# Patient Record
Sex: Male | Born: 1955 | Race: White | Hispanic: No
Health system: Midwestern US, Community
[De-identification: ages and names within clinical notes are randomized; demographics above are authoritative.]

## PROBLEM LIST (undated history)

## (undated) DIAGNOSIS — E669 Obesity, unspecified: Secondary | ICD-10-CM

## (undated) DIAGNOSIS — S92919A Unspecified fracture of unspecified toe(s), initial encounter for closed fracture: Secondary | ICD-10-CM

## (undated) DIAGNOSIS — E119 Type 2 diabetes mellitus without complications: Secondary | ICD-10-CM

## (undated) DIAGNOSIS — E782 Mixed hyperlipidemia: Secondary | ICD-10-CM

## (undated) DIAGNOSIS — E1169 Type 2 diabetes mellitus with other specified complication: Secondary | ICD-10-CM

## (undated) HISTORY — DX: Mixed hyperlipidemia: E78.2

## (undated) HISTORY — DX: Type 2 diabetes mellitus with other specified complication: E11.69

## (undated) HISTORY — DX: Unspecified fracture of unspecified toe(s), initial encounter for closed fracture: S92.919A

## (undated) HISTORY — DX: Obesity, unspecified: E66.9

## (undated) HISTORY — DX: Type 2 diabetes mellitus without complications: E11.9

## (undated) HISTORY — PX: COLONOSCOPY: SHX174

---

## 2010-03-01 HISTORY — PX: HERNIA REPAIR: SHX51

## 2011-01-18 NOTE — Progress Notes (Signed)
Subjective:      Patient ID: Joseph Richardson is a 55 y.o. male.    HPI  Patient is a 55 y.o. male seen at the request of himself for evaluation of hemorrhoids and umbilical hernia  Patient does not have rectal pain. The pain has been present for 2 week(s). The pain has not been worsening. He does not have pain with a BM.  Patient does not have blood in his stool.  Bowel movements are hard  Patient does feel a mass.  The patient has not been treated with topical agents, suppositories and stool softeners.  There is not a history of rectal trauma.  The patient does not have a family history of rectal polyps or cancer.  The symptomsdo not effect everyday activities.  Patient is a 55 y.o. male  who also has an umbilical hernia. The has been present 5 year(s). . The patient has pain. Pain is rated as a 2  It's size is increasing. The pain is stable. Patient does not have nausea/vomiting. The hernia mass is not reducible. The hernia does not effect normal everyday activities. The patient has had no abdominal surgery. A CAT scan was not done.      Review of Systems   Constitutional: Negative.  Negative for activity change, appetite change and unexpected weight change.   HENT: Negative.  Negative for nosebleeds, congestion, rhinorrhea, sneezing and postnasal drip.    Eyes: Negative.  Negative for visual disturbance.   Respiratory: Negative.  Negative for chest tightness and shortness of breath.    Cardiovascular: Negative.  Negative for chest pain and leg swelling.   Gastrointestinal: Negative.  Negative for abdominal pain, constipation, blood in stool and abdominal distention.   Genitourinary: Negative.  Negative for difficulty urinating.   Musculoskeletal: Negative.  Negative for myalgias, arthralgias and gait problem.   Skin: Negative for color change.   Neurological: Negative for dizziness, light-headedness, numbness and headaches.   Hematological: Negative.  Does not bruise/bleed easily.   Psychiatric/Behavioral:  Negative.  Negative for sleep disturbance.     Past Medical History   Diagnosis Date   . Sleep apnea      C PAP   . Tinea      Past Surgical History   Procedure Date   . Other surgical history      cyst left eye benign     History   Substance Use Topics   . Smoking status: Former Smoker     Quit date: 01/17/1986   . Smokeless tobacco: Not on file   . Alcohol Use: Not on file     History reviewed. No pertinent family history.  Review of patient's allergies indicates no known allergies.  Current Outpatient Prescriptions   Medication Sig Dispense Refill   . clotrimazole-betamethasone (LOTRISONE) cream        . UNABLE TO FIND C PAP MACHINE        . Garlic TABS Take  by mouth.         Chilton Si Tea, Camillia sinensis, (GREEN TEA EXTRACT) 90 % LIQD by Does not apply route.         . vitamin D (CHOLECALCIFEROL) 1000 UNIT TABS tablet Take 1,000 Units by mouth daily.         . ALPHA LIPOIC ACID PO Take  by mouth.         Marland Kitchen FISH OIL by Does not apply route.         . RED YEAST RICE by Does  not apply route.         . cyanocobalamin 1000 MCG/ML injection Inject 1,000 mcg into the muscle every 7 days.         Marland Kitchen VITAMIN E by Does not apply route.         Marland Kitchen UNABLE TO FIND WOBAZENE        . Amino Acids (AMINO ACID PO) Take  by mouth.         . meclizine (ANTIVERT) 25 MG tablet          BP 140/78  Pulse 84  Resp 20  Ht 6\' 1"  (1.854 m)  Wt 254 lb (115.214 kg)  BMI 33.51 kg/m2     Objective:   Physical Exam   Constitutional: He is oriented to person, place, and time. He appears well-developed and well-nourished.   HENT:   Head: Normocephalic.   Eyes: Pupils are equal, round, and reactive to light.   Neck: Normal range of motion.   Cardiovascular: Normal rate and normal heart sounds.    Pulmonary/Chest: Effort normal and breath sounds normal.   Abdominal: Soft. Bowel sounds are normal. A hernia (incarcerated umbilical hernia) is present.   Genitourinary: Penis normal. Rectal exam shows external hemorrhoid.   Musculoskeletal: Normal  range of motion.   Neurological: He is alert and oriented to person, place, and time.   Skin: Skin is warm.   Psychiatric: He has a normal mood and affect.       Assessment:      Umbilical hernia  External hemorrhoid      Plan:      Umbilical hernia repair    The options of therapy were discussed with the patient and He wants to proceed with the repair. The procedure of the repair with the probable use of mesh was discussed. The potential risks and complications including but not exclusive to infection, blood loss, chronic pain and recurrence were discussed. If any of these occur it could require another surgery. The expected recovery was discussed. It was be scheduled at His convenience.    Hemorrhoidectomy  I have discussed in great detail with the patient the options of therapy.The procedure of a hemorrhoidectomy as well as the potential risks and complications including but not exclusive to infection, blood loss, anal incontinence, anal seepage and recurrence were discussed. All of His questions were answered. He is agreeable to the procedure.

## 2011-01-26 LAB — BASIC METABOLIC PANEL
Anion Gap: 10 mEq/L (ref 7–13)
BUN: 23 mg/dL (ref 10–26)
CO2: 25 mEq/L (ref 22–32)
Calcium: 8.9 mg/dL (ref 8.5–10.2)
Chloride: 102 mEq/L (ref 99–111)
Creatinine: 1.06 mg/dL (ref 0.70–1.40)
GFR African American: 87.1
GFR Non-African American: 72
Glucose: 106 mg/dL (ref 60–115)
Potassium: 4.1 mEq/L (ref 3.5–5.5)
Sodium: 137 mEq/L (ref 135–146)

## 2011-01-26 LAB — CBC
Hematocrit: 41.1 % — ABNORMAL LOW (ref 42.0–52.0)
Hemoglobin: 13.7 g/dL — ABNORMAL LOW (ref 14.0–18.0)
MCH: 28.7 pg (ref 27.0–31.3)
MCHC: 33.5 % (ref 33.0–37.0)
MCV: 85.7 fL (ref 80.0–100.0)
MPV: 10.6 fL — ABNORMAL HIGH (ref 7.4–10.4)
Platelets: 165 10*3/uL (ref 130–400)
RBC: 4.79 10*6/uL (ref 4.70–6.10)
RDW: 14.1 % (ref 11.5–14.5)
WBC: 7.7 10*3/uL (ref 4.8–10.8)

## 2011-02-08 MED ORDER — OXYCODONE-ACETAMINOPHEN 5-325 MG PO TABS
5-325 MG | ORAL_TABLET | ORAL | Status: AC
Start: 2011-02-08 — End: 2011-02-15

## 2011-02-08 NOTE — Telephone Encounter (Signed)
done

## 2011-02-08 NOTE — Telephone Encounter (Signed)
Patient aware rx ready

## 2011-02-08 NOTE — Telephone Encounter (Signed)
rx did not print please re do

## 2011-02-08 NOTE — Telephone Encounter (Signed)
Patient requesting a refill on percocet

## 2011-02-11 MED ORDER — NYSTATIN 100000 UNIT/GM EX CREA
100000 UNIT/GM | CUTANEOUS | Status: DC
Start: 2011-02-11 — End: 2016-11-14

## 2011-02-11 NOTE — Progress Notes (Signed)
Subjective:      Patient ID: Joseph Richardson is a 55 y.o. male.    HPI  Joseph Richardson had a hemorrhoidectomy and umbilical hernia repair on 01/29/2011. The pain is rated as 4. Pain is improving. He has had soft bowel movements. Post operative bleeding is not present. He has returned to normal activities. He is concerned about his umbilical swelling.    Review of Systems    Objective:   Physical Exam   Constitutional: He is oriented to person, place, and time. He appears well-developed and well-nourished. No distress.   Cardiovascular: Normal rate.    Pulmonary/Chest: Effort normal.   Abdominal: Soft. Bowel sounds are normal.       Genitourinary:         Neurological: He is alert and oriented to person, place, and time.   Skin: Skin is warm.   Psychiatric: He has a normal mood and affect.       Assessment:      Satisfactory course       Plan:      The patient is instructed to return to see me in 1 month.

## 2011-02-15 MED ORDER — ZOLPIDEM TARTRATE 5 MG PO TABS
5 MG | ORAL_TABLET | ORAL | Status: DC
Start: 2011-02-15 — End: 2016-11-14

## 2011-02-15 NOTE — Telephone Encounter (Signed)
Patient states he is having trouble sleeping and that you told him you would give him something to help he would like this rx called to [facility] [phone]

## 2011-02-15 NOTE — Telephone Encounter (Signed)
rx called to pharm patient aware

## 2011-02-15 NOTE — Telephone Encounter (Signed)
done

## 2011-02-16 MED ORDER — LIDOCAINE (ANORECTAL) 5 % EX CREA
5 % | CUTANEOUS | Status: DC
Start: 2011-02-16 — End: 2016-11-14

## 2011-02-16 NOTE — Telephone Encounter (Signed)
Patient would like a refill on lidocaine cream

## 2011-02-16 NOTE — Telephone Encounter (Signed)
Done

## 2011-02-17 NOTE — Telephone Encounter (Signed)
Patient aware

## 2011-03-09 NOTE — Telephone Encounter (Signed)
Patient would like to know if it is ok for him to start exercising again "doing things like sit ups"? Please advise.

## 2011-03-10 NOTE — Telephone Encounter (Signed)
yes

## 2011-03-10 NOTE — Telephone Encounter (Signed)
Patient aware

## 2014-02-25 DIAGNOSIS — K5730 Diverticulosis of large intestine without perforation or abscess without bleeding: Secondary | ICD-10-CM | POA: Insufficient documentation

## 2016-11-12 ENCOUNTER — Emergency Department: Admit: 2016-11-12 | Payer: PRIVATE HEALTH INSURANCE

## 2016-11-12 ENCOUNTER — Inpatient Hospital Stay
Admission: EM | Admit: 2016-11-12 | Discharge: 2016-11-14 | Disposition: A | Discharge status: Home / Self Care | Payer: PRIVATE HEALTH INSURANCE | Source: Other Acute Inpatient Hospital | Admitting: Internal Medicine

## 2016-11-12 DIAGNOSIS — L03116 Cellulitis of left lower limb: Principal | ICD-10-CM

## 2016-11-12 LAB — CBC WITH AUTO DIFFERENTIAL
Basophils %: 0.6 %
Basophils Absolute: 0.1 10*3/uL (ref 0.0–0.2)
Eosinophils %: 0.6 %
Eosinophils Absolute: 0.1 10*3/uL (ref 0.0–0.7)
Hematocrit: 43.9 % (ref 42.0–52.0)
Hemoglobin: 15.2 g/dL (ref 14.0–18.0)
Lymphocytes %: 7.6 %
Lymphocytes Absolute: 0.9 10*3/uL — ABNORMAL LOW (ref 1.0–4.8)
MCH: 29.1 pg (ref 27.0–31.3)
MCHC: 34.6 % (ref 33.0–37.0)
MCV: 84.2 fL (ref 80.0–100.0)
Monocytes %: 8.5 %
Monocytes Absolute: 1 10*3/uL — ABNORMAL HIGH (ref 0.2–0.8)
Neutrophils %: 82.7 %
Neutrophils Absolute: 9.6 10*3/uL — ABNORMAL HIGH (ref 1.4–6.5)
Platelets: 146 10*3/uL (ref 130–400)
RBC: 5.22 M/uL (ref 4.70–6.10)
RDW: 13.8 % (ref 11.5–14.5)
WBC: 11.6 10*3/uL — ABNORMAL HIGH (ref 4.8–10.8)

## 2016-11-12 LAB — COMPREHENSIVE METABOLIC PANEL
ALT: 26 U/L (ref 0–41)
AST: 15 U/L (ref 0–40)
Albumin: 4.5 g/dL (ref 3.9–4.9)
Alkaline Phosphatase: 64 U/L (ref 35–104)
Anion Gap: 15 mEq/L — ABNORMAL HIGH (ref 7–13)
BUN: 19 mg/dL (ref 8–23)
CO2: 21 mEq/L — ABNORMAL LOW (ref 22–29)
Calcium: 9.1 mg/dL (ref 8.6–10.2)
Chloride: 94 mEq/L — ABNORMAL LOW (ref 98–107)
Creatinine: 1.16 mg/dL (ref 0.70–1.20)
GFR African American: >60 (ref >60)
GFR Non-African American: >60 (ref >60)
Globulin: 2.5 g/dL (ref 2.3–3.5)
Glucose: 486 mg/dL (ref 74–109)
Potassium: 4.3 mEq/L (ref 3.5–5.1)
Sodium: 130 mEq/L — ABNORMAL LOW (ref 132–144)
Total Bilirubin: 1.4 mg/dL — ABNORMAL HIGH (ref 0.0–1.2)
Total Protein: 7 g/dL (ref 6.4–8.1)

## 2016-11-12 LAB — POCT VENOUS
Base Excess, Ven: -3 (ref <=3)
Calcium, Ionized: 1.08 mmol/L — ABNORMAL LOW (ref 1.12–1.32)
FIO2: 21
GFR African American: >60 (ref >60)
GFR Non-African American: 51 — AB (ref >60)
HCO3, Venous: 22.2 mmol/L — ABNORMAL LOW (ref 23.0–29.0)
Hemoglobin: 15.8 gm/dL (ref 13.5–17.5)
Lactate: 3.75 mmol/L — ABNORMAL HIGH (ref 0.40–2.00)
O2 Sat, Ven: 95 %
POC Chloride: 97 mEq/L — ABNORMAL LOW (ref 99–110)
POC Creatinine: 1.4 mg/dL — ABNORMAL HIGH (ref 0.8–1.3)
POC Glucose: 476 mg/dl — ABNORMAL HIGH (ref 60–115)
POC Hematocrit: 46 % (ref 41–53)
POC Potassium: 4 mEq/L (ref 3.5–5.1)
POC Sodium: 132 mEq/L — ABNORMAL LOW (ref 136–145)
TC02 (Calc), Ven: 23 mmol/L
pCO2, Ven: 35.7 mm Hg — ABNORMAL LOW (ref 40.0–50.0)
pH, Ven: 7.402 (ref 7.350–7.450)
pO2, Ven: 76 mm Hg

## 2016-11-12 LAB — URINALYSIS WITH REFLEX TO CULTURE
Bilirubin Urine: NEGATIVE
Blood, Urine: NEGATIVE
Glucose, Ur: >=1000 mg/dL — AB
Ketones, Urine: NEGATIVE mg/dL
Leukocyte Esterase, Urine: NEGATIVE
Nitrite, Urine: NEGATIVE
Protein, UA: NEGATIVE mg/dL
Specific Gravity, UA: 1.03 (ref 1.005–1.030)
Urobilinogen, Urine: 0.2 E.U./dL (ref <2.0)
pH, UA: 5 (ref 5.0–9.0)

## 2016-11-12 LAB — PROTIME-INR
INR: 1
Protime: 10.1 s (ref 9.6–12.3)

## 2016-11-12 LAB — BETA-HYDROXYBUTYRATE: Beta-Hydroxybutyrate: 3.5 mg/dL — ABNORMAL HIGH (ref 0.2–2.8)

## 2016-11-12 LAB — APTT: aPTT: 27.3 s (ref 21.6–35.4)

## 2016-11-12 LAB — LACTIC ACID: Lactic Acid: 1.4 mmol/L (ref 0.5–2.2)

## 2016-11-12 MED ORDER — ENOXAPARIN SODIUM 120 MG/0.8ML SC SOLN
120 MG/0.8ML | Freq: Once | SUBCUTANEOUS | Status: AC
Start: 2016-11-12 — End: 2016-11-12
  Administered 2016-11-12: 23:00:00 120 mg/kg via SUBCUTANEOUS

## 2016-11-12 MED ORDER — CEFTRIAXONE SODIUM 1 G IJ SOLR
1 g | Freq: Once | INTRAMUSCULAR | Status: AC
Start: 2016-11-12 — End: 2016-11-12
  Administered 2016-11-12: 23:00:00 1 g via INTRAVENOUS

## 2016-11-12 MED ORDER — SODIUM CHLORIDE 0.9 % IV BOLUS
0.9 % | Freq: Once | INTRAVENOUS | Status: DC
Start: 2016-11-12 — End: 2016-11-14

## 2016-11-12 MED ORDER — ACETAMINOPHEN 500 MG PO TABS
500 MG | Freq: Once | ORAL | Status: AC
Start: 2016-11-12 — End: 2016-11-12
  Administered 2016-11-12: 20:00:00 1000 mg via ORAL

## 2016-11-12 MED ORDER — SODIUM CHLORIDE 0.9 % IV BOLUS
0.9 % | Freq: Once | INTRAVENOUS | Status: AC
Start: 2016-11-12 — End: 2016-11-13
  Administered 2016-11-12 (×2): 1000 mL via INTRAVENOUS

## 2016-11-12 MED ORDER — INSULIN REGULAR HUMAN 100 UNIT/ML IJ SOLN
100 UNIT/ML | Freq: Once | INTRAMUSCULAR | Status: AC
Start: 2016-11-12 — End: 2016-11-12
  Administered 2016-11-12: 22:00:00 12 [IU] via INTRAVENOUS

## 2016-11-12 MED FILL — ENOXAPARIN SODIUM 120 MG/0.8ML SC SOLN: 120 MG/0.8ML | SUBCUTANEOUS | Qty: 0.8

## 2016-11-12 MED FILL — CEFTRIAXONE SODIUM 1 G IJ SOLR: 1 g | INTRAMUSCULAR | Qty: 1

## 2016-11-12 MED FILL — MAPAP 500 MG PO TABS: 500 MG | ORAL | Qty: 2

## 2016-11-12 MED FILL — SODIUM CHLORIDE 0.9 % IV SOLN: 0.9 % | INTRAVENOUS | Qty: 1000

## 2016-11-12 NOTE — Progress Notes (Signed)
Report called report to [facility]

## 2016-11-12 NOTE — ED Notes (Signed)
To x-ray via cart.No acute distress noted.      Joseph Richardson  11/12/16 1900

## 2016-11-12 NOTE — Progress Notes (Signed)
Patient placed on CPAP at this time.

## 2016-11-12 NOTE — Progress Notes (Signed)
Notified lab concerning blood culture second draw and lactic acid

## 2016-11-12 NOTE — H&P (Signed)
Hospital Medicine History & Physical      PCP: No primary care provider on file.    Date of Admission: 11/12/2016    Date of Service: 11/12/16      Chief Complaint:  Left leg pain and swelling      History Of Present Illness:  61 y.o. male who [facility] for complaints of left lower extremity pain and swelling. [place] from [place] and drove to [place]. He states that he became concerned of having a possible blood clot after traveling and experiencing the pain. While in the ED he was found to have a low grade fever and to be tachycardic at times. The patient was also found to have an elevated blood sugar in the ED, but states that he ate a bagel with all the bad stuff before coming in. Denies cp sob ha rash anx n v d    Past Medical History:          Diagnosis Date   . Diabetes mellitus (HCC)    . Sleep apnea     C PAP   . Tinea        Past Surgical History:          Procedure Laterality Date   . HEMORRHOID SURGERY  01/29/11    excision of external hemorrhoid   . HERNIA REPAIR  01/29/11    Umbilical Hernia   . INGUINAL HERNIA REPAIR Bilateral    . OTHER SURGICAL HISTORY      cyst left eye benign       Medications Prior to Admission:      Prior to Admission medications    Medication Sig Start Date End Date Taking? Authorizing Provider   METFORMIN HCL PO Take by mouth 2 times daily   Yes Historical Provider, MD   Chilton SiGreen Tea, Camillia sinensis, (GREEN TEA EXTRACT) 90 % LIQD by Does not apply route.     Yes Historical Provider, MD   FISH OIL by Does not apply route.     Yes Historical Provider, MD   Lidocaine, Anorectal, 5 % CREA APPLY TID PRN 02/16/11   Janace HoardJames Andrasko, MD   zolpidem (AMBIEN) 5 MG tablet 1 po qhs prn sleep 02/15/11   Janace HoardJames Andrasko, MD   nystatin (MYCOSTATIN) cream Apply topically 2 times daily. 02/11/11   Janace HoardJames Andrasko, MD   clotrimazole-betamethasone Thurmond Butts(LOTRISONE) cream  01/06/11   Historical Provider, MD   meclizine (ANTIVERT) 25 MG tablet  01/11/11   Historical Provider, MD    UNABLE TO FIND C PAP MACHINE     Historical Provider, MD   Garlic TABS Take  by mouth.      Historical Provider, MD   vitamin D (CHOLECALCIFEROL) 1000 UNIT TABS tablet Take 1,000 Units by mouth daily.      Historical Provider, MD   ALPHA LIPOIC ACID PO Take  by mouth.      Historical Provider, MD   RED YEAST RICE by Does not apply route.      Historical Provider, MD   cyanocobalamin 1000 MCG/ML injection Inject 1,000 mcg into the muscle every 7 days.      Historical Provider, MD   VITAMIN E by Does not apply route.      Historical Provider, MD   [facility]     Historical Provider, MD   Amino Acids (AMINO ACID PO) Take  by mouth.      Historical Provider, MD  Allergies:  Patient has no known allergies.    Social History:      The patient currently lives home    TOBACCO:   reports that he quit smoking about 30 years ago. He has never used smokeless tobacco.  ETOH:   reports that he does not drink alcohol.      Family History:      Positive as follows: htn        REVIEW OF SYSTEMS:   Pertinent positives as noted in the HPI. All other systems reviewed and negative.    PHYSICAL EXAM:    BP (!) 148/79   Pulse 77   Temp 99 F (37.2 C)   Resp 18   Ht  (1.854 m)   Wt 270 lb (122.5 kg)   SpO2 95%   BMI 35.62 kg/m     General appearance:  No apparent distress, appears stated age and cooperative.  HEENT:  Normal cephalic, atraumatic without obvious deformity. Pupils equal, round, and reactive to light.  Extra ocular muscles intact. Conjunctivae/corneas clear.  Neck: Supple, with full range of motion. No jugular venous distention. Trachea midline.  Respiratory:  Normal respiratory effort. Clear to auscultation, bilaterally without Rales/Wheezes/Rhonchi.  Cardiovascular:  Regular rate and rhythm with normal S1/S2 without murmurs, rubs or gallops.  Abdomen: Soft, non-tender, non-distended with normal bowel sounds.  Musculoskeletal:  No clubbing, cyanosis or edema bilaterally.  Full range of motion  without deformity.  Skin: Skin color, texture, turgor normal.  No rashes or lesions.  Neurologic:  Neurovascularly intact without any focal sensory/motor deficits. Cranial nerves: II-XII intact, grossly non-focal.  Psychiatric:  Alert and oriented, thought content appropriate, normal insight  Capillary Refill: Brisk,< 3 seconds   Peripheral Pulses: +2 palpable, equal bilaterally       Labs:     Recent Labs      11/12/16   1600  11/12/16   1932   WBC  11.6*   --    HGB  15.2  15.8   HCT  43.9   --    PLT  146   --      Recent Labs      11/12/16   1600  11/12/16   1932   NA  130*   --    K  4.3   --    CL  94*   --    CO2  21*   --    BUN  19   --    CREATININE  1.16  1.4*   CALCIUM  9.1   --      Recent Labs      11/12/16   1600   AST  15   ALT  26   BILITOT  1.4*   ALKPHOS  64     Recent Labs      11/12/16   1639   INR  1.0     No results for input(s): CKTOTAL, TROPONINI in the last 72 hours.    Urinalysis:      Lab Results   Component Value Date    NITRU Negative 11/12/2016    BLOODU Negative 11/12/2016    SPECGRAV 1.030 11/12/2016    GLUCOSEU >=1000 11/12/2016       Radiology:     CXR: I have reviewed the CXR with the following interpretation: pending  EKG:  I have reviewed the EKG with the following interpretation: pending    XR CHEST STANDARD (2 VW)   Final  Result      No acute intrathoracic process.      [place] DUP LOWER EXTREMITY LEFT VEN   Final Result   NO SONOGRAPHIC EVIDENCE OF DEEP VENOUS THROMBOSIS WITHIN THE  LEFT  LOWER EXTREMITY.              ASSESSMENT:    Active Hospital Problems    Diagnosis Date Noted   . Hyperglycemia [R73.9] 11/12/2016       PLAN:        DVT Prophylaxis: lovenox see dosing  Diet:    Code Status: No Order    PT/OT Eval Status: eval and tx    1. Left leg edema - D-dimer and DVT US negative. Not DVT. Presented with erythema in the lateral aspect close to the ankle and posterior. He had a fever of 102F. Likely cellulitis. Will start empiric antibiotics. Will consult ID and follow up BG    2. Lactic acidosis - will continue IVF and monitor    3. Hyperglycemia-will place on accuchecks with ss coverage, dm diet. Will check A1C and consult endocrinology   4. Hyponatremia-Due to hyperglycemia - pseudohyponatremia   5. Leukocytosis - likely due to the cellulitis. Will monitor and continue antibiotics.  6. Elevated total bilirubin - will check direct and indirect. Will do [place] if needed.        Maceo Pro, PA    Thank you No primary care provider on file. for the opportunity to be involved in this patient's care. If you have any questions or concerns please feel free to contact me.     Attending's Note:  Pt was seen and evaluated and discussed care with PA.    Assessment and plan was corrected.    Florence Canner, MD  [facility] Medicine

## 2016-11-12 NOTE — ED Provider Notes (Signed)
[facility] ED  eMERGENCY dEPARTMENT eNCOUnter      Pt Name: Joseph Richardson  MRN: [id number]  Birthdate 1955-06-24  Date of evaluation: 11/12/2016  Provider: Wallace Cullens, APRN - CNP      HISTORY OF PRESENT ILLNESS    Broderick Fonseca is a 61 y.o. male who presents to the Emergency Department with L L leg pain x 1 day.  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  He denies any injury or trauma.  He also has been feeling very thirsty and having urinary frequency.  He is a type 2 diabetic and is taking Metformin.  Otherwise states he has no other complaints at this time.         REVIEW OF SYSTEMS       Review of Systems   Constitutional: Negative for activity change, appetite change and fever.   HENT: Negative for congestion, ear pain and sore throat.    Respiratory: Negative for cough and shortness of breath.    Cardiovascular: Negative for chest pain.   Gastrointestinal: Negative for abdominal pain, diarrhea, nausea and vomiting.   Endocrine: Positive for polydipsia and polyphagia.   Genitourinary: Negative for dysuria.   Musculoskeletal: Negative for arthralgias and back pain.        LLE pain, swelling, tenderness.   Skin: Negative for rash.   All other systems reviewed and are negative.        PAST MEDICAL HISTORY     Past Medical History:   Diagnosis Date   . Diabetes mellitus (HCC)    . Sleep apnea     C PAP   . Tinea          SURGICAL HISTORY       Past Surgical History:   Procedure Laterality Date   . HEMORRHOID SURGERY  01/29/11    excision of external hemorrhoid   . HERNIA REPAIR  01/29/11    Umbilical Hernia   . INGUINAL HERNIA REPAIR Bilateral    . OTHER SURGICAL HISTORY      cyst left eye benign         CURRENT MEDICATIONS       Previous Medications    ALPHA LIPOIC ACID PO    Take  by mouth.      AMINO ACIDS (AMINO ACID PO)    Take  by mouth.      CLOTRIMAZOLE-BETAMETHASONE (LOTRISONE) CREAM        CYANOCOBALAMIN 1000 MCG/ML INJECTION    Inject 1,000 mcg  into the muscle every 7 days.      FISH OIL    by Does not apply route.      GARLIC TABS    Take  by mouth.      GREEN TEA, CAMILLIA SINENSIS, (GREEN TEA EXTRACT) 90 % LIQD    by Does not apply route.      LIDOCAINE, ANORECTAL, 5 % CREA    APPLY TID PRN    MECLIZINE (ANTIVERT) 25 MG TABLET        METFORMIN HCL PO    Take by mouth 2 times daily    NYSTATIN (MYCOSTATIN) CREAM    Apply topically 2 times daily.    RED YEAST RICE    by Does not apply route.      UNABLE TO FIND    C PAP MACHINE     UNABLE TO FIND    WOBAZENE     VITAMIN D (CHOLECALCIFEROL)  1000 UNIT TABS TABLET    Take 1,000 Units by mouth daily.      VITAMIN E    by Does not apply route.      ZOLPIDEM (AMBIEN) 5 MG TABLET    1 po qhs prn sleep       ALLERGIES     Patient has no known allergies.    FAMILY HISTORY     History reviewed. No pertinent family history.       SOCIAL HISTORY       Social History     Social History   . Marital status: Divorced     Spouse name: N/A   . Number of children: N/A   . Years of education: N/A     Social History Main Topics   . Smoking status: Former Smoker     Quit date: 01/17/1986   . Smokeless tobacco: Never Used   . Alcohol use No   . Drug use: No   . Sexual activity: Not Asked     Other Topics Concern   . None     Social History Narrative   . None       SCREENINGS             PHYSICAL EXAM    (up to 7 for level 4, 8 or more for level 5)     ED Triage Vitals [11/12/16 1539]   BP Temp Temp Source Pulse Resp SpO2 Height Weight   (!) 155/95 102 F (38.9 C) Oral 101 18 95 %  (1.854 m) 270 lb (122.5 kg)       Physical Exam   Constitutional: He is oriented to person, place, and time. He appears well-developed and well-nourished.   HENT:   Head: Normocephalic and atraumatic.   Right Ear: External ear normal.   Left Ear: External ear normal.   Mouth/Throat: Oropharynx is clear and moist.   Eyes: Pupils are equal, round, and reactive to light. Conjunctivae and EOM are normal.   Neck: Normal range of motion. Neck supple.    Cardiovascular: Normal rate and regular rhythm.    Pulmonary/Chest: Effort normal and breath sounds normal. He has no decreased breath sounds. He has no wheezes.   Abdominal: Soft. Bowel sounds are normal. He exhibits no distension. There is no tenderness.   Musculoskeletal: Normal range of motion.        Left lower leg: He exhibits tenderness and swelling.        Legs:  Neurological: He is alert and oriented to person, place, and time. He has normal reflexes.   Skin: Skin is warm and dry.   Psychiatric: Judgment normal.   Nursing note and vitals reviewed.        All other labs were within normal range or not returned as of this dictation.    EMERGENCY DEPARTMENT COURSE and DIFFERENTIAL DIAGNOSIS/MDM:   Vitals:    Vitals:    11/12/16 1539 11/12/16 1735   BP: (!) 155/95 (!) 148/79   Pulse: 101 77   Resp: 18 18   Temp: 102 F (38.9 C) 99 F (37.2 C)   TempSrc: Oral    SpO2: 95% 95%   Weight: 270 lb (122.5 kg)    Height:  (1.854 m)             61 yr old male with hyperglycemia, pyrexia and leukocytosis.  He will be admitted for observation to r/o DVT.  He was started on Lovenox in the  ER.  His hyperglycemis was treated and antibiotics were started.  Patient is comfortable in the room at time of admission.  Patient was seen by Dr. Glenna Durand and plan of care and disposition were agreed upon.         PROCEDURES:  Unless otherwise noted below, none     Procedures      FINAL IMPRESSION      1. Hyperglycemia    2. Fever in adult    3. Leukocytosis, unspecified type          DISPOSITION/PLAN   DISPOSITION Admitted To Observation 11/12/2016 07:59:58 PM          Wallace Cullens, APRN - CNP (electronically signed)  Attending Emergency Physician       Wallace Cullens, APRN - CNP  11/12/16 2024

## 2016-11-12 NOTE — ED Notes (Signed)
1 touch done by this ED Tech. 1 touch is 307 Deanna RN was notified.      Burt KnackScott Valor Quaintance  11/12/16 2125

## 2016-11-13 LAB — BASIC METABOLIC PANEL W/ REFLEX TO MG FOR LOW K
Anion Gap: 11 mEq/L (ref 7–13)
BUN: 14 mg/dL (ref 8–23)
CO2: 24 mEq/L (ref 22–29)
Calcium: 7.9 mg/dL — ABNORMAL LOW (ref 8.6–10.2)
Chloride: 99 mEq/L (ref 98–107)
Creatinine: 1.02 mg/dL (ref 0.70–1.20)
GFR African American: >60 (ref >60)
GFR Non-African American: >60 (ref >60)
Glucose: 270 mg/dL — ABNORMAL HIGH (ref 74–109)
Potassium reflex Magnesium: 4.1 mEq/L (ref 3.5–5.1)
Sodium: 134 mEq/L (ref 132–144)

## 2016-11-13 LAB — CBC WITH AUTO DIFFERENTIAL
Basophils %: 0.6 %
Basophils Absolute: 0 10*3/uL (ref 0.0–0.2)
Eosinophils %: 2 %
Eosinophils Absolute: 0.1 10*3/uL (ref 0.0–0.7)
Hematocrit: 38.1 % — ABNORMAL LOW (ref 42.0–52.0)
Hemoglobin: 13.1 g/dL — ABNORMAL LOW (ref 14.0–18.0)
Lymphocytes %: 14.8 %
Lymphocytes Absolute: 1.1 10*3/uL (ref 1.0–4.8)
MCH: 29.2 pg (ref 27.0–31.3)
MCHC: 34.3 % (ref 33.0–37.0)
MCV: 85.1 fL (ref 80.0–100.0)
Monocytes %: 10.9 %
Monocytes Absolute: 0.8 10*3/uL (ref 0.2–0.8)
Neutrophils %: 71.7 %
Neutrophils Absolute: 5.5 10*3/uL (ref 1.4–6.5)
Platelets: 122 10*3/uL — ABNORMAL LOW (ref 130–400)
RBC: 4.48 M/uL — ABNORMAL LOW (ref 4.70–6.10)
RDW: 13.9 % (ref 11.5–14.5)
WBC: 7.7 10*3/uL (ref 4.8–10.8)

## 2016-11-13 LAB — LACTIC ACID: Lactic Acid: 0.9 mmol/L (ref 0.5–2.2)

## 2016-11-13 LAB — POCT GLUCOSE
Glucose: 307 mg/dL
POC Glucose: 307 mg/dl — ABNORMAL HIGH (ref 60–115)
POC Glucose: 241 mg/dl — ABNORMAL HIGH (ref 60–115)
POC Glucose: 281 mg/dl — ABNORMAL HIGH (ref 60–115)
POC Glucose: 280 mg/dl — ABNORMAL HIGH (ref 60–115)

## 2016-11-13 LAB — HEMOGLOBIN A1C: Hemoglobin A1C: 12 % — ABNORMAL HIGH (ref 4.8–5.9)

## 2016-11-13 LAB — D-DIMER, QUANTITATIVE: D-Dimer, Quant: 0.31 mg/L FEU (ref 0.00–0.50)

## 2016-11-13 MED ORDER — VANCOMYCIN INTERMITTENT DOSING (PLACEHOLDER)
INTRAVENOUS | Status: DC
Start: 2016-11-13 — End: 2016-11-13

## 2016-11-13 MED ORDER — MAGNESIUM HYDROXIDE 400 MG/5ML PO SUSP
400 MG/5ML | Freq: Every day | ORAL | Status: DC | PRN
Start: 2016-11-13 — End: 2016-11-14

## 2016-11-13 MED ORDER — DEXTROSE 50 % IV SOLN
50 % | INTRAVENOUS | Status: DC | PRN
Start: 2016-11-13 — End: 2016-11-14

## 2016-11-13 MED ORDER — NORMAL SALINE FLUSH 0.9 % IV SOLN
0.9 % | INTRAVENOUS | Status: DC | PRN
Start: 2016-11-13 — End: 2016-11-14

## 2016-11-13 MED ORDER — GLUCOSE 40 % PO GEL
40 % | ORAL | Status: DC | PRN
Start: 2016-11-13 — End: 2016-11-14

## 2016-11-13 MED ORDER — DEXTROSE 5 % IV SOLN
5 % | Freq: Two times a day (BID) | INTRAVENOUS | Status: DC
Start: 2016-11-13 — End: 2016-11-13
  Administered 2016-11-13 (×2): 1500 mg/kg via INTRAVENOUS

## 2016-11-13 MED ORDER — DEXTROSE 5 % IV SOLN
5 % | INTRAVENOUS | Status: DC | PRN
Start: 2016-11-13 — End: 2016-11-14

## 2016-11-13 MED ORDER — IBUPROFEN 400 MG PO TABS
400 MG | Freq: Four times a day (QID) | ORAL | Status: DC | PRN
Start: 2016-11-13 — End: 2016-11-14
  Administered 2016-11-13 – 2016-11-14 (×2): 400 mg via ORAL

## 2016-11-13 MED ORDER — ONDANSETRON HCL 4 MG/2ML IJ SOLN
4 MG/2ML | Freq: Four times a day (QID) | INTRAMUSCULAR | Status: DC | PRN
Start: 2016-11-13 — End: 2016-11-14

## 2016-11-13 MED ORDER — GLUCAGON HCL RDNA (DIAGNOSTIC) 1 MG IJ SOLR
1 MG | INTRAMUSCULAR | Status: DC | PRN
Start: 2016-11-13 — End: 2016-11-14

## 2016-11-13 MED ORDER — SODIUM CHLORIDE 0.9 % IV SOLN
0.9 % | INTRAVENOUS | Status: DC
Start: 2016-11-13 — End: 2016-11-14
  Administered 2016-11-13 – 2016-11-14 (×2): via INTRAVENOUS

## 2016-11-13 MED ORDER — METFORMIN HCL 500 MG PO TABS
500 MG | Freq: Two times a day (BID) | ORAL | Status: DC
Start: 2016-11-13 — End: 2016-11-14
  Administered 2016-11-13 – 2016-11-14 (×2): 500 mg via ORAL

## 2016-11-13 MED ORDER — CEFTRIAXONE SODIUM 1 G IJ SOLR
1 g | INTRAMUSCULAR | Status: DC
Start: 2016-11-13 — End: 2016-11-12

## 2016-11-13 MED ORDER — PIPERACILLIN SOD-TAZOBACTAM SO 3.375 (3-0.375) G IV SOLR
3.375 (3-0.375) g | Freq: Three times a day (TID) | INTRAVENOUS | Status: DC
Start: 2016-11-13 — End: 2016-11-13
  Administered 2016-11-13 (×3): 3.375 g via INTRAVENOUS

## 2016-11-13 MED ORDER — INSULIN LISPRO 100 UNIT/ML SC SOLN
100 UNIT/ML | Freq: Three times a day (TID) | SUBCUTANEOUS | Status: DC
Start: 2016-11-13 — End: 2016-11-14
  Administered 2016-11-13: 12:00:00 4 [IU] via SUBCUTANEOUS
  Administered 2016-11-13 (×2): 6 [IU] via SUBCUTANEOUS
  Administered 2016-11-14: 12:00:00 4 [IU] via SUBCUTANEOUS
  Administered 2016-11-14 (×2): 6 [IU] via SUBCUTANEOUS

## 2016-11-13 MED ORDER — NORMAL SALINE FLUSH 0.9 % IV SOLN
0.9 % | Freq: Two times a day (BID) | INTRAVENOUS | Status: DC
Start: 2016-11-13 — End: 2016-11-14

## 2016-11-13 MED ORDER — ENOXAPARIN SODIUM 120 MG/0.8ML SC SOLN
120 MG/0.8ML | Freq: Two times a day (BID) | SUBCUTANEOUS | Status: DC
Start: 2016-11-13 — End: 2016-11-12

## 2016-11-13 MED ORDER — SODIUM CHLORIDE 0.9 % IV SOLN
0.9 % | INTRAVENOUS | Status: DC
Start: 2016-11-13 — End: 2016-11-12

## 2016-11-13 MED ORDER — INSULIN LISPRO 100 UNIT/ML SC SOLN
100 UNIT/ML | Freq: Every evening | SUBCUTANEOUS | Status: DC
Start: 2016-11-13 — End: 2016-11-14
  Administered 2016-11-13: 02:00:00 4 [IU] via SUBCUTANEOUS
  Administered 2016-11-14: 02:00:00 3 [IU] via SUBCUTANEOUS

## 2016-11-13 MED ORDER — IBUPROFEN 400 MG PO TABS
400 MG | Freq: Three times a day (TID) | ORAL | Status: DC | PRN
Start: 2016-11-13 — End: 2016-11-13
  Administered 2016-11-13: 12:00:00 400 mg via ORAL

## 2016-11-13 MED FILL — IBUPROFEN 400 MG PO TABS: 400 MG | ORAL | Qty: 1

## 2016-11-13 MED FILL — PIPERACILLIN SOD-TAZOBACTAM SO 3.375 (3-0.375) G IV SOLR: 3.375 (3-0.375) g | INTRAVENOUS | Qty: 3.38

## 2016-11-13 MED FILL — SODIUM CHLORIDE 0.9 % IV SOLN: 0.9 % | INTRAVENOUS | Qty: 1000

## 2016-11-13 MED FILL — VANCOMYCIN HCL 1000 MG IV SOLR: 1000 MG | INTRAVENOUS | Qty: 1500

## 2016-11-13 MED FILL — MONOJECT FLUSH SYRINGE 0.9 % IV SOLN: 0.9 % | INTRAVENOUS | Qty: 10

## 2016-11-13 MED FILL — VANCOMYCIN HCL 5000 MG IV SOLR: 5000 MG | INTRAVENOUS | Qty: 1500

## 2016-11-13 MED FILL — METFORMIN HCL 500 MG PO TABS: 500 MG | ORAL | Qty: 1

## 2016-11-13 NOTE — Consults (Signed)
Consults   Infectious Disease     Patient Name: Joseph Richardson  Date: 11/13/2016  Date of Birth: 07/18/1955  Medical Record Number: [id number]    Cellulitis left leg      History of Present Illness:    Patient presented to the hospital yesterday with left leg swelling pain  Fevers chills sweats ongoing  Sugar was elevated  No shortness of breath headache nausea vomiting diarrhea    Placed on ceftriaxone and vancomycin    Review of Systems   Constitutional: Negative.    HENT: Negative.    Respiratory: Negative.    Cardiovascular: Negative.    Gastrointestinal: Negative.    Musculoskeletal: Negative.    Skin: Negative.    Neurological: Negative.        Review of Systems: All 14 review of systems negative other than as stated above    Social History   Substance Use Topics   . Smoking status: Former Smoker     Quit date: 01/17/1986   . Smokeless tobacco: Never Used   . Alcohol use No         Past Medical History:   Diagnosis Date   . Diabetes mellitus (HCC)    . Sleep apnea     C PAP   . Tinea            Past Surgical History:   Procedure Laterality Date   . HEMORRHOID SURGERY  01/29/11    excision of external hemorrhoid   . HERNIA REPAIR  01/29/11    Umbilical Hernia   . INGUINAL HERNIA REPAIR Bilateral    . OTHER SURGICAL HISTORY      cyst left eye benign         No current facility-administered medications on file prior to encounter.      Current Outpatient Prescriptions on File Prior to Encounter   Medication Sig Dispense Refill   . Lidocaine, Anorectal, 5 % CREA APPLY TID PRN 1 Tube 3   . zolpidem (AMBIEN) 5 MG tablet 1 po qhs prn sleep 30 tablet 2   . nystatin (MYCOSTATIN) cream Apply topically 2 times daily. 1 Tube 3   . clotrimazole-betamethasone (LOTRISONE) cream      . meclizine (ANTIVERT) 25 MG tablet      . UNABLE TO FIND C PAP MACHINE      . Garlic TABS Take  by mouth.       Chilton Si Tea, Camillia sinensis, (GREEN TEA EXTRACT) 90 % LIQD by Does not apply route.       . vitamin D (CHOLECALCIFEROL) 1000 UNIT TABS  tablet Take 1,000 Units by mouth daily.       . ALPHA LIPOIC ACID PO Take  by mouth.       Marland Kitchen FISH OIL by Does not apply route.       . RED YEAST RICE by Does not apply route.       . cyanocobalamin 1000 MCG/ML injection Inject 1,000 mcg into the muscle every 7 days.       Marland Kitchen VITAMIN E by Does not apply route.       Marland Kitchen UNABLE TO FIND WOBAZENE      . Amino Acids (AMINO ACID PO) Take  by mouth.           No Known Allergies      History reviewed. No pertinent family history.      Physical Exam:     Blood pressure 126/66, pulse 64, temperature 99 F (  37.2 C), resp. rate 16, height 6\' 1"  (1.854 m), weight 270 lb (122.5 kg), SpO2 98 %.    Physical Exam   HENT:   Head: Normocephalic.   Right Ear: External ear normal.   Mouth/Throat: Oropharynx is clear and moist.   Neck: Normal range of motion. No JVD present. No tracheal deviation present. No thyromegaly present.   Cardiovascular: Normal rate, normal heart sounds and intact distal pulses.  Exam reveals no gallop.    No murmur heard.  Pulmonary/Chest: No respiratory distress. He has no wheezes. He has no rales. He exhibits no tenderness.   Abdominal: Bowel sounds are normal. He exhibits no distension and no mass. There is no hepatosplenomegaly, splenomegaly or hepatomegaly. There is no tenderness. There is no rebound, no guarding and no CVA tenderness.   Musculoskeletal: He exhibits edema and tenderness.   Lymphadenopathy:     He has no cervical adenopathy.     He has no axillary adenopathy.        Right: No inguinal, no supraclavicular and no epitrochlear adenopathy present.        Left: No inguinal, no supraclavicular and no epitrochlear adenopathy present.   Neurological: He is alert. He displays normal reflexes. No cranial nerve deficit. Coordination normal.   Skin: Skin is warm. Rash noted. There is erythema. No pallor.     Lab Results   Component Value Date    WBC 7.7 11/13/2016    HGB 13.1 (L) 11/13/2016    HCT 38.1 (L) 11/13/2016    MCV 85.1 11/13/2016    PLT 122 (L)  11/13/2016     Lab Results   Component Value Date    NA 134 11/13/2016    K 4.1 11/13/2016    CL 99 11/13/2016    CO2 24 11/13/2016    BUN 14 11/13/2016    CREATININE 1.02 11/13/2016    GLUCOSE 270 11/13/2016    CALCIUM 7.9 11/13/2016          Patient Active Problem List   Diagnosis   . Umbilical hernia   . External hemorrhoid, thrombosed   . Hyperglycemia         PLAN:    Cellulitis of left leg    Switch to ampicillin IV hopefully be able switch to oral amoxicillin tomorrow

## 2016-11-13 NOTE — Progress Notes (Signed)
Pharmacy Note  Vancomycin Consult    Joseph Richardson is a 61 y.o. male started on Vancomycin for cellulitis; consult received from Dr. Joette Catching to manage therapy. Also receiving the following antibiotics: Zosyn.    Patient Active Problem List   Diagnosis   . Umbilical hernia   . External hemorrhoid, thrombosed   . Hyperglycemia       Allergies:  Patient has no known allergies.     Temp max: 102F    Recent Labs      11/12/16   1600   BUN  19       Recent Labs      11/12/16   1600  11/12/16   1932   CREATININE  1.16  1.4*       Recent Labs      11/12/16   1600   WBC  11.6*       No intake or output data in the 24 hours ending 11/13/16 0006    Culture Date      Source                       Results  11/12/16              blood                           pending    Ht Readings from Last 1 Encounters:   11/12/16 6\' 1"  (1.854 m)        Wt Readings from Last 1 Encounters:   11/12/16 270 lb (122.5 kg)         Body mass index is 35.62 kg/m.    Estimated Creatinine Clearance: 75 mL/min (A) (based on SCr of 1.4 mg/dL (H)).    Goal Trough Level: 10-15 mcg/mL    Assessment/Plan:  Will initiate vancomycin 1500 mg IV every 12 hours based on adjusted body weight for BMI > 35. Trough prior to 4th dose 11/14/16 1200. Timing of trough level will be determined based on culture results, renal function, and clinical response.     Thank you for the consult.  Will continue to follow.    Burlene Arnt, R.Ph.  11/13/2016  12:08 AM

## 2016-11-13 NOTE — Plan of Care (Signed)
Problem: Discharge Planning:  Goal: Discharged to appropriate level of care  Discharged to appropriate level of care   Outcome: Ongoing      Problem: Body Temperature - Imbalanced:  Goal: Ability to maintain a body temperature in the normal range will improve  Ability to maintain a body temperature in the normal range will improve   Outcome: Ongoing      Problem: Mobility - Impaired:  Goal: Mobility will improve to maximum level  Mobility will improve to maximum level   Outcome: Ongoing      Problem: Pain:  Goal: Pain level will decrease  Pain level will decrease   Outcome: Ongoing    Goal: Control of acute pain  Control of acute pain   Outcome: Ongoing    Goal: Control of chronic pain  Control of chronic pain   Outcome: Ongoing      Problem: Skin Integrity - Impaired:  Goal: Will show no infection signs and symptoms  Will show no infection signs and symptoms   Outcome: Ongoing    Goal: Absence of new skin breakdown  Absence of new skin breakdown   Outcome: Ongoing

## 2016-11-13 NOTE — Progress Notes (Signed)
Department of Internal Medicine  Progress Note      SUBJECTIVE: complains of pain in left leg  No acute events overnight.       ROS:  All 12 systems reviewed and negative except mentioned in HPI and Assessment and plan    MEDICATIONS:  Current Facility-Administered Medications   Medication Dose Route Frequency Provider Last Rate Last Dose   . ibuprofen (ADVIL;MOTRIN) tablet 400 mg  400 mg Oral Q6H PRN Tylene Quashie, MD       . 0.9 % sodium chloride bolus  1,000 mL Intravenous Once Wallace Cullens, APRN - CNP       . sodium chloride flush 0.9 % injection 10 mL  10 mL Intravenous 2 times per day Clayborne Artist Dobritch, PA       . sodium chloride flush 0.9 % injection 10 mL  10 mL Intravenous PRN Clayborne Artist [place], PA       . magnesium hydroxide (MILK OF MAGNESIA) 400 MG/5ML suspension 30 mL  30 mL Oral Daily PRN Clayborne Artist [place], PA       . ondansetron [facility]) injection 4 mg  4 mg Intravenous Q6H PRN Clayborne Artist Dobrit[place], PA       . insulin lispro (HUMALOG) injection vial 0-12 Units  0-12 Units Subcutaneous TID [facility] Clayborne Artist Dobritch, PA   6 Units at 11/13/16 1227   . insulin lispro (HUMALOG) injection vial 0-6 Units  0-6 Units Subcutaneous Nightly Clayborne Artist [place], [place]   4 Units at 11/12/16 2130   . glucose (GLUTOSE) 40 % oral gel 15 g  15 g Oral PRN Florence Canner, MD       . dextrose 50 % solution 12.5 g  12.5 g Intravenous PRN Florence Canner, MD       . glucagon (rDNA) injection 1 mg  1 mg Intramuscular PRN Florence Canner, MD       . dextrose 5 % solution  100 mL/hr Intravenous PRN Florence Canner, MD       . vancomycin (VANCOCIN) 1,500 mg in dextrose 5 % 500 mL IVPB  15 mg/kg (Adjusted) Intravenous Q12H Florence Canner, MD   Stopped at 11/13/16 0106   . 0.9 % sodium chloride infusion   Intravenous Continuous Florence Canner, MD 100 mL/hr at 11/12/16 2336     . piperacillin-tazobactam (ZOSYN) 3.375 g in dextrose 5 % 50 mL IVPB extended infusion (mini-bag)  3.375 g Intravenous Q8H Florence Canner, MD 12.5 mL/hr  at 11/13/16 1024 3.375 g at 11/13/16 1024   . vancomycin (VANCOCIN) intermittent dosing (placeholder)   Other RX Placeholder Florence Canner, MD             OBJECTIVE:  Vital Signs:  Vitals:    11/13/16 0759   BP: 126/66   Pulse: 64   Resp:    Temp: 99 F (37.2 C)   SpO2: 98%       Focal exam:  Left leg red and tender and warm around ankles    General Exam (except as mentioned above):  CONSTITUTIONAL: Awake, alert, no apparent distress  EYES:  PERRL, conjunctiva normal  ENT:  Normocephalic, atraumatic  NECK:  Supple  BACK:  Symmetric  LUNGS:  CTAB except bilateral basilar crackles.  CARDIOVASCULAR:  S1S2 present  ABDOMEN:  soft, non-distended, non-tender  MUSCULOSKELETAL:  There is no redness, warmth, or swelling of the joints.  NEUROLOGIC:  Alert, awake, oriented x 3. No FND  EXTREMITIES: Warm and well perfused.     LABS  Recent Labs      11/12/16   1600  11/12/16   1932  11/13/16   0647   WBC  11.6*   --   7.7   RBC  5.22   --   4.48*   HGB  15.2  15.8  13.1*   HCT  43.9   --   38.1*   MCV  84.2   --   85.1   MCH  29.1   --   29.2   MCHC  34.6   --   34.3   RDW  13.8   --   13.9   PLT  146   --   122*       Recent Labs      11/12/16   1600  11/12/16   1932  11/12/16   2123  11/13/16   0647   NA  130*   --    --   134   K  4.3   --    --   4.1   CL  94*   --    --   99   CO2  21*   --    --   24   BUN  19   --    --   14   CREATININE  1.16  1.4*   --   1.02   GLUCOSE  486*   --   307  270*   CALCIUM  9.1   --    --   7.9*       No results for input(s): MG in the last 72 hours.        ASSESSMENT AND PLAN    Active Hospital Problems    Diagnosis Date Noted   . Hyperglycemia [R73.9] 11/12/2016     - Lef leg cellulitis: On Zosyn and vancomycin. 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 States, he was started on metformin 1 month ago. Does not follow card controlled diet. On SSI . Restarted metformin      DVT prophylaxis: Lovenox  Bary Castilla, MD  Pager : [phone]

## 2016-11-13 NOTE — Plan of Care (Signed)
Problem: Body Temperature - Imbalanced:  Goal: Ability to maintain a body temperature in the normal range will improve  Ability to maintain a body temperature in the normal range will improve  Outcome: Ongoing

## 2016-11-13 NOTE — Progress Notes (Signed)
Patient admission complete.  Cpap ordered as patient states he "will not sleep without bipap".  Got bipap from respiratory as his is not here, was not fitting right.  Respiratory came up and adjusted, seems to be fine now.  Vitals stable, patient steady gate to stand up and use urinal.  Will continue to monitor.

## 2016-11-14 DIAGNOSIS — L03116 Cellulitis of left lower limb: Secondary | ICD-10-CM

## 2016-11-14 LAB — CBC WITH AUTO DIFFERENTIAL
Basophils %: 0.6 %
Basophils Absolute: 0 10*3/uL (ref 0.0–0.2)
Eosinophils %: 2.2 %
Eosinophils Absolute: 0.1 10*3/uL (ref 0.0–0.7)
Hematocrit: 37.1 % — ABNORMAL LOW (ref 42.0–52.0)
Hemoglobin: 12.7 g/dL — ABNORMAL LOW (ref 14.0–18.0)
Lymphocytes %: 17.4 %
Lymphocytes Absolute: 1.2 10*3/uL (ref 1.0–4.8)
MCH: 29.2 pg (ref 27.0–31.3)
MCHC: 34.1 % (ref 33.0–37.0)
MCV: 85.6 fL (ref 80.0–100.0)
Monocytes %: 9.2 %
Monocytes Absolute: 0.6 10*3/uL (ref 0.2–0.8)
Neutrophils %: 70.6 %
Neutrophils Absolute: 4.7 10*3/uL (ref 1.4–6.5)
Platelets: 116 10*3/uL — ABNORMAL LOW (ref 130–400)
RBC: 4.34 M/uL — ABNORMAL LOW (ref 4.70–6.10)
RDW: 13.6 % (ref 11.5–14.5)
WBC: 6.7 10*3/uL (ref 4.8–10.8)

## 2016-11-14 LAB — BASIC METABOLIC PANEL W/ REFLEX TO MG FOR LOW K
Anion Gap: 12 mEq/L (ref 7–13)
BUN: 13 mg/dL (ref 8–23)
CO2: 23 mEq/L (ref 22–29)
Calcium: 7.9 mg/dL — ABNORMAL LOW (ref 8.6–10.2)
Chloride: 102 mEq/L (ref 98–107)
Creatinine: 1.01 mg/dL (ref 0.70–1.20)
GFR African American: >60 (ref >60)
GFR Non-African American: >60 (ref >60)
Glucose: 230 mg/dL — ABNORMAL HIGH (ref 74–109)
Potassium reflex Magnesium: 4.2 mEq/L (ref 3.5–5.1)
Sodium: 137 mEq/L (ref 132–144)

## 2016-11-14 LAB — POCT GLUCOSE
POC Glucose: 285 mg/dl — ABNORMAL HIGH (ref 60–115)
POC Glucose: 235 mg/dl — ABNORMAL HIGH (ref 60–115)
POC Glucose: 272 mg/dl — ABNORMAL HIGH (ref 60–115)
POC Glucose: 268 mg/dl — ABNORMAL HIGH (ref 60–115)

## 2016-11-14 LAB — LACTIC ACID: Lactic Acid: 0.8 mmol/L (ref 0.5–2.2)

## 2016-11-14 MED ORDER — INSULIN GLARGINE 100 UNIT/ML SC SOLN
100 UNIT/ML | Freq: Every evening | SUBCUTANEOUS | Status: DC
Start: 2016-11-14 — End: 2016-11-14

## 2016-11-14 MED ORDER — MELATONIN 3 MG PO TABS
3 MG | Freq: Every evening | ORAL | Status: DC | PRN
Start: 2016-11-14 — End: 2016-11-14
  Administered 2016-11-14: 03:00:00 10 mg via ORAL

## 2016-11-14 MED ORDER — INSULIN LISPRO 100 UNIT/ML SC SOLN
100 UNIT/ML | Freq: Three times a day (TID) | SUBCUTANEOUS | Status: DC
Start: 2016-11-14 — End: 2016-11-14
  Administered 2016-11-14: 21:00:00 15 [IU] via SUBCUTANEOUS

## 2016-11-14 MED ORDER — INSULIN LISPRO 100 UNIT/ML SC SOPN
100 UNIT/ML | PEN_INJECTOR | Freq: Three times a day (TID) | SUBCUTANEOUS | 3 refills | Status: AC
Start: 2016-11-14 — End: ?

## 2016-11-14 MED ORDER — METFORMIN HCL 1000 MG PO TABS
1000 | ORAL_TABLET | Freq: Two times a day (BID) | ORAL | 0 refills | Status: AC
Start: 2016-11-14 — End: ?

## 2016-11-14 MED ORDER — AMPICILLIN SODIUM 2000 MG IJ SOLR (MIXTURES ONLY)
2 G | Freq: Four times a day (QID) | INTRAVENOUS | Status: DC
Start: 2016-11-14 — End: 2016-11-14
  Administered 2016-11-14 (×4): 2 g via INTRAVENOUS

## 2016-11-14 MED ORDER — BLOOD GLUCOSE TEST VI STRP
ORAL_STRIP | Freq: Four times a day (QID) | 0 refills | Status: AC
Start: 2016-11-14 — End: ?

## 2016-11-14 MED ORDER — LANCETS MISC
Freq: Four times a day (QID) | 3 refills | Status: AC
Start: 2016-11-14 — End: ?

## 2016-11-14 MED ORDER — INSULIN GLARGINE 100 UNIT/ML SC SOPN
100 UNIT/ML | PEN_INJECTOR | Freq: Every evening | SUBCUTANEOUS | 3 refills | Status: AC
Start: 2016-11-14 — End: ?

## 2016-11-14 MED ORDER — METFORMIN HCL 500 MG PO TABS
500 MG | Freq: Two times a day (BID) | ORAL | Status: DC
Start: 2016-11-14 — End: 2016-11-14
  Administered 2016-11-14: 21:00:00 1000 mg via ORAL

## 2016-11-14 MED ORDER — BLOOD GLUCOSE MONITORING SUPPL DEVI
PACK | Freq: Four times a day (QID) | 0 refills | Status: AC
Start: 2016-11-14 — End: ?

## 2016-11-14 MED ORDER — AMOXICILLIN 500 MG PO CAPS
500 MG | ORAL_CAPSULE | Freq: Three times a day (TID) | ORAL | 0 refills | Status: AC
Start: 2016-11-14 — End: 2016-11-24

## 2016-11-14 MED FILL — AMPICILLIN SODIUM 2 G IJ SOLR: 2 g | INTRAMUSCULAR | Qty: 2

## 2016-11-14 MED FILL — MELATONIN 1 MG PO TABS: 1 MG | ORAL | Qty: 1

## 2016-11-14 MED FILL — METFORMIN HCL 500 MG PO TABS: 500 MG | ORAL | Qty: 1

## 2016-11-14 MED FILL — LANTUS 100 UNIT/ML SC SOLN: 100 UNIT/ML | SUBCUTANEOUS | Qty: 20

## 2016-11-14 MED FILL — SODIUM CHLORIDE 0.9 % IV SOLN: 0.9 % | INTRAVENOUS | Qty: 1000

## 2016-11-14 MED FILL — METFORMIN HCL 500 MG PO TABS: 500 MG | ORAL | Qty: 2

## 2016-11-14 MED FILL — IBUPROFEN 400 MG PO TABS: 400 MG | ORAL | Qty: 1

## 2016-11-14 NOTE — Progress Notes (Signed)
Department of Internal Medicine  Progress Note      SUBJECTIVE: Voices improvement in left leg pain. Afebrile and HDS.  No acute events overnight.       ROS:  All 12 systems reviewed and negative except mentioned in HPI and Assessment and plan    MEDICATIONS:  Current Facility-Administered Medications   Medication Dose Route Frequency Provider Last Rate Last Dose   . metFORMIN (GLUCOPHAGE) tablet 1,000 mg  1,000 mg Oral BID WC Durwood Dittus, MD       . ibuprofen (ADVIL;MOTRIN) tablet 400 mg  400 mg Oral Q6H PRN Daivd Fredericksen, MD   400 mg at 11/14/16 0817   . ampicillin 2 g ivpb mini bag  2 g Intravenous 4 times per day Lollie Sails, MD   Stopped at 11/14/16 1148   . melatonin tablet 10 mg  10 mg Oral Nightly PRN Florence Canner, MD   10 mg at 11/13/16 2311   . 0.9 % sodium chloride bolus  1,000 mL Intravenous Once Wallace Cullens, APRN - CNP       . sodium chloride flush 0.9 % injection 10 mL  10 mL Intravenous 2 times per day Clayborne Artist Dobritch, PA       . sodium chloride flush 0.9 % injection 10 mL  10 mL Intravenous PRN Clayborne Artist Dobritch, PA       . magnesium hydroxide (MILK OF MAGNESIA) 400 MG/5ML suspension 30 mL  30 mL Oral Daily PRN Clayborne Artist Dobritch, PA       . ondansetron [facility]) injection 4 mg  4 mg Intravenous Q6H PRN Clayborne Artist Dobritch, PA       . insulin lispro (HUMALOG) injection vial 0-12 Units  0-12 Units Subcutaneous TID [facility] Clayborne Artist Dobritch, PA   6 Units at 11/14/16 1203   . insulin lispro (HUMALOG) injection vial 0-6 Units  0-6 Units Subcutaneous Nightly Clayborne Artist [place], [place]   3 Units at 11/13/16 2219   . glucose (GLUTOSE) 40 % oral gel 15 g  15 g Oral PRN Florence Canner, MD       . dextrose 50 % solution 12.5 g  12.5 g Intravenous PRN Florence Canner, MD       . glucagon (rDNA) injection 1 mg  1 mg Intramuscular PRN Florence Canner, MD       . dextrose 5 % solution  100 mL/hr Intravenous PRN Florence Canner, MD       . 0.9 % sodium chloride infusion   Intravenous Continuous Florence Canner, MD 100 mL/hr at 11/13/16 2157           OBJECTIVE:  Vital Signs:  Vitals:    11/14/16 0752   BP: 138/71   Pulse: 62   Resp:    Temp: 98.8 F (37.1 C)   SpO2: 96%       Focal exam:      General Exam (except as mentioned above):  CONSTITUTIONAL: Awake, alert, no apparent distress  EYES:  PERRL, conjunctiva normal  ENT:  Normocephalic, atraumatic  NECK:  Supple  BACK:  Symmetric  LUNGS:  CTAB except bilateral basilar crackles.  CARDIOVASCULAR:  S1S2 present  ABDOMEN:  soft, non-distended, non-tender  MUSCULOSKELETAL:  There is no redness, warmth, or swelling of the joints.  NEUROLOGIC:  Alert, awake, oriented x 3. No FND  EXTREMITIES: Warm and well perfused.     LABS  Recent Labs      11/12/16   1600  11/12/16  1932  11/13/16   0647  11/14/16   0648   WBC  11.6*   --   7.7  6.7   RBC  5.22   --   4.48*  4.34*   HGB  15.2  15.8  13.1*  12.7*   HCT  43.9   --   38.1*  37.1*   MCV  84.2   --   85.1  85.6   MCH  29.1   --   29.2  29.2   MCHC  34.6   --   34.3  34.1   RDW  13.8   --   13.9  13.6   PLT  146   --   122*  116*       Recent Labs      11/12/16   1600  11/12/16   1932  11/12/16   2123  11/13/16   0647  11/14/16   0647   NA  130*   --    --   134  137   K  4.3   --    --   4.1  4.2   CL  94*   --    --   99  102   CO2  21*   --    --   24  23   BUN  19   --    --   14  13   CREATININE  1.16  1.4*   --   1.02  1.01   GLUCOSE  486*   --   307  270*  230*   CALCIUM  9.1   --    --   7.9*  7.9*       No results for input(s): MG in the last 72 hours.        ASSESSMENT AND PLAN    Active Hospital Problems    Diagnosis Date Noted   . Hyperglycemia [R73.9] 11/12/2016     - Lef leg cellulitis: On Unasyn. Leukocytosis resolved. 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 States, he was started on metformin 1 month ago. Does not follow carb controlled diet. On SSI . Restarted metformin and increased dose to 1000 mg BID. He has endocrinologist in [place], [place] and at [facility] with whom he will follow  after discharge    Bary Castilla, MD  Pager : [phone]

## 2016-11-14 NOTE — Discharge Instructions (Signed)
Please follow up with primary care doctor and endocrinologist within a week of discharge

## 2016-11-14 NOTE — Progress Notes (Signed)
Cellulitis left leg      No fevers chills sweats no nausea vomiting diarrhea chest pain heaviness    Swelling is much better he's able to walk without pain      BP 138/71   Pulse 62   Temp 98.8 F (37.1 C)   Resp 16   Ht 6\' 1"  (1.854 m)   Wt 270 lb (122.5 kg)   SpO2 96%   BMI 35.62 kg/m      Cardiovascular: Normal rate, normal heart sounds and intact distal pulses.  Exam reveals no gallop.    No murmur heard.  Pulmonary/Chest: No respiratory distress. He has no wheezes. He has no rales. He exhibits no tenderness.   Abdominal: Bowel sounds are normal. He exhibits no distension and no mass. There is no hepatosplenomegaly, splenomegaly or hepatomegaly. There is no tenderness. There is no rebound, no guarding and no CVA tenderness.   Musculoskeletal: He exhibits edema and tenderness.     Skin: Skin is warm. Rash noted. There is erythema. No pallor.                  Patient Active Problem List   Diagnosis   . Umbilical hernia   . External hemorrhoid, thrombosed   . Hyperglycemia         PLAN:    Cellulitis of left leg  Improving okay to discharge and oral amoxicillin

## 2016-11-14 NOTE — Progress Notes (Signed)
Patient vital signs stable.  Got melatonin ordered for patient who has a hard time sleeping with the "loud cpap".  Patient wanted ambien, but Dr. Joette CatchingBaghdy did not want a sedative so went with melatonin.  Patient currently sleeping.  Will continue to monitor.

## 2016-11-14 NOTE — Plan of Care (Signed)
Problem: Discharge Planning:  Goal: Discharged to appropriate level of care  Discharged to appropriate level of care   Outcome: Ongoing      Problem: Body Temperature - Imbalanced:  Goal: Ability to maintain a body temperature in the normal range will improve  Ability to maintain a body temperature in the normal range will improve   Outcome: Ongoing      Problem: Mobility - Impaired:  Goal: Mobility will improve to maximum level  Mobility will improve to maximum level   Outcome: Ongoing      Problem: Pain:  Goal: Pain level will decrease  Pain level will decrease   Outcome: Ongoing    Goal: Control of acute pain  Control of acute pain   Outcome: Ongoing    Goal: Control of chronic pain  Control of chronic pain   Outcome: Ongoing      Problem: Skin Integrity - Impaired:  Goal: Will show no infection signs and symptoms  Will show no infection signs and symptoms   Outcome: Ongoing    Goal: Absence of new skin breakdown  Absence of new skin breakdown   Outcome: Ongoing

## 2016-11-14 NOTE — Progress Notes (Signed)
Diabetic teaching done with pt with the pen, this is what the pt will be going home on. He already knows how to check his sugar. Electronically signed by Excell SeltzerMichelle  Hennes, RN on 11/14/2016 at 4:13 PM

## 2016-11-14 NOTE — Consults (Signed)
Endocrinology Consult      Joseph Richardson  [birth year]  [id number]    Date of Admission:  11/12/2016  3:43 PM  Date of Consultation:  11/14/2016    Consultant: Chelsea Primus PA-C  Supervising Physician: Kris Hartmann MD  PCP:  No primary care provider on file.       Reason for Consultation: Hyperglycemia    C/C:   Chief Complaint   Patient presents with   . Leg Pain     Left lower leg pain and redness and hot to touch since Thurs. Just drove from [place] on Wed        HPI  History of Present Illness: Patient is a 61 year old type 2 insulin-dependent male admitted for left lower leg cellulitis.  The edema and erythema have improved on IV antibiotics.  He was noted to be significantly hyperglycemic.  He was diagnosed with diabetes 2 years ago, was prescribed metformin 4-5 months ago.  He states that he has gained significant weight over the last 2 years and has not been compliant with his dietary regiment.  That has changed in the last 3 months.  He states he is going to start on a significant diabetic diet.  I encouraged the patient to remain inpatient overnight so we could do diabetic education tomorrow morning.  He states that if infectious diseases clears him he is going to leave, will allow the RN on the floor to teach him how to take his insulin injections and will look on YouTube to learn how to utilize the insulin pens.     Allergies: No Known Allergies    PMH: Patient has a past medical history of Diabetes mellitus (HCC); Sleep apnea; and Tinea.    PSH: Patient has a past surgical history that includes other surgical history; hernia repair (01/29/11); Hemorrhoid surgery (01/29/11); and Inguinal hernia repair (Bilateral).    Social History: Patient reports that he quit smoking about 30 years ago. He has never used smokeless tobacco. He reports that he does not drink alcohol or use drugs.    Family History: Patientsfamily history is not on file.    ROS:  Review of Systems   Constitutional: Negative for chills,  fever and weight loss.   HENT: Negative for congestion and sore throat.    Eyes: Negative for blurred vision and double vision.   Respiratory: Negative for cough and shortness of breath.    Cardiovascular: Negative for chest pain and palpitations.   Gastrointestinal: Negative for abdominal pain, diarrhea, nausea and vomiting.   Genitourinary: Negative for dysuria, flank pain, frequency, hematuria and urgency.   Musculoskeletal: Negative for back pain, myalgias and neck pain.        Left lower extremity erythema and edema and pain   Skin: Negative for rash.   Neurological: Negative for dizziness and headaches.   Endo/Heme/Allergies: Negative for environmental allergies and polydipsia. Does not bruise/bleed easily.   Psychiatric/Behavioral: Negative for depression.       Current Meds:   metFORMIN (GLUCOPHAGE) tablet 1,000 mg BID WC   ibuprofen (ADVIL;MOTRIN) tablet 400 mg Q6H PRN   ampicillin 2 g ivpb mini bag 4 times per day   melatonin tablet 10 mg Nightly PRN   0.9 % sodium chloride bolus Once   sodium chloride flush 0.9 % injection 10 mL 2 times per day   sodium chloride flush 0.9 % injection 10 mL PRN   magnesium hydroxide (MILK OF MAGNESIA) 400 MG/5ML suspension 30 mL Daily PRN   ondansetron (  ZOFRAN) injection 4 mg Q6H PRN   insulin lispro (HUMALOG) injection vial 0-12 Units TID WC   insulin lispro (HUMALOG) injection vial 0-6 Units Nightly   glucose (GLUTOSE) 40 % oral gel 15 g PRN   dextrose 50 % solution 12.5 g PRN   glucagon (rDNA) injection 1 mg PRN   dextrose 5 % solution PRN   0.9 % sodium chloride infusion Continuous         Vitals:  BP 138/71   Pulse 62   Temp 98.8 F (37.1 C)   Resp 16   Ht  (1.854 m)   Wt 270 lb (122.5 kg)   SpO2 96%   BMI 35.62 kg/m   I/O (24Hr):    Intake/Output Summary (Last 24 hours) at 11/14/16 1534  Last data filed at 11/14/16 1157   Gross per 24 hour   Intake             4064 ml   Output             2600 ml   Net             1464 ml             Physical Exam    Constitutional: He is oriented to person, place, and time. He appears well-developed and well-nourished.   HENT:   Head: Normocephalic and atraumatic.   Eyes: Conjunctivae and EOM are normal.   Neck: Normal range of motion. Neck supple.   Cardiovascular: Normal rate, regular rhythm and normal heart sounds.    Pulmonary/Chest: Effort normal and breath sounds normal.   Abdominal: Soft. Bowel sounds are normal.   Musculoskeletal: Normal range of motion. He exhibits edema and tenderness.   Left lower extremity erythema, induration, and edema   Neurological: He is alert and oriented to person, place, and time.   Skin: Skin is warm and dry.   Psychiatric: He has a normal mood and affect.       Labs:  Recent Labs      11/13/16   0647   LABA1C  12.0*     Recent Labs      11/13/16   1126  11/13/16   1648  11/13/16   2216  11/14/16   0751  11/14/16   1124   POCGLU  281*  280*  285*  235*  272*     Hematology:  Recent Labs      11/12/16   1639   11/14/16   0648   WBC   --    < >  6.7   HGB   --    < >  12.7*   HCT   --    < >  37.1*   PLT   --    < >  116*   PROTIME  10.1   --    --    INR  1.0   --    --    APTT  27.3   --    --     < > = values in this interval not displayed.     Chemistry:  Recent Labs      11/12/16   1932   11/14/16   0647   NA   --    < >  137   K   --    < >  4.2   CL   --    < >  102   CO2   --    < >  23   GLUCOSE   --    < >  230*   BUN   --    < >  13   CREATININE  1.4*   < >  1.01   ANIONGAP   --    < >  12   LABGLOM  51*   < >  >60.0   GFRAA  >60   < >  >60.0   CALCIUM   --    < >  7.9*   CAION  1.08*   --    --     < > = values in this interval not displayed.       Urinalysis: Recent Labs      11/12/16   1800   COLORU  Yellow   PHUR  5.0   CLARITYU  Clear   SPECGRAV  1.030   LEUKOCYTESUR  Negative   UROBILINOGEN  0.2   BILIRUBINUR  Negative   BLOODU  Negative        Urine Culture No results found for: LABURIN        Assessment:   1. Type 2 diabetes, poorly controlled  2. New to insulin  3. Left  lower leg cellulitis  4. Dietary and medication noncompliance      Plan:  1. Start Lantus 20 units at bedtime, Humalog 10 units 3 times a day with meals  2. Medium dose sliding scale coverage  3. Increase metformin 1000 mg by mouth twice a day  4. Patient may be discharged home from endocrinology standpoint, insulin pens and needles sent to [facility] in [facility]  5. Patient to follow-up with his established endocrinologist at his home in [place]        The History and Physical exam, radiologic and laboratory findings have all been discussed with Dr Kris Hartmann, who agrees with the assessment and plan as described above.     Electronically signed by Lenis Noon, PA on 11/14/2016 at 3:34 PM

## 2016-11-14 NOTE — Progress Notes (Signed)
Pt given d/c instructions. Diabetic education, and iv hep lock removed. Electronically signed by Excell Seltzer, RN on 11/14/2016 at 6:49 PM

## 2016-11-15 MED ORDER — PEN NEEDLES 32G X 5 MM MISC
Freq: Four times a day (QID) | 3 refills | Status: AC
Start: 2016-11-15 — End: ?

## 2016-11-17 LAB — CULTURE, BLOOD 2: Culture, Blood 2: NO GROWTH

## 2016-11-17 LAB — CULTURE BLOOD #1: Blood Culture, Routine: NO GROWTH

## 2017-03-15 ENCOUNTER — Ambulatory Visit (INDEPENDENT_AMBULATORY_CARE_PROVIDER_SITE_OTHER): Payer: 59 | Admitting: Orthopaedic Surgery

## 2017-03-15 ENCOUNTER — Encounter (INDEPENDENT_AMBULATORY_CARE_PROVIDER_SITE_OTHER): Payer: Self-pay | Admitting: Orthopaedic Surgery

## 2017-03-15 DIAGNOSIS — M7711 Lateral epicondylitis, right elbow: Secondary | ICD-10-CM | POA: Diagnosis not present

## 2017-03-15 MED ORDER — METHYLPREDNISOLONE ACETATE 40 MG/ML IJ SUSP
40.0000 mg | INTRAMUSCULAR | Status: AC | PRN
  Administered 2017-03-15: 40 mg

## 2017-03-15 MED ORDER — LIDOCAINE HCL 1 % IJ SOLN
1.0000 mL | INTRAMUSCULAR | Status: AC | PRN
  Administered 2017-03-15: 1 mL

## 2017-03-15 NOTE — Progress Notes (Signed)
   Office Visit Note   Patient: Noah Lane           Date of Birth: 03-18-1955           MRN: [id number] Visit Date: 03/15/2017              Requested by: No referring provider defined for this encounter. PCP: System, Pcp Not In   Assessment & Plan: Visit Diagnoses:  1. Lateral epicondylitis, right elbow     Plan: His clinical signs and symptoms are consistent with lateral epicondylitis of the right elbow.  We talked about steroid injection she he has done well with these in the past.  We prepped the area and then provide a steroid injection without difficulty.  He will watch his blood glucose closely.  I showed him stretching exercises to try as well.  He also does have Voltaren gel which she will continue.  All questions concerns were answered and addressed.  Follow-up as needed.  Follow-Up Instructions: Return if symptoms worsen or fail to improve.   Orders:  No orders of the defined types were placed in this encounter.  No orders of the defined types were placed in this encounter.     Procedures: Hand/UE Inj: R elbow for lateral epicondylitis on 03/15/2017 9:10 AM Medications: 1 mL lidocaine 1 %; 40 mg methylPREDNISolone acetate 40 MG/ML      Clinical Data: No additional findings.   Subjective: Chief Complaint  Patient presents with  . Right Elbow - Pain  The patient is a very pleasant 62 year old right-hand-dominant male who comes in with a history of right elbow pain over the lateral epicondyle area.  He said years ago he was diagnosed with lateral epicondylitis or tennis elbow and had an injection that lasted for the last 4-5 years.  He like to consider injection again today.  He denies any injury to that elbow.  He is a diabetic but under good control with hemoglobin A1c under 7.  He denies any repetitive activities and said this is something that flares up from time to time.  He denies any numbness and tingling in his right upper extremity he points to the  lateral epicondyle area of his elbow as a source of his pain. HPI  Review of Systems He currently denies any headache, chest pain, shortness of breath, fever, chills, nausea, vomiting.  Objective: Vital Signs: There were no vitals taken for this visit.  Physical Exam He is alert and oriented x3 and in no acute distress Ortho Exam Examination of his right elbow shows pain over the lateral epicondyle area only with otherwise full range of motion no effusion no ligamentous instability. Specialty Comments:  No specialty comments available.  Imaging: No results found.   PMFS History: Patient Active Problem List   Diagnosis Date Noted  . Lateral epicondylitis, right elbow 03/15/2017   History reviewed. No pertinent past medical history.  History reviewed. No pertinent family history.  History reviewed. No pertinent surgical history. Social History   Occupational History  . Not on file  Tobacco Use  . Smoking status: Not on file  Substance and Sexual Activity  . Alcohol use: Not on file  . Drug use: Not on file  . Sexual activity: Not on file

## 2019-03-12 ENCOUNTER — Encounter: Payer: Self-pay | Admitting: Family Medicine

## 2019-03-12 ENCOUNTER — Other Ambulatory Visit: Payer: Self-pay

## 2019-03-12 ENCOUNTER — Ambulatory Visit (INDEPENDENT_AMBULATORY_CARE_PROVIDER_SITE_OTHER): Payer: Medicare Other | Admitting: Family Medicine

## 2019-03-12 ENCOUNTER — Ambulatory Visit: Payer: Self-pay

## 2019-03-12 VITALS — BP 122/70 | Ht 73.0 in | Wt 215.0 lb

## 2019-03-12 DIAGNOSIS — M25562 Pain in left knee: Secondary | ICD-10-CM | POA: Diagnosis present

## 2019-03-12 DIAGNOSIS — M25462 Effusion, left knee: Secondary | ICD-10-CM | POA: Insufficient documentation

## 2019-03-12 MED ORDER — CEPHALEXIN 500 MG PO CAPS: 500.0000 mg | ORAL_CAPSULE | Freq: Four times a day (QID) | ORAL | 0 refills | Status: DC

## 2019-03-12 NOTE — Patient Instructions (Addendum)
You have cellulitis. Take keflex 4 times a day x 10 days. Ok to ice the area as needed for 15 minutes at a time. Take aleve 2 tabs twice a day with food for the inflammation for 7 days then as needed. Try not to scratch the area. Follow up with me on Friday morning if possible for reevaluation and to make sure this is improving as I expect to be. Call me though if you get fevers, chills, the redness spreads despite treatment.

## 2019-03-12 NOTE — Progress Notes (Signed)
HPI  CC: Left knee pain  Traumatic: Had fall on left knee 6 weeks ago, but only experienced 3 days of pain.  Now experiencing pain and swelling in that knee for 1 week.   Location: Left knee below the patella anteriorly Quality: Soreness, aching  Duration: 1 week Timing: Does not occur with walking or use.  Only hurts with rest.   Improving/Worsening: Has been taking tramadol and naproxen for approximately 1 week.  Also icing it. Makes better: Tramadol, naproxen Makes worse: Sitting, laying down Associated symptoms: Patient had a episode of dyshidrotic eczema 1 week prior to this episode of swelling.  Patient is very active, he walks 20,000 steps a day.  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  He states he went to the urgent care approximately 1 week ago where he got the tramadol, naproxen and they did an x-ray that showed no fracture.   ROS: Per HPI; in addition no fever, no rash, no additional weakness, no additional numbness, no additional paresthesias, and no additional falls/injury. No chills or sweats  Objective: BP 122/70   Ht 6\' 1"  (1.854 m)   Wt 215 lb (97.5 kg)   BMI 28.37 kg/m  Gen: NAD, well groomed, a/o x3, normal affect.  Very talkative and energetic CV: Well-perfused. Warm.  Resp: Non-labored.  Neuro: Sensation intact throughout lower extremities.   MSK: Normal range of motion of the hip knee and ankle.  Mild tenderness to palpation on the left leg in the area of the tibial tubercle at the site of swelling and redness.  No joint line, patellar facet tenderness.  Negative patellar grind test.  Nonballotable knee.  5/5 strength bilaterally in the lower extremities.  Negative ant drawer, post drawer, valgus and varus stress.  Negative apleys, mcmurrays.  Skin: Mild to moderate swelling on the anterior leg midline approximately 2 inches below the patella.  The swelling and surrounding area are erythematous and warm to touch.  There  is some peeling of the skin in that area.    Ultrasound left knee: Ultrasound of the left knee showed some subcutaneous soft tissue swelling/fluid superficial to the tibial tubercle.  Minimal infrapatellar bursitis with anechoic fluid here.  Assessment and Plan:  Pain and swelling of left knee Visible erythema, swelling, tenderness to palpation, warm to touch below the left patella near the area of the tibial tubercle.  Ultrasound shows that the fluid is superficial to the patella tendon with some minor fluid deep to the tendon.  Patient symptoms and physical exam are most consistent with mild cellulitis.  It is possible introduced an infection while he was experiencing a flare of dyshidrotic eczema a few weeks ago. -Keflex 4 times a day for 10 days -Naproxen twice daily -Ice the area to reduce swelling -Follow-up Friday of this week -Return precautions for signs of systemic infection   Orders Placed This Encounter  Procedures  . [place] LIMITED JOINT SPACE STRUCTURES LOW LEFT    Standing Status:   Future    Number of Occurrences:   1    Standing Expiration Date:   05/09/2020    Order Specific Question:   Reason for Exam (SYMPTOM  OR DIAGNOSIS REQUIRED)    Answer:   left knee pain    Order Specific Question:   Preferred imaging location?    Answer:   Internal    Meds ordered this encounter  Medications  . cephALEXin (KEFLEX) 500 MG capsule    Sig: Take  1 capsule (500 mg total) by mouth 4 (four) times daily.    Dispense:  40 capsule    Refill:  0     Addison Naegeli, MD, PGY 2 [facility] 03/12/2019 12:01 PM

## 2019-03-12 NOTE — Assessment & Plan Note (Signed)
Visible erythema, swelling, tenderness to palpation, warm to touch below the left patella near the area of the tibial tubercle.  Ultrasound shows that the fluid is superficial to the patella tendon with some minor fluid deep to the tendon.  Patient symptoms and physical exam are most consistent with mild cellulitis.  It is possible introduced an infection while he was experiencing a flare of dyshidrotic eczema a few weeks ago. -Keflex 4 times a day for 10 days -Naproxen twice daily -Ice the area to reduce swelling -Follow-up Friday of this week -Return precautions for signs of systemic infection

## 2019-03-16 ENCOUNTER — Encounter: Payer: Self-pay | Admitting: Family Medicine

## 2019-03-16 ENCOUNTER — Ambulatory Visit (INDEPENDENT_AMBULATORY_CARE_PROVIDER_SITE_OTHER): Payer: Medicare Other | Admitting: Family Medicine

## 2019-03-16 ENCOUNTER — Other Ambulatory Visit: Payer: Self-pay

## 2019-03-16 VITALS — BP 142/74

## 2019-03-16 DIAGNOSIS — L03116 Cellulitis of left lower limb: Secondary | ICD-10-CM | POA: Diagnosis present

## 2019-03-16 MED ORDER — DOXYCYCLINE HYCLATE 100 MG PO TABS: ORAL_TABLET | ORAL | 0 refills | Status: DC

## 2019-03-16 NOTE — Progress Notes (Signed)
PCP: System, Pcp Not In  Subjective:   HPI: Patient is a 64 y.o. male here for follow-up of left leg cellulitis.  Patient had an injury to his left knee causing an abrasion which led to cellulitis.  He was seen on Monday and placed on Keflex.  At that time he also had an ultrasound of his knee which did not show any drainable fluid collection.  Patient notes since Monday he thinks the redness and swelling has improved slightly.  He did have some pain last night.  He denies any fevers or chills.  He denies any reaction to the antibiotics.   Review of Systems: See HPI above.  History reviewed. No pertinent past medical history.  Current Outpatient Medications on File Prior to Visit  Medication Sig Dispense Refill  . atorvastatin (LIPITOR) 10 MG tablet Take 5 mg by mouth daily.    . cephALEXin (KEFLEX) 500 MG capsule Take 1 capsule (500 mg total) by mouth 4 (four) times daily. 40 capsule 0  . naproxen (NAPROSYN) 500 MG tablet Take 500 mg by mouth 2 (two) times daily.    . tadalafil (CIALIS) 20 MG tablet TAKE ONE TABLET BY MOUTH ONE TIME DAILY AS NEEDED FOR UP TO 30 DAYS FOR ERECTILE DYSFUNCTION    . traMADol (ULTRAM) 50 MG tablet Take 50 mg by mouth at bedtime.      No current facility-administered medications on file prior to visit.    History reviewed. No pertinent surgical history.  No Known Allergies  Social History   Socioeconomic History  . Marital status: Single    Spouse name: Not on file  . Number of children: Not on file  . Years of education: Not on file  . Highest education level: Not on file  Occupational History  . Not on file  Tobacco Use  . Smoking status: Not on file  Substance and Sexual Activity  . Alcohol use: Not on file  . Drug use: Not on file  . Sexual activity: Not on file  Other Topics Concern  . Not on file  Social History Narrative  . Not on file   Social Determinants of Health   Financial Resource Strain:   . Difficulty of Paying Living  Expenses: Not on file  Food Insecurity:   . Worried About Charity fundraiser in the Last Year: Not on file  . Ran Out of Food in the Last Year: Not on file  Transportation Needs:   . Lack of Transportation (Medical): Not on file  . Lack of Transportation (Non-Medical): Not on file  Physical Activity:   . Days of Exercise per Week: Not on file  . Minutes of Exercise per Session: Not on file  Stress:   . Feeling of Stress : Not on file  Social Connections:   . Frequency of Communication with Friends and Family: Not on file  . Frequency of Social Gatherings with Friends and Family: Not on file  . Attends Religious Services: Not on file  . Active Member of Clubs or Organizations: Not on file  . Attends Archivist Meetings: Not on file  . Marital Status: Not on file  Intimate Partner Violence:   . Fear of Current or Ex-Partner: Not on file  . Emotionally Abused: Not on file  . Physically Abused: Not on file  . Sexually Abused: Not on file    History reviewed. No pertinent family history.      Objective:  Physical Exam: BP (!) 142/74  Gen: NAD, comfortable in exam room Lungs: Breathing comfortably on room air Skin: Area of redness and swelling inferior to the left patella.  This area is warm to the touch however improved compared to his examination on Monday.  The area of redness does not extend as far inferiorly down the leg as before.  This area is nontender to palpation.  There is no drainage.   Limited diagnostic ultrasound of the left lower extremity Findings: -Swelling of the infrapatellar bursa noted however no significant change since his ultrasound on Monday -Moderate to severe amount of subcutaneous edema noted above the patellar tendon.  However there is no drainable fluid collection.  This is largely unchanged from his ultrasound on Monday Impression: -Ultrasound findings consistent with cellulitis without abscess   Assessment & Plan:  Patient is a 64  y.o. male here for left lower extremity cellulitis  1.  Left lower extremity cellulitis -Patient to continue taking Keflex until it is finished -Prescription sent for doxycycline 100 mg twice daily x10 days. -Patient was advised to call if he develops any systemic symptoms such as fevers or chills  Patient follow-up in 1 week

## 2019-03-16 NOTE — Patient Instructions (Signed)
Your cellulitis has improved since her last visit however it is not as improved as we would like to see it. -Continue taking the Keflex prescribed at your last visit -I have sent in a prescription for doxycycline.  You should take this twice a day for 10 days in addition to the Keflex which you are already taking -Doxycycline can make your skin sensitive to the sun.  So make sure you are covered up when going outside -We will see you back in 1 week  Please call if you have any questions or concerns.  If you develop fevers or chills please let our office know

## 2019-03-22 ENCOUNTER — Ambulatory Visit: Payer: Medicare Other | Attending: Internal Medicine

## 2019-03-22 DIAGNOSIS — Z23 Encounter for immunization: Secondary | ICD-10-CM | POA: Insufficient documentation

## 2019-03-22 NOTE — Progress Notes (Signed)
   Covid-19 Vaccination Clinic  Name:  Noah Lane    MRN: [id number] DOB: 1955-12-18  03/22/2019  Mr. Noah Lane was observed post Covid-19 immunization for 15 minutes without incidence. He was provided with Vaccine Information Sheet and instruction to access the V-Safe system.   Mr. Noah Lane was instructed to call 911 with any severe reactions post vaccine: Marland Kitchen Difficulty breathing  . Swelling of your face and throat  . A fast heartbeat  . A bad rash all over your body  . Dizziness and weakness    Immunizations Administered    Name Date Dose VIS Date Route   Pfizer COVID-19 Vaccine 03/22/2019  6:12 PM 0.3 mL 02/09/2019 Intramuscular   Manufacturer: [place]   Lot: [id number]   [place]: [id number]

## 2019-03-23 ENCOUNTER — Ambulatory Visit (INDEPENDENT_AMBULATORY_CARE_PROVIDER_SITE_OTHER): Payer: Medicare Other | Admitting: Family Medicine

## 2019-03-23 ENCOUNTER — Encounter: Payer: Self-pay | Admitting: Family Medicine

## 2019-03-23 ENCOUNTER — Other Ambulatory Visit: Payer: Self-pay

## 2019-03-23 VITALS — BP 122/80 | Ht 73.0 in | Wt 215.0 lb

## 2019-03-23 DIAGNOSIS — L03116 Cellulitis of left lower limb: Secondary | ICD-10-CM

## 2019-03-23 NOTE — Patient Instructions (Signed)
This continues to slowly improve. Finish all the antibiotics as prescribed. Icing 15 minutes at a time 3-4 times a day. Compression if you can to help with the swelling locally as well. Follow up with me in 1 week.  The primary care provider groups to look into:  [place] [facility] and at [place]

## 2019-03-23 NOTE — Progress Notes (Signed)
PCP: System, Pcp Not In  Subjective:   HPI: 1/15: Patient is a 64 y.o. male here for follow-up of left leg cellulitis.  Patient had an injury to his left knee causing an abrasion which led to cellulitis.  He was seen on Monday and placed on Keflex.  At that time he also had an ultrasound of his knee which did not show any drainable fluid collection.  Patient notes since Monday he thinks the redness and swelling has improved slightly.  He did have some pain last night.  He denies any fevers or chills.  He denies any reaction to the antibiotics.  1/22: Patient continues to slowly improve. He's finished keflex but thinks he may have a couple pills of this left. Taking doxycycline with about 2 days left of this. No fevers. No pain with ambulation. No tenderness and able to sleep. Still with some redness in the area.  History reviewed. No pertinent past medical history.  Current Outpatient Medications on File Prior to Visit  Medication Sig Dispense Refill  . atorvastatin (LIPITOR) 10 MG tablet Take 5 mg by mouth daily.    . cephALEXin (KEFLEX) 500 MG capsule Take 1 capsule (500 mg total) by mouth 4 (four) times daily. 40 capsule 0  . doxycycline (VIBRA-TABS) 100 MG tablet Take 1 tab twice daily for 10 days. 20 tablet 0  . naproxen (NAPROSYN) 500 MG tablet Take 500 mg by mouth 2 (two) times daily.    . tadalafil (CIALIS) 20 MG tablet TAKE ONE TABLET BY MOUTH ONE TIME DAILY AS NEEDED FOR UP TO 30 DAYS FOR ERECTILE DYSFUNCTION    . traMADol (ULTRAM) 50 MG tablet Take 50 mg by mouth at bedtime.      No current facility-administered medications on file prior to visit.    History reviewed. No pertinent surgical history.  No Known Allergies  Social History   Socioeconomic History  . Marital status: Single    Spouse name: Not on file  . Number of children: Not on file  . Years of education: Not on file  . Highest education level: Not on file  Occupational History  . Not on file   Tobacco Use  . Smoking status: Not on file  Substance and Sexual Activity  . Alcohol use: Not on file  . Drug use: Not on file  . Sexual activity: Not on file  Other Topics Concern  . Not on file  Social History Narrative  . Not on file   Social Determinants of Health   Financial Resource Strain:   . Difficulty of Paying Living Expenses: Not on file  Food Insecurity:   . Worried About Charity fundraiser in the Last Year: Not on file  . Ran Out of Food in the Last Year: Not on file  Transportation Needs:   . Lack of Transportation (Medical): Not on file  . Lack of Transportation (Non-Medical): Not on file  Physical Activity:   . Days of Exercise per Week: Not on file  . Minutes of Exercise per Session: Not on file  Stress:   . Feeling of Stress : Not on file  Social Connections:   . Frequency of Communication with Friends and Family: Not on file  . Frequency of Social Gatherings with Friends and Family: Not on file  . Attends Religious Services: Not on file  . Active Member of Clubs or Organizations: Not on file  . Attends Archivist Meetings: Not on file  . Marital Status:  Not on file  Intimate Partner Violence:   . Fear of Current or Ex-Partner: Not on file  . Emotionally Abused: Not on file  . Physically Abused: Not on file  . Sexually Abused: Not on file    History reviewed. No pertinent family history.      Objective:  Physical Exam: BP 122/80   Ht 6\' 1"  (1.854 m)   Wt 215 lb (97.5 kg)   BMI 28.37 kg/m   Gen: NAD, comfortable in exam room  Left knee: Area of redness, mild swelling and minimal warmth over tibial tubercle of knee.  No effusion. No tenderness to palpation. FROM knee without pain, normal strength noted. Ambulating without a limp or discomfort.  Limited MSK u/s left knee: Minimal swelling of deep infrapatellar bursa but anechoic.  No effusion.  Soft tissue swelling with neovascularity of superficial tissues consistent with  cellulitis.  No drainable fluid collection and no abscess.   Assessment & Plan:  Patient is a 64 y.o. male here for left lower extremity cellulitis  1. Left lower extremity cellulitis - complete keflex and doxycycline.  Clinically improving.  Icing, compression.  Follow up in 1 week for reevaluation.  Call with fever, chills, expanding redness, increased pain, other concerns prior to that visit.

## 2019-03-30 ENCOUNTER — Encounter: Payer: Self-pay | Admitting: Family Medicine

## 2019-03-30 ENCOUNTER — Other Ambulatory Visit: Payer: Self-pay

## 2019-03-30 ENCOUNTER — Ambulatory Visit (INDEPENDENT_AMBULATORY_CARE_PROVIDER_SITE_OTHER): Payer: Medicare Other | Admitting: Family Medicine

## 2019-03-30 VITALS — BP 132/64 | Ht 73.0 in | Wt 210.0 lb

## 2019-03-30 DIAGNOSIS — L03116 Cellulitis of left lower limb: Secondary | ICD-10-CM | POA: Diagnosis present

## 2019-03-30 NOTE — Progress Notes (Signed)
PCP: Salvatore Marvel, PA-C  Subjective:   HPI: 1/15: Patient is a 64 y.o. male here for follow-up of left leg cellulitis.  Patient had an injury to his left knee causing an abrasion which led to cellulitis.  He was seen on Monday and placed on Keflex.  At that time he also had an ultrasound of his knee which did not show any drainable fluid collection.  Patient notes since Monday he thinks the redness and swelling has improved slightly.  He did have some pain last night.  He denies any fevers or chills.  He denies any reaction to the antibiotics.  1/22: Patient continues to slowly improve. He's finished keflex but thinks he may have a couple pills of this left. Taking doxycycline with about 2 days left of this. No fevers. No pain with ambulation. No tenderness and able to sleep. Still with some redness in the area.  1/29: Patient continues to improved. Redness and warmth has resolved. Has completed antibiotics. Swelling has gone down. Doing compression sleeve which has helped too. No issues with ambulation.  No past medical history on file.  Current Outpatient Medications on File Prior to Visit  Medication Sig Dispense Refill  . atorvastatin (LIPITOR) 10 MG tablet Take 5 mg by mouth daily.    . cephALEXin (KEFLEX) 500 MG capsule Take 1 capsule (500 mg total) by mouth 4 (four) times daily. 40 capsule 0  . doxycycline (VIBRA-TABS) 100 MG tablet Take 1 tab twice daily for 10 days. 20 tablet 0  . naproxen (NAPROSYN) 500 MG tablet Take 500 mg by mouth 2 (two) times daily.    . tadalafil (CIALIS) 20 MG tablet TAKE ONE TABLET BY MOUTH ONE TIME DAILY AS NEEDED FOR UP TO 30 DAYS FOR ERECTILE DYSFUNCTION    . traMADol (ULTRAM) 50 MG tablet Take 50 mg by mouth at bedtime.      No current facility-administered medications on file prior to visit.    No past surgical history on file.  No Known Allergies  Social History   Socioeconomic History  . Marital status: Single    Spouse name:  Not on file  . Number of children: Not on file  . Years of education: Not on file  . Highest education level: Not on file  Occupational History  . Not on file  Tobacco Use  . Smoking status: Not on file  Substance and Sexual Activity  . Alcohol use: Not on file  . Drug use: Not on file  . Sexual activity: Not on file  Other Topics Concern  . Not on file  Social History Narrative  . Not on file   Social Determinants of Health   Financial Resource Strain:   . Difficulty of Paying Living Expenses: Not on file  Food Insecurity:   . Worried About Charity fundraiser in the Last Year: Not on file  . Ran Out of Food in the Last Year: Not on file  Transportation Needs:   . Lack of Transportation (Medical): Not on file  . Lack of Transportation (Non-Medical): Not on file  Physical Activity:   . Days of Exercise per Week: Not on file  . Minutes of Exercise per Session: Not on file  Stress:   . Feeling of Stress : Not on file  Social Connections:   . Frequency of Communication with Friends and Family: Not on file  . Frequency of Social Gatherings with Friends and Family: Not on file  . Attends Religious Services:  Not on file  . Active Member of Clubs or Organizations: Not on file  . Attends Archivist Meetings: Not on file  . Marital Status: Not on file  Intimate Partner Violence:   . Fear of Current or Ex-Partner: Not on file  . Emotionally Abused: Not on file  . Physically Abused: Not on file  . Sexually Abused: Not on file    No family history on file.      Objective:  Physical Exam: BP 132/64   Ht 6\' 1"  (1.854 m)   Wt 210 lb (95.3 kg)   BMI 27.71 kg/m   Gen: NAD, comfortable in exam room  Left knee: No redness or warmth.  Still with mild swelling over tibial tubercle.  No effusion. No tenderness to palpation. FROM without pain or limp.  Limited MSK u/s left knee: Minimal neovascularity over tibial tubercle now.  Left knee: Area of redness, mild  swelling and minimal warmth over tibial tubercle of knee.  No effusion. No tenderness to palpation. FROM knee without pain, normal strength noted. Ambulating without a limp or discomfort.  Limited MSK u/s left knee: Minimal swelling deep infrapatellar bursa, anechoic.  Soft tissue swelling over tibial tubercle but minimal neovascularity, much improved.  No drainable fluid collection or abscess.   Assessment & Plan:  Patient is a 64 y.o. male here for left lower extremity cellulitis  1. Left lower extremity cellulitis - much improved with keflex and doxycycline.  Icing, compression - expect swelling to take several weeks to completely resolve.  F/u prn.

## 2019-03-30 NOTE — Patient Instructions (Signed)
Continue with compression, icing of this area. The cellulitis is resolved but you still have some of the soft tissue swelling that will take several weeks to resolve. I would recommend establishing at [facility] - Dr. Nani Ravens, Mackie Pai, though all of them are great there. Call me if you have any problems otherwise follow up as needed.

## 2019-04-12 ENCOUNTER — Ambulatory Visit: Payer: Medicare Other | Attending: Internal Medicine

## 2019-04-12 DIAGNOSIS — Z23 Encounter for immunization: Secondary | ICD-10-CM

## 2019-04-12 NOTE — Progress Notes (Signed)
   Covid-19 Vaccination Clinic  Name:  Antwand Arostegui    MRN: [id number] DOB: 1955-10-07  04/12/2019  Mr. Gallia was observed post Covid-19 immunization for 15 minutes without incidence. He was provided with Vaccine Information Sheet and instruction to access the V-Safe system.   Mr. Currin was instructed to call 911 with any severe reactions post vaccine: Marland Kitchen Difficulty breathing  . Swelling of your face and throat  . A fast heartbeat  . A bad rash all over your body  . Dizziness and weakness    Immunizations Administered    Name Date Dose VIS Date Route   Pfizer COVID-19 Vaccine 04/12/2019 11:35 AM 0.3 mL 02/09/2019 Intramuscular   Manufacturer: [place]   Lot: [id number]   [place]: [id number]

## 2019-05-25 ENCOUNTER — Other Ambulatory Visit: Payer: Self-pay

## 2019-05-28 ENCOUNTER — Encounter: Payer: Self-pay | Admitting: Family Medicine

## 2019-05-28 ENCOUNTER — Ambulatory Visit (INDEPENDENT_AMBULATORY_CARE_PROVIDER_SITE_OTHER): Payer: Medicare Other | Admitting: Family Medicine

## 2019-05-28 ENCOUNTER — Other Ambulatory Visit: Payer: Self-pay

## 2019-05-28 VITALS — BP 132/78 | HR 61 | Temp 96.4°F | Ht 73.0 in | Wt 245.4 lb

## 2019-05-28 DIAGNOSIS — M25551 Pain in right hip: Secondary | ICD-10-CM | POA: Diagnosis not present

## 2019-05-28 DIAGNOSIS — E1169 Type 2 diabetes mellitus with other specified complication: Secondary | ICD-10-CM | POA: Diagnosis not present

## 2019-05-28 DIAGNOSIS — Z1211 Encounter for screening for malignant neoplasm of colon: Secondary | ICD-10-CM

## 2019-05-28 DIAGNOSIS — M545 Low back pain: Secondary | ICD-10-CM

## 2019-05-28 DIAGNOSIS — G8929 Other chronic pain: Secondary | ICD-10-CM

## 2019-05-28 DIAGNOSIS — M25552 Pain in left hip: Secondary | ICD-10-CM

## 2019-05-28 DIAGNOSIS — N529 Male erectile dysfunction, unspecified: Secondary | ICD-10-CM | POA: Insufficient documentation

## 2019-05-28 DIAGNOSIS — E669 Obesity, unspecified: Secondary | ICD-10-CM | POA: Insufficient documentation

## 2019-05-28 MED ORDER — TADALAFIL 20 MG PO TABS: ORAL_TABLET | ORAL | 5 refills | Status: DC

## 2019-05-28 NOTE — Progress Notes (Signed)
Chief Complaint  Patient presents with  . New Patient (Initial Visit)  . Back Pain  . Hip Pain       New Patient Visit SUBJECTIVE: HPI: Noah Lane is an 64 y.o.male who is being seen for establishing care.  The patient was previously seen at [facility].  Back and hips have been bothering him chronically. Has been picking up over past mo, no inj or change in activity. Bothersome at night and affecting sleep. No bruising, redness, swelling. Outer hips and lower back bothersome.   Hx of DM Takes Lipitor 5 mg/d. Diet controlled. Walking for exercise. Diet is fair.   ED Pt w hx of ED, takes Cilais 20 m/d prn. Would like a refill. No AE's. Is effective.   [facility] Patient had a colonoscopy around the end of 2015 in [place].  5-year follow-up was recommended.  He needs a GI specialist in the area.  Past Medical History:  Diagnosis Date  . Diabetes mellitus without complication [facility])    Past Surgical History:  Procedure Laterality Date  . HERNIA REPAIR  2012   Family History  Problem Relation Age of Onset  . Heart disease Mother   . Diabetes Father   . Cancer Father        post age of 63   No Known Allergies  Current Outpatient Medications:  .  Alpha-Lipoic Acid 300 MG CAPS, Takes four times daily, Disp: , Rfl:  .  APPLE CIDER VINEGAR PO, , Disp: , Rfl:  .  Garlic 10 MG CAPS, Take four times per day, Disp: , Rfl:  .  Green Tea, Camellia sinensis, (GREEN TEA EXTRACT PO), Take four times per day, Disp: , Rfl:  .  tadalafil (CIALIS) 20 MG tablet, TAKE ONE TABLET BY MOUTH ONE TIME DAILY AS NEEDED FOR ERECTILE DYSFUNCTION, Disp: 30 tablet, Rfl: 5 .  atorvastatin (LIPITOR) 10 MG tablet, Take 5 mg by mouth daily., Disp: , Rfl:  .  naproxen (NAPROSYN) 500 MG tablet, Take 500 mg by mouth 2 (two) times daily., Disp: , Rfl:    OBJECTIVE: BP 132/78 (BP Location: Left Arm, Patient Position: Sitting, Cuff Size: Normal)   Pulse 61   Temp (!) 96.4 F (35.8 C) (Temporal)   Ht 6\' 1"  (1.854 m)    Wt 245 lb 6 oz (111.3 kg)   SpO2 98%   BMI 32.37 kg/m  General:  well developed, well nourished, in no apparent distress Skin:  no significant moles, warts, or growths Nose:  nares patent, septum midline, mucosa normal Throat/Pharynx:  lips and gingiva without lesion; tongue and uvula midline; non-inflamed pharynx; no exudates or postnasal drainage Lungs:  clear to auscultation, breath sounds equal bilaterally, no respiratory distress Cardio:  regular rate and rhythm, no LE edema or bruits Musculoskeletal:  symmetrical muscle groups noted without atrophy or deformity Neuro:  gait normal Psych: well oriented with normal range of affect and appropriate judgment/insight  ASSESSMENT/PLAN: Hip pain, bilateral - Plan: Ambulatory referral to Physical Therapy  Chronic bilateral low back pain without sciatica - Plan: Ambulatory referral to Physical Therapy  Diabetes mellitus type 2 in obese ([place]) - Plan: Hemoglobin A1c, Lipid panel, Microalbumin / creatinine urine ratio  Erectile dysfunction, unspecified erectile dysfunction type - Plan: tadalafil (CIALIS) 20 MG tablet  Screen for colon cancer - Plan: Ambulatory referral to Gastroenterology  Referral to physical therapy.  Stretches and exercises.  Heat, ice, Tylenol. He is diet controlled currently.  Will check labs when he decides to return when  he is fasting. Continue Cialis. Refer to gastroenterology for colon cancer screening. Patient should return pending the above lab results. The patient voiced understanding and agreement to the plan.   [place], DO 05/28/19  7:19 PM

## 2019-05-28 NOTE — Patient Instructions (Addendum)
Keep the diet clean and stay active.  No greasy/deep-fried foods 24-48 hrs prior to lab draw.   If you do not hear anything about your referral in the next 1-2 weeks, call our office and ask for an update.  EXERCISES  RANGE OF MOTION (ROM) AND STRETCHING EXERCISES - Low Back Pain Most people with lower back pain will find that their symptoms get worse with excessive bending forward (flexion) or arching at the lower back (extension). The exercises that will help resolve your symptoms will focus on the opposite motion.  If you have pain, numbness or tingling which travels down into your buttocks, leg or foot, the goal of the therapy is for these symptoms to move closer to your back and eventually resolve. Sometimes, these leg symptoms will get better, but your lower back pain may worsen. This is often an indication of progress in your rehabilitation. Be very alert to any changes in your symptoms and the activities in which you participated in the 24 hours prior to the change. Sharing this information with your caregiver will allow him or her to most efficiently treat your condition. These exercises may help you when beginning to rehabilitate your injury. Your symptoms may resolve with or without further involvement from your physician, physical therapist or athletic trainer. While completing these exercises, remember:   Restoring tissue flexibility helps normal motion to return to the joints. This allows healthier, less painful movement and activity.  An effective stretch should be held for at least 30 seconds.  A stretch should never be painful. You should only feel a gentle lengthening or release in the stretched tissue. FLEXION RANGE OF MOTION AND STRETCHING EXERCISES:  STRETCH - Flexion, Single Knee to Chest   Lie on a firm bed or floor with both legs extended in front of you.  Keeping one leg in contact with the floor, bring your opposite knee to your chest. Hold your leg in place by either  grabbing behind your thigh or at your knee.  Pull until you feel a gentle stretch in your low back. Hold 30 seconds.  Slowly release your grasp and repeat the exercise with the opposite side. Repeat 2 times. Complete this exercise 3 times per week.   STRETCH - Flexion, Double Knee to Chest  Lie on a firm bed or floor with both legs extended in front of you.  Keeping one leg in contact with the floor, bring your opposite knee to your chest.  Tense your stomach muscles to support your back and then lift your other knee to your chest. Hold your legs in place by either grabbing behind your thighs or at your knees.  Pull both knees toward your chest until you feel a gentle stretch in your low back. Hold 30 seconds.  Tense your stomach muscles and slowly return one leg at a time to the floor. Repeat 2 times. Complete this exercise 3 times per week.   STRETCH - Low Trunk Rotation  Lie on a firm bed or floor. Keeping your legs in front of you, bend your knees so they are both pointed toward the ceiling and your feet are flat on the floor.  Extend your arms out to the side. This will stabilize your upper body by keeping your shoulders in contact with the floor.  Gently and slowly drop both knees together to one side until you feel a gentle stretch in your low back. Hold for 30 seconds.  Tense your stomach muscles to support your lower  back as you bring your knees back to the starting position. Repeat the exercise to the other side. Repeat 2 times. Complete this exercise at least 3 times per week.   EXTENSION RANGE OF MOTION AND FLEXIBILITY EXERCISES:  STRETCH - Extension, Prone on Elbows   Lie on your stomach on the floor, a bed will be too soft. Place your palms about shoulder width apart and at the height of your head.  Place your elbows under your shoulders. If this is too painful, stack pillows under your chest.  Allow your body to relax so that your hips drop lower and make contact  more completely with the floor.  Hold this position for 30 seconds.  Slowly return to lying flat on the floor. Repeat 2 times. Complete this exercise 3 times per week.   RANGE OF MOTION - Extension, Prone Press Ups  Lie on your stomach on the floor, a bed will be too soft. Place your palms about shoulder width apart and at the height of your head.  Keeping your back as relaxed as possible, slowly straighten your elbows while keeping your hips on the floor. You may adjust the placement of your hands to maximize your comfort. As you gain motion, your hands will come more underneath your shoulders.  Hold this position 30 seconds.  Slowly return to lying flat on the floor. Repeat 2 times. Complete this exercise 3 times per week.   RANGE OF MOTION- Quadruped, Neutral Spine   Assume a hands and knees position on a firm surface. Keep your hands under your shoulders and your knees under your hips. You may place padding under your knees for comfort.  Drop your head and point your tailbone toward the ground below you. This will round out your lower back like an angry cat. Hold this position for 30 seconds.  Slowly lift your head and release your tail bone so that your back sags into a large arch, like an old horse.  Hold this position for 30 seconds.  Repeat this until you feel limber in your low back.  Now, find your "sweet spot." This will be the most comfortable position somewhere between the two previous positions. This is your neutral spine. Once you have found this position, tense your stomach muscles to support your low back.  Hold this position for 30 seconds. Repeat 2 times. Complete this exercise 3 times per week.   STRENGTHENING EXERCISES - Low Back Sprain These exercises may help you when beginning to rehabilitate your injury. These exercises should be done near your "sweet spot." This is the neutral, low-back arch, somewhere between fully rounded and fully arched, that is your  least painful position. When performed in this safe range of motion, these exercises can be used for people who have either a flexion or extension based injury. These exercises may resolve your symptoms with or without further involvement from your physician, physical therapist or athletic trainer. While completing these exercises, remember:   Muscles can gain both the endurance and the strength needed for everyday activities through controlled exercises.  Complete these exercises as instructed by your physician, physical therapist or athletic trainer. Increase the resistance and repetitions only as guided.  You may experience muscle soreness or fatigue, but the pain or discomfort you are trying to eliminate should never worsen during these exercises. If this pain does worsen, stop and make certain you are following the directions exactly. If the pain is still present after adjustments, discontinue the exercise until  you can discuss the trouble with your caregiver.  STRENGTHENING - Deep Abdominals, Pelvic Tilt   Lie on a firm bed or floor. Keeping your legs in front of you, bend your knees so they are both pointed toward the ceiling and your feet are flat on the floor.  Tense your lower abdominal muscles to press your low back into the floor. This motion will rotate your pelvis so that your tail bone is scooping upwards rather than pointing at your feet or into the floor. With a gentle tension and even breathing, hold this position for 3 seconds. Repeat 2 times. Complete this exercise 3 times per week.   STRENGTHENING - Abdominals, Crunches   Lie on a firm bed or floor. Keeping your legs in front of you, bend your knees so they are both pointed toward the ceiling and your feet are flat on the floor. Cross your arms over your chest.  Slightly tip your chin down without bending your neck.  Tense your abdominals and slowly lift your trunk high enough to just clear your shoulder blades. Lifting  higher can put excessive stress on the lower back and does not further strengthen your abdominal muscles.  Control your return to the starting position. Repeat 2 times. Complete this exercise 3 times per week.   STRENGTHENING - Quadruped, Opposite UE/LE Lift   Assume a hands and knees position on a firm surface. Keep your hands under your shoulders and your knees under your hips. You may place padding under your knees for comfort.  Find your neutral spine and gently tense your abdominal muscles so that you can maintain this position. Your shoulders and hips should form a rectangle that is parallel with the floor and is not twisted.  Keeping your trunk steady, lift your right hand no higher than your shoulder and then your left leg no higher than your hip. Make sure you are not holding your breath. Hold this position for 30 seconds.  Continuing to keep your abdominal muscles tense and your back steady, slowly return to your starting position. Repeat with the opposite arm and leg. Repeat 2 times. Complete this exercise 3 times per week.   STRENGTHENING - Abdominals and Quadriceps, Straight Leg Raise   Lie on a firm bed or floor with both legs extended in front of you.  Keeping one leg in contact with the floor, bend the other knee so that your foot can rest flat on the floor.  Find your neutral spine, and tense your abdominal muscles to maintain your spinal position throughout the exercise.  Slowly lift your straight leg off the floor about 6 inches for a count of 3, making sure to not hold your breath.  Still keeping your neutral spine, slowly lower your leg all the way to the floor. Repeat this exercise with each leg 2 times. Complete this exercise 3 times per week.  POSTURE AND BODY MECHANICS CONSIDERATIONS - Low Back Sprain Keeping correct posture when sitting, standing or completing your activities will reduce the stress put on different body tissues, allowing injured tissues a  chance to heal and limiting painful experiences. The following are general guidelines for improved posture.  While reading these guidelines, remember:  The exercises prescribed by your provider will help you have the flexibility and strength to maintain correct postures.  The correct posture provides the best environment for your joints to work. All of your joints have less wear and tear when properly supported by a spine with good posture.  This means you will experience a healthier, less painful body.  Correct posture must be practiced with all of your activities, especially prolonged sitting and standing. Correct posture is as important when doing repetitive low-stress activities (typing) as it is when doing a single heavy-load activity (lifting).  RESTING POSITIONS Consider which positions are most painful for you when choosing a resting position. If you have pain with flexion-based activities (sitting, bending, stooping, squatting), choose a position that allows you to rest in a less flexed posture. You would want to avoid curling into a fetal position on your side. If your pain worsens with extension-based activities (prolonged standing, working overhead), avoid resting in an extended position such as sleeping on your stomach. Most people will find more comfort when they rest with their spine in a more neutral position, neither too rounded nor too arched. Lying on a non-sagging bed on your side with a pillow between your knees, or on your back with a pillow under your knees will often provide some relief. Keep in mind, being in any one position for a prolonged period of time, no matter how correct your posture, can still lead to stiffness.  PROPER SITTING POSTURE In order to minimize stress and discomfort on your spine, you must sit with correct posture. Sitting with good posture should be effortless for a healthy body. Returning to good posture is a gradual process. Many people can work toward this  most comfortably by using various supports until they have the flexibility and strength to maintain this posture on their own. When sitting with proper posture, your ears will fall over your shoulders and your shoulders will fall over your hips. You should use the back of the chair to support your upper back. Your lower back will be in a neutral position, just slightly arched. You may place a small pillow or folded towel at the base of your lower back for  support.  When working at a desk, create an environment that supports good, upright posture. Without extra support, muscles tire, which leads to excessive strain on joints and other tissues. Keep these recommendations in mind:  CHAIR:  A chair should be able to slide under your desk when your back makes contact with the back of the chair. This allows you to work closely.  The chair's height should allow your eyes to be level with the upper part of your monitor and your hands to be slightly lower than your elbows.  BODY POSITION  Your feet should make contact with the floor. If this is not possible, use a foot rest.  Keep your ears over your shoulders. This will reduce stress on your neck and low back.  INCORRECT SITTING POSTURES  If you are feeling tired and unable to assume a healthy sitting posture, do not slouch or slump. This puts excessive strain on your back tissues, causing more damage and pain. Healthier options include:  Using more support, like a lumbar pillow.  Switching tasks to something that requires you to be upright or walking.  Talking a brief walk.  Lying down to rest in a neutral-spine position.  PROLONGED STANDING WHILE SLIGHTLY LEANING FORWARD  When completing a task that requires you to lean forward while standing in one place for a long time, place either foot up on a stationary 2-4 inch high object to help maintain the best posture. When both feet are on the ground, the lower back tends to lose its slight inward  curve. If this curve flattens (  or becomes too large), then the back and your other joints will experience too much stress, tire more quickly, and can cause pain.  CORRECT STANDING POSTURES Proper standing posture should be assumed with all daily activities, even if they only take a few moments, like when brushing your teeth. As in sitting, your ears should fall over your shoulders and your shoulders should fall over your hips. You should keep a slight tension in your abdominal muscles to brace your spine. Your tailbone should point down to the ground, not behind your body, resulting in an over-extended swayback posture.   INCORRECT STANDING POSTURES  Common incorrect standing postures include a forward head, locked knees and/or an excessive swayback. WALKING Walk with an upright posture. Your ears, shoulders and hips should all line-up.  PROLONGED ACTIVITY IN A FLEXED POSITION When completing a task that requires you to bend forward at your waist or lean over a low surface, try to find a way to stabilize 3 out of 4 of your limbs. You can place a hand or elbow on your thigh or rest a knee on the surface you are reaching across. This will provide you more stability, so that your muscles do not tire as quickly. By keeping your knees relaxed, or slightly bent, you will also reduce stress across your lower back. CORRECT LIFTING TECHNIQUES  DO :  Assume a wide stance. This will provide you more stability and the opportunity to get as close as possible to the object which you are lifting.  Tense your abdominals to brace your spine. Bend at the knees and hips. Keeping your back locked in a neutral-spine position, lift using your leg muscles. Lift with your legs, keeping your back straight.  Test the weight of unknown objects before attempting to lift them.  Try to keep your elbows locked down at your sides in order get the best strength from your shoulders when carrying an object.     Always ask  for help when lifting heavy or awkward objects. INCORRECT LIFTING TECHNIQUES DO NOT:   Lock your knees when lifting, even if it is a small object.  Bend and twist. Pivot at your feet or move your feet when needing to change directions.  Assume that you can safely pick up even a paperclip without proper posture.

## 2019-06-07 ENCOUNTER — Other Ambulatory Visit: Payer: Self-pay

## 2019-06-12 ENCOUNTER — Other Ambulatory Visit: Payer: Medicare Other

## 2019-06-15 ENCOUNTER — Other Ambulatory Visit: Payer: Self-pay

## 2019-06-15 ENCOUNTER — Ambulatory Visit: Payer: Medicare Other | Attending: Family Medicine

## 2019-06-15 DIAGNOSIS — M25551 Pain in right hip: Secondary | ICD-10-CM | POA: Diagnosis present

## 2019-06-15 DIAGNOSIS — R262 Difficulty in walking, not elsewhere classified: Secondary | ICD-10-CM

## 2019-06-15 DIAGNOSIS — M545 Low back pain, unspecified: Secondary | ICD-10-CM

## 2019-06-15 DIAGNOSIS — M25552 Pain in left hip: Secondary | ICD-10-CM | POA: Diagnosis present
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
## 2019-06-19 ENCOUNTER — Ambulatory Visit: Payer: Medicare Other | Admitting: Physical Therapy

## 2019-06-19 ENCOUNTER — Other Ambulatory Visit: Payer: Self-pay

## 2019-06-19 ENCOUNTER — Encounter: Payer: Self-pay | Admitting: Physical Therapy

## 2019-06-19 DIAGNOSIS — M545 Low back pain, unspecified: Secondary | ICD-10-CM

## 2019-06-19 DIAGNOSIS — R262 Difficulty in walking, not elsewhere classified: Secondary | ICD-10-CM

## 2019-06-19 DIAGNOSIS — M25552 Pain in left hip: Secondary | ICD-10-CM

## 2019-06-19 DIAGNOSIS — M25551 Pain in right hip: Secondary | ICD-10-CM

## 2019-06-19 NOTE — Therapy (Signed)
[address], [place], [address] Phone: [phone]   Fax:  [phone]  Physical Therapy Treatment  Patient Details  Name: Noah Lane MRN: [id number] Date of Birth: 1955/12/22 Referring Provider (PT): Riki Sheer, DO   Encounter Date: 06/19/2019  PT End of Session - 06/19/19 1601    Visit Number  2    Number of Visits  16    Date for PT Re-Evaluation  08/10/19    Authorization Type  Medicare    PT Start Time  [id number]    PT Stop Time  1600    PT Time Calculation (min)  45 min    Activity Tolerance  Patient tolerated treatment well    Behavior During Therapy  [facility] for tasks assessed/performed       Past Medical History:  Diagnosis Date  . Diabetes mellitus without complication [facility])     Past Surgical History:  Procedure Laterality Date  . HERNIA REPAIR  2012    There were no vitals filed for this visit.  Subjective Assessment - 06/19/19 1518    Subjective  Back and hips is very painful and sore when he is trying to sleep. In the day he is all right a little achy    Currently in Pain?  No/denies                       [facility] Adult PT Treatment/Exercise - 06/19/19 0001      Lumbar Exercises: Stretches   Passive Hamstring Stretch  Left;Right;4 reps;20 seconds;10 seconds    Single Knee to Chest Stretch  3 reps;20 seconds    Piriformis Stretch  Left;Right;3 reps;10 seconds;20 seconds      Lumbar Exercises: Aerobic   Recumbent Bike  L1 x 5 min    Nustep  L5 x 6 min       Lumbar Exercises: Machines for Strengthening   Leg Press  30lb 2x15    Other Lumbar Machine Exercise  rows & Lats 25lb 2x10       Lumbar Exercises: Standing   Other Standing Lumbar Exercises  4 way resisted gait 30lb x 3 each               PT Short Term Goals - 06/19/19 1601      PT SHORT TERM GOAL #1   Title  Pt will be I and compliant with initial HEP.    Status  Achieved         PT Long Term Goals - 06/15/19 1052      PT LONG TERM GOAL #1   Title  Pt will increase lumbar AROM to [facility] with no pain.    Baseline  deficits with report of L sided LBP    Time  8    Period  Weeks    Status  New    Target Date  08/10/19      PT LONG TERM GOAL #2   Title  Pt will report ability to walk dogs for at least 30 minutes at a time with no onset of BLE weakness.    Baseline  10 min    Time  8    Period  Weeks    Status  New    Target Date  08/10/19      PT LONG TERM GOAL #3   Title  Pt will be independent with advanced HEP + progressions for long term maintenance.  Time  8    Period  Weeks    Status  New    Target Date  08/10/19            Plan - 06/19/19 1601    Clinical Impression Statement  Pt tolerated an initial progression to TE well. Good strength and ROM displayed through session. Some instability noted with resisted side steps. He reports some balance issues. Bilateral HS tightness noted with stretching. Pt reports lacking LE endurance stating his legs are gone at he end of the day. Postal cues required with seated rows and lats.    Personal Factors and Comorbidities  Age;Comorbidity 1;Past/Current Experience    Comorbidities  DM T2    Examination-Activity Limitations  Locomotion Level;Sleep;Stand    Examination-Participation Restrictions  Community Activity    Stability/Clinical Decision Making  Stable/Uncomplicated    Rehab Potential  Good    PT Frequency  2x / week    PT Duration  8 weeks    PT Treatment/Interventions  Traction;Spinal Manipulations;Joint Manipulations;Dry needling;Passive range of motion;Manual techniques;Patient/family education;Neuromuscular re-education;Balance training;Therapeutic exercise;Functional mobility training;Therapeutic activities;Moist Heat;Cryotherapy    PT Next Visit Plan  LE strengthening and Lumbar flexibility       Patient will benefit from skilled therapeutic intervention in order to improve the  following deficits and impairments:  Hypomobility, Increased fascial restricitons, Impaired flexibility, Impaired perceived functional ability, Postural dysfunction, Decreased mobility, Decreased activity tolerance  Visit Diagnosis: Pain in left hip  Pain in right hip  Acute bilateral low back pain without sciatica  Difficulty in walking, not elsewhere classified     Problem List Patient Active Problem List   Diagnosis Date Noted  . Hip pain, bilateral 05/28/2019  . Chronic bilateral low back pain without sciatica 05/28/2019  . Diabetes mellitus type 2 in obese ([place]) 05/28/2019  . Erectile dysfunction 05/28/2019  . Pain and swelling of left knee 03/12/2019  . Lateral epicondylitis, right elbow 03/15/2017    Scot Jun, PTA 06/19/2019, 4:07 PM  [place], [place], [address] Phone: [phone]   Fax:  [phone]  Name: [address] MRN: [id number] Date of Birth: [birth year]

## 2019-06-21 ENCOUNTER — Other Ambulatory Visit (INDEPENDENT_AMBULATORY_CARE_PROVIDER_SITE_OTHER): Payer: Medicare Other

## 2019-06-21 ENCOUNTER — Other Ambulatory Visit: Payer: Self-pay

## 2019-06-21 DIAGNOSIS — E1169 Type 2 diabetes mellitus with other specified complication: Secondary | ICD-10-CM | POA: Diagnosis not present

## 2019-06-21 DIAGNOSIS — E669 Obesity, unspecified: Secondary | ICD-10-CM

## 2019-06-21 LAB — LIPID PANEL
Cholesterol: 199 mg/dL (ref 0–200)
HDL: 41.3 mg/dL
LDL Cholesterol: 125 mg/dL — ABNORMAL HIGH (ref 0–99)
NonHDL: 157.33
Total CHOL/HDL Ratio: 5
Triglycerides: 163 mg/dL — ABNORMAL HIGH (ref 0.0–149.0)
VLDL: 32.6 mg/dL (ref 0.0–40.0)

## 2019-06-21 LAB — MICROALBUMIN / CREATININE URINE RATIO
Creatinine,U: 132.3 mg/dL
Microalb Creat Ratio: 4.1 mg/g (ref 0.0–30.0)
Microalb, Ur: 5.5 mg/dL — ABNORMAL HIGH (ref 0.0–1.9)

## 2019-06-21 LAB — HEMOGLOBIN A1C: Hgb A1c MFr Bld: 6.6 % — ABNORMAL HIGH (ref 4.6–6.5)

## 2019-06-22 ENCOUNTER — Encounter: Payer: Self-pay | Admitting: Physical Therapy

## 2019-06-22 ENCOUNTER — Ambulatory Visit: Payer: Medicare Other | Admitting: Physical Therapy

## 2019-06-22 DIAGNOSIS — M25551 Pain in right hip: Secondary | ICD-10-CM

## 2019-06-22 DIAGNOSIS — M545 Low back pain, unspecified: Secondary | ICD-10-CM

## 2019-06-22 DIAGNOSIS — M25552 Pain in left hip: Secondary | ICD-10-CM

## 2019-06-22 DIAGNOSIS — R262 Difficulty in walking, not elsewhere classified: Secondary | ICD-10-CM

## 2019-06-22 NOTE — Therapy (Signed)
[place], [place], [address] Phone: [phone]   Fax:  [phone]  Physical Therapy Treatment  Patient Details  Name: Noah Lane MRN: [id number] Date of Birth: May 13, 1955 Referring Provider (PT): Riki Sheer, DO   Encounter Date: 06/22/2019  PT End of Session - 06/22/19 0849    Visit Number  3    Number of Visits  16    Date for PT Re-Evaluation  08/10/19    Authorization Type  Medicare    PT Start Time  0800    PT Stop Time  0846    PT Time Calculation (min)  46 min    Activity Tolerance  Patient tolerated treatment well    Behavior During Therapy  [facility] for tasks assessed/performed       Past Medical History:  Diagnosis Date  . Diabetes mellitus without complication [facility])     Past Surgical History:  Procedure Laterality Date  . HERNIA REPAIR  2012    There were no vitals filed for this visit.  Subjective Assessment - 06/22/19 0801    Subjective  Left LB and hip are very sore this morning; still having trouble sleeping. Pt feels better during the day.    Patient Stated Goals  Return to activity to lose weight    Currently in Pain?  Yes    Pain Score  7     Pain Location  Back                       [facility] Adult PT Treatment/Exercise - 06/22/19 0001      Lumbar Exercises: Stretches   Active Hamstring Stretch  Right;Left;1 rep;30 seconds    Passive Hamstring Stretch  Right;Left;2 reps;30 seconds    Double Knee to Chest Stretch  5 reps;10 seconds    Double Knee to Chest Stretch Limitations  cues to engage core with slow eccentric lowering of BLE      Lumbar Exercises: Aerobic   Nustep  L5 x 8 min      Lumbar Exercises: Machines for Strengthening   Leg Press  40lb 1x15, 20lb LLE and RLE 1x15      Lumbar Exercises: Seated   Sit to Stand  5 reps    Sit to Stand Limitations  5x2 with blue medicine ball and cues for eccentric control      Manual  Therapy   Manual Therapy  Soft tissue mobilization;Passive ROM    Soft tissue mobilization  STM to L lumbar paraspinals, glute/piriformis    Passive ROM  prone quad stretch on L               PT Short Term Goals - 06/19/19 1601      PT SHORT TERM GOAL #1   Title  Pt will be I and compliant with initial HEP.    Status  Achieved        PT Long Term Goals - 06/15/19 1052      PT LONG TERM GOAL #1   Title  Pt will increase lumbar AROM to [facility] with no pain.    Baseline  deficits with report of L sided LBP    Time  8    Period  Weeks    Status  New    Target Date  08/10/19      PT LONG TERM GOAL #2   Title  Pt will report ability to walk dogs for at  least 30 minutes at a time with no onset of BLE weakness.    Baseline  10 min    Time  8    Period  Weeks    Status  New    Target Date  08/10/19      PT LONG TERM GOAL #3   Title  Pt will be independent with advanced HEP + progressions for long term maintenance.    Time  8    Period  Weeks    Status  New    Target Date  08/10/19            Plan - 06/22/19 0850    Clinical Impression Statement  Pt tolerated progression of TE exercises. Bilateral hamstring tightness present; tender to palpation in L lumbar paraspinals and glute/pirifromis. Pt may benefit from DN session in future rx; pt expressed interest in DN. Continue to progress LE and lumbar exercises.    PT Treatment/Interventions  Traction;Spinal Manipulations;Joint Manipulations;Dry needling;Passive range of motion;Manual techniques;Patient/family education;Neuromuscular re-education;Balance training;Therapeutic exercise;Functional mobility training;Therapeutic activities;Moist Heat;Cryotherapy    PT Next Visit Plan  LE strenghtening and Lumbar flexibility. Manual as indicated. Possible DN in future rx.       Patient will benefit from skilled therapeutic intervention in order to improve the following deficits and impairments:  Hypomobility, Increased fascial  restricitons, Impaired flexibility, Impaired perceived functional ability, Postural dysfunction, Decreased mobility, Decreased activity tolerance  Visit Diagnosis: Pain in left hip  Pain in right hip  Acute bilateral low back pain without sciatica  Difficulty in walking, not elsewhere classified     Problem List Patient Active Problem List   Diagnosis Date Noted  . Hip pain, bilateral 05/28/2019  . Chronic bilateral low back pain without sciatica 05/28/2019  . Diabetes mellitus type 2 in obese ([place]) 05/28/2019  . Erectile dysfunction 05/28/2019  . Pain and swelling of left knee 03/12/2019  . Lateral epicondylitis, right elbow 03/15/2017   Amador Cunas, PT, DPT Donald Prose Davontae Prusinski 06/22/2019, 8:53 AM  [place] [address] [place], [place], [address] Phone: [phone]   Fax:  [phone]  Name: Vitor Sondgeroth MRN: [id number] Date of Birth: 1955/11/10

## 2019-06-25 ENCOUNTER — Other Ambulatory Visit: Payer: Self-pay

## 2019-06-25 ENCOUNTER — Encounter: Payer: Self-pay | Admitting: Physical Therapy

## 2019-06-25 ENCOUNTER — Ambulatory Visit: Payer: Medicare Other | Admitting: Physical Therapy

## 2019-06-25 DIAGNOSIS — M545 Low back pain, unspecified: Secondary | ICD-10-CM

## 2019-06-25 DIAGNOSIS — R262 Difficulty in walking, not elsewhere classified: Secondary | ICD-10-CM

## 2019-06-25 DIAGNOSIS — M25552 Pain in left hip: Secondary | ICD-10-CM

## 2019-06-25 NOTE — Therapy (Signed)
[place], [place], [address] Phone: [phone]   Fax:  [phone]  Physical Therapy Treatment  Patient Details  Name: [address] Lane MRN: [id number] Date of Birth: Jul 29, 1955 Referring Provider (PT): Riki Sheer, DO   Encounter Date: 06/25/2019  PT End of Session - 06/25/19 0852    Visit Number  4    Number of Visits  16    Date for PT Re-Evaluation  08/10/19    Authorization Type  Medicare    PT Start Time  0805    PT Stop Time  0848    PT Time Calculation (min)  43 min    Activity Tolerance  Patient tolerated treatment well    Behavior During Therapy  [facility] for tasks assessed/performed       Past Medical History:  Diagnosis Date  . Diabetes mellitus without complication [facility])     Past Surgical History:  Procedure Laterality Date  . HERNIA REPAIR  2012    There were no vitals filed for this visit.  Subjective Assessment - 06/25/19 0810    Subjective  Left LB and hip are feeling better this morning; pt reports some soreness after last tx which resolved within a day. Pt reports he no longer needs to use heat at night to get to sleep.    Pertinent History  L knee cellulitis, T2 DM    Patient Stated Goals  Return to activity to lose weight    Currently in Pain?  Yes    Pain Score  7     Pain Location  Back                       [facility] Adult PT Treatment/Exercise - 06/25/19 0001      Lumbar Exercises: Stretches   Active Hamstring Stretch  Right;Left;1 rep;30 seconds    Double Knee to Chest Stretch  5 reps;10 seconds    Double Knee to Chest Stretch Limitations  cues to engage core with slow eccentric lowering of BLE    Piriformis Stretch  Left;Right;3 reps;10 seconds;20 seconds      Lumbar Exercises: Aerobic   Nustep  L5 x 8 min      Lumbar Exercises: Machines for Strengthening   Leg Press  40lb 1x15, 20lb LLE and RLE 1x15    Other Lumbar Machine Exercise   Lats 35lb 2x10      Lumbar Exercises: Supine   Bridge  10 reps;3 seconds               PT Short Term Goals - 06/19/19 1601      PT SHORT TERM GOAL #1   Title  Pt will be I and compliant with initial HEP.    Status  Achieved        PT Long Term Goals - 06/15/19 1052      PT LONG TERM GOAL #1   Title  Pt will increase lumbar AROM to [facility] with no pain.    Baseline  deficits with report of L sided LBP    Time  8    Period  Weeks    Status  New    Target Date  08/10/19      PT LONG TERM GOAL #2   Title  Pt will report ability to walk dogs for at least 30 minutes at a time with no onset of BLE weakness.    Baseline  10 min  Time  8    Period  Weeks    Status  New    Target Date  08/10/19      PT LONG TERM GOAL #3   Title  Pt will be independent with advanced HEP + progressions for long term maintenance.    Time  8    Period  Weeks    Status  New    Target Date  08/10/19            Plan - 06/25/19 [id number]    Clinical Impression Statement  Pt tolerated progression of TE exercises. B hamstrings remain tight; continued to work on lumbar and LE flexibility. Assess need for DN and STM in future rx. Continue to progress LE stabilization and lumbar ex's.    PT Treatment/Interventions  Traction;Spinal Manipulations;Joint Manipulations;Dry needling;Passive range of motion;Manual techniques;Patient/family education;Neuromuscular re-education;Balance training;Therapeutic exercise;Functional mobility training;Therapeutic activities;Moist Heat;Cryotherapy    PT Next Visit Plan  LE strenghtening and Lumbar flexibility. Manual as indicated. Possible DN in future rx.       Patient will benefit from skilled therapeutic intervention in order to improve the following deficits and impairments:  Hypomobility, Increased fascial restricitons, Impaired flexibility, Impaired perceived functional ability, Postural dysfunction, Decreased mobility, Decreased activity tolerance  Visit  Diagnosis: Pain in left hip  Acute bilateral low back pain without sciatica  Difficulty in walking, not elsewhere classified     Problem List Patient Active Problem List   Diagnosis Date Noted  . Hip pain, bilateral 05/28/2019  . Chronic bilateral low back pain without sciatica 05/28/2019  . Diabetes mellitus type 2 in obese ([place]) 05/28/2019  . Erectile dysfunction 05/28/2019  . Pain and swelling of left knee 03/12/2019  . Lateral epicondylitis, right elbow 03/15/2017   Amador Cunas, PT, DPT Donald Prose Autumm Hattery 06/25/2019, 8:54 AM  [place] [address] [place], [place], [address] Phone: [phone]   Fax:  [phone]  Name: [address] Lane MRN: [id number] Date of Birth: 19-Sep-1955

## 2019-06-27 ENCOUNTER — Ambulatory Visit: Payer: Medicare Other | Admitting: Physical Therapy

## 2019-06-27 ENCOUNTER — Encounter: Payer: Self-pay | Admitting: Physical Therapy

## 2019-06-27 ENCOUNTER — Other Ambulatory Visit: Payer: Self-pay

## 2019-06-27 DIAGNOSIS — M25552 Pain in left hip: Secondary | ICD-10-CM

## 2019-06-27 DIAGNOSIS — M545 Low back pain, unspecified: Secondary | ICD-10-CM

## 2019-06-27 DIAGNOSIS — R262 Difficulty in walking, not elsewhere classified: Secondary | ICD-10-CM

## 2019-06-27 DIAGNOSIS — M25551 Pain in right hip: Secondary | ICD-10-CM

## 2019-06-27 NOTE — Therapy (Signed)
[place], [place], [address] Phone: [phone]   Fax:  [phone]  Physical Therapy Treatment  Patient Details  Name: [address] Lane MRN: [id number] Date of Birth: [birth year] Referring Provider (PT): Riki Sheer, DO   Encounter Date: 06/27/2019  PT End of Session - 06/27/19 0850    Visit Number  5    Number of Visits  16    Date for PT Re-Evaluation  08/10/19    Authorization Type  Medicare    PT Start Time  0800    PT Stop Time  0845    PT Time Calculation (min)  45 min    Activity Tolerance  Patient tolerated treatment well    Behavior During Therapy  [facility] for tasks assessed/performed       Past Medical History:  Diagnosis Date  . Diabetes mellitus without complication [facility])     Past Surgical History:  Procedure Laterality Date  . HERNIA REPAIR  2012    There were no vitals filed for this visit.  Subjective Assessment - 06/27/19 0800    Subjective  B LB and hip are sore from gardening but pt does not report pain. Pt reports he has a golf tournament coming up and would like to start working on some rotational strengthening to prepare.    How long can you walk comfortably?  20 minutes    Currently in Pain?  No/denies    Pain Score  0-No pain    Pain Location  Back                       [facility] Adult PT Treatment/Exercise - 06/27/19 0001      Lumbar Exercises: Stretches   Passive Hamstring Stretch  Right;Left;2 reps;30 seconds    Double Knee to Chest Stretch  5 reps;10 seconds    Double Knee to Chest Stretch Limitations  cues to engage core with slow eccentric lowering of BLE    Other Lumbar Stretch Exercise  SKTC with rotation to other side for piriformis/lumbar stretch x3 20 sec      Lumbar Exercises: Aerobic   Nustep  L5 x 8 min      Lumbar Exercises: Machines for Strengthening   Leg Press  40lb 1x15, 20lb LLE and RLE 1x15    Other Lumbar Machine  Exercise  rows and lats 35lb 2x10      Lumbar Exercises: Standing   Other Standing Lumbar Exercises  lumbar rotation on multiuse machine neutral, up, and down rotations 15# 5x each direction               PT Short Term Goals - 06/19/19 1601      PT SHORT TERM GOAL #1   Title  Pt will be I and compliant with initial HEP.    Status  Achieved        PT Long Term Goals - 06/15/19 1052      PT LONG TERM GOAL #1   Title  Pt will increase lumbar AROM to [facility] with no pain.    Baseline  deficits with report of L sided LBP    Time  8    Period  Weeks    Status  New    Target Date  08/10/19      PT LONG TERM GOAL #2   Title  Pt will report ability to walk dogs for at least 30 minutes at a time with  no onset of BLE weakness.    Baseline  10 min    Time  8    Period  Weeks    Status  New    Target Date  08/10/19      PT LONG TERM GOAL #3   Title  Pt will be independent with advanced HEP + progressions for long term maintenance.    Time  8    Period  Weeks    Status  New    Target Date  08/10/19            Plan - 06/27/19 0850    Clinical Impression Statement  Incorporated more rotational lumbar strengthening and flexibility ex's into today's treatment d/t pt report of desire to return to playing golf. Continue to progress LE stabilization and lumbar ex's. Manual as indicated.    PT Treatment/Interventions  Traction;Spinal Manipulations;Joint Manipulations;Dry needling;Passive range of motion;Manual techniques;Patient/family education;Neuromuscular re-education;Balance training;Therapeutic exercise;Functional mobility training;Therapeutic activities;Moist Heat;Cryotherapy    PT Next Visit Plan  LE strenghtening and Lumbar flexibility. Manual as indicated. Possible DN in future rx.    Consulted and Agree with Plan of Care  Patient       Patient will benefit from skilled therapeutic intervention in order to improve the following deficits and impairments:     Visit  Diagnosis: Pain in left hip  Acute bilateral low back pain without sciatica  Difficulty in walking, not elsewhere classified  Pain in right hip     Problem List Patient Active Problem List   Diagnosis Date Noted  . Hip pain, bilateral 05/28/2019  . Chronic bilateral low back pain without sciatica 05/28/2019  . Diabetes mellitus type 2 in obese ([place]) 05/28/2019  . Erectile dysfunction 05/28/2019  . Pain and swelling of left knee 03/12/2019  . Lateral epicondylitis, right elbow 03/15/2017   Amador Cunas, PT, DPT Donald Prose Finola Rosal 06/27/2019, 8:53 AM  [place] [address] [place], [place], [address] Phone: [phone]   Fax:  [phone]  Name: Noah Lane MRN: [id number] Date of Birth: 11/18/1955

## 2019-07-02 ENCOUNTER — Encounter: Payer: Self-pay | Admitting: Physical Therapy

## 2019-07-02 ENCOUNTER — Other Ambulatory Visit: Payer: Self-pay

## 2019-07-02 ENCOUNTER — Ambulatory Visit: Payer: Medicare Other | Attending: Family Medicine | Admitting: Physical Therapy

## 2019-07-02 DIAGNOSIS — M545 Low back pain, unspecified: Secondary | ICD-10-CM

## 2019-07-02 DIAGNOSIS — R262 Difficulty in walking, not elsewhere classified: Secondary | ICD-10-CM | POA: Diagnosis present

## 2019-07-02 DIAGNOSIS — M25551 Pain in right hip: Secondary | ICD-10-CM | POA: Insufficient documentation

## 2019-07-02 DIAGNOSIS — M25552 Pain in left hip: Secondary | ICD-10-CM | POA: Diagnosis present

## 2019-07-02 NOTE — Therapy (Signed)
[place], [place], [address] Phone: [phone]   Fax:  [phone]  Physical Therapy Treatment  Patient Details  Name: Noah Lane MRN: [id number] Date of Birth: 10/01/1955 Referring Provider (PT): Riki Sheer, DO   Encounter Date: 07/02/2019  PT End of Session - 07/02/19 0839    Visit Number  6    Number of Visits  16    Date for PT Re-Evaluation  08/10/19    Authorization Type  Medicare    PT Start Time  [id number]    PT Stop Time  0836    PT Time Calculation (min)  41 min    Activity Tolerance  Patient limited by fatigue    Behavior During Therapy  [facility] for tasks assessed/performed       Past Medical History:  Diagnosis Date  . Diabetes mellitus without complication [facility])     Past Surgical History:  Procedure Laterality Date  . HERNIA REPAIR  2012    There were no vitals filed for this visit.  Subjective Assessment - 07/02/19 0754    Subjective  Pt reports that he did a lot of heavy lifting in the yard over the weeend; is a little sore but feels that he is doing better overall.    Pertinent History  L knee cellulitis, T2 DM    Currently in Pain?  No/denies    Pain Score  0-No pain    Pain Location  Back                       [facility] Adult PT Treatment/Exercise - 07/02/19 0001      Lumbar Exercises: Stretches   Quad Stretch  Right;Left;1 rep;30 seconds    Quad Stretch Limitations  prone      Lumbar Exercises: Aerobic   Nustep  L5 x 8 min      Lumbar Exercises: Standing   Other Standing Lumbar Exercises  lumbar rotation on multiuse machine neutral, up, and down rotations 15# 5x each direction      Lumbar Exercises: Sidelying   Other Sidelying Lumbar Exercises  open book thoracic rotation x5 B 20 sec hold      Lumbar Exercises: Quadruped   Opposite Arm/Leg Raise  5 reps;3 seconds;Right arm/Left leg;Left arm/Right leg    Opposite Arm/Leg Raise  Limitations  cues to maintain neutral spine    Other Quadruped Lumbar Exercises  reach under in quadruped x5 reps each side    Other Quadruped Lumbar Exercises  child's pose fwd/lateral x20 sec each direction             PT Education - 07/02/19 0838    Education Details  Updated pt HEP to include rotation ex's and stretches    Person(s) Educated  Patient    Methods  Explanation;Demonstration;Handout    Comprehension  Verbalized understanding;Returned demonstration       PT Short Term Goals - 06/19/19 1601      PT SHORT TERM GOAL #1   Title  Pt will be I and compliant with initial HEP.    Status  Achieved        PT Long Term Goals - 06/15/19 1052      PT LONG TERM GOAL #1   Title  Pt will increase lumbar AROM to [facility] with no pain.    Baseline  deficits with report of L sided LBP    Time  8  Period  Weeks    Status  New    Target Date  08/10/19      PT LONG TERM GOAL #2   Title  Pt will report ability to walk dogs for at least 30 minutes at a time with no onset of BLE weakness.    Baseline  10 min    Time  8    Period  Weeks    Status  New    Target Date  08/10/19      PT LONG TERM GOAL #3   Title  Pt will be independent with advanced HEP + progressions for long term maintenance.    Time  8    Period  Weeks    Status  New    Target Date  08/10/19            Plan - 07/02/19 0839    Clinical Impression Statement  Progressed rotational lumbar strengthening/flexibility ex's; pt reports he wants to be ready for golf event. Added quadruped reach under thoracic rotation, birddog, child's pose, standing lumbar rotation, and prone quad stretch to patient HEP. Pt requested to end session early d/t fatigue after working in yard yesterday and tough PT session today.    PT Treatment/Interventions  Traction;Spinal Manipulations;Joint Manipulations;Dry needling;Passive range of motion;Manual techniques;Patient/family education;Neuromuscular re-education;Balance  training;Therapeutic exercise;Functional mobility training;Therapeutic activities;Moist Heat;Cryotherapy    PT Next Visit Plan  LE strenghtening and Lumbar flexibility. Manual as indicated. Possible DN in future rx.    Consulted and Agree with Plan of Care  Patient       Patient will benefit from skilled therapeutic intervention in order to improve the following deficits and impairments:  Hypomobility, Increased fascial restricitons, Impaired flexibility, Impaired perceived functional ability, Postural dysfunction, Decreased mobility, Decreased activity tolerance  Visit Diagnosis: Pain in left hip  Acute bilateral low back pain without sciatica  Difficulty in walking, not elsewhere classified  Pain in right hip     Problem List Patient Active Problem List   Diagnosis Date Noted  . Hip pain, bilateral 05/28/2019  . Chronic bilateral low back pain without sciatica 05/28/2019  . Diabetes mellitus type 2 in obese ([place]) 05/28/2019  . Erectile dysfunction 05/28/2019  . Pain and swelling of left knee 03/12/2019  . Lateral epicondylitis, right elbow 03/15/2017   Amador Cunas, PT, DPT Donald Prose Sarrah Fiorenza 07/02/2019, 8:43 AM  [address] [address] [place], [place], [address] Phone: [phone]   Fax:  [phone]  Name: Anfrenee Mcclarin MRN: [id number] Date of Birth: 1955-05-22

## 2019-07-04 ENCOUNTER — Ambulatory Visit: Payer: Medicare Other | Admitting: Physical Therapy

## 2019-07-04 ENCOUNTER — Other Ambulatory Visit: Payer: Self-pay

## 2019-07-04 ENCOUNTER — Encounter: Payer: Self-pay | Admitting: Physical Therapy

## 2019-07-04 DIAGNOSIS — M25552 Pain in left hip: Secondary | ICD-10-CM | POA: Diagnosis not present

## 2019-07-04 DIAGNOSIS — M545 Low back pain, unspecified: Secondary | ICD-10-CM

## 2019-07-04 DIAGNOSIS — M25551 Pain in right hip: Secondary | ICD-10-CM

## 2019-07-04 DIAGNOSIS — R262 Difficulty in walking, not elsewhere classified: Secondary | ICD-10-CM

## 2019-07-04 NOTE — Therapy (Signed)
[place] [address] [place], [place], [address] Phone: [phone]   Fax:  [phone]  Physical Therapy Treatment  Patient Details  Name: Noah Lane MRN: [id number] Date of Birth: [birth year] Referring Provider (PT): Riki Sheer, DO   Encounter Date: 07/04/2019  PT End of Session - 07/04/19 0826    Visit Number  7    Number of Visits  16    Date for PT Re-Evaluation  08/10/19    Authorization Type  Medicare    PT Start Time  0745    PT Stop Time  0816    PT Time Calculation (min)  31 min    Activity Tolerance  Patient tolerated treatment well    Behavior During Therapy  [facility] for tasks assessed/performed       Past Medical History:  Diagnosis Date  . Diabetes mellitus without complication [facility])     Past Surgical History:  Procedure Laterality Date  . HERNIA REPAIR  2012    There were no vitals filed for this visit.  Subjective Assessment - 07/04/19 0754    Subjective  Pt reports some aching in L piriformis and LLB the past couple of nights with mild radiating pain; states stretches into flexion help. Pt requests to begin early and leave early today so he can help drive a sight impaired individual to an emergency doctor's appointment.    Currently in Pain?  Yes    Pain Score  2     Pain Location  Back    Pain Orientation  Lower                       [facility] Adult PT Treatment/Exercise - 07/04/19 0001      Lumbar Exercises: Stretches   Standing Extension  5 reps;5 seconds    Standing Extension Limitations  standing lumbar extension over exercise ball    Quad Stretch  Right;Left;1 rep;30 seconds    Quad Stretch Limitations  prone    Other Lumbar Stretch Exercise  seated fwd/lateral flexion with exercise ball x3 each direction, 5 sec hold      Lumbar Exercises: Aerobic   Nustep  L5 x 5 min      Lumbar Exercises: Standing   Functional Squats  10 reps;3 seconds    Functional  Squats Limitations  on exercise ball      Manual Therapy   Manual Therapy  Soft tissue mobilization;Passive ROM    Soft tissue mobilization  STM to L lumbar paraspinals, glute/piriformis    Passive ROM  prone quad stretch B               PT Short Term Goals - 06/19/19 1601      PT SHORT TERM GOAL #1   Title  Pt will be I and compliant with initial HEP.    Status  Achieved        PT Long Term Goals - 06/15/19 1052      PT LONG TERM GOAL #1   Title  Pt will increase lumbar AROM to [facility] with no pain.    Baseline  deficits with report of L sided LBP    Time  8    Period  Weeks    Status  New    Target Date  08/10/19      PT LONG TERM GOAL #2   Title  Pt will report ability to walk dogs for at least 30 minutes  at a time with no onset of BLE weakness.    Baseline  10 min    Time  8    Period  Weeks    Status  New    Target Date  08/10/19      PT LONG TERM GOAL #3   Title  Pt will be independent with advanced HEP + progressions for long term maintenance.    Time  8    Period  Weeks    Status  New    Target Date  08/10/19            Plan - 07/04/19 0826    Clinical Impression Statement  Pt requested to begin and end session early d/t scheduling conflict; needed to take sight impaired individual to emergency doctors appt. Focused on lumbar flexibility and manual this rx d/t pt reports of stiffness, mild radiating in L glute, and soreness over the past few days. Continue to progress LE strengthening next rx.    PT Treatment/Interventions  Traction;Spinal Manipulations;Joint Manipulations;Dry needling;Passive range of motion;Manual techniques;Patient/family education;Neuromuscular re-education;Balance training;Therapeutic exercise;Functional mobility training;Therapeutic activities;Moist Heat;Cryotherapy    Consulted and Agree with Plan of Care  Patient       Patient will benefit from skilled therapeutic intervention in order to improve the following deficits and  impairments:  Hypomobility, Increased fascial restricitons, Impaired flexibility, Impaired perceived functional ability, Postural dysfunction, Decreased mobility, Decreased activity tolerance  Visit Diagnosis: Pain in left hip  Acute bilateral low back pain without sciatica  Difficulty in walking, not elsewhere classified  Pain in right hip     Problem List Patient Active Problem List   Diagnosis Date Noted  . Hip pain, bilateral 05/28/2019  . Chronic bilateral low back pain without sciatica 05/28/2019  . Diabetes mellitus type 2 in obese ([place]) 05/28/2019  . Erectile dysfunction 05/28/2019  . Pain and swelling of left knee 03/12/2019  . Lateral epicondylitis, right elbow 03/15/2017   Amador Cunas, PT, DPT Donald Prose Emelda Kohlbeck 07/04/2019, 8:29 AM  [address] [place], [place], [address] Phone: [phone]   Fax:  [phone]  Name: Michall Laverde MRN: [id number] Date of Birth: 09/28/1955

## 2019-07-09 ENCOUNTER — Ambulatory Visit: Payer: Medicare Other | Admitting: Physical Therapy

## 2019-07-10 ENCOUNTER — Ambulatory Visit: Payer: Medicare Other | Admitting: Physical Therapy

## 2019-07-10 ENCOUNTER — Other Ambulatory Visit: Payer: Self-pay

## 2019-07-10 DIAGNOSIS — M545 Low back pain, unspecified: Secondary | ICD-10-CM

## 2019-07-10 DIAGNOSIS — M25552 Pain in left hip: Secondary | ICD-10-CM

## 2019-07-10 DIAGNOSIS — R262 Difficulty in walking, not elsewhere classified: Secondary | ICD-10-CM

## 2019-07-10 DIAGNOSIS — M25551 Pain in right hip: Secondary | ICD-10-CM

## 2019-07-10 NOTE — Therapy (Signed)
[place], [place], [address] Phone: [phone]   Fax:  [phone]  Physical Therapy Treatment  Patient Details  Name: Noah Lane MRN: [id number] Date of Birth: 09/01/1955 Referring Provider (PT): Riki Sheer, [place]   Encounter Date: 07/10/2019  PT End of Session - 07/10/19 0834    Visit Number  8    Number of Visits  16    Date for PT Re-Evaluation  08/10/19    PT Start Time  [id number]    PT Stop Time  0835    PT Time Calculation (min)  40 min       Past Medical History:  Diagnosis Date  . Diabetes mellitus without complication [facility])     Past Surgical History:  Procedure Laterality Date  . HERNIA REPAIR  2012    There were no vitals filed for this visit.  Subjective Assessment - 07/10/19 0801    Subjective  "nagging" esp at night. walked 2 miles this morning    Currently in Pain?  Yes    Pain Score  2     Pain Location  Back    Pain Orientation  Lower                       [facility] Adult PT Treatment/Exercise - 07/10/19 0001      Lumbar Exercises: Aerobic   Nustep  L5 x 5 min   LE only     Lumbar Exercises: Machines for Strengthening   Cybex Lumbar Extension  black tband 2 sets 15 trunk flex and ext    Other Lumbar Machine Exercise  rows and lats 35lb 2x15      Lumbar Exercises: Standing   Other Standing Lumbar Exercises  wt ball OH ext and rotation    Other Standing Lumbar Exercises  pulley PNF and AR press      Manual Therapy   Manual Therapy  Passive ROM    Passive ROM  HS,piriformis, quad and hip rotators   left tighter throughout vs RT            PT Education - 07/10/19 0834    Education Details  trunk flex,ext and rotation with black tband    Person(s) Educated  Patient    Methods  Explanation;Demonstration    Comprehension  Verbalized understanding;Returned demonstration       PT Short Term Goals - 06/19/19 1601      PT SHORT  TERM GOAL #1   Title  Pt will be I and compliant with initial HEP.    Status  Achieved        PT Long Term Goals - 06/15/19 1052      PT LONG TERM GOAL #1   Title  Pt will increase lumbar AROM to [facility] with no pain.    Baseline  deficits with report of L sided LBP    Time  8    Period  Weeks    Status  New    Target Date  08/10/19      PT LONG TERM GOAL #2   Title  Pt will report ability to walk dogs for at least 30 minutes at a time with no onset of BLE weakness.    Baseline  10 min    Time  8    Period  Weeks    Status  New    Target Date  08/10/19      PT  LONG TERM GOAL #3   Title  Pt will be independent with advanced HEP + progressions for long term maintenance.    Time  8    Period  Weeks    Status  New    Target Date  08/10/19            Plan - 07/10/19 0835    Clinical Impression Statement  progressed trunk rotation and core work , issuing increased HEP. pt tolerated well and felt good after session. incrased tightness throughout on Left hip vs RT.    PT Treatment/Interventions  Traction;Spinal Manipulations;Joint Manipulations;Dry needling;Passive range of motion;Manual techniques;Patient/family education;Neuromuscular re-education;Balance training;Therapeutic exercise;Functional mobility training;Therapeutic activities;Moist Heat;Cryotherapy    PT Next Visit Plan  assess goals       Patient will benefit from skilled therapeutic intervention in order to improve the following deficits and impairments:  Hypomobility, Increased fascial restricitons, Impaired flexibility, Impaired perceived functional ability, Postural dysfunction, Decreased mobility, Decreased activity tolerance  Visit Diagnosis: Acute bilateral low back pain without sciatica  Pain in left hip  Difficulty in walking, not elsewhere classified  Pain in right hip     Problem List Patient Active Problem List   Diagnosis Date Noted  . Hip pain, bilateral 05/28/2019  . Chronic bilateral  low back pain without sciatica 05/28/2019  . Diabetes mellitus type 2 in obese ([place]) 05/28/2019  . Erectile dysfunction 05/28/2019  . Pain and swelling of left knee 03/12/2019  . Lateral epicondylitis, right elbow 03/15/2017    Timothy Trudell,ANGIE PTA 07/10/2019, 8:36 AM  [address] [place], [place], [address] Phone: [phone]   Fax:  [phone]  Name: Noah Lane MRN: [id number] Date of Birth: [birth year]

## 2019-07-11 ENCOUNTER — Ambulatory Visit: Payer: Medicare Other | Admitting: Physical Therapy

## 2019-07-12 ENCOUNTER — Other Ambulatory Visit: Payer: Self-pay

## 2019-07-12 ENCOUNTER — Ambulatory Visit: Payer: Medicare Other | Admitting: Physical Therapy

## 2019-07-12 DIAGNOSIS — R262 Difficulty in walking, not elsewhere classified: Secondary | ICD-10-CM

## 2019-07-12 DIAGNOSIS — M25552 Pain in left hip: Secondary | ICD-10-CM | POA: Diagnosis not present

## 2019-07-12 DIAGNOSIS — M545 Low back pain, unspecified: Secondary | ICD-10-CM

## 2019-07-12 DIAGNOSIS — M25551 Pain in right hip: Secondary | ICD-10-CM

## 2019-07-12 NOTE — Therapy (Signed)
[place], [place], [address] Phone: [phone]   Fax:  [phone]  Physical Therapy Treatment  Patient Details  Name: Noah Lane MRN: [id number] Date of Birth: [birth year] Referring Provider (PT): Riki Sheer, [place]   Encounter Date: 07/12/2019  PT End of Session - 07/12/19 0824    Visit Number  9    Number of Visits  16    Date for PT Re-Evaluation  08/10/19    PT Start Time  0277    PT Stop Time  0838    PT Time Calculation (min)  43 min       Past Medical History:  Diagnosis Date  . Diabetes mellitus without complication [facility])     Past Surgical History:  Procedure Laterality Date  . HERNIA REPAIR  2012    There were no vitals filed for this visit.  Subjective Assessment - 07/12/19 0755    Subjective  " surprised my back did not hurt me last night"    Currently in Pain?  Yes    Pain Score  2     Pain Location  Back    Pain Orientation  Lower                        [facility] Adult PT Treatment/Exercise - 07/12/19 0001      Lumbar Exercises: Aerobic   Nustep  L 5 39mn - LE only      Lumbar Exercises: Machines for Strengthening   Other Lumbar Machine Exercise  rows and lats 35lb 2x15      Lumbar Exercises: Standing   Other Standing Lumbar Exercises  upright row into trunk ext 2 sets 15 25#   AR press   Other Standing Lumbar Exercises  BIL hip 4 way 15 reps-10# cable pulleys   6# modified dead lift 2 sets 15     Manual Therapy   Manual Therapy  Passive ROM    Passive ROM  HS,piriformis, quad and hip rotators               PT Short Term Goals - 06/19/19 1601      PT SHORT TERM GOAL #1   Title  Pt will be I and compliant with initial HEP.    Status  Achieved        PT Long Term Goals - 07/12/19 04128     PT LONG TERM GOAL #1   Title  [facility]    Baseline  WFLS but minimal pain    Status   Partially Met      PT LONG TERM GOAL #2   Title  Pt will report ability to walk dogs for at least 30 minutes at a time with no onset of BLE weakness.    Status  Achieved      PT LONG TERM GOAL #3   Title  Pt will be independent with advanced HEP + progressions for long term maintenance.    Status  On-going            Plan - 07/12/19 0825    Clinical Impression Statement  added hip 4 way with cable pulley today- needed postural cuing, cues to engage core, very fatiguing. progressing with LTGs    PT Treatment/Interventions  Traction;Spinal Manipulations;Joint Manipulations;Dry needling;Passive range of motion;Manual techniques;Patient/family education;Neuromuscular re-education;Balance training;Therapeutic exercise;Functional mobility training;Therapeutic activities;Moist Heat;Cryotherapy  PT Next Visit Plan  pt to hit golf balls tomorrow- assess how he felt . conitnue with core activation       Patient will benefit from skilled therapeutic intervention in order to improve the following deficits and impairments:  Hypomobility, Increased fascial restricitons, Impaired flexibility, Impaired perceived functional ability, Postural dysfunction, Decreased mobility, Decreased activity tolerance  Visit Diagnosis: Acute bilateral low back pain without sciatica  Pain in left hip  Difficulty in walking, not elsewhere classified  Pain in right hip     Problem List Patient Active Problem List   Diagnosis Date Noted  . Hip pain, bilateral 05/28/2019  . Chronic bilateral low back pain without sciatica 05/28/2019  . Diabetes mellitus type 2 in obese ([place]) 05/28/2019  . Erectile dysfunction 05/28/2019  . Pain and swelling of left knee 03/12/2019  . Lateral epicondylitis, right elbow 03/15/2017    Markeda Narvaez,ANGIE PTA 07/12/2019, 8:42 AM  [address] [place], [place], [address] Phone: [phone]   Fax:   [phone]  Name: Noah Lane MRN: [id number] Date of Birth: August 22, 1955

## 2019-07-16 ENCOUNTER — Ambulatory Visit: Payer: Medicare Other | Admitting: Physical Therapy

## 2019-07-16 ENCOUNTER — Other Ambulatory Visit: Payer: Self-pay

## 2019-07-16 ENCOUNTER — Encounter: Payer: Self-pay | Admitting: Physical Therapy

## 2019-07-16 DIAGNOSIS — M545 Low back pain, unspecified: Secondary | ICD-10-CM

## 2019-07-16 DIAGNOSIS — M25551 Pain in right hip: Secondary | ICD-10-CM

## 2019-07-16 DIAGNOSIS — M25552 Pain in left hip: Secondary | ICD-10-CM

## 2019-07-16 DIAGNOSIS — R262 Difficulty in walking, not elsewhere classified: Secondary | ICD-10-CM

## 2019-07-16 NOTE — Therapy (Signed)
[address] [place], [place], [address] Phone: [phone]   Fax:  [phone] Progress Note Reporting Period 06/15/19 to 07/16/19 for the first 10 visits   See note below for Objective Data and Assessment of Progress/Goals.      Physical Therapy Treatment  Patient Details  Name: Noah Lane MRN: [id number] Date of Birth: January 07, 1956 Referring Provider (PT): Riki Sheer, DO   Encounter Date: 07/16/2019  PT End of Session - 07/16/19 0927    Visit Number  10    Date for PT Re-Evaluation  08/10/19    Authorization Type  Medicare    PT Start Time  0845    PT Stop Time  0927    PT Time Calculation (min)  42 min    Activity Tolerance  Patient tolerated treatment well    Behavior During Therapy  [facility] for tasks assessed/performed       Past Medical History:  Diagnosis Date  . Diabetes mellitus without complication [facility])     Past Surgical History:  Procedure Laterality Date  . HERNIA REPAIR  2012    There were no vitals filed for this visit.  Subjective Assessment - 07/16/19 0848    Subjective  "Pretty beat" "Quads has been cramping since last session"    Pertinent History  L knee cellulitis, T2 DM    Currently in Pain?  No/denies                        [facility] Adult PT Treatment/Exercise - 07/16/19 0001      Lumbar Exercises: Aerobic   Nustep  L 5 15mn       Lumbar Exercises: Machines for Strengthening   Cybex Lumbar Extension  black tband 2 sets 15 trunk ext    Other Lumbar Machine Exercise  rows and lats 35lb 2x15      Lumbar Exercises: Standing   Functional Squats  10 reps;3 seconds   x2   Functional Squats Limitations  W/ [facility] yellow ball     Other Standing Lumbar Exercises  wt ball OH ext     Other Standing Lumbar Exercises  AR press 25 x10 each       Manual Therapy   Manual Therapy  Passive ROM    Passive ROM  HS,piriformis, quad and hip rotators                PT Short Term Goals - 06/19/19 1601      PT SHORT TERM GOAL #1   Title  Pt will be I and compliant with initial HEP.    Status  Achieved        PT Long Term Goals - 07/16/19 0929      PT LONG TERM GOAL #1   Title  [facility].    Status  Partially Met      PT LONG TERM GOAL #2   Title  Pt will report ability to walk dogs for at least 30 minutes at a time with no onset of BLE weakness.    Status  Achieved      PT LONG TERM GOAL #3   Title  Pt will be independent with advanced HEP + progressions for long term maintenance.    Status  On-going            Plan - 07/16/19 0928    Clinical Impression Statement  Increase soreness in quads from last session so some interventions were omitted. Core weakness present with anti-rotational presses. Postural cues required with seated rows and lats,    Personal Factors and Comorbidities  Age;Comorbidity 1;Past/Current Experience    Examination-Activity Limitations  Locomotion Level;Sleep;Stand    Examination-Participation Restrictions  Community Activity    Stability/Clinical Decision Making  Stable/Uncomplicated    Rehab Potential  Good    PT Frequency  2x / week    PT Duration  8 weeks    PT Treatment/Interventions  Traction;Spinal Manipulations;Joint Manipulations;Dry needling;Passive range of motion;Manual techniques;Patient/family education;Neuromuscular re-education;Balance training;Therapeutic exercise;Functional mobility training;Therapeutic activities;Moist Heat;Cryotherapy    PT Next Visit Plan  conitnue with core activation       Patient will benefit from skilled therapeutic intervention in order to improve the following deficits and impairments:  Hypomobility, Increased fascial restricitons, Impaired flexibility, Impaired perceived functional ability, Postural dysfunction, Decreased mobility, Decreased activity tolerance  Visit Diagnosis: Pain in left  hip  Difficulty in walking, not elsewhere classified  Pain in right hip  Acute bilateral low back pain without sciatica     Problem List Patient Active Problem List   Diagnosis Date Noted  . Hip pain, bilateral 05/28/2019  . Chronic bilateral low back pain without sciatica 05/28/2019  . Diabetes mellitus type 2 in obese ([place]) 05/28/2019  . Erectile dysfunction 05/28/2019  . Pain and swelling of left knee 03/12/2019  . Lateral epicondylitis, right elbow 03/15/2017    Scot Jun 07/16/2019, 9:30 AM  [address] [place], [place], [address] Phone: [phone]   Fax:  [phone]  Name: Noah Lane MRN: [id number] Date of Birth: [birth year]

## 2019-07-18 ENCOUNTER — Encounter: Payer: Medicare Other | Admitting: Physical Therapy

## 2019-07-19 ENCOUNTER — Encounter: Payer: Self-pay | Admitting: Physical Therapy

## 2019-07-19 ENCOUNTER — Ambulatory Visit: Payer: Medicare Other | Admitting: Physical Therapy

## 2019-07-19 ENCOUNTER — Other Ambulatory Visit: Payer: Self-pay

## 2019-07-19 DIAGNOSIS — R262 Difficulty in walking, not elsewhere classified: Secondary | ICD-10-CM

## 2019-07-19 DIAGNOSIS — M25551 Pain in right hip: Secondary | ICD-10-CM

## 2019-07-19 DIAGNOSIS — M25552 Pain in left hip: Secondary | ICD-10-CM

## 2019-07-19 DIAGNOSIS — M545 Low back pain, unspecified: Secondary | ICD-10-CM

## 2019-07-19 NOTE — Therapy (Signed)
[place] [address] [place] [place], [place], [address] Phone: [phone]   Fax:  [phone]  Physical Therapy Treatment  Patient Details  Name: Noah Lane MRN: [id number] Date of Birth: 08-05-1955 Referring Provider (PT): Riki Sheer, DO   Encounter Date: 07/19/2019  PT End of Session - 07/19/19 1446    Visit Number  11    Number of Visits  16    Date for PT Re-Evaluation  08/10/19    Authorization Type  Medicare    PT Start Time  6945    PT Stop Time  1445    PT Time Calculation (min)  47 min    Activity Tolerance  Patient tolerated treatment well    Behavior During Therapy  [facility] for tasks assessed/performed       Past Medical History:  Diagnosis Date  . Diabetes mellitus without complication [facility])     Past Surgical History:  Procedure Laterality Date  . HERNIA REPAIR  2012    There were no vitals filed for this visit.  Subjective Assessment - 07/19/19 1407    Subjective  Pt reports quads are still sore from "overdoing it" two sessions ago    Currently in Pain?  Yes    Pain Score  0-No pain    Pain Location  Back                        [facility] Adult PT Treatment/Exercise - 07/19/19 0001      Lumbar Exercises: Stretches   Quad Stretch  Right;Left;1 rep;60 seconds    Quad Stretch Limitations  prone    Other Lumbar Stretch Exercise  sktc with rotation to opp side x2 B w/ 20 sec hold      Lumbar Exercises: Aerobic   Elliptical  I10 R4 x4 min fwd/x3 min bkwd      Lumbar Exercises: Machines for Strengthening   Cybex Lumbar Extension  black tband 2 sets 15 trunk ext    Other Lumbar Machine Exercise  lumbar rotation and chest press     Other Lumbar Machine Exercise  rows and lats 35lb 2x15      Lumbar Exercises: Standing   Other Standing Lumbar Exercises  mini squats with exercise ball, 5 sec hold x10    Other Standing Lumbar Exercises  hanging stretch on pullup bar                PT Short Term Goals - 06/19/19 1601      PT SHORT TERM GOAL #1   Title  Pt will be I and compliant with initial HEP.    Status  Achieved        PT Long Term Goals - 07/16/19 0929      PT LONG TERM GOAL #1   Title  Pt will increase lumbar AROM to [facility] with no pain.    Status  Partially Met      PT LONG TERM GOAL #2   Title  Pt will report ability to walk dogs for at least 30 minutes at a time with no onset of BLE weakness.    Status  Achieved      PT LONG TERM GOAL #3   Title  Pt will be independent with advanced HEP + progressions for long term maintenance.    Status  On-going            Plan - 07/19/19 1446    Clinical  Impression Statement  Pt required postural cues for standing ex's and rotational ex's. Pt demonstrates core weakness with lumbar stab ex's. Continue to progress next rx.    PT Treatment/Interventions  Traction;Spinal Manipulations;Joint Manipulations;Dry needling;Passive range of motion;Manual techniques;Patient/family education;Neuromuscular re-education;Balance training;Therapeutic exercise;Functional mobility training;Therapeutic activities;Moist Heat;Cryotherapy    PT Next Visit Plan  conitnue with core activation    Consulted and Agree with Plan of Care  Patient       Patient will benefit from skilled therapeutic intervention in order to improve the following deficits and impairments:  Hypomobility, Increased fascial restricitons, Impaired flexibility, Impaired perceived functional ability, Postural dysfunction, Decreased mobility, Decreased activity tolerance  Visit Diagnosis: Pain in left hip  Difficulty in walking, not elsewhere classified  Pain in right hip  Acute bilateral low back pain without sciatica     Problem List Patient Active Problem List   Diagnosis Date Noted  . Hip pain, bilateral 05/28/2019  . Chronic bilateral low back pain without sciatica 05/28/2019  . Diabetes mellitus type 2 in obese ([place])  05/28/2019  . Erectile dysfunction 05/28/2019  . Pain and swelling of left knee 03/12/2019  . Lateral epicondylitis, right elbow 03/15/2017   Amador Cunas, PT, DPT Donald Prose Mirta Mally 07/19/2019, 2:50 PM  [place] [address] [place], [place], [address] Phone: [phone]   Fax:  [phone]  Name: Noah Lane MRN: [id number] Date of Birth: 02-18-1956

## 2019-07-23 ENCOUNTER — Other Ambulatory Visit: Payer: Self-pay

## 2019-07-23 ENCOUNTER — Encounter: Payer: Self-pay | Admitting: Physical Therapy

## 2019-07-23 ENCOUNTER — Ambulatory Visit: Payer: Medicare Other | Admitting: Physical Therapy

## 2019-07-23 DIAGNOSIS — M25552 Pain in left hip: Secondary | ICD-10-CM | POA: Diagnosis not present

## 2019-07-23 DIAGNOSIS — R262 Difficulty in walking, not elsewhere classified: Secondary | ICD-10-CM

## 2019-07-23 DIAGNOSIS — M25551 Pain in right hip: Secondary | ICD-10-CM

## 2019-07-23 DIAGNOSIS — M545 Low back pain, unspecified: Secondary | ICD-10-CM

## 2019-07-23 NOTE — Therapy (Signed)
[place], [place], [address] Phone: [phone]   Fax:  [phone]  Physical Therapy Treatment  Patient Details  Name: Noah Lane MRN: [id number] Date of Birth: 01-08-1956 Referring Provider (PT): Riki Sheer, DO   Encounter Date: 07/23/2019  PT End of Session - 07/23/19 0928    Visit Number  12    Date for PT Re-Evaluation  08/10/19    Authorization Type  Medicare    PT Start Time  [phone]    PT Stop Time  0930    PT Time Calculation (min)  48 min    Activity Tolerance  Patient tolerated treatment well    Behavior During Therapy  [facility] for tasks assessed/performed       Past Medical History:  Diagnosis Date  . Diabetes mellitus without complication [facility])     Past Surgical History:  Procedure Laterality Date  . HERNIA REPAIR  2012    There were no vitals filed for this visit.  Subjective Assessment - 07/23/19 0843    Subjective  "Lots of places are sore and hurting, but that is a part of it though"    Currently in Pain?  Yes    Pain Score  4     Pain Location  --   Groin, quads and gluted   Pain Descriptors / Indicators  Nagging                        [facility] Adult PT Treatment/Exercise - 07/23/19 0001      Lumbar Exercises: Aerobic   Elliptical  I10 R5 x2 min fwd/x2 min bkwd    Nustep  L 5 5 min       Lumbar Exercises: Machines for Strengthening   Cybex Lumbar Extension  black tband 2 sets 15 trunk ext    Other Lumbar Machine Exercise  rows and lats 45lb 2x10      Lumbar Exercises: Standing   Functional Squats  10 reps;3 seconds   x2   Functional Squats Limitations  W/ OHP yellow ball     Other Standing Lumbar Exercises  wt ball OH ext; Hanging stretch on pull up bar     Other Standing Lumbar Exercises  AR press 25 x10 each, Streight arm pull dowsn 25lb x 10, 35lb x 10       Manual Therapy   Manual Therapy  Passive ROM    Passive ROM   HS,piriformis, quad and hip rotators               PT Short Term Goals - 06/19/19 1601      PT SHORT TERM GOAL #1   Title  Pt will be I and compliant with initial HEP.    Status  Achieved        PT Long Term Goals - 07/23/19 0847      PT LONG TERM GOAL #2   Title  Pt will report ability to walk dogs for at least 30 minutes at a time with no onset of BLE weakness.    Status  Achieved            Plan - 07/23/19 0929    Clinical Impression Statement  Postural cues required with seated rows and lat pull downs. Core weakness present with anti- rotational presses. Bilateral HS tightness noted. Cues to low back to emphases lumbar ext with overhead extensions.    Personal Factors and Comorbidities  Age;Comorbidity 1;Past/Current Experience    Comorbidities  DM T2    Examination-Activity Limitations  Locomotion Level;Sleep;Stand    Examination-Participation Restrictions  Community Activity    Stability/Clinical Decision Making  Stable/Uncomplicated    Rehab Potential  Good    PT Frequency  2x / week    PT Duration  8 weeks    PT Treatment/Interventions  Traction;Spinal Manipulations;Joint Manipulations;Dry needling;Passive range of motion;Manual techniques;Patient/family education;Neuromuscular re-education;Balance training;Therapeutic exercise;Functional mobility training;Therapeutic activities;Moist Heat;Cryotherapy    PT Next Visit Plan  conitnue with core activation       Patient will benefit from skilled therapeutic intervention in order to improve the following deficits and impairments:  Hypomobility, Increased fascial restricitons, Impaired flexibility, Impaired perceived functional ability, Postural dysfunction, Decreased mobility, Decreased activity tolerance  Visit Diagnosis: Pain in left hip  Pain in right hip  Difficulty in walking, not elsewhere classified  Acute bilateral low back pain without sciatica     Problem List Patient Active Problem List    Diagnosis Date Noted  . Hip pain, bilateral 05/28/2019  . Chronic bilateral low back pain without sciatica 05/28/2019  . Diabetes mellitus type 2 in obese ([place]) 05/28/2019  . Erectile dysfunction 05/28/2019  . Pain and swelling of left knee 03/12/2019  . Lateral epicondylitis, right elbow 03/15/2017    Scot Jun, PTA 07/23/2019, 9:32 AM  [place], [place], [address] Phone: [phone]   Fax:  [phone]  Name: Abir Camden MRN: [id number] Date of Birth: 04-09-1955

## 2019-07-25 ENCOUNTER — Other Ambulatory Visit: Payer: Self-pay

## 2019-07-25 ENCOUNTER — Ambulatory Visit: Payer: Medicare Other | Admitting: Physical Therapy

## 2019-07-25 ENCOUNTER — Encounter: Payer: Self-pay | Admitting: Physical Therapy

## 2019-07-25 DIAGNOSIS — R262 Difficulty in walking, not elsewhere classified: Secondary | ICD-10-CM

## 2019-07-25 DIAGNOSIS — M25552 Pain in left hip: Secondary | ICD-10-CM | POA: Diagnosis not present

## 2019-07-25 DIAGNOSIS — M25551 Pain in right hip: Secondary | ICD-10-CM

## 2019-07-25 DIAGNOSIS — M545 Low back pain, unspecified: Secondary | ICD-10-CM

## 2019-07-25 NOTE — Therapy (Signed)
[place], [place], [address] Phone: [phone]   Fax:  [phone]  Physical Therapy Treatment  Patient Details  Name: Noah Lane MRN: [id number] Date of Birth: 1955/03/27 Referring Provider (PT): Riki Sheer, DO   Encounter Date: 07/25/2019  PT End of Session - 07/25/19 0929    Visit Number  13    Date for PT Re-Evaluation  08/10/19    Authorization Type  Medicare    PT Start Time  0846    PT Stop Time  0929    PT Time Calculation (min)  43 min    Activity Tolerance  Patient tolerated treatment well    Behavior During Therapy  [facility] for tasks assessed/performed       Past Medical History:  Diagnosis Date  . Diabetes mellitus without complication [facility])     Past Surgical History:  Procedure Laterality Date  . HERNIA REPAIR  2012    There were no vitals filed for this visit.  Subjective Assessment - 07/25/19 0848    Subjective  "I feel good" "I actually feel a little firmer"    Currently in Pain?  No/denies    Pain Location  Groin    Pain Descriptors / Indicators  Nagging                        [facility] Adult PT Treatment/Exercise - 07/25/19 0001      Lumbar Exercises: Stretches   Active Hamstring Stretch  Right;Left;3 reps;10 seconds;20 seconds    Other Lumbar Stretch Exercise  Active Adductor stretch 2x15''      Lumbar Exercises: Aerobic   Elliptical  I10 R5 x2 min fwd/ 2 min bkwd      Lumbar Exercises: Machines for Strengthening   Cybex Lumbar Extension  black tband 2 sets 15 trunk ext    Other Lumbar Machine Exercise  rows and lats 45lb 2x10      Lumbar Exercises: Standing   Functional Squats  10 reps;3 seconds    Functional Squats Limitations  W/ OHP yellow ball     Shoulder Extension  Theraband;20 reps;Strengthening;Both    Shoulder Extension Limitations  5    Other Standing Lumbar Exercises  wt ball OH ext; cross body with ball       Lumbar  Exercises: Supine   Other Supine Lumbar Exercises  LE on Pball bridges, K2C, Oblq                PT Short Term Goals - 06/19/19 1601      PT SHORT TERM GOAL #1   Title  Pt will be I and compliant with initial HEP.    Status  Achieved        PT Long Term Goals - 07/23/19 0847      PT LONG TERM GOAL #2   Title  Pt will report ability to walk dogs for at least 30 minutes at a time with no onset of BLE weakness.    Status  Achieved            Plan - 07/25/19 0930    Clinical Impression Statement  Pt enters clinic reporting some improvement. He did well with the activities today, no reports of increase pain. Postural cues needed with standing shoulder extension not to flex spine. Good motion today with overhead Extensions. Some quad fatigue with squats.    Personal Factors and Comorbidities  Age;Comorbidity 1;Past/Current Experience  Comorbidities  DM T2    Examination-Activity Limitations  Locomotion Level;Sleep;Stand    Examination-Participation Restrictions  Community Activity    Stability/Clinical Decision Making  Stable/Uncomplicated    Rehab Potential  Good    PT Frequency  2x / week    PT Duration  8 weeks    PT Treatment/Interventions  Traction;Spinal Manipulations;Joint Manipulations;Dry needling;Passive range of motion;Manual techniques;Patient/family education;Neuromuscular re-education;Balance training;Therapeutic exercise;Functional mobility training;Therapeutic activities;Moist Heat;Cryotherapy    PT Next Visit Plan  conitnue with core activation       Patient will benefit from skilled therapeutic intervention in order to improve the following deficits and impairments:  Hypomobility, Increased fascial restricitons, Impaired flexibility, Impaired perceived functional ability, Postural dysfunction, Decreased mobility, Decreased activity tolerance  Visit Diagnosis: Pain in right hip  Acute bilateral low back pain without sciatica  Difficulty in  walking, not elsewhere classified  Pain in left hip     Problem List Patient Active Problem List   Diagnosis Date Noted  . Hip pain, bilateral 05/28/2019  . Chronic bilateral low back pain without sciatica 05/28/2019  . Diabetes mellitus type 2 in obese ([place]) 05/28/2019  . Erectile dysfunction 05/28/2019  . Pain and swelling of left knee 03/12/2019  . Lateral epicondylitis, right elbow 03/15/2017    Scot Jun, PTA 07/25/2019, 9:39 AM  [place], [place], [address] Phone: [phone]   Fax:  [phone]  Name: Noah Lane MRN: [id number] Date of Birth: 1955/09/28

## 2019-08-01 ENCOUNTER — Encounter: Payer: Medicare Other | Admitting: Physical Therapy

## 2019-08-03 ENCOUNTER — Ambulatory Visit: Payer: Medicare Other | Attending: Family Medicine | Admitting: Physical Therapy

## 2019-08-03 ENCOUNTER — Encounter: Payer: Self-pay | Admitting: Physical Therapy

## 2019-08-03 ENCOUNTER — Other Ambulatory Visit: Payer: Self-pay

## 2019-08-03 DIAGNOSIS — M545 Low back pain, unspecified: Secondary | ICD-10-CM

## 2019-08-03 DIAGNOSIS — R262 Difficulty in walking, not elsewhere classified: Secondary | ICD-10-CM | POA: Diagnosis present

## 2019-08-03 DIAGNOSIS — M25552 Pain in left hip: Secondary | ICD-10-CM | POA: Insufficient documentation

## 2019-08-03 DIAGNOSIS — M25551 Pain in right hip: Secondary | ICD-10-CM

## 2019-08-03 NOTE — Patient Instructions (Signed)
Access Code: [email or web address] URL: [email or web address] Date: 08/03/2019 Prepared by: Amador Cunas  Exercises Supine Piriformis Stretch with Leg Straight - 1 x daily - 7 x weekly - 10 reps - 3 sets Supine Lower Trunk Rotation - 1 x daily - 7 x weekly - 10 reps - 3 sets Seated Table Hamstring Stretch - 1 x daily - 7 x weekly - 10 reps - 3 sets Prone Quadriceps Stretch with Strap - 1 x daily - 7 x weekly - 10 reps - 3 sets Child's Pose Stretch - 1 x daily - 7 x weekly - 10 reps - 3 sets Child's Pose with Sidebending - 1 x daily - 7 x weekly - 10 reps - 3 sets Cat-Camel - 1 x daily - 7 x weekly - 10 reps - 3 sets

## 2019-08-03 NOTE — Therapy (Signed)
[place], [place], [address] Phone: [phone]   Fax:  [phone]  Physical Therapy Treatment  Patient Details  Name: Sumit Branham MRN: [id number] Date of Birth: 06/08/1955 Referring Provider (PT): Riki Sheer, DO   Encounter Date: 08/03/2019  PT End of Session - 08/03/19 0852    Visit Number  14    Date for PT Re-Evaluation  08/10/19    PT Start Time  9417    PT Stop Time  0842    PT Time Calculation (min)  47 min    Activity Tolerance  Patient tolerated treatment well    Behavior During Therapy  [facility] for tasks assessed/performed       Past Medical History:  Diagnosis Date  . Diabetes mellitus without complication [facility])     Past Surgical History:  Procedure Laterality Date  . HERNIA REPAIR  2012    There were no vitals filed for this visit.  Subjective Assessment - 08/03/19 0812    Subjective  Pt reports playing golf with no increase in pain afterward; still reporting stiffness/soreness in quad and LB after walking and at night    Currently in Pain?  No/denies    Pain Score  0-No pain                        [facility] Adult PT Treatment/Exercise - 08/03/19 0001      Lumbar Exercises: Aerobic   Elliptical  I10 R5 x4 min fwd/58min bkwd    Other Aerobic Exercise  golf swing x5 B      Lumbar Exercises: Machines for Strengthening   Cybex Lumbar Extension  black tband 2 sets 15 trunk ext    Cybex Knee Extension  25# 2x15    Cybex Knee Flexion  35# 2x15    Leg Press  60# 2x15    Other Lumbar Machine Exercise  shoulder ext 10# 2x15, AR 25# 1x15 B    Other Lumbar Machine Exercise  rows and lats 45lb 2x10             PT Education - 08/03/19 0852    Education Details  Pt instructed in HEP for stretching routine    Person(s) Educated  Patient    Methods  Explanation;Demonstration;Handout    Comprehension  Verbalized understanding;Returned demonstration        PT Short Term Goals - 06/19/19 1601      PT SHORT TERM GOAL #1   Title  Pt will be I and compliant with initial HEP.    Status  Achieved        PT Long Term Goals - 07/23/19 0847      PT LONG TERM GOAL #2   Title  Pt will report ability to walk dogs for at least 30 minutes at a time with no onset of BLE weakness.    Status  Achieved            Plan - 08/03/19 4081    Clinical Impression Statement  Pt tolerated progression of TE well with no complaints of increased pain. Prescribed stretching routine as HEP at request of pt to address lumbar flexibility and ROM. Plan for pt d/c at last scheduled visit next week.    PT Treatment/Interventions  Traction;Spinal Manipulations;Joint Manipulations;Dry needling;Passive range of motion;Manual techniques;Patient/family education;Neuromuscular re-education;Balance training;Therapeutic exercise;Functional mobility training;Therapeutic activities;Moist Heat;Cryotherapy    PT Next Visit Plan  conitnue with core activation, plan for  d/c at last scheduled rx    Consulted and Agree with Plan of Care  Patient       Patient will benefit from skilled therapeutic intervention in order to improve the following deficits and impairments:  Hypomobility, Increased fascial restricitons, Impaired flexibility, Impaired perceived functional ability, Postural dysfunction, Decreased mobility, Decreased activity tolerance  Visit Diagnosis: Pain in right hip  Acute bilateral low back pain without sciatica  Difficulty in walking, not elsewhere classified  Pain in left hip     Problem List Patient Active Problem List   Diagnosis Date Noted  . Hip pain, bilateral 05/28/2019  . Chronic bilateral low back pain without sciatica 05/28/2019  . Diabetes mellitus type 2 in obese ([place]) 05/28/2019  . Erectile dysfunction 05/28/2019  . Pain and swelling of left knee 03/12/2019  . Lateral epicondylitis, right elbow 03/15/2017   Amador Cunas, PT,  DPT Donald Prose Sarh Kirschenbaum 08/03/2019, 8:55 AM  [address] [address] [address] [place], [place], [address] Phone: [phone]   Fax:  [phone]  Name: Dierks Wach MRN: [id number] Date of Birth: [birth year]

## 2019-08-04 ENCOUNTER — Encounter (HOSPITAL_BASED_OUTPATIENT_CLINIC_OR_DEPARTMENT_OTHER): Payer: Self-pay

## 2019-08-04 ENCOUNTER — Other Ambulatory Visit: Payer: Self-pay

## 2019-08-04 ENCOUNTER — Emergency Department (HOSPITAL_BASED_OUTPATIENT_CLINIC_OR_DEPARTMENT_OTHER)
Admission: EM | Admit: 2019-08-04 | Discharge: 2019-08-05 | Disposition: A | Discharge status: Home / Self Care | Payer: Medicare Other | Attending: Emergency Medicine | Admitting: Emergency Medicine

## 2019-08-04 DIAGNOSIS — T63441A Toxic effect of venom of bees, accidental (unintentional), initial encounter: Secondary | ICD-10-CM | POA: Insufficient documentation

## 2019-08-04 DIAGNOSIS — E119 Type 2 diabetes mellitus without complications: Secondary | ICD-10-CM | POA: Diagnosis not present

## 2019-08-04 DIAGNOSIS — Z79899 Other long term (current) drug therapy: Secondary | ICD-10-CM | POA: Diagnosis not present

## 2019-08-04 DIAGNOSIS — Z87891 Personal history of nicotine dependence: Secondary | ICD-10-CM | POA: Diagnosis not present

## 2019-08-04 DIAGNOSIS — T63443A Toxic effect of venom of bees, assault, initial encounter: Secondary | ICD-10-CM

## 2019-08-04 DIAGNOSIS — R2231 Localized swelling, mass and lump, right upper limb: Secondary | ICD-10-CM | POA: Diagnosis present

## 2019-08-04 MED ORDER — IBUPROFEN 400 MG PO TABS
400.0000 mg | ORAL_TABLET | Freq: Once | ORAL | Status: AC | PRN
  Administered 2019-08-04: 400 mg via ORAL
  Filled 2019-08-04: qty 1

## 2019-08-04 MED ORDER — KETOROLAC TROMETHAMINE 30 MG/ML IJ SOLN
30.0000 mg | Freq: Once | INTRAMUSCULAR | Status: AC
  Administered 2019-08-04: 30 mg via INTRAMUSCULAR
  Filled 2019-08-04: qty 1

## 2019-08-04 NOTE — Discharge Instructions (Addendum)
Continue to ice and elevate the hand for the next 2 days.  Take Benadryl every 6 hours as needed for any itching or swelling.  If you develop any streaking redness, significant fever, drainage or fever return to the ER.
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
## 2019-08-04 NOTE — ED Triage Notes (Addendum)
Pt states he was stung by a bee on the R pinky at 1800. Pt has swelling that has progressed to the hand. Denies ShOB. Lung sounds clear. Pt took 50mg  of benadryl at 1900.

## 2019-08-06 ENCOUNTER — Encounter: Payer: Self-pay | Admitting: Physical Therapy

## 2019-08-06 ENCOUNTER — Ambulatory Visit: Payer: Medicare Other | Admitting: Physical Therapy

## 2019-08-06 ENCOUNTER — Other Ambulatory Visit: Payer: Self-pay

## 2019-08-06 DIAGNOSIS — M545 Low back pain, unspecified: Secondary | ICD-10-CM

## 2019-08-06 DIAGNOSIS — M25551 Pain in right hip: Secondary | ICD-10-CM

## 2019-08-06 DIAGNOSIS — M25552 Pain in left hip: Secondary | ICD-10-CM

## 2019-08-06 DIAGNOSIS — R262 Difficulty in walking, not elsewhere classified: Secondary | ICD-10-CM

## 2019-08-06 NOTE — Therapy (Signed)
[place], [place], [address] Phone: [phone]   Fax:  [phone]  Physical Therapy Treatment  Patient Details  Name: Noah Lane MRN: [id number] Date of Birth: 11/15/1955 Referring Provider (PT): Riki Sheer, DO   Encounter Date: 08/06/2019  PT End of Session - 08/06/19 0837    Date for PT Re-Evaluation  08/10/19    Authorization Type  Medicare    PT Start Time  0800    PT Stop Time  0835    PT Time Calculation (min)  35 min    Activity Tolerance  Patient tolerated treatment well    Behavior During Therapy  [facility] for tasks assessed/performed       Past Medical History:  Diagnosis Date  . Diabetes mellitus without complication [facility])     Past Surgical History:  Procedure Laterality Date  . HERNIA REPAIR  2012    There were no vitals filed for this visit.  Subjective Assessment - 08/06/19 0802    Subjective  Pt enters clinic with a R swollen hand and from bee sting yesterday. Back is not bad, has not done a lot of walking in a few days    Currently in Pain?  Yes    Pain Score  5     Pain Location  Back    Pain Orientation  Left    Pain Descriptors / Indicators  Nagging                        [facility] Adult PT Treatment/Exercise - 08/06/19 0001      Lumbar Exercises: Aerobic   Nustep  L4 x 7 min       Lumbar Exercises: Machines for Strengthening   Cybex Knee Extension  25# 2x15    Cybex Knee Flexion  35# 2x15    Leg Press  60# 2x15      Lumbar Exercises: Seated   Sit to Stand  10 reps   x2   Other Seated Lumbar Exercises  ab sets with ball 2x10 3 sec hold               PT Short Term Goals - 06/19/19 1601      PT SHORT TERM GOAL #1   Title  Pt will be I and compliant with initial HEP.    Status  Achieved        PT Long Term Goals - 07/23/19 0847      PT LONG TERM GOAL #2   Title  Pt will report ability to walk dogs for at least 30  minutes at a time with no onset of BLE weakness.    Status  Achieved            Plan - 08/06/19 0837    Clinical Impression Statement  Pt did request a shorten treatment time. interventions limited due to R hand swelling. Cue needed to complete full ROM with leg extension. Cue needed to maintain core engagement with supine interventions. Gave pt description of the supine pball interventions that were completed today so he could do them at home.    Personal Factors and Comorbidities  Age;Comorbidity 1;Past/Current Experience    Examination-Activity Limitations  Locomotion Level;Sleep;Stand    Examination-Participation Restrictions  Community Activity    Stability/Clinical Decision Making  Stable/Uncomplicated    Rehab Potential  Good    PT Frequency  2x / week    PT Duration  8 weeks    PT Treatment/Interventions  Traction;Spinal Manipulations;Joint Manipulations;Dry needling;Passive range of motion;Manual techniques;Patient/family education;Neuromuscular re-education;Balance training;Therapeutic exercise;Functional mobility training;Therapeutic activities;Moist Heat;Cryotherapy    PT Next Visit Plan  conitnue with core activation, plan for d/c at last scheduled rx       Patient will benefit from skilled therapeutic intervention in order to improve the following deficits and impairments:  Hypomobility, Increased fascial restricitons, Impaired flexibility, Impaired perceived functional ability, Postural dysfunction, Decreased mobility, Decreased activity tolerance  Visit Diagnosis: Difficulty in walking, not elsewhere classified  Acute bilateral low back pain without sciatica  Pain in right hip  Pain in left hip     Problem List Patient Active Problem List   Diagnosis Date Noted  . Hip pain, bilateral 05/28/2019  . Chronic bilateral low back pain without sciatica 05/28/2019  . Diabetes mellitus type 2 in obese ([place]) 05/28/2019  . Erectile dysfunction 05/28/2019  . Pain and  swelling of left knee 03/12/2019  . Lateral epicondylitis, right elbow 03/15/2017    Scot Jun, PTA 08/06/2019, 8:39 AM  [address] [place], [place], [address] Phone: [phone]   Fax:  [phone]  Name: Vedant Shehadeh MRN: [id number] Date of Birth: 1956-02-09

## 2019-08-07 ENCOUNTER — Encounter: Payer: Self-pay | Admitting: Family Medicine

## 2019-08-07 ENCOUNTER — Telehealth: Payer: Self-pay

## 2019-08-07 ENCOUNTER — Ambulatory Visit (INDEPENDENT_AMBULATORY_CARE_PROVIDER_SITE_OTHER): Payer: Medicare Other | Admitting: Family Medicine

## 2019-08-07 VITALS — BP 148/80 | HR 67 | Temp 97.0°F | Resp 18 | Ht 73.0 in | Wt 258.2 lb

## 2019-08-07 DIAGNOSIS — T63441A Toxic effect of venom of bees, accidental (unintentional), initial encounter: Secondary | ICD-10-CM | POA: Diagnosis not present

## 2019-08-07 DIAGNOSIS — Z23 Encounter for immunization: Secondary | ICD-10-CM

## 2019-08-07 DIAGNOSIS — L03113 Cellulitis of right upper limb: Secondary | ICD-10-CM | POA: Diagnosis not present

## 2019-08-07 MED ORDER — CEFTRIAXONE SODIUM 1 G IJ SOLR
1.0000 g | Freq: Once | INTRAMUSCULAR | Status: AC
  Administered 2019-08-07: 1 g via INTRAMUSCULAR

## 2019-08-07 MED ORDER — CEPHALEXIN 500 MG PO CAPS: 500.0000 mg | ORAL_CAPSULE | Freq: Two times a day (BID) | ORAL | 0 refills | Status: DC

## 2019-08-07 NOTE — Patient Instructions (Signed)

## 2019-08-07 NOTE — Telephone Encounter (Signed)
Called and scheduled today with Dr. Carollee Herter at 10:20

## 2019-08-07 NOTE — Telephone Encounter (Signed)
After Hours Call:   Caller states, was at ER Sat, got a bee sting, very swollen and red. Needs an appt. today. Urgent needs an appt. today.

## 2019-08-07 NOTE — Progress Notes (Signed)
Subjective:    Noah Lane is a 64 y.o. male who presents for evaluation of a possible skin infection located R 5th finger . Symptoms include erythema located r 5th finger and swelling. Patient denies chills and fever greater than 100. Precipitating event: bee sting. Treatment to date has included OTC analgesics, elevation of the area and antibiotics started a few days ago with minimal relief.  The following portions of the patient's history were reviewed and updated as appropriate:  He  has a past medical history of Diabetes mellitus without complication ([place]). He does not have any pertinent problems on file. He  has a past surgical history that includes Hernia repair (2012). His family history includes Cancer in his father; Diabetes in his father; Heart disease in his mother. He  reports that he has quit smoking. He has never used smokeless tobacco. He reports current alcohol use. He reports that he does not use drugs. He has a current medication list which includes the following prescription(s): alpha-lipoic acid, apple cider vinegar, atorvastatin, garlic, green tea (camellia sinensis), naproxen, tadalafil, and cephalexin. Current Outpatient Medications on File Prior to Visit  Medication Sig Dispense Refill  . Alpha-Lipoic Acid 300 MG CAPS Takes four times daily    . APPLE CIDER VINEGAR PO     . atorvastatin (LIPITOR) 10 MG tablet Take 5 mg by mouth daily.    . Garlic 10 MG CAPS Take four times per day    . Green Tea, Camellia sinensis, (GREEN TEA EXTRACT PO) Take four times per day    . naproxen (NAPROSYN) 500 MG tablet Take 500 mg by mouth 2 (two) times daily.    . tadalafil (CIALIS) 20 MG tablet TAKE ONE TABLET BY MOUTH ONE TIME DAILY AS NEEDED FOR ERECTILE DYSFUNCTION 30 tablet 5   No current facility-administered medications on file prior to visit.   He has No Known Allergies..  Review of Systems Pertinent items are noted in HPI.     Objective:    BP (!) 148/80 (BP Location:  Right Arm, Patient Position: Sitting, Cuff Size: Large)   Pulse 67   Temp (!) 97 F (36.1 C) (Temporal)   Resp 18   Ht 6\' 1"  (1.854 m)   Wt 258 lb 3.2 oz (117.1 kg)   SpO2 97%   BMI 34.07 kg/m  General appearance: alert, cooperative, appears stated age and no distress Lungs: clear to auscultation bilaterally Heart: regular rate and rhythm, S1, S2 normal, no murmur, click, rub or gallop Extremities: R 5th finger swollen and red  --- tender to touch Skin: Skin color, texture, turgor normal. No rashes or lesions or erythema - hand(s) right     Assessment:    Cellulitis of the R hand 5th finger .    Plan:    Antibiotics given in office.    Rocephin 1 gm IM and tetanus updated  1. Cellulitis of right upper extremity 5th finger   - cephALEXin (KEFLEX) 500 MG capsule; Take 1 capsule (500 mg total) by mouth 2 (two) times daily.  Dispense: 20 capsule; Refill: 0 - cefTRIAXone (ROCEPHIN) injection 1 g  2. Bee sting, accidental or unintentional, initial encounter Tetanus updated  - cefTRIAXone (ROCEPHIN) injection 1 g

## 2019-08-08 ENCOUNTER — Ambulatory Visit: Payer: Medicare Other | Admitting: Physical Therapy

## 2019-08-09 ENCOUNTER — Other Ambulatory Visit: Payer: Self-pay

## 2019-08-09 ENCOUNTER — Ambulatory Visit: Payer: Medicare Other | Admitting: Physical Therapy

## 2019-08-09 ENCOUNTER — Encounter: Payer: Self-pay | Admitting: Physical Therapy

## 2019-08-09 DIAGNOSIS — M25551 Pain in right hip: Secondary | ICD-10-CM | POA: Diagnosis not present

## 2019-08-09 DIAGNOSIS — M545 Low back pain, unspecified: Secondary | ICD-10-CM

## 2019-08-09 DIAGNOSIS — M25552 Pain in left hip: Secondary | ICD-10-CM

## 2019-08-09 DIAGNOSIS — R262 Difficulty in walking, not elsewhere classified: Secondary | ICD-10-CM

## 2019-08-09 NOTE — Patient Instructions (Signed)
Access Code: [email or web address] URL: [email or web address] Date: 08/09/2019 Prepared by: Lum Babe  Exercises Supine Hamstring Stretch with Strap - 1 x daily - 7 x weekly - 1 sets - 10 reps - 30 hold Supine ITB Stretch with Strap - 1 x daily - 7 x weekly - 1 sets - 10 reps - 30 hold Supine Piriformis Stretch Pulling Heel to Hip - 1 x daily - 7 x weekly - 1 sets - 10 reps - 30 hold Supine Quadriceps Stretch with Strap on Table - 1 x daily - 7 x weekly - 1 sets - 10 reps - 30 hold Standing Hip Extension with Anchored Resistance - 1 x daily - 7 x weekly - 2 sets - 10 reps - 2 hold Standing Hip Abduction with Anchored Resistance - 1 x daily - 7 x weekly - 2 sets - 10 reps - 2 hold Standing Anti-Rotation Press with Anchored Resistance - 1 x daily - 7 x weekly - 2 sets - 10 reps - 2 hold Shoulder Extension with Resistance - Palms Forward - 1 x daily - 7 x weekly - 2 sets - 10 reps - 2 hold Abdominal Press into [place] - 1 x daily - 7 x weekly - 2 sets - 10 reps - 3 hold

## 2019-08-09 NOTE — Therapy (Signed)
[address] [place], [place], [address] Phone: [phone]   Fax:  [phone]  Physical Therapy Treatment  Patient Details  Name: Noah Lane MRN: [id number] Date of Birth: Apr 10, 1955 Referring Provider (PT): Riki Sheer, DO   Encounter Date: 08/09/2019   PT End of Session - 08/09/19 1608    Visit Number 15    Date for PT Re-Evaluation 08/10/19    Authorization Type Medicare    PT Start Time 1525    PT Stop Time 1608    PT Time Calculation (min) 43 min    Activity Tolerance Patient tolerated treatment well    Behavior During Therapy [facility] for tasks assessed/performed           Past Medical History:  Diagnosis Date  . Diabetes mellitus without complication [facility])     Past Surgical History:  Procedure Laterality Date  . HERNIA REPAIR  2012    There were no vitals filed for this visit.   Subjective Assessment - 08/09/19 1527    Subjective Reports that he is working a new job and is on his feet, walking 15, 00 steps, really sore in the low backj    Currently in Pain? Yes    Pain Score 6     Pain Location Back    Pain Orientation Right;Lower    Pain Descriptors / Indicators Tightness;Spasm    Aggravating Factors  standing and walking                             [facility] Adult PT Treatment/Exercise - 08/09/19 0001      Lumbar Exercises: Stretches   Passive Hamstring Stretch Right;Left;4 reps;20 seconds    Quad Stretch Right;Left;4 reps;20 seconds    Piriformis Stretch Right;Left;4 reps;20 seconds      Lumbar Exercises: Aerobic   Nustep L4 x 7 min       Lumbar Exercises: Machines for Strengthening   Cybex Knee Extension 25# 2x15    Cybex Knee Flexion 35# 2x15                    PT Short Term Goals - 06/19/19 1601      PT SHORT TERM GOAL #1   Title Pt will be I and compliant with initial HEP.    Status Achieved             PT Long Term Goals  - 08/09/19 1610      PT LONG TERM GOAL #1   Title Pt will increase lumbar AROM to [facility] with no pain.    Status Partially Met      PT LONG TERM GOAL #2   Title Pt will report ability to walk dogs for at least 30 minutes at a time with no onset of BLE weakness.    Status Achieved      PT LONG TERM GOAL #3   Title Pt will be independent with advanced HEP + progressions for long term maintenance.    Status Partially Met                 Plan - 08/09/19 1608    Clinical Impression Statement Patient reports that he is doing better, still hurting but he is much more active, started a job where he is walking and doing a lot more, I gave more education on HEP for core, hips and flexibility.    PT  Next Visit Plan gave him more for HEP, he may decrease frequency to 1x/week, if he comes back we will need to do a renewal    Consulted and Agree with Plan of Care Patient           Patient will benefit from skilled therapeutic intervention in order to improve the following deficits and impairments:  Hypomobility, Increased fascial restricitons, Impaired flexibility, Impaired perceived functional ability, Postural dysfunction, Decreased mobility, Decreased activity tolerance  Visit Diagnosis: Difficulty in walking, not elsewhere classified  Acute bilateral low back pain without sciatica  Pain in right hip  Pain in left hip     Problem List Patient Active Problem List   Diagnosis Date Noted  . Hip pain, bilateral 05/28/2019  . Chronic bilateral low back pain without sciatica 05/28/2019  . Diabetes mellitus type 2 in obese ([place]) 05/28/2019  . Erectile dysfunction 05/28/2019  . Pain and swelling of left knee 03/12/2019  . Lateral epicondylitis, right elbow 03/15/2017    Sumner Boast., PT 08/09/2019, 4:11 PM  [address] [place], [place], [address] Phone: [phone]   Fax:  [phone]  Name:  Noah Lane MRN: [id number] Date of Birth: 1955/06/19

## 2019-08-13 ENCOUNTER — Other Ambulatory Visit: Payer: Self-pay

## 2019-08-13 ENCOUNTER — Ambulatory Visit (INDEPENDENT_AMBULATORY_CARE_PROVIDER_SITE_OTHER): Payer: Medicare Other | Admitting: Family Medicine

## 2019-08-13 ENCOUNTER — Ambulatory Visit: Payer: Medicare Other | Admitting: Physical Therapy

## 2019-08-13 ENCOUNTER — Encounter: Payer: Self-pay | Admitting: Family Medicine

## 2019-08-13 VITALS — BP 143/83 | HR 73 | Resp 16

## 2019-08-13 DIAGNOSIS — L03019 Cellulitis of unspecified finger: Secondary | ICD-10-CM | POA: Diagnosis not present

## 2019-08-13 DIAGNOSIS — T63481A Toxic effect of venom of other arthropod, accidental (unintentional), initial encounter: Secondary | ICD-10-CM | POA: Diagnosis not present

## 2019-08-13 MED ORDER — SULFAMETHOXAZOLE-TRIMETHOPRIM 800-160 MG PO TABS: 1.0000 | ORAL_TABLET | Freq: Two times a day (BID) | ORAL | 0 refills | Status: AC

## 2019-08-13 NOTE — Progress Notes (Signed)
Chief Complaint  Patient presents with  . Hand Pain    Noah Lane is a 64 y.o. male here for a skin complaint.  Duration: 10 days Location: R pinky finger Pruritic? No Painful? Yes Drainage? No New soaps/lotions/topicals/detergents? No Sick contacts? No Other associated symptoms: stung by bee; swelling in hand; no fevers Therapies tried thus far: Rocephin, Keflex, NSAIDs  Past Medical History:  Diagnosis Date  . Diabetes mellitus without complication (HCC)     BP (!) 143/83 (BP Location: Left Arm, Patient Position: Sitting, Cuff Size: Large)   Pulse 73   Resp 16   SpO2 99%  Gen: awake, alert, appearing stated age Lungs: No accessory muscle use Skin: See below. No drainage, erythema, TTP, fluctuance, excoriation Psych: Age appropriate judgment and insight      Procedure note; incision and drainage Informed consent obtained. The area was cleaned with alcohol. The area was anesthetized with 0.3 mL of 2% lidocaine without epinephrine. Once adequate anesthesia was obtained, a cruciate incision was made with 11 blade scalpel. Approximately 1 mL of purulent material with blood was expressed. The area was then dressed with gauze. There were no complications noted. The patient tolerated the procedure well.  Insect stings, accidental or unintentional, initial encounter - Plan: sulfamethoxazole-trimethoprim (BACTRIM DS) 800-160 MG tablet  Felon of finger - Plan: sulfamethoxazole-trimethoprim (BACTRIM DS) 800-160 MG tablet, PR DRAIN SKIN ABSCESS SIMPLE  Aftercare instructions verbalized and written down. I&D today. Add Bactrim, cont Keflex. F/u in 2 d if no better, will re-eval and refer to hand urgently. If worsening, go to ER.  The patient voiced understanding and agreement to the plan.  [place], DO 08/13/19 3:01 PM

## 2019-08-13 NOTE — Patient Instructions (Signed)
Do not shower for the rest of the day. When you do wash it, use only soap and water. Do not vigorously scrub. Keep the area clean and dry.   Things to look out for: increasing pain not relieved by ibuprofen/acetaminophen, fevers, spreading redness, drainage of pus, or foul odor.  We are adding a new antibiotic to your regimen. Finish the Keflex.  If you are improving, OK to cancel appointment in 2 days.  If worsening pain, fevers, spreading redness, seek emergent care.  Let us know if you need anything.

## 2019-08-15 ENCOUNTER — Encounter: Payer: Medicare Other | Admitting: Physical Therapy

## 2019-08-15 ENCOUNTER — Ambulatory Visit: Payer: Medicare Other | Admitting: Family Medicine

## 2019-08-20 ENCOUNTER — Encounter: Payer: Medicare Other | Admitting: Physical Therapy

## 2019-08-22 ENCOUNTER — Encounter: Payer: Medicare Other | Admitting: Physical Therapy

## 2019-09-06 ENCOUNTER — Encounter: Payer: Self-pay | Admitting: Family Medicine

## 2019-12-06 ENCOUNTER — Other Ambulatory Visit: Payer: Self-pay

## 2019-12-06 ENCOUNTER — Ambulatory Visit: Payer: Medicare Other | Attending: Internal Medicine

## 2019-12-06 DIAGNOSIS — Z23 Encounter for immunization: Secondary | ICD-10-CM

## 2019-12-06 NOTE — Progress Notes (Signed)
   Covid-19 Vaccination Clinic  Name:  Noah Lane    MRN: [id number] DOB: Jun 01, 1955  12/06/2019  Noah Lane was observed post Covid-19 immunization for 15 minutes without incident. He was provided with Vaccine Information Sheet and instruction to access the V-Safe system.   Noah Lane was instructed to call 911 with any severe reactions post vaccine: Marland Kitchen Difficulty breathing  . Swelling of face and throat  . A fast heartbeat  . A bad rash all over body  . Dizziness and weakness

## 2019-12-24 ENCOUNTER — Telehealth: Payer: Self-pay | Admitting: Family Medicine

## 2019-12-24 NOTE — Telephone Encounter (Signed)
appt scheduled

## 2019-12-24 NOTE — Telephone Encounter (Signed)
Sched him tomorrow please. Ty.
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
## 2019-12-25 ENCOUNTER — Ambulatory Visit (INDEPENDENT_AMBULATORY_CARE_PROVIDER_SITE_OTHER): Payer: Medicare Other | Admitting: Family Medicine

## 2019-12-25 ENCOUNTER — Other Ambulatory Visit: Payer: Self-pay

## 2019-12-25 ENCOUNTER — Encounter: Payer: Self-pay | Admitting: Family Medicine

## 2019-12-25 VITALS — BP 138/70 | HR 65 | Temp 98.8°F | Ht 73.0 in | Wt 259.4 lb

## 2019-12-25 DIAGNOSIS — M25511 Pain in right shoulder: Secondary | ICD-10-CM | POA: Diagnosis not present

## 2019-12-25 DIAGNOSIS — M79642 Pain in left hand: Secondary | ICD-10-CM

## 2019-12-25 MED ORDER — METHYLPREDNISOLONE ACETATE 40 MG/ML IJ SUSP: 40.0000 mg | Freq: Once | INTRAMUSCULAR | Status: DC

## 2019-12-25 MED ORDER — METHYLPREDNISOLONE ACETATE 40 MG/ML IJ SUSP
40.0000 mg | Freq: Once | INTRAMUSCULAR | Status: AC
  Administered 2019-12-25: 40 mg via INTRA_ARTICULAR

## 2019-12-25 NOTE — Patient Instructions (Addendum)
Heat (pad or rice pillow in microwave) over affected area, 10-15 minutes twice daily.   Ice/cold pack over area for 10-15 min twice daily.  OK to take Tylenol 1000 mg (2 extra strength tabs) or 975 mg (3 regular strength tabs) every 6 hours as needed.  If you do not hear anything about your referral in the next 1-2 weeks, call our office and ask for an update.  Let us know if you need anything.  EXERCISES  RANGE OF MOTION (ROM) AND STRETCHING EXERCISES These exercises may help you when beginning to rehabilitate your injury. While completing these exercises, remember:   Restoring tissue flexibility helps normal motion to return to the joints. This allows healthier, less painful movement and activity.   An effective stretch should be held for at least 30 seconds.  A stretch should never be painful. You should only feel a gentle lengthening or release in the stretched tissue.  ROM - Pendulum  Bend at the waist so that your right / left arm falls away from your body. Support yourself with your opposite hand on a solid surface, such as a table or a countertop.  Your right / left arm should be perpendicular to the ground. If it is not perpendicular, you need to lean over farther. Relax the muscles in your right / left arm and shoulder as much as possible.  Gently sway your hips and trunk so they move your right / left arm without any use of your right / left shoulder muscles.  Progress your movements so that your right / left arm moves side to side, then forward and backward, and finally, both clockwise and counterclockwise.  Complete 10-15 repetitions in each direction. Many people use this exercise to relieve discomfort in their shoulder as well as to gain range of motion. Repeat 2 times. Complete this exercise 3 times per week.  STRETCH - Flexion, Standing  Stand with good posture. With an underhand grip on your right / left hand and an overhand grip on the opposite hand, grasp a  broomstick or cane so that your hands are a little more than shoulder-width apart.  Keeping your right / left elbow straight and shoulder muscles relaxed, push the stick with your opposite hand to raise your right / left arm in front of your body and then overhead. Raise your arm until you feel a stretch in your right / left shoulder, but before you have increased shoulder pain.  Try to avoid shrugging your right / left shoulder as your arm rises by keeping your shoulder blade tucked down and toward your mid-back spine. Hold 30 seconds.  Slowly return to the starting position. Repeat 2 times. Complete this exercise 3 times per week.  STRETCH - Internal Rotation  Place your right / left hand behind your back, palm-up.  Throw a towel or belt over your opposite shoulder. Grasp the towel/belt with your right / left hand.  While keeping an upright posture, gently pull up on the towel/belt until you feel a stretch in the front of your right / left shoulder.  Avoid shrugging your right / left shoulder as your arm rises by keeping your shoulder blade tucked down and toward your mid-back spine.  Hold 30. Release the stretch by lowering your opposite hand. Repeat 2 times. Complete this exercise 3 times per week.  STRETCH - External Rotation and Abduction  Stagger your stance through a doorframe. It does not matter which foot is forward.  As instructed by your  physician, physical therapist or athletic trainer, place your hands: ? And forearms above your head and on the door frame. ? And forearms at head-height and on the door frame. ? At elbow-height and on the door frame.  Keeping your head and chest upright and your stomach muscles tight to prevent over-extending your low-back, slowly shift your weight onto your front foot until you feel a stretch across your chest and/or in the front of your shoulders.  Hold 30 seconds. Shift your weight to your back foot to release the stretch. Repeat 2  times. Complete this stretch 3 times per week.   STRENGTHENING EXERCISES  These exercises may help you when beginning to rehabilitate your injury. They may resolve your symptoms with or without further involvement from your physician, physical therapist or athletic trainer. While completing these exercises, remember:   Muscles can gain both the endurance and the strength needed for everyday activities through controlled exercises.  Complete these exercises as instructed by your physician, physical therapist or athletic trainer. Progress the resistance and repetitions only as guided.  You may experience muscle soreness or fatigue, but the pain or discomfort you are trying to eliminate should never worsen during these exercises. If this pain does worsen, stop and make certain you are following the directions exactly. If the pain is still present after adjustments, discontinue the exercise until you can discuss the trouble with your clinician.  If advised by your physician, during your recovery, avoid activity or exercises which involve actions that place your right / left hand or elbow above your head or behind your back or head. These positions stress the tissues which are trying to heal.  STRENGTH - Scapular Depression and Adduction  With good posture, sit on a firm chair. Supported your arms in front of you with pillows, arm rests or a table top. Have your elbows in line with the sides of your body.  Gently draw your shoulder blades down and toward your mid-back spine. Gradually increase the tension without tensing the muscles along the top of your shoulders and the back of your neck.  Hold for 3 seconds. Slowly release the tension and relax your muscles completely before completing the next repetition.  After you have practiced this exercise, remove the arm support and complete it in standing as well as sitting. Repeat 2 times. Complete this exercise 3 times per week.   STRENGTH - External  Rotators  Secure a rubber exercise band/tubing to a fixed object so that it is at the same height as your right / left elbow when you are standing or sitting on a firm surface.  Stand or sit so that the secured exercise band/tubing is at your side that is not injured.  Bend your elbow 90 degrees. Place a folded towel or small pillow under your right / left arm so that your elbow is a few inches away from your side.  Keeping the tension on the exercise band/tubing, pull it away from your body, as if pivoting on your elbow. Be sure to keep your body steady so that the movement is only coming from your shoulder rotating.  Hold 3 seconds. Release the tension in a controlled manner as you return to the starting position. Repeat 2 times. Complete this exercise 3 times per week.   STRENGTH - Supraspinatus  Stand or sit with good posture. Grasp a 2-3 lb weight or an exercise band/tubing so that your hand is "thumbs-up," like when you shake hands.  Slowly lift  your right / left hand from your thigh into the air, traveling about 30 degrees from straight out at your side. Lift your hand to shoulder height or as far as you can without increasing any shoulder pain. Initially, many people do not lift their hands above shoulder height.  Avoid shrugging your right / left shoulder as your arm rises by keeping your shoulder blade tucked down and toward your mid-back spine.  Hold for 3 seconds. Control the descent of your hand as you slowly return to your starting position. Repeat 2 times. Complete this exercise 3 times per week.   STRENGTH - Shoulder Extensors  Secure a rubber exercise band/tubing so that it is at the height of your shoulders when you are either standing or sitting on a firm arm-less chair.  With a thumbs-up grip, grasp an end of the band/tubing in each hand. Straighten your elbows and lift your hands straight in front of you at shoulder height. Step back away from the secured end of  band/tubing until it becomes tense.  Squeezing your shoulder blades together, pull your hands down to the sides of your thighs. Do not allow your hands to go behind you.  Hold for 3 seconds. Slowly ease the tension on the band/tubing as you reverse the directions and return to the starting position. Repeat 2 times. Complete this exercise 3 times per week.   STRENGTH - Scapular Retractors  Secure a rubber exercise band/tubing so that it is at the height of your shoulders when you are either standing or sitting on a firm arm-less chair.  With a palm-down grip, grasp an end of the band/tubing in each hand. Straighten your elbows and lift your hands straight in front of you at shoulder height. Step back away from the secured end of band/tubing until it becomes tense.  Squeezing your shoulder blades together, draw your elbows back as you bend them. Keep your upper arm lifted away from your body throughout the exercise.  Hold 3 seconds. Slowly ease the tension on the band/tubing as you reverse the directions and return to the starting position. Repeat 2 times. Complete this exercise 3 times per week.  STRENGTH - Scapular Depressors  Find a sturdy chair without wheels, such as a from a dining room table.  Keeping your feet on the floor, lift your bottom from the seat and lock your elbows.  Keeping your elbows straight, allow gravity to pull your body weight down. Your shoulders will rise toward your ears.  Raise your body against gravity by drawing your shoulder blades down your back, shortening the distance between your shoulders and ears. Although your feet should always maintain contact with the floor, your feet should progressively support less body weight as you get stronger.  Hold 3 seconds. In a controlled and slow manner, lower your body weight to begin the next repetition. Repeat 2 times. Complete this exercise 3 times per week.   This information is not intended to replace advice  given to you by your health care provider. Make sure you discuss any questions you have with your health care provider.  Document Released: 12/30/2004 Document Revised: 03/08/2014 Document Reviewed: 05/30/2008 Elsevier Interactive Patient Education [employer].
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
## 2019-12-28 ENCOUNTER — Encounter: Payer: Self-pay | Admitting: Family Medicine

## 2019-12-28 ENCOUNTER — Ambulatory Visit: Payer: Self-pay

## 2019-12-28 ENCOUNTER — Other Ambulatory Visit: Payer: Self-pay

## 2019-12-28 ENCOUNTER — Ambulatory Visit (INDEPENDENT_AMBULATORY_CARE_PROVIDER_SITE_OTHER): Payer: Medicare Other | Admitting: Family Medicine

## 2019-12-28 VITALS — BP 134/69 | HR 76 | Ht 73.0 in | Wt 245.0 lb

## 2019-12-28 DIAGNOSIS — S46011A Strain of muscle(s) and tendon(s) of the rotator cuff of right shoulder, initial encounter: Secondary | ICD-10-CM

## 2019-12-28 DIAGNOSIS — M25511 Pain in right shoulder: Secondary | ICD-10-CM

## 2019-12-28 DIAGNOSIS — M7581 Other shoulder lesions, right shoulder: Secondary | ICD-10-CM | POA: Insufficient documentation

## 2019-12-28 MED ORDER — NITROGLYCERIN 0.2 MG/HR TD PT24: MEDICATED_PATCH | TRANSDERMAL | 11 refills | Status: DC

## 2019-12-28 NOTE — Patient Instructions (Addendum)
Nice to meet you  Please do not use the nitro patches with cialis  Physical therapy will give you a call.  Please send me a message in MyChart with any questions or updates.  Please see me back in 4 weeks.   --Dr. Raeford Razor   Nitroglycerin Protocol   Apply 1/4 nitroglycerin patch to affected area daily.  Change position of patch within the affected area every 24 hours.  You may experience a headache during the first 1-2 weeks of using the patch, these should subside.  If you experience headaches after beginning nitroglycerin patch treatment, you may take your preferred over the counter pain reliever.  Another side effect of the nitroglycerin patch is skin irritation or rash related to patch adhesive.  Please notify our office if you develop more severe headaches or rash, and stop the patch.  Tendon healing with nitroglycerin patch may require 12 to 24 weeks depending on the extent of injury.  Men should not use if taking Viagra, Cialis, or Levitra.   Do not use if you have migraines or rosacea.
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
## 2020-01-30 ENCOUNTER — Ambulatory Visit: Payer: Medicare Other | Admitting: Family Medicine

## 2020-03-02 ENCOUNTER — Encounter (HOSPITAL_BASED_OUTPATIENT_CLINIC_OR_DEPARTMENT_OTHER): Payer: Self-pay

## 2020-03-02 ENCOUNTER — Other Ambulatory Visit: Payer: Self-pay

## 2020-03-02 ENCOUNTER — Emergency Department (HOSPITAL_BASED_OUTPATIENT_CLINIC_OR_DEPARTMENT_OTHER): Payer: Medicare Other

## 2020-03-02 ENCOUNTER — Emergency Department (HOSPITAL_BASED_OUTPATIENT_CLINIC_OR_DEPARTMENT_OTHER)
Admission: EM | Admit: 2020-03-02 | Discharge: 2020-03-02 | Disposition: A | Discharge status: Home / Self Care | Payer: Medicare Other | Attending: Emergency Medicine | Admitting: Emergency Medicine

## 2020-03-02 DIAGNOSIS — Y9301 Activity, walking, marching and hiking: Secondary | ICD-10-CM | POA: Diagnosis not present

## 2020-03-02 DIAGNOSIS — E119 Type 2 diabetes mellitus without complications: Secondary | ICD-10-CM | POA: Insufficient documentation

## 2020-03-02 DIAGNOSIS — S92351A Displaced fracture of fifth metatarsal bone, right foot, initial encounter for closed fracture: Secondary | ICD-10-CM | POA: Diagnosis not present

## 2020-03-02 DIAGNOSIS — S8991XA Unspecified injury of right lower leg, initial encounter: Secondary | ICD-10-CM | POA: Diagnosis present

## 2020-03-02 DIAGNOSIS — Z87891 Personal history of nicotine dependence: Secondary | ICD-10-CM | POA: Diagnosis not present

## 2020-03-02 DIAGNOSIS — X58XXXA Exposure to other specified factors, initial encounter: Secondary | ICD-10-CM | POA: Diagnosis not present

## 2020-03-02 MED ORDER — IBUPROFEN 400 MG PO TABS
400.0000 mg | ORAL_TABLET | Freq: Once | ORAL | Status: AC | PRN
  Administered 2020-03-02: 400 mg via ORAL
  Filled 2020-03-02: qty 1
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
## 2020-03-02 NOTE — ED Notes (Signed)
Pt. Reports he broke his toe while walking

## 2020-03-02 NOTE — ED Triage Notes (Signed)
Pt states Friday was walking and his rolled his foot. Now complaining of lateral lower foot pain.

## 2020-03-02 NOTE — Discharge Instructions (Signed)
As discussed, you broke your 5th metatarsal. Keep splint on until you are evaluated by the orthopedic surgeon. You may take over the counter ibuprofen or tylenol as needed for pain. Continue to elevate leg. I have included the number of the orthopedic surgeon. Call tomorrow to schedule an appointment for further evaluation. Return to the ER for new or worsening symptoms.

## 2020-05-23 ENCOUNTER — Encounter: Payer: Self-pay | Admitting: Family Medicine

## 2020-05-23 ENCOUNTER — Other Ambulatory Visit: Payer: Self-pay

## 2020-05-23 ENCOUNTER — Telehealth: Payer: Self-pay | Admitting: Family Medicine

## 2020-05-23 ENCOUNTER — Ambulatory Visit (INDEPENDENT_AMBULATORY_CARE_PROVIDER_SITE_OTHER): Payer: Medicare Other | Admitting: Family Medicine

## 2020-05-23 VITALS — BP 142/78 | HR 88 | Temp 98.4°F | Ht 73.0 in | Wt 265.5 lb

## 2020-05-23 DIAGNOSIS — N529 Male erectile dysfunction, unspecified: Secondary | ICD-10-CM

## 2020-05-23 DIAGNOSIS — I951 Orthostatic hypotension: Secondary | ICD-10-CM | POA: Diagnosis not present

## 2020-05-23 DIAGNOSIS — E1169 Type 2 diabetes mellitus with other specified complication: Secondary | ICD-10-CM

## 2020-05-23 DIAGNOSIS — E669 Obesity, unspecified: Secondary | ICD-10-CM

## 2020-05-23 MED ORDER — ATORVASTATIN CALCIUM 20 MG PO TABS: 20.0000 mg | ORAL_TABLET | Freq: Every day | ORAL | 3 refills | Status: DC

## 2020-05-23 MED ORDER — SILDENAFIL CITRATE 100 MG PO TABS: 50.0000 mg | ORAL_TABLET | Freq: Every day | ORAL | 2 refills | Status: DC | PRN

## 2020-05-23 NOTE — Progress Notes (Signed)
CC: Dizziness  Noah Lane is 65 y.o. pt here for dizziness.  Started around 2 weeks ago.  Duration:can last the whole day Pass out? No Spinning? No Recent illness/fever? No Headache? No Neurologic signs? No Change in PO intake? No  ED Still having issues w climaxing despite Cialis. He has not been on Viagra. No AE's.   Past Medical History:  Diagnosis Date  . Diabetes mellitus without complication ([place])     Family History  Problem Relation Age of Onset  . Heart disease Mother   . Diabetes Father   . Cancer Father        post age of 86    BP (!) 142/78 (BP Location: Left Arm, Patient Position: Sitting, Cuff Size: Normal)   Pulse 88   Temp 98.4 F (36.9 C) (Oral)   Ht 6\' 1"  (1.854 m)   Wt 265 lb 8 oz (120.4 kg)   SpO2 99%   BMI 35.03 kg/m  General: Awake, alert, appears stated age Eyes: PERRLA, EOMi Ears: Patent, TM's neg b/l Heart: RRR, no murmurs, no carotid bruits Lungs: CTAB, no accessory muscle use MSK: 5/5 strength throughout, gait normal Neuro: No cerebellar signs, patellar reflex 1/4 b/l wo clonus, calcaneal reflex 0/4 b/l wo clonus, biceps reflex 1/4 b/l wo clonus; Dix-Hall-Pike negative b/l; he did become lightheaded when he sat up Psych: Age appropriate judgment and insight, normal mood and affect  Orthostasis  Erectile dysfunction, unspecified erectile dysfunction type  Diabetes mellitus type 2 in obese [facility]) - Plan: Comprehensive metabolic panel, CBC, Hemoglobin A1c, Lipid panel  1. He is not drinking enough water. Try to get around 55 oz/d.  2. Trial Viagra instead of Cialis. Refer to urology if no improvement. 3. Ck labs.  F/u pending results of labs. Pt voiced understanding and agreement to the plan.  [place], DO 05/23/20 4:30 PM

## 2020-05-23 NOTE — Telephone Encounter (Signed)
Medication:atorvastatin (LIPITOR) 20 MG tablet [[id number]]   sildenafil (VIAGRA) 100 MG tablet [[id number]]     Has the patient contacted their pharmacy? no (If no, request that the patient contact the pharmacy for the refill.) (If yes, when and what did the pharmacy advise?)    Preferred Pharmacy (with phone number or street name):   [name] [address] [place] Phone:  [phone]  Fax:  [phone]         Agent: Please be advised that RX refills may take up to 3 business days. We ask that you follow-up with your pharmacy.

## 2020-05-23 NOTE — Patient Instructions (Addendum)
Try to drink 55-60 oz of water daily outside of exercise.  Give [place] 2-3 business days to get the results of your labs back.   Keep the diet clean and stay active.

## 2020-05-23 NOTE — Telephone Encounter (Signed)
Refills done.

## 2020-05-24 ENCOUNTER — Other Ambulatory Visit: Payer: Self-pay | Admitting: Family Medicine

## 2020-05-24 LAB — LIPID PANEL
Cholesterol: 224 mg/dL — ABNORMAL HIGH (ref <200)
HDL: 29 mg/dL — ABNORMAL LOW (ref >=40)
Non-HDL Cholesterol (Calc): 195 mg/dL (calc) — ABNORMAL HIGH (ref <130)
Total CHOL/HDL Ratio: 7.7 (calc) — ABNORMAL HIGH (ref <5.0)
Triglycerides: 928 mg/dL — ABNORMAL HIGH (ref <150)

## 2020-05-24 LAB — CBC
HCT: 44.3 % (ref 38.5–50.0)
Hemoglobin: 14.7 g/dL (ref 13.2–17.1)
MCH: 28.7 pg (ref 27.0–33.0)
MCHC: 33.2 g/dL (ref 32.0–36.0)
MCV: 86.5 fL (ref 80.0–100.0)
MPV: 13.1 fL — ABNORMAL HIGH (ref 7.5–12.5)
Platelets: 186 10*3/uL (ref 140–400)
RBC: 5.12 10*6/uL (ref 4.20–5.80)
RDW: 12.3 % (ref 11.0–15.0)
WBC: 8.1 10*3/uL (ref 3.8–10.8)

## 2020-05-24 LAB — COMPREHENSIVE METABOLIC PANEL
AG Ratio: 2.1 (calc) (ref 1.0–2.5)
ALT: 16 U/L (ref 9–46)
AST: 11 U/L (ref 10–35)
Albumin: 4.4 g/dL (ref 3.6–5.1)
Alkaline phosphatase (APISO): 54 U/L (ref 35–144)
BUN: 24 mg/dL (ref 7–25)
CO2: 21 mmol/L (ref 20–32)
Calcium: 8.9 mg/dL (ref 8.6–10.3)
Chloride: 99 mmol/L (ref 98–110)
Creat: 1.11 mg/dL (ref 0.70–1.25)
Globulin: 2.1 g/dL (calc) (ref 1.9–3.7)
Glucose, Bld: 438 mg/dL — ABNORMAL HIGH (ref 65–99)
Potassium: 4.8 mmol/L (ref 3.5–5.3)
Sodium: 133 mmol/L — ABNORMAL LOW (ref 135–146)
Total Bilirubin: 0.6 mg/dL (ref 0.2–1.2)
Total Protein: 6.5 g/dL (ref 6.1–8.1)

## 2020-05-24 LAB — HEMOGLOBIN A1C
Hgb A1c MFr Bld: 9.1 % of total Hgb — ABNORMAL HIGH (ref <5.7)
Mean Plasma Glucose: 214 mg/dL
eAG (mmol/L): 11.9 mmol/L

## 2020-05-24 MED ORDER — METFORMIN HCL 500 MG PO TABS: ORAL_TABLET | ORAL | 1 refills | Status: DC

## 2020-05-26 ENCOUNTER — Telehealth: Payer: Self-pay | Admitting: *Deleted

## 2020-05-26 NOTE — Telephone Encounter (Signed)
Patient already notified.   Lab Name [facility] Phone Number [phone] Lab Tech Name Windle Guard Reference Number [id number] R Chief Complaint Lab Result (Critical or Stat) Call Type Lab Send to RN Reason for Call Report lab results Initial Comment Caller has urgent lab results Translation No Nurse Assessment Nurse: Fredderick Phenix, RN, Lelan Pons Date/Time Eilene Ghazi Time): 05/24/2020 2:42:48 PM Confirm and document reason for call. If symptomatic, describe symptoms. ---see lab assessment Does the patient have any new or worsening symptoms? ---Yes Will a triage be completed? ---Yes Related visit to physician within the last 2 weeks? ---Yes Does the PT have any chronic conditions? (i.e. diabetes, asthma, this includes High risk factors for pregnancy, etc.) ---Unknown Is this a behavioral health or substance abuse call? ---No Nurse: Fredderick Phenix, RN, Lelan Pons Date/Time (Eastern Time): 05/24/2020 2:43:35 PM Is there an on-call provider listed? ---Yes Please list name of person reporting value (Lab Employee) and a contact number. ---Caryl Pina at [facility] [phone] Please document the following items: Lab name Lab value (read back to lab to verify) Reference range for lab value Date and time blood was drawn Collect time of birth for bilirubin results ---Glucose 438 Collected 05/23/20 at 4:28 pm Please collect the patient contact information from the lab. (name, phone number and address) ---[phone]

## 2020-11-12 ENCOUNTER — Other Ambulatory Visit: Payer: Self-pay | Admitting: Family Medicine

## 2020-11-12 DIAGNOSIS — N529 Male erectile dysfunction, unspecified: Secondary | ICD-10-CM

## 2020-11-12 NOTE — Telephone Encounter (Signed)
Viagra is on his list

## 2020-11-12 NOTE — Telephone Encounter (Signed)
Called the patient and he wants Cialis

## 2020-12-08 ENCOUNTER — Ambulatory Visit: Payer: Medicare Other | Attending: Internal Medicine

## 2020-12-08 DIAGNOSIS — Z23 Encounter for immunization: Secondary | ICD-10-CM

## 2020-12-08 NOTE — Progress Notes (Signed)
   Covid-19 Vaccination Clinic  Name:  Jaquavious Mercer    MRN: [id number] DOB: 1955/08/24  12/08/2020  Mr. Kerner was observed post Covid-19 immunization for 15 minutes without incident. He was provided with Vaccine Information Sheet and instruction to access the V-Safe system.   Mr. Nishi was instructed to call 911 with any severe reactions post vaccine: Difficulty breathing  Swelling of face and throat  A fast heartbeat  A bad rash all over body  Dizziness and weakness

## 2020-12-17 ENCOUNTER — Other Ambulatory Visit (HOSPITAL_BASED_OUTPATIENT_CLINIC_OR_DEPARTMENT_OTHER): Payer: Self-pay

## 2020-12-17 MED ORDER — COVID-19MRNA BIVAL VACC PFIZER 30 MCG/0.3ML IM SUSP
INTRAMUSCULAR | 0 refills | Status: DC
Start: 2020-12-08 — End: 2021-10-20
  Filled 2020-12-17: qty 0.3, 1d supply, fill #0

## 2021-01-06 ENCOUNTER — Encounter: Payer: Self-pay | Admitting: Family Medicine

## 2021-01-06 ENCOUNTER — Other Ambulatory Visit: Payer: Self-pay

## 2021-01-06 ENCOUNTER — Ambulatory Visit (INDEPENDENT_AMBULATORY_CARE_PROVIDER_SITE_OTHER): Payer: Medicare Other | Admitting: Family Medicine

## 2021-01-06 VITALS — BP 138/80 | HR 70 | Temp 98.7°F | Ht 73.0 in | Wt 263.5 lb

## 2021-01-06 DIAGNOSIS — Z1211 Encounter for screening for malignant neoplasm of colon: Secondary | ICD-10-CM

## 2021-01-06 DIAGNOSIS — R5383 Other fatigue: Secondary | ICD-10-CM | POA: Diagnosis not present

## 2021-01-06 DIAGNOSIS — Z23 Encounter for immunization: Secondary | ICD-10-CM

## 2021-01-06 DIAGNOSIS — Z1159 Encounter for screening for other viral diseases: Secondary | ICD-10-CM | POA: Diagnosis not present

## 2021-01-06 DIAGNOSIS — E1169 Type 2 diabetes mellitus with other specified complication: Secondary | ICD-10-CM | POA: Diagnosis not present

## 2021-01-06 DIAGNOSIS — E669 Obesity, unspecified: Secondary | ICD-10-CM

## 2021-01-06 MED ORDER — ATORVASTATIN CALCIUM 20 MG PO TABS: 20.0000 mg | ORAL_TABLET | Freq: Every day | ORAL | 3 refills | Status: DC

## 2021-01-06 NOTE — Progress Notes (Signed)
Subjective:   Chief Complaint  Patient presents with   Follow-up    Noah Lane is a 65 y.o. male here for follow-up of diabetes.   Adael's self monitored glucose range is 120-160's.  Patient denies hypoglycemic reactions. He checks his glucose levels 3 time(s) per week on average. Patient does not require insulin.   Medications include: stopped metformin, currently not taking anything He stopped his Lipitor as well. Exercise: some walking  Has felt fatigued over the past several weeks. He did break his foot and had walked up some steps for work and strained his piriformis. He has been quite limited with his walking due to this.   Past Medical History:  Diagnosis Date   Diabetes mellitus without complication ([place])      Related testing: Retinal exam: Done Pneumovax: Due for PCV20  Objective:  BP 138/80   Pulse 70   Temp 98.7 F (37.1 C) (Oral)   Ht 6\' 1"  (1.854 m)   Wt 263 lb 8 oz (119.5 kg)   SpO2 96%   BMI 34.76 kg/m  General:  Well developed, well nourished, in no apparent distress Skin:  Warm, no pallor or diaphoresis Lungs:  CTAB, no access msc use Cardio:  RRR, no bruits, no LE edema Musculoskeletal:  Symmetrical muscle groups noted without atrophy or deformity Neuro:  Sensation decreased to pinprick on feet b/l Psych: Age appropriate judgment and insight  Assessment:   Diabetes mellitus type 2 in obese ([place]) - Plan: CBC, Comprehensive metabolic panel, Hemoglobin A1c, Lipid panel, Microalbumin / creatinine urine ratio  Need for influenza vaccination - Plan: Flu Vaccine QUAD High Dose(Fluad)  Encounter for hepatitis C screening test for low risk patient - Plan: Hepatitis C antibody  Other fatigue - Plan: TSH  Screen for colon cancer - Plan: Ambulatory referral to Gastroenterology   Plan:   Ck labs, future. Chronic, uncontrolled.  Last A1c was 9.1.  He was started on metformin but did not continue it as he cleaned up his diet and sugars were better.   He also stopped his Lipitor.  Go back on Lipitor 20 mg/d.  Counseled on diet and exercise. PCV20 today. Mind feet as he does have to some decreased sensation.  Flu shot today.  Ck Hep C. Ck TSH.  I think it is probably due to physical deconditioning.  Once he gets more active again, I think this will improve.  I will sign off on a temporary handicap placard while this issue was remedied.  Could consider physical therapy in the future. Refer GI.  F/u in 3 mo. The patient voiced understanding and agreement to the plan.  Spent 45 minutes with the patient discussing above in addition to reviewing his chart on the same day of the visit.  [place], DO 01/06/21 3:44 PM

## 2021-01-06 NOTE — Patient Instructions (Addendum)
Give [place] 2-3 business days to get the results of your labs back.   Keep the diet clean and stay active.  Go back on the atorvastatin.   Go to the [facility] web site, print out a handicap placard form and drop it off for me to fill out.   The new Shingrix vaccine (for shingles) is a 2 shot series. It can make people feel low energy, achy and almost like they have the flu for 48 hours after injection. Please plan accordingly when deciding on when to get this shot. Call your pharmacy to get this. The second shot of the series is less severe regarding the side effects, but it still lasts 48 hours.   Foods that may reduce pain: 1) Ginger 2) Blueberries 3) Salmon 4) Pumpkin seeds 5) dark chocolate 6) turmeric 7) tart cherries 8) virgin olive oil 9) chilli peppers 10) mint 11) red wine  Let us know if you need anything.

## 2021-01-09 ENCOUNTER — Other Ambulatory Visit: Payer: Self-pay | Admitting: Family Medicine

## 2021-01-09 ENCOUNTER — Other Ambulatory Visit (INDEPENDENT_AMBULATORY_CARE_PROVIDER_SITE_OTHER): Payer: Medicare Other

## 2021-01-09 ENCOUNTER — Other Ambulatory Visit: Payer: Self-pay

## 2021-01-09 DIAGNOSIS — E1169 Type 2 diabetes mellitus with other specified complication: Secondary | ICD-10-CM

## 2021-01-09 DIAGNOSIS — Z1159 Encounter for screening for other viral diseases: Secondary | ICD-10-CM

## 2021-01-09 DIAGNOSIS — E119 Type 2 diabetes mellitus without complications: Secondary | ICD-10-CM

## 2021-01-09 DIAGNOSIS — R5383 Other fatigue: Secondary | ICD-10-CM | POA: Diagnosis not present

## 2021-01-09 DIAGNOSIS — E669 Obesity, unspecified: Secondary | ICD-10-CM | POA: Diagnosis not present

## 2021-01-09 DIAGNOSIS — E785 Hyperlipidemia, unspecified: Secondary | ICD-10-CM

## 2021-01-09 LAB — COMPREHENSIVE METABOLIC PANEL
ALT: 20 U/L (ref 0–53)
AST: 14 U/L (ref 0–37)
Albumin: 4.4 g/dL (ref 3.5–5.2)
Alkaline Phosphatase: 43 U/L (ref 39–117)
BUN: 25 mg/dL — ABNORMAL HIGH (ref 6–23)
CO2: 27 mEq/L (ref 19–32)
Calcium: 9 mg/dL (ref 8.4–10.5)
Chloride: 99 mEq/L (ref 96–112)
Creatinine, Ser: 1.11 mg/dL (ref 0.40–1.50)
GFR: 69 mL/min (ref >60.00)
Glucose, Bld: 285 mg/dL — ABNORMAL HIGH (ref 70–99)
Potassium: 4.3 mEq/L (ref 3.5–5.1)
Sodium: 133 mEq/L — ABNORMAL LOW (ref 135–145)
Total Bilirubin: 1.3 mg/dL — ABNORMAL HIGH (ref 0.2–1.2)
Total Protein: 6.5 g/dL (ref 6.0–8.3)

## 2021-01-09 LAB — HEMOGLOBIN A1C: Hgb A1c MFr Bld: 11.5 % — ABNORMAL HIGH (ref 4.6–6.5)

## 2021-01-09 LAB — LIPID PANEL
Cholesterol: 197 mg/dL (ref 0–200)
HDL: 35.2 mg/dL — ABNORMAL LOW (ref >39.00)
NonHDL: 161.59
Total CHOL/HDL Ratio: 6
Triglycerides: 224 mg/dL — ABNORMAL HIGH (ref 0.0–149.0)
VLDL: 44.8 mg/dL — ABNORMAL HIGH (ref 0.0–40.0)

## 2021-01-09 LAB — MICROALBUMIN / CREATININE URINE RATIO
Creatinine,U: 152.1 mg/dL
Microalb Creat Ratio: 13.6 mg/g (ref 0.0–30.0)
Microalb, Ur: 20.7 mg/dL — ABNORMAL HIGH (ref 0.0–1.9)

## 2021-01-09 LAB — CBC
HCT: 41.8 % (ref 39.0–52.0)
Hemoglobin: 13.8 g/dL (ref 13.0–17.0)
MCHC: 33.1 g/dL (ref 30.0–36.0)
MCV: 84.6 fl (ref 78.0–100.0)
Platelets: 156 10*3/uL (ref 150.0–400.0)
RBC: 4.94 Mil/uL (ref 4.22–5.81)
RDW: 13.9 % (ref 11.5–15.5)
WBC: 6.1 10*3/uL (ref 4.0–10.5)

## 2021-01-09 LAB — LDL CHOLESTEROL, DIRECT: Direct LDL: 116 mg/dL

## 2021-01-09 LAB — TSH: TSH: 6.13 u[IU]/mL — ABNORMAL HIGH (ref 0.35–5.50)

## 2021-01-09 MED ORDER — METFORMIN HCL 500 MG PO TABS: ORAL_TABLET | ORAL | 1 refills | Status: DC

## 2021-01-09 NOTE — Progress Notes (Signed)
lipid

## 2021-01-09 NOTE — Addendum Note (Signed)
Addended by: Manuela Schwartz on: 01/09/2021 08:52 AM   Modules accepted: Orders

## 2021-01-09 NOTE — Progress Notes (Signed)
referral

## 2021-01-12 LAB — HEPATITIS C ANTIBODY
Hepatitis C Ab: NONREACTIVE
SIGNAL TO CUT-OFF: 0.04 (ref <1.00)

## 2021-01-19 ENCOUNTER — Other Ambulatory Visit: Payer: Self-pay | Admitting: Family Medicine

## 2021-01-19 DIAGNOSIS — E119 Type 2 diabetes mellitus without complications: Secondary | ICD-10-CM

## 2021-01-19 DIAGNOSIS — Z1211 Encounter for screening for malignant neoplasm of colon: Secondary | ICD-10-CM

## 2021-02-24 ENCOUNTER — Ambulatory Visit (INDEPENDENT_AMBULATORY_CARE_PROVIDER_SITE_OTHER): Payer: Medicare Other | Admitting: Family Medicine

## 2021-02-24 ENCOUNTER — Other Ambulatory Visit: Payer: Self-pay

## 2021-02-24 ENCOUNTER — Encounter: Payer: Self-pay | Admitting: Physical Therapy

## 2021-02-24 ENCOUNTER — Ambulatory Visit: Payer: Medicare Other | Attending: Family Medicine | Admitting: Physical Therapy

## 2021-02-24 VITALS — BP 150/84 | Ht 73.0 in | Wt 253.0 lb

## 2021-02-24 DIAGNOSIS — M222X1 Patellofemoral disorders, right knee: Secondary | ICD-10-CM | POA: Insufficient documentation

## 2021-02-24 DIAGNOSIS — M222X2 Patellofemoral disorders, left knee: Secondary | ICD-10-CM

## 2021-02-24 DIAGNOSIS — S46011D Strain of muscle(s) and tendon(s) of the rotator cuff of right shoulder, subsequent encounter: Secondary | ICD-10-CM

## 2021-02-24 DIAGNOSIS — M25562 Pain in left knee: Secondary | ICD-10-CM | POA: Diagnosis present

## 2021-02-24 DIAGNOSIS — M6281 Muscle weakness (generalized): Secondary | ICD-10-CM | POA: Diagnosis present

## 2021-02-24 DIAGNOSIS — R262 Difficulty in walking, not elsewhere classified: Secondary | ICD-10-CM | POA: Insufficient documentation

## 2021-02-24 DIAGNOSIS — G8929 Other chronic pain: Secondary | ICD-10-CM | POA: Insufficient documentation

## 2021-02-24 DIAGNOSIS — M25561 Pain in right knee: Secondary | ICD-10-CM | POA: Diagnosis present

## 2021-02-24 NOTE — Progress Notes (Signed)
°  Noah Lane - 65 y.o. male MRN [id number]  Date of birth: April 04, 1955  SUBJECTIVE:  Including CC & ROS.  No chief complaint on file.   Noah Lane is a 65 y.o. male that is presenting with acute on chronic right shoulder pain.  He was taking his dog for a walk and they ended up pulling his arm.  Since that time he has had worsening right shoulder pain.  He lacks range of motion and lacks strength.  Has weakness in the upper arm.  Also presenting with bilateral anterior knee pain.   Review of Systems See HPI   HISTORY: Past Medical, Surgical, Social, and Family History Reviewed & Updated per EMR.   Pertinent Historical Findings include:  Past Medical History:  Diagnosis Date   Diabetes mellitus without complication ([place])     Past Surgical History:  Procedure Laterality Date   HERNIA REPAIR  2012    Family History  Problem Relation Age of Onset   Heart disease Mother    Diabetes Father    Cancer Father        post age of 52    Social History   Socioeconomic History   Marital status: Married    Spouse name: Not on file   Number of children: Not on file   Years of education: Not on file   Highest education level: Not on file  Occupational History   Not on file  Tobacco Use   Smoking status: Former   Smokeless tobacco: Never  Vaping Use   Vaping Use: Never used  Substance and Sexual Activity   Alcohol use: Yes    Comment: occasional   Drug use: Never   Sexual activity: Not on file  Other Topics Concern   Not on file  Social History Narrative   Not on file   Social Determinants of Health   Financial Resource Strain: Not on file  Food Insecurity: Not on file  Transportation Needs: Not on file  Physical Activity: Not on file  Stress: Not on file  Social Connections: Not on file  Intimate Partner Violence: Not on file     PHYSICAL EXAM:  VS: BP (!) 150/84    Ht 6\' 1"  (1.854 m)    Wt 253 lb (114.8 kg)    BMI 33.38 kg/m  Physical Exam Gen: NAD,  alert, cooperative with exam, well-appearing    ASSESSMENT & PLAN:   Patellofemoral pain syndrome of both knees Acutely occurring.  No effusion appreciated today. -Counseled on home exercise therapy and supportive care. -Referral to physical therapy. -Could consider further imaging.    Traumatic incomplete tear of right rotator cuff Recent trauma and now subsequent limited range of motion and weakness of the upper arm. -Counseled on home exercise therapy and supportive care. -MRI of the right shoulder to evaluate for traumatic rotator cuff tear.

## 2021-02-24 NOTE — Therapy (Signed)
[facility] @ [place], [place], [address] Phone: [phone]   Fax:  [phone]  Physical Therapy Evaluation  Patient Details  Name: Noah Lane MRN: [id number] Date of Birth: [birth year] Referring Provider (PT): Dr. Clearance Coots   Encounter Date: 02/24/2021   PT End of Session - 02/24/21 1645     Visit Number 1    Date for PT Re-Evaluation 04/21/21    Authorization Type Medicare 10th visit progress note    PT Start Time 1530    PT Stop Time 1615    PT Time Calculation (min) 45 min    Activity Tolerance Patient tolerated treatment well             Past Medical History:  Diagnosis Date   Diabetes mellitus without complication [facility])     Past Surgical History:  Procedure Laterality Date   HERNIA REPAIR  2012    There were no vitals filed for this visit.    Subjective Assessment - 02/24/21 1536     Subjective Broken foot but that 6 months on a knee scooter has really set me back.  Dr. described as "theater knees."  2 years ago I did some mountain hiking.    Pertinent History getting MRI for shoulder;  LBP and hip pain;  DM with neuropathy    Limitations House hold activities;Walking;Standing    How long can you sit comfortably? only if straighten leg out    How long can you walk comfortably? 1/3 mile get tightness in my piriformis    Patient Stated Goals get back to hiking;  trip to [place];  get up from the chair without feeling it; lift the grandkids;  maybe play golf    Currently in Pain? Yes    Pain Score 0-No pain    Pain Location Knee    Pain Orientation Right;Left    Pain Type Chronic pain    Aggravating Factors  after sitting, worse at night; longer walking                [facility] PT Assessment - 02/24/21 0001       Assessment   Medical Diagnosis bil patellofemoral pain    Referring Provider (PT) Dr. Clearance Coots    Next MD Visit will follow up after shoulder MRI    Prior  Therapy for hip/LBP      Precautions   Precautions None      Restrictions   Weight Bearing Restrictions No      Balance Screen   Has the patient fallen in the past 6 months Yes    How many times? when broke foot (pivot around to look at something)   stiff in AM which throughts off balance   Has the patient had a decrease in activity level because of a fear of falling?  No    Is the patient reluctant to leave their home because of a fear of falling?  No      Home Environment   Living Environment Private residence    Living Arrangements Spouse/significant other    Type of [place] Access Level entry    [place] One level      Prior Function   Vocation Self employed    Vocation Requirements real estate    Leisure travel; be with grandchildren      Observation/Other Assessments   Focus on Therapeutic Outcomes (FOTO)  52%  Squat   Comments Able to do a 1/2 squat to retrieve a small object from floor      Sit to Stand   Comments 5x sit to stand test no UE assist 18 sec      Posture/Postural Control   Posture Comments Single leg stance left 6 sec, right <3 sec multiple attempts      AROM   Overall AROM Comments hips grossly WFLs    Right Knee Extension 0    Right Knee Flexion 140    Left Knee Extension 0    Left Knee Flexion 140      Strength   Overall Strength Comments bil ankle plantarflexion 4/5    Right Hip Extension 4-/5    Right Hip ABduction 4-/5    Left Hip Extension 4-/5    Left Hip ABduction 4-/5    Right Knee Flexion 4/5    Right Knee Extension 4/5    Left Knee Flexion 4/5    Left Knee Extension 4/5      Flexibility   Hamstrings left 50, right 60 degrees    Quadriceps hip extension to 5 degrees bil      Ambulation/Gait   Assistive device None    Gait Pattern --   inc foot slap bil (neuropathy);slighter wider stance   Stairs Yes    Stair Management Technique Two rails   pt states at times does one at a time   Number of Stairs 4                         Objective measurements completed on examination: See above findings.                  PT Short Term Goals - 02/24/21 1704       PT SHORT TERM GOAL #1   Title Pt will be I and compliant with initial HEP.    Time 4    Period Weeks    Status New    Target Date 03/24/21      PT SHORT TERM GOAL #2   Title Patient will have increased HS length to 65 degrees bil and hip extension to 10 degrees bil    Time 4    Period Weeks    Status New      PT SHORT TERM GOAL #3   Title The patient will rise sit to stand 5x in 16 sec or less    Time 4    Period Weeks    Status New      PT SHORT TERM GOAL #4   Title Pt will report ability to walk 1/2 mile with pain/discomfort 3/10 or less    Time 4    Period Weeks    Status New               PT Long Term Goals - 02/24/21 1706       PT LONG TERM GOAL #1   Title The patient will be independent in a safe self progression of HEP    Time 8    Period Weeks    Status New    Target Date 04/21/21      PT LONG TERM GOAL #2   Title Pt will report ability to walk dogs for at least 25 minutes at a time or 1 mile with pain 3/10 or less    Time 8    Period Weeks    Status New  PT LONG TERM GOAL #3   Title Bil knee strength improved to 4+/5 and hip strength to 4/5 needed for standing/walking longer periods of time at work and for hiking and lifting grandkids    Time 8    Period Weeks    Status New      PT LONG TERM GOAL #4   Title FOTO score improved from 52% to 65% indicating improved function with less pain    Time 8    Period Weeks    Status New                    Plan - 02/24/21 1646     Clinical Impression Statement The patient reports a decline in strength and mobility after he broke his foot and spent the next 6 months on a knee scooter and walking boot.  He is referred to PT for eval and treatment for patellofemoral pain syndrome of both knees.  He reports  anterior knee discomfort with rising after sitting, at night time and with prolonged walking.  He is limited to walking 1/3 mile comfortably.  He is able to rise from a standard height chair without UE assist.  Gait with a slightly wider base of support and increased slap of feet with walking consistent with neuropathy.  He is able to ascend and descend 4 steps reciprocally with 2 railings but states he does one at a time on occasion.  His knee and hip ROM is WFLs.  Decreased HS lengths bil, mild limitation to bil hip flexor lengths.  Decreased quad strength and HS strength 4/5 and decreased hip abduction and hip extension strength 4-/5, ankle plantarflexion strength 4/5.  Decreased single leg stance time on right < 3 sec, left 6 sec.  He would benefit from PT to address these deficits in order to meet his goals of lifting his grandchildren, return to hiking in the mountains, greater ease with sit to stand and improved mobility for a trip to [place] next December.    Personal Factors and Comorbidities Comorbidity 1;Comorbidity 2    Comorbidities DM with neuropathy;  chronic LBP/hip pain    Examination-Activity Limitations Locomotion Level;Lift;Stairs;Stand;Transfers    Examination-Participation Restrictions Occupation;Interpersonal Relationship;Community Activity    Stability/Clinical Decision Making Stable/Uncomplicated    Clinical Decision Making Low    Rehab Potential Good    PT Frequency 2x / week    PT Duration 8 weeks    PT Treatment/Interventions ADLs/Self Care Home Management;Aquatic Therapy;Cryotherapy;Electrical Stimulation;Iontophoresis 4mg /ml Dexamethasone;Moist Heat;Ultrasound;Neuromuscular re-education;Therapeutic exercise;Therapeutic activities;Patient/family education;Manual techniques;Dry needling;Taping    PT Next Visit Plan start [place] next visit;  Nu-Step;  HS stretching, hip flexor stretching; sit to stand,step ups;  hip abduction and hip extension strengthening; eventual  progression to dead lifting for goal of lifting grandchildren    Consulted and Agree with Plan of Care Patient             Patient will benefit from skilled therapeutic intervention in order to improve the following deficits and impairments:  Difficulty walking, Pain, Decreased activity tolerance, Impaired perceived functional ability, Impaired flexibility, Decreased strength  Visit Diagnosis: Chronic pain of left knee - Plan: PT plan of care cert/re-cert  Chronic pain of right knee - Plan: PT plan of care cert/re-cert  Muscle weakness (generalized) - Plan: PT plan of care cert/re-cert  Difficulty in walking, not elsewhere classified - Plan: PT plan of care cert/re-cert     Problem List Patient Active Problem List   Diagnosis Date Noted  Patellofemoral pain syndrome of both knees 02/24/2021   Traumatic incomplete tear of right rotator cuff 12/28/2019   Hip pain, bilateral 05/28/2019   Chronic bilateral low back pain without sciatica 05/28/2019   Diabetes mellitus type 2 in obese ([place]) 05/28/2019   Erectile dysfunction 05/28/2019   Pain and swelling of left knee 03/12/2019   Lateral epicondylitis, right elbow 03/15/2017   Ruben Im, PT 02/24/21 5:13 PM Phone: [phone] Fax: [phone]  Alvera Singh, PT 02/24/2021, 5:13 PM  [facility] @ [place], [place], [address] Phone: [phone]   Fax:  [phone]  Name: Noah Lane MRN: [id number] Date of Birth: [birth year]

## 2021-02-24 NOTE — Assessment & Plan Note (Signed)
Acutely occurring.  No effusion appreciated today. -Counseled on home exercise therapy and supportive care. -Referral to physical therapy. -Could consider further imaging.

## 2021-02-24 NOTE — Patient Instructions (Signed)
Good to see you Please call [phone] to schedule the MRI  We'll try physical therapy for the knees   Please send me a message in MyChart with any questions or updates.  We'll schedule a virtual visit once the MRI is resulted.   --Dr. Raeford Razor

## 2021-02-24 NOTE — Assessment & Plan Note (Signed)
Recent trauma and now subsequent limited range of motion and weakness of the upper arm. -Counseled on home exercise therapy and supportive care. -MRI of the right shoulder to evaluate for traumatic rotator cuff tear.

## 2021-02-26 ENCOUNTER — Other Ambulatory Visit: Payer: Self-pay

## 2021-02-26 ENCOUNTER — Ambulatory Visit: Payer: Medicare Other

## 2021-02-26 DIAGNOSIS — R262 Difficulty in walking, not elsewhere classified: Secondary | ICD-10-CM

## 2021-02-26 DIAGNOSIS — M6281 Muscle weakness (generalized): Secondary | ICD-10-CM

## 2021-02-26 DIAGNOSIS — M25562 Pain in left knee: Secondary | ICD-10-CM

## 2021-02-26 DIAGNOSIS — G8929 Other chronic pain: Secondary | ICD-10-CM

## 2021-02-26 NOTE — Therapy (Signed)
[facility] @ [place], [place], [address] Phone: [phone]   Fax:  [phone]  Physical Therapy Treatment  Patient Details  Name: Noah Lane MRN: [id number] Date of Birth: Apr 14, 1955 Referring Provider (PT): Dr. Clearance Coots   Encounter Date: 02/26/2021   PT End of Session - 02/26/21 1255     Visit Number 2    Date for PT Re-Evaluation 04/21/21    Authorization Type Medicare 10th visit progress note    PT Start Time 0845    PT Stop Time 0930    PT Time Calculation (min) 45 min    Activity Tolerance Patient tolerated treatment well    Behavior During Therapy [facility] for tasks assessed/performed             Past Medical History:  Diagnosis Date   Diabetes mellitus without complication [facility])     Past Surgical History:  Procedure Laterality Date   HERNIA REPAIR  2012    There were no vitals filed for this visit.   Subjective Assessment - 02/26/21 0847     Subjective Patient states "they're aching this morning, not really painful, just interferes with daily getting up and down and routine activities"    Pertinent History getting MRI for shoulder;  LBP and hip pain;  DM with neuropathy    Limitations House hold activities;Walking;Standing    How long can you sit comfortably? only if straighten leg out    How long can you walk comfortably? 1/3 mile get tightness in my piriformis    Patient Stated Goals get back to hiking;  trip to [place];  get up from the chair without feeling it; lift the grandkids;  maybe play golf    Currently in Pain? Yes    Pain Score 2     Pain Location Knee    Pain Orientation Left;Right    Pain Descriptors / Indicators Aching    Pain Type Chronic pain    Pain Onset More than a month ago    Pain Frequency Intermittent                               [facility] Adult PT Treatment/Exercise - 02/26/21 0001       Exercises   Exercises Knee/Hip       Knee/Hip Exercises: Stretches   Sports administrator Both;3 reps;30 seconds      Knee/Hip Exercises: Aerobic   Nustep seat 15, arms 12, level 5 x 5 min      Knee/Hip Exercises: Machines for Strengthening   Hip Cybex 55 lbs hip extension and abduction x 20 each      Knee/Hip Exercises: Supine   Quad Sets Strengthening;Both;20 reps    Short Arc Quad Sets Strengthening;Both;20 reps    Short Arc Quad Sets Limitations 5 lb ankle weights    Straight Leg Raises Strengthening;Both;20 reps    Straight Leg Raises Limitations insure proper alignment      Knee/Hip Exercises: Sidelying   Hip ABduction Strengthening;Both;1 set;20 reps                     PT Education - 02/26/21 0912     Education Details Access Code: [email or web address]    Person(s) Educated Patient              PT Short Term Goals - 02/24/21 1704       PT SHORT TERM GOAL #1  Title Pt will be I and compliant with initial HEP.    Time 4    Period Weeks    Status New    Target Date 03/24/21      PT SHORT TERM GOAL #2   Title Patient will have increased HS length to 65 degrees bil and hip extension to 10 degrees bil    Time 4    Period Weeks    Status New      PT SHORT TERM GOAL #3   Title The patient will rise sit to stand 5x in 16 sec or less    Time 4    Period Weeks    Status New      PT SHORT TERM GOAL #4   Title Pt will report ability to walk 1/2 mile with pain/discomfort 3/10 or less    Time 4    Period Weeks    Status New               PT Long Term Goals - 02/24/21 1706       PT LONG TERM GOAL #1   Title The patient will be independent in a safe self progression of HEP    Time 8    Period Weeks    Status New    Target Date 04/21/21      PT LONG TERM GOAL #2   Title Pt will report ability to walk dogs for at least 25 minutes at a time or 1 mile with pain 3/10 or less    Time 8    Period Weeks    Status New      PT LONG TERM GOAL #3   Title Bil knee strength improved to 4+/5 and hip  strength to 4/5 needed for standing/walking longer periods of time at work and for hiking and lifting grandkids    Time 8    Period Weeks    Status New      PT LONG TERM GOAL #4   Title FOTO score improved from 52% to 65% indicating improved function with less pain    Time 8    Period Weeks    Status New                   Plan - 02/26/21 1256     Clinical Impression Statement Noah Lane was able to complete all tasks today without pain but he does demonstrate significant hip weakness.  He is tight in hip ER and ambulates with bilateral external rotation.  He would benefit from continuing skilled PT for hip strengthening and quad rehab.    Personal Factors and Comorbidities Comorbidity 1;Comorbidity 2    Comorbidities DM with neuropathy;  chronic LBP/hip pain    Examination-Activity Limitations Locomotion Level;Lift;Stairs;Stand;Transfers    Examination-Participation Restrictions Occupation;Interpersonal Relationship;Community Activity    Stability/Clinical Decision Making Stable/Uncomplicated    Clinical Decision Making Low    Rehab Potential Good    PT Frequency 2x / week    PT Duration 8 weeks    PT Treatment/Interventions ADLs/Self Care Home Management;Aquatic Therapy;Cryotherapy;Electrical Stimulation;Iontophoresis 4mg /ml Dexamethasone;Moist Heat;Ultrasound;Neuromuscular re-education;Therapeutic exercise;Therapeutic activities;Patient/family education;Manual techniques;Dry needling;Taping    PT Next Visit Plan Review HEP ;  Nu-Step;  HS stretching, hip flexor stretching; sit to stand,step ups;  hip abduction and hip extension strengthening; eventual progression to dead lifting for goal of lifting grandchildren    Consulted and Agree with Plan of Care Patient  Patient will benefit from skilled therapeutic intervention in order to improve the following deficits and impairments:  Difficulty walking, Pain, Decreased activity tolerance, Impaired perceived  functional ability, Impaired flexibility, Decreased strength  Visit Diagnosis: Chronic pain of left knee  Chronic pain of right knee  Muscle weakness (generalized)  Difficulty in walking, not elsewhere classified     Problem List Patient Active Problem List   Diagnosis Date Noted   Patellofemoral pain syndrome of both knees 02/24/2021   Traumatic incomplete tear of right rotator cuff 12/28/2019   Hip pain, bilateral 05/28/2019   Chronic bilateral low back pain without sciatica 05/28/2019   Diabetes mellitus type 2 in obese ([place]) 05/28/2019   Erectile dysfunction 05/28/2019   Pain and swelling of left knee 03/12/2019   Lateral epicondylitis, right elbow 03/15/2017    Anderson Malta B. Leiliana Foody, PT 12/29/221:06 PM   [facility] @ [place], [place], [address] Phone: [phone]   Fax:  [phone]  Name: Darald Uzzle MRN: [id number] Date of Birth: [birth year]

## 2021-02-26 NOTE — Patient Instructions (Signed)
Initiated HEP Access Code: [email or web address] URL: [email or web address] Date: 02/26/2021 Prepared by: Candyce Churn  Exercises Standing Quad Stretch with Table and Chair Support - 2 x daily - 7 x weekly - 1 sets - 3 reps - 30 sec hold Long Sitting Quad Set - 2 x daily - 7 x weekly - 1 sets - 20 reps - 2-3 sec hold Supine Knee Extension Strengthening - 2 x daily - 7 x weekly - 1 sets - 20 reps Small Range Straight Leg Raise - 2 x daily - 7 x weekly - 1 sets - 20 reps

## 2021-02-27 ENCOUNTER — Other Ambulatory Visit (INDEPENDENT_AMBULATORY_CARE_PROVIDER_SITE_OTHER): Payer: Medicare Other

## 2021-02-27 DIAGNOSIS — E785 Hyperlipidemia, unspecified: Secondary | ICD-10-CM | POA: Diagnosis not present

## 2021-02-27 LAB — LIPID PANEL
Cholesterol: 122 mg/dL (ref 0–200)
HDL: 34.3 mg/dL — ABNORMAL LOW (ref >39.00)
LDL Cholesterol: 64 mg/dL (ref 0–99)
NonHDL: 87.41
Total CHOL/HDL Ratio: 4
Triglycerides: 118 mg/dL (ref 0.0–149.0)
VLDL: 23.6 mg/dL (ref 0.0–40.0)

## 2021-02-27 LAB — HEPATIC FUNCTION PANEL
ALT: 11 U/L (ref 0–53)
AST: 12 U/L (ref 0–37)
Albumin: 4.2 g/dL (ref 3.5–5.2)
Alkaline Phosphatase: 37 U/L — ABNORMAL LOW (ref 39–117)
Bilirubin, Direct: 0.2 mg/dL (ref 0.0–0.3)
Total Bilirubin: 1.1 mg/dL (ref 0.2–1.2)
Total Protein: 6.2 g/dL (ref 6.0–8.3)

## 2021-03-05 ENCOUNTER — Other Ambulatory Visit: Payer: Self-pay

## 2021-03-05 ENCOUNTER — Ambulatory Visit: Payer: Medicare Other

## 2021-03-05 ENCOUNTER — Ambulatory Visit: Payer: Medicare Other | Attending: Family Medicine | Admitting: Physical Therapy

## 2021-03-05 DIAGNOSIS — M6281 Muscle weakness (generalized): Secondary | ICD-10-CM | POA: Insufficient documentation

## 2021-03-05 DIAGNOSIS — G8929 Other chronic pain: Secondary | ICD-10-CM | POA: Diagnosis present

## 2021-03-05 DIAGNOSIS — M25552 Pain in left hip: Secondary | ICD-10-CM | POA: Diagnosis present

## 2021-03-05 DIAGNOSIS — M25562 Pain in left knee: Secondary | ICD-10-CM | POA: Insufficient documentation

## 2021-03-05 DIAGNOSIS — M25511 Pain in right shoulder: Secondary | ICD-10-CM | POA: Insufficient documentation

## 2021-03-05 DIAGNOSIS — R262 Difficulty in walking, not elsewhere classified: Secondary | ICD-10-CM | POA: Diagnosis present

## 2021-03-05 DIAGNOSIS — M25561 Pain in right knee: Secondary | ICD-10-CM | POA: Insufficient documentation

## 2021-03-05 DIAGNOSIS — M25551 Pain in right hip: Secondary | ICD-10-CM | POA: Diagnosis present

## 2021-03-05 DIAGNOSIS — M7581 Other shoulder lesions, right shoulder: Secondary | ICD-10-CM | POA: Insufficient documentation

## 2021-03-05 DIAGNOSIS — M545 Low back pain, unspecified: Secondary | ICD-10-CM | POA: Diagnosis present

## 2021-03-05 NOTE — Patient Instructions (Signed)
Access Code: [email or web address] URL: [email or web address] Date: 03/05/2021 Prepared by: Ruben Im  Exercises Hip Flexor Stretch on Step - 1 x daily - 7 x weekly - 1 sets - 10 reps Standing Quad Stretch with Table and Chair Support - 2 x daily - 7 x weekly - 1 sets - 3 reps - 30 sec hold Long Sitting Quad Set - 2 x daily - 7 x weekly - 1 sets - 20 reps - 2-3 sec hold Supine Knee Extension Strengthening - 2 x daily - 7 x weekly - 1 sets - 20 reps Small Range Straight Leg Raise - 2 x daily - 7 x weekly - 1 sets - 20 reps Supine Bridge - 1 x daily - 7 x weekly - 1 sets - 10 reps Hooklying Isometric Hip Flexion - 1 x daily - 7 x weekly - 1 sets - 10 reps - 5 hold Sidelying Hip Abduction - 1 x daily - 7 x weekly - 1 sets - 10 reps Sit to Stand Without Arm Support - 1 x daily - 7 x weekly - 1 sets - 10 reps

## 2021-03-05 NOTE — Therapy (Signed)
[facility] @ [address] [place], [place], [address] Phone: [phone]   Fax:  [phone]  Physical Therapy Treatment  Patient Details  Name: Noah Lane MRN: [id number] Date of Birth: 12-20-1955 Referring Provider (PT): Dr. Clearance Coots   Encounter Date: 03/05/2021   PT End of Session - 03/05/21 1214     Visit Number 3    Date for PT Re-Evaluation 04/21/21    Authorization Type Medicare 10th visit progress note    PT Start Time 0801    PT Stop Time 0844    PT Time Calculation (min) 43 min    Activity Tolerance Patient tolerated treatment well             Past Medical History:  Diagnosis Date   Diabetes mellitus without complication [facility])     Past Surgical History:  Procedure Laterality Date   HERNIA REPAIR  2012    There were no vitals filed for this visit.   Subjective Assessment - 03/05/21 0809     Subjective Some of the exercises bothered my hip.  I need another [place] code.  It goes too fast to follow along.    Pertinent History getting MRI for shoulder;  LBP and hip pain;  DM with neuropathy    Patient Stated Goals get back to hiking;  trip to [place];  get up from the chair without feeling it; lift the grandkids;  maybe play golf    Currently in Pain? Yes    Pain Score 2     Pain Location Hip                               [facility] Adult PT Treatment/Exercise - 03/05/21 0001       Knee/Hip Exercises: Stretches   Other Knee/Hip Stretches 2nd step hip flexor stretch 10x right/left      Knee/Hip Exercises: Aerobic   Nustep seat 15, arms 12, level 5 x 10 min while discussing response to ex and plan for progression      Knee/Hip Exercises: Seated   Sit to Sand 10 reps   low mat table plus cushion     Knee/Hip Exercises: Supine   Other Supine Knee/Hip Exercises bridge 20x    Other Supine Knee/Hip Exercises abdominal brace with hand to knee isometric push 5 sec hold 10x       Knee/Hip Exercises: Sidelying   Hip ABduction Strengthening;Both;1 set;20 reps                     PT Education - 03/05/21 1208     Education Details supine bridge; standing hip flexor stretch on 2nd step; abdominal activation with hand to knee isometric push in supine; sidelying hip abduction; sit to stand without UEs    Person(s) Educated Patient    Methods Explanation;Demonstration;Handout    Comprehension Returned demonstration;Verbalized understanding              PT Short Term Goals - 02/24/21 1704       PT SHORT TERM GOAL #1   Title Pt will be I and compliant with initial HEP.    Time 4    Period Weeks    Status New    Target Date 03/24/21      PT SHORT TERM GOAL #2   Title Patient will have increased HS length to 65 degrees bil and hip extension to 10 degrees bil  Time 4    Period Weeks    Status New      PT SHORT TERM GOAL #3   Title The patient will rise sit to stand 5x in 16 sec or less    Time 4    Period Weeks    Status New      PT SHORT TERM GOAL #4   Title Pt will report ability to walk 1/2 mile with pain/discomfort 3/10 or less    Time 4    Period Weeks    Status New               PT Long Term Goals - 02/24/21 1706       PT LONG TERM GOAL #1   Title The patient will be independent in a safe self progression of HEP    Time 8    Period Weeks    Status New    Target Date 04/21/21      PT LONG TERM GOAL #2   Title Pt will report ability to walk dogs for at least 25 minutes at a time or 1 mile with pain 3/10 or less    Time 8    Period Weeks    Status New      PT LONG TERM GOAL #3   Title Bil knee strength improved to 4+/5 and hip strength to 4/5 needed for standing/walking longer periods of time at work and for hiking and lifting grandkids    Time 8    Period Weeks    Status New      PT LONG TERM GOAL #4   Title FOTO score improved from 52% to 65% indicating improved function with less pain    Time 8     Period Weeks    Status New                   Plan - 03/05/21 1208     Clinical Impression Statement Review of initial HEP to determine if any of these bothered his hip and offered alternative method for quad/hip flexor stretching although he would like to continue with both ex's.  Added a progression of core, hip and knee strengthening ex's to HEP without production of hip or knee pain.  Also gave patient ex flow sheet for date check off and ex list to help with self efficacy.  Verbal cues to avoid holding his breath with isometric exercise in supine.  Therapist monitoring response throughout treatment session.    Comorbidities DM with neuropathy;  chronic LBP/hip pain    Examination-Activity Limitations Locomotion Level;Lift;Stairs;Stand;Transfers    Examination-Participation Restrictions Occupation;Interpersonal Relationship;Community Activity    Rehab Potential Good    PT Frequency 2x / week    PT Duration 8 weeks    PT Treatment/Interventions ADLs/Self Care Home Management;Aquatic Therapy;Cryotherapy;Electrical Stimulation;Iontophoresis 4mg /ml Dexamethasone;Moist Heat;Ultrasound;Neuromuscular re-education;Therapeutic exercise;Therapeutic activities;Patient/family education;Manual techniques;Dry needling;Taping    PT Next Visit Plan Review HEP ;  Nu-Step;  HS stretching, hip flexor stretching; sit to stand,step ups;  hip abduction and hip extension strengthening; eventual progression to dead lifting for goal of lifting grandchildren    PT Home Exercise Plan [facility]             Patient will benefit from skilled therapeutic intervention in order to improve the following deficits and impairments:  Difficulty walking, Pain, Decreased activity tolerance, Impaired perceived functional ability, Impaired flexibility, Decreased strength  Visit Diagnosis: Chronic pain of left knee  Chronic pain of right knee  Muscle  weakness (generalized)  Difficulty in walking, not elsewhere  classified     Problem List Patient Active Problem List   Diagnosis Date Noted   Patellofemoral pain syndrome of both knees 02/24/2021   Traumatic incomplete tear of right rotator cuff 12/28/2019   Hip pain, bilateral 05/28/2019   Chronic bilateral low back pain without sciatica 05/28/2019   Diabetes mellitus type 2 in obese ([place]) 05/28/2019   Erectile dysfunction 05/28/2019   Pain and swelling of left knee 03/12/2019   Lateral epicondylitis, right elbow 03/15/2017   Ruben Im, PT 03/05/21 12:20 PM Phone: [phone] Fax: [phone]  Alvera Singh, PT 03/05/2021, 12:20 PM  [facility] @ [place], [place], [address] Phone: [phone]   Fax:  [phone]  Name: Ayomikun Starling MRN: [id number] Date of Birth: [birth year]

## 2021-03-09 ENCOUNTER — Telehealth: Payer: Self-pay | Admitting: Family Medicine

## 2021-03-09 NOTE — Telephone Encounter (Signed)
Left message for patient to call back and schedule Medicare Annual Wellness Visit (AWV) in office.  ° °If not able to come in office, please offer to do virtually or by telephone.  Left office number and my jabber #[phone]. ° °Due for AWVI ° °Please schedule at anytime with Nurse Health Advisor. °  °

## 2021-03-11 ENCOUNTER — Other Ambulatory Visit: Payer: Self-pay

## 2021-03-11 ENCOUNTER — Ambulatory Visit: Payer: Medicare Other | Admitting: Rehabilitative and Restorative Service Providers"

## 2021-03-11 ENCOUNTER — Encounter: Payer: Self-pay | Admitting: Rehabilitative and Restorative Service Providers"

## 2021-03-11 DIAGNOSIS — M6281 Muscle weakness (generalized): Secondary | ICD-10-CM

## 2021-03-11 DIAGNOSIS — M25562 Pain in left knee: Secondary | ICD-10-CM

## 2021-03-11 DIAGNOSIS — R262 Difficulty in walking, not elsewhere classified: Secondary | ICD-10-CM

## 2021-03-11 DIAGNOSIS — G8929 Other chronic pain: Secondary | ICD-10-CM

## 2021-03-11 NOTE — Therapy (Signed)
[facility] @ [address] [place], [place], [address] Phone: [phone]   Fax:  [phone]  Physical Therapy Treatment  Patient Details  Name: Noah Lane MRN: [id number] Date of Birth: 06/14/1955 Referring Provider (PT): Dr. Clearance Coots   Encounter Date: 03/11/2021   PT End of Session - 03/11/21 0807     Visit Number 4    Date for PT Re-Evaluation 04/21/21    Authorization Type Medicare 10th visit progress note    PT Start Time 0800    PT Stop Time 0840    PT Time Calculation (min) 40 min    Activity Tolerance Patient tolerated treatment well    Behavior During Therapy [facility] for tasks assessed/performed             Past Medical History:  Diagnosis Date   Diabetes mellitus without complication [facility])     Past Surgical History:  Procedure Laterality Date   HERNIA REPAIR  2012    There were no vitals filed for this visit.   Subjective Assessment - 03/11/21 0805     Subjective My knees are sore a lot. I am going to be going on a week and a half hike in [place] in December 2024.    Patient Stated Goals get back to hiking;  trip to [place];  get up from the chair without feeling it; lift the grandkids;  maybe play golf    Currently in Pain? Yes    Pain Score 6     Pain Location Knee    Pain Orientation Right;Left    Pain Descriptors / Indicators Discomfort;Sore    Pain Type Chronic pain                               [facility] Adult PT Treatment/Exercise - 03/11/21 0001       Knee/Hip Exercises: Stretches   Passive Hamstring Stretch Both;2 reps;20 seconds    Passive Hamstring Stretch Limitations in standing at steps    Piriformis Stretch Both;2 reps;20 seconds    Piriformis Stretch Limitations in sitting    Other Knee/Hip Stretches 2nd step hip flexor stretch 10x right/left      Knee/Hip Exercises: Aerobic   Nustep seat 15, arms 12, level 5 x 10 min while discussing response  to ex and plan for progression      Knee/Hip Exercises: Machines for Strengthening   Cybex Leg Press 50# 2x10    Hip Cybex 55 lbs hip extension and abduction x 10 each      Knee/Hip Exercises: Seated   Long Arc Quad Strengthening;Both;2 sets;10 reps    Long Arc Quad Weight 3 lbs.                       PT Short Term Goals - 03/11/21 0847       PT SHORT TERM GOAL #1   Title Pt will be I and compliant with initial HEP.    Status Achieved      PT SHORT TERM GOAL #2   Title Patient will have increased HS length to 65 degrees bil and hip extension to 10 degrees bil    Status On-going      PT SHORT TERM GOAL #3   Title The patient will rise sit to stand 5x in 16 sec or less    Status On-going      PT SHORT TERM GOAL #  4   Title Pt will report ability to walk 1/2 mile with pain/discomfort 3/10 or less    Status On-going               PT Long Term Goals - 02/24/21 1706       PT LONG TERM GOAL #1   Title The patient will be independent in a safe self progression of HEP    Time 8    Period Weeks    Status New    Target Date 04/21/21      PT LONG TERM GOAL #2   Title Pt will report ability to walk dogs for at least 25 minutes at a time or 1 mile with pain 3/10 or less    Time 8    Period Weeks    Status New      PT LONG TERM GOAL #3   Title Bil knee strength improved to 4+/5 and hip strength to 4/5 needed for standing/walking longer periods of time at work and for hiking and lifting grandkids    Time 8    Period Weeks    Status New      PT LONG TERM GOAL #4   Title FOTO score improved from 52% to 65% indicating improved function with less pain    Time 8    Period Weeks    Status New                   Plan - 03/11/21 0843     Clinical Impression Statement Mr Gandolfi continues to report compliance with HEP and states they are going well.  Pt tolerated variation of ther ex well and without increased pain.  Pt did state some difficulty with  hip cybex secondary to weakness, so limited to 10 reps. Pt requires cuing with LAQ to hold for TKE at end range to assist with quad strengthening. Pt continues to require skilled PT to progress towards goal related activities.    PT Treatment/Interventions ADLs/Self Care Home Management;Aquatic Therapy;Cryotherapy;Electrical Stimulation;Iontophoresis 4mg /ml Dexamethasone;Moist Heat;Ultrasound;Neuromuscular re-education;Therapeutic exercise;Therapeutic activities;Patient/family education;Manual techniques;Dry needling;Taping    PT Next Visit Plan Review HEP ;  Nu-Step;  HS stretching, hip flexor stretching; sit to stand,step ups;  hip abduction and hip extension strengthening; eventual progression to dead lifting for goal of lifting grandchildren    Consulted and Agree with Plan of Care Patient             Patient will benefit from skilled therapeutic intervention in order to improve the following deficits and impairments:  Difficulty walking, Pain, Decreased activity tolerance, Impaired perceived functional ability, Impaired flexibility, Decreased strength  Visit Diagnosis: Chronic pain of left knee  Chronic pain of right knee  Muscle weakness (generalized)  Difficulty in walking, not elsewhere classified     Problem List Patient Active Problem List   Diagnosis Date Noted   Patellofemoral pain syndrome of both knees 02/24/2021   Traumatic incomplete tear of right rotator cuff 12/28/2019   Hip pain, bilateral 05/28/2019   Chronic bilateral low back pain without sciatica 05/28/2019   Diabetes mellitus type 2 in obese ([place]) 05/28/2019   Erectile dysfunction 05/28/2019   Pain and swelling of left knee 03/12/2019   Lateral epicondylitis, right elbow 03/15/2017    Noah Lane, PT, DPT 03/11/2021, 8:48 AM  [facility] @ [place], [place], [address] Phone: [phone]   Fax:  [phone]  Name: Noah Lane MRN: [id number] Date of  Birth: 02/08/1956

## 2021-03-13 ENCOUNTER — Encounter: Payer: Self-pay | Admitting: Physical Therapy

## 2021-03-13 ENCOUNTER — Other Ambulatory Visit: Payer: Self-pay

## 2021-03-13 ENCOUNTER — Ambulatory Visit: Payer: Medicare Other | Admitting: Physical Therapy

## 2021-03-13 DIAGNOSIS — R262 Difficulty in walking, not elsewhere classified: Secondary | ICD-10-CM

## 2021-03-13 DIAGNOSIS — G8929 Other chronic pain: Secondary | ICD-10-CM

## 2021-03-13 DIAGNOSIS — M25562 Pain in left knee: Secondary | ICD-10-CM | POA: Diagnosis not present

## 2021-03-13 DIAGNOSIS — M6281 Muscle weakness (generalized): Secondary | ICD-10-CM

## 2021-03-13 NOTE — Therapy (Signed)
[place] @ [place] [address], [place], [address] Phone: [phone]   Fax:  [phone]  Physical Therapy Treatment  Patient Details  Name: Noah Lane MRN: [id number] Date of Birth: 03-30-1955 Referring Provider (PT): Dr. Clearance Coots   Encounter Date: 03/13/2021   PT End of Session - 03/13/21 0801     Visit Number 5    Date for PT Re-Evaluation 04/21/21    Authorization Type Medicare 10th visit progress note    PT Start Time 0800    PT Stop Time 0848    PT Time Calculation (min) 48 min    Activity Tolerance Patient tolerated treatment well    Behavior During Therapy [facility] for tasks assessed/performed             Past Medical History:  Diagnosis Date   Diabetes mellitus without complication [facility])     Past Surgical History:  Procedure Laterality Date   HERNIA REPAIR  2012    There were no vitals filed for this visit.   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    Pertinent History getting MRI for shoulder;  LBP and hip pain;  DM with neuropathy    Currently in Pain? No/denies   Aching and tightness, intermittent                              [facility] Adult PT Treatment/Exercise - 03/13/21 0001       Knee/Hip Exercises: Stretches   Passive Hamstring Stretch Both;3 reps;20 seconds    Passive Hamstring Stretch Limitations with strap    Piriformis Stretch Both;2 reps;20 seconds    Piriformis Stretch Limitations in sitting    Other Knee/Hip Stretches 2nd step hip flexor stretch 20 sec      Knee/Hip Exercises: Aerobic   Stationary Bike L1 6 min with PTA      Knee/Hip Exercises: Machines for Strengthening   Cybex Leg Press Bil 55# 2x10    Hip Cybex Bil hip ext 55# 15x Bil      Knee/Hip Exercises: Supine   Bridges with Clamshell Strengthening;Both;1 set;10 reps   red loop                       PT Short Term Goals - 03/11/21 0847       PT SHORT TERM GOAL #1   Title Pt will be I and compliant with initial HEP.    Status Achieved      PT SHORT TERM GOAL #2   Title Patient will have increased HS length to 65 degrees bil and hip extension to 10 degrees bil    Status On-going      PT SHORT TERM GOAL #3   Title The patient will rise sit to stand 5x in 16 sec or less    Status On-going      PT SHORT TERM GOAL #4   Title Pt will report ability to walk 1/2 mile with pain/discomfort 3/10 or less    Status On-going               PT Long Term Goals - 02/24/21 1706       PT LONG TERM GOAL #1   Title The patient will be independent in a safe self progression of HEP  Time 8    Period Weeks    Status New    Target Date 04/21/21      PT LONG TERM GOAL #2   Title Pt will report ability to walk dogs for at least 25 minutes at a time or 1 mile with pain 3/10 or less    Time 8    Period Weeks    Status New      PT LONG TERM GOAL #3   Title Bil knee strength improved to 4+/5 and hip strength to 4/5 needed for standing/walking longer periods of time at work and for hiking and lifting grandkids    Time 8    Period Weeks    Status New      PT LONG TERM GOAL #4   Title FOTO score improved from 52% to 65% indicating improved function with less pain    Time 8    Period Weeks    Status New                   Plan - 03/13/21 4680     Clinical Impression Statement Pt arrives with no pain but some tightness and intermittent aching. Pt tolerates all exercises and their progressions today without any pain. Pt did find performing 15 reps in hip extension vs 10 pretty challenging.    Personal Factors and Comorbidities Comorbidity 1;Comorbidity 2    Comorbidities DM with neuropathy;  chronic LBP/hip pain    Examination-Activity Limitations Locomotion Level;Lift;Stairs;Stand;Transfers    Examination-Participation Restrictions  Occupation;Interpersonal Relationship;Community Activity    Stability/Clinical Decision Making Stable/Uncomplicated    Rehab Potential Good    PT Frequency 2x / week    PT Duration 8 weeks    PT Treatment/Interventions ADLs/Self Care Home Management;Aquatic Therapy;Cryotherapy;Electrical Stimulation;Iontophoresis 4mg /ml Dexamethasone;Moist Heat;Ultrasound;Neuromuscular re-education;Therapeutic exercise;Therapeutic activities;Patient/family education;Manual techniques;Dry needling;Taping    PT Next Visit Plan Review HEP ;  Nu-Step;  HS stretching, hip flexor stretching; sit to stand,step ups;  hip abduction and hip extension strengthening; eventual progression to dead lifting for goal of lifting grandchildren    PT Home Exercise Plan [id number]    Consulted and Agree with Plan of Care Patient             Patient will benefit from skilled therapeutic intervention in order to improve the following deficits and impairments:  Difficulty walking, Pain, Decreased activity tolerance, Impaired perceived functional ability, Impaired flexibility, Decreased strength  Visit Diagnosis: Chronic pain of left knee  Chronic pain of right knee  Muscle weakness (generalized)  Difficulty in walking, not elsewhere classified     Problem List Patient Active Problem List   Diagnosis Date Noted   Patellofemoral pain syndrome of both knees 02/24/2021   Traumatic incomplete tear of right rotator cuff 12/28/2019   Hip pain, bilateral 05/28/2019   Chronic bilateral low back pain without sciatica 05/28/2019   Diabetes mellitus type 2 in obese ([place]) 05/28/2019   Erectile dysfunction 05/28/2019   Pain and swelling of left knee 03/12/2019   Lateral epicondylitis, right elbow 03/15/2017    Noah Lane, PTA 03/13/2021, 8:47 AM  [place] [facility] @ [place], [place], [address] Phone: [phone]   Fax:  [phone]  Name: Noah Lane MRN: [id number] Date of Birth: [birth year]

## 2021-03-15 ENCOUNTER — Other Ambulatory Visit: Payer: Self-pay

## 2021-03-15 ENCOUNTER — Ambulatory Visit
Admission: RE | Admit: 2021-03-15 | Discharge: 2021-03-15 | Disposition: A | Discharge status: Home / Self Care | Payer: Medicare Other | Source: Ambulatory Visit | Attending: Family Medicine | Admitting: Family Medicine

## 2021-03-15 DIAGNOSIS — S46011D Strain of muscle(s) and tendon(s) of the rotator cuff of right shoulder, subsequent encounter: Secondary | ICD-10-CM

## 2021-03-16 ENCOUNTER — Telehealth (INDEPENDENT_AMBULATORY_CARE_PROVIDER_SITE_OTHER): Payer: Medicare Other | Admitting: Family Medicine

## 2021-03-16 ENCOUNTER — Encounter: Payer: Self-pay | Admitting: Family Medicine

## 2021-03-16 DIAGNOSIS — M7581 Other shoulder lesions, right shoulder: Secondary | ICD-10-CM

## 2021-03-16 NOTE — Progress Notes (Signed)
Virtual Visit via Video Note  I connected with Noah Lane on 03/16/21 at  1:10 PM EST by a video enabled telemedicine application and verified that I am speaking with the correct person using two identifiers.  Location: Patient: home Provider: office   I discussed the limitations of evaluation and management by telemedicine and the availability of in person appointments. The patient expressed understanding and agreed to proceed.  History of Present Illness:  Noah Lane is a 66 year old male that is following up after the MRI of his right shoulder.  This was demonstrating midsubstance tears of the infraspinatus as well as a subscapularis tendinosis and degenerative change of the [facility] joint.  Shows mild to moderate glenohumeral degenerative changes.  Observations/Objective:   Assessment and Plan:  Tendinopathy of right rotator cuff:  Rotator cuff was demonstrating degenerative changes with bursal changes.  Mild degenerative changes of the joint. -Counseled on home exercise therapy and supportive care. -Referral to physical therapy. -Pursue shockwave therapy. -Counseled on nitro patches. -Could consider injection.  Follow Up Instructions:    I discussed the assessment and treatment plan with the patient. The patient was provided an opportunity to ask questions and all were answered. The patient agreed with the plan and demonstrated an understanding of the instructions.   The patient was advised to call back or seek an in-person evaluation if the symptoms worsen or if the condition fails to improve as anticipated.    Clearance Coots, MD

## 2021-03-16 NOTE — Assessment & Plan Note (Addendum)
Rotator cuff was demonstrating degenerative changes with bursal changes.  Mild degenerative changes of the joint. -Counseled on home exercise therapy and supportive care. -Referral to physical therapy. -Pursue shockwave therapy. -Counseled on nitro patches. -Could consider injection.

## 2021-03-18 ENCOUNTER — Other Ambulatory Visit: Payer: Self-pay

## 2021-03-18 ENCOUNTER — Encounter: Payer: Self-pay | Admitting: Rehabilitative and Restorative Service Providers"

## 2021-03-18 ENCOUNTER — Ambulatory Visit: Payer: Medicare Other | Admitting: Rehabilitative and Restorative Service Providers"

## 2021-03-18 DIAGNOSIS — M545 Low back pain, unspecified: Secondary | ICD-10-CM

## 2021-03-18 DIAGNOSIS — G8929 Other chronic pain: Secondary | ICD-10-CM

## 2021-03-18 DIAGNOSIS — M25511 Pain in right shoulder: Secondary | ICD-10-CM

## 2021-03-18 DIAGNOSIS — R262 Difficulty in walking, not elsewhere classified: Secondary | ICD-10-CM

## 2021-03-18 DIAGNOSIS — M6281 Muscle weakness (generalized): Secondary | ICD-10-CM

## 2021-03-18 DIAGNOSIS — M25551 Pain in right hip: Secondary | ICD-10-CM

## 2021-03-18 DIAGNOSIS — M25562 Pain in left knee: Secondary | ICD-10-CM | POA: Diagnosis not present

## 2021-03-18 DIAGNOSIS — M25552 Pain in left hip: Secondary | ICD-10-CM

## 2021-03-18 NOTE — Therapy (Signed)
[place] @ [address] [place], [place], [address] Phone: [phone]   Fax:  [phone]  Physical Therapy Treatment and Re-Evaluation  Patient Details  Name: Noah Lane MRN: [id number] Date of Birth: 09/27/1955 Referring Provider (PT): Dr. Clearance Coots   Encounter Date: 03/18/2021   PT End of Session - 03/18/21 0830     Visit Number 6    Date for PT Re-Evaluation 04/24/21    Authorization Type Medicare 10th visit progress note    PT Start Time 0800    PT Stop Time 0845    PT Time Calculation (min) 45 min    Activity Tolerance Patient tolerated treatment well    Behavior During Therapy [facility] for tasks assessed/performed             Past Medical History:  Diagnosis Date   Diabetes mellitus without complication [facility])     Past Surgical History:  Procedure Laterality Date   HERNIA REPAIR  2012    There were no vitals filed for this visit.   Subjective Assessment - 03/18/21 0825     Subjective Pt is into PT clinic today with new orders for R shoulder secondary to rotator cuff tendonitis.    Pertinent History getting MRI for shoulder;  LBP and hip pain;  DM with neuropathy    Patient Stated Goals get back to hiking;  trip to [place];  get up from the chair without feeling it; lift the grandkids;  maybe play golf    Currently in Pain? Yes    Pain Score 7     Pain Location Shoulder    Pain Orientation Right    Pain Descriptors / Indicators Sharp    Pain Type Chronic pain    Pain Onset More than a month ago    Pain Frequency Intermittent    Aggravating Factors  certain movements                [facility] PT Assessment - 03/18/21 0001       Assessment   Medical Diagnosis New diagnosis of M75.81 (ICD-10-CM) - Rotator cuff tendinitis, right. bil patellofemoral pain    Referring Provider (PT) Dr. Clearance Coots    Prior Therapy for hip/LBP      Precautions   Precautions None      Restrictions    Weight Bearing Restrictions No      Balance Screen   Has the patient fallen in the past 6 months No    Has the patient had a decrease in activity level because of a fear of falling?  No    Is the patient reluctant to leave their home because of a fear of falling?  No      Home Environment   Living Environment Private residence    Living Arrangements Spouse/significant other    Type of Ruth Access Level entry    Home Layout One level      Prior Function   Vocation Self employed    Vocation Requirements real estate    Leisure travel; be with grandchildren      Cognition   Overall Cognitive Status Within Functional Limits for tasks assessed      Observation/Other Assessments   Focus on Therapeutic Outcomes (FOTO)  knee 31%.  Shoulder: 43%      ROM / Strength   AROM / PROM / Strength AROM;Strength      AROM   AROM Assessment Site Shoulder  Right/Left Shoulder Right    Right Shoulder Extension 37 Degrees    Right Shoulder Flexion 128 Degrees    Right Shoulder ABduction 91 Degrees      Strength   Overall Strength Comments R shoulder strength of grossly 4/5 throughout                           [facility] Adult PT Treatment/Exercise - 03/18/21 0001       Exercises   Exercises Shoulder;Knee/Hip      Knee/Hip Exercises: Aerobic   Nustep seat 15, arms 12, level 5 x 10 min while discussing response to ex and plan for progression      Shoulder Exercises: Standing   Extension Strengthening;Both;10 reps    Theraband Level (Shoulder Extension) Level 2 (Red)    Row Strengthening;Both;10 reps    Theraband Level (Shoulder Row) Level 2 (Red)                       PT Short Term Goals - 03/18/21 0910       PT SHORT TERM GOAL #1   Title Pt will be I and compliant with initial HEP.    Status Achieved      PT SHORT TERM GOAL #2   Title Patient will have increased HS length to 65 degrees bil and hip extension to 10 degrees bil     Status On-going      PT SHORT TERM GOAL #3   Title The patient will rise sit to stand 5x in 16 sec or less    Status On-going      PT SHORT TERM GOAL #4   Title Pt will report ability to walk 1/2 mile with pain/discomfort 3/10 or less    Status On-going               PT Long Term Goals - 03/18/21 0912       PT LONG TERM GOAL #1   Title The patient will be independent in a safe self progression of HEP    Status On-going      PT LONG TERM GOAL #2   Title Pt will report ability to walk dogs for at least 25 minutes at a time or 1 mile with pain 3/10 or less    Status On-going      PT LONG TERM GOAL #3   Title Bil knee and shoulder strength improved to 4+/5 and hip strength to 4/5 needed for standing/walking longer periods of time at work and for hiking and lifting grandkids    Status New      PT LONG TERM GOAL #4   Title FOTO score of knees to increase to 65% and shoulder to at least 64% indicating improved function with less pain.    Time 6    Period Weeks    Status New      PT LONG TERM GOAL #5   Title R shoulder flexion and abduction to increase to at least 130 degrees to allow pt to reach into overhead cabinets.    Time 6    Period Weeks    Status New                   Plan - 03/18/21 0900     Clinical Impression Statement Pt with new orders from Dr Raeford Razor for R shoulder secondary to rotator cuff tendonitis.  Pt presents with increased R shoulder pain, R  shoulder weakness, and decreased R shoulder A/ROM. Pt with shoulder FOTO of 43% and reports that he is having difficulty performing activities in and around his home. Pt continues with deficits noted from patellofemoral pain syndrome, as well, and will continue with PT for both shoulder and knee impairments.  Pt would like to return to his PLOF of being able to hike and travel and walk his dogs without pain.  Pt would benefit from skilled PT to address his functional impairments to allow him to return to  painfree lifestyle.    Personal Factors and Comorbidities Comorbidity 2    Comorbidities DM with neuropathy;  chronic LBP/hip pain    Examination-Activity Limitations Locomotion Level;Lift;Stairs;Stand;Transfers    Examination-Participation Restrictions Occupation;Interpersonal Relationship;Community Activity    Stability/Clinical Decision Making Evolving/Moderate complexity    Clinical Decision Making Moderate    Rehab Potential Good    PT Frequency 2x / week    PT Duration 6 weeks    PT Treatment/Interventions ADLs/Self Care Home Management;Aquatic Therapy;Cryotherapy;Electrical Stimulation;Iontophoresis 4mg /ml Dexamethasone;Moist Heat;Ultrasound;Neuromuscular re-education;Therapeutic exercise;Therapeutic activities;Patient/family education;Manual techniques;Dry needling;Taping    PT Next Visit Plan assess and progress HEP, strengthening, core stability, scapular stability    PT Home Exercise Plan [id number]    Consulted and Agree with Plan of Care Patient             Patient will benefit from skilled therapeutic intervention in order to improve the following deficits and impairments:  Difficulty walking, Pain, Decreased activity tolerance, Impaired perceived functional ability, Impaired flexibility, Decreased strength, Decreased range of motion, Postural dysfunction  Visit Diagnosis: Chronic pain of left knee - Plan: PT plan of care cert/re-cert  Chronic pain of right knee - Plan: PT plan of care cert/re-cert  Muscle weakness (generalized) - Plan: PT plan of care cert/re-cert  Difficulty in walking, not elsewhere classified - Plan: PT plan of care cert/re-cert  Acute bilateral low back pain without sciatica - Plan: PT plan of care cert/re-cert  Pain in right hip - Plan: PT plan of care cert/re-cert  Pain in left hip - Plan: PT plan of care cert/re-cert  Chronic right shoulder pain - Plan: PT plan of care cert/re-cert     Problem List Patient Active Problem List    Diagnosis Date Noted   Patellofemoral pain syndrome of both knees 02/24/2021   Rotator cuff tendinitis, right 12/28/2019   Hip pain, bilateral 05/28/2019   Chronic bilateral low back pain without sciatica 05/28/2019   Diabetes mellitus type 2 in obese ([place]) 05/28/2019   Erectile dysfunction 05/28/2019   Pain and swelling of left knee 03/12/2019   Lateral epicondylitis, right elbow 03/15/2017    Juel Burrow, PT, DPT 03/18/2021, 9:21 AM  [place] @ [address] [place], [place], [address] Phone: [phone]   Fax:  [phone]  Name: Noah Lane MRN: [id number] Date of Birth: 1955-03-12

## 2021-03-20 ENCOUNTER — Ambulatory Visit: Payer: Medicare Other | Admitting: Physical Therapy

## 2021-03-20 ENCOUNTER — Other Ambulatory Visit: Payer: Self-pay

## 2021-03-20 ENCOUNTER — Encounter: Payer: Self-pay | Admitting: Physical Therapy

## 2021-03-20 DIAGNOSIS — M25511 Pain in right shoulder: Secondary | ICD-10-CM

## 2021-03-20 DIAGNOSIS — M25551 Pain in right hip: Secondary | ICD-10-CM

## 2021-03-20 DIAGNOSIS — G8929 Other chronic pain: Secondary | ICD-10-CM

## 2021-03-20 DIAGNOSIS — R262 Difficulty in walking, not elsewhere classified: Secondary | ICD-10-CM

## 2021-03-20 DIAGNOSIS — M25562 Pain in left knee: Secondary | ICD-10-CM | POA: Diagnosis not present

## 2021-03-20 DIAGNOSIS — M25552 Pain in left hip: Secondary | ICD-10-CM

## 2021-03-20 DIAGNOSIS — M545 Low back pain, unspecified: Secondary | ICD-10-CM

## 2021-03-20 DIAGNOSIS — M6281 Muscle weakness (generalized): Secondary | ICD-10-CM

## 2021-03-20 NOTE — Therapy (Signed)
[place] @ [place] [place], [place], [address] Phone: [phone]   Fax:  [phone]  Physical Therapy Treatment  Patient Details  Name: Noah Lane MRN: [id number] Date of Birth: 18-Sep-1955 Referring Provider (PT): Dr. Clearance Coots   Encounter Date: 03/20/2021   PT End of Session - 03/20/21 0830     Visit Number 7    Date for PT Re-Evaluation 04/24/21    Authorization Type Medicare 10th visit progress note    PT Start Time 0804    PT Stop Time 0850    PT Time Calculation (min) 46 min    Activity Tolerance Patient tolerated treatment well    Behavior During Therapy [facility] for tasks assessed/performed             Past Medical History:  Diagnosis Date   Diabetes mellitus without complication [facility])     Past Surgical History:  Procedure Laterality Date   HERNIA REPAIR  2012    There were no vitals filed for this visit.   Subjective Assessment - 03/20/21 0831     Subjective No new complaints this AM    Pertinent History getting MRI for shoulder;  LBP and hip pain;  DM with neuropathy    Currently in Pain? No/denies                               [facility] Adult PT Treatment/Exercise - 03/20/21 0001       Knee/Hip Exercises: Aerobic   Nustep L3 10 min Both UE/LE PTA present to discuss current status      Knee/Hip Exercises: Machines for Strengthening   Cybex Leg Press Seat 9 Bil 60# 2x10    Hip Cybex Bil hip ext 55# 15x Bil      Knee/Hip Exercises: Seated   Sit to Sand --   2 pads on mat table: 10# KB 2x10     Shoulder Exercises: Standing   Extension Strengthening;Both;20 reps;Theraband    Theraband Level (Shoulder Extension) Level 3 (Green)    Row Strengthening;Both;20 reps;Theraband    Theraband Level (Shoulder Row) Level 3 (Green)    Other Standing Exercises LAt pull 30# 2x10                       PT Short Term Goals - 03/18/21 0910       PT SHORT TERM  GOAL #1   Title Pt will be I and compliant with initial HEP.    Status Achieved      PT SHORT TERM GOAL #2   Title Patient will have increased HS length to 65 degrees bil and hip extension to 10 degrees bil    Status On-going      PT SHORT TERM GOAL #3   Title The patient will rise sit to stand 5x in 16 sec or less    Status On-going      PT SHORT TERM GOAL #4   Title Pt will report ability to walk 1/2 mile with pain/discomfort 3/10 or less    Status On-going               PT Long Term Goals - 03/18/21 0912       PT LONG TERM GOAL #1   Title The patient will be independent in a safe self progression of HEP    Status On-going      PT LONG TERM  GOAL #2   Title Pt will report ability to walk dogs for at least 25 minutes at a time or 1 mile with pain 3/10 or less    Status On-going      PT LONG TERM GOAL #3   Title Bil knee and shoulder strength improved to 4+/5 and hip strength to 4/5 needed for standing/walking longer periods of time at work and for hiking and lifting grandkids    Status New      PT LONG TERM GOAL #4   Title FOTO score of knees to increase to 65% and shoulder to at least 64% indicating improved function with less pain.    Time 6    Period Weeks    Status New      PT LONG TERM GOAL #5   Title R shoulder flexion and abduction to increase to at least 130 degrees to allow pt to reach into overhead cabinets.    Time 6    Period Weeks    Status New                   Plan - 03/20/21 8366     Clinical Impression Statement Pt arrives with no new complaints or knee/shoulder pain. Hips are sore (muscularly) from exercising. Increased tband resistance today, added lat pull down in standing, added resistance to sit to stand and added resisatnce to leg press. pt tolerated all progressions very well.    Personal Factors and Comorbidities Comorbidity 2    Comorbidities DM with neuropathy;  chronic LBP/hip pain    Examination-Activity Limitations  Locomotion Level;Lift;Stairs;Stand;Transfers    Examination-Participation Restrictions Occupation;Interpersonal Relationship;Community Activity    Stability/Clinical Decision Making Evolving/Moderate complexity    Rehab Potential Good    PT Frequency 2x / week    PT Duration 6 weeks    PT Treatment/Interventions ADLs/Self Care Home Management;Aquatic Therapy;Cryotherapy;Electrical Stimulation;Iontophoresis 4mg /ml Dexamethasone;Moist Heat;Ultrasound;Neuromuscular re-education;Therapeutic exercise;Therapeutic activities;Patient/family education;Manual techniques;Dry needling;Taping    PT Next Visit Plan assess and progress HEP, strengthening, core stability, scapular stability    PT Home Exercise Plan [id number]    Consulted and Agree with Plan of Care Patient             Patient will benefit from skilled therapeutic intervention in order to improve the following deficits and impairments:  Difficulty walking, Pain, Decreased activity tolerance, Impaired perceived functional ability, Impaired flexibility, Decreased strength, Decreased range of motion, Postural dysfunction  Visit Diagnosis: Chronic pain of left knee  Chronic pain of right knee  Muscle weakness (generalized)  Difficulty in walking, not elsewhere classified  Acute bilateral low back pain without sciatica  Pain in right hip  Pain in left hip  Chronic right shoulder pain     Problem List Patient Active Problem List   Diagnosis Date Noted   Patellofemoral pain syndrome of both knees 02/24/2021   Rotator cuff tendinitis, right 12/28/2019   Hip pain, bilateral 05/28/2019   Chronic bilateral low back pain without sciatica 05/28/2019   Diabetes mellitus type 2 in obese ([place]) 05/28/2019   Erectile dysfunction 05/28/2019   Pain and swelling of left knee 03/12/2019   Lateral epicondylitis, right elbow 03/15/2017    Naileah Karg, PTA 03/20/2021, 8:54 AM  [place] @  [place], [place], [address] Phone: [phone]   Fax:  [phone]  Name: Taison Celani MRN: [id number] Date of Birth: [birth year]

## 2021-03-24 ENCOUNTER — Encounter: Payer: Self-pay | Admitting: Family Medicine

## 2021-03-24 ENCOUNTER — Other Ambulatory Visit: Payer: Self-pay | Admitting: Family Medicine

## 2021-03-24 DIAGNOSIS — N529 Male erectile dysfunction, unspecified: Secondary | ICD-10-CM

## 2021-03-25 ENCOUNTER — Ambulatory Visit: Payer: Medicare Other | Admitting: Rehabilitative and Restorative Service Providers"

## 2021-03-25 ENCOUNTER — Encounter: Payer: Self-pay | Admitting: Rehabilitative and Restorative Service Providers"

## 2021-03-25 ENCOUNTER — Encounter: Payer: Self-pay | Admitting: Family Medicine

## 2021-03-25 ENCOUNTER — Other Ambulatory Visit: Payer: Self-pay

## 2021-03-25 DIAGNOSIS — M25552 Pain in left hip: Secondary | ICD-10-CM

## 2021-03-25 DIAGNOSIS — G8929 Other chronic pain: Secondary | ICD-10-CM

## 2021-03-25 DIAGNOSIS — R262 Difficulty in walking, not elsewhere classified: Secondary | ICD-10-CM

## 2021-03-25 DIAGNOSIS — M545 Low back pain, unspecified: Secondary | ICD-10-CM

## 2021-03-25 DIAGNOSIS — M25562 Pain in left knee: Secondary | ICD-10-CM | POA: Diagnosis not present

## 2021-03-25 DIAGNOSIS — M25511 Pain in right shoulder: Secondary | ICD-10-CM

## 2021-03-25 DIAGNOSIS — M25561 Pain in right knee: Secondary | ICD-10-CM

## 2021-03-25 DIAGNOSIS — M25551 Pain in right hip: Secondary | ICD-10-CM

## 2021-03-25 DIAGNOSIS — M6281 Muscle weakness (generalized): Secondary | ICD-10-CM

## 2021-03-25 NOTE — Therapy (Signed)
[place] @ [address], [place], [address] Phone: [phone]   Fax:  [phone]  Physical Therapy Treatment  Patient Details  Name: Noah Lane MRN: [id number] Date of Birth: [birth year] Referring Provider (PT): Dr. Clearance Coots   Encounter Date: 03/25/2021   PT End of Session - 03/25/21 0810     Visit Number 8    Date for PT Re-Evaluation 04/24/21    Authorization Type Medicare 10th visit progress note    PT Start Time 0805    PT Stop Time 0845    PT Time Calculation (min) 40 min    Activity Tolerance Patient tolerated treatment well    Behavior During Therapy [facility] for tasks assessed/performed             Past Medical History:  Diagnosis Date   Diabetes mellitus without complication [facility])     Past Surgical History:  Procedure Laterality Date   HERNIA REPAIR  2012    There were no vitals filed for this visit.   Subjective Assessment - 03/25/21 0808     Subjective Yesterday, in the middle of the day, I walked 3/4 of a mile.  I'm a little achy.    Pertinent History getting MRI for shoulder;  LBP and hip pain;  DM with neuropathy    Patient Stated Goals get back to hiking;  trip to [place];  get up from the chair without feeling it; lift the grandkids;  maybe play golf    Currently in Pain? Yes    Pain Score 2     Pain Location Ankle    Pain Orientation Right    Pain Descriptors / Indicators Aching    Pain Type Chronic pain                               [facility] Adult PT Treatment/Exercise - 03/25/21 0001       Knee/Hip Exercises: Aerobic   Nustep seat 15, arms 12, level 5 x 10 min while discussing response to ex and plan for progression   progressed to level 6 halfway through.     Knee/Hip Exercises: Machines for Strengthening   Cybex Leg Press Seat 9 Bil 60# 2x10    Hip Cybex Bil hip ext 55# 15x Bil      Knee/Hip Exercises: Standing   Hip Flexion  Stengthening;Both;1 set;10 reps;Knee straight    Hip Flexion Limitations green tband    Hip Abduction Stengthening;Both;1 set;10 reps;Knee straight    Abduction Limitations green tband      Knee/Hip Exercises: Seated   Sit to Sand 2 sets;10 reps   sitting on one blue foam pad.  Holding 10# kettlebell.     Shoulder Exercises: Standing   Extension Strengthening;Both;20 reps;Theraband    Theraband Level (Shoulder Extension) Level 3 (Green)    Row Strengthening;Both;20 reps;Theraband    Theraband Level (Shoulder Row) Level 3 (Green)      Shoulder Exercises: ROM/Strengthening   Lat Pull 20 reps    Lat Pull Limitations 30#    Wall Pushups 20 reps    Other ROM/Strengthening Exercises Tricep pulldown 20# 2x10                       PT Short Term Goals - 03/25/21 6606       PT SHORT TERM GOAL #4   Title Pt will report ability to walk 1/2 mile  with pain/discomfort 3/10 or less    Status Achieved   Pt reports able to ambulate 3/4 a mile with only minimal ankle pain.              PT Long Term Goals - 03/18/21 0912       PT LONG TERM GOAL #1   Title The patient will be independent in a safe self progression of HEP    Status On-going      PT LONG TERM GOAL #2   Title Pt will report ability to walk dogs for at least 25 minutes at a time or 1 mile with pain 3/10 or less    Status On-going      PT LONG TERM GOAL #3   Title Bil knee and shoulder strength improved to 4+/5 and hip strength to 4/5 needed for standing/walking longer periods of time at work and for hiking and lifting grandkids    Status New      PT LONG TERM GOAL #4   Title FOTO score of knees to increase to 65% and shoulder to at least 64% indicating improved function with less pain.    Time 6    Period Weeks    Status New      PT LONG TERM GOAL #5   Title R shoulder flexion and abduction to increase to at least 130 degrees to allow pt to reach into overhead cabinets.    Time 6    Period Weeks     Status New                   Plan - 03/25/21 0851     Clinical Impression Statement Mr Noah Lane continues to progress and was able to ambulate increased distance yesterday for his walking HEP. He tolerated increased ther ex well and did not report increased pain throughout. Pt cued for improved core stability and lumbar posture with sit to stand, and following cuing, able to have improved technique. Pt continues to require skilled PT to progress towards goal related activities.    Personal Factors and Comorbidities Comorbidity 2    Comorbidities DM with neuropathy;  chronic LBP/hip pain    PT Treatment/Interventions ADLs/Self Care Home Management;Aquatic Therapy;Cryotherapy;Electrical Stimulation;Iontophoresis 4mg /ml Dexamethasone;Moist Heat;Ultrasound;Neuromuscular re-education;Therapeutic exercise;Therapeutic activities;Patient/family education;Manual techniques;Dry needling;Taping    PT Next Visit Plan assess and progress HEP, strengthening, core stability, scapular stability    Consulted and Agree with Plan of Care Patient             Patient will benefit from skilled therapeutic intervention in order to improve the following deficits and impairments:  Difficulty walking, Pain, Decreased activity tolerance, Impaired perceived functional ability, Impaired flexibility, Decreased strength, Decreased range of motion, Postural dysfunction  Visit Diagnosis: Chronic pain of left knee  Chronic pain of right knee  Muscle weakness (generalized)  Difficulty in walking, not elsewhere classified  Acute bilateral low back pain without sciatica  Pain in right hip  Pain in left hip  Chronic right shoulder pain     Problem List Patient Active Problem List   Diagnosis Date Noted   Patellofemoral pain syndrome of both knees 02/24/2021   Rotator cuff tendinitis, right 12/28/2019   Hip pain, bilateral 05/28/2019   Chronic bilateral low back pain without sciatica 05/28/2019    Diabetes mellitus type 2 in obese ([place]) 05/28/2019   Erectile dysfunction 05/28/2019   Pain and swelling of left knee 03/12/2019   Lateral epicondylitis, right elbow 03/15/2017    Juel Burrow,  PT, DPT 03/25/2021, 9:39 AM  [place] @ [place], [place], [address] Phone: [phone]   Fax:  [phone]  Name: Noah Lane MRN: [id number] Date of Birth: 19-Aug-1955

## 2021-03-27 ENCOUNTER — Other Ambulatory Visit: Payer: Self-pay

## 2021-03-27 ENCOUNTER — Ambulatory Visit: Payer: Medicare Other | Admitting: Physical Therapy

## 2021-03-27 ENCOUNTER — Encounter: Payer: Self-pay | Admitting: Physical Therapy

## 2021-03-27 DIAGNOSIS — M25552 Pain in left hip: Secondary | ICD-10-CM

## 2021-03-27 DIAGNOSIS — M545 Low back pain, unspecified: Secondary | ICD-10-CM

## 2021-03-27 DIAGNOSIS — M25511 Pain in right shoulder: Secondary | ICD-10-CM

## 2021-03-27 DIAGNOSIS — M6281 Muscle weakness (generalized): Secondary | ICD-10-CM

## 2021-03-27 DIAGNOSIS — R262 Difficulty in walking, not elsewhere classified: Secondary | ICD-10-CM

## 2021-03-27 DIAGNOSIS — G8929 Other chronic pain: Secondary | ICD-10-CM

## 2021-03-27 DIAGNOSIS — M25551 Pain in right hip: Secondary | ICD-10-CM

## 2021-03-27 DIAGNOSIS — M25562 Pain in left knee: Secondary | ICD-10-CM | POA: Diagnosis not present

## 2021-03-27 NOTE — Therapy (Signed)
[facility] @ [place], [place], [address] Phone: [phone]   Fax:  [phone]  Physical Therapy Treatment  Patient Details  Name: Noah Lane MRN: [id number] Date of Birth: 05-22-1955 Referring Provider (PT): Dr. Clearance Coots   Encounter Date: 03/27/2021   PT End of Session - 03/27/21 0812     Visit Number 9    Date for PT Re-Evaluation 04/24/21    Authorization Type Medicare 10th visit progress note    PT Start Time 0806    PT Stop Time 0847    PT Time Calculation (min) 41 min    Activity Tolerance Patient tolerated treatment well    Behavior During Therapy [facility] for tasks assessed/performed             Past Medical History:  Diagnosis Date   Diabetes mellitus without complication [facility])     Past Surgical History:  Procedure Laterality Date   HERNIA REPAIR  2012    There were no vitals filed for this visit.   Subjective Assessment - 03/27/21 0810     Subjective Walked yesterday, went to my gym and did a little work, had some sciatic symptoms in my RT hamstrings last night: ice pack helped.    Pertinent History getting MRI for shoulder;  LBP and hip pain;  DM with neuropathy                               [facility] Adult PT Treatment/Exercise - 03/27/21 0001       Knee/Hip Exercises: Stretches   Active Hamstring Stretch Both;3 reps;30 seconds    Piriformis Stretch Right;1 rep;30 seconds      Knee/Hip Exercises: Aerobic   Nustep L6 10 min with PTA present to discuss status      Knee/Hip Exercises: Machines for Strengthening   Cybex Leg Press Seat 9 65# 3x10 Bil    Hip Cybex Bil hip ext 55# 20x Bil      Knee/Hip Exercises: Seated   Sit to Sand 2 sets;10 reps;without UE support   no pad, just sitting on mat table with 10# KB                      PT Short Term Goals - 03/25/21 0937       PT SHORT TERM GOAL #4   Title Pt will report ability to walk  1/2 mile with pain/discomfort 3/10 or less    Status Achieved   Pt reports able to ambulate 3/4 a mile with only minimal ankle pain.              PT Long Term Goals - 03/18/21 0912       PT LONG TERM GOAL #1   Title The patient will be independent in a safe self progression of HEP    Status On-going      PT LONG TERM GOAL #2   Title Pt will report ability to walk dogs for at least 25 minutes at a time or 1 mile with pain 3/10 or less    Status On-going      PT LONG TERM GOAL #3   Title Bil knee and shoulder strength improved to 4+/5 and hip strength to 4/5 needed for standing/walking longer periods of time at work and for hiking and lifting grandkids    Status New      PT LONG TERM GOAL #  4   Title FOTO score of knees to increase to 65% and shoulder to at least 64% indicating improved function with less pain.    Time 6    Period Weeks    Status New      PT LONG TERM GOAL #5   Title R shoulder flexion and abduction to increase to at least 130 degrees to allow pt to reach into overhead cabinets.    Time 6    Period Weeks    Status New                   Plan - 03/27/21 9449     Clinical Impression Statement Pt arrives with no current complaints. He reports walking and exercising in his gym yesterday with some mild sciatic symptoms in his Rt hamstring in the evening. Pt reports this resolved with an ice pack. PTA encouraged pt to do his stretches more often as he begins to walk more.    Personal Factors and Comorbidities Comorbidity 2    Comorbidities DM with neuropathy;  chronic LBP/hip pain    Examination-Activity Limitations Locomotion Level;Lift;Stairs;Stand;Transfers    Examination-Participation Restrictions Occupation;Interpersonal Relationship;Community Activity    PT Frequency 2x / week    PT Duration 6 weeks    PT Treatment/Interventions ADLs/Self Care Home Management;Aquatic Therapy;Cryotherapy;Electrical Stimulation;Iontophoresis 4mg /ml  Dexamethasone;Moist Heat;Ultrasound;Neuromuscular re-education;Therapeutic exercise;Therapeutic activities;Patient/family education;Manual techniques;Dry needling;Taping    PT Next Visit Plan assess and progress HEP, strengthening, core stability, scapular stability    PT Home Exercise Plan [id number]    Consulted and Agree with Plan of Care Patient             Patient will benefit from skilled therapeutic intervention in order to improve the following deficits and impairments:  Difficulty walking, Pain, Decreased activity tolerance, Impaired perceived functional ability, Impaired flexibility, Decreased strength, Decreased range of motion, Postural dysfunction  Visit Diagnosis: Chronic pain of left knee  Chronic pain of right knee  Muscle weakness (generalized)  Difficulty in walking, not elsewhere classified  Acute bilateral low back pain without sciatica  Pain in right hip  Pain in left hip  Chronic right shoulder pain     Problem List Patient Active Problem List   Diagnosis Date Noted   Patellofemoral pain syndrome of both knees 02/24/2021   Rotator cuff tendinitis, right 12/28/2019   Hip pain, bilateral 05/28/2019   Chronic bilateral low back pain without sciatica 05/28/2019   Diabetes mellitus type 2 in obese ([place]) 05/28/2019   Erectile dysfunction 05/28/2019   Pain and swelling of left knee 03/12/2019   Lateral epicondylitis, right elbow 03/15/2017    Moesha Sarchet, PTA 03/27/2021, 9:14 AM  [facility] @ [place], [place], [address] Phone: [phone]   Fax:  [phone]  Name: Noah Lane MRN: [id number] Date of Birth: May 26, 1955

## 2021-04-01 ENCOUNTER — Ambulatory Visit: Payer: Medicare Other | Attending: Family Medicine | Admitting: Rehabilitative and Restorative Service Providers"

## 2021-04-01 ENCOUNTER — Other Ambulatory Visit: Payer: Self-pay

## 2021-04-01 ENCOUNTER — Encounter: Payer: Self-pay | Admitting: Rehabilitative and Restorative Service Providers"

## 2021-04-01 DIAGNOSIS — M6281 Muscle weakness (generalized): Secondary | ICD-10-CM | POA: Diagnosis present

## 2021-04-01 DIAGNOSIS — M25551 Pain in right hip: Secondary | ICD-10-CM | POA: Diagnosis present

## 2021-04-01 DIAGNOSIS — M545 Low back pain, unspecified: Secondary | ICD-10-CM | POA: Diagnosis present

## 2021-04-01 DIAGNOSIS — M25552 Pain in left hip: Secondary | ICD-10-CM | POA: Diagnosis present

## 2021-04-01 DIAGNOSIS — M25511 Pain in right shoulder: Secondary | ICD-10-CM | POA: Insufficient documentation

## 2021-04-01 DIAGNOSIS — G8929 Other chronic pain: Secondary | ICD-10-CM | POA: Insufficient documentation

## 2021-04-01 DIAGNOSIS — M25561 Pain in right knee: Secondary | ICD-10-CM | POA: Diagnosis present

## 2021-04-01 DIAGNOSIS — R262 Difficulty in walking, not elsewhere classified: Secondary | ICD-10-CM | POA: Insufficient documentation

## 2021-04-01 DIAGNOSIS — M25562 Pain in left knee: Secondary | ICD-10-CM | POA: Diagnosis not present

## 2021-04-01 NOTE — Therapy (Signed)
[facility] @ [place], [place], [address] Phone: [phone]   Fax:  [phone]  Physical Therapy Treatment  Patient Details  Name: Noah Lane MRN: [id number] Date of Birth: 10-18-1955 Referring Provider (PT): Dr. Clearance Coots   Encounter Date: 04/01/2021   PT End of Session - 04/01/21 0829     Visit Number 10    Date for PT Re-Evaluation 04/24/21    Authorization Type Medicare 10th visit progress note    PT Start Time 0800    PT Stop Time 0840    PT Time Calculation (min) 40 min    Activity Tolerance Patient tolerated treatment well    Behavior During Therapy [facility] for tasks assessed/performed             Past Medical History:  Diagnosis Date   Diabetes mellitus without complication [facility])     Past Surgical History:  Procedure Laterality Date   HERNIA REPAIR  2012    There were no vitals filed for this visit.   Subjective Assessment - 04/01/21 0830     Subjective Pt reports that he did 15 min on his bike this morning.    Pertinent History getting MRI for shoulder;  LBP and hip pain;  DM with neuropathy    Patient Stated Goals get back to hiking;  trip to [place];  get up from the chair without feeling it; lift the grandkids;  maybe play golf    Currently in Pain? No/denies                [facility] PT Assessment - 04/01/21 0001       Flexibility   Hamstrings Right 70 degrees, left 65 degrees                           [facility] Adult PT Treatment/Exercise - 04/01/21 0001       Transfers   Five time sit to stand comments  13.3 sec without UE use required      Knee/Hip Exercises: Stretches   Passive Hamstring Stretch Both;2 reps;20 seconds    Passive Hamstring Stretch Limitations in supine with strap    Piriformis Stretch Both;2 reps;20 seconds    Piriformis Stretch Limitations in hooklying      Knee/Hip Exercises: Machines for Strengthening   Cybex Leg Press Seat 9  65# 3x10 Bil      Shoulder Exercises: Standing   Horizontal ABduction Strengthening;Both;20 reps    Theraband Level (Shoulder Horizontal ABduction) Level 3 (Green)    External Rotation Strengthening;Both;20 reps    Theraband Level (Shoulder External Rotation) Level 3 (Green)    Extension Strengthening;Both;20 reps;Theraband    Theraband Level (Shoulder Extension) Level 3 (Green)    Row Strengthening;Both;20 reps;Theraband    Theraband Level (Shoulder Row) Level 3 (Green)    Other Standing Exercises modified dead lift 5# 2x10    Other Standing Exercises Snatch and press 5# x10 B                       PT Short Term Goals - 04/01/21 0858       PT SHORT TERM GOAL #1   Title Pt will be I and compliant with initial HEP.    Status Achieved      PT SHORT TERM GOAL #2   Title Patient will have increased HS length to 65 degrees bil and hip extension to 10 degrees bil  Status Achieved      PT SHORT TERM GOAL #3   Title The patient will rise sit to stand 5x in 16 sec or less    Status Achieved      PT SHORT TERM GOAL #4   Title Pt will report ability to walk 1/2 mile with pain/discomfort 3/10 or less    Status Achieved               PT Long Term Goals - 03/18/21 0912       PT LONG TERM GOAL #1   Title The patient will be independent in a safe self progression of HEP    Status On-going      PT LONG TERM GOAL #2   Title Pt will report ability to walk dogs for at least 25 minutes at a time or 1 mile with pain 3/10 or less    Status On-going      PT LONG TERM GOAL #3   Title Bil knee and shoulder strength improved to 4+/5 and hip strength to 4/5 needed for standing/walking longer periods of time at work and for hiking and lifting grandkids    Status New      PT LONG TERM GOAL #4   Title FOTO score of knees to increase to 65% and shoulder to at least 64% indicating improved function with less pain.    Time 6    Period Weeks    Status New      PT LONG TERM  GOAL #5   Title R shoulder flexion and abduction to increase to at least 130 degrees to allow pt to reach into overhead cabinets.    Time 6    Period Weeks    Status New                   Plan - 04/01/21 0855     Clinical Impression Statement Noah Lane continues to progress towards goal related activities.  Pt arrived to PT having already completed cardio warm-up, so we could focus more on alternating between UE and LE exercises during session.  Incorporated some total body exercises of snatch and press and modified dead lift with 5#. Pt has increased on his hamstring length since starting PT.  Pt continues to require skilled PT to progress towards goal related activities.    Personal Factors and Comorbidities Comorbidity 2    Comorbidities DM with neuropathy;  chronic LBP/hip pain    PT Treatment/Interventions ADLs/Self Care Home Management;Aquatic Therapy;Cryotherapy;Electrical Stimulation;Iontophoresis 4mg /ml Dexamethasone;Moist Heat;Ultrasound;Neuromuscular re-education;Therapeutic exercise;Therapeutic activities;Patient/family education;Manual techniques;Dry needling;Taping    PT Next Visit Plan assess and progress HEP, strengthening, core stability, scapular stability    Consulted and Agree with Plan of Care Patient             Patient will benefit from skilled therapeutic intervention in order to improve the following deficits and impairments:  Difficulty walking, Pain, Decreased activity tolerance, Impaired perceived functional ability, Impaired flexibility, Decreased strength, Decreased range of motion, Postural dysfunction  Visit Diagnosis: Chronic pain of left knee  Chronic pain of right knee  Muscle weakness (generalized)  Difficulty in walking, not elsewhere classified  Acute bilateral low back pain without sciatica  Pain in right hip  Pain in left hip  Chronic right shoulder pain     Problem List Patient Active Problem List   Diagnosis Date  Noted   Patellofemoral pain syndrome of both knees 02/24/2021   Rotator cuff tendinitis, right 12/28/2019   Hip  pain, bilateral 05/28/2019   Chronic bilateral low back pain without sciatica 05/28/2019   Diabetes mellitus type 2 in obese ([place]) 05/28/2019   Erectile dysfunction 05/28/2019   Pain and swelling of left knee 03/12/2019   Lateral epicondylitis, right elbow 03/15/2017    Juel Burrow, PT, DPT 04/01/2021, 9:00 AM  [facility] @ [place], [place], [address] Phone: [phone]   Fax:  [phone]  Name: Noah Lane MRN: [id number] Date of Birth: 1955/06/26

## 2021-04-02 ENCOUNTER — Telehealth: Payer: Self-pay | Admitting: Family Medicine

## 2021-04-02 NOTE — Telephone Encounter (Signed)
Marie contacted office from [place] sleep [facility] regarding patient.  Lelan Pons is currently working with the patient for oral sleep appliance for sleep apnea.  Ov notes are needed within a year time span of 05/03/2012-05/03/2013 stating if the patient showed any signs regarding sleep apnea. [phone] option 3

## 2021-04-03 ENCOUNTER — Encounter: Payer: Self-pay | Admitting: Physical Therapy

## 2021-04-03 ENCOUNTER — Other Ambulatory Visit: Payer: Self-pay

## 2021-04-03 ENCOUNTER — Ambulatory Visit: Payer: Medicare Other | Admitting: Physical Therapy

## 2021-04-03 DIAGNOSIS — M25562 Pain in left knee: Secondary | ICD-10-CM | POA: Diagnosis not present

## 2021-04-03 DIAGNOSIS — G8929 Other chronic pain: Secondary | ICD-10-CM

## 2021-04-03 DIAGNOSIS — R262 Difficulty in walking, not elsewhere classified: Secondary | ICD-10-CM

## 2021-04-03 DIAGNOSIS — M545 Low back pain, unspecified: Secondary | ICD-10-CM

## 2021-04-03 DIAGNOSIS — M6281 Muscle weakness (generalized): Secondary | ICD-10-CM

## 2021-04-03 NOTE — Telephone Encounter (Signed)
Called the patient and he stated the company has already received  the records they are needing. The patient is scheduled next week 04/10/21 (followup/labs).and would like to discuss with PCP getting his Annual Wellness visit scheduled.

## 2021-04-03 NOTE — Telephone Encounter (Signed)
Is essentially what I informed him of and can do so when he is in the office on 04/10/21.

## 2021-04-03 NOTE — Telephone Encounter (Signed)
Called the patient left message to call back,

## 2021-04-03 NOTE — Therapy (Signed)
[facility] @ [place], [place], [address] Phone: [phone]   Fax:  [phone]  Physical Therapy Treatment  Patient Details  Name: Noah Lane MRN: [id number] Date of Birth: [birth year] Referring Provider (PT): Dr. Clearance Coots   Encounter Date: 04/03/2021   PT End of Session - 04/03/21 0803     Visit Number 11    Date for PT Re-Evaluation 04/24/21    Authorization Type Medicare 10th visit progress note    PT Start Time 0800   Pt had to leave at 8:30   PT Stop Time 0830    PT Time Calculation (min) 30 min    Activity Tolerance Patient tolerated treatment well    Behavior During Therapy [facility] for tasks assessed/performed             Past Medical History:  Diagnosis Date   Diabetes mellitus without complication [facility])     Past Surgical History:  Procedure Laterality Date   HERNIA REPAIR  2012    There were no vitals filed for this visit.   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    Currently in Pain? No/denies    Multiple Pain Sites No                               [facility] Adult PT Treatment/Exercise - 04/03/21 0001       Knee/Hip Exercises: Stretches   Active Hamstring Stretch Both;1 rep;30 seconds    Active Hamstring Stretch Limitations On stairs    Hip Flexor Stretch Both;1 rep;60 seconds    Hip Flexor Stretch Limitations On stairs      Knee/Hip Exercises: Machines for Strengthening   Cybex Leg Press Seat 9 BilLE 70# 3x10      Shoulder Exercises: Standing   Horizontal ABduction Strengthening;Both;Theraband   2x15   Theraband Level (Shoulder Horizontal ABduction) Level 3 (Green)    External Rotation Strengthening;Both;Theraband   2x15   Theraband Level (Shoulder External Rotation) Level 3 (Green)    Other Standing Exercises modified dead lift from stool: 10# 2x10   VC  for full ROM   Other  Standing Exercises Snatch and press 5# 6x2 Bil                       PT Short Term Goals - 04/01/21 0858       PT SHORT TERM GOAL #1   Title Pt will be I and compliant with initial HEP.    Status Achieved      PT SHORT TERM GOAL #2   Title Patient will have increased HS length to 65 degrees bil and hip extension to 10 degrees bil    Status Achieved      PT SHORT TERM GOAL #3   Title The patient will rise sit to stand 5x in 16 sec or less    Status Achieved      PT SHORT TERM GOAL #4   Title Pt will report ability to walk 1/2 mile with pain/discomfort 3/10 or less    Status Achieved               PT Long Term Goals - 03/18/21 0912       PT LONG TERM GOAL #1   Title The patient will be independent in a safe  self progression of HEP    Status On-going      PT LONG TERM GOAL #2   Title Pt will report ability to walk dogs for at least 25 minutes at a time or 1 mile with pain 3/10 or less    Status On-going      PT LONG TERM GOAL #3   Title Bil knee and shoulder strength improved to 4+/5 and hip strength to 4/5 needed for standing/walking longer periods of time at work and for hiking and lifting grandkids    Status New      PT LONG TERM GOAL #4   Title FOTO score of knees to increase to 65% and shoulder to at least 64% indicating improved function with less pain.    Time 6    Period Weeks    Status New      PT LONG TERM GOAL #5   Title R shoulder flexion and abduction to increase to at least 130 degrees to allow pt to reach into overhead cabinets.    Time 6    Period Weeks    Status New                   Plan - 04/03/21 2800     Clinical Impression Statement Pt arrives pain free to PT this AM. Pt reports walkig this AM with less knee pain. Small increases to resisatnce loads todya which pt tolerated well. Minor verbal cues for spine position during dead lifts. Pt had to leave early which shortened his session.    Personal Factors and  Comorbidities Comorbidity 2    Comorbidities DM with neuropathy;  chronic LBP/hip pain    Examination-Activity Limitations Locomotion Level;Lift;Stairs;Stand;Transfers    Examination-Participation Restrictions Occupation;Interpersonal Relationship;Community Activity    Stability/Clinical Decision Making Evolving/Moderate complexity    Rehab Potential Good    PT Frequency 2x / week    PT Duration 6 weeks    PT Treatment/Interventions ADLs/Self Care Home Management;Aquatic Therapy;Cryotherapy;Electrical Stimulation;Iontophoresis 4mg /ml Dexamethasone;Moist Heat;Ultrasound;Neuromuscular re-education;Therapeutic exercise;Therapeutic activities;Patient/family education;Manual techniques;Dry needling;Taping    PT Next Visit Plan assess and progress HEP, strengthening, core stability, scapular stability    PT Home Exercise Plan [id number]    Consulted and Agree with Plan of Care Patient             Patient will benefit from skilled therapeutic intervention in order to improve the following deficits and impairments:  Difficulty walking, Pain, Decreased activity tolerance, Impaired perceived functional ability, Impaired flexibility, Decreased strength, Decreased range of motion, Postural dysfunction  Visit Diagnosis: Chronic pain of left knee  Chronic pain of right knee  Muscle weakness (generalized)  Difficulty in walking, not elsewhere classified  Acute bilateral low back pain without sciatica     Problem List Patient Active Problem List   Diagnosis Date Noted   Patellofemoral pain syndrome of both knees 02/24/2021   Rotator cuff tendinitis, right 12/28/2019   Hip pain, bilateral 05/28/2019   Chronic bilateral low back pain without sciatica 05/28/2019   Diabetes mellitus type 2 in obese ([place]) 05/28/2019   Erectile dysfunction 05/28/2019   Pain and swelling of left knee 03/12/2019   Lateral epicondylitis, right elbow 03/15/2017    Ariyonna Twichell, PTA 04/03/2021, 8:31 AM  [facility] @ [place], [place], [address] Phone: [phone]   Fax:  [phone]  Name: Lyn Deemer MRN: [id number] Date of Birth: [birth year]

## 2021-04-03 NOTE — Telephone Encounter (Signed)
Patient returned cma call. Please advise

## 2021-04-06 ENCOUNTER — Encounter: Payer: Self-pay | Admitting: Family Medicine

## 2021-04-06 ENCOUNTER — Other Ambulatory Visit: Payer: Self-pay | Admitting: Family Medicine

## 2021-04-06 DIAGNOSIS — N529 Male erectile dysfunction, unspecified: Secondary | ICD-10-CM

## 2021-04-07 ENCOUNTER — Encounter: Payer: Self-pay | Admitting: Rehabilitative and Restorative Service Providers"

## 2021-04-07 ENCOUNTER — Ambulatory Visit: Payer: Medicare Other | Admitting: Rehabilitative and Restorative Service Providers"

## 2021-04-07 ENCOUNTER — Other Ambulatory Visit: Payer: Self-pay

## 2021-04-07 DIAGNOSIS — M25562 Pain in left knee: Secondary | ICD-10-CM | POA: Diagnosis not present

## 2021-04-07 DIAGNOSIS — M545 Low back pain, unspecified: Secondary | ICD-10-CM

## 2021-04-07 DIAGNOSIS — R262 Difficulty in walking, not elsewhere classified: Secondary | ICD-10-CM

## 2021-04-07 DIAGNOSIS — M25552 Pain in left hip: Secondary | ICD-10-CM

## 2021-04-07 DIAGNOSIS — M6281 Muscle weakness (generalized): Secondary | ICD-10-CM

## 2021-04-07 DIAGNOSIS — M25551 Pain in right hip: Secondary | ICD-10-CM

## 2021-04-07 DIAGNOSIS — G8929 Other chronic pain: Secondary | ICD-10-CM

## 2021-04-07 NOTE — Therapy (Signed)
[facility] @ [place], [place], [address] Phone: [phone]   Fax:  [phone]  Physical Therapy Treatment  Patient Details  Name: Noah Lane MRN: [id number] Date of Birth: 1955-10-25 Referring Provider (PT): Dr. Clearance Coots   Encounter Date: 04/07/2021   PT End of Session - 04/07/21 0848     Visit Number 12    Date for PT Re-Evaluation 04/24/21    Authorization Type Medicare 20th visit progress note    PT Start Time 0800    PT Stop Time 0840    PT Time Calculation (min) 40 min    Activity Tolerance Patient tolerated treatment well    Behavior During Therapy [facility] for tasks assessed/performed             Past Medical History:  Diagnosis Date   Diabetes mellitus without complication [facility])     Past Surgical History:  Procedure Laterality Date   HERNIA REPAIR  2012    There were no vitals filed for this visit.   Subjective Assessment - 04/07/21 0849     Subjective Pt reports that he has been noticing that he does not have as much pain at night.    Pertinent History getting MRI for shoulder;  LBP and hip pain;  DM with neuropathy    Patient Stated Goals get back to hiking;  trip to [place];  get up from the chair without feeling it; lift the grandkids;  maybe play golf    Currently in Pain? No/denies                               [facility] Adult PT Treatment/Exercise - 04/07/21 0001       Knee/Hip Exercises: Stretches   Active Hamstring Stretch Both;2 reps;20 seconds    Active Hamstring Stretch Limitations On stairs    Hip Flexor Stretch Both    Hip Flexor Stretch Limitations on step with unilateral reach x10 B      Knee/Hip Exercises: Aerobic   Nustep L6 x6 min with PT present to discuss progress.   337 steps     Knee/Hip Exercises: Machines for Strengthening   Cybex Leg Press Seat 9 BilLE 70# 3x10      Shoulder Exercises: Standing   Horizontal ABduction  Strengthening;Both;20 reps;Theraband    Theraband Level (Shoulder Horizontal ABduction) Level 3 (Green)    External Rotation Strengthening;Both;20 reps    Theraband Level (Shoulder External Rotation) Level 3 (Green)    Other Standing Exercises modified dead lift 6# in each hand 2x10    Other Standing Exercises Snatch and press 6# 2x10 Bil      Shoulder Exercises: ROM/Strengthening   Lat Pull 20 reps    Lat Pull Limitations 30#    Cybex Row 20 reps    Cybex Row Limitations 25#    Wall Pushups 20 reps    Other ROM/Strengthening Exercises Tricep pulldown 25# 2x10                       PT Short Term Goals - 04/01/21 0858       PT SHORT TERM GOAL #1   Title Pt will be I and compliant with initial HEP.    Status Achieved      PT SHORT TERM GOAL #2   Title Patient will have increased HS length to 65 degrees bil and hip extension to 10 degrees bil  Status Achieved      PT SHORT TERM GOAL #3   Title The patient will rise sit to stand 5x in 16 sec or less    Status Achieved      PT SHORT TERM GOAL #4   Title Pt will report ability to walk 1/2 mile with pain/discomfort 3/10 or less    Status Achieved               PT Long Term Goals - 04/07/21 0850       PT LONG TERM GOAL #1   Title The patient will be independent in a safe self progression of HEP    Status On-going      PT LONG TERM GOAL #2   Title Pt will report ability to walk dogs for at least 25 minutes at a time or 1 mile with pain 3/10 or less    Status On-going                    Patient will benefit from skilled therapeutic intervention in order to improve the following deficits and impairments:     Visit Diagnosis: Chronic pain of left knee  Chronic pain of right knee  Muscle weakness (generalized)  Difficulty in walking, not elsewhere classified  Acute bilateral low back pain without sciatica  Pain in right hip  Pain in left hip  Chronic right shoulder  pain     Problem List Patient Active Problem List   Diagnosis Date Noted   Patellofemoral pain syndrome of both knees 02/24/2021   Rotator cuff tendinitis, right 12/28/2019   Hip pain, bilateral 05/28/2019   Chronic bilateral low back pain without sciatica 05/28/2019   Diabetes mellitus type 2 in obese ([place]) 05/28/2019   Erectile dysfunction 05/28/2019   Pain and swelling of left knee 03/12/2019   Lateral epicondylitis, right elbow 03/15/2017    Juel Burrow, PT, DPT 04/07/2021, 8:52 AM  [facility] @ [place], [place], [address] Phone: [phone]   Fax:  [phone]  Name: Noah Lane MRN: [id number] Date of Birth: 1955-07-21

## 2021-04-09 ENCOUNTER — Other Ambulatory Visit: Payer: Self-pay

## 2021-04-09 ENCOUNTER — Encounter: Payer: Self-pay | Admitting: Rehabilitative and Restorative Service Providers"

## 2021-04-09 ENCOUNTER — Ambulatory Visit: Payer: Medicare Other | Admitting: Rehabilitative and Restorative Service Providers"

## 2021-04-09 DIAGNOSIS — M25552 Pain in left hip: Secondary | ICD-10-CM

## 2021-04-09 DIAGNOSIS — M25562 Pain in left knee: Secondary | ICD-10-CM

## 2021-04-09 DIAGNOSIS — G8929 Other chronic pain: Secondary | ICD-10-CM

## 2021-04-09 DIAGNOSIS — R262 Difficulty in walking, not elsewhere classified: Secondary | ICD-10-CM

## 2021-04-09 DIAGNOSIS — M6281 Muscle weakness (generalized): Secondary | ICD-10-CM

## 2021-04-09 DIAGNOSIS — M545 Low back pain, unspecified: Secondary | ICD-10-CM

## 2021-04-09 DIAGNOSIS — M25551 Pain in right hip: Secondary | ICD-10-CM

## 2021-04-09 NOTE — Therapy (Signed)
[address] @ [place] [address], [place], [address] Phone: [phone]   Fax:  [phone]  Physical Therapy Treatment  Patient Details  Name: Noah Lane MRN: [id number] Date of Birth: April 07, 1955 Referring Provider (PT): Dr. Clearance Coots   Encounter Date: 04/09/2021   PT End of Session - 04/09/21 0804     Visit Number 13    Date for PT Re-Evaluation 04/24/21    Authorization Type Medicare 20th visit progress note    PT Start Time 0800    PT Stop Time 0840    PT Time Calculation (min) 40 min    Activity Tolerance Patient tolerated treatment well    Behavior During Therapy [facility] for tasks assessed/performed             Past Medical History:  Diagnosis Date   Diabetes mellitus without complication [facility])     Past Surgical History:  Procedure Laterality Date   HERNIA REPAIR  2012    There were no vitals filed for this visit.   Subjective Assessment - 04/09/21 0806     Subjective Pt reports no new complaints.  Pt states that he has been doing his nitropatches more consistantly with his shoulder.    Patient Stated Goals get back to hiking;  trip to [place];  get up from the chair without feeling it; lift the grandkids;  maybe play golf    Pain Score 1     Pain Location Shoulder    Pain Orientation Right    Pain Descriptors / Indicators Dull    Pain Type Chronic pain                               [facility] Adult PT Treatment/Exercise - 04/09/21 0001       Knee/Hip Exercises: Stretches   Active Hamstring Stretch Both;2 reps;20 seconds    Active Hamstring Stretch Limitations On stairs    Hip Flexor Stretch Both    Hip Flexor Stretch Limitations on step with unilateral reach x10 B    Piriformis Stretch Both;2 reps;20 seconds    Piriformis Stretch Limitations in sitting      Knee/Hip Exercises: Machines for Strengthening   Cybex Leg Press Seat 9 BilLE 75# 3x10    Hip Cybex Bil hip ext  55# 20x Bil      Knee/Hip Exercises: Seated   Sit to Sand 2 sets;10 reps;without UE support   holding 10# kettlebell x10 with chest press, x10 with overhead press.     Shoulder Exercises: ROM/Strengthening   Lat Pull 20 reps    Lat Pull Limitations 30#    Cybex Row 20 reps    Cybex Row Limitations 25#    Wall Pushups 20 reps    Other ROM/Strengthening Exercises Tricep pulldown 25# 2x10                       PT Short Term Goals - 04/01/21 0858       PT SHORT TERM GOAL #1   Title Pt will be I and compliant with initial HEP.    Status Achieved      PT SHORT TERM GOAL #2   Title Patient will have increased HS length to 65 degrees bil and hip extension to 10 degrees bil    Status Achieved      PT SHORT TERM GOAL #3   Title The patient will rise sit  to stand 5x in 16 sec or less    Status Achieved      PT SHORT TERM GOAL #4   Title Pt will report ability to walk 1/2 mile with pain/discomfort 3/10 or less    Status Achieved               PT Long Term Goals - 04/09/21 0909       PT LONG TERM GOAL #1   Title The patient will be independent in a safe self progression of HEP    Status On-going      PT LONG TERM GOAL #2   Title Pt will report ability to walk dogs for at least 25 minutes at a time or 1 mile with pain 3/10 or less    Status On-going      PT LONG TERM GOAL #3   Title Bil knee and shoulder strength improved to 4+/5 and hip strength to 4/5 needed for standing/walking longer periods of time at work and for hiking and lifting grandkids    Status On-going      PT LONG TERM GOAL #4   Title FOTO score of knees to increase to 65% and shoulder to at least 64% indicating improved function with less pain.    Status On-going      PT LONG TERM GOAL #5   Title R shoulder flexion and abduction to increase to at least 130 degrees to allow pt to reach into overhead cabinets.    Status On-going                   Plan - 04/09/21 0902      Clinical Impression Statement [name] is progressing towards goal related activities and was able to tolerate weight machines without increased pain.  Pt continues to improve with posture and core stability, requiring less cuing. Pt has increased activity tolerance and does not require as frequent recovery periods.  Pt continues to require skilled PT to progress towards decreased pain with functional activities.    Personal Factors and Comorbidities Comorbidity 2    Comorbidities DM with neuropathy;  chronic LBP/hip pain    PT Treatment/Interventions ADLs/Self Care Home Management;Aquatic Therapy;Cryotherapy;Electrical Stimulation;Iontophoresis 4mg /ml Dexamethasone;Moist Heat;Ultrasound;Neuromuscular re-education;Therapeutic exercise;Therapeutic activities;Patient/family education;Manual techniques;Dry needling;Taping    PT Next Visit Plan assess and progress HEP, strengthening, core stability, scapular stability    PT Home Exercise Plan [id number]    Consulted and Agree with Plan of Care Patient             Patient will benefit from skilled therapeutic intervention in order to improve the following deficits and impairments:  Difficulty walking, Pain, Decreased activity tolerance, Impaired perceived functional ability, Impaired flexibility, Decreased strength, Decreased range of motion, Postural dysfunction  Visit Diagnosis: Chronic pain of left knee  Chronic pain of right knee  Muscle weakness (generalized)  Difficulty in walking, not elsewhere classified  Acute bilateral low back pain without sciatica  Pain in right hip  Pain in left hip  Chronic right shoulder pain     Problem List Patient Active Problem List   Diagnosis Date Noted   Patellofemoral pain syndrome of both knees 02/24/2021   Rotator cuff tendinitis, right 12/28/2019   Hip pain, bilateral 05/28/2019   Chronic bilateral low back pain without sciatica 05/28/2019   Diabetes mellitus type 2 in obese ([place])  05/28/2019   Erectile dysfunction 05/28/2019   Pain and swelling of left knee 03/12/2019   Lateral epicondylitis, right elbow 03/15/2017  Juel Burrow, PT,  DPT 04/09/2021, 9:12 AM  [facility] @ [place], [place], [address] Phone: [phone]   Fax:  [phone]  Name: Noah Lane MRN: [id number] Date of Birth: December 02, 1955

## 2021-04-10 ENCOUNTER — Ambulatory Visit (INDEPENDENT_AMBULATORY_CARE_PROVIDER_SITE_OTHER): Payer: Medicare Other | Admitting: Family Medicine

## 2021-04-10 ENCOUNTER — Encounter: Payer: Self-pay | Admitting: Family Medicine

## 2021-04-10 VITALS — BP 120/71 | HR 54 | Temp 98.6°F | Ht 73.0 in | Wt 246.1 lb

## 2021-04-10 DIAGNOSIS — E782 Mixed hyperlipidemia: Secondary | ICD-10-CM | POA: Diagnosis not present

## 2021-04-10 DIAGNOSIS — E669 Obesity, unspecified: Secondary | ICD-10-CM | POA: Diagnosis not present

## 2021-04-10 DIAGNOSIS — E1169 Type 2 diabetes mellitus with other specified complication: Secondary | ICD-10-CM | POA: Diagnosis not present

## 2021-04-10 LAB — COMPREHENSIVE METABOLIC PANEL
ALT: 13 U/L (ref 0–53)
AST: 14 U/L (ref 0–37)
Albumin: 4.4 g/dL (ref 3.5–5.2)
Alkaline Phosphatase: 42 U/L (ref 39–117)
BUN: 19 mg/dL (ref 6–23)
CO2: 30 mEq/L (ref 19–32)
Calcium: 9.4 mg/dL (ref 8.4–10.5)
Chloride: 102 mEq/L (ref 96–112)
Creatinine, Ser: 1.17 mg/dL (ref 0.40–1.50)
GFR: 64.66 mL/min (ref >60.00)
Glucose, Bld: 113 mg/dL — ABNORMAL HIGH (ref 70–99)
Potassium: 4.8 mEq/L (ref 3.5–5.1)
Sodium: 138 mEq/L (ref 135–145)
Total Bilirubin: 1.5 mg/dL — ABNORMAL HIGH (ref 0.2–1.2)
Total Protein: 6.6 g/dL (ref 6.0–8.3)

## 2021-04-10 LAB — LIPID PANEL
Cholesterol: 118 mg/dL (ref 0–200)
HDL: 42 mg/dL (ref >39.00)
LDL Cholesterol: 59 mg/dL (ref 0–99)
NonHDL: 75.69
Total CHOL/HDL Ratio: 3
Triglycerides: 83 mg/dL (ref 0.0–149.0)
VLDL: 16.6 mg/dL (ref 0.0–40.0)

## 2021-04-10 LAB — HEMOGLOBIN A1C: Hgb A1c MFr Bld: 7.3 % — ABNORMAL HIGH (ref 4.6–6.5)

## 2021-04-10 NOTE — Progress Notes (Signed)
Subjective:   Chief Complaint  Patient presents with   Follow-up    3 month    Noah Lane is a 66 y.o. male here for follow-up of diabetes.   [name]'s self monitored glucose range is low 100's.  Patient denies hypoglycemic reactions. He has a CGM.  Patient does not require insulin.   Medications include: Metformin 1000 mg bid Diet is healthy, has lost 15 lbs intentionally.  Exercise: some PT  Mixed Hyperlipidemia Patient presents for Mixed Hyperlipidemia follow up. Currently taking Lipitor 20 mg/d and compliance with treatment thus far has been good. He denies myalgias. He is adhering to a healthy diet. Has intentionally lost 15 lbs.  The patient is not known to have coexisting coronary artery disease. No Cp or SOB.   Past Medical History:  Diagnosis Date   Diabetes mellitus without complication ([place])      Related testing: Retinal exam: Done Pneumovax: done  Objective:  BP 120/71    Pulse (!) 54    Temp 98.6 F (37 C) (Oral)    Ht 6\' 1"  (1.854 m)    Wt 246 lb 1 oz (111.6 kg)    SpO2 98%    BMI 32.46 kg/m  General:  Well developed, well nourished, in no apparent distress Lungs:  CTAB, no access msc use Cardio:  RRR, no bruits, no LE edema Psych: Age appropriate judgment and insight  Assessment:   Diabetes mellitus type 2 in obese ([place]), Chronic - Plan: Comprehensive metabolic panel, Lipid panel, Hemoglobin A1c  Mixed hyperlipidemia   Plan:   Chronic, unstable. Cont Metformin 1000 mg bid. Ck labs today. Counseled on diet and exercise. Will monitor sugars w CGM.  Chronic, stable. Cont Lipitor 20 mg/d.  F/u in 3-6 mo pending above.  The patient voiced understanding and agreement to the plan.  [place], DO 04/10/21 12:02 PM

## 2021-04-10 NOTE — Patient Instructions (Signed)
Give [place] 2-3 business days to get the results of your labs back. Our follow up will be based on your results.   Keep the diet clean and stay active.  Very strong work with your weight loss.  Let us know if you need anything.

## 2021-04-14 ENCOUNTER — Encounter: Payer: Self-pay | Admitting: Family Medicine

## 2021-04-15 ENCOUNTER — Other Ambulatory Visit: Payer: Self-pay

## 2021-04-15 ENCOUNTER — Encounter: Payer: Self-pay | Admitting: Rehabilitative and Restorative Service Providers"

## 2021-04-15 ENCOUNTER — Ambulatory Visit: Payer: Medicare Other | Admitting: Rehabilitative and Restorative Service Providers"

## 2021-04-15 DIAGNOSIS — R262 Difficulty in walking, not elsewhere classified: Secondary | ICD-10-CM

## 2021-04-15 DIAGNOSIS — M25562 Pain in left knee: Secondary | ICD-10-CM | POA: Diagnosis not present

## 2021-04-15 DIAGNOSIS — M545 Low back pain, unspecified: Secondary | ICD-10-CM

## 2021-04-15 DIAGNOSIS — M25551 Pain in right hip: Secondary | ICD-10-CM

## 2021-04-15 DIAGNOSIS — M6281 Muscle weakness (generalized): Secondary | ICD-10-CM

## 2021-04-15 DIAGNOSIS — M25552 Pain in left hip: Secondary | ICD-10-CM

## 2021-04-15 DIAGNOSIS — G8929 Other chronic pain: Secondary | ICD-10-CM

## 2021-04-15 NOTE — Therapy (Signed)
[facility] @ [place], [place], [address] Phone: [phone]   Fax:  [phone]  Physical Therapy Treatment  Patient Details  Name: Noah Lane MRN: [id number] Date of Birth: 01/28/1956 Referring Provider (PT): Dr. Clearance Coots   Encounter Date: 04/15/2021   PT End of Session - 04/15/21 0817     Visit Number 14    Date for PT Re-Evaluation 04/24/21    Authorization Type Medicare 20th visit progress note    PT Start Time 0812    PT Stop Time 0845    PT Time Calculation (min) 33 min    Activity Tolerance Patient tolerated treatment well    Behavior During Therapy [facility] for tasks assessed/performed             Past Medical History:  Diagnosis Date   Diabetes mellitus without complication [facility])     Past Surgical History:  Procedure Laterality Date   HERNIA REPAIR  2012    There were no vitals filed for this visit.   Subjective Assessment - 04/15/21 0818     Subjective Pt arrives 12 min late for his appointment. Pt states that he has been going to the gym to work out.    Pertinent History getting MRI for shoulder;  LBP and hip pain;  DM with neuropathy    Patient Stated Goals get back to hiking;  trip to [place];  get up from the chair without feeling it; lift the grandkids;  maybe play golf    Currently in Pain? Yes    Pain Score 7     Pain Location Leg   quads   Pain Orientation Right;Left    Pain Descriptors / Indicators Cramping                               [facility] Adult PT Treatment/Exercise - 04/15/21 0001       Knee/Hip Exercises: Stretches   Active Hamstring Stretch Both;1 rep;20 seconds    Active Hamstring Stretch Limitations On stairs    Quad Stretch Both;2 reps;20 seconds    Other Knee/Hip Stretches Modified downward dog using chair x5 reps      Knee/Hip Exercises: Machines for Strengthening   Hip Cybex Bil hip ext 55# 20x Bil      Knee/Hip Exercises:  Seated   Sit to Sand 2 sets;10 reps;without UE support   holding 10# kettlebell x10 with chest press, x10 with overhead press.     Shoulder Exercises: ROM/Strengthening   Lat Pull 20 reps    Lat Pull Limitations 40#    Cybex Row 20 reps    Cybex Row Limitations 25#    Wall Pushups 20 reps                       PT Short Term Goals - 04/01/21 0858       PT SHORT TERM GOAL #1   Title Pt will be I and compliant with initial HEP.    Status Achieved      PT SHORT TERM GOAL #2   Title Patient will have increased HS length to 65 degrees bil and hip extension to 10 degrees bil    Status Achieved      PT SHORT TERM GOAL #3   Title The patient will rise sit to stand 5x in 16 sec or less    Status Achieved  PT SHORT TERM GOAL #4   Title Pt will report ability to walk 1/2 mile with pain/discomfort 3/10 or less    Status Achieved               PT Long Term Goals - 04/09/21 0909       PT LONG TERM GOAL #1   Title The patient will be independent in a safe self progression of HEP    Status On-going      PT LONG TERM GOAL #2   Title Pt will report ability to walk dogs for at least 25 minutes at a time or 1 mile with pain 3/10 or less    Status On-going      PT LONG TERM GOAL #3   Title Bil knee and shoulder strength improved to 4+/5 and hip strength to 4/5 needed for standing/walking longer periods of time at work and for hiking and lifting grandkids    Status On-going      PT LONG TERM GOAL #4   Title FOTO score of knees to increase to 65% and shoulder to at least 64% indicating improved function with less pain.    Status On-going      PT LONG TERM GOAL #5   Title R shoulder flexion and abduction to increase to at least 130 degrees to allow pt to reach into overhead cabinets.    Status On-going                   Plan - 04/15/21 0848     Clinical Impression Statement Mr Pasqual continues to progress with goal related activities.  Added quad  stretch today secondary to patient complaining to quad cramping today and pt reported relief tollowing. Added modified downward dog using chair to faciltate increased overall stretching and body positioning. Limited in complete session today secondary to patient's late arrival. Pt continues to require skilled PT to progress with goal related activities.    Personal Factors and Comorbidities Comorbidity 2    Comorbidities DM with neuropathy;  chronic LBP/hip pain    PT Treatment/Interventions ADLs/Self Care Home Management;Aquatic Therapy;Cryotherapy;Electrical Stimulation;Iontophoresis 4mg /ml Dexamethasone;Moist Heat;Ultrasound;Neuromuscular re-education;Therapeutic exercise;Therapeutic activities;Patient/family education;Manual techniques;Dry needling;Taping    PT Next Visit Plan assess and progress HEP, strengthening, core stability, scapular stability    PT Home Exercise Plan [id number]    Consulted and Agree with Plan of Care Patient             Patient will benefit from skilled therapeutic intervention in order to improve the following deficits and impairments:  Difficulty walking, Pain, Decreased activity tolerance, Impaired perceived functional ability, Impaired flexibility, Decreased strength, Decreased range of motion, Postural dysfunction  Visit Diagnosis: Chronic pain of left knee  Chronic pain of right knee  Muscle weakness (generalized)  Difficulty in walking, not elsewhere classified  Acute bilateral low back pain without sciatica  Pain in right hip  Pain in left hip  Chronic right shoulder pain     Problem List Patient Active Problem List   Diagnosis Date Noted   Patellofemoral pain syndrome of both knees 02/24/2021   Rotator cuff tendinitis, right 12/28/2019   Hip pain, bilateral 05/28/2019   Chronic bilateral low back pain without sciatica 05/28/2019   Diabetes mellitus type 2 in obese ([place]) 05/28/2019   Erectile dysfunction 05/28/2019   Pain and swelling  of left knee 03/12/2019   Lateral epicondylitis, right elbow 03/15/2017    Juel Burrow, PT, DPT 04/15/2021, 8:57 AM  [facility] @ [place], [place], [address] Phone: [phone]   Fax:  [phone]  Name: Mayur Duman MRN: [id number] Date of Birth: [birth year]

## 2021-04-16 ENCOUNTER — Encounter: Payer: Self-pay | Admitting: Family Medicine

## 2021-04-17 ENCOUNTER — Encounter: Payer: Self-pay | Admitting: Physical Therapy

## 2021-04-17 ENCOUNTER — Other Ambulatory Visit: Payer: Self-pay

## 2021-04-17 ENCOUNTER — Ambulatory Visit: Payer: Medicare Other | Admitting: Physical Therapy

## 2021-04-17 DIAGNOSIS — M25562 Pain in left knee: Secondary | ICD-10-CM

## 2021-04-17 DIAGNOSIS — M6281 Muscle weakness (generalized): Secondary | ICD-10-CM

## 2021-04-17 DIAGNOSIS — G8929 Other chronic pain: Secondary | ICD-10-CM

## 2021-04-17 DIAGNOSIS — M25561 Pain in right knee: Secondary | ICD-10-CM

## 2021-04-17 DIAGNOSIS — M25552 Pain in left hip: Secondary | ICD-10-CM

## 2021-04-17 DIAGNOSIS — M545 Low back pain, unspecified: Secondary | ICD-10-CM

## 2021-04-17 DIAGNOSIS — M25551 Pain in right hip: Secondary | ICD-10-CM

## 2021-04-17 DIAGNOSIS — R262 Difficulty in walking, not elsewhere classified: Secondary | ICD-10-CM

## 2021-04-17 NOTE — Therapy (Signed)
[facility] @ [place], [place], [address] Phone: [phone]   Fax:  [phone]  Physical Therapy Treatment  Patient Details  Name: [address] Lane MRN: [id number] Date of Birth: 1955/08/13 Referring Provider (PT): Dr. Clearance Coots   Encounter Date: 04/17/2021   PT End of Session - 04/17/21 0756     Visit Number 15    Date for PT Re-Evaluation 04/24/21    Authorization Type Medicare 20th visit progress note    PT Start Time 0756    PT Stop Time 0840    PT Time Calculation (min) 44 min    Activity Tolerance Patient tolerated treatment well             Past Medical History:  Diagnosis Date   Diabetes mellitus without complication [facility])     Past Surgical History:  Procedure Laterality Date   HERNIA REPAIR  2012    There were no vitals filed for this visit.   Subjective Assessment - 04/17/21 0803     Subjective I had to give my shoulders a break from exercise, it was starting to hurt more. My knees are just achey.    Pertinent History getting MRI for shoulder;  LBP and hip pain;  DM with neuropathy    Currently in Pain? Yes   [place] knees 3-5/10                              [facility] Adult PT Treatment/Exercise - 04/17/21 0001       Knee/Hip Exercises: Stretches   Active Hamstring Stretch Both;1 rep;30 seconds    Active Hamstring Stretch Limitations On stairs    Quad Stretch Both;2 reps;20 seconds    Hip Flexor Stretch Both;1 rep;30 seconds    Hip Flexor Stretch Limitations on stairs    Other Knee/Hip Stretches Modified downward dog using chair x10 reps      Knee/Hip Exercises: Machines for Strengthening   Hip Cybex Seat 9: 70# 3x10      Knee/Hip Exercises: Seated   Abd/Adduction Limitations Sitting on blue ball: red band horizontal abd 2x10    Sit to Sand 2 sets;10 reps;without UE support   10# KB upright row, VC for core     Knee/Hip Exercises: Supine   Other Supine  Knee/Hip Exercises dying bug holding 3# 10x Bil      Knee/Hip Exercises: Sidelying   Other Sidelying Knee/Hip Exercises Modified side plank: Bil feet lift & hold 5 sec 10x Bil                       PT Short Term Goals - 04/01/21 0858       PT SHORT TERM GOAL #1   Title Pt will be I and compliant with initial HEP.    Status Achieved      PT SHORT TERM GOAL #2   Title Patient will have increased HS length to 65 degrees bil and hip extension to 10 degrees bil    Status Achieved      PT SHORT TERM GOAL #3   Title The patient will rise sit to stand 5x in 16 sec or less    Status Achieved      PT SHORT TERM GOAL #4   Title Pt will report ability to walk 1/2 mile with pain/discomfort 3/10 or less    Status Achieved  PT Long Term Goals - 04/09/21 0909       PT LONG TERM GOAL #1   Title The patient will be independent in a safe self progression of HEP    Status On-going      PT LONG TERM GOAL #2   Title Pt will report ability to walk dogs for at least 25 minutes at a time or 1 mile with pain 3/10 or less    Status On-going      PT LONG TERM GOAL #3   Title Bil knee and shoulder strength improved to 4+/5 and hip strength to 4/5 needed for standing/walking longer periods of time at work and for hiking and lifting grandkids    Status On-going      PT LONG TERM GOAL #4   Title FOTO score of knees to increase to 65% and shoulder to at least 64% indicating improved function with less pain.    Status On-going      PT LONG TERM GOAL #5   Title R shoulder flexion and abduction to increase to at least 130 degrees to allow pt to reach into overhead cabinets.    Status On-going                   Plan - 04/17/21 5009     Clinical Impression Statement Pt reports he had to take a break from his shoulder exercises over the last day or so as they were starting to hurt more. He believes "this is part of the process." Pt reports his knees have been  "achey." Add some supine and S/L core strengthening exercises to todays session. Pt did well with core contraction, difficult for pt to find his breathing.    Personal Factors and Comorbidities Comorbidity 2    Comorbidities DM with neuropathy;  chronic LBP/hip pain    Examination-Activity Limitations Locomotion Level;Lift;Stairs;Stand;Transfers    Examination-Participation Restrictions Occupation;Interpersonal Relationship;Community Activity    Stability/Clinical Decision Making Evolving/Moderate complexity    Rehab Potential Good    PT Frequency 2x / week    PT Duration 6 weeks    PT Treatment/Interventions ADLs/Self Care Home Management;Aquatic Therapy;Cryotherapy;Electrical Stimulation;Iontophoresis 4mg /ml Dexamethasone;Moist Heat;Ultrasound;Neuromuscular re-education;Therapeutic exercise;Therapeutic activities;Patient/family education;Manual techniques;Dry needling;Taping    PT Next Visit Plan assess and progress HEP, strengthening, core stability, scapular stability: pt has 2 visits remaining    PT Home Exercise Plan [id number]    Consulted and Agree with Plan of Care Patient             Patient will benefit from skilled therapeutic intervention in order to improve the following deficits and impairments:  Difficulty walking, Pain, Decreased activity tolerance, Impaired perceived functional ability, Impaired flexibility, Decreased strength, Decreased range of motion, Postural dysfunction  Visit Diagnosis: Chronic pain of left knee  Chronic pain of right knee  Muscle weakness (generalized)  Difficulty in walking, not elsewhere classified  Acute bilateral low back pain without sciatica  Pain in right hip  Pain in left hip  Chronic right shoulder pain     Problem List Patient Active Problem List   Diagnosis Date Noted   Patellofemoral pain syndrome of both knees 02/24/2021   Rotator cuff tendinitis, right 12/28/2019   Hip pain, bilateral 05/28/2019   Chronic bilateral  low back pain without sciatica 05/28/2019   Diabetes mellitus type 2 in obese ([place]) 05/28/2019   Erectile dysfunction 05/28/2019   Pain and swelling of left knee 03/12/2019   Lateral epicondylitis, right elbow 03/15/2017    Lurine Imel, PTA  04/17/2021, 8:40 AM  [facility] @ [place], [place], [address] Phone: [phone]   Fax:  [phone]  Name: [place] Lane MRN: [id number] Date of Birth: [birth year]

## 2021-04-22 ENCOUNTER — Other Ambulatory Visit: Payer: Self-pay | Admitting: Family Medicine

## 2021-04-22 ENCOUNTER — Other Ambulatory Visit: Payer: Self-pay

## 2021-04-22 ENCOUNTER — Encounter: Payer: Self-pay | Admitting: Rehabilitative and Restorative Service Providers"

## 2021-04-22 ENCOUNTER — Ambulatory Visit: Payer: Medicare Other | Admitting: Rehabilitative and Restorative Service Providers"

## 2021-04-22 DIAGNOSIS — G8929 Other chronic pain: Secondary | ICD-10-CM

## 2021-04-22 DIAGNOSIS — M25552 Pain in left hip: Secondary | ICD-10-CM

## 2021-04-22 DIAGNOSIS — M25551 Pain in right hip: Secondary | ICD-10-CM

## 2021-04-22 DIAGNOSIS — M25562 Pain in left knee: Secondary | ICD-10-CM

## 2021-04-22 DIAGNOSIS — R262 Difficulty in walking, not elsewhere classified: Secondary | ICD-10-CM

## 2021-04-22 DIAGNOSIS — M6281 Muscle weakness (generalized): Secondary | ICD-10-CM

## 2021-04-22 DIAGNOSIS — M545 Low back pain, unspecified: Secondary | ICD-10-CM

## 2021-04-22 NOTE — Therapy (Signed)
OUTPATIENT PHYSICAL THERAPY RE-EVALUATION   Patient Name: Noah Lane MRN: [id number] DOB:[birth year], 65 y.o., male Today's Date: 04/22/2021   PT End of Session - 04/22/21 0800     Visit Number 16    Date for PT Re-Evaluation 04/24/21    Authorization Type Medicare 20th visit progress note    PT Start Time 0800    PT Stop Time 0840    PT Time Calculation (min) 40 min    Activity Tolerance Patient tolerated treatment well    Behavior During Therapy [facility] for tasks assessed/performed             Past Medical History:  Diagnosis Date   Diabetes mellitus without complication ([place])    Past Surgical History:  Procedure Laterality Date   HERNIA REPAIR  2012   Patient Active Problem List   Diagnosis Date Noted   Patellofemoral pain syndrome of both knees 02/24/2021   Rotator cuff tendinitis, right 12/28/2019   Hip pain, bilateral 05/28/2019   Chronic bilateral low back pain without sciatica 05/28/2019   Diabetes mellitus type 2 in obese ([place]) 05/28/2019   Erectile dysfunction 05/28/2019   Pain and swelling of left knee 03/12/2019   Lateral epicondylitis, right elbow 03/15/2017    PCP: [name], DO  REFERRING PROVIDER: Dr Clearance Coots  REFERRING DIAG: Rotator cuff tendinitis, right. bil patellofemoral pain  THERAPY DIAG:  Chronic pain of left knee - Plan: PT plan of care cert/re-cert  Chronic pain of right knee - Plan: PT plan of care cert/re-cert  Muscle weakness (generalized) - Plan: PT plan of care cert/re-cert  Difficulty in walking, not elsewhere classified - Plan: PT plan of care cert/re-cert  Acute bilateral low back pain without sciatica - Plan: PT plan of care cert/re-cert  Pain in right hip - Plan: PT plan of care cert/re-cert  Pain in left hip - Plan: PT plan of care cert/re-cert  Chronic right shoulder pain - Plan: PT plan of care cert/re-cert   ONSET DATE: 65/99/3570  SUBJECTIVE:                                                                                                                                                                                       SUBJECTIVE STATEMENT: I was able to walk for a mile.  Pt reports that he is making improvements, but still having some difficulty with tasks.  PERTINENT HISTORY: DM with neuropathy, chronic LBP/hip pain  PAIN:  Are you having pain? Yes NPRS scale: 1/10 Pain location: R shoulder and R knee Pain orientation: Right  PAIN TYPE: aching and sharp Pain description: intermittent  Aggravating factors: worse at night when going  to bed and after lifting. Relieving factors: rest  PRECAUTIONS: None  WEIGHT BEARING RESTRICTIONS No  FALLS:  Has patient fallen in last 6 months? No Number of falls: 0  LIVING ENVIRONMENT: Lives with: lives with their spouse Lives in: House/apartment Stairs: No;  Has following equipment at home: Single point cane and Grab bars  OCCUPATION: Realtor  PLOF: Independent  PATIENT GOALS: get back to hiking;  trip to [place];  get up from the chair without feeling it; lift the grandkids;  maybe play golf  OBJECTIVE:   DIAGNOSTIC FINDINGS:  Shoulder MRI on 03/15/21:  1. Moderate anterior greater than mid AP dimension of the infraspinatus tendinosis. Small partial-thickness midsubstance tears of the anterior infraspinatus tendon both proximally and distally. The distal anterior infraspinatus tendon tear minimally extends through the bursal tendon surface. 2. Mild superior subscapularis tendinosis. 3. 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 Type 3 acromion. 4. Mild subacromial/subdeltoid bursitis. 5. Mild-to-moderate glenohumeral cartilage degenerative changes with posteroinferior glenoid labrum likely degenerative tear.  PATIENT SURVEYS:  FOTO :  Shoulder 60%, Knee 65%  COGNITION:  Overall cognitive status: Within functional limits for tasks assessed  POSTURE: Forward head, rounded shoulder  UPPER  EXTREMITY AROM/PROM:  A/PROM Right 04/22/2021 Left 04/22/2021  Shoulder flexion 130 150  Shoulder extension    Shoulder abduction 120 135  Shoulder adduction    Shoulder internal rotation    Shoulder external rotation    Elbow flexion    Elbow extension    Wrist flexion    Wrist extension    Wrist ulnar deviation    Wrist radial deviation    Wrist pronation    Wrist supination    (Blank rows = not tested)  UPPER EXTREMITY MMT:  04/22/2021 R shoulder strength of grossly 4 to 4+/5 throughout.  R hip strength of 4/5, R hamstring of 4/5, R quad of 4+/5.   TODAY'S TREATMENT:   04/22/21: Leg Press 70# 3x10, seat at 9 Row 30# 2x10 Lat Pull 40# 2x10 Seated sit up 10# 2x10 Modified downward dog with chair X5 reps Modified deadlift 10# kettlebell x15   PATIENT EDUCATION: Education details: Pt educated on the importance of continued HEP. Person educated: Patient Education method: Explanation Education comprehension: verbalized understanding   HOME EXERCISE PROGRAM: Access Code: [email or web address] URL: [email or web address] Date: 04/22/2021 Prepared by: Shelby Dubin Tiernan Suto  Exercises Hip Flexor Stretch on Step - 1 x daily - 7 x weekly - 1 sets - 10 reps Standing Quad Stretch with Table and Chair Support - 2 x daily - 7 x weekly - 1 sets - 3 reps - 30 sec hold Long Sitting Quad Set - 2 x daily - 7 x weekly - 1 sets - 20 reps - 2-3 sec hold Supine Knee Extension Strengthening - 2 x daily - 7 x weekly - 1 sets - 20 reps Small Range Straight Leg Raise - 2 x daily - 7 x weekly - 1 sets - 20 reps Supine Bridge - 1 x daily - 7 x weekly - 1 sets - 10 reps Hooklying Isometric Hip Flexion - 1 x daily - 7 x weekly - 1 sets - 10 reps - 5 hold Sidelying Hip Abduction - 1 x daily - 7 x weekly - 1 sets - 10 reps Sit to Stand Without Arm Support - 1 x daily - 7 x weekly - 1 sets - 10 reps Standing Shoulder Row with Anchored Resistance - 1-2 x daily - 7 x weekly - 2 sets - 10 reps  Shoulder  extension with resistance - Neutral - 1-2 x daily - 7 x weekly - 2 sets - 10 reps Wall Push Up - 1-2 x daily - 7 x weekly - 2 sets - 10 reps Sit to Stand Without Arm Support - 1 x daily - 7 x weekly - 2 sets - 10 reps   ASSESSMENT:  CLINICAL IMPRESSION: Patient is a 66 y.o. male who was seen today for physical therapy re-evaluation and treatment for R knee patellofemoral syndrome and R shoulder rotator cuff tendonitis. Pt has been working with PT and progressing towards goal related activities with increased scores noted on FOTO. Pt is making progress with increased right shoulder A/ROM and strength as well as progress towards right LE strength.  Pt continues to progress with functional activities, but is still unable to perform tasks like golfing and has pain with ambulation. Pt continues to require skilled PT of 2x/week for 8 additional weeks to progress towards goal related activities.   OBJECTIVE IMPAIRMENTS decreased activity tolerance, decreased balance, difficulty walking, decreased ROM, decreased strength, increased muscle spasms, impaired flexibility, impaired UE functional use, improper body mechanics, postural dysfunction, and pain.   ACTIVITY LIMITATIONS community activity and yard work.   PERSONAL FACTORS 1-2 comorbidities: DM with neuropathy; chronic LBP/hip pain  are also affecting patient's functional outcome.    REHAB POTENTIAL: Good  CLINICAL DECISION MAKING: Evolving/moderate complexity  EVALUATION COMPLEXITY: Moderate   GOALS: Goals reviewed with patient? Yes  SHORT TERM GOALS:  STG Name Target Date Goal status  1 Pt will be I and compliant with initial HEP.  Baseline:  05/06/2021 MET  2 Patient will have increased HS length to 65 degrees bil and hip extension to 10 degrees bil  Baseline:  05/06/2021 MET  3 The patient will rise sit to stand 5x in 16 sec or less  Baseline: 05/06/2021 MET  4 Pt will report ability to walk 1/2 mile with pain/discomfort 3/10 or less   Baseline: 05/06/2021 MET   LONG TERM GOALS:   LTG Name Target Date Goal status  1 The patient will be independent in a safe self progression of HEP  Baseline: 06/17/2021 IN PROGRESS  2 Pt will report ability to walk dogs for at least 25 minutes at a time or 1 mile with pain 3/10 or less  Baseline: 06/17/2021 IN PROGRESS  3 Bil knee and shoulder strength improved to 4+/5 and hip strength to 4/5 needed for standing/walking longer periods of time at work and for hiking and lifting grandkids  Baseline: 06/17/2021 IN PROGRESS  4 FOTO score of knees to increase to 65% and shoulder to at least 64% indicating improved function with less pain.  Baseline: 06/17/2021 IN PROGRESS  5 R shoulder flexion and abduction to increase to at least 130 degrees to allow pt to reach into overhead cabinets. Baseline: 06/17/2021 IN PROGRESS   PLAN: PT FREQUENCY: 2x/week  PT DURATION: 8 weeks  PLANNED INTERVENTIONS: Therapeutic exercises, Therapeutic activity, Neuro Muscular re-education, Balance training, Gait training, Patient/Family education, Joint mobilization, Stair training, Aquatic Therapy, Dry Needling, Electrical stimulation, Cryotherapy, Moist heat, Taping, Vasopneumatic device, Ultrasound, Ionotophoresis 50m/ml Dexamethasone, and Manual therapy  PLAN FOR NEXT SESSION: KX Modifier Needed.  Strengthening, flexibility   SJuel Burrow PT 04/22/2021, 9:08 AM   [facility] [phone] [id number] # [phone] [phone]

## 2021-04-24 ENCOUNTER — Ambulatory Visit: Payer: Medicare Other | Admitting: Physical Therapy

## 2021-04-24 ENCOUNTER — Encounter: Payer: Self-pay | Admitting: Physical Therapy

## 2021-04-24 ENCOUNTER — Other Ambulatory Visit: Payer: Self-pay

## 2021-04-24 DIAGNOSIS — M545 Low back pain, unspecified: Secondary | ICD-10-CM

## 2021-04-24 DIAGNOSIS — M25511 Pain in right shoulder: Secondary | ICD-10-CM

## 2021-04-24 DIAGNOSIS — M25552 Pain in left hip: Secondary | ICD-10-CM

## 2021-04-24 DIAGNOSIS — G8929 Other chronic pain: Secondary | ICD-10-CM

## 2021-04-24 DIAGNOSIS — M25562 Pain in left knee: Secondary | ICD-10-CM

## 2021-04-24 DIAGNOSIS — M25551 Pain in right hip: Secondary | ICD-10-CM

## 2021-04-24 DIAGNOSIS — M6281 Muscle weakness (generalized): Secondary | ICD-10-CM

## 2021-04-24 DIAGNOSIS — R262 Difficulty in walking, not elsewhere classified: Secondary | ICD-10-CM

## 2021-04-24 NOTE — Therapy (Signed)
OUTPATIENT PHYSICAL THERAPY TREATMENT NOTE   Patient Name: Tanya Marvin MRN: [id number] DOB:08/24/1955, 66 y.o., male Today's Date: 04/24/2021  PCP: [name], DO REFERRING PROVIDER: Rosemarie Ax, MD   PT End of Session - 04/24/21 0755     Visit Number 17    Date for PT Re-Evaluation 04/24/21    Authorization Type Medicare 20th visit progress note    PT Start Time 0755    PT Stop Time 0833    PT Time Calculation (min) 38 min    Activity Tolerance Patient tolerated treatment well    Behavior During Therapy [facility] for tasks assessed/performed             Past Medical History:  Diagnosis Date   Diabetes mellitus without complication [facility])    Past Surgical History:  Procedure Laterality Date   HERNIA REPAIR  2012   Patient Active Problem List   Diagnosis Date Noted   Patellofemoral pain syndrome of both knees 02/24/2021   Rotator cuff tendinitis, right 12/28/2019   Hip pain, bilateral 05/28/2019   Chronic bilateral low back pain without sciatica 05/28/2019   Diabetes mellitus type 2 in obese ([place]) 05/28/2019   Erectile dysfunction 05/28/2019   Pain and swelling of left knee 03/12/2019   Lateral epicondylitis, right elbow 03/15/2017    REFERRING DIAG: Rotator cuff tendinitis, right. bil patellofemoral pain    THERAPY DIAG:  Chronic pain of left knee  Chronic pain of right knee  Muscle weakness (generalized)  Difficulty in walking, not elsewhere classified  Acute bilateral low back pain without sciatica  Pain in right hip  Pain in left hip  Chronic right shoulder pain  PERTINENT HISTORY: DM with neuropathy, chronic LBP/hip pain  PRECAUTIONS: None  SUBJECTIVE: Went  for 15 min walk this AM and it went ok.   PAIN:  Are you having pain? No   OBJECTIVE:    DIAGNOSTIC FINDINGS:  Shoulder MRI on 03/15/21:  1. Moderate anterior greater than mid AP dimension of the infraspinatus tendinosis. Small partial-thickness midsubstance  tears of the anterior infraspinatus tendon both proximally and distally. The distal anterior infraspinatus tendon tear minimally extends through the bursal tendon surface. 2. Mild superior subscapularis tendinosis. 3. 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 Type 3 acromion. 4. Mild subacromial/subdeltoid bursitis. 5. Mild-to-moderate glenohumeral cartilage degenerative changes with posteroinferior glenoid labrum likely degenerative tear.   PATIENT SURVEYS:  FOTO :  Shoulder 60%, Knee 65%   COGNITION:          Overall cognitive status: Within functional limits for tasks assessed   POSTURE: Forward head, rounded shoulder   UPPER EXTREMITY AROM/PROM:   A/PROM Right 04/22/2021 Left 04/22/2021  Shoulder flexion 130 150  Shoulder extension      Shoulder abduction 120 135  Shoulder adduction      Shoulder internal rotation      Shoulder external rotation      Elbow flexion      Elbow extension      Wrist flexion      Wrist extension      Wrist ulnar deviation      Wrist radial deviation      Wrist pronation      Wrist supination      (Blank rows = not tested)   UPPER EXTREMITY MMT:   04/22/2021 R shoulder strength of grossly 4 to 4+/5 throughout.  R hip strength of 4/5, R hamstring of 4/5, R quad of 4+/5.  TODAY'S TREATMENT:    04/24/2021: Nustep L2 5 min Sit Stand 15#  KB upright row 3x10 Seated row 30# 3x10 Hamstring and hip flexor stretch on stairs Bil Leg press Seat 9: BLE 75# 3x10, Single leg 50# 20x Bil Lat Pull 40# 2x15 Modified down dog with chair 5x  04/22/21: Leg Press 70# 3x10, seat at 9 Row 30# 2x10 Lat Pull 40# 2x10 Seated sit up 10# 2x10 Modified downward dog with chair X5 reps Modified deadlift 10# kettlebell x15     PATIENT EDUCATION: Education details: Pt educated on the importance of continued HEP. Person educated: Patient Education method: Explanation Education comprehension: verbalized understanding      HOME EXERCISE PROGRAM: Access Code: [email or web address] URL: [email or web address] Date: 04/22/2021 Prepared by: Shelby Dubin Menke   Exercises Hip Flexor Stretch on Step - 1 x daily - 7 x weekly - 1 sets - 10 reps Standing Quad Stretch with Table and Chair Support - 2 x daily - 7 x weekly - 1 sets - 3 reps - 30 sec hold Long Sitting Quad Set - 2 x daily - 7 x weekly - 1 sets - 20 reps - 2-3 sec hold Supine Knee Extension Strengthening - 2 x daily - 7 x weekly - 1 sets - 20 reps Small Range Straight Leg Raise - 2 x daily - 7 x weekly - 1 sets - 20 reps Supine Bridge - 1 x daily - 7 x weekly - 1 sets - 10 reps Hooklying Isometric Hip Flexion - 1 x daily - 7 x weekly - 1 sets - 10 reps - 5 hold Sidelying Hip Abduction - 1 x daily - 7 x weekly - 1 sets - 10 reps Sit to Stand Without Arm Support - 1 x daily - 7 x weekly - 1 sets - 10 reps Standing Shoulder Row with Anchored Resistance - 1-2 x daily - 7 x weekly - 2 sets - 10 reps Shoulder extension with resistance - Neutral - 1-2 x daily - 7 x weekly - 2 sets - 10 reps Wall Push Up - 1-2 x daily - 7 x weekly - 2 sets - 10 reps Sit to Stand Without Arm Support - 1 x daily - 7 x weekly - 2 sets - 10 reps     ASSESSMENT:   CLINICAL IMPRESSION: Pt  able to increase loads and/or reps/sets with todays TE without any pain.      OBJECTIVE IMPAIRMENTS decreased activity tolerance, decreased balance, difficulty walking, decreased ROM, decreased strength, increased muscle spasms, impaired flexibility, impaired UE functional use, improper body mechanics, postural dysfunction, and pain.    ACTIVITY LIMITATIONS community activity and yard work.    PERSONAL FACTORS 1-2 comorbidities: DM with neuropathy; chronic LBP/hip pain  are also affecting patient's functional outcome.      REHAB POTENTIAL: Good   CLINICAL DECISION MAKING: Evolving/moderate complexity   EVALUATION COMPLEXITY: Moderate     GOALS: Goals reviewed with patient? Yes   SHORT  TERM GOALS:   STG Name Target Date Goal status  1 Pt will be I and compliant with initial HEP.  Baseline:  05/06/2021 MET  2 Patient will have increased HS length to 65 degrees bil and hip extension to 10 degrees bil  Baseline:  05/06/2021 MET  3 The patient will rise sit to stand 5x in 16 sec or less  Baseline: 05/06/2021 MET  4 Pt will report ability to walk 1/2 mile with pain/discomfort 3/10 or less  Baseline: 05/06/2021 MET  LONG TERM GOALS:    LTG Name Target Date Goal status  1 The patient will be independent in a safe self progression of HEP  Baseline: 06/17/2021 IN PROGRESS  2 Pt will report ability to walk dogs for at least 25 minutes at a time or 1 mile with pain 3/10 or less  Baseline: 06/17/2021 IN PROGRESS  3 Bil knee and shoulder strength improved to 4+/5 and hip strength to 4/5 needed for standing/walking longer periods of time at work and for hiking and lifting grandkids  Baseline: 06/17/2021 IN PROGRESS  4 FOTO score of knees to increase to 65% and shoulder to at least 64% indicating improved function with less pain.  Baseline: 06/17/2021 IN PROGRESS  5 R shoulder flexion and abduction to increase to at least 130 degrees to allow pt to reach into overhead cabinets. Baseline: 06/17/2021 IN PROGRESS    PLAN: PT FREQUENCY: 2x/week   PT DURATION: 8 weeks   PLANNED INTERVENTIONS: Therapeutic exercises, Therapeutic activity, Neuro Muscular re-education, Balance training, Gait training, Patient/Family education, Joint mobilization, Stair training, Aquatic Therapy, Dry Needling, Electrical stimulation, Cryotherapy, Moist heat, Taping, Vasopneumatic device, Ultrasound, Ionotophoresis 104m/ml Dexamethasone, and Manual therapy   PLAN FOR NEXT SESSION: KX Modifier Needed.  Strengthening, flexibility       Jacquelyne Quarry, PTA 04/24/2021, 8:30 AM

## 2021-04-28 ENCOUNTER — Ambulatory Visit: Payer: Medicare Other | Admitting: Rehabilitative and Restorative Service Providers"

## 2021-04-28 ENCOUNTER — Other Ambulatory Visit: Payer: Self-pay

## 2021-04-28 ENCOUNTER — Encounter: Payer: Self-pay | Admitting: Rehabilitative and Restorative Service Providers"

## 2021-04-28 DIAGNOSIS — M25551 Pain in right hip: Secondary | ICD-10-CM

## 2021-04-28 DIAGNOSIS — G8929 Other chronic pain: Secondary | ICD-10-CM

## 2021-04-28 DIAGNOSIS — M25562 Pain in left knee: Secondary | ICD-10-CM

## 2021-04-28 DIAGNOSIS — M25552 Pain in left hip: Secondary | ICD-10-CM

## 2021-04-28 DIAGNOSIS — M6281 Muscle weakness (generalized): Secondary | ICD-10-CM

## 2021-04-28 DIAGNOSIS — R262 Difficulty in walking, not elsewhere classified: Secondary | ICD-10-CM

## 2021-04-28 DIAGNOSIS — M545 Low back pain, unspecified: Secondary | ICD-10-CM

## 2021-04-28 NOTE — Therapy (Signed)
OUTPATIENT PHYSICAL THERAPY TREATMENT NOTE   Patient Name: Noah Lane MRN: [id number] DOB:[birth year], 66 y.o., male Today's Date: 04/28/2021  PCP: Noah Pal, DO REFERRING PROVIDER: Dr Noah Lane   PT End of Session - 04/28/21 0847     Visit Number 18    Date for PT Re-Evaluation 06/19/21    Authorization Type Medicare 20th visit progress note    PT Start Time 0845    PT Stop Time 0925    PT Time Calculation (min) 40 min    Activity Tolerance Patient tolerated treatment well    Behavior During Therapy [facility] for tasks assessed/performed             Past Medical History:  Diagnosis Date   Diabetes mellitus without complication ([place])    Past Surgical History:  Procedure Laterality Date   HERNIA REPAIR  2012   Patient Active Problem List   Diagnosis Date Noted   Patellofemoral pain syndrome of both knees 02/24/2021   Rotator cuff tendinitis, right 12/28/2019   Hip pain, bilateral 05/28/2019   Chronic bilateral low back pain without sciatica 05/28/2019   Diabetes mellitus type 2 in obese ([place]) 05/28/2019   Erectile dysfunction 05/28/2019   Pain and swelling of left knee 03/12/2019   Lateral epicondylitis, right elbow 03/15/2017    REFERRING DIAG: Rotator cuff tendinitis, right. bil patellofemoral pain    THERAPY DIAG:  Chronic pain of left knee  Chronic pain of right knee  Muscle weakness (generalized)  Difficulty in walking, not elsewhere classified  Acute bilateral low back pain without sciatica  Pain in right hip  Pain in left hip  Chronic right shoulder pain  PERTINENT HISTORY: DM with neuropathy, chronic LBP/hip pain  PRECAUTIONS: None  SUBJECTIVE: Pt reports that he had a lot of shoulder pain the past couple of days.  Pt reports that he cannot wear the nitro patch every day secondary to headaches.  PAIN:  Are you having pain? Yes Reports R shoulder pain of 7-8/10, describes sharp sensation   OBJECTIVE:    DIAGNOSTIC  FINDINGS:  Shoulder MRI on 03/15/21:  1. Moderate anterior greater than mid AP dimension of the infraspinatus tendinosis. Small partial-thickness midsubstance tears of the anterior infraspinatus tendon both proximally and distally. The distal anterior infraspinatus tendon tear minimally extends through the bursal tendon surface. 2. Mild superior subscapularis tendinosis. 3. 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 Type 3 acromion. 4. Mild subacromial/subdeltoid bursitis. 5. Mild-to-moderate glenohumeral cartilage degenerative changes with posteroinferior glenoid labrum likely degenerative tear.   PATIENT SURVEYS:  FOTO :  Shoulder 60%, Knee 65%   COGNITION:          Overall cognitive status: Within functional limits for tasks assessed   POSTURE: Forward head, rounded shoulder   UPPER EXTREMITY AROM/PROM:   A/PROM Right 04/22/2021 Left 04/22/2021  Shoulder flexion 130 150  Shoulder extension      Shoulder abduction 120 135  Shoulder adduction      Shoulder internal rotation      Shoulder external rotation      Elbow flexion      Elbow extension      Wrist flexion      Wrist extension      Wrist ulnar deviation      Wrist radial deviation      Wrist pronation      Wrist supination      (Blank rows = not tested)   UPPER EXTREMITY MMT:   04/22/2021 R shoulder strength of  grossly 4 to 4+/5 throughout.  R hip strength of 4/5, R hamstring of 4/5, R quad of 4+/5.            TODAY'S TREATMENT:    04/28/2021: Nustep L6 x5 min with PT present to discuss status Leg press Seat 9: BLE 75# 3x10, Single leg 50# 20x Bil Seated row 30# 3x10 Lat Pull 40# 2x15 Hamstring stretch and hip flexor stretch 2 x 20 sec each bilat Sit to/from stand holding 10# x10 with chest press, x10 with overhead press Step up fwd and lateral 6" step x10 each bilat   04/24/2021: Nustep L2 5 min Sit Stand 15#  KB upright row 3x10 Seated row 30# 3x10 Hamstring and hip flexor stretch  on stairs Bil Leg press Seat 9: BLE 75# 3x10, Single leg 50# 20x Bil Lat Pull 40# 2x15 Modified down dog with chair 5x  04/22/21: Leg Press 70# 3x10, seat at 9 Row 30# 2x10 Lat Pull 40# 2x10 Seated sit up 10# 2x10 Modified downward dog with chair X5 reps Modified deadlift 10# kettlebell x15     PATIENT EDUCATION: Education details: Pt educated on the importance of continued HEP. Person educated: Patient Education method: Explanation Education comprehension: verbalized understanding     HOME EXERCISE PROGRAM: Access Code: [email or web address] URL: [email or web address] Date: 04/22/2021 Prepared by: Noah Lane   Exercises Hip Flexor Stretch on Step - 1 x daily - 7 x weekly - 1 sets - 10 reps Standing Quad Stretch with Table and Chair Support - 2 x daily - 7 x weekly - 1 sets - 3 reps - 30 sec hold Long Sitting Quad Set - 2 x daily - 7 x weekly - 1 sets - 20 reps - 2-3 sec hold Supine Knee Extension Strengthening - 2 x daily - 7 x weekly - 1 sets - 20 reps Small Range Straight Leg Raise - 2 x daily - 7 x weekly - 1 sets - 20 reps Supine Bridge - 1 x daily - 7 x weekly - 1 sets - 10 reps Hooklying Isometric Hip Flexion - 1 x daily - 7 x weekly - 1 sets - 10 reps - 5 hold Sidelying Hip Abduction - 1 x daily - 7 x weekly - 1 sets - 10 reps Sit to Stand Without Arm Support - 1 x daily - 7 x weekly - 1 sets - 10 reps Standing Shoulder Row with Anchored Resistance - 1-2 x daily - 7 x weekly - 2 sets - 10 reps Shoulder extension with resistance - Neutral - 1-2 x daily - 7 x weekly - 2 sets - 10 reps Wall Push Up - 1-2 x daily - 7 x weekly - 2 sets - 10 reps Sit to Stand Without Arm Support - 1 x daily - 7 x weekly - 2 sets - 10 reps     ASSESSMENT:   CLINICAL IMPRESSION: Noah Lane continues to progress towards goal related activities.  He continues to progress towards increased strengthening and ability to tolerate more activities in session.  Pt denies increased pain during  there ex.  Pt only requires occasional cuing with technique during there ex.  Pt continues to require skilled PT to progress towards ability to perform his functional activities without pain and walk his dogs.     OBJECTIVE IMPAIRMENTS decreased activity tolerance, decreased balance, difficulty walking, decreased ROM, decreased strength, increased muscle spasms, impaired flexibility, impaired UE functional use, improper body mechanics, postural dysfunction, and pain.    ACTIVITY LIMITATIONS  community activity and yard work.    PERSONAL FACTORS 1-2 comorbidities: DM with neuropathy; chronic LBP/hip pain  are also affecting patient's functional outcome.      REHAB POTENTIAL: Good   CLINICAL DECISION MAKING: Evolving/moderate complexity   EVALUATION COMPLEXITY: Moderate     GOALS: Goals reviewed with patient? Yes   SHORT TERM GOALS:   STG Name Target Date Goal status  1 Pt will be I and compliant with initial HEP.  Baseline:  05/06/2021 MET  2 Patient will have increased HS length to 65 degrees bil and hip extension to 10 degrees bil  Baseline:  05/06/2021 MET  3 The patient will rise sit to stand 5x in 16 sec or less  Baseline: 05/06/2021 MET  4 Pt will report ability to walk 1/2 mile with pain/discomfort 3/10 or less  Baseline: 05/06/2021 MET    LONG TERM GOALS:    LTG Name Target Date Goal status  1 The patient will be independent in a safe self progression of HEP  Baseline: 06/17/2021 IN PROGRESS  2 Pt will report ability to walk dogs for at least 25 minutes at a time or 1 mile with pain 3/10 or less  Baseline: 06/17/2021 IN PROGRESS  3 Bil knee and shoulder strength improved to 4+/5 and hip strength to 4/5 needed for standing/walking longer periods of time at work and for hiking and lifting grandkids  Baseline: 06/17/2021 IN PROGRESS  4 FOTO score of knees to increase to 65% and shoulder to at least 64% indicating improved function with less pain.  Baseline: 06/17/2021 IN PROGRESS  5  R shoulder flexion and abduction to increase to at least 130 degrees to allow pt to reach into overhead cabinets. Baseline: 06/17/2021 IN PROGRESS    PLAN: PT FREQUENCY: 2x/week   PT DURATION: 8 weeks   PLANNED INTERVENTIONS: Therapeutic exercises, Therapeutic activity, Neuro Muscular re-education, Balance training, Gait training, Patient/Family education, Joint mobilization, Stair training, Aquatic Therapy, Dry Needling, Electrical stimulation, Cryotherapy, Moist heat, Taping, Vasopneumatic device, Ultrasound, Ionotophoresis 12m/ml Dexamethasone, and Manual therapy   PLAN FOR NEXT SESSION: KX Modifier Needed.  Strengthening, flexibility    SJuel Burrow PT 04/28/2021, 8:48 AM  [facility] SDurantGNiar[place] [address]Phone # [phone] [phone]

## 2021-04-30 ENCOUNTER — Ambulatory Visit: Payer: Medicare Other | Attending: Family Medicine | Admitting: Rehabilitative and Restorative Service Providers"

## 2021-04-30 ENCOUNTER — Encounter: Payer: Self-pay | Admitting: Rehabilitative and Restorative Service Providers"

## 2021-04-30 ENCOUNTER — Other Ambulatory Visit: Payer: Self-pay

## 2021-04-30 DIAGNOSIS — M6281 Muscle weakness (generalized): Secondary | ICD-10-CM | POA: Diagnosis present

## 2021-04-30 DIAGNOSIS — M25552 Pain in left hip: Secondary | ICD-10-CM | POA: Diagnosis present

## 2021-04-30 DIAGNOSIS — M25562 Pain in left knee: Secondary | ICD-10-CM

## 2021-04-30 DIAGNOSIS — M545 Low back pain, unspecified: Secondary | ICD-10-CM | POA: Diagnosis present

## 2021-04-30 DIAGNOSIS — M25561 Pain in right knee: Secondary | ICD-10-CM | POA: Diagnosis present

## 2021-04-30 DIAGNOSIS — R262 Difficulty in walking, not elsewhere classified: Secondary | ICD-10-CM | POA: Diagnosis present

## 2021-04-30 DIAGNOSIS — G8929 Other chronic pain: Secondary | ICD-10-CM | POA: Insufficient documentation

## 2021-04-30 DIAGNOSIS — M25551 Pain in right hip: Secondary | ICD-10-CM | POA: Diagnosis present

## 2021-04-30 DIAGNOSIS — M25511 Pain in right shoulder: Secondary | ICD-10-CM | POA: Insufficient documentation

## 2021-04-30 NOTE — Therapy (Signed)
OUTPATIENT PHYSICAL THERAPY TREATMENT NOTE   Patient Name: Noah Lane MRN: [id number] DOB:June 12, 1955, 66 y.o., male Today's Date: 04/30/2021  PCP: Noah Pal, DO REFERRING PROVIDER: Dr Noah Lane   PT End of Session - 04/30/21 0734     Visit Number 19    Date for PT Re-Evaluation 06/19/21    Authorization Type Medicare 20th visit progress note    PT Start Time 0730    PT Stop Time 0800    PT Time Calculation (min) 30 min    Activity Tolerance Patient tolerated treatment well    Behavior During Therapy [facility] for tasks assessed/performed             Past Medical History:  Diagnosis Date   Diabetes mellitus without complication ([place])    Past Surgical History:  Procedure Laterality Date   HERNIA REPAIR  2012   Patient Active Problem List   Diagnosis Date Noted   Patellofemoral pain syndrome of both knees 02/24/2021   Rotator cuff tendinitis, right 12/28/2019   Hip pain, bilateral 05/28/2019   Chronic bilateral low back pain without sciatica 05/28/2019   Diabetes mellitus type 2 in obese ([place]) 05/28/2019   Erectile dysfunction 05/28/2019   Pain and swelling of left knee 03/12/2019   Lateral epicondylitis, right elbow 03/15/2017    REFERRING DIAG: Rotator cuff tendinitis, right. bil patellofemoral pain    THERAPY DIAG:  Chronic pain of left knee  Chronic pain of right knee  Muscle weakness (generalized)  Difficulty in walking, not elsewhere classified  Acute bilateral low back pain without sciatica  Pain in right hip  Pain in left hip  Chronic right shoulder pain  PERTINENT HISTORY: DM with neuropathy, chronic LBP/hip pain  PRECAUTIONS: None  SUBJECTIVE: Pt reports that his knees and hips are feeling better, but his shoulder is a nagging pain.  PAIN:  Are you having pain? Yes Reports R shoulder pain of 7/10, describes nagging sensation   OBJECTIVE:    DIAGNOSTIC FINDINGS:  Shoulder MRI on 03/15/21:  1. Moderate anterior  greater than mid AP dimension of the infraspinatus tendinosis. Small partial-thickness midsubstance tears of the anterior infraspinatus tendon both proximally and distally. The distal anterior infraspinatus tendon tear minimally extends through the bursal tendon surface. 2. Mild superior subscapularis tendinosis. 3. 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 Type 3 acromion. 4. Mild subacromial/subdeltoid bursitis. 5. Mild-to-moderate glenohumeral cartilage degenerative changes with posteroinferior glenoid labrum likely degenerative tear.   PATIENT SURVEYS:  FOTO :  Shoulder 60%, Knee 65%   COGNITION:          Overall cognitive status: Within functional limits for tasks assessed   POSTURE: Forward head, rounded shoulder   UPPER EXTREMITY AROM/PROM:   A/PROM Right 04/22/2021 Left 04/22/2021  Shoulder flexion 130 150  Shoulder extension      Shoulder abduction 120 135  Shoulder adduction      Shoulder internal rotation      Shoulder external rotation      Elbow flexion      Elbow extension      Wrist flexion      Wrist extension      Wrist ulnar deviation      Wrist radial deviation      Wrist pronation      Wrist supination      (Blank rows = not tested)   UPPER EXTREMITY MMT:   04/22/2021 R shoulder strength of grossly 4 to 4+/5 throughout.  R hip strength of 4/5, R hamstring  of 4/5, R quad of 4+/5.            TODAY'S TREATMENT:    04/30/2021: Nustep L5 x4 min with PT present to discuss status Leg press Seat 9: BLE 80# 3x10, Single leg 50# 20x Bilat Seated row 30# 3x10 Lat Pull 40# 2x15 Sit to/from stand holding 10# x10 with chest press, x10 with overhead press Shoulder ER and horizontal abduction 2x10 Counter push ups 2x10  04/28/2021: Nustep L6 x5 min with PT present to discuss status Leg press Seat 9: BLE 75# 3x10, Single leg 50# 20x Bil Seated row 30# 3x10 Lat Pull 40# 2x15 Hamstring stretch and hip flexor stretch 2 x 20 sec each  bilat Sit to/from stand holding 10# x10 with chest press, x10 with overhead press Step up fwd and lateral 6" step x10 each bilat   04/24/2021: Nustep L2 5 min Sit Stand 15#  KB upright row 3x10 Seated row 30# 3x10 Hamstring and hip flexor stretch on stairs Bil Leg press Seat 9: BLE 75# 3x10, Single leg 50# 20x Bil Lat Pull 40# 2x15 Modified down dog with chair 5x     PATIENT EDUCATION: Education details: Pt educated on the importance of continued HEP. Person educated: Patient Education method: Explanation Education comprehension: verbalized understanding     HOME EXERCISE PROGRAM: Access Code: [email or web address] URL: [email or web address] Date: 04/22/2021 Prepared by: Noah Lane   Exercises Hip Flexor Stretch on Step - 1 x daily - 7 x weekly - 1 sets - 10 reps Standing Quad Stretch with Table and Chair Support - 2 x daily - 7 x weekly - 1 sets - 3 reps - 30 sec hold Long Sitting Quad Set - 2 x daily - 7 x weekly - 1 sets - 20 reps - 2-3 sec hold Supine Knee Extension Strengthening - 2 x daily - 7 x weekly - 1 sets - 20 reps Small Range Straight Leg Raise - 2 x daily - 7 x weekly - 1 sets - 20 reps Supine Bridge - 1 x daily - 7 x weekly - 1 sets - 10 reps Hooklying Isometric Hip Flexion - 1 x daily - 7 x weekly - 1 sets - 10 reps - 5 hold Sidelying Hip Abduction - 1 x daily - 7 x weekly - 1 sets - 10 reps Sit to Stand Without Arm Support - 1 x daily - 7 x weekly - 1 sets - 10 reps Standing Shoulder Row with Anchored Resistance - 1-2 x daily - 7 x weekly - 2 sets - 10 reps Shoulder extension with resistance - Neutral - 1-2 x daily - 7 x weekly - 2 sets - 10 reps Wall Push Up - 1-2 x daily - 7 x weekly - 2 sets - 10 reps Sit to Stand Without Arm Support - 1 x daily - 7 x weekly - 2 sets - 10 reps     ASSESSMENT:   CLINICAL IMPRESSION: Noah Lane continues to progress and is agreeable to treatment session today.  Pt states that he did use his nitro patch on his  shoulder and states that he is unsure if he felt any difference.  Pt reports following there ex that his shoulder felt better and more loose.  Pt requires min cuing throughout for posture and technique.  Pt continues to require skilled PT to progress towards goal related activities.     OBJECTIVE IMPAIRMENTS decreased activity tolerance, decreased balance, difficulty walking, decreased ROM, decreased strength, increased muscle spasms, impaired flexibility,  impaired UE functional use, improper body mechanics, postural dysfunction, and pain.    ACTIVITY LIMITATIONS community activity and yard work.    PERSONAL FACTORS 1-2 comorbidities: DM with neuropathy; chronic LBP/hip pain  are also affecting patient's functional outcome.      REHAB POTENTIAL: Good   CLINICAL DECISION MAKING: Evolving/moderate complexity   EVALUATION COMPLEXITY: Moderate     GOALS: Goals reviewed with patient? Yes   SHORT TERM GOALS:   STG Name Target Date Goal status  1 Pt will be I and compliant with initial HEP.  Baseline:  05/06/2021 MET  2 Patient will have increased HS length to 65 degrees bil and hip extension to 10 degrees bil  Baseline:  05/06/2021 MET  3 The patient will rise sit to stand 5x in 16 sec or less  Baseline: 05/06/2021 MET  4 Pt will report ability to walk 1/2 mile with pain/discomfort 3/10 or less  Baseline: 05/06/2021 MET    LONG TERM GOALS:    LTG Name Target Date Goal status  1 The patient will be independent in a safe self progression of HEP  Baseline: 06/17/2021 IN PROGRESS  2 Pt will report ability to walk dogs for at least 25 minutes at a time or 1 mile with pain 3/10 or less  Baseline: 06/17/2021 IN PROGRESS  3 Bil knee and shoulder strength improved to 4+/5 and hip strength to 4/5 needed for standing/walking longer periods of time at work and for hiking and lifting grandkids  Baseline: 06/17/2021 IN PROGRESS  4 FOTO score of knees to increase to 65% and shoulder to at least 64%  indicating improved function with less pain.  Baseline: 06/17/2021 IN PROGRESS  5 R shoulder flexion and abduction to increase to at least 130 degrees to allow pt to reach into overhead cabinets. Baseline: 06/17/2021 IN PROGRESS    PLAN: PT FREQUENCY: 2x/week   PT DURATION: 8 weeks   PLANNED INTERVENTIONS: Therapeutic exercises, Therapeutic activity, Neuro Muscular re-education, Balance training, Gait training, Patient/Family education, Joint mobilization, Stair training, Aquatic Therapy, Dry Needling, Electrical stimulation, Cryotherapy, Moist heat, Taping, Vasopneumatic device, Ultrasound, Ionotophoresis 2m/ml Dexamethasone, and Manual therapy   PLAN FOR NEXT SESSION: KX Modifier Needed.  Strengthening, flexibility    SJuel Burrow PT 04/30/2021, 8:01 AM  [facility] [place]ville[place]outh [place] [address]Phone # [phone] [phone]

## 2021-05-07 ENCOUNTER — Ambulatory Visit: Payer: Medicare Other | Admitting: Rehabilitative and Restorative Service Providers"

## 2021-05-07 ENCOUNTER — Encounter: Payer: Self-pay | Admitting: Rehabilitative and Restorative Service Providers"

## 2021-05-07 ENCOUNTER — Other Ambulatory Visit: Payer: Self-pay

## 2021-05-07 DIAGNOSIS — M25552 Pain in left hip: Secondary | ICD-10-CM

## 2021-05-07 DIAGNOSIS — M25562 Pain in left knee: Secondary | ICD-10-CM

## 2021-05-07 DIAGNOSIS — M545 Low back pain, unspecified: Secondary | ICD-10-CM

## 2021-05-07 DIAGNOSIS — G8929 Other chronic pain: Secondary | ICD-10-CM

## 2021-05-07 DIAGNOSIS — M25561 Pain in right knee: Secondary | ICD-10-CM

## 2021-05-07 DIAGNOSIS — M6281 Muscle weakness (generalized): Secondary | ICD-10-CM

## 2021-05-07 DIAGNOSIS — M25551 Pain in right hip: Secondary | ICD-10-CM

## 2021-05-07 DIAGNOSIS — R262 Difficulty in walking, not elsewhere classified: Secondary | ICD-10-CM

## 2021-05-07 NOTE — Therapy (Signed)
OUTPATIENT PHYSICAL THERAPY TREATMENT NOTE   Patient Name: Noah Lane MRN: [id number] DOB:[birth year], 66 y.o., male Today's Date: 05/07/2021  PCP: [name], DO REFERRING PROVIDER: Dr Clearance Coots   PT End of Session - 05/07/21 0801     Visit Number 20    Date for PT Re-Evaluation 06/19/21    Authorization Type Medicare 30th visit progress note    PT Start Time 0750    PT Stop Time 0843    PT Time Calculation (min) 53 min    Activity Tolerance Patient tolerated treatment well    Behavior During Therapy [facility] for tasks assessed/performed             Past Medical History:  Diagnosis Date   Diabetes mellitus without complication ([place])    Past Surgical History:  Procedure Laterality Date   HERNIA REPAIR  2012   Patient Active Problem List   Diagnosis Date Noted   Patellofemoral pain syndrome of both knees 02/24/2021   Rotator cuff tendinitis, right 12/28/2019   Hip pain, bilateral 05/28/2019   Chronic bilateral low back pain without sciatica 05/28/2019   Diabetes mellitus type 2 in obese ([place]) 05/28/2019   Erectile dysfunction 05/28/2019   Pain and swelling of left knee 03/12/2019   Lateral epicondylitis, right elbow 03/15/2017    REFERRING DIAG: Rotator cuff tendinitis, right. bil patellofemoral pain    THERAPY DIAG:  Chronic pain of left knee  Chronic pain of right knee  Muscle weakness (generalized)  Difficulty in walking, not elsewhere classified  Acute bilateral low back pain without sciatica  Pain in right hip  Pain in left hip  Chronic right shoulder pain  PERTINENT HISTORY: DM with neuropathy, chronic LBP/hip pain  PRECAUTIONS: None  SUBJECTIVE: Pt reports that his knees and hips are feeling better, but his shoulder is a nagging pain.  PAIN:  Are you having pain? Yes Reports R shoulder pain of 7/10, describes nagging sensation   OBJECTIVE:    DIAGNOSTIC FINDINGS:  Shoulder MRI on 03/15/21:  1. Moderate anterior  greater than mid AP dimension of the infraspinatus tendinosis. Small partial-thickness midsubstance tears of the anterior infraspinatus tendon both proximally and distally. The distal anterior infraspinatus tendon tear minimally extends through the bursal tendon surface. 2. Mild superior subscapularis tendinosis. 3. 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 Type 3 acromion. 4. Mild subacromial/subdeltoid bursitis. 5. Mild-to-moderate glenohumeral cartilage degenerative changes with posteroinferior glenoid labrum likely degenerative tear.   PATIENT SURVEYS:  FOTO :  Shoulder 60%, Knee 65%   COGNITION:          Overall cognitive status: Within functional limits for tasks assessed   POSTURE: Forward head, rounded shoulder   UPPER EXTREMITY AROM/PROM:   A/PROM Right 04/22/2021 Left 04/22/2021  Shoulder flexion 130 150  Shoulder extension      Shoulder abduction 120 135  Shoulder adduction      Shoulder internal rotation      Shoulder external rotation      Elbow flexion      Elbow extension      Wrist flexion      Wrist extension      Wrist ulnar deviation      Wrist radial deviation      Wrist pronation      Wrist supination      (Blank rows = not tested)   UPPER EXTREMITY MMT:   04/22/2021 R shoulder strength of grossly 4 to 4+/5 throughout.  R hip strength of 4/5, R hamstring  of 4/5, R quad of 4+/5.            TODAY'S TREATMENT:    05/07/2021: Nustep L5 x8 min with PT present to discuss status Seated crunches and oblique crunches with 7# x10 each Modified snatch and press with 7# x10 bilat Shoulder ER and horizontal abduction with green tband 2x10 Lat Pull 40# 2x15 Standing Hamstring stretch with one foot on step 2x20 sec bilat Standing hip flexor stretch with one foot on step 2x20 sec bilat Seated piriformis stretch 2x20 sec Seated row 30# 3x10 Leg press Seat 9: BLE 90# 3x10, Single leg 50# 20x Bilat  04/30/2021: Nustep L5 x4 min with PT  present to discuss status Leg press Seat 9: BLE 80# 3x10, Single leg 50# 20x Bilat Seated row 30# 3x10 Lat Pull 40# 2x15 Sit to/from stand holding 10# x10 with chest press, x10 with overhead press Shoulder ER and horizontal abduction 2x10 Counter push ups 2x10  04/28/2021: Nustep L6 x5 min with PT present to discuss status Leg press Seat 9: BLE 75# 3x10, Single leg 50# 20x Bil Seated row 30# 3x10 Lat Pull 40# 2x15 Hamstring stretch and hip flexor stretch 2 x 20 sec each bilat Sit to/from stand holding 10# x10 with chest press, x10 with overhead press Step up fwd and lateral 6" step x10 each bilat     PATIENT EDUCATION: Education details: Pt educated on the importance of continued HEP. Person educated: Patient Education method: Explanation Education comprehension: verbalized understanding     HOME EXERCISE PROGRAM: Access Code: [email or web address] URL: [email or web address] Date: 04/22/2021 Prepared by: Shelby Dubin Myrah Strawderman   Exercises Hip Flexor Stretch on Step - 1 x daily - 7 x weekly - 1 sets - 10 reps Standing Quad Stretch with Table and Chair Support - 2 x daily - 7 x weekly - 1 sets - 3 reps - 30 sec hold Long Sitting Quad Set - 2 x daily - 7 x weekly - 1 sets - 20 reps - 2-3 sec hold Supine Knee Extension Strengthening - 2 x daily - 7 x weekly - 1 sets - 20 reps Small Range Straight Leg Raise - 2 x daily - 7 x weekly - 1 sets - 20 reps Supine Bridge - 1 x daily - 7 x weekly - 1 sets - 10 reps Hooklying Isometric Hip Flexion - 1 x daily - 7 x weekly - 1 sets - 10 reps - 5 hold Sidelying Hip Abduction - 1 x daily - 7 x weekly - 1 sets - 10 reps Sit to Stand Without Arm Support - 1 x daily - 7 x weekly - 1 sets - 10 reps Standing Shoulder Row with Anchored Resistance - 1-2 x daily - 7 x weekly - 2 sets - 10 reps Shoulder extension with resistance - Neutral - 1-2 x daily - 7 x weekly - 2 sets - 10 reps Wall Push Up - 1-2 x daily - 7 x weekly - 2 sets - 10 reps Sit to Stand  Without Arm Support - 1 x daily - 7 x weekly - 2 sets - 10 reps     ASSESSMENT:   CLINICAL IMPRESSION: Noah Lane continues to progress and is agreeable to treatment session today.  Pt states that he can tell that he is getting better, as he does not feel as fatigued with performing sit to/from stand transfer from sofa.  Pt requires min cuing throughout for posture and body mechanics. Review of LE stretching today that pt is  performing as part of his HEP.  Pt able to return demonstration. Pt tolerated increased weights without complaints.  Pt continues to progress towards goal related activities and decreased overall pain.   OBJECTIVE IMPAIRMENTS decreased activity tolerance, decreased balance, difficulty walking, decreased ROM, decreased strength, increased muscle spasms, impaired flexibility, impaired UE functional use, improper body mechanics, postural dysfunction, and pain.    ACTIVITY LIMITATIONS community activity and yard work.    PERSONAL FACTORS 1-2 comorbidities: DM with neuropathy; chronic LBP/hip pain  are also affecting patient's functional outcome.      REHAB POTENTIAL: Good   CLINICAL DECISION MAKING: Evolving/moderate complexity   EVALUATION COMPLEXITY: Moderate     GOALS: Goals reviewed with patient? Yes   SHORT TERM GOALS:   STG Name Target Date Goal status  1 Pt will be I and compliant with initial HEP.  Baseline:  05/06/2021 MET  2 Patient will have increased HS length to 65 degrees bil and hip extension to 10 degrees bil  Baseline:  05/06/2021 MET  3 The patient will rise sit to stand 5x in 16 sec or less  Baseline: 05/06/2021 MET  4 Pt will report ability to walk 1/2 mile with pain/discomfort 3/10 or less  Baseline: 05/06/2021 MET    LONG TERM GOALS:    LTG Name Target Date Goal status  1 The patient will be independent in a safe self progression of HEP  Baseline: 06/17/2021 IN PROGRESS  2 Pt will report ability to walk dogs for at least 25 minutes at a time or  1 mile with pain 3/10 or less  Baseline: 06/17/2021 IN PROGRESS  3 Bil knee and shoulder strength improved to 4+/5 and hip strength to 4/5 needed for standing/walking longer periods of time at work and for hiking and lifting grandkids  Baseline: 06/17/2021 IN PROGRESS  4 FOTO score of knees to increase to 65% and shoulder to at least 64% indicating improved function with less pain.  Baseline: 06/17/2021 IN PROGRESS  5 R shoulder flexion and abduction to increase to at least 130 degrees to allow pt to reach into overhead cabinets. Baseline: 06/17/2021 IN PROGRESS    PLAN: PT FREQUENCY: 2x/week   PT DURATION: 8 weeks   PLANNED INTERVENTIONS: Therapeutic exercises, Therapeutic activity, Neuro Muscular re-education, Balance training, Gait training, Patient/Family education, Joint mobilization, Stair training, Aquatic Therapy, Dry Needling, Electrical stimulation, Cryotherapy, Moist heat, Taping, Vasopneumatic device, Ultrasound, Ionotophoresis 4m/ml Dexamethasone, and Manual therapy   PLAN FOR NEXT SESSION: KX Modifier Needed.  Strengthening, flexibility    SJuel Burrow PT 05/07/2021, 8:50 AM  [facility] [place] Monroe[place]ard[place] [address]Phone # [phone] [phone]

## 2021-05-11 ENCOUNTER — Other Ambulatory Visit: Payer: Self-pay

## 2021-05-11 ENCOUNTER — Ambulatory Visit: Payer: Medicare Other | Admitting: Physical Therapy

## 2021-05-11 ENCOUNTER — Encounter: Payer: Self-pay | Admitting: Physical Therapy

## 2021-05-11 DIAGNOSIS — M6281 Muscle weakness (generalized): Secondary | ICD-10-CM

## 2021-05-11 DIAGNOSIS — M25562 Pain in left knee: Secondary | ICD-10-CM | POA: Diagnosis not present

## 2021-05-11 DIAGNOSIS — G8929 Other chronic pain: Secondary | ICD-10-CM

## 2021-05-11 DIAGNOSIS — M545 Low back pain, unspecified: Secondary | ICD-10-CM

## 2021-05-11 DIAGNOSIS — M25552 Pain in left hip: Secondary | ICD-10-CM

## 2021-05-11 DIAGNOSIS — M25551 Pain in right hip: Secondary | ICD-10-CM

## 2021-05-11 DIAGNOSIS — R262 Difficulty in walking, not elsewhere classified: Secondary | ICD-10-CM

## 2021-05-11 NOTE — Therapy (Signed)
OUTPATIENT PHYSICAL THERAPY TREATMENT NOTE   Patient Name: Noah Lane MRN: [id number] DOB:23-Jan-1956, 66 y.o., male Today's Date: 05/11/2021  PCP: [name], DO REFERRING PROVIDER: Dr Clearance Coots   PT End of Session - 05/11/21 0801     Visit Number 21    Date for PT Re-Evaluation 06/19/21    Authorization Type Medicare 30th visit progress note    PT Start Time 0800    Activity Tolerance Patient tolerated treatment well    Behavior During Therapy [facility] for tasks assessed/performed             Past Medical History:  Diagnosis Date   Diabetes mellitus without complication [facility])    Past Surgical History:  Procedure Laterality Date   HERNIA REPAIR  2012   Patient Active Problem List   Diagnosis Date Noted   Patellofemoral pain syndrome of both knees 02/24/2021   Rotator cuff tendinitis, right 12/28/2019   Hip pain, bilateral 05/28/2019   Chronic bilateral low back pain without sciatica 05/28/2019   Diabetes mellitus type 2 in obese ([place]) 05/28/2019   Erectile dysfunction 05/28/2019   Pain and swelling of left knee 03/12/2019   Lateral epicondylitis, right elbow 03/15/2017    REFERRING DIAG: Rotator cuff tendinitis, right. bil patellofemoral pain    THERAPY DIAG:  Chronic pain of left knee  Chronic pain of right knee  Muscle weakness (generalized)  Difficulty in walking, not elsewhere classified  Acute bilateral low back pain without sciatica  Pain in right hip  Pain in left hip  Chronic right shoulder pain  PERTINENT HISTORY: DM with neuropathy, chronic LBP/hip pain  PRECAUTIONS: None  SUBJECTIVE: Doing the eliptical regularly, overall I am doing much better. Shoulder still aggrevates him in the evening.  PAIN:  Are you having pain? No    OBJECTIVE:    DIAGNOSTIC FINDINGS:  Shoulder MRI on 03/15/21:  1. Moderate anterior greater than mid AP dimension of the infraspinatus tendinosis. Small partial-thickness midsubstance  tears of the anterior infraspinatus tendon both proximally and distally. The distal anterior infraspinatus tendon tear minimally extends through the bursal tendon surface. 2. Mild superior subscapularis tendinosis. 3. 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 Type 3 acromion. 4. Mild subacromial/subdeltoid bursitis. 5. Mild-to-moderate glenohumeral cartilage degenerative changes with posteroinferior glenoid labrum likely degenerative tear.   PATIENT SURVEYS:  FOTO :  Shoulder 60%, Knee 65%   COGNITION:          Overall cognitive status: Within functional limits for tasks assessed   POSTURE: Forward head, rounded shoulder   UPPER EXTREMITY AROM/PROM:   A/PROM Right 04/22/2021 Left 04/22/2021  Shoulder flexion 130 150  Shoulder extension      Shoulder abduction 120 135  Shoulder adduction      Shoulder internal rotation      Shoulder external rotation      Elbow flexion      Elbow extension      Wrist flexion      Wrist extension      Wrist ulnar deviation      Wrist radial deviation      Wrist pronation      Wrist supination      (Blank rows = not tested)   UPPER EXTREMITY MMT:   04/22/2021 R shoulder strength of grossly 4 to 4+/5 throughout.  R hip strength of 4/5, R hamstring of 4/5, R quad of 4+/5.            TODAY'S TREATMENT:    05/11/21: Venida Jarvis  L5 x 10 min Leg Press Bil 90# 2x10, single leg 50# 2x10 both seat 9 Lat Pull 45# 2x10 Seated hamstring stretch BIl 2x20 sec Seated row 35# 2x15 Seated reverse core hinges holding 8# wt 2x10 Spine twist in small hinge holding 8# wt 2x5 Bil shoulder green band E/IR 2x10 Sit to stand with 15# KB 10x 90 degree kitchen counter stretch 20 sec   05/07/2021: Nustep L5 x8 min with PT present to discuss status Seated crunches and oblique crunches with 7# x10 each Modified snatch and press with 7# x10 bilat Shoulder ER and horizontal abduction with green tband 2x10 Lat Pull 40# 2x15 Standing Hamstring  stretch with one foot on step 2x20 sec bilat Standing hip flexor stretch with one foot on step 2x20 sec bilat Seated piriformis stretch 2x20 sec Seated row 30# 3x10 Leg press Seat 9: BLE 90# 3x10, Single leg 50# 20x Bilat  04/30/2021: Nustep L5 x4 min with PT present to discuss status Leg press Seat 9: BLE 80# 3x10, Single leg 50# 20x Bilat Seated row 30# 3x10 Lat Pull 40# 2x15 Sit to/from stand holding 10# x10 with chest press, x10 with overhead press Shoulder ER and horizontal abduction 2x10 Counter push ups 2x10  04/28/2021: Nustep L6 x5 min with PT present to discuss status Leg press Seat 9: BLE 75# 3x10, Single leg 50# 20x Bil Seated row 30# 3x10 Lat Pull 40# 2x15 Hamstring stretch and hip flexor stretch 2 x 20 sec each bilat Sit to/from stand holding 10# x10 with chest press, x10 with overhead press Step up fwd and lateral 6" step x10 each bilat     PATIENT EDUCATION: Education details: Pt educated on the importance of continued HEP. Person educated: Patient Education method: Explanation Education comprehension: verbalized understanding     HOME EXERCISE PROGRAM: Access Code: [email or web address] URL: [email or web address] Date: 04/22/2021 Prepared by: Shelby Dubin Menke   Exercises Hip Flexor Stretch on Step - 1 x daily - 7 x weekly - 1 sets - 10 reps Standing Quad Stretch with Table and Chair Support - 2 x daily - 7 x weekly - 1 sets - 3 reps - 30 sec hold Long Sitting Quad Set - 2 x daily - 7 x weekly - 1 sets - 20 reps - 2-3 sec hold Supine Knee Extension Strengthening - 2 x daily - 7 x weekly - 1 sets - 20 reps Small Range Straight Leg Raise - 2 x daily - 7 x weekly - 1 sets - 20 reps Supine Bridge - 1 x daily - 7 x weekly - 1 sets - 10 reps Hooklying Isometric Hip Flexion - 1 x daily - 7 x weekly - 1 sets - 10 reps - 5 hold Sidelying Hip Abduction - 1 x daily - 7 x weekly - 1 sets - 10 reps Sit to Stand Without Arm Support - 1 x daily - 7 x weekly - 1 sets - 10  reps Standing Shoulder Row with Anchored Resistance - 1-2 x daily - 7 x weekly - 2 sets - 10 reps Shoulder extension with resistance - Neutral - 1-2 x daily - 7 x weekly - 2 sets - 10 reps Wall Push Up - 1-2 x daily - 7 x weekly - 2 sets - 10 reps Sit to Stand Without Arm Support - 1 x daily - 7 x weekly - 2 sets - 10 reps     ASSESSMENT:   CLINICAL IMPRESSION: Mr Helming continues to progress and is agreeable to treatment session  today.  Pt states that he can tell that he is getting better, as he does not feel as fatigued with performing sit to/from stand transfer from sofa.  Pt requires min cuing throughout for posture and body mechanics. Pt able to return demonstration. Pt tolerated increased weights without complaints.  Pt continues to progress towards goal related activities and decreased overall pain. Main complaint remains evening shoulder pain.    OBJECTIVE IMPAIRMENTS decreased activity tolerance, decreased balance, difficulty walking, decreased ROM, decreased strength, increased muscle spasms, impaired flexibility, impaired UE functional use, improper body mechanics, postural dysfunction, and pain.    ACTIVITY LIMITATIONS community activity and yard work.    PERSONAL FACTORS 1-2 comorbidities: DM with neuropathy; chronic LBP/hip pain  are also affecting patient's functional outcome.      REHAB POTENTIAL: Good   CLINICAL DECISION MAKING: Evolving/moderate complexity   EVALUATION COMPLEXITY: Moderate     GOALS: Goals reviewed with patient? Yes   SHORT TERM GOALS:   STG Name Target Date Goal status  1 Pt will be I and compliant with initial HEP.  Baseline:  05/06/2021 MET  2 Patient will have increased HS length to 65 degrees bil and hip extension to 10 degrees bil  Baseline:  05/06/2021 MET  3 The patient will rise sit to stand 5x in 16 sec or less  Baseline: 05/06/2021 MET  4 Pt will report ability to walk 1/2 mile with pain/discomfort 3/10 or less  Baseline: 05/06/2021 MET     LONG TERM GOALS:    LTG Name Target Date Goal status  1 The patient will be independent in a safe self progression of HEP  Baseline: 06/17/2021 IN PROGRESS  2 Pt will report ability to walk dogs for at least 25 minutes at a time or 1 mile with pain 3/10 or less  Baseline: 06/17/2021 IN PROGRESS  3 Bil knee and shoulder strength improved to 4+/5 and hip strength to 4/5 needed for standing/walking longer periods of time at work and for hiking and lifting grandkids  Baseline: 06/17/2021 IN PROGRESS  4 FOTO score of knees to increase to 65% and shoulder to at least 64% indicating improved function with less pain.  Baseline: 06/17/2021 IN PROGRESS  5 R shoulder flexion and abduction to increase to at least 130 degrees to allow pt to reach into overhead cabinets. Baseline: 06/17/2021 IN PROGRESS    PLAN: PT FREQUENCY: 2x/week   PT DURATION: 8 weeks   PLANNED INTERVENTIONS: Therapeutic exercises, Therapeutic activity, Neuro Muscular re-education, Balance training, Gait training, Patient/Family education, Joint mobilization, Stair training, Aquatic Therapy, Dry Needling, Electrical stimulation, Cryotherapy, Moist heat, Taping, Vasopneumatic device, Ultrasound, Ionotophoresis 49m/ml Dexamethasone, and Manual therapy   PLAN FOR NEXT SESSION: KX Modifier Needed.  Potential DC next session.    CPetroleum PTA 05/11/2021, 8:01 AM  BLargo [facility] SFredoniaGEmerson [place] [address]Phone # [phone] [phone]

## 2021-05-14 ENCOUNTER — Ambulatory Visit: Payer: Medicare Other | Admitting: Rehabilitative and Restorative Service Providers"

## 2021-05-14 ENCOUNTER — Encounter: Payer: Self-pay | Admitting: Rehabilitative and Restorative Service Providers"

## 2021-05-14 ENCOUNTER — Other Ambulatory Visit: Payer: Self-pay

## 2021-05-14 DIAGNOSIS — M25562 Pain in left knee: Secondary | ICD-10-CM | POA: Diagnosis not present

## 2021-05-14 NOTE — Therapy (Signed)
?OUTPATIENT PHYSICAL THERAPY TREATMENT NOTE ? ? ?Patient Name: Noah Lane ?MRN: [id number] ?DOB:[birth year], 66 y.o., male ?Today's Date: 05/14/2021 ? ?PCP: [name], DO ?REFERRING PROVIDER: Dr Clearance Coots ? ? PT End of Session - 05/14/21 0745   ? ? Visit Number 22   ? Date for PT Re-Evaluation 06/19/21   ? Authorization Type Medicare 30th visit progress note   ? PT Start Time 0740   ? PT Stop Time 0800   ? PT Time Calculation (min) 20 min   ? Activity Tolerance Patient tolerated treatment well   ? Behavior During Therapy [facility] for tasks assessed/performed   ? ?  ?  ? ?  ? ? ?Past Medical History:  ?Diagnosis Date  ? Diabetes mellitus without complication ([place])   ? ?Past Surgical History:  ?Procedure Laterality Date  ? HERNIA REPAIR  2012  ? ?Patient Active Problem List  ? Diagnosis Date Noted  ? Patellofemoral pain syndrome of both knees 02/24/2021  ? Rotator cuff tendinitis, right 12/28/2019  ? Hip pain, bilateral 05/28/2019  ? Chronic bilateral low back pain without sciatica 05/28/2019  ? Diabetes mellitus type 2 in obese ([place]) 05/28/2019  ? Erectile dysfunction 05/28/2019  ? Pain and swelling of left knee 03/12/2019  ? Lateral epicondylitis, right elbow 03/15/2017  ? ? ?REFERRING DIAG: Rotator cuff tendinitis, right. bil patellofemoral pain ?  ? ?THERAPY DIAG:  ?Chronic pain of left knee ? ?Chronic pain of right knee ? ?Muscle weakness (generalized) ? ?Difficulty in walking, not elsewhere classified ? ?Acute bilateral low back pain without sciatica ? ?Pain in right hip ? ?Pain in left hip ? ?Chronic right shoulder pain ? ?PERTINENT HISTORY: DM with neuropathy, chronic LBP/hip pain ? ?PRECAUTIONS: None ? ?SUBJECTIVE: Pt reports some shoulder pain this AM.  States that he has not had time to warm up this morning. ? ?PAIN:  ?Are you having pain? Yes ?NPRS scale: 6/10 ?Pain location: Right Shoulder ?PAIN TYPE: aching ?Pain description: intermittent  ? ?OBJECTIVE:  ?  ?DIAGNOSTIC FINDINGS:   ?Shoulder MRI on 03/15/21:  ?1. Moderate anterior greater than mid AP dimension of the ?infraspinatus tendinosis. Small partial-thickness midsubstance tears ?of the anterior infraspinatus tendon both proximally and distally. ?The distal anterior infraspinatus tendon tear minimally extends ?through the bursal tendon surface. ?2. Mild superior subscapularis tendinosis. ?3. 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 ?Type 3 acromion. ?4. Mild subacromial/subdeltoid bursitis. ?5. Mild-to-moderate glenohumeral cartilage degenerative changes with ?posteroinferior glenoid labrum likely degenerative tear. ?  ?PATIENT SURVEYS:  ?FOTO :  Shoulder 60%, Knee 65% ?  ?COGNITION: ?         Overall cognitive status: Within functional limits for tasks assessed ?  ?POSTURE: ?Forward head, rounded shoulder ?  ?UPPER EXTREMITY AROM/PROM: ?  ?A/PROM Right ?04/22/2021 Left ?04/22/2021  ?Shoulder flexion 130 150  ?Shoulder extension      ?Shoulder abduction 120 135  ?Shoulder adduction      ?Shoulder internal rotation      ?Shoulder external rotation      ?Elbow flexion      ?Elbow extension      ?Wrist flexion      ?Wrist extension      ?Wrist ulnar deviation      ?Wrist radial deviation      ?Wrist pronation      ?Wrist supination      ?(Blank rows = not tested) ?  ?UPPER EXTREMITY MMT: ?  ?04/22/2021 ?R shoulder strength of grossly 4 to 4+/5 throughout.  R hip strength of 4/5, R hamstring of 4/5, R quad of 4+/5. ?           ?TODAY'S TREATMENT:  ?  ?05/14/2021: ?Nustep L7 x3 min with PT present to discuss status ?Leg Press Bil 100# 2x10, single leg 50# x10 both seat 9 ?Seated row 40# 2x10 ?Lat Pull 45# 2x10 ?Sit to/from stand holding 15# kettlebell:  x10 with chest press, x10 with overhead press ? ?05/11/2021: ?Nustep L5 x 10 min ?Leg Press Bil 90# 2x10, single leg 50# 2x10 both seat 9 ?Lat Pull 45# 2x10 ?Seated hamstring stretch BIl 2x20 sec ?Seated row 35# 2x15 ?Seated reverse core hinges holding 8# wt 2x10 ?Spine twist  in small hinge holding 8# wt 2x5 ?Bil shoulder green band E/IR 2x10 ?Sit to stand with 15# KB 10x ?90 degree kitchen counter stretch 20 sec  ? ?05/07/2021: ?Nustep L5 x8 min with PT present to discuss status ?Seated crunches and oblique crunches with 7# x10 each ?Modified snatch and press with 7# x10 bilat ?Shoulder ER and horizontal abduction with green tband 2x10 ?Lat Pull 40# 2x15 ?Standing Hamstring stretch with one foot on step 2x20 sec bilat ?Standing hip flexor stretch with one foot on step 2x20 sec bilat ?Seated piriformis stretch 2x20 sec ?Seated row 30# 3x10 ?Leg press Seat 9: BLE 90# 3x10, Single leg 50# 20x Bilat ? ? ?PATIENT EDUCATION: ?Education details: Pt educated on the importance of continued HEP. ?Person educated: Patient ?Education method: Explanation ?Education comprehension: verbalized understanding ?  ?  ?HOME EXERCISE PROGRAM: ?Access Code: [email or web address] ?URL: [email or web address] ?Date: 04/22/2021 ?Prepared by: Juel Burrow ?  ?Exercises ?Hip Flexor Stretch on Step - 1 x daily - 7 x weekly - 1 sets - 10 reps ?Standing Sports administrator with Table and Chair Support - 2 x daily - 7 x weekly - 1 sets - 3 reps - 30 sec hold ?Long Sitting Quad Set - 2 x daily - 7 x weekly - 1 sets - 20 reps - 2-3 sec hold ?Supine Knee Extension Strengthening - 2 x daily - 7 x weekly - 1 sets - 20 reps ?Small Range Straight Leg Raise - 2 x daily - 7 x weekly - 1 sets - 20 reps ?Supine Bridge - 1 x daily - 7 x weekly - 1 sets - 10 reps ?Hooklying Isometric Hip Flexion - 1 x daily - 7 x weekly - 1 sets - 10 reps - 5 hold ?Sidelying Hip Abduction - 1 x daily - 7 x weekly - 1 sets - 10 reps ?Sit to Stand Without Arm Support - 1 x daily - 7 x weekly - 1 sets - 10 reps ?Standing Shoulder Row with Anchored Resistance - 1-2 x daily - 7 x weekly - 2 sets - 10 reps ?Shoulder extension with resistance - Neutral - 1-2 x daily - 7 x weekly - 2 sets - 10 reps ?Wall Push Up - 1-2 x daily - 7 x weekly - 2 sets - 10 reps ?Sit  to Stand Without Arm Support - 1 x daily - 7 x weekly - 2 sets - 10 reps ?  ?  ?ASSESSMENT: ?  ?CLINICAL IMPRESSION: ?Mr Jablonowski arrives 10 min late for his appointment, so treatment limited during session.  Utilized Engineer, structural method secondary to limited time available.  Pt tolerated session well and without complaints of increased pain throughout.  Pt continues to utilize nitro patch on his R shoulder per MD instructions ?  ?OBJECTIVE IMPAIRMENTS decreased activity tolerance,  decreased balance, difficulty walking, decreased ROM, decreased strength, increased muscle spasms, impaired flexibility, impaired UE functional use, improper body mechanics, postural dysfunction, and pain.  ?  ?ACTIVITY LIMITATIONS community activity and yard work.  ?  ?PERSONAL FACTORS 1-2 comorbidities: DM with neuropathy; chronic LBP/hip pain  are also affecting patient's functional outcome.  ?  ?  ?REHAB POTENTIAL: Good ?  ?CLINICAL DECISION MAKING: Evolving/moderate complexity ?  ?EVALUATION COMPLEXITY: Moderate ?  ?  ?GOALS: ?Goals reviewed with patient? Yes ?  ?SHORT TERM GOALS: ?  ?STG Name Target Date Goal status  ?1 Pt will be I and compliant with initial HEP.  ?Baseline:  05/06/2021 MET  ?2 Patient will have increased HS length to 65 degrees bil and hip extension to 10 degrees bil  ?Baseline:  05/06/2021 MET  ?3 The patient will rise sit to stand 5x in 16 sec or less  ?Baseline: 05/06/2021 MET  ?4 Pt will report ability to walk 1/2 mile with pain/discomfort 3/10 or less  ?Baseline: 05/06/2021 MET  ?  ?LONG TERM GOALS:  ?  ?LTG Name Target Date Goal status  ?1 The patient will be independent in a safe self progression of HEP  ?Baseline: 06/17/2021 IN PROGRESS  ?2 Pt will report ability to walk dogs for at least 25 minutes at a time or 1 mile with pain 3/10 or less  ?Baseline: 06/17/2021 IN PROGRESS  ?3 Bil knee and shoulder strength improved to 4+/5 and hip strength to 4/5 needed for standing/walking longer periods of time at work and  for hiking and lifting grandkids  ?Baseline: 06/17/2021 IN PROGRESS  ?4 FOTO score of knees to increase to 65% and shoulder to at least 64% indicating improved function with less pain.  ?Baseline: 06/17/2021 IN

## 2021-05-20 ENCOUNTER — Encounter: Payer: Self-pay | Admitting: Rehabilitative and Restorative Service Providers"

## 2021-05-20 ENCOUNTER — Ambulatory Visit: Payer: Medicare Other | Admitting: Rehabilitative and Restorative Service Providers"

## 2021-05-20 ENCOUNTER — Other Ambulatory Visit: Payer: Self-pay

## 2021-05-20 DIAGNOSIS — M6281 Muscle weakness (generalized): Secondary | ICD-10-CM

## 2021-05-20 DIAGNOSIS — M25551 Pain in right hip: Secondary | ICD-10-CM

## 2021-05-20 DIAGNOSIS — G8929 Other chronic pain: Secondary | ICD-10-CM

## 2021-05-20 DIAGNOSIS — R262 Difficulty in walking, not elsewhere classified: Secondary | ICD-10-CM

## 2021-05-20 DIAGNOSIS — M25562 Pain in left knee: Secondary | ICD-10-CM | POA: Diagnosis not present

## 2021-05-20 DIAGNOSIS — M545 Low back pain, unspecified: Secondary | ICD-10-CM

## 2021-05-20 DIAGNOSIS — M25552 Pain in left hip: Secondary | ICD-10-CM

## 2021-05-20 NOTE — Therapy (Signed)
?OUTPATIENT PHYSICAL THERAPY TREATMENT NOTE ? ? ?Patient Name: Noah Lane ?MRN: [id number] ?DOB:04-06-1955, 66 y.o., male ?Today's Date: 05/20/2021 ? ?PCP: [name], DO ?REFERRING PROVIDER: Dr Clearance Coots ? ? PT End of Session - 05/20/21 0756   ? ? Visit Number 23   ? Date for PT Re-Evaluation 06/19/21   ? Authorization Type Medicare 30th visit progress note   ? PT Start Time [phone]   ? PT Stop Time 0840   ? PT Time Calculation (min) 43 min   ? Activity Tolerance Patient tolerated treatment well   ? Behavior During Therapy [facility] for tasks assessed/performed   ? ?  ?  ? ?  ? ? ?Past Medical History:  ?Diagnosis Date  ? Diabetes mellitus without complication ([place])   ? ?Past Surgical History:  ?Procedure Laterality Date  ? HERNIA REPAIR  2012  ? ?Patient Active Problem List  ? Diagnosis Date Noted  ? Patellofemoral pain syndrome of both knees 02/24/2021  ? Rotator cuff tendinitis, right 12/28/2019  ? Hip pain, bilateral 05/28/2019  ? Chronic bilateral low back pain without sciatica 05/28/2019  ? Diabetes mellitus type 2 in obese ([place]) 05/28/2019  ? Erectile dysfunction 05/28/2019  ? Pain and swelling of left knee 03/12/2019  ? Lateral epicondylitis, right elbow 03/15/2017  ? ? ?REFERRING DIAG: Rotator cuff tendinitis, right. bil patellofemoral pain ?  ? ?THERAPY DIAG:  ?Chronic pain of left knee ? ?Chronic pain of right knee ? ?Muscle weakness (generalized) ? ?Difficulty in walking, not elsewhere classified ? ?Acute bilateral low back pain without sciatica ? ?Pain in right hip ? ?Pain in left hip ? ?Chronic right shoulder pain ? ?PERTINENT HISTORY: DM with neuropathy, chronic LBP/hip pain ? ?PRECAUTIONS: None ? ?SUBJECTIVE: Pt states that Dr Raeford Razor is going to start Shock Wave Therapy treatment on his R shoulder tomorrow. ? ?PAIN:  ?Are you having pain? Yes ?NPRS scale: 3/10 ?Pain location: Right Shoulder ?PAIN TYPE: aching ?Pain description: intermittent  ? ?OBJECTIVE:  ?  ?DIAGNOSTIC FINDINGS:   ?Shoulder MRI on 03/15/21:  ?1. Moderate anterior greater than mid AP dimension of the ?infraspinatus tendinosis. Small partial-thickness midsubstance tears ?of the anterior infraspinatus tendon both proximally and distally. ?The distal anterior infraspinatus tendon tear minimally extends ?through the bursal tendon surface. ?2. Mild superior subscapularis tendinosis. ?3. 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 ?Type 3 acromion. ?4. Mild subacromial/subdeltoid bursitis. ?5. Mild-to-moderate glenohumeral cartilage degenerative changes with ?posteroinferior glenoid labrum likely degenerative tear. ?  ?PATIENT SURVEYS:  ?FOTO :  Shoulder 60%, Knee 65% ?  ?COGNITION: ?         Overall cognitive status: Within functional limits for tasks assessed ?  ?POSTURE: ?Forward head, rounded shoulder ?  ?UPPER EXTREMITY AROM/PROM: ?  ?A/PROM Right ?04/22/2021 Left ?04/22/2021 Right ?05/20/2021  ?Shoulder flexion 130 150 140  ?Shoulder extension       ?Shoulder abduction 120 135 142  ?Shoulder adduction       ?Shoulder internal rotation       ?Shoulder external rotation       ?Elbow flexion       ?Elbow extension       ?Wrist flexion       ?Wrist extension       ?Wrist ulnar deviation       ?Wrist radial deviation       ?Wrist pronation       ?Wrist supination       ?(Blank rows = not tested) ?  ?UPPER EXTREMITY  MMT: ?  ?04/22/2021 ?R shoulder strength of grossly 4 to 4+/5 throughout.  R hip strength of 4/5, R hamstring of 4/5, R quad of 4+/5. ?           ?TODAY'S TREATMENT:  ?  ?05/20/2021: ?Leg Press Bil 100# 2x10, single leg 50# x10 both seat 9 ?Seated row 40# 2x10 ?Lat Pull 45# 2x10 ?Seated hamstring stretch BIl 2x20 sec ?Seated piriformis stretch bilat 2x20 sec ?Seated reverse core hinges holding 8# wt 2x10 ?Spine twist in small hinge holding 8# wt 2x5 ?Sit to/from stand holding 15# kettlebell:  x10 with chest press, x10 with overhead press ?Right shoulder green band horizontal abduction, ER and IR 2x10 ?90  degree kitchen counter stretch 2 x 20 sec  ?Standing hip flexor stretch with one foot on step 2x20 sec bilat ? ?05/14/2021: ?Nustep L7 x3 min with PT present to discuss status ?Leg Press Bil 100# 2x10, single leg 50# x10 both seat 9 ?Seated row 40# 2x10 ?Lat Pull 45# 2x10 ?Sit to/from stand holding 15# kettlebell:  x10 with chest press, x10 with overhead press ? ?05/11/2021: ?Nustep L5 x 10 min ?Leg Press Bil 90# 2x10, single leg 50# 2x10 both seat 9 ?Lat Pull 45# 2x10 ?Seated hamstring stretch BIl 2x20 sec ?Seated row 35# 2x15 ?Seated reverse core hinges holding 8# wt 2x10 ?Spine twist in small hinge holding 8# wt 2x5 ?Bil shoulder green band E/IR 2x10 ?Sit to stand with 15# KB 10x ?90 degree kitchen counter stretch 20 sec  ? ? ?PATIENT EDUCATION: ?Education details: Pt educated on the importance of continued HEP. ?Person educated: Patient ?Education method: Explanation ?Education comprehension: verbalized understanding ?  ?  ?HOME EXERCISE PROGRAM: ?Access Code: [email or web address] ?URL: [email or web address] ?Date: 04/22/2021 ?Prepared by: Juel Burrow ?  ?Exercises ?Hip Flexor Stretch on Step - 1 x daily - 7 x weekly - 1 sets - 10 reps ?Standing Sports administrator with Table and Chair Support - 2 x daily - 7 x weekly - 1 sets - 3 reps - 30 sec hold ?Long Sitting Quad Set - 2 x daily - 7 x weekly - 1 sets - 20 reps - 2-3 sec hold ?Supine Knee Extension Strengthening - 2 x daily - 7 x weekly - 1 sets - 20 reps ?Small Range Straight Leg Raise - 2 x daily - 7 x weekly - 1 sets - 20 reps ?Supine Bridge - 1 x daily - 7 x weekly - 1 sets - 10 reps ?Hooklying Isometric Hip Flexion - 1 x daily - 7 x weekly - 1 sets - 10 reps - 5 hold ?Sidelying Hip Abduction - 1 x daily - 7 x weekly - 1 sets - 10 reps ?Sit to Stand Without Arm Support - 1 x daily - 7 x weekly - 1 sets - 10 reps ?Standing Shoulder Row with Anchored Resistance - 1-2 x daily - 7 x weekly - 2 sets - 10 reps ?Shoulder extension with resistance - Neutral - 1-2 x  daily - 7 x weekly - 2 sets - 10 reps ?Wall Push Up - 1-2 x daily - 7 x weekly - 2 sets - 10 reps ?Sit to Stand Without Arm Support - 1 x daily - 7 x weekly - 2 sets - 10 reps ?  ?  ?ASSESSMENT: ?  ?CLINICAL IMPRESSION: ?Mr Albus arrives stating that he already exercised on his Eliptical this morning. Pt has progressed with his shoulder A/ROM since last month.  He will start shock wave therapy treatments  tomorrow. Pt is progressing towards goal related activities and tolerating sessions well.  Pt continues to require min cuing for core stability throughout, but is improving with his posture.  Pt continues to require skilled PT to progress towards goal related activities. ?  ?OBJECTIVE IMPAIRMENTS decreased activity tolerance, decreased balance, difficulty walking, decreased ROM, decreased strength, increased muscle spasms, impaired flexibility, impaired UE functional use, improper body mechanics, postural dysfunction, and pain.  ?  ?ACTIVITY LIMITATIONS community activity and yard work.  ?  ?PERSONAL FACTORS 1-2 comorbidities: DM with neuropathy; chronic LBP/hip pain  are also affecting patient's functional outcome.  ?  ?  ?REHAB POTENTIAL: Good ?  ?CLINICAL DECISION MAKING: Evolving/moderate complexity ?  ?EVALUATION COMPLEXITY: Moderate ?  ?  ?GOALS: ?Goals reviewed with patient? Yes ?  ?SHORT TERM GOALS: ?  ?STG Name Target Date Goal status  ?1 Pt will be I and compliant with initial HEP.  ?Baseline:  05/06/2021 MET  ?2 Patient will have increased HS length to 65 degrees bil and hip extension to 10 degrees bil  ?Baseline:  05/06/2021 MET  ?3 The patient will rise sit to stand 5x in 16 sec or less  ?Baseline: 05/06/2021 MET  ?4 Pt will report ability to walk 1/2 mile with pain/discomfort 3/10 or less  ?Baseline: 05/06/2021 MET  ?  ?LONG TERM GOALS:  ?  ?LTG Name Target Date Goal status  ?1 The patient will be independent in a safe self progression of HEP  ?Baseline: 06/17/2021 IN PROGRESS  ?2 Pt will report ability to  walk dogs for at least 25 minutes at a time or 1 mile with pain 3/10 or less  ?Baseline: 06/17/2021 IN PROGRESS  ?3 Bil knee and shoulder strength improved to 4+/5 and hip strength to 4/5 needed for standing/wal

## 2021-05-21 ENCOUNTER — Ambulatory Visit (INDEPENDENT_AMBULATORY_CARE_PROVIDER_SITE_OTHER): Payer: Self-pay | Admitting: Family Medicine

## 2021-05-21 DIAGNOSIS — M7581 Other shoulder lesions, right shoulder: Secondary | ICD-10-CM

## 2021-05-21 NOTE — Progress Notes (Signed)
?  Noah Lane - 66 y.o. male MRN [id number]  Date of birth: 1955/04/28 ? ?SUBJECTIVE:  Including CC & ROS.  ?No chief complaint on file. ? ? ?Noah Lane is a 66 y.o. male that is here for shockwave therapy. ? ? ?Review of Systems ?See HPI  ? ?HISTORY: Past Medical, Surgical, Social, and Family History Reviewed & Updated per EMR.   ?Pertinent Historical Findings include: ? ?Past Medical History:  ?Diagnosis Date  ? Diabetes mellitus without complication ([place])   ? ? ?Past Surgical History:  ?Procedure Laterality Date  ? HERNIA REPAIR  2012  ? ? ? ?PHYSICAL EXAM:  ?VS: BP (!) 142/70   Ht '6\' 1"'$  (1.854 m)   Wt 235 lb (106.6 kg)   BMI 31.00 kg/m?  ?Physical Exam ?Gen: NAD, alert, cooperative with exam, well-appearing ?MSK:  ?Neurovascularly intact   ? ?ECSWT Note ?Knute Neu ?[birth year] ? ?Procedure: ECSWT ?Indications: right shoulder pain  ? ?Procedure Details ?Consent: Risks of procedure as well as the alternatives and risks of each were explained to the (patient/caregiver).  Consent for procedure obtained. ?Time Out: Verified patient identification, verified procedure, site/side was marked, verified correct patient position, special equipment/implants available, medications/allergies/relevent history reviewed, required imaging and test results available.  Performed.  The area was cleaned with iodine and alcohol swabs.   ? ?The right shoulder was targeted for Extracorporeal shockwave therapy.  ? ?Preset: shoulder problems ?Power Level: 40 ?Frequency: 10 ?Impulse/cycles: 2800 ?Head size: medium  ?Session: 1st ? ?Patient did tolerate procedure well. ? ? ? ?ASSESSMENT & PLAN:  ? ?Rotator cuff tendinitis, right ?Completed shockwave therapy ? ? ? ? ?

## 2021-05-21 NOTE — Assessment & Plan Note (Signed)
Completed shockwave therapy  

## 2021-05-26 ENCOUNTER — Telehealth: Payer: Self-pay | Admitting: Family Medicine

## 2021-05-26 NOTE — Telephone Encounter (Signed)
[facility] states medicare requires last ov notes to keep covering [employer]. This can be faxed to [phone]. Please advise.  ?

## 2021-05-26 NOTE — Telephone Encounter (Signed)
Faxed office visit notes as requested. ?

## 2021-05-27 ENCOUNTER — Ambulatory Visit (INDEPENDENT_AMBULATORY_CARE_PROVIDER_SITE_OTHER): Payer: Self-pay | Admitting: Family Medicine

## 2021-05-27 ENCOUNTER — Encounter: Payer: Self-pay | Admitting: Family Medicine

## 2021-05-27 ENCOUNTER — Encounter: Payer: Self-pay | Admitting: Rehabilitative and Restorative Service Providers"

## 2021-05-27 ENCOUNTER — Ambulatory Visit: Payer: Medicare Other | Admitting: Rehabilitative and Restorative Service Providers"

## 2021-05-27 ENCOUNTER — Other Ambulatory Visit: Payer: Self-pay

## 2021-05-27 DIAGNOSIS — M545 Low back pain, unspecified: Secondary | ICD-10-CM

## 2021-05-27 DIAGNOSIS — M25552 Pain in left hip: Secondary | ICD-10-CM

## 2021-05-27 DIAGNOSIS — M7581 Other shoulder lesions, right shoulder: Secondary | ICD-10-CM

## 2021-05-27 DIAGNOSIS — R262 Difficulty in walking, not elsewhere classified: Secondary | ICD-10-CM

## 2021-05-27 DIAGNOSIS — G8929 Other chronic pain: Secondary | ICD-10-CM

## 2021-05-27 DIAGNOSIS — M25551 Pain in right hip: Secondary | ICD-10-CM

## 2021-05-27 DIAGNOSIS — M25562 Pain in left knee: Secondary | ICD-10-CM | POA: Diagnosis not present

## 2021-05-27 DIAGNOSIS — M6281 Muscle weakness (generalized): Secondary | ICD-10-CM

## 2021-05-27 NOTE — Therapy (Signed)
?OUTPATIENT PHYSICAL THERAPY TREATMENT NOTE ? ? ?Patient Name: Noah Lane ?MRN: [id number] ?DOB:[birth year], 66 y.o., male ?Today's Date: 05/27/2021 ? ?PCP: [name], DO ?REFERRING PROVIDER: Dr Clearance Coots ? ? PT End of Session - 05/27/21 0805   ? ? Visit Number 24   ? Date for PT Re-Evaluation 06/19/21   ? Authorization Type Medicare 30th visit progress note   ? PT Start Time 0800   ? PT Stop Time 0840   ? PT Time Calculation (min) 40 min   ? Activity Tolerance Patient tolerated treatment well   ? Behavior During Therapy [facility] for tasks assessed/performed   ? ?  ?  ? ?  ? ? ?Past Medical History:  ?Diagnosis Date  ? Diabetes mellitus without complication ([place])   ? ?Past Surgical History:  ?Procedure Laterality Date  ? HERNIA REPAIR  2012  ? ?Patient Active Problem List  ? Diagnosis Date Noted  ? Patellofemoral pain syndrome of both knees 02/24/2021  ? Rotator cuff tendinitis, right 12/28/2019  ? Hip pain, bilateral 05/28/2019  ? Chronic bilateral low back pain without sciatica 05/28/2019  ? Diabetes mellitus type 2 in obese ([place]) 05/28/2019  ? Erectile dysfunction 05/28/2019  ? Pain and swelling of left knee 03/12/2019  ? Lateral epicondylitis, right elbow 03/15/2017  ? ? ?REFERRING DIAG: Rotator cuff tendinitis, right. bil patellofemoral pain ?  ? ?THERAPY DIAG:  ?Chronic pain of left knee ? ?Chronic pain of right knee ? ?Muscle weakness (generalized) ? ?Difficulty in walking, not elsewhere classified ? ?Acute bilateral low back pain without sciatica ? ?Pain in right hip ? ?Pain in left hip ? ?Chronic right shoulder pain ? ?PERTINENT HISTORY: DM with neuropathy, chronic LBP/hip pain ? ?PRECAUTIONS: None ? ?SUBJECTIVE: Pt reports no new complaints. ? ?PAIN:  ?Are you having pain? Yes ?NPRS scale: 5/10 ?Pain location: Right Shoulder ?PAIN TYPE: aching ?Pain description: intermittent  ? ?OBJECTIVE:  ?  ?DIAGNOSTIC FINDINGS:  ?Shoulder MRI on 03/15/21:  ?1. Moderate anterior greater than mid AP  dimension of the ?infraspinatus tendinosis. Small partial-thickness midsubstance tears ?of the anterior infraspinatus tendon both proximally and distally. ?The distal anterior infraspinatus tendon tear minimally extends ?through the bursal tendon surface. ?2. Mild superior subscapularis tendinosis. ?3. 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 ?Type 3 acromion. ?4. Mild subacromial/subdeltoid bursitis. ?5. Mild-to-moderate glenohumeral cartilage degenerative changes with ?posteroinferior glenoid labrum likely degenerative tear. ?  ?PATIENT SURVEYS:  ?FOTO :  Shoulder 60%, Knee 65% ?  ?COGNITION: ?         Overall cognitive status: Within functional limits for tasks assessed ?  ?POSTURE: ?Forward head, rounded shoulder ?  ?UPPER EXTREMITY AROM/PROM: ?  ?A/PROM Right ?04/22/2021 Left ?04/22/2021 Right ?05/20/2021  ?Shoulder flexion 130 150 140  ?Shoulder extension       ?Shoulder abduction 120 135 142  ?Shoulder adduction       ?Shoulder internal rotation       ?Shoulder external rotation       ?Elbow flexion       ?Elbow extension       ?Wrist flexion       ?Wrist extension       ?Wrist ulnar deviation       ?Wrist radial deviation       ?Wrist pronation       ?Wrist supination       ?(Blank rows = not tested) ?  ?UPPER EXTREMITY MMT: ?  ?04/22/2021 ?R shoulder strength of grossly 4 to 4+/5 throughout.  R hip strength of 4/5, R hamstring of 4/5, R quad of 4+/5. ?           ?TODAY'S TREATMENT:  ?  ?05/27/2021: ?Standing hamstring stretch BIl 2x20 sec ?Standing quad stretch bil 2x20 sec ?Seated piriformis stretch bilat 2x20 sec ?Lat Pull 50# 2x10 ?Seated row 40# 2x10 ?Leg Press Bil 110# 2x10, single leg 55# x10 both seat 9 ?Sit to/from stand holding 15# kettlebell:  x10 with chest press, x10 with overhead press ?Seated reverse core hinges holding 8# wt 2x10 ?Spine twist in small hinge holding 8# wt 2x5 ?Shoulder green band horizontal abduction, ER and R shoulder only IR 2x10 ? ?05/20/2021: ?Leg Press Bil  100# 2x10, single leg 50# x10 both seat 9 ?Seated row 40# 2x10 ?Lat Pull 45# 2x10 ?Seated hamstring stretch BIl 2x20 sec ?Seated piriformis stretch bilat 2x20 sec ?Seated reverse core hinges holding 8# wt 2x10 ?Spine twist in small hinge holding 8# wt 2x5 ?Sit to/from stand holding 15# kettlebell:  x10 with chest press, x10 with overhead press ?Right shoulder green band horizontal abduction, ER and IR 2x10 ?90 degree kitchen counter stretch 2 x 20 sec  ?Standing hip flexor stretch with one foot on step 2x20 sec bilat ? ?05/14/2021: ?Nustep L7 x3 min with PT present to discuss status ?Leg Press Bil 100# 2x10, single leg 50# x10 both seat 9 ?Seated row 40# 2x10 ?Lat Pull 45# 2x10 ?Sit to/from stand holding 15# kettlebell:  x10 with chest press, x10 with overhead press ? ? ? ?PATIENT EDUCATION: ?Education details: Pt educated on the importance of continued HEP. ?Person educated: Patient ?Education method: Explanation ?Education comprehension: verbalized understanding ?  ?  ?HOME EXERCISE PROGRAM: ?Access Code: [email or web address] ?URL: [email or web address] ?Date: 04/22/2021 ?Prepared by: Juel Burrow ?  ?Exercises ?Hip Flexor Stretch on Step - 1 x daily - 7 x weekly - 1 sets - 10 reps ?Standing Sports administrator with Table and Chair Support - 2 x daily - 7 x weekly - 1 sets - 3 reps - 30 sec hold ?Long Sitting Quad Set - 2 x daily - 7 x weekly - 1 sets - 20 reps - 2-3 sec hold ?Supine Knee Extension Strengthening - 2 x daily - 7 x weekly - 1 sets - 20 reps ?Small Range Straight Leg Raise - 2 x daily - 7 x weekly - 1 sets - 20 reps ?Supine Bridge - 1 x daily - 7 x weekly - 1 sets - 10 reps ?Hooklying Isometric Hip Flexion - 1 x daily - 7 x weekly - 1 sets - 10 reps - 5 hold ?Sidelying Hip Abduction - 1 x daily - 7 x weekly - 1 sets - 10 reps ?Sit to Stand Without Arm Support - 1 x daily - 7 x weekly - 1 sets - 10 reps ?Standing Shoulder Row with Anchored Resistance - 1-2 x daily - 7 x weekly - 2 sets - 10 reps ?Shoulder  extension with resistance - Neutral - 1-2 x daily - 7 x weekly - 2 sets - 10 reps ?Wall Push Up - 1-2 x daily - 7 x weekly - 2 sets - 10 reps ?Sit to Stand Without Arm Support - 1 x daily - 7 x weekly - 2 sets - 10 reps ?  ?  ?ASSESSMENT: ?  ?CLINICAL IMPRESSION: ?Mr Gatliff arrives stating that he already exercised on his Eliptical this morning. Pt reports that he can tell that his shoulder is getting stronger and activities do not bother him  as much.  Pt demonstrates improved A/ROM throughout session. Pt continues to progress towards goal related activities and would benefit from continued skilled PT to progress towards decreased pain with functional tasks. ?  ?OBJECTIVE IMPAIRMENTS decreased activity tolerance, decreased balance, difficulty walking, decreased ROM, decreased strength, increased muscle spasms, impaired flexibility, impaired UE functional use, improper body mechanics, postural dysfunction, and pain.  ?  ?ACTIVITY LIMITATIONS community activity and yard work.  ?  ?PERSONAL FACTORS 1-2 comorbidities: DM with neuropathy; chronic LBP/hip pain  are also affecting patient's functional outcome.  ?  ?  ?REHAB POTENTIAL: Good ?  ?CLINICAL DECISION MAKING: Evolving/moderate complexity ?  ?EVALUATION COMPLEXITY: Moderate ?  ?  ?GOALS: ?Goals reviewed with patient? Yes ?  ?SHORT TERM GOALS: ?  ?STG Name Target Date Goal status  ?1 Pt will be I and compliant with initial HEP.  ?Baseline:  05/06/2021 MET  ?2 Patient will have increased HS length to 65 degrees bil and hip extension to 10 degrees bil  ?Baseline:  05/06/2021 MET  ?3 The patient will rise sit to stand 5x in 16 sec or less  ?Baseline: 05/06/2021 MET  ?4 Pt will report ability to walk 1/2 mile with pain/discomfort 3/10 or less  ?Baseline: 05/06/2021 MET  ?  ?LONG TERM GOALS:  ?  ?LTG Name Target Date Goal status  ?1 The patient will be independent in a safe self progression of HEP  ?Baseline: 06/17/2021 IN PROGRESS  ?2 Pt will report ability to walk dogs for  at least 25 minutes at a time or 1 mile with pain 3/10 or less  ?Baseline: 06/17/2021 IN PROGRESS  ?3 Bil knee and shoulder strength improved to 4+/5 and hip strength to 4/5 needed for standing/walking longer

## 2021-05-27 NOTE — Progress Notes (Signed)
?  Noah Lane - 66 y.o. male MRN [id number]  Date of birth: Feb 25, 1956 ? ?SUBJECTIVE:  Including CC & ROS.  ?No chief complaint on file. ? ? ?Noah Lane is a 67 y.o. male that is here for shockwave therapy. ? ? ? ?Review of Systems ?See HPI  ? ?HISTORY: Past Medical, Surgical, Social, and Family History Reviewed & Updated per EMR.   ?Pertinent Historical Findings include: ? ?Past Medical History:  ?Diagnosis Date  ? Diabetes mellitus without complication ([place])   ? ? ?Past Surgical History:  ?Procedure Laterality Date  ? HERNIA REPAIR  2012  ? ? ? ?PHYSICAL EXAM:  ?VS: Ht '6\' 1"'$  (1.854 m)   Wt 235 lb (106.6 kg)   BMI 31.00 kg/m?  ?Physical Exam ?Gen: NAD, alert, cooperative with exam, well-appearing ?MSK:  ?Neurovascularly intact   ? ?ECSWT Note ?Knute Neu ?08/08/1955 ? ?Procedure: ECSWT ?Indications: right shoulder pain  ? ?Procedure Details ?Consent: Risks of procedure as well as the alternatives and risks of each were explained to the (patient/caregiver).  Consent for procedure obtained. ?Time Out: Verified patient identification, verified procedure, site/side was marked, verified correct patient position, special equipment/implants available, medications/allergies/relevent history reviewed, required imaging and test results available.  Performed.  The area was cleaned with iodine and alcohol swabs.   ? ?The right shoulder was targeted for Extracorporeal shockwave therapy.  ? ?Preset: shoulder problems ?Power Level: 60 ?Frequency: 10 ?Impulse/cycles: 2800 ?Head size: medium  ?Session: 2nd ? ?Patient did tolerate procedure well. ? ? ? ?ASSESSMENT & PLAN:  ? ?Rotator cuff tendinitis, right ?Completed shockwave therapy  ? ? ? ? ?

## 2021-05-27 NOTE — Assessment & Plan Note (Signed)
Completed shockwave therapy  

## 2021-06-03 ENCOUNTER — Encounter: Payer: Self-pay | Admitting: Rehabilitative and Restorative Service Providers"

## 2021-06-03 ENCOUNTER — Encounter: Payer: Self-pay | Admitting: Family Medicine

## 2021-06-03 ENCOUNTER — Ambulatory Visit (INDEPENDENT_AMBULATORY_CARE_PROVIDER_SITE_OTHER): Payer: Medicare Other | Admitting: Family Medicine

## 2021-06-03 ENCOUNTER — Ambulatory Visit: Payer: Medicare Other | Attending: Family Medicine | Admitting: Rehabilitative and Restorative Service Providers"

## 2021-06-03 DIAGNOSIS — M25561 Pain in right knee: Secondary | ICD-10-CM | POA: Insufficient documentation

## 2021-06-03 DIAGNOSIS — M25562 Pain in left knee: Secondary | ICD-10-CM | POA: Insufficient documentation

## 2021-06-03 DIAGNOSIS — G8929 Other chronic pain: Secondary | ICD-10-CM | POA: Insufficient documentation

## 2021-06-03 DIAGNOSIS — M25552 Pain in left hip: Secondary | ICD-10-CM | POA: Insufficient documentation

## 2021-06-03 DIAGNOSIS — R262 Difficulty in walking, not elsewhere classified: Secondary | ICD-10-CM | POA: Diagnosis present

## 2021-06-03 DIAGNOSIS — M7581 Other shoulder lesions, right shoulder: Secondary | ICD-10-CM

## 2021-06-03 DIAGNOSIS — M25511 Pain in right shoulder: Secondary | ICD-10-CM | POA: Insufficient documentation

## 2021-06-03 DIAGNOSIS — M25551 Pain in right hip: Secondary | ICD-10-CM | POA: Diagnosis present

## 2021-06-03 DIAGNOSIS — M6281 Muscle weakness (generalized): Secondary | ICD-10-CM | POA: Insufficient documentation

## 2021-06-03 DIAGNOSIS — M545 Low back pain, unspecified: Secondary | ICD-10-CM | POA: Diagnosis present

## 2021-06-03 NOTE — Therapy (Signed)
?OUTPATIENT PHYSICAL THERAPY TREATMENT NOTE ? ? ?Patient Name: Noah Lane ?MRN: [id number] ?DOB:1956-02-22, 66 y.o., male ?Today's Date: 06/03/2021 ? ?PCP: [name], DO ?REFERRING PROVIDER: Dr Clearance Coots ? ? PT End of Session - 06/03/21 0800   ? ? Visit Number 25   ? Date for PT Re-Evaluation 06/19/21   ? Authorization Type Medicare 30th visit progress note   ? PT Start Time 0800   ? PT Stop Time 0840   ? PT Time Calculation (min) 40 min   ? Activity Tolerance Patient tolerated treatment well   ? Behavior During Therapy [facility] for tasks assessed/performed   ? ?  ?  ? ?  ? ? ?Past Medical History:  ?Diagnosis Date  ? Diabetes mellitus without complication ([place])   ? ?Past Surgical History:  ?Procedure Laterality Date  ? HERNIA REPAIR  2012  ? ?Patient Active Problem List  ? Diagnosis Date Noted  ? Patellofemoral pain syndrome of both knees 02/24/2021  ? Rotator cuff tendinitis, right 12/28/2019  ? Hip pain, bilateral 05/28/2019  ? Chronic bilateral low back pain without sciatica 05/28/2019  ? Diabetes mellitus type 2 in obese ([place]) 05/28/2019  ? Erectile dysfunction 05/28/2019  ? Pain and swelling of left knee 03/12/2019  ? Lateral epicondylitis, right elbow 03/15/2017  ? ? ?REFERRING DIAG: Rotator cuff tendinitis, right. bil patellofemoral pain ?  ? ?THERAPY DIAG:  ?Chronic pain of left knee ? ?Chronic pain of right knee ? ?Muscle weakness (generalized) ? ?Difficulty in walking, not elsewhere classified ? ?Acute bilateral low back pain without sciatica ? ?Pain in right hip ? ?Pain in left hip ? ?Chronic right shoulder pain ? ?PERTINENT HISTORY: DM with neuropathy, chronic LBP/hip pain ? ?PRECAUTIONS: None ? ?SUBJECTIVE: Pt reports reports that he can notice that he is able to get up from the sofa without my knees hurting.  Pt reports that he is going to his 3rd shock wave treatment following visit. ? ?PAIN:  ?Are you having pain? Yes ?NPRS scale: 5/10 ?Pain location: Right Shoulder ?PAIN TYPE:  aching ?Pain description: intermittent  ? ?OBJECTIVE:  ?  ?DIAGNOSTIC FINDINGS:  ?Shoulder MRI on 03/15/21:  ?1. Moderate anterior greater than mid AP dimension of the ?infraspinatus tendinosis. Small partial-thickness midsubstance tears ?of the anterior infraspinatus tendon both proximally and distally. ?The distal anterior infraspinatus tendon tear minimally extends ?through the bursal tendon surface. ?2. Mild superior subscapularis tendinosis. ?3. 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 ?Type 3 acromion. ?4. Mild subacromial/subdeltoid bursitis. ?5. Mild-to-moderate glenohumeral cartilage degenerative changes with ?posteroinferior glenoid labrum likely degenerative tear. ?  ?PATIENT SURVEYS:  ?FOTO :  Shoulder 60%, Knee 65% ?  ?COGNITION: ?         Overall cognitive status: Within functional limits for tasks assessed ?  ?POSTURE: ?Forward head, rounded shoulder ?  ?UPPER EXTREMITY AROM/PROM: ?  ?A/PROM Right ?04/22/2021 Left ?04/22/2021 Right ?05/20/2021  ?Shoulder flexion 130 150 140  ?Shoulder extension       ?Shoulder abduction 120 135 142  ?Shoulder adduction       ?Shoulder internal rotation       ?Shoulder external rotation       ?Elbow flexion       ?Elbow extension       ?Wrist flexion       ?Wrist extension       ?Wrist ulnar deviation       ?Wrist radial deviation       ?Wrist pronation       ?  Wrist supination       ?(Blank rows = not tested) ?  ?UPPER EXTREMITY MMT: ?  ?04/22/2021 ?R shoulder strength of grossly 4 to 4+/5 throughout.  R hip strength of 4/5, R hamstring of 4/5, R quad of 4+/5. ?           ?TODAY'S TREATMENT:  ?  ?06/03/2021: ?Nustep L5 x6 min with PT present to discuss status ?Standing hamstring stretch BIl 2x20 sec ?Standing quad stretch bil 2x20 sec ?Lat Pull 50# 2x10 ?Tricep [place] 30# 2x10 ?Seated row 40# 2x10 ?90 degree kitchen counter stretch 2 x 20 sec  ?Barre push ups 2x10 ?Leg Press Bil 110# 2x10, single leg 55# x10 both seat 9 ?Sit to/from stand holding 15#  kettlebell:  x10 with chest press, x10 with overhead press ?Shoulder green band horizontal abduction, bilat ER 2x10 ? ?05/27/2021: ?Standing hamstring stretch BIl 2x20 sec ?Standing quad stretch bil 2x20 sec ?Seated piriformis stretch bilat 2x20 sec ?Lat Pull 50# 2x10 ?Seated row 40# 2x10 ?Leg Press Bil 110# 2x10, single leg 55# x10 both seat 9 ?Sit to/from stand holding 15# kettlebell:  x10 with chest press, x10 with overhead press ?Seated reverse core hinges holding 8# wt 2x10 ?Spine twist in small hinge holding 8# wt 2x5 ?Shoulder green band horizontal abduction, ER and R shoulder only IR 2x10 ? ?05/20/2021: ?Leg Press Bil 100# 2x10, single leg 50# x10 both seat 9 ?Seated row 40# 2x10 ?Lat Pull 45# 2x10 ?Seated hamstring stretch BIl 2x20 sec ?Seated piriformis stretch bilat 2x20 sec ?Seated reverse core hinges holding 8# wt 2x10 ?Spine twist in small hinge holding 8# wt 2x5 ?Sit to/from stand holding 15# kettlebell:  x10 with chest press, x10 with overhead press ?Right shoulder green band horizontal abduction, ER and IR 2x10 ?90 degree kitchen counter stretch 2 x 20 sec  ?Standing hip flexor stretch with one foot on step 2x20 sec bilat ? ? ?PATIENT EDUCATION: ?Education details: Pt educated on the importance of continued HEP. ?Person educated: Patient ?Education method: Explanation ?Education comprehension: verbalized understanding ?  ?  ?HOME EXERCISE PROGRAM: ?Access Code: [email or web address] ?URL: [email or web address] ?Date: 04/22/2021 ?Prepared by: Juel Burrow ?  ?Exercises ?Hip Flexor Stretch on Step - 1 x daily - 7 x weekly - 1 sets - 10 reps ?Standing Sports administrator with Table and Chair Support - 2 x daily - 7 x weekly - 1 sets - 3 reps - 30 sec hold ?Long Sitting Quad Set - 2 x daily - 7 x weekly - 1 sets - 20 reps - 2-3 sec hold ?Supine Knee Extension Strengthening - 2 x daily - 7 x weekly - 1 sets - 20 reps ?Small Range Straight Leg Raise - 2 x daily - 7 x weekly - 1 sets - 20 reps ?Supine Bridge - 1  x daily - 7 x weekly - 1 sets - 10 reps ?Hooklying Isometric Hip Flexion - 1 x daily - 7 x weekly - 1 sets - 10 reps - 5 hold ?Sidelying Hip Abduction - 1 x daily - 7 x weekly - 1 sets - 10 reps ?Sit to Stand Without Arm Support - 1 x daily - 7 x weekly - 1 sets - 10 reps ?Standing Shoulder Row with Anchored Resistance - 1-2 x daily - 7 x weekly - 2 sets - 10 reps ?Shoulder extension with resistance - Neutral - 1-2 x daily - 7 x weekly - 2 sets - 10 reps ?Wall Push Up - 1-2 x daily - 7  x weekly - 2 sets - 10 reps ?Sit to Stand Without Arm Support - 1 x daily - 7 x weekly - 2 sets - 10 reps ?  ?  ?ASSESSMENT: ?  ?CLINICAL IMPRESSION: ?Mr Youkhana arrives eager to participate in skilled PT.  Pt has made progress with goals and is now able to perform transfers from his sofa without experiencing knee pain.  Pt continues to have shoulder weakness and pain, and has been trying shockwave therapy, but is uncertain how much it is helping at this time.  Min cuing required throughout for improved body mechanics and pt is improving with overall flexibility. Overall focus on maintaining gains from knee pain/strength and improving shoulder strength and functional use with less pain.  Pt continues to require skilled PT to address his functional impairments to allow him to return to playing golf. ?  ?OBJECTIVE IMPAIRMENTS decreased activity tolerance, decreased balance, difficulty walking, decreased ROM, decreased strength, increased muscle spasms, impaired flexibility, impaired UE functional use, improper body mechanics, postural dysfunction, and pain.  ?  ?ACTIVITY LIMITATIONS community activity and yard work.  ?  ?PERSONAL FACTORS 1-2 comorbidities: DM with neuropathy; chronic LBP/hip pain  are also affecting patient's functional outcome.  ?  ?  ?REHAB POTENTIAL: Good ?  ?CLINICAL DECISION MAKING: Evolving/moderate complexity ?  ?EVALUATION COMPLEXITY: Moderate ?  ?  ?GOALS: ?Goals reviewed with patient? Yes ?  ?SHORT TERM  GOALS: ?  ?STG Name Target Date Goal status  ?1 Pt will be I and compliant with initial HEP.  ?Baseline:  05/06/2021 MET  ?2 Patient will have increased HS length to 65 degrees bil and hip extension to 10 degrees b

## 2021-06-03 NOTE — Assessment & Plan Note (Signed)
Completed shockwave therapy  

## 2021-06-03 NOTE — Progress Notes (Signed)
?  Noah Lane - 66 y.o. male MRN [id number]  Date of birth: [birth year] ? ?SUBJECTIVE:  Including CC & ROS.  ?No chief complaint on file. ? ? ?Noah Lane is a 66 y.o. male that is  here for shockwave therapy. ? ? ? ?Review of Systems ?See HPI  ? ?HISTORY: Past Medical, Surgical, Social, and Family History Reviewed & Updated per EMR.   ?Pertinent Historical Findings include: ? ?Past Medical History:  ?Diagnosis Date  ? Diabetes mellitus without complication ([place])   ? ? ?Past Surgical History:  ?Procedure Laterality Date  ? HERNIA REPAIR  2012  ? ? ? ?PHYSICAL EXAM:  ?VS: Ht '6\' 1"'$  (1.854 m)   Wt 235 lb (106.6 kg)   BMI 31.00 kg/m?  ?Physical Exam ?Gen: NAD, alert, cooperative with exam, well-appearing ?MSK:  ?Neurovascularly intact   ? ?ECSWT Note ?Knute Neu ?10/15/1955 ? ?Procedure: ECSWT ?Indications: right shoulder pain  ? ?Procedure Details ?Consent: Risks of procedure as well as the alternatives and risks of each were explained to the (patient/caregiver).  Consent for procedure obtained. ?Time Out: Verified patient identification, verified procedure, site/side was marked, verified correct patient position, special equipment/implants available, medications/allergies/relevent history reviewed, required imaging and test results available.  Performed.  The area was cleaned with iodine and alcohol swabs.   ? ?The right shoulder was targeted for Extracorporeal shockwave therapy.  ? ?Preset: shoulder problems  ?Power Level: 80 ?Frequency: 10 ?Impulse/cycles: 2800 ?Head size: medium  ?Session: 3rd ? ?Patient did tolerate procedure well. ? ? ? ?ASSESSMENT & PLAN:  ? ?Rotator cuff tendinitis, right ?Completed shockwave therapy ? ? ? ? ?

## 2021-06-10 ENCOUNTER — Encounter: Payer: Self-pay | Admitting: Family Medicine

## 2021-06-10 ENCOUNTER — Ambulatory Visit: Payer: Medicare Other

## 2021-06-10 DIAGNOSIS — M545 Low back pain, unspecified: Secondary | ICD-10-CM

## 2021-06-10 DIAGNOSIS — M25551 Pain in right hip: Secondary | ICD-10-CM

## 2021-06-10 DIAGNOSIS — M6281 Muscle weakness (generalized): Secondary | ICD-10-CM

## 2021-06-10 DIAGNOSIS — G8929 Other chronic pain: Secondary | ICD-10-CM

## 2021-06-10 DIAGNOSIS — M25552 Pain in left hip: Secondary | ICD-10-CM

## 2021-06-10 DIAGNOSIS — R262 Difficulty in walking, not elsewhere classified: Secondary | ICD-10-CM

## 2021-06-10 DIAGNOSIS — M25562 Pain in left knee: Secondary | ICD-10-CM | POA: Diagnosis not present

## 2021-06-10 NOTE — Therapy (Signed)
?OUTPATIENT PHYSICAL THERAPY TREATMENT NOTE ? ? ?Patient Name: Noah Lane ?MRN: [id number] ?DOB:1955/06/26, 66 y.o., male ?Today's Date: 06/10/2021 ? ?PCP: [name], DO ?REFERRING PROVIDER: Dr Clearance Coots ? ? PT End of Session - 06/10/21 6063   ? ? Visit Number 26   ? Date for PT Re-Evaluation 06/19/21   ? Authorization Type Medicare 30th visit progress note   ? Progress Note Due on Visit 30   ? PT Start Time 0845   ? PT Stop Time 0929   ? PT Time Calculation (min) 44 min   ? Activity Tolerance Patient tolerated treatment well   ? Behavior During Therapy [facility] for tasks assessed/performed   ? ?  ?  ? ?  ? ? ? ?Past Medical History:  ?Diagnosis Date  ? Diabetes mellitus without complication ([place])   ? ?Past Surgical History:  ?Procedure Laterality Date  ? HERNIA REPAIR  2012  ? ?Patient Active Problem List  ? Diagnosis Date Noted  ? Patellofemoral pain syndrome of both knees 02/24/2021  ? Rotator cuff tendinitis, right 12/28/2019  ? Hip pain, bilateral 05/28/2019  ? Chronic bilateral low back pain without sciatica 05/28/2019  ? Diabetes mellitus type 2 in obese ([place]) 05/28/2019  ? Erectile dysfunction 05/28/2019  ? Pain and swelling of left knee 03/12/2019  ? Lateral epicondylitis, right elbow 03/15/2017  ? ? ?REFERRING DIAG: Rotator cuff tendinitis, right. bil patellofemoral pain ?  ? ?THERAPY DIAG:  ?Chronic pain of left knee ? ?Chronic pain of right knee ? ?Muscle weakness (generalized) ? ?Difficulty in walking, not elsewhere classified ? ?Acute bilateral low back pain without sciatica ? ?Pain in right hip ? ?Pain in left hip ? ?PERTINENT HISTORY: DM with neuropathy, chronic LBP/hip pain ? ?PRECAUTIONS: None ? ?SUBJECTIVE: I am warmed up and did my stretches already this morning.  ? ?PAIN:  ?Are you having pain? Yes ?NPRS scale: 7/10 ?Pain location: Right Shoulder ?PAIN TYPE: aching, throbbing ?Pain description: intermittent  ? ?OBJECTIVE:  ?  ?DIAGNOSTIC FINDINGS:  ?Shoulder MRI on 03/15/21:  ?1.  Moderate anterior greater than mid AP dimension of the ?infraspinatus tendinosis. Small partial-thickness midsubstance tears ?of the anterior infraspinatus tendon both proximally and distally. ?The distal anterior infraspinatus tendon tear minimally extends ?through the bursal tendon surface. ?2. Mild superior subscapularis tendinosis. ?3. 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 ?Type 3 acromion. ?4. Mild subacromial/subdeltoid bursitis. ?5. Mild-to-moderate glenohumeral cartilage degenerative changes with ?posteroinferior glenoid labrum likely degenerative tear. ?  ?PATIENT SURVEYS:  ?FOTO :  Shoulder 60%, Knee 65% ?FOTO: 06/10/21: shoulder 57, knee 61 ?  ?COGNITION: ?         Overall cognitive status: Within functional limits for tasks assessed ?  ?POSTURE: ?Forward head, rounded shoulder ?  ?UPPER EXTREMITY AROM/PROM: ?  ?A/PROM Right ?04/22/2021 Left ?04/22/2021 Right ?05/20/2021  ?Shoulder flexion 130 150 140  ?Shoulder extension       ?Shoulder abduction 120 135 142  ?Shoulder adduction       ?Shoulder internal rotation       ?Shoulder external rotation       ?Elbow flexion       ?Elbow extension       ?Wrist flexion       ?Wrist extension       ?Wrist ulnar deviation       ?Wrist radial deviation       ?Wrist pronation       ?Wrist supination       ?(Blank rows =  not tested) ?  ?UPPER EXTREMITY MMT: ?  ?04/22/2021 ?R shoulder strength of grossly 4 to 4+/5 throughout.  R hip strength of 4/5, R hamstring of 4/5, R quad of 4+/5. ? ?           ?TODAY'S TREATMENT:  ?06/10/2021: ?Lat Pull 50# 2x10 ?Tricep Pulldown 30# 2x10 ?Seated row 40# 2x10 ?90 degree kitchen counter stretch 2 x 20 sec  ?Barre push ups 2x10 ?Leg Press Bil 110# 2x10, single leg 55# x10 both seat 9 ?Sit to/from stand holding 10# kettlebell:  x10 with chest press, x10 with overhead press ?Shoulder green band horizontal abduction, bilat ER 2x10 ?Spine twist in small hinge holding 10# wt 2x5 ?  ?06/03/2021: ?Nustep L5 x6 min with PT  present to discuss status ?Standing hamstring stretch BIl 2x20 sec ?Standing quad stretch bil 2x20 sec ?Lat Pull 50# 2x10 ?Tricep Pulldown 30# 2x10 ?Seated row 40# 2x10 ?90 degree kitchen counter stretch 2 x 20 sec  ?Barre push ups 2x10 ?Leg Press Bil 110# 2x10, single leg 55# x10 both seat 9 ?Sit to/from stand holding 15# kettlebell:  x10 with chest press, x10 with overhead press ?Shoulder green band horizontal abduction, bilat ER 2x10 ? ?05/27/2021: ?Standing hamstring stretch BIl 2x20 sec ?Standing quad stretch bil 2x20 sec ?Seated piriformis stretch bilat 2x20 sec ?Lat Pull 50# 2x10 ?Seated row 40# 2x10 ?Leg Press Bil 110# 2x10, single leg 55# x10 both seat 9 ?Sit to/from stand holding 15# kettlebell:  x10 with chest press, x10 with overhead press ?Seated reverse core hinges holding 8# wt 2x10 ?Spine twist in small hinge holding 8# wt 2x5 ?Shoulder green band horizontal abduction, ER and R shoulder only IR 2x10 ? ? ?PATIENT EDUCATION: ?Education details: Pt educated on the importance of continued HEP. ?Person educated: Patient ?Education method: Explanation ?Education comprehension: verbalized understanding ?  ?  ?HOME EXERCISE PROGRAM: ?Access Code: [email or web address] ?URL: [email or web address] ?Date: 04/22/2021 ?Prepared by: Juel Burrow ?  ?Exercises ?Hip Flexor Stretch on Step - 1 x daily - 7 x weekly - 1 sets - 10 reps ?Standing Sports administrator with Table and Chair Support - 2 x daily - 7 x weekly - 1 sets - 3 reps - 30 sec hold ?Long Sitting Quad Set - 2 x daily - 7 x weekly - 1 sets - 20 reps - 2-3 sec hold ?Supine Knee Extension Strengthening - 2 x daily - 7 x weekly - 1 sets - 20 reps ?Small Range Straight Leg Raise - 2 x daily - 7 x weekly - 1 sets - 20 reps ?Supine Bridge - 1 x daily - 7 x weekly - 1 sets - 10 reps ?Hooklying Isometric Hip Flexion - 1 x daily - 7 x weekly - 1 sets - 10 reps - 5 hold ?Sidelying Hip Abduction - 1 x daily - 7 x weekly - 1 sets - 10 reps ?Sit to Stand Without Arm  Support - 1 x daily - 7 x weekly - 1 sets - 10 reps ?Standing Shoulder Row with Anchored Resistance - 1-2 x daily - 7 x weekly - 2 sets - 10 reps ?Shoulder extension with resistance - Neutral - 1-2 x daily - 7 x weekly - 2 sets - 10 reps ?Wall Push Up - 1-2 x daily - 7 x weekly - 2 sets - 10 reps ?Sit to Stand Without Arm Support - 1 x daily - 7 x weekly - 2 sets - 10 reps ?  ?  ?ASSESSMENT: ?  ?CLINICAL IMPRESSION: ?  Pt arrived and had already warmed up and stretched.  He wanted to focus on weights today.  Pt has made progress with goals and is now able to perform transfers from his sofa without experiencing knee pain.  Pt continues to have shoulder weakness and pain, and is doing many things to address this, including shockwave therapy.  No change in FOTO status for shoulder and knee. Min cuing required throughout for improved body mechanics and pt is improving with overall flexibility. Overall focus of treatments is strength and endurance to allow for higher functional tasks.   ?  ?OBJECTIVE IMPAIRMENTS decreased activity tolerance, decreased balance, difficulty walking, decreased ROM, decreased strength, increased muscle spasms, impaired flexibility, impaired UE functional use, improper body mechanics, postural dysfunction, and pain.  ?  ?ACTIVITY LIMITATIONS community activity and yard work.  ?  ?PERSONAL FACTORS 1-2 comorbidities: DM with neuropathy; chronic LBP/hip pain  are also affecting patient's functional outcome.  ?  ?  ?REHAB POTENTIAL: Good ?  ?CLINICAL DECISION MAKING: Evolving/moderate complexity ?  ?EVALUATION COMPLEXITY: Moderate ?  ?  ?GOALS: ?Goals reviewed with patient? Yes ?  ?SHORT TERM GOALS: ?  ?STG Name Target Date Goal status  ?1 Pt will be I and compliant with initial HEP.  ?Baseline:  05/06/2021 MET  ?2 Patient will have increased HS length to 65 degrees bil and hip extension to 10 degrees bil  ?Baseline:  05/06/2021 MET  ?3 The patient will rise sit to stand 5x in 16 sec or less  ?Baseline:  05/06/2021 MET  ?4 Pt will report ability to walk 1/2 mile with pain/discomfort 3/10 or less  ?Baseline: 05/06/2021 MET  ?  ?LONG TERM GOALS:  ?  ?LTG Name Target Date Goal status  ?1 The patient will be indepen

## 2021-06-17 ENCOUNTER — Encounter: Payer: Self-pay | Admitting: Family Medicine

## 2021-06-17 ENCOUNTER — Ambulatory Visit: Payer: Medicare Other | Admitting: Rehabilitative and Restorative Service Providers"

## 2021-06-17 ENCOUNTER — Ambulatory Visit (INDEPENDENT_AMBULATORY_CARE_PROVIDER_SITE_OTHER): Payer: Medicare Other | Admitting: Family Medicine

## 2021-06-17 ENCOUNTER — Encounter: Payer: Self-pay | Admitting: Rehabilitative and Restorative Service Providers"

## 2021-06-17 DIAGNOSIS — M6281 Muscle weakness (generalized): Secondary | ICD-10-CM

## 2021-06-17 DIAGNOSIS — M25562 Pain in left knee: Secondary | ICD-10-CM | POA: Diagnosis not present

## 2021-06-17 DIAGNOSIS — M7581 Other shoulder lesions, right shoulder: Secondary | ICD-10-CM

## 2021-06-17 DIAGNOSIS — M25551 Pain in right hip: Secondary | ICD-10-CM

## 2021-06-17 DIAGNOSIS — M545 Low back pain, unspecified: Secondary | ICD-10-CM

## 2021-06-17 DIAGNOSIS — R262 Difficulty in walking, not elsewhere classified: Secondary | ICD-10-CM

## 2021-06-17 DIAGNOSIS — G8929 Other chronic pain: Secondary | ICD-10-CM

## 2021-06-17 DIAGNOSIS — M25552 Pain in left hip: Secondary | ICD-10-CM

## 2021-06-17 MED ORDER — NITROGLYCERIN 0.2 MG/HR TD PT24: MEDICATED_PATCH | TRANSDERMAL | 11 refills | Status: DC

## 2021-06-17 NOTE — Therapy (Signed)
?OUTPATIENT PHYSICAL THERAPY TREATMENT NOTE ? ? ?Patient Name: Noah Lane ?MRN: [id number] ?DOB:1956-02-16, 66 y.o., male ?Today's Date: 06/17/2021 ? ?PCP: [name], DO ?REFERRING PROVIDER: Dr Clearance Coots ? ? PT End of Session - 06/17/21 0739   ? ? Visit Number 27   ? Date for PT Re-Evaluation 08/14/21   ? Authorization Type Medicare 30th visit progress note   ? Progress Note Due on Visit 30   ? PT Start Time 0730   ? PT Stop Time 0810   ? PT Time Calculation (min) 40 min   ? Activity Tolerance Patient tolerated treatment well   ? Behavior During Therapy [facility] for tasks assessed/performed   ? ?  ?  ? ?  ? ? ? ?Past Medical History:  ?Diagnosis Date  ? Diabetes mellitus without complication ([place])   ? ?Past Surgical History:  ?Procedure Laterality Date  ? HERNIA REPAIR  2012  ? ?Patient Active Problem List  ? Diagnosis Date Noted  ? Patellofemoral pain syndrome of both knees 02/24/2021  ? Rotator cuff tendinitis, right 12/28/2019  ? Hip pain, bilateral 05/28/2019  ? Chronic bilateral low back pain without sciatica 05/28/2019  ? Diabetes mellitus type 2 in obese ([place]) 05/28/2019  ? Erectile dysfunction 05/28/2019  ? Pain and swelling of left knee 03/12/2019  ? Lateral epicondylitis, right elbow 03/15/2017  ? ? ?REFERRING DIAG: Rotator cuff tendinitis, right. bil patellofemoral pain ?  ? ?THERAPY DIAG:  ?Chronic pain of left knee ? ?Chronic pain of right knee ? ?Muscle weakness (generalized) ? ?Difficulty in walking, not elsewhere classified ? ?Acute bilateral low back pain without sciatica ? ?Pain in right hip ? ?Pain in left hip ? ?Chronic right shoulder pain ? ?PERTINENT HISTORY: DM with neuropathy, chronic LBP/hip pain ? ?PRECAUTIONS: None ? ?SUBJECTIVE: I did not do much this weekend, I tripped in my garden and fell on Saturday.  ? ?PAIN:  ?Are you having pain? Yes ?NPRS scale: 6/10 ?Pain location: Right Shoulder ?PAIN TYPE: aching, throbbing ?Pain description: intermittent  ? ?OBJECTIVE:  ?   ?DIAGNOSTIC FINDINGS:  ?Shoulder MRI on 03/15/21:  ?1. Moderate anterior greater than mid AP dimension of the ?infraspinatus tendinosis. Small partial-thickness midsubstance tears ?of the anterior infraspinatus tendon both proximally and distally. ?The distal anterior infraspinatus tendon tear minimally extends ?through the bursal tendon surface. ?2. Mild superior subscapularis tendinosis. ?3. 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 ?Type 3 acromion. ?4. Mild subacromial/subdeltoid bursitis. ?5. Mild-to-moderate glenohumeral cartilage degenerative changes with ?posteroinferior glenoid labrum likely degenerative tear. ?  ?PATIENT SURVEYS:  ?FOTO :  Shoulder 60%, Knee 65% ?FOTO: 06/10/21: shoulder 57%, knee 61% ?  ?COGNITION: ?         Overall cognitive status: Within functional limits for tasks assessed ?  ?POSTURE: ?Forward head, rounded shoulder ?  ?UPPER EXTREMITY AROM/PROM: ?  ?A/PROM Right ?04/22/2021 Left ?04/22/2021 Right ?05/20/2021 Right ?06/17/2021  ?Shoulder flexion 130 150 140 145  ?Shoulder extension        ?Shoulder abduction 120 135 142 146  ?Shoulder adduction        ?Shoulder internal rotation      WFL  ?Shoulder external rotation      WFL  ?Elbow flexion        ?Elbow extension        ?Wrist flexion        ?Wrist extension        ?Wrist ulnar deviation        ?Wrist radial deviation        ?  Wrist pronation        ?Wrist supination        ?(Blank rows = not tested) ?  ?UPPER EXTREMITY MMT: ?  ?06/17/2021 ?R shoulder strength of grossly 4 to 4+/5 throughout.  R hip strength of 4 to 4+/5, R hamstring of 4/5, R quad of 4+/5. ? ?04/22/2021 ?R shoulder strength of grossly 4 to 4+/5 throughout.  R hip strength of 4/5, R hamstring of 4/5, R quad of 4+/5. ? ?           ?TODAY'S TREATMENT:  ? ?06/17/2021: ?Nustep L5 x6 min with PT present to discuss status ?FOTO of shoulder 57% ?Lat Pull 50# 2x10 ?Tricep Pulldown 30# 2x10 ?Seated row 40# 2x10 ?Barre push ups 2x10 ?90 degree kitchen counter  stretch 2 x 20 sec ?Sit to/from stand holding 10# kettlebell:  x10 with chest press, x10 with overhead press ? ?06/10/2021: ?Lat Pull 50# 2x10 ?Tricep Pulldown 30# 2x10 ?Seated row 40# 2x10 ?90 degree kitchen counter stretch 2 x 20 sec  ?Barre push ups 2x10 ?Leg Press Bil 110# 2x10, single leg 55# x10 both seat 9 ?Sit to/from stand holding 10# kettlebell:  x10 with chest press, x10 with overhead press ?Shoulder green band horizontal abduction, bilat ER 2x10 ?Spine twist in small hinge holding 10# wt 2x5 ?  ?06/03/2021: ?Nustep L5 x6 min with PT present to discuss status ?Standing hamstring stretch BIl 2x20 sec ?Standing quad stretch bil 2x20 sec ?Lat Pull 50# 2x10 ?Tricep Pulldown 30# 2x10 ?Seated row 40# 2x10 ?90 degree kitchen counter stretch 2 x 20 sec  ?Barre push ups 2x10 ?Leg Press Bil 110# 2x10, single leg 55# x10 both seat 9 ?Sit to/from stand holding 15# kettlebell:  x10 with chest press, x10 with overhead press ?Shoulder green band horizontal abduction, bilat ER 2x10 ? ? ?PATIENT EDUCATION: ?Education details: Pt educated on the importance of continued HEP. ?Person educated: Patient ?Education method: Explanation ?Education comprehension: verbalized understanding ?  ?  ?HOME EXERCISE PROGRAM: ?Access Code: [email or web address] ?URL: [email or web address] ?Date: 04/22/2021 ?Prepared by: Juel Burrow ?  ?Exercises ?Hip Flexor Stretch on Step - 1 x daily - 7 x weekly - 1 sets - 10 reps ?Standing Sports administrator with Table and Chair Support - 2 x daily - 7 x weekly - 1 sets - 3 reps - 30 sec hold ?Long Sitting Quad Set - 2 x daily - 7 x weekly - 1 sets - 20 reps - 2-3 sec hold ?Supine Knee Extension Strengthening - 2 x daily - 7 x weekly - 1 sets - 20 reps ?Small Range Straight Leg Raise - 2 x daily - 7 x weekly - 1 sets - 20 reps ?Supine Bridge - 1 x daily - 7 x weekly - 1 sets - 10 reps ?Hooklying Isometric Hip Flexion - 1 x daily - 7 x weekly - 1 sets - 10 reps - 5 hold ?Sidelying Hip Abduction - 1 x daily - 7  x weekly - 1 sets - 10 reps ?Sit to Stand Without Arm Support - 1 x daily - 7 x weekly - 1 sets - 10 reps ?Standing Shoulder Row with Anchored Resistance - 1-2 x daily - 7 x weekly - 2 sets - 10 reps ?Shoulder extension with resistance - Neutral - 1-2 x daily - 7 x weekly - 2 sets - 10 reps ?Wall Push Up - 1-2 x daily - 7 x weekly - 2 sets - 10 reps ?Sit to Stand Without Arm Support - 1  x daily - 7 x weekly - 2 sets - 10 reps ?  ?  ?ASSESSMENT: ?  ?CLINICAL IMPRESSION: ?Pt reports that he is currently able to walk his dog for approx 10 minutes at a time before he stops. Pt states that he tripped in his garden Saturday morning, but did not hurt himself, but was a little sore over the weekend. Since fall, pt is reporting some gluteal soreness.  Right shoulder A/ROM has increased to [facility].  Pt continues to have most difficulty at this time with R shoulder weakness and pain with mobility and has been unable to return to playing golf.  Mr Orlich has not yet met his goals and would benefit from an additional PT for 1-2x/week for 8 weeks to progress towards goal related activities and being able to perform functional activities without an increase in pain. ?  ?OBJECTIVE IMPAIRMENTS decreased activity tolerance, decreased balance, difficulty walking, decreased ROM, decreased strength, increased muscle spasms, impaired flexibility, impaired UE functional use, improper body mechanics, postural dysfunction, and pain.  ?  ?ACTIVITY LIMITATIONS community activity and yard work.  ?  ?PERSONAL FACTORS 1-2 comorbidities: DM with neuropathy; chronic LBP/hip pain  are also affecting patient's functional outcome.  ?  ?  ?REHAB POTENTIAL: Good ?  ?CLINICAL DECISION MAKING: Evolving/moderate complexity ?  ?EVALUATION COMPLEXITY: Moderate ?  ?  ?GOALS: ?Goals reviewed with patient? Yes ?  ?SHORT TERM GOALS: ?  ?STG Name Target Date Goal status  ?1 Pt will be I and compliant with initial HEP.  ?Baseline:  05/06/2021 MET  ?2 Patient will  have increased HS length to 65 degrees bil and hip extension to 10 degrees bil  ?Baseline:  05/06/2021 MET  ?3 The patient will rise sit to stand 5x in 16 sec or less  ?Baseline: 05/06/2021 MET  ?4 Pt will report ab

## 2021-06-17 NOTE — Progress Notes (Signed)
?  Noah Lane - 66 y.o. male MRN [id number]  Date of birth: 04/04/1955 ? ?SUBJECTIVE:  Including CC & ROS.  ?No chief complaint on file. ? ? ?Noah Lane is a 66 y.o. male that is presenting for shockwave therapy. ? ? ? ?Review of Systems ?See HPI  ? ?HISTORY: Past Medical, Surgical, Social, and Family History Reviewed & Updated per EMR.   ?Pertinent Historical Findings include: ? ?Past Medical History:  ?Diagnosis Date  ? Diabetes mellitus without complication ([place])   ? ? ?Past Surgical History:  ?Procedure Laterality Date  ? HERNIA REPAIR  2012  ? ? ? ?PHYSICAL EXAM:  ?VS: Ht '6\' 1"'$  (1.854 m)   Wt 235 lb (106.6 kg)   BMI 31.00 kg/m?  ?Physical Exam ?Gen: NAD, alert, cooperative with exam, well-appearing ?MSK:  ?Neurovascularly intact   ? ?ECSWT Note ?Noah Lane ?1955/09/13 ? ?Procedure: ECSWT ?Indications: right shoulder pain ? ?Procedure Details ?Consent: Risks of procedure as well as the alternatives and risks of each were explained to the (patient/caregiver).  Consent for procedure obtained. ?Time Out: Verified patient identification, verified procedure, site/side was marked, verified correct patient position, special equipment/implants available, medications/allergies/relevent history reviewed, required imaging and test results available.  Performed.  The area was cleaned with iodine and alcohol swabs.   ? ?The right shoulder pain was targeted for Extracorporeal shockwave therapy.  ? ?Preset: shoulder problems ?Power Level: 90 ?Frequency: 10 ?Impulse/cycles: 2300 ?Head size: medium  ?Session: 4th ? ?Patient did tolerate procedure well. ? ? ? ?ASSESSMENT & PLAN:  ? ?Rotator cuff tendinitis, right ?Completed shockwave therapy.  ? ? ? ? ?

## 2021-06-17 NOTE — Assessment & Plan Note (Signed)
Completed shockwave therapy  

## 2021-06-17 NOTE — Addendum Note (Signed)
Addended by: Clearance Coots E on: 06/17/2021 01:03 PM ? ? Modules accepted: Orders ? ?

## 2021-06-18 ENCOUNTER — Other Ambulatory Visit: Payer: Self-pay | Admitting: Family Medicine

## 2021-06-18 ENCOUNTER — Encounter: Payer: Self-pay | Admitting: Family Medicine

## 2021-06-18 MED ORDER — METFORMIN HCL ER 500 MG PO TB24
500.0000 mg | ORAL_TABLET | Freq: Every day | ORAL | 2 refills | Status: DC
Start: 2021-06-18 — End: 2022-03-08

## 2021-06-24 ENCOUNTER — Ambulatory Visit: Payer: Medicare Other | Admitting: Rehabilitative and Restorative Service Providers"

## 2021-06-24 ENCOUNTER — Encounter: Payer: Self-pay | Admitting: Rehabilitative and Restorative Service Providers"

## 2021-06-24 DIAGNOSIS — M545 Low back pain, unspecified: Secondary | ICD-10-CM

## 2021-06-24 DIAGNOSIS — M25562 Pain in left knee: Secondary | ICD-10-CM | POA: Diagnosis not present

## 2021-06-24 DIAGNOSIS — M25552 Pain in left hip: Secondary | ICD-10-CM

## 2021-06-24 DIAGNOSIS — R262 Difficulty in walking, not elsewhere classified: Secondary | ICD-10-CM

## 2021-06-24 DIAGNOSIS — G8929 Other chronic pain: Secondary | ICD-10-CM

## 2021-06-24 DIAGNOSIS — M6281 Muscle weakness (generalized): Secondary | ICD-10-CM

## 2021-06-24 DIAGNOSIS — M25551 Pain in right hip: Secondary | ICD-10-CM

## 2021-06-24 NOTE — Therapy (Signed)
?OUTPATIENT PHYSICAL THERAPY TREATMENT NOTE ? ? ?Patient Name: Noah Lane ?MRN: [id number] ?DOB:02-Feb-1956, 66 y.o., male ?Today's Date: 06/24/2021 ? ?PCP: [name], DO ?REFERRING PROVIDER: Dr Clearance Coots ? ? PT End of Session - 06/24/21 0748   ? ? Visit Number 28   ? Date for PT Re-Evaluation 08/14/21   ? Authorization Type Medicare 30th visit progress note   ? PT Start Time 0730   ? PT Stop Time 0810   ? PT Time Calculation (min) 40 min   ? Activity Tolerance Patient tolerated treatment well   ? Behavior During Therapy [facility] for tasks assessed/performed   ? ?  ?  ? ?  ? ? ? ?Past Medical History:  ?Diagnosis Date  ? Diabetes mellitus without complication ([place])   ? ?Past Surgical History:  ?Procedure Laterality Date  ? HERNIA REPAIR  2012  ? ?Patient Active Problem List  ? Diagnosis Date Noted  ? Patellofemoral pain syndrome of both knees 02/24/2021  ? Rotator cuff tendinitis, right 12/28/2019  ? Hip pain, bilateral 05/28/2019  ? Chronic bilateral low back pain without sciatica 05/28/2019  ? Diabetes mellitus type 2 in obese ([place]) 05/28/2019  ? Erectile dysfunction 05/28/2019  ? Pain and swelling of left knee 03/12/2019  ? Lateral epicondylitis, right elbow 03/15/2017  ? ? ?REFERRING DIAG: Rotator cuff tendinitis, right. bil patellofemoral pain ?  ? ?THERAPY DIAG:  ?Chronic pain of left knee ? ?Chronic pain of right knee ? ?Muscle weakness (generalized) ? ?Difficulty in walking, not elsewhere classified ? ?Acute bilateral low back pain without sciatica ? ?Pain in right hip ? ?Pain in left hip ? ?Chronic right shoulder pain ? ?PERTINENT HISTORY: DM with neuropathy, chronic LBP/hip pain ? ?PRECAUTIONS: None ? ?SUBJECTIVE: "I want to try dry needling today" ? ?PAIN:  ?Are you having pain? Yes ?NPRS scale: 6/10 ?Pain location: Right Shoulder ?PAIN TYPE: aching, throbbing ?Pain description: intermittent  ? ?OBJECTIVE:  ?  ?DIAGNOSTIC FINDINGS:  ?Shoulder MRI on 03/15/21:  ?1. Moderate anterior greater  than mid AP dimension of the ?infraspinatus tendinosis. Small partial-thickness midsubstance tears ?of the anterior infraspinatus tendon both proximally and distally. ?The distal anterior infraspinatus tendon tear minimally extends ?through the bursal tendon surface. ?2. Mild superior subscapularis tendinosis. ?3. 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 ?Type 3 acromion. ?4. Mild subacromial/subdeltoid bursitis. ?5. Mild-to-moderate glenohumeral cartilage degenerative changes with ?posteroinferior glenoid labrum likely degenerative tear. ?  ?PATIENT SURVEYS:  ?FOTO :  Shoulder 60%, Knee 65% ?FOTO: 06/10/21: shoulder 57%, knee 61% ?  ?COGNITION: ?         Overall cognitive status: Within functional limits for tasks assessed ?  ?POSTURE: ?Forward head, rounded shoulder ?  ?UPPER EXTREMITY AROM/PROM: ?  ?A/PROM Right ?04/22/2021 Left ?04/22/2021 Right ?05/20/2021 Right ?06/17/2021  ?Shoulder flexion 130 150 140 145  ?Shoulder extension        ?Shoulder abduction 120 135 142 146  ?Shoulder adduction        ?Shoulder internal rotation      WFL  ?Shoulder external rotation      WFL  ?Elbow flexion        ?Elbow extension        ?Wrist flexion        ?Wrist extension        ?Wrist ulnar deviation        ?Wrist radial deviation        ?Wrist pronation        ?Wrist supination        ?(  Blank rows = not tested) ?  ?UPPER EXTREMITY MMT: ?  ?06/17/2021 ?R shoulder strength of grossly 4 to 4+/5 throughout.  R hip strength of 4 to 4+/5, R hamstring of 4/5, R quad of 4+/5. ? ?04/22/2021 ?R shoulder strength of grossly 4 to 4+/5 throughout.  R hip strength of 4/5, R hamstring of 4/5, R quad of 4+/5. ? ?           ?TODAY'S TREATMENT:  ? ?06/24/2021: ?Bilateral piriformis stretch in supine 3x20 sec ?Trigger Point Dry-Needling  ?Treatment instructions: Expect mild to moderate muscle soreness. S/S of pneumothorax if dry needled over a lung field, and to seek immediate medical attention should they occur. Patient  verbalized understanding of these instructions and education. ?Patient Consent Given: Yes ?Education handout provided: Yes ?Muscles treated: Right delts, R pec, R upper trap ?Treatment response/outcome: twitch response and muscle elongation ? Seated row 40# 2x10 ?Tricep Pulldown 30# 2x10 ?Lat Pull 55# 2x10 ?90 degree kitchen counter stretch 2 x 20 sec ?Barre push ups 2x10 ?Seated backwards leaning core twist with 10# kettlebell 2x10 bilat ?Sit to/from stand holding 10# kettlebell:  x10 with chest press, x10 with overhead press ? ?06/17/2021: ?Nustep L5 x6 min with PT present to discuss status ?FOTO of shoulder 57% ?Lat Pull 50# 2x10 ?Tricep Pulldown 30# 2x10 ?Seated row 40# 2x10 ?Barre push ups 2x10 ?90 degree kitchen counter stretch 2 x 20 sec ?Sit to/from stand holding 10# kettlebell:  x10 with chest press, x10 with overhead press ? ?06/10/2021: ?Lat Pull 50# 2x10 ?Tricep Pulldown 30# 2x10 ?Seated row 40# 2x10 ?90 degree kitchen counter stretch 2 x 20 sec  ?Barre push ups 2x10 ?Leg Press Bil 110# 2x10, single leg 55# x10 both seat 9 ?Sit to/from stand holding 10# kettlebell:  x10 with chest press, x10 with overhead press ?Shoulder green band horizontal abduction, bilat ER 2x10 ?Spine twist in small hinge holding 10# wt 2x5 ?  ? ?PATIENT EDUCATION: ?Education details: Pt educated on the importance of continued HEP. ?Person educated: Patient ?Education method: Explanation ?Education comprehension: verbalized understanding ?  ?  ?HOME EXERCISE PROGRAM: ?Access Code: [email or web address] ?URL: [email or web address] ?Date: 04/22/2021 ?Prepared by: Juel Burrow ?  ?Exercises ?Hip Flexor Stretch on Step - 1 x daily - 7 x weekly - 1 sets - 10 reps ?Standing Sports administrator with Table and Chair Support - 2 x daily - 7 x weekly - 1 sets - 3 reps - 30 sec hold ?Long Sitting Quad Set - 2 x daily - 7 x weekly - 1 sets - 20 reps - 2-3 sec hold ?Supine Knee Extension Strengthening - 2 x daily - 7 x weekly - 1 sets - 20 reps ?Small  Range Straight Leg Raise - 2 x daily - 7 x weekly - 1 sets - 20 reps ?Supine Bridge - 1 x daily - 7 x weekly - 1 sets - 10 reps ?Hooklying Isometric Hip Flexion - 1 x daily - 7 x weekly - 1 sets - 10 reps - 5 hold ?Sidelying Hip Abduction - 1 x daily - 7 x weekly - 1 sets - 10 reps ?Sit to Stand Without Arm Support - 1 x daily - 7 x weekly - 1 sets - 10 reps ?Standing Shoulder Row with Anchored Resistance - 1-2 x daily - 7 x weekly - 2 sets - 10 reps ?Shoulder extension with resistance - Neutral - 1-2 x daily - 7 x weekly - 2 sets - 10 reps ?Wall Push Up - 1-2  x daily - 7 x weekly - 2 sets - 10 reps ?Sit to Stand Without Arm Support - 1 x daily - 7 x weekly - 2 sets - 10 reps ?  ?  ?ASSESSMENT: ?  ?CLINICAL IMPRESSION: ?Mr Deskins presents to PT stating that he is wanting to try dry needling (DN) today to see if that will help with his pain.  Following DN, pt reports that his arm feels looser and he feels "better".  Pt able to progress with increased weight on lat pull without increased pain. Pt continues to require skilled PT to progress towards goal related activities. ?  ?OBJECTIVE IMPAIRMENTS decreased activity tolerance, decreased balance, difficulty walking, decreased ROM, decreased strength, increased muscle spasms, impaired flexibility, impaired UE functional use, improper body mechanics, postural dysfunction, and pain.  ?  ?ACTIVITY LIMITATIONS community activity and yard work.  ?  ?PERSONAL FACTORS 1-2 comorbidities: DM with neuropathy; chronic LBP/hip pain  are also affecting patient's functional outcome.  ?  ?  ?REHAB POTENTIAL: Good ?  ?CLINICAL DECISION MAKING: Evolving/moderate complexity ?  ?EVALUATION COMPLEXITY: Moderate ?  ?  ?GOALS: ?Goals reviewed with patient? Yes ?  ?SHORT TERM GOALS: ?  ?STG Name Target Date Goal status  ?1 Pt will be I and compliant with initial HEP.  ?Baseline:  05/06/2021 MET  ?2 Patient will have increased HS length to 65 degrees bil and hip extension to 10 degrees bil   ?Baseline:  05/06/2021 MET  ?3 The patient will rise sit to stand 5x in 16 sec or less  ?Baseline: 05/06/2021 MET  ?4 Pt will report ability to walk 1/2 mile with pain/discomfort 3/10 or less  ?Baseline: 05/06/2021 M

## 2021-06-24 NOTE — Patient Instructions (Signed)

## 2021-06-25 ENCOUNTER — Other Ambulatory Visit: Payer: Self-pay | Admitting: Family Medicine

## 2021-06-26 ENCOUNTER — Encounter: Payer: Self-pay | Admitting: Family Medicine

## 2021-07-01 ENCOUNTER — Encounter: Payer: Self-pay | Admitting: Rehabilitative and Restorative Service Providers"

## 2021-07-01 ENCOUNTER — Ambulatory Visit: Payer: Medicare Other | Attending: Family Medicine | Admitting: Rehabilitative and Restorative Service Providers"

## 2021-07-01 ENCOUNTER — Encounter: Payer: Self-pay | Admitting: Family Medicine

## 2021-07-01 DIAGNOSIS — M25552 Pain in left hip: Secondary | ICD-10-CM | POA: Diagnosis present

## 2021-07-01 DIAGNOSIS — G8929 Other chronic pain: Secondary | ICD-10-CM | POA: Diagnosis present

## 2021-07-01 DIAGNOSIS — M25511 Pain in right shoulder: Secondary | ICD-10-CM | POA: Diagnosis present

## 2021-07-01 DIAGNOSIS — M545 Low back pain, unspecified: Secondary | ICD-10-CM | POA: Diagnosis present

## 2021-07-01 DIAGNOSIS — M6281 Muscle weakness (generalized): Secondary | ICD-10-CM | POA: Diagnosis present

## 2021-07-01 DIAGNOSIS — M25562 Pain in left knee: Secondary | ICD-10-CM | POA: Insufficient documentation

## 2021-07-01 DIAGNOSIS — M25551 Pain in right hip: Secondary | ICD-10-CM | POA: Diagnosis present

## 2021-07-01 DIAGNOSIS — R262 Difficulty in walking, not elsewhere classified: Secondary | ICD-10-CM | POA: Insufficient documentation

## 2021-07-01 DIAGNOSIS — M25561 Pain in right knee: Secondary | ICD-10-CM | POA: Diagnosis present

## 2021-07-01 NOTE — Therapy (Signed)
?OUTPATIENT PHYSICAL THERAPY TREATMENT NOTE ? ? ?Patient Name: Noah Lane ?MRN: [id number] ?DOB:Sep 16, 1955, 66 y.o., male ?Today's Date: 07/01/2021 ? ?PCP: [name], DO ?REFERRING PROVIDER: Dr Clearance Coots ? ? PT End of Session - 07/01/21 0736   ? ? Visit Number 29   ? Date for PT Re-Evaluation 08/14/21   ? Authorization Type Medicare 30th visit progress note   ? Progress Note Due on Visit 30   ? PT Start Time 0730   ? PT Stop Time 0800   ? PT Time Calculation (min) 30 min   ? Activity Tolerance Patient tolerated treatment well   ? Behavior During Therapy [facility] for tasks assessed/performed   ? ?  ?  ? ?  ? ? ? ?Past Medical History:  ?Diagnosis Date  ? Diabetes mellitus without complication ([place])   ? ?Past Surgical History:  ?Procedure Laterality Date  ? HERNIA REPAIR  2012  ? ?Patient Active Problem List  ? Diagnosis Date Noted  ? Patellofemoral pain syndrome of both knees 02/24/2021  ? Rotator cuff tendinitis, right 12/28/2019  ? Hip pain, bilateral 05/28/2019  ? Chronic bilateral low back pain without sciatica 05/28/2019  ? Diabetes mellitus type 2 in obese ([place]) 05/28/2019  ? Erectile dysfunction 05/28/2019  ? Pain and swelling of left knee 03/12/2019  ? Lateral epicondylitis, right elbow 03/15/2017  ? ? ?REFERRING DIAG: Rotator cuff tendinitis, right. bil patellofemoral pain ?  ? ?THERAPY DIAG:  ?Chronic pain of left knee ? ?Chronic pain of right knee ? ?Muscle weakness (generalized) ? ?Difficulty in walking, not elsewhere classified ? ?Acute bilateral low back pain without sciatica ? ?Pain in right hip ? ?Pain in left hip ? ?Chronic right shoulder pain ? ?PERTINENT HISTORY: DM with neuropathy, chronic LBP/hip pain ? ?PRECAUTIONS: None ? ?SUBJECTIVE: Pt reports that he found great relief from dry needling, but is having pain again this morning. ? ?PAIN:  ?Are you having pain? Yes ?NPRS scale: 7/10 ?Pain location: Right Shoulder ?PAIN TYPE: aching, throbbing ?Pain description: intermittent   ? ?OBJECTIVE:  ?  ?DIAGNOSTIC FINDINGS:  ?Shoulder MRI on 03/15/21:  ?1. Moderate anterior greater than mid AP dimension of the ?infraspinatus tendinosis. Small partial-thickness midsubstance tears ?of the anterior infraspinatus tendon both proximally and distally. ?The distal anterior infraspinatus tendon tear minimally extends ?through the bursal tendon surface. ?2. Mild superior subscapularis tendinosis. ?3. 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 ?Type 3 acromion. ?4. Mild subacromial/subdeltoid bursitis. ?5. Mild-to-moderate glenohumeral cartilage degenerative changes with ?posteroinferior glenoid labrum likely degenerative tear. ?  ?PATIENT SURVEYS:  ?FOTO :  Shoulder 60%, Knee 65% ?FOTO: 06/10/21: shoulder 57%, knee 61% ?  ?COGNITION: ?         Overall cognitive status: Within functional limits for tasks assessed ?  ?POSTURE: ?Forward head, rounded shoulder ?  ?UPPER EXTREMITY AROM/PROM: ?  ?A/PROM Right ?04/22/2021 Left ?04/22/2021 Right ?05/20/2021 Right ?06/17/2021  ?Shoulder flexion 130 150 140 145  ?Shoulder extension        ?Shoulder abduction 120 135 142 146  ?Shoulder adduction        ?Shoulder internal rotation      WFL  ?Shoulder external rotation      WFL  ?Elbow flexion        ?Elbow extension        ?Wrist flexion        ?Wrist extension        ?Wrist ulnar deviation        ?Wrist radial deviation        ?  Wrist pronation        ?Wrist supination        ?(Blank rows = not tested) ?  ?UPPER EXTREMITY MMT: ?  ?06/17/2021 ?R shoulder strength of grossly 4 to 4+/5 throughout.  R hip strength of 4 to 4+/5, R hamstring of 4/5, R quad of 4+/5. ? ?04/22/2021 ?R shoulder strength of grossly 4 to 4+/5 throughout.  R hip strength of 4/5, R hamstring of 4/5, R quad of 4+/5. ? ?           ?TODAY'S TREATMENT:  ? ?07/01/2021: ?Sit to/from stand holding 10# kettlebell:  x10 with chest press, x10 with overhead press ?Leg Press Bil 110# 2x10,  seat 9 ?Seated row 40# 2x10 ?Tricep Pulldown 30# 2x10 ?Lat  Pull 55# 2x10 ?DRY NEEDLING: ?Dry needling consent given? yes ?Educational handouts provided? yes ?Muscles treated: Right delts, R pec, R upper trap, R lats, R rhomboids ?Response from dry needling: twitch response and muscle elongation.  Utilized skilled palpation to identify locations of trigger points.  ? ?06/24/2021: ?Bilateral piriformis stretch in supine 3x20 sec ?Trigger Point Dry-Needling  ?Treatment instructions: Expect mild to moderate muscle soreness. S/S of pneumothorax if dry needled over a lung field, and to seek immediate medical attention should they occur. Patient verbalized understanding of these instructions and education. ?Patient Consent Given: Yes ?Education handout provided: Yes ?Muscles treated: Right delts, R pec, R upper trap ?Treatment response/outcome: twitch response and muscle elongation ?Seated row 40# 2x10 ?Tricep Pulldown 30# 2x10 ?Lat Pull 55# 2x10 ?90 degree kitchen counter stretch 2 x 20 sec ?Barre push ups 2x10 ?Seated backwards leaning core twist with 10# kettlebell 2x10 bilat ?Sit to/from stand holding 10# kettlebell:  x10 with chest press, x10 with overhead press ? ?06/17/2021: ?Nustep L5 x6 min with PT present to discuss status ?FOTO of shoulder 57% ?Lat Pull 50# 2x10 ?Tricep Pulldown 30# 2x10 ?Seated row 40# 2x10 ?Barre push ups 2x10 ?90 degree kitchen counter stretch 2 x 20 sec ?Sit to/from stand holding 10# kettlebell:  x10 with chest press, x10 with overhead press ? ? ?PATIENT EDUCATION: ?Education details: Pt educated on the importance of continued HEP. ?Person educated: Patient ?Education method: Explanation ?Education comprehension: verbalized understanding ?  ?  ?HOME EXERCISE PROGRAM: ?Access Code: [email or web address] ?URL: [email or web address] ?Date: 04/22/2021 ?Prepared by: Juel Burrow ?  ?Exercises ?Hip Flexor Stretch on Step - 1 x daily - 7 x weekly - 1 sets - 10 reps ?Standing Sports administrator with Table and Chair Support - 2 x daily - 7 x weekly - 1 sets - 3  reps - 30 sec hold ?Long Sitting Quad Set - 2 x daily - 7 x weekly - 1 sets - 20 reps - 2-3 sec hold ?Supine Knee Extension Strengthening - 2 x daily - 7 x weekly - 1 sets - 20 reps ?Small Range Straight Leg Raise - 2 x daily - 7 x weekly - 1 sets - 20 reps ?Supine Bridge - 1 x daily - 7 x weekly - 1 sets - 10 reps ?Hooklying Isometric Hip Flexion - 1 x daily - 7 x weekly - 1 sets - 10 reps - 5 hold ?Sidelying Hip Abduction - 1 x daily - 7 x weekly - 1 sets - 10 reps ?Sit to Stand Without Arm Support - 1 x daily - 7 x weekly - 1 sets - 10 reps ?Standing Shoulder Row with Anchored Resistance - 1-2 x daily - 7 x weekly - 2 sets - 10 reps ?  Shoulder extension with resistance - Neutral - 1-2 x daily - 7 x weekly - 2 sets - 10 reps ?Wall Push Up - 1-2 x daily - 7 x weekly - 2 sets - 10 reps ?Sit to Stand Without Arm Support - 1 x daily - 7 x weekly - 2 sets - 10 reps ?  ?  ?ASSESSMENT: ?  ?CLINICAL IMPRESSION: ?Noah Lane presents to PT continuing to report decreased pain following dry needling and wants to continue with that treatment option.  Following dry needling, pt with pain decreasing from 7/10 to 0/10.  Pt continues to progress with strengthening of his shoulder and improved overall posture. Pt continues to require skilled PT to progress towards goal related activities. ?  ?OBJECTIVE IMPAIRMENTS decreased activity tolerance, decreased balance, difficulty walking, decreased ROM, decreased strength, increased muscle spasms, impaired flexibility, impaired UE functional use, improper body mechanics, postural dysfunction, and pain.  ?  ?ACTIVITY LIMITATIONS community activity and yard work.  ?  ?PERSONAL FACTORS 1-2 comorbidities: DM with neuropathy; chronic LBP/hip pain  are also affecting patient's functional outcome.  ?  ?  ?REHAB POTENTIAL: Good ?  ?CLINICAL DECISION MAKING: Evolving/moderate complexity ?  ?EVALUATION COMPLEXITY: Moderate ?  ?  ?GOALS: ?Goals reviewed with patient? Yes ?  ?SHORT TERM GOALS: ?   ?STG Name Target Date Goal status  ?1 Pt will be I and compliant with initial HEP.  ?Baseline:  05/06/2021 MET  ?2 Patient will have increased HS length to 65 degrees bil and hip extension to 10 degrees bil  ?Clarise Cruz

## 2021-07-08 ENCOUNTER — Ambulatory Visit: Payer: Medicare Other | Admitting: Rehabilitative and Restorative Service Providers"

## 2021-07-08 ENCOUNTER — Encounter: Payer: Self-pay | Admitting: Rehabilitative and Restorative Service Providers"

## 2021-07-08 DIAGNOSIS — G8929 Other chronic pain: Secondary | ICD-10-CM

## 2021-07-08 DIAGNOSIS — M6281 Muscle weakness (generalized): Secondary | ICD-10-CM

## 2021-07-08 DIAGNOSIS — M25551 Pain in right hip: Secondary | ICD-10-CM

## 2021-07-08 DIAGNOSIS — M25552 Pain in left hip: Secondary | ICD-10-CM

## 2021-07-08 DIAGNOSIS — M25562 Pain in left knee: Secondary | ICD-10-CM | POA: Diagnosis not present

## 2021-07-08 DIAGNOSIS — R262 Difficulty in walking, not elsewhere classified: Secondary | ICD-10-CM

## 2021-07-08 DIAGNOSIS — M545 Low back pain, unspecified: Secondary | ICD-10-CM

## 2021-07-08 NOTE — Therapy (Signed)
?OUTPATIENT PHYSICAL THERAPY TREATMENT NOTE ? ? ?Patient Name: Noah Lane ?MRN: [id number] ?DOB:[birth year], 66 y.o., male ?Today's Date: 07/08/2021 ? ?PCP: [name], DO ?REFERRING PROVIDER: Dr Clearance Coots ? ?Progress Note ?Reporting Period 02/24/2022 to 07/08/2021 ? ?See note below for Objective Data and Assessment of Progress/Goals.  ? ? ? ? ? PT End of Session - 07/08/21 0758   ? ? Visit Number 30   ? Date for PT Re-Evaluation 08/14/21   ? Authorization Type Medicare 40th visit progress note   ? PT Start Time [phone]   ? PT Stop Time 0840   ? PT Time Calculation (min) 41 min   ? ?  ?  ? ?  ? ? ? ?Past Medical History:  ?Diagnosis Date  ? Diabetes mellitus without complication ([place])   ? ?Past Surgical History:  ?Procedure Laterality Date  ? HERNIA REPAIR  2012  ? ?Patient Active Problem List  ? Diagnosis Date Noted  ? Patellofemoral pain syndrome of both knees 02/24/2021  ? Rotator cuff tendinitis, right 12/28/2019  ? Hip pain, bilateral 05/28/2019  ? Chronic bilateral low back pain without sciatica 05/28/2019  ? Diabetes mellitus type 2 in obese ([place]) 05/28/2019  ? Erectile dysfunction 05/28/2019  ? Pain and swelling of left knee 03/12/2019  ? Lateral epicondylitis, right elbow 03/15/2017  ? ? ?REFERRING DIAG: Rotator cuff tendinitis, right. bil patellofemoral pain ?  ? ?THERAPY DIAG:  ?Chronic pain of left knee ? ?Chronic pain of right knee ? ?Muscle weakness (generalized) ? ?Difficulty in walking, not elsewhere classified ? ?Acute bilateral low back pain without sciatica ? ?Pain in right hip ? ?Pain in left hip ? ?Chronic right shoulder pain ? ?PERTINENT HISTORY: DM with neuropathy, chronic LBP/hip pain ? ?PRECAUTIONS: None ? ?SUBJECTIVE: Pt reports that he had a good trip in [place] and was able to use the gym at the hotel. ? ?PAIN:  ?Are you having pain? Yes ?NPRS scale: 5/10 ?Pain location: Right Shoulder ?PAIN TYPE: aching, throbbing ?Pain description: intermittent  ? ?OBJECTIVE:  ?  ?DIAGNOSTIC  FINDINGS:  ?Shoulder MRI on 03/15/21:  ?1. Moderate anterior greater than mid AP dimension of the ?infraspinatus tendinosis. Small partial-thickness midsubstance tears ?of the anterior infraspinatus tendon both proximally and distally. ?The distal anterior infraspinatus tendon tear minimally extends ?through the bursal tendon surface. ?2. Mild superior subscapularis tendinosis. ?3. 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 ?Type 3 acromion. ?4. Mild subacromial/subdeltoid bursitis. ?5. Mild-to-moderate glenohumeral cartilage degenerative changes with ?posteroinferior glenoid labrum likely degenerative tear. ?  ?PATIENT SURVEYS:  ?FOTO :  Shoulder 60%, Knee 65% ?FOTO on 06/10/21: shoulder 57%, knee 61% ?FOTO on 07/08/2021:  shoulder 58%, knee 56% ?  ?COGNITION: ?         Overall cognitive status: Within functional limits for tasks assessed ?  ?POSTURE: ?Forward head, rounded shoulder ?  ?UPPER EXTREMITY AROM/PROM: ?  ?A/PROM Right ?04/22/2021 Left ?04/22/2021 Right ?05/20/2021 Right ?06/17/2021  ?Shoulder flexion 130 150 140 145  ?Shoulder extension        ?Shoulder abduction 120 135 142 146  ?Shoulder adduction        ?Shoulder internal rotation      WFL  ?Shoulder external rotation      WFL  ?Elbow flexion        ?Elbow extension        ?Wrist flexion        ?Wrist extension        ?Wrist ulnar deviation        ?  Wrist radial deviation        ?Wrist pronation        ?Wrist supination        ?(Blank rows = not tested) ?  ?UPPER EXTREMITY MMT: ?  ?06/17/2021 ?R shoulder strength of grossly 4 to 4+/5 throughout.  R hip strength of 4 to 4+/5, R hamstring of 4/5, R quad of 4+/5. ? ?04/22/2021 ?R shoulder strength of grossly 4 to 4+/5 throughout.  R hip strength of 4/5, R hamstring of 4/5, R quad of 4+/5. ? ?           ?TODAY'S TREATMENT:  ? ?07/08/2021: ?Lat Pull 55# 2x10 ?Seated row 40# 2x10 ?Tricep Pulldown 30# 2x10 ?Sit to/from stand holding 10# kettlebell:  x10 with chest press, x10 with overhead press ?Leg  Press Bil 115# 2x10,  seat 9 ?Seated backwards leaning core twist with 10# kettlebell 2x10 bilat ?Seated crunches holding 10# kettlebell 2x10 ?90 degree kitchen counter stretch 5 x 15 sec ?Shoulder flexion and shoulder abduction with 4# dumbbells 2x10 bilat ?DRY NEEDLING: ?Dry needling consent given? yes ?Educational handouts provided? yes ?Muscles treated: Right delts, R pec, R upper trap, R lats, R rhomboids, R thoracic multifidi ?Response from dry needling: twitch response and muscle elongation.  Utilized skilled palpation to identify locations of trigger points.  ? ?07/01/2021: ?Sit to/from stand holding 10# kettlebell:  x10 with chest press, x10 with overhead press ?Leg Press Bil 110# 2x10,  seat 9 ?Seated row 40# 2x10 ?Tricep Pulldown 30# 2x10 ?Lat Pull 55# 2x10 ?DRY NEEDLING: ?Dry needling consent given? yes ?Educational handouts provided? yes ?Muscles treated: Right delts, R pec, R upper trap, R lats, R rhomboids ?Response from dry needling: twitch response and muscle elongation.  Utilized skilled palpation to identify locations of trigger points.  ? ?06/24/2021: ?Bilateral piriformis stretch in supine 3x20 sec ?Trigger Point Dry-Needling  ?Treatment instructions: Expect mild to moderate muscle soreness. S/S of pneumothorax if dry needled over a lung field, and to seek immediate medical attention should they occur. Patient verbalized understanding of these instructions and education. ?Patient Consent Given: Yes ?Education handout provided: Yes ?Muscles treated: Right delts, R pec, R upper trap ?Treatment response/outcome: twitch response and muscle elongation ?Seated row 40# 2x10 ?Tricep Pulldown 30# 2x10 ?Lat Pull 55# 2x10 ?90 degree kitchen counter stretch 2 x 20 sec ?Barre push ups 2x10 ?Seated backwards leaning core twist with 10# kettlebell 2x10 bilat ?Sit to/from stand holding 10# kettlebell:  x10 with chest press, x10 with overhead press ? ?06/17/2021: ?Nustep L5 x6 min with PT present to discuss  status ?FOTO of shoulder 57% ?Lat Pull 50# 2x10 ?Tricep Pulldown 30# 2x10 ?Seated row 40# 2x10 ?Barre push ups 2x10 ?90 degree kitchen counter stretch 2 x 20 sec ?Sit to/from stand holding 10# kettlebell:  x10 with chest press, x10 with overhead press ? ? ?PATIENT EDUCATION: ?Education details: Pt educated on the importance of continued HEP. ?Person educated: Patient ?Education method: Explanation ?Education comprehension: verbalized understanding ?  ?  ?HOME EXERCISE PROGRAM: ?Access Code: [email or web address] ?URL: [email or web address] ?Date: 04/22/2021 ?Prepared by: Juel Burrow ?  ?Exercises ?Hip Flexor Stretch on Step - 1 x daily - 7 x weekly - 1 sets - 10 reps ?Standing Sports administrator with Table and Chair Support - 2 x daily - 7 x weekly - 1 sets - 3 reps - 30 sec hold ?Long Sitting Quad Set - 2 x daily - 7 x weekly - 1 sets - 20 reps - 2-3 sec hold ?Supine Knee  Extension Strengthening - 2 x daily - 7 x weekly - 1 sets - 20 reps ?Small Range Straight Leg Raise - 2 x daily - 7 x weekly - 1 sets - 20 reps ?Supine Bridge - 1 x daily - 7 x weekly - 1 sets - 10 reps ?Hooklying Isometric Hip Flexion - 1 x daily - 7 x weekly - 1 sets - 10 reps - 5 hold ?Sidelying Hip Abduction - 1 x daily - 7 x weekly - 1 sets - 10 reps ?Sit to Stand Without Arm Support - 1 x daily - 7 x weekly - 1 sets - 10 reps ?Standing Shoulder Row with Anchored Resistance - 1-2 x daily - 7 x weekly - 2 sets - 10 reps ?Shoulder extension with resistance - Neutral - 1-2 x daily - 7 x weekly - 2 sets - 10 reps ?Wall Push Up - 1-2 x daily - 7 x weekly - 2 sets - 10 reps ?Sit to Stand Without Arm Support - 1 x daily - 7 x weekly - 2 sets - 10 reps ?  ?  ?ASSESSMENT: ?  ?CLINICAL IMPRESSION: ?Mr Zapata presents to PT reports a great relief when he has a twitch response during dry needling.  Following dry needling, pt reports pain decreased to 3/10.  Pt has made improvements on his shoulder FOTO and overall reports that he feels like he could go  swing a golf club following dry needling treatment.  Pt continues to require skilled PT to progress towards goal related activities. ?  ?OBJECTIVE IMPAIRMENTS decreased activity tolerance, decreased balance,

## 2021-07-15 ENCOUNTER — Ambulatory Visit: Payer: Medicare Other | Admitting: Rehabilitative and Restorative Service Providers"

## 2021-07-15 ENCOUNTER — Encounter: Payer: Self-pay | Admitting: Rehabilitative and Restorative Service Providers"

## 2021-07-15 DIAGNOSIS — M25551 Pain in right hip: Secondary | ICD-10-CM

## 2021-07-15 DIAGNOSIS — G8929 Other chronic pain: Secondary | ICD-10-CM

## 2021-07-15 DIAGNOSIS — M25552 Pain in left hip: Secondary | ICD-10-CM

## 2021-07-15 DIAGNOSIS — M6281 Muscle weakness (generalized): Secondary | ICD-10-CM

## 2021-07-15 DIAGNOSIS — M545 Low back pain, unspecified: Secondary | ICD-10-CM

## 2021-07-15 DIAGNOSIS — M25562 Pain in left knee: Secondary | ICD-10-CM | POA: Diagnosis not present

## 2021-07-15 DIAGNOSIS — R262 Difficulty in walking, not elsewhere classified: Secondary | ICD-10-CM

## 2021-07-15 NOTE — Therapy (Signed)
?OUTPATIENT PHYSICAL THERAPY TREATMENT NOTE ? ? ?Patient Name: Noah Lane ?MRN: [id number] ?DOB:[birth year], 66 y.o., male ?Today's Date: 07/15/2021 ? ?PCP: [name], DO ?REFERRING PROVIDER: Dr Clearance Coots ? ?Progress Note ?Reporting Period 02/24/2022 to 07/08/2021 ? ?See note below for Objective Data and Assessment of Progress/Goals.  ? ? ? ? ? PT End of Session - 07/15/21 0802   ? ? Visit Number 31   ? Date for PT Re-Evaluation 08/14/21   ? Authorization Type Medicare 40th visit progress note   ? Progress Note Due on Visit 40   ? PT Start Time 0801   ? PT Stop Time 0840   ? PT Time Calculation (min) 39 min   ? Activity Tolerance Patient tolerated treatment well   ? Behavior During Therapy [facility] for tasks assessed/performed   ? ?  ?  ? ?  ? ? ? ?Past Medical History:  ?Diagnosis Date  ? Diabetes mellitus without complication ([place])   ? ?Past Surgical History:  ?Procedure Laterality Date  ? HERNIA REPAIR  2012  ? ?Patient Active Problem List  ? Diagnosis Date Noted  ? Patellofemoral pain syndrome of both knees 02/24/2021  ? Rotator cuff tendinitis, right 12/28/2019  ? Hip pain, bilateral 05/28/2019  ? Chronic bilateral low back pain without sciatica 05/28/2019  ? Diabetes mellitus type 2 in obese ([place]) 05/28/2019  ? Erectile dysfunction 05/28/2019  ? Pain and swelling of left knee 03/12/2019  ? Lateral epicondylitis, right elbow 03/15/2017  ? ? ?REFERRING DIAG: Rotator cuff tendinitis, right. bil patellofemoral pain ?  ? ?THERAPY DIAG:  ?Chronic pain of left knee ? ?Chronic pain of right knee ? ?Muscle weakness (generalized) ? ?Difficulty in walking, not elsewhere classified ? ?Acute bilateral low back pain without sciatica ? ?Pain in right hip ? ?Pain in left hip ? ?Chronic right shoulder pain ? ?PERTINENT HISTORY: DM with neuropathy, chronic LBP/hip pain ? ?PRECAUTIONS: None ? ?SUBJECTIVE: Pt reports having some pain in his shoulder this morning. ? ?PAIN:  ?Are you having pain? Yes ?NPRS scale:  7/10 ?Pain location: Right Shoulder ?PAIN TYPE: aching, throbbing ?Pain description: intermittent  ? ?OBJECTIVE:  ?  ?DIAGNOSTIC FINDINGS:  ?Shoulder MRI on 03/15/21:  ?1. Moderate anterior greater than mid AP dimension of the ?infraspinatus tendinosis. Small partial-thickness midsubstance tears ?of the anterior infraspinatus tendon both proximally and distally. ?The distal anterior infraspinatus tendon tear minimally extends ?through the bursal tendon surface. ?2. Mild superior subscapularis tendinosis. ?3. 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 ?Type 3 acromion. ?4. Mild subacromial/subdeltoid bursitis. ?5. Mild-to-moderate glenohumeral cartilage degenerative changes with ?posteroinferior glenoid labrum likely degenerative tear. ?  ?PATIENT SURVEYS:  ?FOTO :  Shoulder 60%, Knee 65% ?FOTO on 06/10/21: shoulder 57%, knee 61% ?FOTO on 07/08/2021:  shoulder 58%, knee 56% ?  ?COGNITION: ?         Overall cognitive status: Within functional limits for tasks assessed ?  ?POSTURE: ?Forward head, rounded shoulder ?  ?UPPER EXTREMITY AROM/PROM: ?  ?A/PROM Right ?04/22/2021 Left ?04/22/2021 Right ?05/20/2021 Right ?06/17/2021  ?Shoulder flexion 130 150 140 145  ?Shoulder extension        ?Shoulder abduction 120 135 142 146  ?Shoulder adduction        ?Shoulder internal rotation      WFL  ?Shoulder external rotation      WFL  ?Elbow flexion        ?Elbow extension        ?Wrist flexion        ?  Wrist extension        ?Wrist ulnar deviation        ?Wrist radial deviation        ?Wrist pronation        ?Wrist supination        ?(Blank rows = not tested) ?  ?UPPER EXTREMITY MMT: ?  ?06/17/2021 ?R shoulder strength of grossly 4 to 4+/5 throughout.  R hip strength of 4 to 4+/5, R hamstring of 4/5, R quad of 4+/5. ? ?04/22/2021 ?R shoulder strength of grossly 4 to 4+/5 throughout.  R hip strength of 4/5, R hamstring of 4/5, R quad of 4+/5. ? ?           ?TODAY'S TREATMENT:  ? ?07/15/2021: ?Seated long sitting hamstring  stretch 2x20 sec ?Sit to/from stand holding 10# kettlebell:  x10 with chest press, x10 with overhead press ?Seated crunches holding 10# kettlebell 2x10 ?Seated backwards leaning core twist with 10# kettlebell 2x10 bilat ?Lat Pull 55# 2x10 ?Seated row 40# 2x10 ?DRY NEEDLING: ?Dry needling consent given? yes ?Educational handouts provided? yes ?Muscles treated: Right delts, R pec, R upper trap, R lats, R rhomboids ?Response from dry needling: twitch response and muscle elongation.  Utilized skilled palpation to identify locations of trigger points.  ? ?07/08/2021: ?Lat Pull 55# 2x10 ?Seated row 40# 2x10 ?Tricep Pulldown 30# 2x10 ?Sit to/from stand holding 10# kettlebell:  x10 with chest press, x10 with overhead press ?Leg Press Bil 115# 2x10,  seat 9 ?Seated backwards leaning core twist with 10# kettlebell 2x10 bilat ?Seated crunches holding 10# kettlebell 2x10 ?90 degree kitchen counter stretch 5 x 15 sec ?Shoulder flexion and shoulder abduction with 4# dumbbells 2x10 bilat ?DRY NEEDLING: ?Dry needling consent given? yes ?Educational handouts provided? yes ?Muscles treated: Right delts, R pec, R upper trap, R lats, R rhomboids, R thoracic multifidi ?Response from dry needling: twitch response and muscle elongation.  Utilized skilled palpation to identify locations of trigger points.  ? ?07/01/2021: ?Sit to/from stand holding 10# kettlebell:  x10 with chest press, x10 with overhead press ?Leg Press Bil 110# 2x10,  seat 9 ?Seated row 40# 2x10 ?Tricep Pulldown 30# 2x10 ?Lat Pull 55# 2x10 ?DRY NEEDLING: ?Dry needling consent given? yes ?Educational handouts provided? yes ?Muscles treated: Right delts, R pec, R upper trap, R lats, R rhomboids ?Response from dry needling: twitch response and muscle elongation.  Utilized skilled palpation to identify locations of trigger points.  ? ? ? ?PATIENT EDUCATION: ?Education details: Pt educated on the importance of continued HEP. ?Person educated: Patient ?Education method:  Explanation ?Education comprehension: verbalized understanding ?  ?  ?HOME EXERCISE PROGRAM: ?Access Code: [email or web address] ?URL: [email or web address] ?Date: 04/22/2021 ?Prepared by: Juel Burrow ?  ?Exercises ?Hip Flexor Stretch on Step - 1 x daily - 7 x weekly - 1 sets - 10 reps ?Standing Sports administrator with Table and Chair Support - 2 x daily - 7 x weekly - 1 sets - 3 reps - 30 sec hold ?Long Sitting Quad Set - 2 x daily - 7 x weekly - 1 sets - 20 reps - 2-3 sec hold ?Supine Knee Extension Strengthening - 2 x daily - 7 x weekly - 1 sets - 20 reps ?Small Range Straight Leg Raise - 2 x daily - 7 x weekly - 1 sets - 20 reps ?Supine Bridge - 1 x daily - 7 x weekly - 1 sets - 10 reps ?Hooklying Isometric Hip Flexion - 1 x daily - 7 x weekly - 1 sets -  10 reps - 5 hold ?Sidelying Hip Abduction - 1 x daily - 7 x weekly - 1 sets - 10 reps ?Sit to Stand Without Arm Support - 1 x daily - 7 x weekly - 1 sets - 10 reps ?Standing Shoulder Row with Anchored Resistance - 1-2 x daily - 7 x weekly - 2 sets - 10 reps ?Shoulder extension with resistance - Neutral - 1-2 x daily - 7 x weekly - 2 sets - 10 reps ?Wall Push Up - 1-2 x daily - 7 x weekly - 2 sets - 10 reps ?Sit to Stand Without Arm Support - 1 x daily - 7 x weekly - 2 sets - 10 reps ?  ?  ?ASSESSMENT: ?  ?CLINICAL IMPRESSION: ?Mr Godown presents to PT reporting compliance with HEP and ready to participate.  Following dry needling and PT session, pt reports pain decreases to 3/10.  Pt encouraged to try to go out to the driving range and practice hitting the golf ball to assess how his shoulder does with the motion, as pt states that he has not accepted his friend's request to go golfing yet.  Pt only require minimal cuing during therex for technique and pacing.  Pt continues to require skilled PT to progress towards goal related activities and assess how he is performing with functional tasks. ?  ?OBJECTIVE IMPAIRMENTS decreased activity tolerance, decreased  balance, difficulty walking, decreased ROM, decreased strength, increased muscle spasms, impaired flexibility, impaired UE functional use, improper body mechanics, postural dysfunction, and pain.  ?  ?ACTIVITY LIMITAT

## 2021-07-22 ENCOUNTER — Ambulatory Visit: Payer: Medicare Other | Admitting: Rehabilitative and Restorative Service Providers"

## 2021-07-22 ENCOUNTER — Encounter: Payer: Self-pay | Admitting: Rehabilitative and Restorative Service Providers"

## 2021-07-22 ENCOUNTER — Ambulatory Visit: Payer: Self-pay

## 2021-07-22 ENCOUNTER — Encounter: Payer: Self-pay | Admitting: Family Medicine

## 2021-07-22 ENCOUNTER — Ambulatory Visit (INDEPENDENT_AMBULATORY_CARE_PROVIDER_SITE_OTHER): Payer: Medicare Other | Admitting: Family Medicine

## 2021-07-22 VITALS — BP 120/70 | Ht 73.0 in | Wt 235.0 lb

## 2021-07-22 DIAGNOSIS — M545 Low back pain, unspecified: Secondary | ICD-10-CM

## 2021-07-22 DIAGNOSIS — G8929 Other chronic pain: Secondary | ICD-10-CM

## 2021-07-22 DIAGNOSIS — S86819A Strain of other muscle(s) and tendon(s) at lower leg level, unspecified leg, initial encounter: Secondary | ICD-10-CM | POA: Insufficient documentation

## 2021-07-22 DIAGNOSIS — M6281 Muscle weakness (generalized): Secondary | ICD-10-CM

## 2021-07-22 DIAGNOSIS — R262 Difficulty in walking, not elsewhere classified: Secondary | ICD-10-CM

## 2021-07-22 DIAGNOSIS — M79661 Pain in right lower leg: Secondary | ICD-10-CM

## 2021-07-22 DIAGNOSIS — S86811A Strain of other muscle(s) and tendon(s) at lower leg level, right leg, initial encounter: Secondary | ICD-10-CM

## 2021-07-22 DIAGNOSIS — M25551 Pain in right hip: Secondary | ICD-10-CM

## 2021-07-22 DIAGNOSIS — M25562 Pain in left knee: Secondary | ICD-10-CM | POA: Diagnosis not present

## 2021-07-22 DIAGNOSIS — M25552 Pain in left hip: Secondary | ICD-10-CM

## 2021-07-22 NOTE — Assessment & Plan Note (Signed)
Acutely occurring.  Having a mid belly strain in the medial gastrocnemius.  Baker's cyst is incidentally found. -Counseled on home exercise therapy and supportive care. -Counseled on compression. -Referral to physical therapy. -Could consider shockwave therapy.

## 2021-07-22 NOTE — Progress Notes (Signed)
  Jeanpaul Biehl - 66 y.o. male MRN [id number]  Date of birth: September 24, 1955  SUBJECTIVE:  Including CC & ROS.  No chief complaint on file.   Antino Mayabb is a 66 y.o. male that is presenting with acute right calf pain.  A few days ago he had a trip.  He caught himself with his leg but had subsequent calf pain.  No history of similar pain.    Review of Systems See HPI   HISTORY: Past Medical, Surgical, Social, and Family History Reviewed & Updated per EMR.   Pertinent Historical Findings include:  Past Medical History:  Diagnosis Date   Diabetes mellitus without complication ([place])     Past Surgical History:  Procedure Laterality Date   HERNIA REPAIR  2012     PHYSICAL EXAM:  VS: BP 120/70 (BP Location: Left Arm, Patient Position: Sitting)   Ht '6\' 1"'$  (1.854 m)   Wt 235 lb (106.6 kg)   BMI 31.00 kg/m  Physical Exam Gen: NAD, alert, cooperative with exam, well-appearing MSK:  Neurovascularly intact    Limited ultrasound: Right calf:  There appears to be an irregularity within the mid belly of the medial gastrocnemius.  There is associated hyperemia. Has a moderate Baker's cyst. Normal-appearing musculotendinous junction  Summary: Findings consistent with medial gastrocnemius strain.  Ultrasound and interpretation by Clearance Coots, MD    ASSESSMENT & PLAN:   Strain of calf muscle Acutely occurring.  Having a mid belly strain in the medial gastrocnemius.  Baker's cyst is incidentally found. -Counseled on home exercise therapy and supportive care. -Counseled on compression. -Referral to physical therapy. -Could consider shockwave therapy.

## 2021-07-22 NOTE — Patient Instructions (Signed)
Good to see you Please try heat  Please try compression  I have made a referral to physical therapy   Please send me a message in MyChart with any questions or updates.  Please see me back in 4-6 weeks.   --Dr. Raeford Razor

## 2021-07-22 NOTE — Therapy (Signed)
OUTPATIENT PHYSICAL THERAPY TREATMENT NOTE   Patient Name: Noah Lane MRN: [id number] DOB:[birth year], 66 y.o., male Today's Date: 07/22/2021  PCP: [name], DO REFERRING PROVIDER: Dr Clearance Coots  Progress Note Reporting Period 02/24/2022 to 07/08/2021  See note below for Objective Data and Assessment of Progress/Goals.       PT End of Session - 07/22/21 0742     Visit Number 32    Date for PT Re-Evaluation 08/14/21    Authorization Type Medicare 40th visit progress note    Progress Note Due on Visit 55    PT Start Time 0730    PT Stop Time 0810    PT Time Calculation (min) 40 min    Activity Tolerance Patient tolerated treatment well    Behavior During Therapy [facility] for tasks assessed/performed              Past Medical History:  Diagnosis Date   Diabetes mellitus without complication ([place])    Past Surgical History:  Procedure Laterality Date   HERNIA REPAIR  2012   Patient Active Problem List   Diagnosis Date Noted   Patellofemoral pain syndrome of both knees 02/24/2021   Rotator cuff tendinitis, right 12/28/2019   Hip pain, bilateral 05/28/2019   Chronic bilateral low back pain without sciatica 05/28/2019   Diabetes mellitus type 2 in obese ([place]) 05/28/2019   Erectile dysfunction 05/28/2019   Pain and swelling of left knee 03/12/2019   Lateral epicondylitis, right elbow 03/15/2017    REFERRING DIAG: Rotator cuff tendinitis, right. bil patellofemoral pain    THERAPY DIAG:  Chronic pain of left knee  Chronic pain of right knee  Muscle weakness (generalized)  Difficulty in walking, not elsewhere classified  Acute bilateral low back pain without sciatica  Pain in right hip  Pain in left hip  Chronic right shoulder pain  PERTINENT HISTORY: DM with neuropathy, chronic LBP/hip pain  PRECAUTIONS: None  SUBJECTIVE: Pt reports that he tripped on something in his living room on 07/15/21 night following his PT appointment. Pt  states that he has spent the past several days using cold packs and is still having some calf pain.    PAIN:  Are you having pain? Yes NPRS scale: 9/10 Pain location: Right calf PAIN TYPE: sharp Pain description: intermittent   OBJECTIVE:    DIAGNOSTIC FINDINGS:  Shoulder MRI on 03/15/21:  1. Moderate anterior greater than mid AP dimension of the infraspinatus tendinosis. Small partial-thickness midsubstance tears of the anterior infraspinatus tendon both proximally and distally. The distal anterior infraspinatus tendon tear minimally extends through the bursal tendon surface. 2. Mild superior subscapularis tendinosis. 3. 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 Type 3 acromion. 4. Mild subacromial/subdeltoid bursitis. 5. Mild-to-moderate glenohumeral cartilage degenerative changes with posteroinferior glenoid labrum likely degenerative tear.   PATIENT SURVEYS:  FOTO :  Shoulder 60%, Knee 65% FOTO on 06/10/21: shoulder 57%, knee 61% FOTO on 07/08/2021:  shoulder 58%, knee 56%   COGNITION:          Overall cognitive status: Within functional limits for tasks assessed   POSTURE: Forward head, rounded shoulder   UPPER EXTREMITY AROM/PROM:   A/PROM Right 04/22/2021 Left 04/22/2021 Right 05/20/2021 Right 06/17/2021  Shoulder flexion 130 150 140 145  Shoulder extension        Shoulder abduction 120 135 142 146  Shoulder adduction        Shoulder internal rotation      [facility]  Shoulder external rotation  WFL  Elbow flexion        Elbow extension        Wrist flexion        Wrist extension        Wrist ulnar deviation        Wrist radial deviation        Wrist pronation        Wrist supination        (Blank rows = not tested)   UPPER EXTREMITY MMT:   06/17/2021 R shoulder strength of grossly 4 to 4+/5 throughout.  R hip strength of 4 to 4+/5, R hamstring of 4/5, R quad of 4+/5.  04/22/2021 R shoulder strength of grossly 4 to 4+/5 throughout.  R  hip strength of 4/5, R hamstring of 4/5, R quad of 4+/5.             TODAY'S TREATMENT:   07/22/2021: Seated long sitting hamstring stretch 3x20 sec NuStep level 5 x5 min with PT present to discuss trip/fall and still having pain, recommended going to see MD Seated with 6#:  shoulder flexion, shoulder abduction, bicep curls.  2x10 each bilat. DRY NEEDLING: Dry needling consent given? yes Educational handouts provided? yes Muscles treated: Right delts, R infraspinatus, R upper trap, R rhomboids Response from dry needling: twitch response and muscle elongation.  Utilized skilled palpation to identify locations of trigger points.  07/15/2021: Seated long sitting hamstring stretch 2x20 sec Sit to/from stand holding 10# kettlebell:  x10 with chest press, x10 with overhead press Seated crunches holding 10# kettlebell 2x10 Seated backwards leaning core twist with 10# kettlebell 2x10 bilat Lat Pull 55# 2x10 Seated row 40# 2x10 DRY NEEDLING: Dry needling consent given? yes Educational handouts provided? yes Muscles treated: Right delts, R pec, R upper trap, R lats, R rhomboids Response from dry needling: twitch response and muscle elongation.  Utilized skilled palpation to identify locations of trigger points.   07/08/2021: Lat Pull 55# 2x10 Seated row 40# 2x10 Tricep Pul[place]own 30# 2x10 Sit to/from stand holding 10# kettlebell:  x10 with chest press, x10 with overhead press Leg Press Bil 115# 2x10,  seat 9 Seated backwards leaning core twist with 10# kettlebell 2x10 bilat Seated crunches holding 10# kettlebell 2x10 90 degree kitchen counter stretch 5 x 15 sec Shoulder flexion and shoulder abduction with 4# dumbbells 2x10 bilat DRY NEEDLING: Dry needling consent given? yes Educational handouts provided? yes Muscles treated: Right delts, R pec, R upper trap, R lats, R rhomboids, R thoracic multifidi Response from dry needling: twitch response and muscle elongation.  Utilized skilled  palpation to identify locations of trigger points.     PATIENT EDUCATION: Education details: Pt educated on the importance of continued HEP. Person educated: Patient Education method: Explanation Education comprehension: verbalized understanding     HOME EXERCISE PROGRAM: Access Code: [email or web address] URL: [email or web address] Date: 04/22/2021 Prepared by: Noah Lane   Exercises Hip Flexor Stretch on Step - 1 x daily - 7 x weekly - 1 sets - 10 reps Standing Quad Stretch with Table and Chair Support - 2 x daily - 7 x weekly - 1 sets - 3 reps - 30 sec hold Long Sitting Quad Set - 2 x daily - 7 x weekly - 1 sets - 20 reps - 2-3 sec hold Supine Knee Extension Strengthening - 2 x daily - 7 x weekly - 1 sets - 20 reps Small Range Straight Leg Raise - 2 x daily - 7 x weekly - 1 sets - 20  reps Supine Bridge - 1 x daily - 7 x weekly - 1 sets - 10 reps Hooklying Isometric Hip Flexion - 1 x daily - 7 x weekly - 1 sets - 10 reps - 5 hold Sidelying Hip Abduction - 1 x daily - 7 x weekly - 1 sets - 10 reps Sit to Stand Without Arm Support - 1 x daily - 7 x weekly - 1 sets - 10 reps Standing Shoulder Row with Anchored Resistance - 1-2 x daily - 7 x weekly - 2 sets - 10 reps Shoulder extension with resistance - Neutral - 1-2 x daily - 7 x weekly - 2 sets - 10 reps Wall Push Up - 1-2 x daily - 7 x weekly - 2 sets - 10 reps Sit to Stand Without Arm Support - 1 x daily - 7 x weekly - 2 sets - 10 reps     ASSESSMENT:   CLINICAL IMPRESSION: Noah Lane presents to PT reporting compliance with HEP but stating increased pain from tripping and falling in his home. Pt with increased right calf pain with pt reporting 9/10 still a week later.  Recommended pt follow up with Dr Raeford Razor for assessment and pt agreeable.  Able to reach out to Dr Raeford Razor and he was agreeable to see patient today at 1:50 pm and pt stated that he could make that work.  Pt limited during session today secondary to right  calf pain.  Pt continues to report decreased pain and tightness in his right shoulder following dry needling.  Pt continues to require skilled rehabilitation to progress towards goal related activities.   OBJECTIVE IMPAIRMENTS decreased activity tolerance, decreased balance, difficulty walking, decreased ROM, decreased strength, increased muscle spasms, impaired flexibility, impaired UE functional use, improper body mechanics, postural dysfunction, and pain.    ACTIVITY LIMITATIONS community activity and yard work.    PERSONAL FACTORS 1-2 comorbidities: DM with neuropathy; chronic LBP/hip pain  are also affecting patient's functional outcome.      REHAB POTENTIAL: Good   CLINICAL DECISION MAKING: Evolving/moderate complexity   EVALUATION COMPLEXITY: Moderate     GOALS: Goals reviewed with patient? Yes   SHORT TERM GOALS:   STG Name Target Date Goal status  1 Pt will be I and compliant with initial HEP.  Baseline:  05/06/2021 MET  2 Patient will have increased HS length to 65 degrees bil and hip extension to 10 degrees bil  Baseline:  05/06/2021 MET  3 The patient will rise sit to stand 5x in 16 sec or less  Baseline: 05/06/2021 MET  4 Pt will report ability to walk 1/2 mile with pain/discomfort 3/10 or less  Baseline: 05/06/2021 MET    LONG TERM GOALS:    LTG Name Target Date Goal status  1 The patient will be independent in a safe self progression of HEP  Baseline: 08/14/2021 IN PROGRESS  2 Pt will report ability to walk dogs for at least 25 minutes at a time or 1 mile with pain 3/10 or less  Baseline: 08/14/2021 IN PROGRESS  3 Bil knee and shoulder strength improved to 4+/5 and hip strength to 4/5 needed for standing/walking longer periods of time at work and for hiking and lifting grandkids  Baseline: 08/14/2021 IN PROGRESS  4 FOTO score of knees to increase to 65% and shoulder to at least 64% indicating improved function with less pain.  Baseline: 08/14/2021 IN PROGRESS  5 R  shoulder flexion and abduction to increase to at least 130 degrees  to allow pt to reach into overhead cabinets. Baseline: 06/17/2021 Goal Met 05/20/21    PLAN: PT FREQUENCY: 1-2x/week   PT DURATION: 8 weeks   PLANNED INTERVENTIONS: Therapeutic exercises, Therapeutic activity, Neuro Muscular re-education, Balance training, Gait training, Patient/Family education, Joint mobilization, Stair training, Aquatic Therapy, Dry Needling, Electrical stimulation, Cryotherapy, Moist heat, Taping, Vasopneumatic device, Ultrasound, Ionotophoresis 1m/ml Dexamethasone, and Manual therapy   PLAN FOR NEXT SESSION: KX Modifier Needed. Assess dry needling benefits, strengthening, core stability, shoulder stability, possible dry needling if indicated.  Assess how pt's follow up with Dr SRaeford Razorfor right calf went.   SJuel Burrow PT, DPT 07/22/21 8:32 AM   [facility] [address] [place] [address]Phone # [phone] [phone]

## 2021-07-29 ENCOUNTER — Ambulatory Visit: Payer: Medicare Other | Admitting: Rehabilitative and Restorative Service Providers"

## 2021-07-29 ENCOUNTER — Encounter: Payer: Self-pay | Admitting: Rehabilitative and Restorative Service Providers"

## 2021-07-29 DIAGNOSIS — M25551 Pain in right hip: Secondary | ICD-10-CM

## 2021-07-29 DIAGNOSIS — M25552 Pain in left hip: Secondary | ICD-10-CM

## 2021-07-29 DIAGNOSIS — M545 Low back pain, unspecified: Secondary | ICD-10-CM

## 2021-07-29 DIAGNOSIS — G8929 Other chronic pain: Secondary | ICD-10-CM

## 2021-07-29 DIAGNOSIS — R262 Difficulty in walking, not elsewhere classified: Secondary | ICD-10-CM

## 2021-07-29 DIAGNOSIS — M25562 Pain in left knee: Secondary | ICD-10-CM | POA: Diagnosis not present

## 2021-07-29 DIAGNOSIS — M6281 Muscle weakness (generalized): Secondary | ICD-10-CM

## 2021-07-29 NOTE — Therapy (Signed)
OUTPATIENT PHYSICAL THERAPY TREATMENT NOTE   Patient Name: Noah Lane MRN: [id number] DOB:1955/03/23, 66 y.o., male Today's Date: 07/29/2021  PCP: [name], DO REFERRING PROVIDER: Dr Clearance Coots     PT End of Session - 07/29/21 0734     Visit Number 33    Date for PT Re-Evaluation 08/14/21    Authorization Type Medicare 40th visit progress note    Progress Note Due on Visit 47    PT Start Time 0730    PT Stop Time 0810    PT Time Calculation (min) 40 min    Activity Tolerance Patient tolerated treatment well    Behavior During Therapy [facility] for tasks assessed/performed              Past Medical History:  Diagnosis Date   Diabetes mellitus without complication ([place])    Past Surgical History:  Procedure Laterality Date   HERNIA REPAIR  2012   Patient Active Problem List   Diagnosis Date Noted   Strain of calf muscle 07/22/2021   Patellofemoral pain syndrome of both knees 02/24/2021   Rotator cuff tendinitis, right 12/28/2019   Hip pain, bilateral 05/28/2019   Chronic bilateral low back pain without sciatica 05/28/2019   Diabetes mellitus type 2 in obese ([place]) 05/28/2019   Erectile dysfunction 05/28/2019   Pain and swelling of left knee 03/12/2019   Lateral epicondylitis, right elbow 03/15/2017    REFERRING DIAG: Rotator cuff tendinitis, right. bil patellofemoral pain    THERAPY DIAG:  Chronic pain of left knee  Chronic pain of right knee  Muscle weakness (generalized)  Difficulty in walking, not elsewhere classified  Acute bilateral low back pain without sciatica  Pain in right hip  Pain in left hip  Chronic right shoulder pain  PERTINENT HISTORY: DM with neuropathy, chronic LBP/hip pain  PRECAUTIONS: None  SUBJECTIVE: Pt reports that his calf is hurting, but has   PAIN:  Are you having pain? Yes NPRS scale: 7/10 Pain location: Right calf PAIN TYPE: sharp Pain description: intermittent   OBJECTIVE:    DIAGNOSTIC  FINDINGS:  Shoulder MRI on 03/15/21:  1. Moderate anterior greater than mid AP dimension of the infraspinatus tendinosis. Small partial-thickness midsubstance tears of the anterior infraspinatus tendon both proximally and distally. The distal anterior infraspinatus tendon tear minimally extends through the bursal tendon surface. 2. Mild superior subscapularis tendinosis. 3. 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 Type 3 acromion. 4. Mild subacromial/subdeltoid bursitis. 5. Mild-to-moderate glenohumeral cartilage degenerative changes with posteroinferior glenoid labrum likely degenerative tear.   PATIENT SURVEYS:  FOTO :  Shoulder 60%, Knee 65% FOTO on 06/10/21: shoulder 57%, knee 61% FOTO on 07/08/2021:  shoulder 58%, knee 56%   COGNITION:          Overall cognitive status: Within functional limits for tasks assessed   POSTURE: Forward head, rounded shoulder   UPPER EXTREMITY AROM/PROM:   A/PROM Right 04/22/2021 Left 04/22/2021 Right 05/20/2021 Right 06/17/2021  Shoulder flexion 130 150 140 145  Shoulder extension        Shoulder abduction 120 135 142 146  Shoulder adduction        Shoulder internal rotation      WFL  Shoulder external rotation      WFL  Elbow flexion        Elbow extension        Wrist flexion        Wrist extension        Wrist ulnar deviation  Wrist radial deviation        Wrist pronation        Wrist supination        (Blank rows = not tested)  07/29/2021:  Added ankle ROM R dorsiflexion of 18 degrees, R plantarflexion of 42 degrees L dorsiflexion of 0 degrees, L plantarflexion of 60 degrees   UPPER EXTREMITY MMT:   06/17/2021 R shoulder strength of grossly 4 to 4+/5 throughout.  R hip strength of 4 to 4+/5, R hamstring of 4/5, R quad of 4+/5.  04/22/2021 R shoulder strength of grossly 4 to 4+/5 throughout.  R hip strength of 4/5, R hamstring of 4/5, R quad of 4+/5.             TODAY'S TREATMENT:    07/29/2021: NuStep level 5 x5 min with PT present to discuss status and calf strain Assessment of R calf strain:  motion of R ankle is WFL, but with pain noted.  R ankle strength is WFL Seated with 6#:  shoulder flexion, shoulder abduction, bicep curls, overhead shoulder press.  2x10 each bilat Trigger Point Dry-Needling  Treatment instructions: Expect mild to moderate muscle soreness. S/S of pneumothorax if dry needled over a lung field, and to seek immediate medical attention should they occur. Patient verbalized understanding of these instructions and education. Patient Consent Given: Yes Education handout provided: Previously provided Muscles treated: R upper trap and R delt Electrical stimulation performed: Yes Parameters:  milli amps on first dot x10 minutes for pain management Treatment response/outcome: Pt reports decreased pain and less stiffness following   07/22/2021: Seated long sitting hamstring stretch 3x20 sec NuStep level 5 x5 min with PT present to discuss trip/fall and still having pain, recommended going to see MD Seated with 6#:  shoulder flexion, shoulder abduction, bicep curls.  2x10 each bilat. DRY NEEDLING: Dry needling consent given? yes Educational handouts provided? yes Muscles treated: Right delts, R infraspinatus, R upper trap, R rhomboids Response from dry needling: twitch response and muscle elongation.  Utilized skilled palpation to identify locations of trigger points.  07/15/2021: Seated long sitting hamstring stretch 2x20 sec Sit to/from stand holding 10# kettlebell:  x10 with chest press, x10 with overhead press Seated crunches holding 10# kettlebell 2x10 Seated backwards leaning core twist with 10# kettlebell 2x10 bilat Lat Pull 55# 2x10 Seated row 40# 2x10 DRY NEEDLING: Dry needling consent given? yes Educational handouts provided? yes Muscles treated: Right delts, R pec, R upper trap, R lats, R rhomboids Response from dry needling: twitch  response and muscle elongation.  Utilized skilled palpation to identify locations of trigger points.     PATIENT EDUCATION: Education details: Pt educated on the importance of continued HEP. Person educated: Patient Education method: Explanation Education comprehension: verbalized understanding     HOME EXERCISE PROGRAM: Access Code: [email or web address] URL: [email or web address] Date: 04/22/2021 Prepared by: Shelby Dubin Jannine Abreu   Exercises Hip Flexor Stretch on Step - 1 x daily - 7 x weekly - 1 sets - 10 reps Standing Quad Stretch with Table and Chair Support - 2 x daily - 7 x weekly - 1 sets - 3 reps - 30 sec hold Long Sitting Quad Set - 2 x daily - 7 x weekly - 1 sets - 20 reps - 2-3 sec hold Supine Knee Extension Strengthening - 2 x daily - 7 x weekly - 1 sets - 20 reps Small Range Straight Leg Raise - 2 x daily - 7 x weekly - 1 sets - 20 reps Supine Bridge -  1 x daily - 7 x weekly - 1 sets - 10 reps Hooklying Isometric Hip Flexion - 1 x daily - 7 x weekly - 1 sets - 10 reps - 5 hold Sidelying Hip Abduction - 1 x daily - 7 x weekly - 1 sets - 10 reps Sit to Stand Without Arm Support - 1 x daily - 7 x weekly - 1 sets - 10 reps Standing Shoulder Row with Anchored Resistance - 1-2 x daily - 7 x weekly - 2 sets - 10 reps Shoulder extension with resistance - Neutral - 1-2 x daily - 7 x weekly - 2 sets - 10 reps Wall Push Up - 1-2 x daily - 7 x weekly - 2 sets - 10 reps Sit to Stand Without Arm Support - 1 x daily - 7 x weekly - 2 sets - 10 reps     ASSESSMENT:   CLINICAL IMPRESSION: Noah Lane presents to PT reporting compliance with HEP but stating increased pain from tripping and falling in his home. Pt presents with new order from Dr Raeford Razor to add R calf into PT plan of care.  Pt with ankle strength and A/ROM that is WFL at this time and his pain has decreased since last week.  Pt educated in standing runner's stretch for HEP for a gentle stretch for his right calf.  Added e-stim  with dry needling today to right shoulder to assess if this will help provide a longer level of pain relief.  Will continue with previous goals, as they still address calf pain and weakness and difficulty with ambulation and walking his dogs.   OBJECTIVE IMPAIRMENTS decreased activity tolerance, decreased balance, difficulty walking, decreased ROM, decreased strength, increased muscle spasms, impaired flexibility, impaired UE functional use, improper body mechanics, postural dysfunction, and pain.    ACTIVITY LIMITATIONS community activity and yard work.    PERSONAL FACTORS 1-2 comorbidities: DM with neuropathy; chronic LBP/hip pain  are also affecting patient's functional outcome.      REHAB POTENTIAL: Good   CLINICAL DECISION MAKING: Evolving/moderate complexity   EVALUATION COMPLEXITY: Moderate     GOALS: Goals reviewed with patient? Yes   SHORT TERM GOALS:   STG Name Target Date Goal status  1 Pt will be I and compliant with initial HEP.  Baseline:  05/06/2021 MET  2 Patient will have increased HS length to 65 degrees bil and hip extension to 10 degrees bil  Baseline:  05/06/2021 MET  3 The patient will rise sit to stand 5x in 16 sec or less  Baseline: 05/06/2021 MET  4 Pt will report ability to walk 1/2 mile with pain/discomfort 3/10 or less  Baseline: 05/06/2021 MET    LONG TERM GOALS:    LTG Name Target Date Goal status  1 The patient will be independent in a safe self progression of HEP  Baseline: 08/14/2021 IN PROGRESS  2 Pt will report ability to walk dogs for at least 25 minutes at a time or 1 mile with pain 3/10 or less  Baseline: 08/14/2021 IN PROGRESS  3 Bil knee and shoulder strength improved to 4+/5 and hip strength to 4/5 needed for standing/walking longer periods of time at work and for hiking and lifting grandkids  Baseline: 08/14/2021 IN PROGRESS  4 FOTO score of knees to increase to 65% and shoulder to at least 64% indicating improved function with less pain.   Baseline: 08/14/2021 IN PROGRESS  5 R shoulder flexion and abduction to increase to at least 130  degrees to allow pt to reach into overhead cabinets. Baseline: 06/17/2021 Goal Met 05/20/21    PLAN: PT FREQUENCY: 1-2x/week   PT DURATION: 8 weeks   PLANNED INTERVENTIONS: Therapeutic exercises, Therapeutic activity, Neuro Muscular re-education, Balance training, Gait training, Patient/Family education, Joint mobilization, Stair training, Aquatic Therapy, Dry Needling, Electrical stimulation, Cryotherapy, Moist heat, Taping, Vasopneumatic device, Ultrasound, Ionotophoresis 25m/ml Dexamethasone, and Manual therapy   PLAN FOR NEXT SESSION: KX Modifier Needed. Assess dry needling benefits, strengthening, core stability, shoulder stability, possible dry needling if indicated, progress calf strengthening.   SJuel Burrow PT, DPT 07/29/21 9:07 AM   [facility] [address] [place]reat [place] [address]Phone # [phone] [phone]

## 2021-08-05 ENCOUNTER — Encounter: Payer: Self-pay | Admitting: Rehabilitative and Restorative Service Providers"

## 2021-08-05 ENCOUNTER — Ambulatory Visit: Payer: Medicare Other | Attending: Family Medicine | Admitting: Rehabilitative and Restorative Service Providers"

## 2021-08-05 DIAGNOSIS — M25511 Pain in right shoulder: Secondary | ICD-10-CM | POA: Diagnosis present

## 2021-08-05 DIAGNOSIS — M25552 Pain in left hip: Secondary | ICD-10-CM

## 2021-08-05 DIAGNOSIS — M25562 Pain in left knee: Secondary | ICD-10-CM | POA: Insufficient documentation

## 2021-08-05 DIAGNOSIS — S86811A Strain of other muscle(s) and tendon(s) at lower leg level, right leg, initial encounter: Secondary | ICD-10-CM | POA: Insufficient documentation

## 2021-08-05 DIAGNOSIS — M6281 Muscle weakness (generalized): Secondary | ICD-10-CM

## 2021-08-05 DIAGNOSIS — G8929 Other chronic pain: Secondary | ICD-10-CM

## 2021-08-05 DIAGNOSIS — M545 Low back pain, unspecified: Secondary | ICD-10-CM

## 2021-08-05 DIAGNOSIS — R262 Difficulty in walking, not elsewhere classified: Secondary | ICD-10-CM

## 2021-08-05 DIAGNOSIS — M25561 Pain in right knee: Secondary | ICD-10-CM | POA: Diagnosis present

## 2021-08-05 DIAGNOSIS — M25551 Pain in right hip: Secondary | ICD-10-CM

## 2021-08-05 NOTE — Therapy (Signed)
OUTPATIENT PHYSICAL THERAPY TREATMENT NOTE   Patient Name: Marquinn Meschke MRN: [id number] DOB:1955/10/05, 66 y.o., male Today's Date: 08/05/2021  PCP: [name], DO REFERRING PROVIDER: Dr Clearance Coots     PT End of Session - 08/05/21 0732     Visit Number 34    Date for PT Re-Evaluation 08/14/21    Authorization Type Medicare 40th visit progress note    PT Start Time 0730    PT Stop Time 0810    PT Time Calculation (min) 40 min    Activity Tolerance Patient tolerated treatment well    Behavior During Therapy [facility] for tasks assessed/performed              Past Medical History:  Diagnosis Date   Diabetes mellitus without complication ([place])    Past Surgical History:  Procedure Laterality Date   HERNIA REPAIR  2012   Patient Active Problem List   Diagnosis Date Noted   Strain of calf muscle 07/22/2021   Patellofemoral pain syndrome of both knees 02/24/2021   Rotator cuff tendinitis, right 12/28/2019   Hip pain, bilateral 05/28/2019   Chronic bilateral low back pain without sciatica 05/28/2019   Diabetes mellitus type 2 in obese ([place]) 05/28/2019   Erectile dysfunction 05/28/2019   Pain and swelling of left knee 03/12/2019   Lateral epicondylitis, right elbow 03/15/2017    REFERRING DIAG: Rotator cuff tendinitis, right. bil patellofemoral pain    THERAPY DIAG:  Chronic pain of left knee  Chronic pain of right knee  Muscle weakness (generalized)  Difficulty in walking, not elsewhere classified  Acute bilateral low back pain without sciatica  Pain in right hip  Pain in left hip  Chronic right shoulder pain  PERTINENT HISTORY: DM with neuropathy, chronic LBP/hip pain  PRECAUTIONS: None  SUBJECTIVE: Pt reports that his calf is feeling better.  Pt states that his shoulder felt better after e-stim with dry needling with his shoulder and he has been able to reach over to turn off his CPAP without increased pain as he had before.  PAIN:   Are you having pain? Yes NPRS scale: 3-4/10 Pain location: Right calf PAIN TYPE: sharp Pain description: intermittent   OBJECTIVE:    DIAGNOSTIC FINDINGS:  Shoulder MRI on 03/15/21:  1. Moderate anterior greater than mid AP dimension of the infraspinatus tendinosis. Small partial-thickness midsubstance tears of the anterior infraspinatus tendon both proximally and distally. The distal anterior infraspinatus tendon tear minimally extends through the bursal tendon surface. 2. Mild superior subscapularis tendinosis. 3. 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 Type 3 acromion. 4. Mild subacromial/subdeltoid bursitis. 5. Mild-to-moderate glenohumeral cartilage degenerative changes with posteroinferior glenoid labrum likely degenerative tear.   PATIENT SURVEYS:  FOTO :  Shoulder 60%, Knee 65% FOTO on 06/10/21: shoulder 57%, knee 61% FOTO on 07/08/2021:  shoulder 58%, knee 56%   COGNITION:          Overall cognitive status: Within functional limits for tasks assessed   POSTURE: Forward head, rounded shoulder   UPPER EXTREMITY AROM/PROM:   A/PROM Right 04/22/2021 Left 04/22/2021 Right 05/20/2021 Right 06/17/2021  Shoulder flexion 130 150 140 145  Shoulder extension        Shoulder abduction 120 135 142 146  Shoulder adduction        Shoulder internal rotation      WFL  Shoulder external rotation      WFL  Elbow flexion        Elbow extension  Wrist flexion        Wrist extension        Wrist ulnar deviation        Wrist radial deviation        Wrist pronation        Wrist supination        (Blank rows = not tested)  07/29/2021:  Added ankle ROM R dorsiflexion of 18 degrees, R plantarflexion of 42 degrees L dorsiflexion of 0 degrees, L plantarflexion of 60 degrees   UPPER EXTREMITY MMT:   06/17/2021 R shoulder strength of grossly 4 to 4+/5 throughout.  R hip strength of 4 to 4+/5, R hamstring of 4/5, R quad of 4+/5.  04/22/2021 R shoulder  strength of grossly 4 to 4+/5 throughout.  R hip strength of 4/5, R hamstring of 4/5, R quad of 4+/5.             TODAY'S TREATMENT:   08/05/2021: NuStep level 5 x5 min with PT present to discuss status and calf strain Gastroc stretch on slanted rocker board 2x30 sec Heel raise on 2" step 2x10 bilat Lat Pull 55# 2x10 Leg press 100# 2x10 Seated long sitting hamstring stretch 2x20 sec Trigger Point Dry-Needling  Treatment instructions: Expect mild to moderate muscle soreness. S/S of pneumothorax if dry needled over a lung field, and to seek immediate medical attention should they occur. Patient verbalized understanding of these instructions and education. Patient Consent Given: Yes Education handout provided: Previously provided Muscles treated: R upper trap and R delt Electrical stimulation performed: Yes Parameters: milli amps on first dot x10 minutes for pain management Treatment response/outcome: Pt reports decreased pain and less stiffness following   07/29/2021: NuStep level 5 x5 min with PT present to discuss status and calf strain Assessment of R calf strain:  motion of R ankle is WFL, but with pain noted.  R ankle strength is WFL Seated with 6#:  shoulder flexion, shoulder abduction, bicep curls, overhead shoulder press.  2x10 each bilat Trigger Point Dry-Needling  Treatment instructions: Expect mild to moderate muscle soreness. S/S of pneumothorax if dry needled over a lung field, and to seek immediate medical attention should they occur. Patient verbalized understanding of these instructions and education. Patient Consent Given: Yes Education handout provided: Previously provided Muscles treated: R upper trap and R delt Electrical stimulation performed: Yes Parameters:  milli amps on first dot x10 minutes for pain management Treatment response/outcome: Pt reports decreased pain and less stiffness following   07/22/2021: Seated long sitting hamstring stretch 3x20 sec NuStep  level 5 x5 min with PT present to discuss trip/fall and still having pain, recommended going to see MD Seated with 6#:  shoulder flexion, shoulder abduction, bicep curls.  2x10 each bilat. DRY NEEDLING: Dry needling consent given? yes Educational handouts provided? yes Muscles treated: Right delts, R infraspinatus, R upper trap, R rhomboids Response from dry needling: twitch response and muscle elongation.  Utilized skilled palpation to identify locations of trigger points.    PATIENT EDUCATION: Education details: Pt educated on the importance of continued HEP. Person educated: Patient Education method: Explanation Education comprehension: verbalized understanding     HOME EXERCISE PROGRAM: Access Code: [email or web address] URL: [email or web address] Date: 04/22/2021 Prepared by: Shelby Dubin Claudett Bayly   Exercises Hip Flexor Stretch on Step - 1 x daily - 7 x weekly - 1 sets - 10 reps Standing Quad Stretch with Table and Chair Support - 2 x daily - 7 x weekly - 1 sets - 3 reps -  30 sec hold Long Sitting Quad Set - 2 x daily - 7 x weekly - 1 sets - 20 reps - 2-3 sec hold Supine Knee Extension Strengthening - 2 x daily - 7 x weekly - 1 sets - 20 reps Small Range Straight Leg Raise - 2 x daily - 7 x weekly - 1 sets - 20 reps Supine Bridge - 1 x daily - 7 x weekly - 1 sets - 10 reps Hooklying Isometric Hip Flexion - 1 x daily - 7 x weekly - 1 sets - 10 reps - 5 hold Sidelying Hip Abduction - 1 x daily - 7 x weekly - 1 sets - 10 reps Sit to Stand Without Arm Support - 1 x daily - 7 x weekly - 1 sets - 10 reps Standing Shoulder Row with Anchored Resistance - 1-2 x daily - 7 x weekly - 2 sets - 10 reps Shoulder extension with resistance - Neutral - 1-2 x daily - 7 x weekly - 2 sets - 10 reps Wall Push Up - 1-2 x daily - 7 x weekly - 2 sets - 10 reps Sit to Stand Without Arm Support - 1 x daily - 7 x weekly - 2 sets - 10 reps     ASSESSMENT:   CLINICAL IMPRESSION: Mr Naumann presents to PT  reporting compliance with HEP and stating that his calf is feeling a lot better from initial injury.  Pt with reported decreased pain following dry needling with e-stim and able to reach for his CPAP at night without the same pain that he would feel before. Pt able to reintegrate strengthening and stretching exercises as he was doing prior to calf injury and did not have increased pain on leg press.  Pt to be reassessed next week to determine ongoing plan of care.   OBJECTIVE IMPAIRMENTS decreased activity tolerance, decreased balance, difficulty walking, decreased ROM, decreased strength, increased muscle spasms, impaired flexibility, impaired UE functional use, improper body mechanics, postural dysfunction, and pain.    ACTIVITY LIMITATIONS community activity and yard work.    PERSONAL FACTORS 1-2 comorbidities: DM with neuropathy; chronic LBP/hip pain  are also affecting patient's functional outcome.      REHAB POTENTIAL: Good   CLINICAL DECISION MAKING: Evolving/moderate complexity   EVALUATION COMPLEXITY: Moderate     GOALS: Goals reviewed with patient? Yes   SHORT TERM GOALS:   STG Name Target Date Goal status  1 Pt will be I and compliant with initial HEP.  Baseline:  05/06/2021 MET  2 Patient will have increased HS length to 65 degrees bil and hip extension to 10 degrees bil  Baseline:  05/06/2021 MET  3 The patient will rise sit to stand 5x in 16 sec or less  Baseline: 05/06/2021 MET  4 Pt will report ability to walk 1/2 mile with pain/discomfort 3/10 or less  Baseline: 05/06/2021 MET    LONG TERM GOALS:    LTG Name Target Date Goal status  1 The patient will be independent in a safe self progression of HEP  Baseline: 08/14/2021 IN PROGRESS  2 Pt will report ability to walk dogs for at least 25 minutes at a time or 1 mile with pain 3/10 or less  Baseline: 08/14/2021 IN PROGRESS  3 Bil knee and shoulder strength improved to 4+/5 and hip strength to 4/5 needed for standing/walking  longer periods of time at work and for hiking and lifting grandkids  Baseline: 08/14/2021 IN PROGRESS  4 FOTO score of knees  to increase to 65% and shoulder to at least 64% indicating improved function with less pain.  Baseline: 08/14/2021 IN PROGRESS  5 R shoulder flexion and abduction to increase to at least 130 degrees to allow pt to reach into overhead cabinets. Baseline: 06/17/2021 Goal Met 05/20/21    PLAN: PT FREQUENCY: 1-2x/week   PT DURATION: 8 weeks   PLANNED INTERVENTIONS: Therapeutic exercises, Therapeutic activity, Neuro Muscular re-education, Balance training, Gait training, Patient/Family education, Joint mobilization, Stair training, Aquatic Therapy, Dry Needling, Electrical stimulation, Cryotherapy, Moist heat, Taping, Vasopneumatic device, Ultrasound, Ionotophoresis 10m/ml Dexamethasone, and Manual therapy   PLAN FOR NEXT SESSION: KX Modifier Needed. Reassessment, Assess dry needling benefits, strengthening, core stability, shoulder stability, possible dry needling if indicated, progress calf strengthening.   SJuel Burrow PT, DPT 08/05/21 9:43 AM   [facility] [address] [place] [address]Phone # [phone] [phone]

## 2021-08-12 ENCOUNTER — Ambulatory Visit: Payer: Medicare Other

## 2021-08-12 DIAGNOSIS — G8929 Other chronic pain: Secondary | ICD-10-CM

## 2021-08-12 DIAGNOSIS — M25562 Pain in left knee: Secondary | ICD-10-CM

## 2021-08-12 DIAGNOSIS — M545 Low back pain, unspecified: Secondary | ICD-10-CM

## 2021-08-12 DIAGNOSIS — R262 Difficulty in walking, not elsewhere classified: Secondary | ICD-10-CM

## 2021-08-12 DIAGNOSIS — M25552 Pain in left hip: Secondary | ICD-10-CM

## 2021-08-12 DIAGNOSIS — M25551 Pain in right hip: Secondary | ICD-10-CM

## 2021-08-12 DIAGNOSIS — M6281 Muscle weakness (generalized): Secondary | ICD-10-CM

## 2021-08-12 NOTE — Therapy (Signed)
OUTPATIENT PHYSICAL THERAPY TREATMENT NOTE   Patient Name: Noah Lane MRN: [id number] DOB:June 26, 1955, 66 y.o., male Today's Date: 08/12/2021  PCP: [name], DO REFERRING PROVIDER: Dr Clearance Coots        Past Medical History:  Diagnosis Date   Diabetes mellitus without complication [facility])    Past Surgical History:  Procedure Laterality Date   HERNIA REPAIR  2012   Patient Active Problem List   Diagnosis Date Noted   Strain of calf muscle 07/22/2021   Patellofemoral pain syndrome of both knees 02/24/2021   Rotator cuff tendinitis, right 12/28/2019   Hip pain, bilateral 05/28/2019   Chronic bilateral low back pain without sciatica 05/28/2019   Diabetes mellitus type 2 in obese ([place]) 05/28/2019   Erectile dysfunction 05/28/2019   Pain and swelling of left knee 03/12/2019   Lateral epicondylitis, right elbow 03/15/2017    REFERRING DIAG: Rotator cuff tendinitis, right. bil patellofemoral pain    THERAPY DIAG:  Chronic pain of left knee  Chronic pain of right knee  Muscle weakness (generalized)  Difficulty in walking, not elsewhere classified  Acute bilateral low back pain without sciatica  Pain in right hip  Pain in left hip  Chronic right shoulder pain  PERTINENT HISTORY: DM with neuropathy, chronic LBP/hip pain  PRECAUTIONS: None  SUBJECTIVE: The shoulder is feeling the same.  My calf is 20% better.   50% improvement in knee pain  PAIN:  Are you having pain? Yes NPRS scale: 7/10 Pain location: Rt Shoulder and calf PAIN TYPE: sharp Pain description: intermittent   OBJECTIVE:    DIAGNOSTIC FINDINGS:  Shoulder MRI on 03/15/21:  1. Moderate anterior greater than mid AP dimension of the infraspinatus tendinosis. Small partial-thickness midsubstance tears of the anterior infraspinatus tendon both proximally and distally. The distal anterior infraspinatus tendon tear minimally extends through the bursal tendon surface. 2. Mild  superior subscapularis tendinosis. 3. 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 Type 3 acromion. 4. Mild subacromial/subdeltoid bursitis. 5. Mild-to-moderate glenohumeral cartilage degenerative changes with posteroinferior glenoid labrum likely degenerative tear.   PATIENT SURVEYS:  FOTO :  Shoulder 60%, Knee 65% FOTO on 06/10/21: shoulder 57%, knee 61% FOTO on 07/08/2021:  shoulder 58%, knee 56% FOTO on 08/12/21:  Shoulder 60, knee 54   COGNITION:          Overall cognitive status: Within functional limits for tasks assessed   POSTURE: Forward head, rounded shoulder   UPPER EXTREMITY AROM/PROM:   A/PROM Right 04/22/2021 Left 04/22/2021 Right 05/20/2021 Right 06/17/2021  Shoulder flexion 130 150 140 145  Shoulder extension        Shoulder abduction 120 135 142 146  Shoulder adduction        Shoulder internal rotation      WFL  Shoulder external rotation      WFL  Elbow flexion        Elbow extension        Wrist flexion        Wrist extension        Wrist ulnar deviation        Wrist radial deviation        Wrist pronation        Wrist supination        (Blank rows = not tested)  07/29/2021:  Added ankle ROM R dorsiflexion of 18 degrees, R plantarflexion of 42 degrees L dorsiflexion of 0 degrees, L plantarflexion of 60 degrees   UPPER EXTREMITY MMT:  08/12/21 R shoulder strength  flexion 4+/5, abduction 4/5, ER 4+/5, IR 5/5.  R hip strength of 4 to 4+/5, R hamstring of 4/5, R quad of 4+/5. 06/17/2021 R shoulder strength of grossly 4 to 4+/5 throughout.  R hip strength of 4 to 4+/5, R hamstring of 4/5, R quad of 4+/5.  04/22/2021 R shoulder strength of grossly 4 to 4+/5 throughout.  R hip strength of 4/5, R hamstring of 4/5, R quad of 4+/5.             TODAY'S TREATMENT:  08/12/2021: Review of all goals and visual review of all HEP Gastroc stretch on slanted rocker board 2x30 sec Lat Pull 55# 2x10 Seated long sitting hamstring stretch 2x20  sec  Trigger Point Dry-Needling  Treatment instructions: Expect mild to moderate muscle soreness. S/S of pneumothorax if dry needled over a lung field, and to seek immediate medical attention should they occur. Patient verbalized understanding of these instructions and education. Patient Consent Given: Yes Education handout provided: Previously provided Muscles treated: R upper trap and R delt Electrical stimulation performed: Yes Parameters: milli amps on first dot x10 minutes for pain management Treatment response/outcome: Pt reports decreased pain and less stiffness following  08/05/2021: NuStep level 5 x5 min with PT present to discuss status and calf strain Gastroc stretch on slanted rocker board 2x30 sec Heel raise on 2" step 2x10 bilat Lat Pull 55# 2x10 Leg press 100# 2x10 Seated long sitting hamstring stretch 2x20 sec Trigger Point Dry-Needling  Treatment instructions: Expect mild to moderate muscle soreness. S/S of pneumothorax if dry needled over a lung field, and to seek immediate medical attention should they occur. Patient verbalized understanding of these instructions and education. Patient Consent Given: Yes Education handout provided: Previously provided Muscles treated: R upper trap and R delt Electrical stimulation performed: Yes Parameters: milli amps on first dot x10 minutes for pain management Treatment response/outcome: Pt reports decreased pain and less stiffness following   07/29/2021: NuStep level 5 x5 min with PT present to discuss status and calf strain Assessment of R calf strain:  motion of R ankle is WFL, but with pain noted.  R ankle strength is WFL Seated with 6#:  shoulder flexion, shoulder abduction, bicep curls, overhead shoulder press.  2x10 each bilat Trigger Point Dry-Needling  Treatment instructions: Expect mild to moderate muscle soreness. S/S of pneumothorax if dry needled over a lung field, and to seek immediate medical attention should they  occur. Patient verbalized understanding of these instructions and education. Patient Consent Given: Yes Education handout provided: Previously provided Muscles treated: R upper trap and R delt Electrical stimulation performed: Yes Parameters:  milli amps on first dot x10 minutes for pain management Treatment response/outcome: Pt reports decreased pain and less stiffness following    PATIENT EDUCATION: Education details: Pt educated on the importance of continued HEP. Person educated: Patient Education method: Explanation Education comprehension: verbalized understanding     HOME EXERCISE PROGRAM: Access Code: [email or web address] URL: [email or web address] Date: 04/22/2021 Prepared by: Shelby Dubin Menke   Exercises Hip Flexor Stretch on Step - 1 x daily - 7 x weekly - 1 sets - 10 reps Standing Quad Stretch with Table and Chair Support - 2 x daily - 7 x weekly - 1 sets - 3 reps - 30 sec hold Long Sitting Quad Set - 2 x daily - 7 x weekly - 1 sets - 20 reps - 2-3 sec hold Supine Knee Extension Strengthening - 2 x daily - 7 x weekly - 1 sets - 20 reps  Small Range Straight Leg Raise - 2 x daily - 7 x weekly - 1 sets - 20 reps Supine Bridge - 1 x daily - 7 x weekly - 1 sets - 10 reps Hooklying Isometric Hip Flexion - 1 x daily - 7 x weekly - 1 sets - 10 reps - 5 hold Sidelying Hip Abduction - 1 x daily - 7 x weekly - 1 sets - 10 reps Sit to Stand Without Arm Support - 1 x daily - 7 x weekly - 1 sets - 10 reps Standing Shoulder Row with Anchored Resistance - 1-2 x daily - 7 x weekly - 2 sets - 10 reps Shoulder extension with resistance - Neutral - 1-2 x daily - 7 x weekly - 2 sets - 10 reps Wall Push Up - 1-2 x daily - 7 x weekly - 2 sets - 10 reps Sit to Stand Without Arm Support - 1 x daily - 7 x weekly - 2 sets - 10 reps     ASSESSMENT:   CLINICAL IMPRESSION: Pt is ready to D/C to HEP.  Session spent reviewing HEP and discussing how to progress.  Pt with continued chronic pain and  will continue with his strength flexibility routine to improve function.  Pt with good response to DN today with twitch and improved mobility after treatment.  Pt will D/C today and follow-up with MD as needed.     OBJECTIVE IMPAIRMENTS decreased activity tolerance, decreased balance, difficulty walking, decreased ROM, decreased strength, increased muscle spasms, impaired flexibility, impaired UE functional use, improper body mechanics, postural dysfunction, and pain.    ACTIVITY LIMITATIONS community activity and yard work.    PERSONAL FACTORS 1-2 comorbidities: DM with neuropathy; chronic LBP/hip pain  are also affecting patient's functional outcome.      REHAB POTENTIAL: Good   CLINICAL DECISION MAKING: Evolving/moderate complexity   EVALUATION COMPLEXITY: Moderate     GOALS: Goals reviewed with patient? Yes   SHORT TERM GOALS:   STG Name Target Date Goal status  1 Pt will be I and compliant with initial HEP.  Baseline:  05/06/2021 MET  2 Patient will have increased HS length to 65 degrees bil and hip extension to 10 degrees bil  Baseline:  05/06/2021 MET  3 The patient will rise sit to stand 5x in 16 sec or less  Baseline: 05/06/2021 MET  4 Pt will report ability to walk 1/2 mile with pain/discomfort 3/10 or less  Baseline: 05/06/2021 MET    LONG TERM GOALS:    LTG Name Target Date Goal status  1 The patient will be independent in a safe self progression of HEP  Baseline: 08/14/2021 MET  2 Pt will report ability to walk dogs for at least 25 minutes at a time or 1 mile with pain 3/10 or less  Baseline:  able to walk without knee pain 08/14/2021 MET  3 Bil knee and shoulder strength improved to 4+/5 and hip strength to 4/5 needed for standing/walking longer periods of time at work and for hiking and lifting grandkids  Baseline: 08/14/2021 Partially met  4 FOTO score of knees to increase to 65% and shoulder to at least 64% indicating improved function with less pain.  Baseline:  Knee  54, shoulder 60 08/14/2021 Partially met  5 R shoulder flexion and abduction to increase to at least 130 degrees to allow pt to reach into overhead cabinets. Baseline: 06/17/2021 Goal Met 05/20/21    PLAN:D/C PT to HEP today PHYSICAL THERAPY DISCHARGE SUMMARY  Visits from Start of Care: 34  Current functional level related to goals / functional outcomes: See above for current status.  Pt has HEP in place and will continue with this at home and in the gym.     Remaining deficits: Chronic shoulder and knee pain that pt will continue with HEP to maintain function,     Education / Equipment: HEP, posture/mechanics    Patient agrees to discharge. Patient goals were partially met. Patient is being discharged due to being pleased with the current functional level.  Sigurd Sos, PT 08/12/21 7:58 AM   [facility] [address] [place], [place] [address] Phone # [phone] Fax [phone]

## 2021-09-07 LAB — HM DIABETES EYE EXAM

## 2021-09-08 ENCOUNTER — Encounter: Payer: Self-pay | Admitting: Family Medicine

## 2021-09-21 ENCOUNTER — Encounter: Payer: Self-pay | Admitting: Family Medicine

## 2021-09-30 ENCOUNTER — Ambulatory Visit: Payer: Self-pay | Admitting: Family Medicine

## 2021-09-30 ENCOUNTER — Ambulatory Visit: Payer: Self-pay

## 2021-09-30 ENCOUNTER — Encounter: Payer: Self-pay | Admitting: Family Medicine

## 2021-09-30 VITALS — BP 126/88 | Ht 73.0 in | Wt 235.0 lb

## 2021-09-30 DIAGNOSIS — M7581 Other shoulder lesions, right shoulder: Secondary | ICD-10-CM

## 2021-09-30 NOTE — Assessment & Plan Note (Signed)
Completed PRP injection today.  °

## 2021-09-30 NOTE — Progress Notes (Signed)
  Noah Lane - 66 y.o. male MRN [id number]  Date of birth: 09-02-1955  SUBJECTIVE:  Including CC & ROS.  No chief complaint on file.   Noah Lane is a 66 y.o. male that is here for right shoulder PRP injection.    Review of Systems See HPI   HISTORY: Past Medical, Surgical, Social, and Family History Reviewed & Updated per EMR.   Pertinent Historical Findings include:  Past Medical History:  Diagnosis Date   Diabetes mellitus without complication ([place])     Past Surgical History:  Procedure Laterality Date   HERNIA REPAIR  2012     PHYSICAL EXAM:  VS: BP 126/88 (BP Location: Left Arm, Patient Position: Sitting)   Ht '6\' 1"'$  (1.854 m)   Wt 235 lb (106.6 kg)   BMI 31.00 kg/m  Physical Exam Gen: NAD, alert, cooperative with exam, well-appearing MSK:  Neurovascularly intact           Aspiration/Injection Procedure Note Noah Lane 1955/05/20  Procedure: Injection Indications: right shoulder pain  Procedure Details Consent: Risks of procedure as well as the alternatives and risks of each were explained to the (patient/caregiver).  Consent for procedure obtained. Time Out: Verified patient identification, verified procedure, site/side was marked, verified correct patient position, special equipment/implants available, medications/allergies/relevent history reviewed, required imaging and test results available.  Performed.  The area was cleaned with iodine and alcohol swabs.    The right subacromial space was injected using 3 cc's of 1% lidocaine with a 22 1 1/2" needle.  The syringe was switched and 6 cc of PRP was injected.  Ultrasound was used. Images were obtained in long views showing the injection.  A sterile dressing was applied.  Patient did tolerate procedure well.    ASSESSMENT & PLAN:   Rotator cuff tendinitis, right Completed PRP injection today.

## 2021-09-30 NOTE — Patient Instructions (Signed)
Good to see you Please no anti-inflammatories for the next 7 days. You can use acetaminophen as needed. Please send me a message in MyChart with any questions or updates.  Please see me back 4-6 weeks  --Dr. Raeford Razor

## 2021-10-12 ENCOUNTER — Encounter: Payer: Self-pay | Admitting: Family Medicine

## 2021-10-13 ENCOUNTER — Encounter: Payer: Self-pay | Admitting: Family Medicine

## 2021-10-15 ENCOUNTER — Encounter: Payer: Self-pay | Admitting: Family Medicine

## 2021-10-16 ENCOUNTER — Ambulatory Visit (HOSPITAL_BASED_OUTPATIENT_CLINIC_OR_DEPARTMENT_OTHER)
Admission: RE | Admit: 2021-10-16 | Discharge: 2021-10-16 | Disposition: A | Discharge status: Home / Self Care | Payer: Medicare Other | Source: Ambulatory Visit | Attending: Family Medicine | Admitting: Family Medicine

## 2021-10-16 ENCOUNTER — Encounter: Payer: Self-pay | Admitting: Family Medicine

## 2021-10-16 ENCOUNTER — Ambulatory Visit (INDEPENDENT_AMBULATORY_CARE_PROVIDER_SITE_OTHER): Payer: Medicare Other | Admitting: Family Medicine

## 2021-10-16 VITALS — BP 130/72 | Ht 73.0 in | Wt 235.0 lb

## 2021-10-16 DIAGNOSIS — S39012A Strain of muscle, fascia and tendon of lower back, initial encounter: Secondary | ICD-10-CM

## 2021-10-16 NOTE — Patient Instructions (Signed)
Good to see you Please use heat  Please continue to stretch  I will call with the xray results.  I have made a referral to physical therapy   Please send me a message in MyChart with any questions or updates.  Please see me back in 4 weeks.   --Dr. Raeford Razor

## 2021-10-16 NOTE — Assessment & Plan Note (Signed)
Acutely occurring after 2 falls this past week.  1 occurred outside the hotel and 1 occurred at the airport.  Does have pain and tenderness over the left SI joint. -Counseled on home exercise therapy and supportive care. -X-ray. -Referral to physical therapy. -Could consider injection.

## 2021-10-16 NOTE — Progress Notes (Signed)
  Noah Lane - 66 y.o. male MRN [id number]  Date of birth: 06-Dec-1955  SUBJECTIVE:  Including CC & ROS.  No chief complaint on file.   Noah Lane is a 66 y.o. male that is presenting with low back pain following 2 falls that he had while he was in [place].  The pain is associated with the low back as well as the left SI joint.  No radicular pain.    Review of Systems See HPI   HISTORY: Past Medical, Surgical, Social, and Family History Reviewed & Updated per EMR.   Pertinent Historical Findings include:  Past Medical History:  Diagnosis Date   Diabetes mellitus without complication ([place])     Past Surgical History:  Procedure Laterality Date   HERNIA REPAIR  2012     PHYSICAL EXAM:  VS: BP 130/72 (BP Location: Right Arm, Patient Position: Sitting)   Ht '6\' 1"'$  (1.854 m)   Wt 235 lb (106.6 kg)   BMI 31.00 kg/m  Physical Exam Gen: NAD, alert, cooperative with exam, well-appearing MSK:  Neurovascularly intact       ASSESSMENT & PLAN:   Strain of lumbar region Acutely occurring after 2 falls this past week.  1 occurred outside the hotel and 1 occurred at the airport.  Does have pain and tenderness over the left SI joint. -Counseled on home exercise therapy and supportive care. -X-ray. -Referral to physical therapy. -Could consider injection.

## 2021-10-19 ENCOUNTER — Ambulatory Visit: Payer: Medicare Other | Attending: Family Medicine

## 2021-10-19 ENCOUNTER — Other Ambulatory Visit: Payer: Self-pay

## 2021-10-19 ENCOUNTER — Telehealth: Payer: Self-pay | Admitting: Family Medicine

## 2021-10-19 ENCOUNTER — Encounter: Payer: Self-pay | Admitting: Family Medicine

## 2021-10-19 DIAGNOSIS — R262 Difficulty in walking, not elsewhere classified: Secondary | ICD-10-CM | POA: Diagnosis present

## 2021-10-19 DIAGNOSIS — M6281 Muscle weakness (generalized): Secondary | ICD-10-CM | POA: Diagnosis present

## 2021-10-19 DIAGNOSIS — S39012A Strain of muscle, fascia and tendon of lower back, initial encounter: Secondary | ICD-10-CM | POA: Insufficient documentation

## 2021-10-19 DIAGNOSIS — M545 Low back pain, unspecified: Secondary | ICD-10-CM | POA: Diagnosis not present

## 2021-10-19 NOTE — Telephone Encounter (Signed)
Informed of results.   Rosemarie Ax, MD [facility] 10/19/2021, 1:29 PM

## 2021-10-19 NOTE — Therapy (Signed)
OUTPATIENT PHYSICAL THERAPY THORACOLUMBAR EVALUATION   Patient Name: Noah Lane MRN: [id number] DOB:12-16-1955, 66 y.o., male Today's Date: 10/19/2021   PT End of Session - 10/19/21 1406     Visit Number 1    Date for PT Re-Evaluation 12/14/21    Authorization Type Medicare    Progress Note Due on Visit 10    PT Start Time 1400    PT Stop Time 1450    PT Time Calculation (min) 50 min    Activity Tolerance Patient tolerated treatment well    Behavior During Therapy [facility] for tasks assessed/performed             Past Medical History:  Diagnosis Date   Diabetes mellitus without complication ([place])    Past Surgical History:  Procedure Laterality Date   HERNIA REPAIR  2012   Patient Active Problem List   Diagnosis Date Noted   Strain of lumbar region 10/16/2021   Strain of calf muscle 07/22/2021   Patellofemoral pain syndrome of both knees 02/24/2021   Rotator cuff tendinitis, right 12/28/2019   Hip pain, bilateral 05/28/2019   Chronic bilateral low back pain without sciatica 05/28/2019   Diabetes mellitus type 2 in obese ([place]) 05/28/2019   Erectile dysfunction 05/28/2019   Pain and swelling of left knee 03/12/2019   Lateral epicondylitis, right elbow 03/15/2017    PCP: [name], DO  REFERRING PROVIDER: Rosemarie Ax, MD   REFERRING DIAG: [phone] (ICD-10-CM) - Strain of lumbar region, initial encounter   Rationale for Evaluation and Treatment Rehabilitation  THERAPY DIAG:  Acute bilateral low back pain without sciatica  Difficulty in walking, not elsewhere classified  Muscle weakness (generalized)  ONSET DATE: 10/16/2021   SUBJECTIVE:                                                                                                                                                                                           SUBJECTIVE STATEMENT: Patient states he fell x 2 while out of town.  One outdoors and once in the airport.  He c/o low  back pain without radicular symptoms. PERTINENT HISTORY:  na  PAIN:  Are you having pain? Yes: NPRS scale: 0/10 Pain location: pain when moving quickly Pain description: sharp Aggravating factors: quick movements Relieving factors: meds   PRECAUTIONS: Fall  WEIGHT BEARING RESTRICTIONS No  FALLS:  Has patient fallen in last 6 months? Yes. Number of falls 2   OCCUPATION: real Sport and exercise psychologist  PLOF: Independent, Independent with basic ADLs, Independent with household mobility without device, Independent with community mobility without device, Independent with homemaking with ambulation, Independent with gait, Independent  with transfers, and Vocation/Vocational requirements: travels for work  PATIENT GOALS to be able to resume plof and avoid falling again   OBJECTIVE:   DIAGNOSTIC FINDINGS:  Five lumbar type vertebra. There is no acute fracture or subluxation of the lumbar spine. There is multilevel degenerative changes with disc space narrowing, endplate irregularity and spurring/osteophyte. Multilevel facet arthropathy. The visualized posterior elements appear intact. There is atherosclerotic calcification of the aorta. The soft tissues are grossly unremarkable.  PATIENT SURVEYS:  FOTO 66 goal is 53  SCREENING FOR RED FLAGS: Bowel or bladder incontinence: No Spinal tumors: No Cauda equina syndrome: No Compression fracture: No Abdominal aneurysm: No  COGNITION:  Overall cognitive status: Within functional limits for tasks assessed     SENSATION: Diabetic neuropathy that was present prior to falling but no radicular symptoms  MUSCLE LENGTH: Hamstrings: Right 45 deg; Left 50 deg Thomas test: Right pos ; Left pos   POSTURE: rounded shoulders, increased lumbar lordosis, and anterior pelvic tilt   LUMBAR ROM:   Active  A/PROM  eval  Flexion Fingertips to just past patella  Extension WNL  Right lateral flexion Fingertips to joint line  Left lateral flexion  Fingertips to joint line  Right rotation   Left rotation    (Blank rows = not tested)  LOWER EXTREMITY ROM:     WFL  LOWER EXTREMITY MMT:    Generally 4+/5 bilaterally with exception of hip ER and extension 3+/5 bilaterally  LUMBAR SPECIAL TESTS:  Straight leg raise test: Negative and FABER test: Positive  FUNCTIONAL TESTS:  5 times sit to stand: 13.74 sec Timed up and go (TUG): 8.70  GAIT: Distance walked: 50 Assistive device utilized: None Level of assistance: Complete Independence Comments: Externally rotated LE's bilaterally    TODAY'S TREATMENT  Initiated HEP and initial eval completed   PATIENT EDUCATION:  Education details: Initiated HEP Person educated: Patient Education method: Consulting civil engineer, Media planner, Verbal cues, and Handouts Education comprehension: verbalized understanding, returned demonstration, and verbal cues required   HOME EXERCISE PROGRAM:  Access Code: [email or web address] URL: [email or web address] Date: 10/19/2021 Prepared by: Candyce Churn  Exercises - Supine Hamstring Stretch with Strap  - 1 x daily - 7 x weekly - 1 sets - 3 reps - 20 hold - Supine ITB Stretch with Strap  - 1 x daily - 7 x weekly - 1 sets - 3 reps - 20 hold - Thomas Stretch on Table  - 1 x daily - 7 x weekly - 1 sets - 3 reps - 20 hold - Supine Posterior Pelvic Tilt  - 1 x daily - 7 x weekly - 3 sets - 10 reps - Supine 90/90 Alternating Heel Touches with Posterior Pelvic Tilt  - 1 x daily - 7 x weekly - 3 sets - 10 reps - Standing Quad Stretch with Table and Chair Support  - 1 x daily - 7 x weekly - 1 sets - 3 reps - 20 hold  ASSESSMENT:  CLINICAL IMPRESSION: Patient is a 66 y.o. male who was seen today for physical therapy evaluation and treatment for low back pain following two falls. He presents with limited lumbar ROM and balance deficits.  He would benefit from skilled PT to address LE flexibility issues and core strength along with balance training to  reduce fall risk.     OBJECTIVE IMPAIRMENTS Abnormal gait, decreased activity tolerance, decreased balance, decreased endurance, decreased mobility, difficulty walking, decreased ROM, decreased strength, decreased safety awareness, increased fascial restrictions, increased muscle spasms, impaired flexibility, obesity,  and pain.   ACTIVITY LIMITATIONS carrying, lifting, bending, sitting, standing, squatting, sleeping, stairs, transfers, bed mobility, bathing, dressing, and hygiene/grooming  PARTICIPATION LIMITATIONS: meal prep, cleaning, laundry, driving, shopping, community activity, and yard work  [place] and 1-2 comorbidities: obesity and diabetes  are also affecting patient's functional outcome.   REHAB POTENTIAL: Good  CLINICAL DECISION MAKING: Stable/uncomplicated  EVALUATION COMPLEXITY: Low   GOALS: Goals reviewed with patient? Yes  SHORT TERM GOALS: Target date: 11/16/2021  Pain report to be no greater than 4/10  Baseline: Goal status: INITIAL  2.  Patient will be independent with initial HEP  Baseline:  Goal status: INITIAL  3.  Patient to be able to sleep 4 consecutive hours without waking with pain Baseline:  Goal status: INITIAL  LONG TERM GOALS: Target date: 12/14/2021  Patient to report pain no greater than 2/10  Baseline:  Goal status: INITIAL  2.  Patient to be independent with advanced HEP  Baseline:  Goal status: INITIAL  3.  Patient to be able to sleep through the night  Baseline:  Goal status: INITIAL  4.  Patient to be able to perform all transfers and bed mobility with pain no greater than 2/10 Baseline:  Goal status: INITIAL  5.  TUG  and 5 times sit to stand to improve by 2 seconds Baseline:  Goal status: INITIAL  6.  FOTO to be 70 Baseline:  Goal status: INITIAL   PLAN: PT FREQUENCY: 2x/week  PT DURATION: 8 weeks  PLANNED INTERVENTIONS: Therapeutic exercises, Therapeutic activity, Neuromuscular re-education,  Balance training, Gait training, Patient/Family education, Self Care, Joint mobilization, Stair training, Aquatic Therapy, Dry Needling, Electrical stimulation, Spinal mobilization, Cryotherapy, Moist heat, Taping, Ultrasound, Ionotophoresis '4mg'$ /ml Dexamethasone, Manual therapy, and Re-evaluation.  PLAN FOR NEXT SESSION: Review HEP, NuStep, progress core strengthening activities.     Anderson Malta B. Isabel Ardila, PT 10/19/21 3:44 PM  [facility] [address], [place], [place] [address] Phone # [phone] Fax [phone]

## 2021-10-20 ENCOUNTER — Encounter: Payer: Self-pay | Admitting: Family Medicine

## 2021-10-20 ENCOUNTER — Ambulatory Visit (INDEPENDENT_AMBULATORY_CARE_PROVIDER_SITE_OTHER): Payer: Medicare Other | Admitting: Family Medicine

## 2021-10-20 VITALS — BP 138/84 | HR 51 | Temp 98.3°F | Ht 73.0 in | Wt 245.0 lb

## 2021-10-20 DIAGNOSIS — E669 Obesity, unspecified: Secondary | ICD-10-CM | POA: Diagnosis not present

## 2021-10-20 DIAGNOSIS — E782 Mixed hyperlipidemia: Secondary | ICD-10-CM | POA: Diagnosis not present

## 2021-10-20 DIAGNOSIS — E1169 Type 2 diabetes mellitus with other specified complication: Secondary | ICD-10-CM

## 2021-10-20 LAB — COMPREHENSIVE METABOLIC PANEL
ALT: 19 U/L (ref 0–53)
AST: 16 U/L (ref 0–37)
Albumin: 4.3 g/dL (ref 3.5–5.2)
Alkaline Phosphatase: 34 U/L — ABNORMAL LOW (ref 39–117)
BUN: 25 mg/dL — ABNORMAL HIGH (ref 6–23)
CO2: 29 mEq/L (ref 19–32)
Calcium: 8.5 mg/dL (ref 8.4–10.5)
Chloride: 103 mEq/L (ref 96–112)
Creatinine, Ser: 1.06 mg/dL (ref 0.40–1.50)
GFR: 72.52 mL/min (ref >60.00)
Glucose, Bld: 135 mg/dL — ABNORMAL HIGH (ref 70–99)
Potassium: 4.3 mEq/L (ref 3.5–5.1)
Sodium: 136 mEq/L (ref 135–145)
Total Bilirubin: 1 mg/dL (ref 0.2–1.2)
Total Protein: 6.1 g/dL (ref 6.0–8.3)

## 2021-10-20 LAB — LIPID PANEL
Cholesterol: 141 mg/dL (ref 0–200)
HDL: 38.6 mg/dL — ABNORMAL LOW (ref >39.00)
LDL Cholesterol: 76 mg/dL (ref 0–99)
NonHDL: 101.97
Total CHOL/HDL Ratio: 4
Triglycerides: 131 mg/dL (ref 0.0–149.0)
VLDL: 26.2 mg/dL (ref 0.0–40.0)

## 2021-10-20 LAB — MICROALBUMIN / CREATININE URINE RATIO
Creatinine,U: 112.6 mg/dL
Microalb Creat Ratio: 2.1 mg/g (ref 0.0–30.0)
Microalb, Ur: 2.4 mg/dL — ABNORMAL HIGH (ref 0.0–1.9)

## 2021-10-20 LAB — HEMOGLOBIN A1C: Hgb A1c MFr Bld: 7.1 % — ABNORMAL HIGH (ref 4.6–6.5)

## 2021-10-20 NOTE — Patient Instructions (Signed)
Give us 2-3 business days to get the results of your labs back.   Keep the diet clean and stay active.  I recommend getting the flu shot in mid October. This suggestion would change if the CDC comes out with a different recommendation.   Let us know if you need anything. 

## 2021-10-20 NOTE — Progress Notes (Signed)
Subjective:   Chief Complaint  Patient presents with   Follow-up    Noah Lane is a 66 y.o. male here for follow-up of diabetes.   Noah Lane's self monitored glucose range is mid 100's.  Patient denies hypoglycemic reactions. He has continuous monitoring.  Patient does not require insulin.   Medications include: Metformin XR 500 mg/d Diet is fair.  Exercise: walking. Has had issues w falls. Working w PT.   Hyperlipidemia Patient presents for mixed hyperlipidemia follow up. Currently being treated with Lipitor 20 mg/d and compliance with treatment thus far has been good. He denies myalgias. Diet/exercise as above.  The patient is not known to have coexisting coronary artery disease. No Cp or SOB.   Past Medical History:  Diagnosis Date   Diabetes mellitus type 2 in obese ([facility])    Mixed hyperlipidemia      Related testing: Retinal exam: Done Pneumovax: done  Objective:  BP 138/84   Pulse (!) 51   Temp 98.3 F (36.8 C) (Oral)   Ht '6\' 1"'$  (1.854 m)   Wt 245 lb (111.1 kg)   SpO2 98%   BMI 32.32 kg/m  General:  Well developed, well nourished, in no apparent distress Skin:  Warm, no pallor or diaphoresis on exposed surfaces Lungs:  CTAB, no access msc use Cardio:  Reg rhythm, bradycardic, no bruits, no LE edema Psych: Age appropriate judgment and insight  Assessment:   Diabetes mellitus type 2 in obese ([place]) - Plan: Comprehensive metabolic panel, Hemoglobin A1c, Lipid panel, Microalbumin / creatinine urine ratio  Mixed hyperlipidemia   Plan:   Chronic, stable. Cont Metformin XR 500 mg/d. Counseled on diet and exercise. Chronic, stable. Cont Lipitor 20 mg/d.  F/u in 6 mo. The patient voiced understanding and agreement to the plan.  [place], DO 10/20/21 7:43 AM

## 2021-10-21 ENCOUNTER — Encounter: Payer: Self-pay | Admitting: Physical Therapy

## 2021-10-21 ENCOUNTER — Ambulatory Visit: Payer: Medicare Other | Admitting: Physical Therapy

## 2021-10-21 ENCOUNTER — Encounter: Payer: Self-pay | Admitting: Family Medicine

## 2021-10-21 DIAGNOSIS — R262 Difficulty in walking, not elsewhere classified: Secondary | ICD-10-CM

## 2021-10-21 DIAGNOSIS — M545 Low back pain, unspecified: Secondary | ICD-10-CM

## 2021-10-21 DIAGNOSIS — M6281 Muscle weakness (generalized): Secondary | ICD-10-CM

## 2021-10-21 NOTE — Therapy (Signed)
OUTPATIENT PHYSICAL THERAPY TREATMENT NOTE   Patient Name: Noah Lane MRN: [id number] DOB:Jan 12, 1956, 66 y.o., male Today's Date: 10/21/2021   REFERRING PROVIDER: Rosemarie Ax, MD   END OF SESSION:   PT End of Session - 10/21/21 0802     Visit Number 2    Date for PT Re-Evaluation 12/14/21    Authorization Type Medicare    Progress Note Due on Visit 10    PT Start Time 0803    PT Stop Time 8338    PT Time Calculation (min) 44 min    Activity Tolerance Patient tolerated treatment well    Behavior During Therapy [facility] for tasks assessed/performed             Past Medical History:  Diagnosis Date   Diabetes mellitus type 2 in obese ([place])    Mixed hyperlipidemia    Past Surgical History:  Procedure Laterality Date   HERNIA REPAIR  2012   Patient Active Problem List   Diagnosis Date Noted   Mixed hyperlipidemia 10/20/2021   Strain of lumbar region 10/16/2021   Strain of calf muscle 07/22/2021   Patellofemoral pain syndrome of both knees 02/24/2021   Rotator cuff tendinitis, right 12/28/2019   Hip pain, bilateral 05/28/2019   Chronic bilateral low back pain without sciatica 05/28/2019   Diabetes mellitus type 2 in obese ([place]) 05/28/2019   Erectile dysfunction 05/28/2019   Pain and swelling of left knee 03/12/2019   Lateral epicondylitis, right elbow 03/15/2017    REFERRING DIAG: S39.012A (ICD-10-CM) - Strain of lumbar region, initial encounter    THERAPY DIAG:  Acute bilateral low back pain without sciatica  Difficulty in walking, not elsewhere classified  Muscle weakness (generalized)  Rationale for Evaluation and Treatment Rehabilitation  ONSET DATE: 10/16/2021    PERTINENT HISTORY: fall  PRECAUTIONS: fall, 2x in past 110mo OCCUPATION: real estate broker   PLOF: Independent, Vocation/Vocational requirements: travels for work   PATIENT GOALS to be able to resume plof and avoid falling again  SUBJECTIVE: When I can de-stress the pain goes  away.  That second fall I took really compressed my back. I was in a lot of pain yesterday so I didn't do my HEP.  PAIN:  Are you having pain? Yes: NPRS scale: 7/10 Pain location: pain when moving quickly Pain description: sharp, LBP without radicular symptoms Aggravating factors: quick movements Relieving factors: meds   OBJECTIVE: (objective measures completed at initial evaluation unless otherwise dated)   DIAGNOSTIC FINDINGS:  Five lumbar type vertebra. There is no acute fracture or subluxation of the lumbar spine. There is multilevel degenerative changes with disc space narrowing, endplate irregularity and spurring/osteophyte. Multilevel facet arthropathy. The visualized posterior elements appear intact. There is atherosclerotic calcification of the aorta. The soft tissues are grossly unremarkable.   PATIENT SURVEYS:  FOTO 420goal is 763  SCREENING FOR RED FLAGS: Bowel or bladder incontinence: No Spinal tumors: No Cauda equina syndrome: No Compression fracture: No Abdominal aneurysm: No   COGNITION:           Overall cognitive status: Within functional limits for tasks assessed                          SENSATION: Diabetic neuropathy that was present prior to falling but no radicular symptoms   MUSCLE LENGTH: Hamstrings: Right 45 deg; Left 50 deg Thomas test: Right pos ; Left pos    POSTURE: rounded shoulders, increased lumbar lordosis, and  anterior pelvic tilt     LUMBAR ROM:    Active  A/PROM  eval  Flexion Fingertips to just past patella  Extension WNL  Right lateral flexion Fingertips to joint line  Left lateral flexion Fingertips to joint line  Right rotation    Left rotation     (Blank rows = not tested)   LOWER EXTREMITY ROM:      WFL   LOWER EXTREMITY MMT:     Generally 4+/5 bilaterally with exception of hip ER and extension 3+/5 bilaterally   LUMBAR SPECIAL TESTS:  Straight leg raise test: Negative and FABER test: Positive   FUNCTIONAL  TESTS:  5 times sit to stand: 13.74 sec Timed up and go (TUG): 8.70   GAIT: Distance walked: 50 Assistive device utilized: None Level of assistance: Complete Independence Comments: Externally rotated LE's bilaterally       TODAY'S TREATMENT  10/21/21: NuStep L4 x 6' PT present to discuss symptom  Supine hamstring and IT band stretch with strap 2x30" bil Supine posterior pelvic tilt x20 Pelvic tilt to bridge with breath cueing by PT x 10 Supine 90/90 LE toe taps (Pt with midline abdominal bulge so modified to march to 90/90 with leg lowering and return to 90/90), alt LE 1x10 Seated reverse sit up holding orange weighted ball at chest x 15 Seated orange plyo ball diagonals hip to shoulder 2x5 Review of standing quad and supine off edge of table hip flexor stretch from HEP, option to stretch quad in standing using opp hand to back of shoe  Evaluation: Initiated HEP and initial eval completed     PATIENT EDUCATION:  Education details: Initiated HEP Person educated: Patient Education method: Consulting civil engineer, Media planner, Verbal cues, and Handouts Education comprehension: verbalized understanding, returned demonstration, and verbal cues required     HOME EXERCISE PROGRAM:         Access Code: [email or web address] URL: [email or web address] Date: 10/21/2021 Prepared by: Venetia Night Khalil Belote  Exercises - Supine Hamstring Stretch with Strap  - 1 x daily - 7 x weekly - 1 sets - 3 reps - 20 hold - Supine ITB Stretch with Strap  - 1 x daily - 7 x weekly - 1 sets - 3 reps - 20 hold - Thomas Stretch on Table  - 1 x daily - 7 x weekly - 1 sets - 3 reps - 20 hold - Supine Posterior Pelvic Tilt  - 1 x daily - 7 x weekly - 3 sets - 10 reps - Supine 90/90 Alternating Heel Touches with Posterior Pelvic Tilt  - 1 x daily - 7 x weekly - 3 sets - 10 reps - Standing Quad Stretch with Table and Chair Support  - 1 x daily - 7 x weekly - 1 sets - 3 reps - 20 hold - Supine Bridge with Spinal  Articulation  - 1 x daily - 7 x weekly - 2 sets - 10 reps - Modified Eccentric Sit Up in Backless Chair  - 1 x daily - 7 x weekly - 2 sets - 10 reps ASSESSMENT:   CLINICAL IMPRESSION: Pt had not done HEP so reviewed and progressed today.  Pt reported relief with reverse weighted crunches today.  He needed max cueing for breath coordination for articulating bridge using VC and TC.  Updated HEP to include bridge and reverse crunch.  Pt only getting pain with sudden movements and these pains don't last.  He stated he "felt better" end of session.       OBJECTIVE  IMPAIRMENTS Abnormal gait, decreased activity tolerance, decreased balance, decreased endurance, decreased mobility, difficulty walking, decreased ROM, decreased strength, decreased safety awareness, increased fascial restrictions, increased muscle spasms, impaired flexibility, obesity, and pain.    ACTIVITY LIMITATIONS carrying, lifting, bending, sitting, standing, squatting, sleeping, stairs, transfers, bed mobility, bathing, dressing, and hygiene/grooming   PARTICIPATION LIMITATIONS: meal prep, cleaning, laundry, driving, shopping, community activity, and yard work   [place] and 1-2 comorbidities: obesity and diabetes  are also affecting patient's functional outcome.    REHAB POTENTIAL: Good   CLINICAL DECISION MAKING: Stable/uncomplicated   EVALUATION COMPLEXITY: Low     GOALS: Goals reviewed with patient? Yes   SHORT TERM GOALS: Target date: 11/16/2021   Pain report to be no greater than 4/10  Baseline: Goal status: INITIAL   2.  Patient will be independent with initial HEP  Baseline:  Goal status: ongoing   3.  Patient to be able to sleep 4 consecutive hours without waking with pain Baseline:  Goal status: INITIAL   LONG TERM GOALS: Target date: 12/14/2021   Patient to report pain no greater than 2/10  Baseline:  Goal status: INITIAL   2.  Patient to be independent with advanced HEP  Baseline:   Goal status: INITIAL   3.  Patient to be able to sleep through the night  Baseline:  Goal status: INITIAL   4.  Patient to be able to perform all transfers and bed mobility with pain no greater than 2/10 Baseline:  Goal status: INITIAL   5.  TUG  and 5 times sit to stand to improve by 2 seconds Baseline:  Goal status: INITIAL   6.  FOTO to be 70 Baseline:  Goal status: INITIAL     PLAN: PT FREQUENCY: 2x/week   PT DURATION: 8 weeks   PLANNED INTERVENTIONS: Therapeutic exercises, Therapeutic activity, Neuromuscular re-education, Balance training, Gait training, Patient/Family education, Self Care, Joint mobilization, Stair training, Aquatic Therapy, Dry Needling, Electrical stimulation, Spinal mobilization, Cryotherapy, Moist heat, Taping, Ultrasound, Ionotophoresis '4mg'$ /ml Dexamethasone, Manual therapy, and Re-evaluation.   PLAN FOR NEXT SESSION: Review HEP, NuStep, progress core strengthening activities.       Jlee Harkless, PT 10/21/21 8:50 AM

## 2021-10-28 ENCOUNTER — Encounter: Payer: Self-pay | Admitting: Physical Therapy

## 2021-10-28 ENCOUNTER — Ambulatory Visit: Payer: Medicare Other | Admitting: Physical Therapy

## 2021-10-28 DIAGNOSIS — M545 Low back pain, unspecified: Secondary | ICD-10-CM

## 2021-10-28 DIAGNOSIS — M6281 Muscle weakness (generalized): Secondary | ICD-10-CM

## 2021-10-28 DIAGNOSIS — R262 Difficulty in walking, not elsewhere classified: Secondary | ICD-10-CM

## 2021-10-28 NOTE — Therapy (Signed)
OUTPATIENT PHYSICAL THERAPY TREATMENT NOTE   Patient Name: Noah Lane MRN: [id number] DOB:04-29-1955, 66 y.o., male Today's Date: 10/28/2021   REFERRING PROVIDER: Rosemarie Ax, MD   END OF SESSION:   PT End of Session - 10/28/21 0803     Visit Number 3    Date for PT Re-Evaluation 12/14/21    Authorization Type Medicare    Progress Note Due on Visit 10    PT Start Time 0803    PT Stop Time 5732    PT Time Calculation (min) 44 min    Activity Tolerance Patient tolerated treatment well    Behavior During Therapy [facility] for tasks assessed/performed              Past Medical History:  Diagnosis Date   Diabetes mellitus type 2 in obese [facility])    Mixed hyperlipidemia    Past Surgical History:  Procedure Laterality Date   HERNIA REPAIR  2012   Patient Active Problem List   Diagnosis Date Noted   Mixed hyperlipidemia 10/20/2021   Strain of lumbar region 10/16/2021   Strain of calf muscle 07/22/2021   Patellofemoral pain syndrome of both knees 02/24/2021   Rotator cuff tendinitis, right 12/28/2019   Hip pain, bilateral 05/28/2019   Chronic bilateral low back pain without sciatica 05/28/2019   Diabetes mellitus type 2 in obese ([place]) 05/28/2019   Erectile dysfunction 05/28/2019   Pain and swelling of left knee 03/12/2019   Lateral epicondylitis, right elbow 03/15/2017    REFERRING DIAG: S39.012A (ICD-10-CM) - Strain of lumbar region, initial encounter    THERAPY DIAG:  Acute bilateral low back pain without sciatica  Difficulty in walking, not elsewhere classified  Muscle weakness (generalized)  Rationale for Evaluation and Treatment Rehabilitation  ONSET DATE: 10/16/2021    PERTINENT HISTORY: fall  PRECAUTIONS: fall, 2x in past 6mo OCCUPATION: real estate broker   PLOF: Independent, Vocation/Vocational requirements: travels for work   PATIENT GOALS to be able to resume plof and avoid falling again  SUBJECTIVE: My pain started reducing this  week. I am concerned I've had to get away from my shoulder exercises and will start having return of that pain.  PAIN:  Are you having pain? Yes: NPRS scale: 5-6/10 Pain location: pain when moving quickly Pain description: sharp, LBP without radicular symptoms Aggravating factors: quick movements Relieving factors: meds   OBJECTIVE: (objective measures completed at initial evaluation unless otherwise dated)   DIAGNOSTIC FINDINGS:  Five lumbar type vertebra. There is no acute fracture or subluxation of the lumbar spine. There is multilevel degenerative changes with disc space narrowing, endplate irregularity and spurring/osteophyte. Multilevel facet arthropathy. The visualized posterior elements appear intact. There is atherosclerotic calcification of the aorta. The soft tissues are grossly unremarkable.   PATIENT SURVEYS:  FOTO 458goal is 755  SCREENING FOR RED FLAGS: Bowel or bladder incontinence: No Spinal tumors: No Cauda equina syndrome: No Compression fracture: No Abdominal aneurysm: No   COGNITION:           Overall cognitive status: Within functional limits for tasks assessed                          SENSATION: Diabetic neuropathy that was present prior to falling but no radicular symptoms   MUSCLE LENGTH: Hamstrings: Right 45 deg; Left 50 deg Thomas test: Right pos ; Left pos    POSTURE: rounded shoulders, increased lumbar lordosis, and anterior pelvic tilt  LUMBAR ROM:    Active  A/PROM  eval  Flexion Fingertips to just past patella  Extension WNL  Right lateral flexion Fingertips to joint line  Left lateral flexion Fingertips to joint line  Right rotation    Left rotation     (Blank rows = not tested)   LOWER EXTREMITY ROM:      WFL   LOWER EXTREMITY MMT:     Generally 4+/5 bilaterally with exception of hip ER and extension 3+/5 bilaterally   LUMBAR SPECIAL TESTS:  Straight leg raise test: Negative and FABER test: Positive   FUNCTIONAL  TESTS:  5 times sit to stand: 13.74 sec Timed up and go (TUG): 8.70   GAIT: Distance walked: 50 Assistive device utilized: None Level of assistance: Complete Independence Comments: Externally rotated LE's bilaterally       TODAY'S TREATMENT  10/28/21: NuStep L5 x 6' PT present to monitor and discuss plan for session Supine hamstring stretch with strap 2x30 bil Supine posterior pelvic tilt x 10, then pelvic tilt to bridge x 10 Supine 90/90 march x 10 alt, then add arms for dead bug x 10 (dead bug added to HEP) Seated reverse sit up holding orange weighted ball at chest x 15 Seated lat pulldown 1x10 45lb, 1x10 55lb Resisted backward walking in row with pulleys 15lb x 5, supervision Standing cable pulley row 20lb 1x10, 25lb 1x10 Standing on airex pad march taps to 2nd step alt x 20, no UE support Rockerboard x2' ant/post light bil UE   10/21/21: NuStep L4 x 6' PT present to discuss symptom  Supine hamstring and IT band stretch with strap 2x30" bil Supine posterior pelvic tilt x20 Pelvic tilt to bridge with breath cueing by PT x 10 Supine 90/90 LE toe taps (Pt with midline abdominal bulge so modified to march to 90/90 with leg lowering and return to 90/90), alt LE 1x10 Seated reverse sit up holding orange weighted ball at chest x 15 Seated orange plyo ball diagonals hip to shoulder 2x5 Review of standing quad and supine off edge of table hip flexor stretch from HEP, option to stretch quad in standing using opp hand to back of shoe  Evaluation: Initiated HEP and initial eval completed     PATIENT EDUCATION:  Education details: Initiated HEP Person educated: Patient Education method: Consulting civil engineer, Media planner, Verbal cues, and Handouts Education comprehension: verbalized understanding, returned demonstration, and verbal cues required     HOME EXERCISE PROGRAM: Access Code: [email or web address] URL: [email or web address] Date: 10/28/2021 Prepared by: Venetia Night  Zuleima Haser  Exercises - Supine Hamstring Stretch with Strap  - 1 x daily - 7 x weekly - 1 sets - 3 reps - 20 hold - Supine ITB Stretch with Strap  - 1 x daily - 7 x weekly - 1 sets - 3 reps - 20 hold - Thomas Stretch on Table  - 1 x daily - 7 x weekly - 1 sets - 3 reps - 20 hold - Supine Posterior Pelvic Tilt  - 1 x daily - 7 x weekly - 3 sets - 10 reps - Standing Quad Stretch with Table and Chair Support  - 1 x daily - 7 x weekly - 1 sets - 3 reps - 20 hold - Supine Bridge with Spinal Articulation  - 1 x daily - 7 x weekly - 2 sets - 10 reps - Modified Eccentric Sit Up in Backless Chair  - 1 x daily - 7 x weekly - 2 sets - 10 reps - Dead Bug  -  1 x daily - 7 x weekly - 2 sets - 10 reps  ASSESSMENT:   CLINICAL IMPRESSION: Pt reports pain has reduced today.  He stated he was concerned he had gotten away from his UE therex for management of shoulder pain for which he had PT so incorporated more UE overlay into trunk stabilization work today with good tolerance. Added deadbug to HEP today.  Pt making good progress toward goals and reduced pain with daily tasks.       OBJECTIVE IMPAIRMENTS Abnormal gait, decreased activity tolerance, decreased balance, decreased endurance, decreased mobility, difficulty walking, decreased ROM, decreased strength, decreased safety awareness, increased fascial restrictions, increased muscle spasms, impaired flexibility, obesity, and pain.    ACTIVITY LIMITATIONS carrying, lifting, bending, sitting, standing, squatting, sleeping, stairs, transfers, bed mobility, bathing, dressing, and hygiene/grooming   PARTICIPATION LIMITATIONS: meal prep, cleaning, laundry, driving, shopping, community activity, and yard work   [place] and 1-2 comorbidities: obesity and diabetes  are also affecting patient's functional outcome.    REHAB POTENTIAL: Good   CLINICAL DECISION MAKING: Stable/uncomplicated   EVALUATION COMPLEXITY: Low     GOALS: Goals reviewed  with patient? Yes   SHORT TERM GOALS: Target date: 11/16/2021   Pain report to be no greater than 4/10  Baseline: Goal status: ongoing   2.  Patient will be independent with initial HEP  Baseline:  Goal status: ongoing   3.  Patient to be able to sleep 4 consecutive hours without waking with pain Baseline:  Goal status: ongoing   LONG TERM GOALS: Target date: 12/14/2021   Patient to report pain no greater than 2/10  Baseline:  Goal status: INITIAL   2.  Patient to be independent with advanced HEP  Baseline:  Goal status: INITIAL   3.  Patient to be able to sleep through the night  Baseline:  Goal status: INITIAL   4.  Patient to be able to perform all transfers and bed mobility with pain no greater than 2/10 Baseline:  Goal status: INITIAL   5.  TUG  and 5 times sit to stand to improve by 2 seconds Baseline:  Goal status: INITIAL   6.  FOTO to be 70 Baseline:  Goal status: INITIAL     PLAN: PT FREQUENCY: 2x/week   PT DURATION: 8 weeks   PLANNED INTERVENTIONS: Therapeutic exercises, Therapeutic activity, Neuromuscular re-education, Balance training, Gait training, Patient/Family education, Self Care, Joint mobilization, Stair training, Aquatic Therapy, Dry Needling, Electrical stimulation, Spinal mobilization, Cryotherapy, Moist heat, Taping, Ultrasound, Ionotophoresis '4mg'$ /ml Dexamethasone, Manual therapy, and Re-evaluation.   PLAN FOR NEXT SESSION: Review HEP, NuStep, progress core strengthening activities, incorporate UE strength into trunk stabilization.       Shaunee Mulkern, PT 10/28/21 8:51 AM

## 2021-11-04 ENCOUNTER — Encounter: Payer: Self-pay | Admitting: Physical Therapy

## 2021-11-04 ENCOUNTER — Ambulatory Visit: Payer: Medicare Other | Attending: Family Medicine | Admitting: Physical Therapy

## 2021-11-04 DIAGNOSIS — M6281 Muscle weakness (generalized): Secondary | ICD-10-CM | POA: Insufficient documentation

## 2021-11-04 DIAGNOSIS — M25551 Pain in right hip: Secondary | ICD-10-CM | POA: Diagnosis present

## 2021-11-04 DIAGNOSIS — M25552 Pain in left hip: Secondary | ICD-10-CM | POA: Insufficient documentation

## 2021-11-04 DIAGNOSIS — M25562 Pain in left knee: Secondary | ICD-10-CM | POA: Insufficient documentation

## 2021-11-04 DIAGNOSIS — G8929 Other chronic pain: Secondary | ICD-10-CM | POA: Diagnosis present

## 2021-11-04 DIAGNOSIS — M545 Low back pain, unspecified: Secondary | ICD-10-CM | POA: Diagnosis present

## 2021-11-04 DIAGNOSIS — M25511 Pain in right shoulder: Secondary | ICD-10-CM | POA: Insufficient documentation

## 2021-11-04 DIAGNOSIS — M25561 Pain in right knee: Secondary | ICD-10-CM | POA: Diagnosis present

## 2021-11-04 DIAGNOSIS — R262 Difficulty in walking, not elsewhere classified: Secondary | ICD-10-CM | POA: Insufficient documentation

## 2021-11-04 NOTE — Therapy (Signed)
OUTPATIENT PHYSICAL THERAPY TREATMENT NOTE   Patient Name: Noah Lane MRN: [id number] DOB:[birth year], 66 y.o., male Today's Date: 11/04/2021   REFERRING PROVIDER: Rosemarie Ax, MD   END OF SESSION:   PT End of Session - 11/04/21 0834     Visit Number 4    Date for PT Re-Evaluation 12/14/21    Authorization Type Medicare    Progress Note Due on Visit 10    PT Start Time 0830   started early   PT Stop Time 0920    PT Time Calculation (min) 50 min    Activity Tolerance Patient tolerated treatment well    Behavior During Therapy [facility] for tasks assessed/performed               Past Medical History:  Diagnosis Date   Diabetes mellitus type 2 in obese ([place])    Mixed hyperlipidemia    Past Surgical History:  Procedure Laterality Date   HERNIA REPAIR  2012   Patient Active Problem List   Diagnosis Date Noted   Mixed hyperlipidemia 10/20/2021   Strain of lumbar region 10/16/2021   Strain of calf muscle 07/22/2021   Patellofemoral pain syndrome of both knees 02/24/2021   Rotator cuff tendinitis, right 12/28/2019   Hip pain, bilateral 05/28/2019   Chronic bilateral low back pain without sciatica 05/28/2019   Diabetes mellitus type 2 in obese ([place]) 05/28/2019   Erectile dysfunction 05/28/2019   Pain and swelling of left knee 03/12/2019   Lateral epicondylitis, right elbow 03/15/2017    REFERRING DIAG: S39.012A (ICD-10-CM) - Strain of lumbar region, initial encounter    THERAPY DIAG:  Acute bilateral low back pain without sciatica  Difficulty in walking, not elsewhere classified  Muscle weakness (generalized)  Rationale for Evaluation and Treatment Rehabilitation  ONSET DATE: 10/16/2021    PERTINENT HISTORY: fall  PRECAUTIONS: fall, 2x in past 80mo OCCUPATION: real estate broker   PLOF: Independent, Vocation/Vocational requirements: travels for work   PATIENT GOALS to be able to resume plof and avoid falling again  SUBJECTIVE: Less sharp pains  with movement.  The aching follows me throughout the day.  I am now sleeping at least 4 hour stretches but when I have pain I use a heating pad.  PAIN:  Are you having pain? Yes: NPRS scale: 5-6/10 Pain location: pain when moving quickly Pain description: sharp, LBP without radicular symptoms Aggravating factors: quick movements Relieving factors: meds   OBJECTIVE: (objective measures completed at initial evaluation unless otherwise dated)   DIAGNOSTIC FINDINGS:  Five lumbar type vertebra. There is no acute fracture or subluxation of the lumbar spine. There is multilevel degenerative changes with disc space narrowing, endplate irregularity and spurring/osteophyte. Multilevel facet arthropathy. The visualized posterior elements appear intact. There is atherosclerotic calcification of the aorta. The soft tissues are grossly unremarkable.   PATIENT SURVEYS:  FFOTO 67goal is 745  SCREENING FOR RED FLAGS: Bowel or bladder incontinence: No Spinal tumors: No Cauda equina syndrome: No Compression fracture: No Abdominal aneurysm: No   COGNITION:           Overall cognitive status: Within functional limits for tasks assessed                          SENSATION: Diabetic neuropathy that was present prior to falling but no radicular symptoms   MUSCLE LENGTH: Hamstrings: Right 45 deg; Left 50 deg Thomas test: Right pos ; Left pos    POSTURE:  rounded shoulders, increased lumbar lordosis, and anterior pelvic tilt     LUMBAR ROM:    Active  A/PROM  eval  Flexion Fingertips to just past patella  Extension WNL  Right lateral flexion Fingertips to joint line  Left lateral flexion Fingertips to joint line  Right rotation    Left rotation     (Blank rows = not tested)   LOWER EXTREMITY ROM:      WFL   LOWER EXTREMITY MMT:     Generally 4+/5 bilaterally with exception of hip ER and extension 3+/5 bilaterally   LUMBAR SPECIAL TESTS:  Straight leg raise test: Negative and  FABER test: Positive   FUNCTIONAL TESTS:  5 times sit to stand: 13.74 sec Timed up and go (TUG): 8.70   GAIT: Distance walked: 50 Assistive device utilized: None Level of assistance: Complete Independence Comments: Externally rotated LE's bilaterally       TODAY'S TREATMENT  11/04/21: NuStep L5 x 6' PT present to discuss symptoms Resisted backward walking in row with pulleys 20lb x 6 Seated lat pulldown 2x10 55lb Standing palloff press blue band 1x10, stir the pot 1x10 Seated reverse sit up holding orange weighted ball at chest x 15 Cat/cow x 10, prayer stretch 5x5" Supine with strap hamstring and ITB stretch 2x20" bil   10/28/21: NuStep L5 x 6' PT present to monitor and discuss plan for session Supine hamstring stretch with strap 2x30 bil Supine posterior pelvic tilt x 10, then pelvic tilt to bridge x 10 Supine 90/90 march x 10 alt, then add arms for dead bug x 10 (dead bug added to HEP) Seated reverse sit up holding orange weighted ball at chest x 15 Seated lat pulldown 1x10 45lb, 1x10 55lb Resisted backward walking in row with pulleys 15lb x 5, supervision Standing cable pulley row 20lb 1x10, 25lb 1x10 Standing on airex pad march taps to 2nd step alt x 20, no UE support Rockerboard x2' ant/post light bil UE   10/21/21: NuStep L4 x 6' PT present to discuss symptom  Supine hamstring and IT band stretch with strap 2x30" bil Supine posterior pelvic tilt x20 Pelvic tilt to bridge with breath cueing by PT x 10 Supine 90/90 LE toe taps (Pt with midline abdominal bulge so modified to march to 90/90 with leg lowering and return to 90/90), alt LE 1x10 Seated reverse sit up holding orange weighted ball at chest x 15 Seated orange plyo ball diagonals hip to shoulder 2x5 Review of standing quad and supine off edge of table hip flexor stretch from HEP, option to stretch quad in standing using opp hand to back of shoe     PATIENT EDUCATION:  Education details: Initiated HEP Person  educated: Patient Education method: Consulting civil engineer, Media planner, Verbal cues, and Handouts Education comprehension: verbalized understanding, returned demonstration, and verbal cues required     HOME EXERCISE PROGRAM: Access Code: [email or web address] URL: [email or web address] Date: 11/04/2021 Prepared by: Venetia Night Sweet Jarvis  Exercises - Supine Hamstring Stretch with Strap  - 1 x daily - 7 x weekly - 1 sets - 3 reps - 20 hold - Supine ITB Stretch with Strap  - 1 x daily - 7 x weekly - 1 sets - 3 reps - 20 hold - Thomas Stretch on Table  - 1 x daily - 7 x weekly - 1 sets - 3 reps - 20 hold - Supine Posterior Pelvic Tilt  - 1 x daily - 7 x weekly - 3 sets - 10 reps - Standing Quad Stretch with  Table and Chair Support  - 1 x daily - 7 x weekly - 1 sets - 3 reps - 20 hold - Supine Bridge with Spinal Articulation  - 1 x daily - 7 x weekly - 2 sets - 10 reps - Modified Eccentric Sit Up in Backless Chair  - 1 x daily - 7 x weekly - 2 sets - 10 reps - Dead Bug  - 1 x daily - 7 x weekly - 2 sets - 10 reps - Supine Lower Trunk Rotation  - 1 x daily - 7 x weekly - 1 sets - 10 reps - 5 hold - Cat Cow  - 1 x daily - 7 x weekly - 1 sets - 10 reps - 5 hold - Child's Pose Stretch  - 1 x daily - 7 x weekly - 1 sets - 10 reps - 5 hold - Sidelying Thoracic Rotation with Open Book  - 1 x daily - 7 x weekly - 3 sets - 10 reps  ASSESSMENT:   CLINICAL IMPRESSION: Pt reports less sharp pains with movement and is now sleeping at least 4 hour stretches without pain disruption.  He is demonstrating standing therex with improved alignment and core recruitment.  PT added palloff press and stir the pot with blue band today.  Spine mobility and stretching were progressed and well tolerated today to include lower trunk rotation, open book, cat/cow and prayer.  HEP updated to reflect new stretches.       OBJECTIVE IMPAIRMENTS Abnormal gait, decreased activity tolerance, decreased balance, decreased endurance,  decreased mobility, difficulty walking, decreased ROM, decreased strength, decreased safety awareness, increased fascial restrictions, increased muscle spasms, impaired flexibility, obesity, and pain.    ACTIVITY LIMITATIONS carrying, lifting, bending, sitting, standing, squatting, sleeping, stairs, transfers, bed mobility, bathing, dressing, and hygiene/grooming   PARTICIPATION LIMITATIONS: meal prep, cleaning, laundry, driving, shopping, community activity, and yard work   [place] and 1-2 comorbidities: obesity and diabetes  are also affecting patient's functional outcome.    REHAB POTENTIAL: Good   CLINICAL DECISION MAKING: Stable/uncomplicated   EVALUATION COMPLEXITY: Low     GOALS: Goals reviewed with patient? Yes   SHORT TERM GOALS: Target date: 11/16/2021   Pain report to be no greater than 4/10  Baseline: continues to be 5-6/10 Goal status: ongoing   2.  Patient will be independent with initial HEP  Baseline:  Goal status: MET   3.  Patient to be able to sleep 4 consecutive hours without waking with pain Baseline:  Goal status: MET   LONG TERM GOALS: Target date: 12/14/2021   Patient to report pain no greater than 2/10  Baseline:  Goal status: INITIAL   2.  Patient to be independent with advanced HEP  Baseline:  Goal status: INITIAL   3.  Patient to be able to sleep through the night  Baseline:  Goal status: ongoing, awakens with pain and uses heat   4.  Patient to be able to perform all transfers and bed mobility with pain no greater than 2/10 Baseline:  Goal status: INITIAL   5.  TUG  and 5 times sit to stand to improve by 2 seconds Baseline:  Goal status: INITIAL   6.  FOTO to be 70 Baseline:  Goal status: INITIAL     PLAN: PT FREQUENCY: 2x/week   PT DURATION: 8 weeks   PLANNED INTERVENTIONS: Therapeutic exercises, Therapeutic activity, Neuromuscular re-education, Balance training, Gait training, Patient/Family education, Self  Care, Joint mobilization, Stair training,  Aquatic Therapy, Dry Needling, Electrical stimulation, Spinal mobilization, Cryotherapy, Moist heat, Taping, Ultrasound, Ionotophoresis 73m/ml Dexamethasone, Manual therapy, and Re-evaluation.   PLAN FOR NEXT SESSION: Review HEP and follow up on new stretches, progress palloff series and add to HEP, NuStep, progress core strengthening activities, incorporate UE strength into trunk stabilization.       Caira Poche, PT 11/04/21 9:26 AM

## 2021-11-11 ENCOUNTER — Ambulatory Visit: Payer: Medicare Other

## 2021-11-13 ENCOUNTER — Ambulatory Visit: Payer: Medicare Other

## 2021-11-13 DIAGNOSIS — R262 Difficulty in walking, not elsewhere classified: Secondary | ICD-10-CM

## 2021-11-13 DIAGNOSIS — M6281 Muscle weakness (generalized): Secondary | ICD-10-CM

## 2021-11-13 DIAGNOSIS — M545 Low back pain, unspecified: Secondary | ICD-10-CM

## 2021-11-13 NOTE — Therapy (Signed)
OUTPATIENT PHYSICAL THERAPY TREATMENT NOTE   Patient Name: Bolton Canupp MRN: [id number] DOB:Dec 04, 1955, 66 y.o., male Today's Date: 11/13/2021   REFERRING PROVIDER: Rosemarie Ax, MD   END OF SESSION:   PT End of Session - 11/13/21 0931     Visit Number 5    Date for PT Re-Evaluation 12/14/21    Authorization Type Medicare    Progress Note Due on Visit 10    PT Start Time 0849    PT Stop Time 0933    PT Time Calculation (min) 44 min    Activity Tolerance Patient tolerated treatment well    Behavior During Therapy [facility] for tasks assessed/performed                Past Medical History:  Diagnosis Date   Diabetes mellitus type 2 in obese ([place])    Mixed hyperlipidemia    Past Surgical History:  Procedure Laterality Date   HERNIA REPAIR  2012   Patient Active Problem List   Diagnosis Date Noted   Mixed hyperlipidemia 10/20/2021   Strain of lumbar region 10/16/2021   Strain of calf muscle 07/22/2021   Patellofemoral pain syndrome of both knees 02/24/2021   Rotator cuff tendinitis, right 12/28/2019   Hip pain, bilateral 05/28/2019   Chronic bilateral low back pain without sciatica 05/28/2019   Diabetes mellitus type 2 in obese ([place]) 05/28/2019   Erectile dysfunction 05/28/2019   Pain and swelling of left knee 03/12/2019   Lateral epicondylitis, right elbow 03/15/2017    REFERRING DIAG: S39.012A (ICD-10-CM) - Strain of lumbar region, initial encounter    THERAPY DIAG:  Acute bilateral low back pain without sciatica  Difficulty in walking, not elsewhere classified  Muscle weakness (generalized)  Rationale for Evaluation and Treatment Rehabilitation  ONSET DATE: 10/16/2021    PERTINENT HISTORY: fall  PRECAUTIONS: fall, 2x in past 42mo OCCUPATION: real estate broker   PLOF: Independent, Vocation/Vocational requirements: travels for work   PATIENT GOALS to be able to resume plof and avoid falling again  SUBJECTIVE: "I'm actually feeling better.   I'm up to 1/4 mile walking"    PAIN:  Are you having pain? Yes: NPRS scale: 5-6/10 Pain location: pain when moving quickly Pain description: sharp, LBP without radicular symptoms Aggravating factors: quick movements Relieving factors: meds   OBJECTIVE: (objective measures completed at initial evaluation unless otherwise dated)   DIAGNOSTIC FINDINGS:  Five lumbar type vertebra. There is no acute fracture or subluxation of the lumbar spine. There is multilevel degenerative changes with disc space narrowing, endplate irregularity and spurring/osteophyte. Multilevel facet arthropathy. The visualized posterior elements appear intact. There is atherosclerotic calcification of the aorta. The soft tissues are grossly unremarkable.   PATIENT SURVEYS:  FOTO 432goal is 759  SCREENING FOR RED FLAGS: Bowel or bladder incontinence: No Spinal tumors: No Cauda equina syndrome: No Compression fracture: No Abdominal aneurysm: No   COGNITION:           Overall cognitive status: Within functional limits for tasks assessed                          SENSATION: Diabetic neuropathy that was present prior to falling but no radicular symptoms   MUSCLE LENGTH: Hamstrings: Right 45 deg; Left 50 deg Thomas test: Right pos ; Left pos    POSTURE: rounded shoulders, increased lumbar lordosis, and anterior pelvic tilt     LUMBAR ROM:    Active  A/PROM  eval  Flexion Fingertips to just past patella  Extension WNL  Right lateral flexion Fingertips to joint line  Left lateral flexion Fingertips to joint line  Right rotation    Left rotation     (Blank rows = not tested)   LOWER EXTREMITY ROM:      WFL   LOWER EXTREMITY MMT:     Generally 4+/5 bilaterally with exception of hip ER and extension 3+/5 bilaterally   LUMBAR SPECIAL TESTS:  Straight leg raise test: Negative and FABER test: Positive   FUNCTIONAL TESTS:  5 times sit to stand: 13.74 sec Timed up and go (TUG): 8.70    GAIT: Distance walked: 50 Assistive device utilized: None Level of assistance: Complete Independence Comments: Externally rotated LE's bilaterally       TODAY'S TREATMENT  11/13/21: Declined NuStep "I'm pretty warm from walking before I got here" Resisted backward walking in row with pulleys 20lb x 6 Seated lat pulldown 2x10 55lb Standing palloff press blue band 2x10, stir the pot 2x10 Supine dying bug x 20 Cat/cow x 10, prayer stretch 5x5" Supine with strap hamstring and ITB stretch 2x20" bil Seated reverse sit up holding orange weighted ball at chest x 15  11/04/21: NuStep L5 x 6' PT present to discuss symptoms Resisted backward walking in row with pulleys 20lb x 10 Seated lat pulldown 2x10 55lb Standing palloff press blue band 1x10, stir the pot 1x10 Seated reverse sit up holding orange weighted ball at chest x 15 Cat/cow x 10, prayer stretch 5x5" Supine with strap hamstring and ITB stretch 2x20" bil   10/28/21: NuStep L5 x 6' PT present to monitor and discuss plan for session Supine hamstring stretch with strap 2x30 bil Supine posterior pelvic tilt x 10, then pelvic tilt to bridge x 10 Supine 90/90 march x 10 alt, then add arms for dead bug x 10 (dead bug added to HEP) Seated reverse sit up holding orange weighted ball at chest x 15 Seated lat pulldown 1x10 45lb, 1x10 55lb Resisted backward walking in row with pulleys 15lb x 5, supervision Standing cable pulley row 20lb 1x10, 25lb 1x10 Standing on airex pad march taps to 2nd step alt x 20, no UE support Rockerboard x2' ant/post light bil UE    PATIENT EDUCATION:  Education details: Initiated HEP Person educated: Patient Education method: Consulting civil engineer, Media planner, Verbal cues, and Handouts Education comprehension: verbalized understanding, returned demonstration, and verbal cues required     HOME EXERCISE PROGRAM: Access Code: [email or web address] URL: [email or web address] Date: 11/04/2021 Prepared by:  Venetia Night Beuhring  Exercises - Supine Hamstring Stretch with Strap  - 1 x daily - 7 x weekly - 1 sets - 3 reps - 20 hold - Supine ITB Stretch with Strap  - 1 x daily - 7 x weekly - 1 sets - 3 reps - 20 hold - Thomas Stretch on Table  - 1 x daily - 7 x weekly - 1 sets - 3 reps - 20 hold - Supine Posterior Pelvic Tilt  - 1 x daily - 7 x weekly - 3 sets - 10 reps - Standing Quad Stretch with Table and Chair Support  - 1 x daily - 7 x weekly - 1 sets - 3 reps - 20 hold - Supine Bridge with Spinal Articulation  - 1 x daily - 7 x weekly - 2 sets - 10 reps - Modified Eccentric Sit Up in Backless Chair  - 1 x daily - 7 x weekly - 2 sets - 10 reps -  Dead Bug  - 1 x daily - 7 x weekly - 2 sets - 10 reps - Supine Lower Trunk Rotation  - 1 x daily - 7 x weekly - 1 sets - 10 reps - 5 hold - Cat Cow  - 1 x daily - 7 x weekly - 1 sets - 10 reps - 5 hold - Child's Pose Stretch  - 1 x daily - 7 x weekly - 1 sets - 10 reps - 5 hold - Sidelying Thoracic Rotation with Open Book  - 1 x daily - 7 x weekly - 3 sets - 10 reps  ASSESSMENT:   CLINICAL IMPRESSION: Derrien is progressing appropriately.  He is having less pain and we were able to progress his core work today.  He is quite talkative and needs to be redirected frequently.  He would respond well to continued skilled PT for core stabilization and LE flexibility.        OBJECTIVE IMPAIRMENTS Abnormal gait, decreased activity tolerance, decreased balance, decreased endurance, decreased mobility, difficulty walking, decreased ROM, decreased strength, decreased safety awareness, increased fascial restrictions, increased muscle spasms, impaired flexibility, obesity, and pain.    ACTIVITY LIMITATIONS carrying, lifting, bending, sitting, standing, squatting, sleeping, stairs, transfers, bed mobility, bathing, dressing, and hygiene/grooming   PARTICIPATION LIMITATIONS: meal prep, cleaning, laundry, driving, shopping, community activity, and yard work   [place] and 1-2 comorbidities: obesity and diabetes  are also affecting patient's functional outcome.    REHAB POTENTIAL: Good   CLINICAL DECISION MAKING: Stable/uncomplicated   EVALUATION COMPLEXITY: Low     GOALS: Goals reviewed with patient? Yes   SHORT TERM GOALS: Target date: 11/16/2021   Pain report to be no greater than 4/10  Baseline: continues to be 5-6/10 Goal status: ongoing   2.  Patient will be independent with initial HEP  Baseline:  Goal status: MET   3.  Patient to be able to sleep 4 consecutive hours without waking with pain Baseline:  Goal status: MET   LONG TERM GOALS: Target date: 12/14/2021   Patient to report pain no greater than 2/10  Baseline:  Goal status: INITIAL   2.  Patient to be independent with advanced HEP  Baseline:  Goal status: INITIAL   3.  Patient to be able to sleep through the night  Baseline:  Goal status: ongoing, awakens with pain and uses heat   4.  Patient to be able to perform all transfers and bed mobility with pain no greater than 2/10 Baseline:  Goal status: INITIAL   5.  TUG  and 5 times sit to stand to improve by 2 seconds Baseline:  Goal status: INITIAL   6.  FOTO to be 70 Baseline:  Goal status: INITIAL     PLAN: PT FREQUENCY: 2x/week   PT DURATION: 8 weeks   PLANNED INTERVENTIONS: Therapeutic exercises, Therapeutic activity, Neuromuscular re-education, Balance training, Gait training, Patient/Family education, Self Care, Joint mobilization, Stair training, Aquatic Therapy, Dry Needling, Electrical stimulation, Spinal mobilization, Cryotherapy, Moist heat, Taping, Ultrasound, Ionotophoresis 40m/ml Dexamethasone, Manual therapy, and Re-evaluation.   PLAN FOR NEXT SESSION: Review HEP and follow up on new stretches, progress palloff series and add to HEP, NuStep, progress core strengthening activities, incorporate UE strength into trunk stabilization.       JAnderson MaltaB. Yarima Penman, PT 11/13/21 11:08  AM

## 2021-11-19 ENCOUNTER — Ambulatory Visit: Payer: Medicare Other | Admitting: Rehabilitative and Restorative Service Providers"

## 2021-11-20 ENCOUNTER — Encounter: Payer: Self-pay | Admitting: Family Medicine

## 2021-11-20 ENCOUNTER — Ambulatory Visit (INDEPENDENT_AMBULATORY_CARE_PROVIDER_SITE_OTHER): Payer: Medicare Other | Admitting: Family Medicine

## 2021-11-20 DIAGNOSIS — M7581 Other shoulder lesions, right shoulder: Secondary | ICD-10-CM | POA: Diagnosis present

## 2021-11-20 NOTE — Progress Notes (Signed)
  Noah Lane - 66 y.o. male MRN [id number]  Date of birth: September 07, 1955  SUBJECTIVE:  Including CC & ROS.  No chief complaint on file.   Noah Lane is a 66 y.o. male that is following up for his right shoulder pain. Has noticed some improvement of his pain. Has done well in physical therapy.    Review of Systems See HPI   HISTORY: Past Medical, Surgical, Social, and Family History Reviewed & Updated per EMR.   Pertinent Historical Findings include:  Past Medical History:  Diagnosis Date   Diabetes mellitus type 2 in obese ([place])    Mixed hyperlipidemia     Past Surgical History:  Procedure Laterality Date   HERNIA REPAIR  2012     PHYSICAL EXAM:  VS: BP (!) 150/77 (BP Location: Right Arm, Patient Position: Sitting)   Ht '6\' 1"'$  (1.854 m)   Wt 245 lb (111.1 kg)   BMI 32.32 kg/m  Physical Exam Gen: NAD, alert, cooperative with exam, well-appearing MSK:  Neurovascularly intact       ASSESSMENT & PLAN:   Rotator cuff tendinitis, right Having ongoing pain. Has noticed some improvement.  - counseled on home exercise therapy  - pursue zilretta injection.

## 2021-11-20 NOTE — Assessment & Plan Note (Signed)
Having ongoing pain. Has noticed some improvement.  - counseled on home exercise therapy  - pursue zilretta injection.

## 2021-11-23 ENCOUNTER — Other Ambulatory Visit: Payer: Self-pay | Admitting: Family Medicine

## 2021-11-23 ENCOUNTER — Encounter: Payer: Self-pay | Admitting: Family Medicine

## 2021-11-23 DIAGNOSIS — G4733 Obstructive sleep apnea (adult) (pediatric): Secondary | ICD-10-CM

## 2021-11-24 ENCOUNTER — Encounter: Payer: Self-pay | Admitting: Rehabilitative and Restorative Service Providers"

## 2021-11-24 ENCOUNTER — Ambulatory Visit: Payer: Medicare Other | Admitting: Rehabilitative and Restorative Service Providers"

## 2021-11-24 ENCOUNTER — Ambulatory Visit (HOSPITAL_BASED_OUTPATIENT_CLINIC_OR_DEPARTMENT_OTHER): Payer: Medicare Other | Admitting: Physical Therapy

## 2021-11-24 DIAGNOSIS — R262 Difficulty in walking, not elsewhere classified: Secondary | ICD-10-CM

## 2021-11-24 DIAGNOSIS — M545 Low back pain, unspecified: Secondary | ICD-10-CM

## 2021-11-24 DIAGNOSIS — M6281 Muscle weakness (generalized): Secondary | ICD-10-CM

## 2021-11-24 NOTE — Therapy (Signed)
OUTPATIENT PHYSICAL THERAPY TREATMENT NOTE   Patient Name: Noah Lane MRN: [id number] DOB:1955-03-05, 66 y.o., male Today's Date: 11/24/2021   REFERRING PROVIDER: Myra Rude, MD   END OF SESSION:   PT End of Session - 11/24/21 0747     Visit Number 6    Date for PT Re-Evaluation 12/14/21    Authorization Type Medicare:  KX Modifier Needed    Progress Note Due on Visit 10    PT Start Time 0743    PT Stop Time 0825    PT Time Calculation (min) 42 min    Activity Tolerance Patient tolerated treatment well    Behavior During Therapy [facility] for tasks assessed/performed                Past Medical History:  Diagnosis Date   Diabetes mellitus type 2 in obese ([facility])    Mixed hyperlipidemia    Past Surgical History:  Procedure Laterality Date   HERNIA REPAIR  2012   Patient Active Problem List   Diagnosis Date Noted   Mixed hyperlipidemia 10/20/2021   Strain of lumbar region 10/16/2021   Strain of calf muscle 07/22/2021   Patellofemoral pain syndrome of both knees 02/24/2021   Rotator cuff tendinitis, right 12/28/2019   Hip pain, bilateral 05/28/2019   Chronic bilateral low back pain without sciatica 05/28/2019   Diabetes mellitus type 2 in obese ([facility]) 05/28/2019   Erectile dysfunction 05/28/2019   Pain and swelling of left knee 03/12/2019   Lateral epicondylitis, right elbow 03/15/2017    REFERRING DIAG: S39.012A (ICD-10-CM) - Strain of lumbar region, initial encounter    THERAPY DIAG:  Acute bilateral low back pain without sciatica  Difficulty in walking, not elsewhere classified  Muscle weakness (generalized)  Rationale for Evaluation and Treatment Rehabilitation  ONSET DATE: 10/16/2021    PERTINENT HISTORY: fall  PRECAUTIONS: fall, 2x in past 17mo  OCCUPATION: real estate broker   PLOF: Independent, Vocation/Vocational requirements: travels for work   PATIENT GOALS to be able to resume plof and avoid falling again  SUBJECTIVE: Pt  reports using a heating pad, so he is having decreased pain this morning.  PAIN:  Are you having pain? Yes: NPRS scale: 5/10 Pain location: pain when moving quickly Pain description: sharp, LBP without radicular symptoms Aggravating factors: quick movements Relieving factors: meds   OBJECTIVE: (objective measures completed at initial evaluation unless otherwise dated)   DIAGNOSTIC FINDINGS:  Lumbar Radiograph on 10/16/2021: Five lumbar type vertebra. There is no acute fracture or subluxation of the lumbar spine. There is multilevel degenerative changes with disc space narrowing, endplate irregularity and spurring/osteophyte. Multilevel facet arthropathy. The visualized posterior elements appear intact. There is atherosclerotic calcification of the aorta. The soft tissues are grossly unremarkable.   PATIENT SURVEYS:  Eval:  FOTO 44 goal is 21   SCREENING FOR RED FLAGS: Bowel or bladder incontinence: No Spinal tumors: No Cauda equina syndrome: No Compression fracture: No Abdominal aneurysm: No   COGNITION:           Overall cognitive status: Within functional limits for tasks assessed                          SENSATION: Diabetic neuropathy that was present prior to falling but no radicular symptoms   MUSCLE LENGTH: Hamstrings: Right 45 deg; Left 50 deg Thomas test: Right pos ; Left pos    POSTURE: rounded shoulders, increased lumbar lordosis, and anterior pelvic tilt  LUMBAR ROM:    Active  A/PROM  eval  Flexion Fingertips to just past patella  Extension WNL  Right lateral flexion Fingertips to joint line  Left lateral flexion Fingertips to joint line  Right rotation    Left rotation     (Blank rows = not tested)   LOWER EXTREMITY ROM:      WFL   LOWER EXTREMITY MMT:     Eval:  Generally 4+/5 bilaterally with exception of hip ER and extension 3+/5 bilaterally   LUMBAR SPECIAL TESTS:  Eval:  Straight leg raise test: Negative and FABER test: Positive    FUNCTIONAL TESTS:  Eval: 5 times sit to stand: 13.74 sec Timed up and go (TUG): 8.70   GAIT: Distance walked: 50 Assistive device utilized: None Level of assistance: Complete Independence Comments: Externally rotated LE's bilaterally       TODAY'S TREATMENT:  11/24/2021: NuStep L6 x 7' with PT present to discuss symptoms Resisted backwards gait with 20# cable pulley x10 (cuing for maintaining core stability) Seated lat pulldown 2x10 55lb Rows 45# 2x10 Hamstring stretch on Power Plate on stretch setting 30 Hz 2x30 sec bilat each Seated reverse crunches and obliques reverse crunches holding orange weighted ball at chest x10 each Quadruped cat/cow x10 Prayer stretch 5x5 sec Standing palloff press blue band 2x10, stir the pot 2x10   11/13/21: Declined NuStep "I'm pretty warm from walking before I got here" Resisted backward walking in row with pulleys 20lb x 6 Seated lat pulldown 2x10 55lb Standing palloff press blue band 2x10, stir the pot 2x10 Supine dying bug x 20 Cat/cow x 10, prayer stretch 5x5" Supine with strap hamstring and ITB stretch 2x20" bil Seated reverse sit up holding orange weighted ball at chest x 15  11/04/21: NuStep L5 x 6' PT present to discuss symptoms Resisted backward walking in row with pulleys 20lb x 10 Seated lat pulldown 2x10 55lb Standing palloff press blue band 1x10, stir the pot 1x10 Seated reverse sit up holding orange weighted ball at chest x 15 Cat/cow x 10, prayer stretch 5x5" Supine with strap hamstring and ITB stretch 2x20" bil     PATIENT EDUCATION:  Education details: Initiated HEP Person educated: Patient Education method: Consulting civil engineer, Media planner, Verbal cues, and Handouts Education comprehension: verbalized understanding, returned demonstration, and verbal cues required     HOME EXERCISE PROGRAM: Access Code: [email or web address] URL: [email or web address] Date: 11/04/2021 Prepared by: Venetia Night Beuhring  Exercises -  Supine Hamstring Stretch with Strap  - 1 x daily - 7 x weekly - 1 sets - 3 reps - 20 hold - Supine ITB Stretch with Strap  - 1 x daily - 7 x weekly - 1 sets - 3 reps - 20 hold - Thomas Stretch on Table  - 1 x daily - 7 x weekly - 1 sets - 3 reps - 20 hold - Supine Posterior Pelvic Tilt  - 1 x daily - 7 x weekly - 3 sets - 10 reps - Standing Quad Stretch with Table and Chair Support  - 1 x daily - 7 x weekly - 1 sets - 3 reps - 20 hold - Supine Bridge with Spinal Articulation  - 1 x daily - 7 x weekly - 2 sets - 10 reps - Modified Eccentric Sit Up in Backless Chair  - 1 x daily - 7 x weekly - 2 sets - 10 reps - Dead Bug  - 1 x daily - 7 x weekly - 2 sets - 10 reps - Supine  Lower Trunk Rotation  - 1 x daily - 7 x weekly - 1 sets - 10 reps - 5 hold - Cat Cow  - 1 x daily - 7 x weekly - 1 sets - 10 reps - 5 hold - Child's Pose Stretch  - 1 x daily - 7 x weekly - 1 sets - 10 reps - 5 hold - Sidelying Thoracic Rotation with Open Book  - 1 x daily - 7 x weekly - 3 sets - 10 reps  ASSESSMENT:   CLINICAL IMPRESSION: Mr Agostino presents to skilled PT reporting some decreased in pain secondary to using a heating pad.  Pt followed up with Dr Raeford Razor about his right shoulder and he may be getting a new injection in his shoulder to assist with pain.  Pt tolerated exercises well and without any reports of increased pain.  Only required min cuing for technique during session.  Pt continues to require skilled PT to progress towards goal related activities.       OBJECTIVE IMPAIRMENTS Abnormal gait, decreased activity tolerance, decreased balance, decreased endurance, decreased mobility, difficulty walking, decreased ROM, decreased strength, decreased safety awareness, increased fascial restrictions, increased muscle spasms, impaired flexibility, obesity, and pain.    ACTIVITY LIMITATIONS carrying, lifting, bending, sitting, standing, squatting, sleeping, stairs, transfers, bed mobility, bathing, dressing, and  hygiene/grooming   PARTICIPATION LIMITATIONS: meal prep, cleaning, laundry, driving, shopping, community activity, and yard work   [place] and 1-2 comorbidities: obesity and diabetes  are also affecting patient's functional outcome.    REHAB POTENTIAL: Good   CLINICAL DECISION MAKING: Stable/uncomplicated   EVALUATION COMPLEXITY: Low     GOALS: Goals reviewed with patient? Yes   SHORT TERM GOALS: Target date: 11/16/2021   Pain report to be no greater than 4/10  Baseline: continues to be 5-6/10 Goal status: ongoing   2.  Patient will be independent with initial HEP  Baseline:  Goal status: MET   3.  Patient to be able to sleep 4 consecutive hours without waking with pain Baseline:  Goal status: MET   LONG TERM GOALS: Target date: 12/14/2021   Patient to report pain no greater than 2/10  Baseline:  Goal status: INITIAL   2.  Patient to be independent with advanced HEP  Baseline:  Goal status: ONGOING   3.  Patient to be able to sleep through the night  Baseline:  Goal status: ongoing, awakens with pain and uses heat   4.  Patient to be able to perform all transfers and bed mobility with pain no greater than 2/10 Baseline:  Goal status: INITIAL   5.  TUG  and 5 times sit to stand to improve by 2 seconds Baseline:  Goal status: INITIAL   6.  FOTO to be 70 Baseline:  Goal status: INITIAL     PLAN: PT FREQUENCY: 2x/week   PT DURATION: 8 weeks   PLANNED INTERVENTIONS: Therapeutic exercises, Therapeutic activity, Neuromuscular re-education, Balance training, Gait training, Patient/Family education, Self Care, Joint mobilization, Stair training, Aquatic Therapy, Dry Needling, Electrical stimulation, Spinal mobilization, Cryotherapy, Moist heat, Taping, Ultrasound, Ionotophoresis 4mg /ml Dexamethasone, Manual therapy, and Re-evaluation.   PLAN FOR NEXT SESSION: Review HEP and follow up on new stretches, progress palloff series and add to HEP,  NuStep, progress core strengthening activities, incorporate UE strength into trunk stabilization.       Juel Burrow, PT 11/24/21 8:37 AM  [facility] [address], [place], [place] [address] Phone # [phone] Fax  [phone]  

## 2021-11-25 ENCOUNTER — Ambulatory Visit (INDEPENDENT_AMBULATORY_CARE_PROVIDER_SITE_OTHER): Payer: Medicare Other | Admitting: Family Medicine

## 2021-11-25 ENCOUNTER — Ambulatory Visit: Payer: Self-pay

## 2021-11-25 ENCOUNTER — Encounter: Payer: Self-pay | Admitting: Family Medicine

## 2021-11-25 VITALS — BP 131/70 | Ht 73.0 in | Wt 245.0 lb

## 2021-11-25 DIAGNOSIS — M7581 Other shoulder lesions, right shoulder: Secondary | ICD-10-CM

## 2021-11-25 MED ORDER — TRIAMCINOLONE ACETONIDE 32 MG IX SRER
32.0000 mg | Freq: Once | INTRA_ARTICULAR | Status: AC
  Administered 2021-11-25: 32 mg via INTRA_ARTICULAR

## 2021-11-25 NOTE — Progress Notes (Signed)
  Noah Lane - 66 y.o. male MRN [id number]  Date of birth: [birth year]  SUBJECTIVE:  Including CC & ROS.  No chief complaint on file.   Colburn Asper is a 66 y.o. male that is here for a shoulder injection.   Review of Systems See HPI   HISTORY: Past Medical, Surgical, Social, and Family History Reviewed & Updated per EMR.   Pertinent Historical Findings include:  Past Medical History:  Diagnosis Date   Diabetes mellitus type 2 in obese ([place])    Mixed hyperlipidemia     Past Surgical History:  Procedure Laterality Date   HERNIA REPAIR  2012     PHYSICAL EXAM:  VS: BP 131/70 (BP Location: Right Arm, Patient Position: Sitting)   Ht '6\' 1"'$  (1.854 m)   Wt 245 lb (111.1 kg)   BMI 32.32 kg/m  Physical Exam Gen: NAD, alert, cooperative with exam, well-appearing MSK:  Neurovascularly intact     Aspiration/Injection Procedure Note Baylee Mccorkel Apr 29, 1955  Procedure: Injection Indications: right shoulder pain   Procedure Details Consent: Risks of procedure as well as the alternatives and risks of each were explained to the (patient/caregiver).  Consent for procedure obtained. Time Out: Verified patient identification, verified procedure, site/side was marked, verified correct patient position, special equipment/implants available, medications/allergies/relevent history reviewed, required imaging and test results available.  Performed.  The area was cleaned with iodine and alcohol swabs.    The right subacromial space  was injected using 3 cc of 1% lidocaine on a 25-gauge 1-1/2 inch needle.  The syringe was switched and a mixture containing 5 cc's of 32 mg Zilretta and 4 cc's of 0.25% bupivacaine was injected.  Ultrasound was used. Images were obtained in long views showing the injection.    A sterile dressing was applied.  Patient did tolerate procedure well.       ASSESSMENT & PLAN:   Rotator cuff tendinitis, right Completed a Zilretta injection

## 2021-11-25 NOTE — Assessment & Plan Note (Signed)
Completed a Zilretta injection

## 2021-11-25 NOTE — Patient Instructions (Signed)
Good to see you Please use ice   Please send me a message in MyChart with any questions or updates.  Please see me back as scheduled.   --Dr. Raeford Razor

## 2021-11-26 ENCOUNTER — Ambulatory Visit: Payer: Medicare Other | Admitting: Rehabilitative and Restorative Service Providers"

## 2021-11-27 ENCOUNTER — Ambulatory Visit: Payer: Medicare Other

## 2021-11-27 DIAGNOSIS — G8929 Other chronic pain: Secondary | ICD-10-CM

## 2021-11-27 DIAGNOSIS — M545 Low back pain, unspecified: Secondary | ICD-10-CM | POA: Diagnosis not present

## 2021-11-27 DIAGNOSIS — M25551 Pain in right hip: Secondary | ICD-10-CM

## 2021-11-27 DIAGNOSIS — R262 Difficulty in walking, not elsewhere classified: Secondary | ICD-10-CM

## 2021-11-27 DIAGNOSIS — M25552 Pain in left hip: Secondary | ICD-10-CM

## 2021-11-27 DIAGNOSIS — M6281 Muscle weakness (generalized): Secondary | ICD-10-CM

## 2021-11-27 NOTE — Therapy (Signed)
OUTPATIENT PHYSICAL THERAPY TREATMENT NOTE   Patient Name: Noah Lane MRN: [id number] DOB:1955/07/20, 66 y.o., male Today's Date: 11/27/2021   REFERRING PROVIDER: Rosemarie Ax, MD   END OF SESSION:   PT End of Session - 11/27/21 0814     Visit Number 7    Date for PT Re-Evaluation 12/14/21    Authorization Type Medicare:  KX Modifier Needed    PT Start Time 0808    PT Stop Time 0853    PT Time Calculation (min) 45 min    Activity Tolerance Patient tolerated treatment well    Behavior During Therapy [facility] for tasks assessed/performed                Past Medical History:  Diagnosis Date   Diabetes mellitus type 2 in obese ([place])    Mixed hyperlipidemia    Past Surgical History:  Procedure Laterality Date   HERNIA REPAIR  2012   Patient Active Problem List   Diagnosis Date Noted   Mixed hyperlipidemia 10/20/2021   Strain of lumbar region 10/16/2021   Strain of calf muscle 07/22/2021   Patellofemoral pain syndrome of both knees 02/24/2021   Rotator cuff tendinitis, right 12/28/2019   Hip pain, bilateral 05/28/2019   Chronic bilateral low back pain without sciatica 05/28/2019   Diabetes mellitus type 2 in obese ([place]) 05/28/2019   Erectile dysfunction 05/28/2019   Pain and swelling of left knee 03/12/2019   Lateral epicondylitis, right elbow 03/15/2017    REFERRING DIAG: S39.012A (ICD-10-CM) - Strain of lumbar region, initial encounter    THERAPY DIAG:  Acute bilateral low back pain without sciatica  Difficulty in walking, not elsewhere classified  Muscle weakness (generalized)  Chronic pain of left knee  Chronic pain of right knee  Pain in right hip  Pain in left hip  Chronic right shoulder pain  Rationale for Evaluation and Treatment Rehabilitation  ONSET DATE: 10/16/2021    PERTINENT HISTORY: fall  PRECAUTIONS: fall, 2x in past 62mo OCCUPATION: real estate broker   PLOF: Independent, Vocation/Vocational requirements: travels for  work   PATIENT GOALS to be able to resume plof and avoid falling again  SUBJECTIVE: Pt reports no significant changes or increase in pain.    PAIN:  Are you having pain? Yes: NPRS scale: 5/10 Pain location: pain when moving quickly Pain description: sharp, LBP without radicular symptoms Aggravating factors: quick movements Relieving factors: meds   OBJECTIVE: (objective measures completed at initial evaluation unless otherwise dated)   DIAGNOSTIC FINDINGS:  Lumbar Radiograph on 10/16/2021: Five lumbar type vertebra. There is no acute fracture or subluxation of the lumbar spine. There is multilevel degenerative changes with disc space narrowing, endplate irregularity and spurring/osteophyte. Multilevel facet arthropathy. The visualized posterior elements appear intact. There is atherosclerotic calcification of the aorta. The soft tissues are grossly unremarkable.   PATIENT SURVEYS:  Eval:  FFOTO 50goal is 734  SCREENING FOR RED FLAGS: Bowel or bladder incontinence: No Spinal tumors: No Cauda equina syndrome: No Compression fracture: No Abdominal aneurysm: No   COGNITION:           Overall cognitive status: Within functional limits for tasks assessed                          SENSATION: Diabetic neuropathy that was present prior to falling but no radicular symptoms   MUSCLE LENGTH: Hamstrings: Right 45 deg; Left 50 deg Thomas test: Right pos ; Left pos  POSTURE: rounded shoulders, increased lumbar lordosis, and anterior pelvic tilt     LUMBAR ROM:    Active  A/PROM  eval  Flexion Fingertips to just past patella  Extension WNL  Right lateral flexion Fingertips to joint line  Left lateral flexion Fingertips to joint line  Right rotation    Left rotation     (Blank rows = not tested)   LOWER EXTREMITY ROM:      WFL   LOWER EXTREMITY MMT:     Eval:  Generally 4+/5 bilaterally with exception of hip ER and extension 3+/5 bilaterally   LUMBAR SPECIAL TESTS:   Eval:  Straight leg raise test: Negative and FABER test: Positive   FUNCTIONAL TESTS:  Eval: 5 times sit to stand: 13.74 sec Timed up and go (TUG): 8.70   GAIT: Distance walked: 50 Assistive device utilized: None Level of assistance: Complete Independence Comments: Externally rotated LE's bilaterally       TODAY'S TREATMENT:  11/27/2021: NuStep L6 x 7' with PT present to discuss symptoms (refused- "I'm already warm") Right IT band and right hip stretch  10 hold 10 sec PPT x 20 PPT with 90/90 heel tap PPT with 90/90 heel tap with alternating arms and legs PPT with dying bug x 20 PPT with SLR to shoulder flexion/extension with red physio ball x 20 each leg  PPT with dying bug with red physioball PPT with physio ball pass 2 x 10 Seated hollow hold with blue plyo ball hip to hip x 20  11/24/2021: NuStep L6 x 7' with PT present to discuss symptoms Resisted backwards gait with 20# cable pulley x10 (cuing for maintaining core stability) Seated lat pulldown 2x10 55lb Rows 45# 2x10 Hamstring stretch on Power Plate on stretch setting 30 Hz 2x30 sec bilat each Seated reverse crunches and obliques reverse crunches holding orange weighted ball at chest x10 each Quadruped cat/cow x10 Prayer stretch 5x5 sec Standing palloff press blue band 2x10, stir the pot 2x10   11/13/21: Declined NuStep "I'm pretty warm from walking before I got here" Resisted backward walking in row with pulleys 20lb x 6 Seated lat pulldown 2x10 55lb Standing palloff press blue band 2x10, stir the pot 2x10 Supine dying bug x 20 Cat/cow x 10, prayer stretch 5x5" Supine with strap hamstring and ITB stretch 2x20" bil Seated reverse sit up holding orange weighted ball at chest x 15    PATIENT EDUCATION:  Education details: Initiated HEP Person educated: Patient Education method: Consulting civil engineer, Media planner, Verbal cues, and Handouts Education comprehension: verbalized understanding, returned demonstration, and  verbal cues required     HOME EXERCISE PROGRAM: Access Code: [email or web address] URL: [email or web address] Date: 11/04/2021 Prepared by: Venetia Night Beuhring  Exercises - Supine Hamstring Stretch with Strap  - 1 x daily - 7 x weekly - 1 sets - 3 reps - 20 hold - Supine ITB Stretch with Strap  - 1 x daily - 7 x weekly - 1 sets - 3 reps - 20 hold - Thomas Stretch on Table  - 1 x daily - 7 x weekly - 1 sets - 3 reps - 20 hold - Supine Posterior Pelvic Tilt  - 1 x daily - 7 x weekly - 3 sets - 10 reps - Standing Quad Stretch with Table and Chair Support  - 1 x daily - 7 x weekly - 1 sets - 3 reps - 20 hold - Supine Bridge with Spinal Articulation  - 1 x daily - 7 x weekly - 2 sets - 10  reps - Modified Eccentric Sit Up in Backless Chair  - 1 x daily - 7 x weekly - 2 sets - 10 reps - Dead Bug  - 1 x daily - 7 x weekly - 2 sets - 10 reps - Supine Lower Trunk Rotation  - 1 x daily - 7 x weekly - 1 sets - 10 reps - 5 hold - Cat Cow  - 1 x daily - 7 x weekly - 1 sets - 10 reps - 5 hold - Child's Pose Stretch  - 1 x daily - 7 x weekly - 1 sets - 10 reps - 5 hold - Sidelying Thoracic Rotation with Open Book  - 1 x daily - 7 x weekly - 3 sets - 10 reps  ASSESSMENT:   CLINICAL IMPRESSION: Noah Lane is progressing appropriately and was able to tolerate higher level core work today.  He did need heavy verbal cues for correct technique but had no increased pain.   Pt continues to require skilled PT to progress towards goal related activities.       OBJECTIVE IMPAIRMENTS Abnormal gait, decreased activity tolerance, decreased balance, decreased endurance, decreased mobility, difficulty walking, decreased ROM, decreased strength, decreased safety awareness, increased fascial restrictions, increased muscle spasms, impaired flexibility, obesity, and pain.    ACTIVITY LIMITATIONS carrying, lifting, bending, sitting, standing, squatting, sleeping, stairs, transfers, bed mobility, bathing, dressing, and  hygiene/grooming   PARTICIPATION LIMITATIONS: meal prep, cleaning, laundry, driving, shopping, community activity, and yard work   [place] and 1-2 comorbidities: obesity and diabetes  are also affecting patient's functional outcome.    REHAB POTENTIAL: Good   CLINICAL DECISION MAKING: Stable/uncomplicated   EVALUATION COMPLEXITY: Low     GOALS: Goals reviewed with patient? Yes   SHORT TERM GOALS: Target date: 11/16/2021   Pain report to be no greater than 4/10  Baseline: continues to be 5-6/10 Goal status: ongoing   2.  Patient will be independent with initial HEP  Baseline:  Goal status: MET   3.  Patient to be able to sleep 4 consecutive hours without waking with pain Baseline:  Goal status: MET   LONG TERM GOALS: Target date: 12/14/2021   Patient to report pain no greater than 2/10  Baseline:  Goal status: INITIAL   2.  Patient to be independent with advanced HEP  Baseline:  Goal status: ONGOING   3.  Patient to be able to sleep through the night  Baseline:  Goal status: ongoing, awakens with pain and uses heat   4.  Patient to be able to perform all transfers and bed mobility with pain no greater than 2/10 Baseline:  Goal status: INITIAL   5.  TUG  and 5 times sit to stand to improve by 2 seconds Baseline:  Goal status: INITIAL   6.  FOTO to be 70 Baseline:  Goal status: INITIAL     PLAN: PT FREQUENCY: 2x/week   PT DURATION: 8 weeks   PLANNED INTERVENTIONS: Therapeutic exercises, Therapeutic activity, Neuromuscular re-education, Balance training, Gait training, Patient/Family education, Self Care, Joint mobilization, Stair training, Aquatic Therapy, Dry Needling, Electrical stimulation, Spinal mobilization, Cryotherapy, Moist heat, Taping, Ultrasound, Ionotophoresis 42m/ml Dexamethasone, Manual therapy, and Re-evaluation.   PLAN FOR NEXT SESSION: Progress core work as tolerated, progress palloff series and add to HEP, NuStep,  progress core strengthening activities, incorporate UE strength into trunk stabilization.       JAnderson MaltaB. Jaysin Gayler, PT 11/27/21 9:46 AM   [facility] [address]  [place], [place] [address] Phone # [phone] Fax [phone]

## 2021-12-02 ENCOUNTER — Ambulatory Visit: Payer: Medicare Other | Admitting: Rehabilitative and Restorative Service Providers"

## 2021-12-03 ENCOUNTER — Ambulatory Visit (INDEPENDENT_AMBULATORY_CARE_PROVIDER_SITE_OTHER): Payer: Medicare Other | Admitting: Nurse Practitioner

## 2021-12-03 ENCOUNTER — Encounter: Payer: Self-pay | Admitting: Nurse Practitioner

## 2021-12-03 DIAGNOSIS — E669 Obesity, unspecified: Secondary | ICD-10-CM

## 2021-12-03 DIAGNOSIS — G4733 Obstructive sleep apnea (adult) (pediatric): Secondary | ICD-10-CM

## 2021-12-03 NOTE — Assessment & Plan Note (Signed)
Longstanding history of OSA on CPAP. Previously followed by [facility] clinic. He has excellent compliance and wears it nightly. He's not exactly sure what his pressure settings are but from his previous notes, look like he was placed on 9 cmH2O in 2014. He states they haven't made changes, aside from a new machine, since then. We will request records from previous DME to obtain download report.   Patient Instructions  Continue to use CPAP every night, minimum of 4-6 hours a night.  Change equipment every 30 days or as directed by DME. Wash your tubing with warm soap and water daily, hang to dry. Wash humidifier portion weekly.  Be aware of reduced alertness and do not drive or operate heavy machinery if experiencing this or drowsiness.  Exercise encouraged, as tolerated. Avoid or decrease alcohol consumption and medications that make you more sleepy, if possible. Notify if persistent daytime sleepiness occurs even with consistent use of CPAP.  Put a SD card into your CPAP machine so we can get information on usage and control of your sleep apnea We will try to get a download from your medical supply company; if we are unable to, please bring the SD card up here for [place] to review in 2 weeks and then I can send orders for new CPAP for you.  Follow up in 10-12 weeks to make sure new CPAP is working well with Joellen Jersey Rhoda Waldvogel,NP or sooner if needed

## 2021-12-03 NOTE — Patient Instructions (Signed)
Continue to use CPAP every night, minimum of 4-6 hours a night.  Change equipment every 30 days or as directed by DME. Wash your tubing with warm soap and water daily, hang to dry. Wash humidifier portion weekly.  Be aware of reduced alertness and do not drive or operate heavy machinery if experiencing this or drowsiness.  Exercise encouraged, as tolerated. Avoid or decrease alcohol consumption and medications that make you more sleepy, if possible. Notify if persistent daytime sleepiness occurs even with consistent use of CPAP.  Put a SD card into your CPAP machine so we can get information on usage and control of your sleep apnea We will try to get a download from your medical supply company; if we are unable to, please bring the SD card up here for [place] to review in 2 weeks and then I can send orders for new CPAP for you.  Follow up in 10-12 weeks to make sure new CPAP is working well with Joellen Jersey [place],NP or sooner if needed

## 2021-12-03 NOTE — Progress Notes (Signed)
Reviewed and agree with assessment/plan.   Chesley Mires, MD [facility] Pulmonary/Critical Care 12/03/2021, 11:25 AM Pager:  [phone]

## 2021-12-03 NOTE — Progress Notes (Signed)
$'@Patient'B$  ID: Noah Lane, male    DOB: [birth year], 66 y.o.   MRN: [id number]  Chief Complaint  Patient presents with   Consult    Consult for sleep. Pt does have current cpap machine. Has been on it for 10 years. Home sleep test was years ago [facility]) His concern is his cpap is old and is needing a new one.     Referring provider: [name]*  HPI: 66 year old male, former smoker referred for sleep consult. Past medical history significant for DM II, obesity, ED, HLD, OSA on CPAP.  TEST/EVENTS:  Sleep apnea AHI 16  05/2012: CPAP titration 11 cmH2O; weight 125 kg >> recommended 9 cmH2O  12/03/2021: Today - sleep consult Patient presents today for sleep consult referred by Dr. Nani Ravens.  He has had a diagnosis of sleep apnea for close to 10 years.  He has been on CPAP since 2014.  Original study was done at [facility] clinic.  Sleeps with his machine every night.  His current machine is about 65 to 68 years old.  He would like to see if he could get a new one.  He also is interested in a smaller one that would be better for travel. He has no concerns or complaints regarding his CPAP for his sleep today.  He wakes in the morning feeling well rested.  Excellent compliance and cannot sleep without his CPAP.  No breakthrough snoring, excessive daytime fatigue, morning headache, drowsy driving, sleep parasomnia/paralysis.  No history of narcolepsy or cataplexy.  He has tried an oral appliance in the past which did not do much for him.  He still had snoring with it. He goes to bed between 930 and 10:30 PM.  Falls asleep within 1520 minutes.  Does not take any sleep aids.  Rarely wakes up throughout the night.  Officially gets a bed in the morning around 530 to 6 AM.  His weight has been up and down 20 pounds over the last 2 years.  He has a history of diabetes, well controlled on metformin.  No history of stroke or cardiac problems. He is a former smoker, quit 30 years ago.   Drinks 3 glasses of wine a week.  He lives at home with his spouse.  Works as a Counselling psychologist.  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  Past family history of father and mother with heart disease.  Epworth 4  No Known Allergies  Immunization History  Administered Date(s) Administered   Fluad Quad(high Dose 65+) 01/06/2021   Influenza,inj,Quad PF,6+ Mos 11/22/2016   PFIZER(Purple Top)SARS-COV-2 Vaccination 03/22/2019, 04/12/2019, 12/06/2019   PNEUMOCOCCAL CONJUGATE-20 01/06/2021   Pfizer Covid-19 Vaccine Bivalent Booster 48yr & up 12/08/2020    Past Medical History:  Diagnosis Date   Diabetes mellitus type 2 in obese ([place]    Mixed hyperlipidemia     Tobacco History: Social History   Tobacco Use  Smoking Status Former   Packs/day: 0.50   Types: Cigarettes   Start date: 1978   Quit date: 1993   Years since quitting: 30.7  Smokeless Tobacco Never   Counseling given: Not Answered   Outpatient Medications Prior to Visit  Medication Sig Dispense Refill   atorvastatin (LIPITOR) 20 MG tablet TAKE 1 TABLET BY MOUTH DAILY 90 tablet 3   metFORMIN (GLUCOPHAGE-XR) 500 MG 24 hr tablet Take 1 tablet (500 mg total) by mouth daily with breakfast. 90 tablet 2   nitroGLYCERIN (NITRODUR -  DOSED IN MG/24 HR) 0.2 mg/hr patch Cut and apply 1/4 patch to most painful area q24h. 30 patch 11   No facility-administered medications prior to visit.     Review of Systems:   Constitutional: No weight loss or gain, night sweats, fevers, chills, fatigue, or lassitude. HEENT: No headaches, difficulty swallowing, tooth/dental problems, or sore throat. No sneezing, itching, ear ache, nasal congestion, or post nasal drip CV:  No chest pain, orthopnea, PND, swelling in lower extremities, anasarca, dizziness, palpitations, syncope Resp: No shortness of breath with exertion or at rest. No excess mucus or change in color of mucus. No productive or non-productive. No  hemoptysis. No wheezing.  No chest wall deformity GI:  No heartburn, indigestion, abdominal pain, nausea, vomiting, diarrhea, change in bowel habits, loss of appetite, bloody stools.  GU: No dysuria, change in color of urine, urgency or frequency.  No flank pain, no hematuria  Skin: No rash, lesions, ulcerations MSK:  No joint pain or swelling.  No decreased range of motion.  No back pain. Neuro: No dizziness or lightheadedness.  Psych: No depression or anxiety. Mood stable.     Physical Exam:  BP 128/66 (BP Location: Left Arm, Patient Position: Sitting, Cuff Size: Normal)   Pulse 60   Ht '6\' 1"'$  (1.854 m)   Wt 241 lb (109.3 kg)   SpO2 96%   BMI 31.80 kg/m   GEN: Pleasant, interactive, well-appearing; obese; in no acute distress. HEENT:  Normocephalic and atraumatic. PERRLA. Sclera white. Nasal turbinates pink, moist and patent bilaterally. No rhinorrhea present. Oropharynx pink and moist, without exudate or edema. No lesions, ulcerations, or postnasal drip. Mallampati II NECK:  Supple w/ fair ROM. No JVD present. Normal carotid impulses w/o bruits. Thyroid symmetrical with no goiter or nodules palpated. No lymphadenopathy.   CV: RRR, no m/r/g, no peripheral edema. Pulses intact, +2 bilaterally. No cyanosis, pallor or clubbing. PULMONARY:  Unlabored, regular breathing. Clear bilaterally A&P w/o wheezes/rales/rhonchi. No accessory muscle use.  GI: BS present and normoactive. Soft, non-tender to palpation. No organomegaly or masses detected.  MSK: No erythema, warmth or tenderness. No deformities or joint swelling noted.  Neuro: A/Ox3. No focal deficits noted.   Skin: Warm, no lesions or rashe Psych: Normal affect and behavior. Judgement and thought content appropriate.     Lab Results:  CBC    Component Value Date/Time   WBC 6.1 01/09/2021 0852   RBC 4.94 01/09/2021 0852   HGB 13.8 01/09/2021 0852   HCT 41.8 01/09/2021 0852   PLT 156.0 01/09/2021 0852   MCV 84.6 01/09/2021  0852   MCH 28.7 05/23/2020 1628   MCHC 33.1 01/09/2021 0852   RDW 13.9 01/09/2021 0852    BMET    Component Value Date/Time   NA 136 10/20/2021 0751   K 4.3 10/20/2021 0751   CL 103 10/20/2021 0751   CO2 29 10/20/2021 0751   GLUCOSE 135 (H) 10/20/2021 0751   BUN 25 (H) 10/20/2021 0751   CREATININE 1.06 10/20/2021 0751   CREATININE 1.11 05/23/2020 1628   CALCIUM 8.5 10/20/2021 0751    BNP No results found for: "BNP"   Imaging:  No results found.  Triamcinolone Acetonide (ZILRETTA) intra-articular injection 32 mg     Date Action Dose Route User   11/25/2021 1155 Given 32 mg Intra-articular Jonathon Resides N, CMA           No data to display          No results found for: "NITRICOXIDE"  Assessment & Plan:   OSA on CPAP Longstanding history of OSA on CPAP. Previously followed by [facility] clinic. He has excellent compliance and wears it nightly. He's not exactly sure what his pressure settings are but from his previous notes, look like he was placed on 9 cmH2O in 2014. He states they haven't made changes, aside from a new machine, since then. We will request records from previous DME to obtain download report.   Patient Instructions  Continue to use CPAP every night, minimum of 4-6 hours a night.  Change equipment every 30 days or as directed by DME. Wash your tubing with warm soap and water daily, hang to dry. Wash humidifier portion weekly.  Be aware of reduced alertness and do not drive or operate heavy machinery if experiencing this or drowsiness.  Exercise encouraged, as tolerated. Avoid or decrease alcohol consumption and medications that make you more sleepy, if possible. Notify if persistent daytime sleepiness occurs even with consistent use of CPAP.  Put a SD card into your CPAP machine so we can get information on usage and control of your sleep apnea We will try to get a download from your medical supply company; if we are unable to, please  bring the SD card up here for [place] to review in 2 weeks and then I can send orders for new CPAP for you.  Follow up in 10-12 weeks to make sure new CPAP is working well with Joellen Jersey Shondell Fabel,NP or sooner if needed     Obesity (BMI 30.0-34.9) BMI 31.8.  Healthy weight loss measures encouraged.    I spent 35 minutes of dedicated to the care of this patient on the date of this encounter to include pre-visit review of records, face-to-face time with the patient discussing conditions above, post visit ordering of testing, clinical documentation with the electronic health record, making appropriate referrals as documented, and communicating necessary findings to members of the patients care team.  Clayton Bibles, NP 12/03/2021  Pt aware and understands NP's role.

## 2021-12-03 NOTE — Assessment & Plan Note (Signed)
BMI 31.8.  Healthy weight loss measures encouraged.

## 2021-12-04 ENCOUNTER — Ambulatory Visit: Payer: Medicare Other | Attending: Family Medicine

## 2021-12-04 DIAGNOSIS — M25561 Pain in right knee: Secondary | ICD-10-CM | POA: Insufficient documentation

## 2021-12-04 DIAGNOSIS — R262 Difficulty in walking, not elsewhere classified: Secondary | ICD-10-CM | POA: Diagnosis present

## 2021-12-04 DIAGNOSIS — M25552 Pain in left hip: Secondary | ICD-10-CM | POA: Insufficient documentation

## 2021-12-04 DIAGNOSIS — M545 Low back pain, unspecified: Secondary | ICD-10-CM | POA: Diagnosis not present

## 2021-12-04 DIAGNOSIS — G8929 Other chronic pain: Secondary | ICD-10-CM | POA: Insufficient documentation

## 2021-12-04 DIAGNOSIS — M25551 Pain in right hip: Secondary | ICD-10-CM | POA: Insufficient documentation

## 2021-12-04 DIAGNOSIS — M25562 Pain in left knee: Secondary | ICD-10-CM | POA: Insufficient documentation

## 2021-12-04 DIAGNOSIS — M25511 Pain in right shoulder: Secondary | ICD-10-CM | POA: Diagnosis present

## 2021-12-04 DIAGNOSIS — M6281 Muscle weakness (generalized): Secondary | ICD-10-CM | POA: Diagnosis present

## 2021-12-04 NOTE — Therapy (Signed)
OUTPATIENT PHYSICAL THERAPY TREATMENT NOTE   Patient Name: Noah Lane MRN: [id number] DOB:[birth year], 66 y.o., male Today's Date: 12/04/2021   REFERRING PROVIDER: Rosemarie Ax, MD   END OF SESSION:   PT End of Session - 12/04/21 0806     Visit Number 8    Date for PT Re-Evaluation 12/14/21    Authorization Type Medicare:  K[place] Modifier Needed    Progress Note Due on Visit 10    PT Start Time 0805    PT Stop Time 0850    PT Time Calculation (min) 45 min    Activity Tolerance Patient tolerated treatment well    Behavior During Therapy [facility] for tasks assessed/performed                Past Medical History:  Diagnosis Date   Diabetes mellitus type 2 in obese [facility])    Mixed hyperlipidemia    Past Surgical History:  Procedure Laterality Date   HERNIA REPAIR  2012   Patient Active Problem List   Diagnosis Date Noted   OSA on CPAP 12/03/2021   Obesity (BMI 30.0-34.9) 12/03/2021   Mixed hyperlipidemia 10/20/2021   Strain of lumbar region 10/16/2021   Strain of calf muscle 07/22/2021   Patellofemoral pain syndrome of both knees 02/24/2021   Rotator cuff tendinitis, right 12/28/2019   Hip pain, bilateral 05/28/2019   Chronic bilateral low back pain without sciatica 05/28/2019   Diabetes mellitus type 2 in obese ([place]) 05/28/2019   Erectile dysfunction 05/28/2019   Pain and swelling of left knee 03/12/2019   Lateral epicondylitis, right elbow 03/15/2017    REFERRING DIAG: S39.012A (ICD-10-CM) - Strain of lumbar region, initial encounter    THERAPY DIAG:  Acute bilateral low back pain without sciatica  Difficulty in walking, not elsewhere classified  Muscle weakness (generalized)  Chronic pain of left knee  Chronic pain of right knee  Pain in right hip  Pain in left hip  Chronic right shoulder pain  Rationale for Evaluation and Treatment Rehabilitation  ONSET DATE: 10/16/2021    PERTINENT HISTORY: fall  PRECAUTIONS: fall, 2x in past  17mo OCCUPATION: real estate broker   PLOF: Independent, Vocation/Vocational requirements: travels for work   PATIENT GOALS to be able to resume plof and avoid falling again  SUBJECTIVE: Pt reports no new complaints.  No soreness from last session.  No falls.     PAIN:  Are you having pain? Yes: NPRS scale: 5/10 Pain location: pain when moving quickly Pain description: sharp, LBP without radicular symptoms Aggravating factors: quick movements Relieving factors: meds   OBJECTIVE: (objective measures completed at initial evaluation unless otherwise dated)   DIAGNOSTIC FINDINGS:  Lumbar Radiograph on 10/16/2021: Five lumbar type vertebra. There is no acute fracture or subluxation of the lumbar spine. There is multilevel degenerative changes with disc space narrowing, endplate irregularity and spurring/osteophyte. Multilevel facet arthropathy. The visualized posterior elements appear intact. There is atherosclerotic calcification of the aorta. The soft tissues are grossly unremarkable.   PATIENT SURVEYS:  Eval:  FFOTO 51goal is 75  SCREENING FOR RED FLAGS: Bowel or bladder incontinence: No Spinal tumors: No Cauda equina syndrome: No Compression fracture: No Abdominal aneurysm: No   COGNITION:           Overall cognitive status: Within functional limits for tasks assessed                          SENSATION: Diabetic neuropathy that  was present prior to falling but no radicular symptoms   MUSCLE LENGTH: Hamstrings: Right 45 deg; Left 50 deg Thomas test: Right pos ; Left pos    POSTURE: rounded shoulders, increased lumbar lordosis, and anterior pelvic tilt     LUMBAR ROM:    Active  A/PROM  eval  Flexion Fingertips to just past patella  Extension WNL  Right lateral flexion Fingertips to joint line  Left lateral flexion Fingertips to joint line  Right rotation    Left rotation     (Blank rows = not tested)   LOWER EXTREMITY ROM:      WFL   LOWER EXTREMITY  MMT:     Eval:  Generally 4+/5 bilaterally with exception of hip ER and extension 3+/5 bilaterally   LUMBAR SPECIAL TESTS:  Eval:  Straight leg raise test: Negative and FABER test: Positive   FUNCTIONAL TESTS:  Eval: 5 times sit to stand: 13.74 sec Timed up and go (TUG): 8.70   GAIT: Distance walked: 50 Assistive device utilized: None Level of assistance: Complete Independence Comments: Externally rotated LE's bilaterally       TODAY'S TREATMENT:  12/04/2021: NuStep L6 x 7' with PT present to discuss symptoms (refused- "I'm already warm") Lower trunk rotation Right IT band and right hip stretch  10 hold 10 sec PPT x 20 PPT with 90/90 heel tap PPT with 90/90 heel tap with alternating arms and legs PPT with dying bug x 20 PPT with SLR to shoulder flexion/extension with red physio ball x 20 each leg  PPT with dying bug with red physioball PPT with physio ball pass 2 x 10 Seated hollow hold with blue plyo ball hip to hip x 20  11/27/2021: NuStep L6 x 7' with PT present to discuss symptoms (refused- "I'm already warm") Right IT band and right hip stretch  10 hold 10 sec PPT x 20 PPT with 90/90 heel tap PPT with 90/90 heel tap with alternating arms and legs PPT with dying bug x 20 PPT with SLR to shoulder flexion/extension with red physio ball x 20 each leg  PPT with dying bug with red physioball PPT with physio ball pass 2 x 10 Seated hollow hold with blue plyo ball hip to hip x 20  11/24/2021: NuStep L6 x 7' with PT present to discuss symptoms Resisted backwards gait with 20# cable pulley x10 (cuing for maintaining core stability) Seated lat pulldown 2x10 55lb Rows 45# 2x10 Hamstring stretch on Power Plate on stretch setting 30 Hz 2x30 sec bilat each Seated reverse crunches and obliques reverse crunches holding orange weighted ball at chest x10 each Quadruped cat/cow x10 Prayer stretch 5x5 sec Standing palloff press blue band 2x10, stir the pot 2x10   PATIENT  EDUCATION:  Education details: Initiated HEP Person educated: Patient Education method: Consulting civil engineer, Media planner, Verbal cues, and Handouts Education comprehension: verbalized understanding, returned demonstration, and verbal cues required     HOME EXERCISE PROGRAM: Access Code: [email or web address] URL: [email or web address] Date: 12/04/2021 Prepared by: Candyce Churn  Program Notes on ball to straight leg lift, DO NOT LIFT UPPER BODY!    Exercises - Supine Hamstring Stretch with Strap  - 1 x daily - 7 x weekly - 1 sets - 3 reps - 20 hold - Supine ITB Stretch with Strap  - 1 x daily - 7 x weekly - 1 sets - 3 reps - 20 hold - Thomas Stretch on Table  - 1 x daily - 7 x weekly - 1 sets - 3  reps - 20 hold - Supine Posterior Pelvic Tilt  - 1 x daily - 7 x weekly - 3 sets - 10 reps - Standing Quad Stretch with Table and Chair Support  - 1 x daily - 7 x weekly - 1 sets - 3 reps - 20 hold - Supine Bridge with Spinal Articulation  - 1 x daily - 7 x weekly - 2 sets - 10 reps - Modified Eccentric Sit Up in Backless Chair  - 1 x daily - 7 x weekly - 2 sets - 10 reps - Dead Bug  - 1 x daily - 7 x weekly - 2 sets - 10 reps - Supine Lower Trunk Rotation  - 1 x daily - 7 x weekly - 1 sets - 10 reps - 5 hold - Cat Cow  - 1 x daily - 7 x weekly - 1 sets - 10 reps - 5 hold - Child's Pose Stretch  - 1 x daily - 7 x weekly - 1 sets - 10 reps - 5 hold - Sidelying Thoracic Rotation with Open Book  - 1 x daily - 7 x weekly - 3 sets - 10 reps - Dead Bug with Swiss Ball  - 1 x daily - 7 x weekly - 3 sets - 10 reps - Single Leg V-Up with Medicine Ball  - 1 x daily - 7 x weekly - 3 sets - 10 repsAccess Code: [email or web address] URL: [email or web address] Date: 11/04/2021 Prepared by: Venetia Night Beuhring  Exercises - Supine Hamstring Stretch with Strap  - 1 x daily - 7 x weekly - 1 sets - 3 reps - 20 hold - Supine ITB Stretch with Strap  - 1 x daily - 7 x weekly - 1 sets - 3 reps - 20 hold - Thomas  Stretch on Table  - 1 x daily - 7 x weekly - 1 sets - 3 reps - 20 hold - Supine Posterior Pelvic Tilt  - 1 x daily - 7 x weekly - 3 sets - 10 reps - Standing Quad Stretch with Table and Chair Support  - 1 x daily - 7 x weekly - 1 sets - 3 reps - 20 hold - Supine Bridge with Spinal Articulation  - 1 x daily - 7 x weekly - 2 sets - 10 reps - Modified Eccentric Sit Up in Backless Chair  - 1 x daily - 7 x weekly - 2 sets - 10 reps - Dead Bug  - 1 x daily - 7 x weekly - 2 sets - 10 reps - Supine Lower Trunk Rotation  - 1 x daily - 7 x weekly - 1 sets - 10 reps - 5 hold - Cat Cow  - 1 x daily - 7 x weekly - 1 sets - 10 reps - 5 hold - Child's Pose Stretch  - 1 x daily - 7 x weekly - 1 sets - 10 reps - 5 hold - Sidelying Thoracic Rotation with Open Book  - 1 x daily - 7 x weekly - 3 sets - 10 reps  ASSESSMENT:   CLINICAL IMPRESSION: Crispin was able to do the exercises with improved technique.  He still fatigues easily and could not complete all reps on physio ball tasks.   He needs frequent re-direction to stay on task.   Pt continues to require skilled PT to progress towards goal related activities.       OBJECTIVE IMPAIRMENTS Abnormal gait, decreased activity tolerance, decreased balance, decreased endurance, decreased mobility, difficulty  walking, decreased ROM, decreased strength, decreased safety awareness, increased fascial restrictions, increased muscle spasms, impaired flexibility, obesity, and pain.    ACTIVITY LIMITATIONS carrying, lifting, bending, sitting, standing, squatting, sleeping, stairs, transfers, bed mobility, bathing, dressing, and hygiene/grooming   PARTICIPATION LIMITATIONS: meal prep, cleaning, laundry, driving, shopping, community activity, and yard work   [place] and 1-2 comorbidities: obesity and diabetes  are also affecting patient's functional outcome.    REHAB POTENTIAL: Good   CLINICAL DECISION MAKING: Stable/uncomplicated   EVALUATION  COMPLEXITY: Low     GOALS: Goals reviewed with patient? Yes   SHORT TERM GOALS: Target date: 11/16/2021   Pain report to be no greater than 4/10  Baseline: continues to be 5-6/10 Goal status: ongoing   2.  Patient will be independent with initial HEP  Baseline:  Goal status: MET   3.  Patient to be able to sleep 4 consecutive hours without waking with pain Baseline:  Goal status: MET   LONG TERM GOALS: Target date: 12/14/2021   Patient to report pain no greater than 2/10  Baseline:  Goal status: INITIAL   2.  Patient to be independent with advanced HEP  Baseline:  Goal status: ONGOING   3.  Patient to be able to sleep through the night  Baseline:  Goal status: ongoing, awakens with pain and uses heat   4.  Patient to be able to perform all transfers and bed mobility with pain no greater than 2/10 Baseline:  Goal status: INITIAL   5.  TUG  and 5 times sit to stand to improve by 2 seconds Baseline:  Goal status: INITIAL   6.  FOTO to be 70 Baseline:  Goal status: INITIAL     PLAN: PT FREQUENCY: 2x/week   PT DURATION: 8 weeks   PLANNED INTERVENTIONS: Therapeutic exercises, Therapeutic activity, Neuromuscular re-education, Balance training, Gait training, Patient/Family education, Self Care, Joint mobilization, Stair training, Aquatic Therapy, Dry Needling, Electrical stimulation, Spinal mobilization, Cryotherapy, Moist heat, Taping, Ultrasound, Ionotophoresis 17m/ml Dexamethasone, Manual therapy, and Re-evaluation.   PLAN FOR NEXT SESSION: Progress core work as tolerated, progress palloff series and add to HEP, NuStep, progress core strengthening activities, incorporate UE strength into trunk stabilization.       JAnderson MaltaB. Diana Armijo, PT 12/04/21 9:36 AM   BAurora Center[address] [place]ullG[place]ubert  [id number] # [phone] [phone]

## 2021-12-07 ENCOUNTER — Telehealth: Payer: Self-pay

## 2021-12-07 NOTE — Patient Outreach (Signed)
  Care Coordination   Initial Visit Note   12/07/2021 Name: Noah Lane MRN: [id number] DOB: 17-Nov-1955  Noah Lane is a 66 y.o. year old male who sees Noah Lane, Noah Oyster, DO for primary care. I spoke with  Noah Lane by phone today.  What matters to the patients health and wellness today?  Noah Lane denies any care coordination needs at this time.    Goals Addressed             This Visit's Progress    COMPLETED: Care Coordination Activities-no follow up required       Care Coordination Interventions: Discussed Care Coordination program       SDOH assessments and interventions completed:  No  Care Coordination Interventions Activated:  No  Care Coordination Interventions:  No, not indicated   Follow up plan: No further intervention required.   Encounter Outcome:  Pt. Refused   Thea Silversmith, RN, MSN, BSN, [place] Coordinator [phone]

## 2021-12-08 ENCOUNTER — Ambulatory Visit: Payer: Medicare Other

## 2021-12-09 ENCOUNTER — Ambulatory Visit: Payer: Medicare Other | Admitting: Rehabilitative and Restorative Service Providers"

## 2021-12-09 ENCOUNTER — Encounter: Payer: Self-pay | Admitting: Rehabilitative and Restorative Service Providers"

## 2021-12-09 DIAGNOSIS — M6281 Muscle weakness (generalized): Secondary | ICD-10-CM

## 2021-12-09 DIAGNOSIS — M545 Low back pain, unspecified: Secondary | ICD-10-CM

## 2021-12-09 DIAGNOSIS — R262 Difficulty in walking, not elsewhere classified: Secondary | ICD-10-CM

## 2021-12-09 NOTE — Therapy (Signed)
OUTPATIENT PHYSICAL THERAPY TREATMENT NOTE   Patient Name: Noah Lane MRN: [id number] DOB:[birth year], 66 y.o., male Today's Date: 12/09/2021   REFERRING PROVIDER: Rosemarie Ax, MD   END OF SESSION:   PT End of Session - 12/09/21 1234     Visit Number 9    Date for PT Re-Evaluation 12/14/21    Authorization Type Medicare:  [place] Modifier Needed    Progress Note Due on Visit 10    PT Start Time 1232    PT Stop Time 1310    PT Time Calculation (min) 38 min    Activity Tolerance Patient tolerated treatment well    Behavior During Therapy WFL for tasks assessed/performed                Past Medical History:  Diagnosis Date   Diabetes mellitus type 2 in obese ([place])    Mixed hyperlipidemia    Past Surgical History:  Procedure Laterality Date   HERNIA REPAIR  2012   Patient Active Problem List   Diagnosis Date Noted   OSA on CPAP 12/03/2021   Obesity (BMI 30.0-34.9) 12/03/2021   Mixed hyperlipidemia 10/20/2021   Strain of lumbar region 10/16/2021   Strain of calf muscle 07/22/2021   Patellofemoral pain syndrome of both knees 02/24/2021   Rotator cuff tendinitis, right 12/28/2019   Hip pain, bilateral 05/28/2019   Chronic bilateral low back pain without sciatica 05/28/2019   Diabetes mellitus type 2 in obese ([place]) 05/28/2019   Erectile dysfunction 05/28/2019   Pain and swelling of left knee 03/12/2019   Lateral epicondylitis, right elbow 03/15/2017    REFERRING DIAG: S39.012A (ICD-10-CM) - Strain of lumbar region, initial encounter    THERAPY DIAG:  Acute bilateral low back pain without sciatica  Difficulty in walking, not elsewhere classified  Muscle weakness (generalized)  Rationale for Evaluation and Treatment Rehabilitation  ONSET DATE: 10/16/2021    PERTINENT HISTORY: fall  PRECAUTIONS: fall, 2x in past 46mo OCCUPATION: real estate broker   PLOF: Independent, Vocation/Vocational requirements: travels for work   PATIENT GOALS to be  able to resume plof and avoid falling again  SUBJECTIVE: Pt reports that he is having some increased pain today.  PAIN:  Are you having pain? Yes: NPRS scale: 7/10 Pain location: pain when moving quickly Pain description: sharp, LBP without radicular symptoms Aggravating factors: quick movements Relieving factors: meds   OBJECTIVE: (objective measures completed at initial evaluation unless otherwise dated)   DIAGNOSTIC FINDINGS:  Lumbar Radiograph on 10/16/2021: Five lumbar type vertebra. There is no acute fracture or subluxation of the lumbar spine. There is multilevel degenerative changes with disc space narrowing, endplate irregularity and spurring/osteophyte. Multilevel facet arthropathy. The visualized posterior elements appear intact. There is atherosclerotic calcification of the aorta. The soft tissues are grossly unremarkable.   PATIENT SURVEYS:  Eval:  FFOTO 40goal is 739  SCREENING FOR RED FLAGS: Bowel or bladder incontinence: No Spinal tumors: No Cauda equina syndrome: No Compression fracture: No Abdominal aneurysm: No   COGNITION:           Overall cognitive status: Within functional limits for tasks assessed                          SENSATION: Diabetic neuropathy that was present prior to falling but no radicular symptoms   MUSCLE LENGTH: Hamstrings: Right 45 deg; Left 50 deg Thomas test: Right pos ; Left pos    POSTURE: rounded shoulders, increased  lumbar lordosis, and anterior pelvic tilt     LUMBAR ROM:    Active  A/PROM  eval  Flexion Fingertips to just past patella  Extension WNL  Right lateral flexion Fingertips to joint line  Left lateral flexion Fingertips to joint line  Right rotation    Left rotation     (Blank rows = not tested)   LOWER EXTREMITY ROM:      WFL   LOWER EXTREMITY MMT:     Eval:  Generally 4+/5 bilaterally with exception of hip ER and extension 3+/5 bilaterally   LUMBAR SPECIAL TESTS:  Eval:  Straight leg raise  test: Negative and FABER test: Positive   FUNCTIONAL TESTS:  Eval: 5 times sit to stand: 13.74 sec Timed up and go (TUG): 8.70   GAIT: Distance walked: 50 Assistive device utilized: None Level of assistance: Complete Independence Comments: Externally rotated LE's bilaterally       TODAY'S TREATMENT:  12/09/2021: NuStep L6 x 7' with PT present to discuss symptoms Seated 3 way rollout with blue pball 5x10 sec each Seated 4 way pelvic tilt on dynadisc x20 each Manual Therapy:  Addaday to bilateral thoracic and lumbar multifidi, glutes/piriformis, hamstrings, IT band, and calves Prone on elbows x2 min Prone hip extension 2x10 bilat Hooklying lower trunk rotation 5x5 sec bilat Supine posterior pelvic tilt with marching 2x10 Dying bug with red pball x10   12/04/2021: NuStep L6 x 7' with PT present to discuss symptoms (refused- "I'm already warm") Lower trunk rotation Right IT band and right hip stretch  10 hold 10 sec PPT x 20 PPT with 90/90 heel tap PPT with 90/90 heel tap with alternating arms and legs PPT with dying bug x 20 PPT with SLR to shoulder flexion/extension with red physio ball x 20 each leg  PPT with dying bug with red physioball PPT with physio ball pass 2 x 10 Seated hollow hold with blue plyo ball hip to hip x 20  11/27/2021: NuStep L6 x 7' with PT present to discuss symptoms (refused- "I'm already warm") Right IT band and right hip stretch  10 hold 10 sec PPT x 20 PPT with 90/90 heel tap PPT with 90/90 heel tap with alternating arms and legs PPT with dying bug x 20 PPT with SLR to shoulder flexion/extension with red physio ball x 20 each leg  PPT with dying bug with red physioball PPT with physio ball pass 2 x 10 Seated hollow hold with blue plyo ball hip to hip x 20    PATIENT EDUCATION:  Education details: Initiated HEP Person educated: Patient Education method: Consulting civil engineer, Media planner, Verbal cues, and Handouts Education comprehension:  verbalized understanding, returned demonstration, and verbal cues required     HOME EXERCISE PROGRAM: Access Code: [email or web address] URL: [email or web address] Date: 12/04/2021 Prepared by: Candyce Churn  Program Notes on ball to straight leg lift, DO NOT LIFT UPPER BODY!    Exercises - Supine Hamstring Stretch with Strap  - 1 x daily - 7 x weekly - 1 sets - 3 reps - 20 hold - Supine ITB Stretch with Strap  - 1 x daily - 7 x weekly - 1 sets - 3 reps - 20 hold - Thomas Stretch on Table  - 1 x daily - 7 x weekly - 1 sets - 3 reps - 20 hold - Supine Posterior Pelvic Tilt  - 1 x daily - 7 x weekly - 3 sets - 10 reps - Standing Sports administrator with Table and Chair Support  -  1 x daily - 7 x weekly - 1 sets - 3 reps - 20 hold - Supine Bridge with Spinal Articulation  - 1 x daily - 7 x weekly - 2 sets - 10 reps - Modified Eccentric Sit Up in Backless Chair  - 1 x daily - 7 x weekly - 2 sets - 10 reps - Dead Bug  - 1 x daily - 7 x weekly - 2 sets - 10 reps - Supine Lower Trunk Rotation  - 1 x daily - 7 x weekly - 1 sets - 10 reps - 5 hold - Cat Cow  - 1 x daily - 7 x weekly - 1 sets - 10 reps - 5 hold - Child's Pose Stretch  - 1 x daily - 7 x weekly - 1 sets - 10 reps - 5 hold - Sidelying Thoracic Rotation with Open Book  - 1 x daily - 7 x weekly - 3 sets - 10 reps - Dead Bug with Swiss Ball  - 1 x daily - 7 x weekly - 3 sets - 10 reps - Single Leg V-Up with Medicine Ball  - 1 x daily - 7 x weekly - 3 sets - 10 repsAccess Code: [email or web address] URL: [email or web address] Date: 11/04/2021 Prepared by: Venetia Night Beuhring  Exercises - Supine Hamstring Stretch with Strap  - 1 x daily - 7 x weekly - 1 sets - 3 reps - 20 hold - Supine ITB Stretch with Strap  - 1 x daily - 7 x weekly - 1 sets - 3 reps - 20 hold - Thomas Stretch on Table  - 1 x daily - 7 x weekly - 1 sets - 3 reps - 20 hold - Supine Posterior Pelvic Tilt  - 1 x daily - 7 x weekly - 3 sets - 10 reps - Standing Quad Stretch  with Table and Chair Support  - 1 x daily - 7 x weekly - 1 sets - 3 reps - 20 hold - Supine Bridge with Spinal Articulation  - 1 x daily - 7 x weekly - 2 sets - 10 reps - Modified Eccentric Sit Up in Backless Chair  - 1 x daily - 7 x weekly - 2 sets - 10 reps - Dead Bug  - 1 x daily - 7 x weekly - 2 sets - 10 reps - Supine Lower Trunk Rotation  - 1 x daily - 7 x weekly - 1 sets - 10 reps - 5 hold - Cat Cow  - 1 x daily - 7 x weekly - 1 sets - 10 reps - 5 hold - Child's Pose Stretch  - 1 x daily - 7 x weekly - 1 sets - 10 reps - 5 hold - Sidelying Thoracic Rotation with Open Book  - 1 x daily - 7 x weekly - 3 sets - 10 reps  ASSESSMENT:   CLINICAL IMPRESSION: Mr Pepitone presents to skilled PT today with increased pain in his lumbar region.  Performed increased stretches and core stability with manual therapy.  Performed manual therapy to lumbar position with pt reporting a decrease in pain at that time.  Pt overall left PT clinic stating overall decreased lumbar pain compared to arrival.  Pt to be reassessed next visit.     OBJECTIVE IMPAIRMENTS Abnormal gait, decreased activity tolerance, decreased balance, decreased endurance, decreased mobility, difficulty walking, decreased ROM, decreased strength, decreased safety awareness, increased fascial restrictions, increased muscle spasms, impaired flexibility, obesity, and pain.    ACTIVITY LIMITATIONS  carrying, lifting, bending, sitting, standing, squatting, sleeping, stairs, transfers, bed mobility, bathing, dressing, and hygiene/grooming   PARTICIPATION LIMITATIONS: meal prep, cleaning, laundry, driving, shopping, community activity, and yard work   [place] and 1-2 comorbidities: obesity and diabetes  are also affecting patient's functional outcome.    REHAB POTENTIAL: Good   CLINICAL DECISION MAKING: Stable/uncomplicated   EVALUATION COMPLEXITY: Low     GOALS: Goals reviewed with patient? Yes   SHORT TERM GOALS:  Target date: 11/16/2021   Pain report to be no greater than 4/10  Baseline: continues to be 5-6/10 Goal status: ongoing   2.  Patient will be independent with initial HEP  Baseline:  Goal status: MET   3.  Patient to be able to sleep 4 consecutive hours without waking with pain Baseline:  Goal status: MET   LONG TERM GOALS: Target date: 12/14/2021   Patient to report pain no greater than 2/10  Baseline:  Goal status: INITIAL   2.  Patient to be independent with advanced HEP  Baseline:  Goal status: ONGOING   3.  Patient to be able to sleep through the night  Baseline:  Goal status: ongoing, awakens with pain and uses heat   4.  Patient to be able to perform all transfers and bed mobility with pain no greater than 2/10 Baseline:  Goal status: INITIAL   5.  TUG  and 5 times sit to stand to improve by 2 seconds Baseline:  Goal status: INITIAL   6.  FOTO to be 70 Baseline:  Goal status: INITIAL     PLAN: PT FREQUENCY: 2x/week   PT DURATION: 8 weeks   PLANNED INTERVENTIONS: Therapeutic exercises, Therapeutic activity, Neuromuscular re-education, Balance training, Gait training, Patient/Family education, Self Care, Joint mobilization, Stair training, Aquatic Therapy, Dry Needling, Electrical stimulation, Spinal mobilization, Cryotherapy, Moist heat, Taping, Ultrasound, Ionotophoresis 58m/ml Dexamethasone, Manual therapy, and Re-evaluation.   PLAN FOR NEXT SESSION: Progress core work as tolerated, progress palloff series and add to HEP, NuStep, progress core strengthening activities, incorporate UE strength into trunk stabilization.       SJuel Burrow PT 12/09/21 1:27 PM   BSt [facility] And [facility] CenterSpecialty Rehab Services [address] [place] [place] [address]Phone # [phone] [phone]

## 2021-12-11 ENCOUNTER — Ambulatory Visit: Payer: Medicare Other | Admitting: Rehabilitative and Restorative Service Providers"

## 2021-12-11 ENCOUNTER — Encounter: Payer: Self-pay | Admitting: Rehabilitative and Restorative Service Providers"

## 2021-12-11 DIAGNOSIS — M545 Low back pain, unspecified: Secondary | ICD-10-CM

## 2021-12-11 DIAGNOSIS — M6281 Muscle weakness (generalized): Secondary | ICD-10-CM

## 2021-12-11 DIAGNOSIS — R262 Difficulty in walking, not elsewhere classified: Secondary | ICD-10-CM

## 2021-12-11 NOTE — Therapy (Signed)
OUTPATIENT PHYSICAL THERAPY TREATMENT NOTE AND DISCHARGE SUMMARY   Patient Name: Noah Lane MRN: [id number] DOB:[birth year], 66 y.o., male Today's Date: 12/11/2021   REFERRING PROVIDER: Rosemarie Ax, MD   END OF SESSION:   PT End of Session - 12/11/21 0938     Visit Number 10    Date for PT Re-Evaluation 12/14/21    Authorization Type Medicare:  KX Modifier Needed    Progress Note Due on Visit 10    PT Start Time 0932    PT Stop Time 1010    PT Time Calculation (min) 38 min    Activity Tolerance Patient tolerated treatment well    Behavior During Therapy WFL for tasks assessed/performed                Past Medical History:  Diagnosis Date   Diabetes mellitus type 2 in obese ([place])    Mixed hyperlipidemia    Past Surgical History:  Procedure Laterality Date   HERNIA REPAIR  2012   Patient Active Problem List   Diagnosis Date Noted   OSA on CPAP 12/03/2021   Obesity (BMI 30.0-34.9) 12/03/2021   Mixed hyperlipidemia 10/20/2021   Strain of lumbar region 10/16/2021   Strain of calf muscle 07/22/2021   Patellofemoral pain syndrome of both knees 02/24/2021   Rotator cuff tendinitis, right 12/28/2019   Hip pain, bilateral 05/28/2019   Chronic bilateral low back pain without sciatica 05/28/2019   Diabetes mellitus type 2 in obese ([place]) 05/28/2019   Erectile dysfunction 05/28/2019   Pain and swelling of left knee 03/12/2019   Lateral epicondylitis, right elbow 03/15/2017    REFERRING DIAG: S39.012A (ICD-10-CM) - Strain of lumbar region, initial encounter    THERAPY DIAG:  Acute bilateral low back pain without sciatica  Difficulty in walking, not elsewhere classified  Muscle weakness (generalized)  Rationale for Evaluation and Treatment Rehabilitation  ONSET DATE: 10/16/2021    PERTINENT HISTORY: fall  PRECAUTIONS: fall, 2x in past 71mo OCCUPATION: real estate broker   PLOF: Independent, Vocation/Vocational requirements: travels for work    PATIENT GOALS to be able to resume plof and avoid falling again  SUBJECTIVE: Pt reports that he is feeling better than he was last session.  PAIN:  Are you having pain? Yes: NPRS scale: 3/10 Pain location: pain when moving quickly Pain description: sharp, LBP without radicular symptoms Aggravating factors: quick movements Relieving factors: meds   OBJECTIVE: (objective measures completed at initial evaluation unless otherwise dated)   DIAGNOSTIC FINDINGS:  Lumbar Radiograph on 10/16/2021: Five lumbar type vertebra. There is no acute fracture or subluxation of the lumbar spine. There is multilevel degenerative changes with disc space narrowing, endplate irregularity and spurring/osteophyte. Multilevel facet arthropathy. The visualized posterior elements appear intact. There is atherosclerotic calcification of the aorta. The soft tissues are grossly unremarkable.   PATIENT SURVEYS:  Eval:  FOTO 439[place] is [address]/13/2023:  FOTO 63%   SCREENING FOR RED FLAGS: Bowel or bladder incontinence: No Spinal tumors: No Cauda equina syndrome: No Compression fracture: No Abdominal aneurysm: No   COGNITION:           Overall cognitive status: Within functional limits for tasks assessed                          SENSATION: Diabetic neuropathy that was present prior to falling but no radicular symptoms   MUSCLE LENGTH: Hamstrings: Right 45 deg; Left 50 deg Thomas test: Right pos ;  Left pos    POSTURE: rounded shoulders, increased lumbar lordosis, and anterior pelvic tilt     LUMBAR ROM:    Active  A/PROM  eval  Flexion Fingertips to just past patella  Extension WNL  Right lateral flexion Fingertips to joint line  Left lateral flexion Fingertips to joint line  Right rotation    Left rotation     (Blank rows = not tested)   LOWER EXTREMITY ROM:      WFL   LOWER EXTREMITY MMT:     Eval:  Generally 4+/5 bilaterally with exception of hip ER and extension 3+/5  bilaterally 12/11/2021:  Generally 4 to 4+/5 throughout   LUMBAR SPECIAL TESTS:  Eval:  Straight leg raise test: Negative and FABER test: Positive   FUNCTIONAL TESTS:  Eval: 5 times sit to stand: 13.74 sec Timed up and go (TUG): 8.70  12/11/2021: 5 times sit to stand: 12.69 sec Timed up and go (TUG): 8.5 sec   GAIT: Distance walked: 50 Assistive device utilized: None Level of assistance: Complete Independence Comments: Externally rotated LE's bilaterally       TODAY'S TREATMENT:  12/11/2021: FOTO 5 times sit to/from stand Standing on foam performing shoulder external rotation with green tband 2x10 Standing on foam performing shoulder horizontal abduction with green tband 2x10 Standing on foam performing clean and press with 5# kettlebell 2x10 bilat each Sit to stand on foam holding 5# kettlebell:  x10 with chest press, x10 with overhead press Counter push up 2x10 Lat Pulldown 55# 2x10   12/09/2021: NuStep L6 x 7' with PT present to discuss symptoms Seated 3 way rollout with blue pball 5x10 sec each Seated 4 way pelvic tilt on dynadisc x20 each Manual Therapy:  Addaday to bilateral thoracic and lumbar multifidi, glutes/piriformis, hamstrings, IT band, and calves Prone on elbows x2 min Prone hip extension 2x10 bilat Hooklying lower trunk rotation 5x5 sec bilat Supine posterior pelvic tilt with marching 2x10 Dying bug with red pball x10   12/04/2021: NuStep L6 x 7' with PT present to discuss symptoms (refused- "I'm already warm") Lower trunk rotation Right IT band and right hip stretch  10 hold 10 sec PPT x 20 PPT with 90/90 heel tap PPT with 90/90 heel tap with alternating arms and legs PPT with dying bug x 20 PPT with SLR to shoulder flexion/extension with red physio ball x 20 each leg  PPT with dying bug with red physioball PPT with physio ball pass 2 x 10 Seated hollow hold with blue plyo ball hip to hip x 20     PATIENT EDUCATION:  Education details:  Initiated HEP Person educated: Patient Education method: Consulting civil engineer, Media planner, Verbal cues, and Handouts Education comprehension: verbalized understanding, returned demonstration, and verbal cues required     HOME EXERCISE PROGRAM: Access Code: [email or web address] URL: [email or web address] Date: 12/04/2021 Prepared by: Candyce Churn  Program Notes on ball to straight leg lift, DO NOT LIFT UPPER BODY!    Exercises - Supine Hamstring Stretch with Strap  - 1 x daily - 7 x weekly - 1 sets - 3 reps - 20 hold - Supine ITB Stretch with Strap  - 1 x daily - 7 x weekly - 1 sets - 3 reps - 20 hold - Thomas Stretch on Table  - 1 x daily - 7 x weekly - 1 sets - 3 reps - 20 hold - Supine Posterior Pelvic Tilt  - 1 x daily - 7 x weekly - 3 sets - 10 reps - Standing  Sports administrator with Table and Chair Support  - 1 x daily - 7 x weekly - 1 sets - 3 reps - 20 hold - Supine Bridge with Spinal Articulation  - 1 x daily - 7 x weekly - 2 sets - 10 reps - Modified Eccentric Sit Up in Backless Chair  - 1 x daily - 7 x weekly - 2 sets - 10 reps - Dead Bug  - 1 x daily - 7 x weekly - 2 sets - 10 reps - Supine Lower Trunk Rotation  - 1 x daily - 7 x weekly - 1 sets - 10 reps - 5 hold - Cat Cow  - 1 x daily - 7 x weekly - 1 sets - 10 reps - 5 hold - Child's Pose Stretch  - 1 x daily - 7 x weekly - 1 sets - 10 reps - 5 hold - Sidelying Thoracic Rotation with Open Book  - 1 x daily - 7 x weekly - 3 sets - 10 reps - Dead Bug with Swiss Ball  - 1 x daily - 7 x weekly - 3 sets - 10 reps - Single Leg V-Up with Medicine Ball  - 1 x daily - 7 x weekly - 3 sets - 10 reps   ASSESSMENT:   CLINICAL IMPRESSION: Mr Kienast presents to skilled PT today with improved back pain.  Pt to be moving to [facility] by the end of the month.  He states that he feels confident with his HEP, he can continue to progress.  Updated HEP today to include standing core stabilization, including use of UE.  Pt without any reports  of increased pain during exercise session.  Pt has made progress towards goals and has improved with his FOTO score and nearly met anticipated goal.  Pt is ready for discharge from skilled outpatient PT at this time to continue with HEP and follow up with Dr Raeford Razor, as needed.     OBJECTIVE IMPAIRMENTS Abnormal gait, decreased activity tolerance, decreased balance, decreased endurance, decreased mobility, difficulty walking, decreased ROM, decreased strength, decreased safety awareness, increased fascial restrictions, increased muscle spasms, impaired flexibility, obesity, and pain.    ACTIVITY LIMITATIONS carrying, lifting, bending, sitting, standing, squatting, sleeping, stairs, transfers, bed mobility, bathing, dressing, and hygiene/grooming   PARTICIPATION LIMITATIONS: meal prep, cleaning, laundry, driving, shopping, community activity, and yard work   [place] and 1-2 comorbidities: obesity and diabetes  are also affecting patient's functional outcome.    REHAB POTENTIAL: Good   CLINICAL DECISION MAKING: Stable/uncomplicated   EVALUATION COMPLEXITY: Low     GOALS: Goals reviewed with patient? Yes   SHORT TERM GOALS: Target date: 11/16/2021   Pain report to be no greater than 4/10  Baseline: continues to be 5-6/10 Goal status: MET   2.  Patient will be independent with initial HEP  Baseline:  Goal status: MET   3.  Patient to be able to sleep 4 consecutive hours without waking with pain Baseline:  Goal status: MET   LONG TERM GOALS: Target date: 12/14/2021   Patient to report pain no greater than 2/10  Baseline:  Goal status: PARTIALLY MET on 12/11/2021   2.  Patient to be independent with advanced HEP  Baseline:  Goal status: MET on 12/11/2021   3.  Patient to be able to sleep through the night  Baseline:  Goal status: MET on 12/11/2021 pt reports that he is now sleeping through the night   4.  Patient to  be able to perform all transfers and bed  mobility with pain no greater than 2/10 Baseline:  Goal status: MET on 12/11/21   5.  TUG  and 5 times sit to stand to improve by 2 seconds Baseline:  Goal status: Both scores decreased to within age related norms on 12/11/21.   6.  FOTO to be 70 Baseline:  Goal status: PARTIALLY MET 63% on 12/11/21     PLAN: PT FREQUENCY: 2x/week   PT DURATION: 8 weeks   PLANNED INTERVENTIONS: Therapeutic exercises, Therapeutic activity, Neuromuscular re-education, Balance training, Gait training, Patient/Family education, Self Care, Joint mobilization, Stair training, Aquatic Therapy, Dry Needling, Electrical stimulation, Spinal mobilization, Cryotherapy, Moist heat, Taping, Ultrasound, Ionotophoresis 23m/ml Dexamethasone, Manual therapy, and Re-evaluation.   PLAN FOR NEXT SESSION: Discharge completed on 12/11/21    PHYSICAL THERAPY DISCHARGE SUMMARY   Patient agrees to discharge. Patient goals were  all goals met except overall pain and FOTO score . Patient is being discharged due to maximized rehab potential. Pt has progressed well with improved back pain and will continue with HEP.     SJuel Burrow PT 12/11/21 12:13 PM   [facility] [address] [id number] # [phone] [phone]

## 2022-02-23 ENCOUNTER — Ambulatory Visit (INDEPENDENT_AMBULATORY_CARE_PROVIDER_SITE_OTHER): Payer: Medicare Other | Admitting: Family Medicine

## 2022-02-23 ENCOUNTER — Encounter: Payer: Self-pay | Admitting: Family Medicine

## 2022-02-23 ENCOUNTER — Telehealth: Payer: Self-pay | Admitting: Family Medicine

## 2022-02-23 ENCOUNTER — Other Ambulatory Visit: Payer: Self-pay | Admitting: Family Medicine

## 2022-02-23 ENCOUNTER — Telehealth: Payer: Self-pay

## 2022-02-23 VITALS — BP 138/68 | HR 62 | Temp 98.5°F | Resp 16 | Ht 73.0 in | Wt 244.0 lb

## 2022-02-23 DIAGNOSIS — R0981 Nasal congestion: Secondary | ICD-10-CM | POA: Diagnosis not present

## 2022-02-23 LAB — POCT INFLUENZA A/B
Influenza A, POC: NEGATIVE
Influenza B, POC: NEGATIVE

## 2022-02-23 LAB — POC COVID19 BINAXNOW: SARS Coronavirus 2 Ag: NEGATIVE

## 2022-02-23 MED ORDER — ATORVASTATIN CALCIUM 20 MG PO TABS: 20.0000 mg | ORAL_TABLET | Freq: Every day | ORAL | 3 refills | Status: DC

## 2022-02-23 NOTE — Telephone Encounter (Signed)
Rx sent 

## 2022-02-23 NOTE — Telephone Encounter (Signed)
Pt was seen by TB today.   Patient Name: Noah Lane Gender: Male DOB: December 21, 1955 Age: 66 Y 17 D Return Phone Number: [phone] Client [facility] Night - Client Client Site [facility] - Night Provider Riki Sheer- MD Contact Type Call Who Is Calling Patient / Member / Family / Caregiver Call Type Triage / Clinical Relationship To Patient Self Return Phone Number [phone] (Primary) Chief Complaint WHEEZING Reason for Call Symptomatic / Request for Health Information Initial Comment Caller states wants to schedule and appt; having upper respiratory and head cold sx, productive cough; Wheezing w/exhale; neg for Covid 19 today; Translation No Nurse Assessment Nurse: Rolin Barry, RN, Levada Dy Date/Time (Eastern Time): 02/23/2022 7:00:52 AM Confirm and document reason for call. If symptomatic, describe symptoms. ---Caller states wants to schedule and appt; having upper respiratory and head cold sx, productive cough; Wheezing w/exhale; neg for Covid 19 today; Sx started on Friday. Has been sweating. has not checked the temp. Does the patient have any new or worsening symptoms? ---Yes Will a triage be completed? ---Yes Related visit to physician within the last 2 weeks? ---No Does the PT have any chronic conditions? (i.e. diabetes, asthma, this includes High risk factors for pregnancy, etc.) ---Yes List chronic conditions. ---Type 2 diabetes Is this a behavioral health or substance abuse call? ---No Guidelines Guideline Title Affirmed Question Affirmed Notes Nurse Date/Time (Eastern Time) Cough - Acute Productive Wheezing is present Rolin Barry, RN, Levada Dy 02/23/2022 7:03:20 AM Disp. Time Eilene Ghazi Time) Disposition Final User 02/23/2022 6:58:10 AM Send to Urgent Suzan Slick, Suanne Marker

## 2022-02-23 NOTE — Progress Notes (Signed)
   Acute Office Visit  Subjective:     Patient ID: Noah Lane, male    DOB: [birth year], 66 y.o.   MRN: [id number]  Chief Complaint  Patient presents with   Nasal Congestion   Sore Throat    HPI Patient is in today for URI symptoms.   Patient reports he has had about 4 days of URI symptoms including productive cough, headache, sinus pressure, rhinorrhea, nasal congestion. He denies any fever, chills, nausea, vomiting, diarrhea, sore throat, ear pain, urinary changes. States he has had two negative home COVID tests, but he trusts our tests more. So far he has taken benadryl and ibuprofen with minimal improvement.   He denies any known exposures to Flu/COVID/RSV, but reports he just returned from a trip to [place].         ROS All review of systems negative except what is listed in the HPI      Objective:    BP 138/68   Pulse 62   Temp 98.5 F (36.9 C)   Resp 16   Ht '6\' 1"'$  (1.854 m)   Wt 244 lb (110.7 kg)   SpO2 100%   BMI 32.19 kg/m    Physical Exam Vitals reviewed.  Constitutional:      Appearance: Normal appearance.  HENT:     Right Ear: Tympanic membrane normal.     Left Ear: Tympanic membrane normal.     Nose: Congestion present. No rhinorrhea.     Mouth/Throat:     Mouth: Mucous membranes are moist.     Pharynx: Oropharynx is clear. No pharyngeal swelling or posterior oropharyngeal erythema.  Cardiovascular:     Rate and Rhythm: Normal rate and regular rhythm.     Pulses: Normal pulses.     Heart sounds: Normal heart sounds.  Pulmonary:     Effort: Pulmonary effort is normal.     Breath sounds: Normal breath sounds.  Skin:    General: Skin is warm and dry.  Neurological:     Mental Status: He is alert and oriented to person, place, and time.  Psychiatric:        Mood and Affect: Mood normal.        Behavior: Behavior normal.        Thought Content: Thought content normal.        Judgment: Judgment normal.     No results found for any  visits on 02/23/22.      Assessment & Plan:   Problem List Items Addressed This Visit   None Visit Diagnoses     Nasal congestion    -  Primary Flu and COVID negative.  There are a lot of other viruses going around right now.  Continue supportive measures including rest, hydration, humidifier use, steam showers, warm compresses to sinuses, warm liquids with lemon and honey, and over-the-counter cough, cold, and analgesics as needed.   Please contact office for follow-up if symptoms do not improve or worsen. Seek emergency care if symptoms become severe.   Relevant Orders   POCT Influenza A/B   POC COVID-19       No orders of the defined types were placed in this encounter.   Return if symptoms worsen or fail to improve.  Terrilyn Saver, NP

## 2022-02-23 NOTE — Patient Instructions (Signed)
Flu and COVID negative.  There are a lot of other viruses going around right now.  Continue supportive measures including rest, hydration, humidifier use, steam showers, warm compresses to sinuses, warm liquids with lemon and honey, and over-the-counter cough, cold, and analgesics as needed.   Please contact office for follow-up if symptoms do not improve or worsen. Seek emergency care if symptoms become severe.  The following information is provided as a Human resources officer for ADULT patients only and does NOT take into account PREGNANCY, ALLERGIES, LIVER CONDITIONS, KIDNEY CONDITIONS, GASTROINTESTINAL CONDITIONS, OR PRESCRIPTION MEDICATION INTERACTIONS. Please be sure to ask your provider if the following are safe to take with your specific medical history, conditions, or current medication regimen if you are unsure.   Adult Basic Symptom Management  Congestion: Guaifenesin (Mucinex)- follow directions on packaging with a maximum dose of '2400mg'$  in a 24 hour period.  Pain/Fever: Ibuprofen '200mg'$  - '400mg'$  every 4-6 hours as needed (MAX '1200mg'$  in a 24 hour period) Pain/Fever: Tylenol '500mg'$  -'1000mg'$  every 6-8 hours as needed (MAX '3000mg'$  in a 24 hour period)  Cough: Dextromethorphan (Delsym)- follow directions on packing with a maximum dose of '120mg'$  in a 24 hour period.  Nasal Stuffiness: Saline nasal spray and/or Nettie Pot with sterile saline solution  Runny Nose: Fluticasone nasal spray (Flonase) OR Mometasone nasal spray (Nasonex) OR Triamcinolone Acetonide nasal spray (Nasacort)- follow directions on the packaging  Pain/Pressure: Warm washcloth to the face  Sore Throat: Warm salt water gargles  If you have allergies, you may also consider taking an oral antihistamine (like Zyrtec or Claritin) as these may also help with your symptoms.  **Many medications will have more than one ingredient, be sure you are reading the packaging carefully and not taking more than one dose of the same kind of  medication at the same time or too close together. It is OK to use formulas that have all of the ingredients you want, but do not take them in a combined medication and as separate dose too close together. If you have any questions, the pharmacist will be happy to help you decide what is safe.

## 2022-02-23 NOTE — Telephone Encounter (Signed)
Pt moved and would like rx sent to another pharmacy.   Medication: atorvastatin (LIPITOR) 20 MG tablet  Has the patient contacted their pharmacy? Yes.     Preferred Pharmacy:   [facility] [address], [place], [place] [address]

## 2022-03-05 ENCOUNTER — Encounter: Payer: Self-pay | Admitting: Family Medicine

## 2022-03-06 ENCOUNTER — Other Ambulatory Visit: Payer: Self-pay | Admitting: Family Medicine

## 2022-04-06 ENCOUNTER — Telehealth: Payer: Self-pay | Admitting: Family Medicine

## 2022-04-06 NOTE — Telephone Encounter (Signed)
Sent medication list Originally medication list was on OV notes

## 2022-04-06 NOTE — Telephone Encounter (Signed)
Noah Lane called stating that the pt's med list was missing from the order that was sent to them regarding this pt.

## 2022-04-21 ENCOUNTER — Encounter: Payer: Self-pay | Admitting: Family Medicine

## 2022-04-21 ENCOUNTER — Other Ambulatory Visit: Payer: Self-pay | Admitting: Family Medicine

## 2022-04-21 DIAGNOSIS — E669 Obesity, unspecified: Secondary | ICD-10-CM

## 2022-04-21 DIAGNOSIS — E119 Type 2 diabetes mellitus without complications: Secondary | ICD-10-CM

## 2022-04-21 DIAGNOSIS — E782 Mixed hyperlipidemia: Secondary | ICD-10-CM

## 2022-04-22 ENCOUNTER — Other Ambulatory Visit (INDEPENDENT_AMBULATORY_CARE_PROVIDER_SITE_OTHER): Payer: Medicare Other

## 2022-04-22 DIAGNOSIS — E782 Mixed hyperlipidemia: Secondary | ICD-10-CM | POA: Diagnosis not present

## 2022-04-22 DIAGNOSIS — E669 Obesity, unspecified: Secondary | ICD-10-CM | POA: Diagnosis not present

## 2022-04-22 DIAGNOSIS — E119 Type 2 diabetes mellitus without complications: Secondary | ICD-10-CM

## 2022-04-22 DIAGNOSIS — E1169 Type 2 diabetes mellitus with other specified complication: Secondary | ICD-10-CM

## 2022-04-22 LAB — LIPID PANEL
Cholesterol: 209 mg/dL — ABNORMAL HIGH (ref 0–200)
HDL: 40.5 mg/dL (ref >39.00)
NonHDL: 168.84
Total CHOL/HDL Ratio: 5
Triglycerides: 222 mg/dL — ABNORMAL HIGH (ref 0.0–149.0)
VLDL: 44.4 mg/dL — ABNORMAL HIGH (ref 0.0–40.0)

## 2022-04-22 LAB — COMPREHENSIVE METABOLIC PANEL
ALT: 21 U/L (ref 0–53)
AST: 16 U/L (ref 0–37)
Albumin: 4.6 g/dL (ref 3.5–5.2)
Alkaline Phosphatase: 41 U/L (ref 39–117)
BUN: 22 mg/dL (ref 6–23)
CO2: 28 mEq/L (ref 19–32)
Calcium: 9.3 mg/dL (ref 8.4–10.5)
Chloride: 97 mEq/L (ref 96–112)
Creatinine, Ser: 1.1 mg/dL (ref 0.40–1.50)
GFR: 69.12 mL/min (ref >60.00)
Glucose, Bld: 200 mg/dL — ABNORMAL HIGH (ref 70–99)
Potassium: 4.2 mEq/L (ref 3.5–5.1)
Sodium: 133 mEq/L — ABNORMAL LOW (ref 135–145)
Total Bilirubin: 1.5 mg/dL — ABNORMAL HIGH (ref 0.2–1.2)
Total Protein: 6.7 g/dL (ref 6.0–8.3)

## 2022-04-22 LAB — HEMOGLOBIN A1C: Hgb A1c MFr Bld: 8.8 % — ABNORMAL HIGH (ref 4.6–6.5)

## 2022-04-22 LAB — CBC
HCT: 42.9 % (ref 39.0–52.0)
Hemoglobin: 14.7 g/dL (ref 13.0–17.0)
MCHC: 34.3 g/dL (ref 30.0–36.0)
MCV: 85 fl (ref 78.0–100.0)
Platelets: 156 10*3/uL (ref 150.0–400.0)
RBC: 5.05 Mil/uL (ref 4.22–5.81)
RDW: 13.9 % (ref 11.5–15.5)
WBC: 7.5 10*3/uL (ref 4.0–10.5)

## 2022-04-22 LAB — LDL CHOLESTEROL, DIRECT: Direct LDL: 132 mg/dL

## 2022-04-25 ENCOUNTER — Emergency Department (HOSPITAL_BASED_OUTPATIENT_CLINIC_OR_DEPARTMENT_OTHER): Payer: Medicare Other

## 2022-04-25 ENCOUNTER — Other Ambulatory Visit: Payer: Self-pay

## 2022-04-25 ENCOUNTER — Inpatient Hospital Stay (HOSPITAL_BASED_OUTPATIENT_CLINIC_OR_DEPARTMENT_OTHER)
Admission: EM | Admit: 2022-04-25 | Discharge: 2022-05-04 | DRG: 330 | Disposition: A | Discharge status: Home / Self Care | Payer: Medicare Other | Attending: Internal Medicine | Admitting: Internal Medicine

## 2022-04-25 ENCOUNTER — Encounter: Payer: Self-pay | Admitting: Family Medicine

## 2022-04-25 ENCOUNTER — Encounter (HOSPITAL_BASED_OUTPATIENT_CLINIC_OR_DEPARTMENT_OTHER): Payer: Self-pay | Admitting: Emergency Medicine

## 2022-04-25 DIAGNOSIS — C182 Malignant neoplasm of ascending colon: Secondary | ICD-10-CM | POA: Diagnosis present

## 2022-04-25 DIAGNOSIS — D72829 Elevated white blood cell count, unspecified: Secondary | ICD-10-CM | POA: Diagnosis present

## 2022-04-25 DIAGNOSIS — E782 Mixed hyperlipidemia: Secondary | ICD-10-CM | POA: Diagnosis present

## 2022-04-25 DIAGNOSIS — K56691 Other complete intestinal obstruction: Principal | ICD-10-CM | POA: Diagnosis present

## 2022-04-25 DIAGNOSIS — K5652 Intestinal adhesions [bands] with complete obstruction: Secondary | ICD-10-CM | POA: Diagnosis present

## 2022-04-25 DIAGNOSIS — K56601 Complete intestinal obstruction, unspecified as to cause: Secondary | ICD-10-CM

## 2022-04-25 DIAGNOSIS — E785 Hyperlipidemia, unspecified: Secondary | ICD-10-CM

## 2022-04-25 DIAGNOSIS — Z9989 Dependence on other enabling machines and devices: Secondary | ICD-10-CM

## 2022-04-25 DIAGNOSIS — K529 Noninfective gastroenteritis and colitis, unspecified: Secondary | ICD-10-CM | POA: Diagnosis present

## 2022-04-25 DIAGNOSIS — Z8249 Family history of ischemic heart disease and other diseases of the circulatory system: Secondary | ICD-10-CM | POA: Diagnosis not present

## 2022-04-25 DIAGNOSIS — Z807 Family history of other malignant neoplasms of lymphoid, hematopoietic and related tissues: Secondary | ICD-10-CM

## 2022-04-25 DIAGNOSIS — I10 Essential (primary) hypertension: Secondary | ICD-10-CM | POA: Diagnosis present

## 2022-04-25 DIAGNOSIS — Z833 Family history of diabetes mellitus: Secondary | ICD-10-CM | POA: Diagnosis not present

## 2022-04-25 DIAGNOSIS — E876 Hypokalemia: Secondary | ICD-10-CM

## 2022-04-25 DIAGNOSIS — C189 Malignant neoplasm of colon, unspecified: Secondary | ICD-10-CM | POA: Diagnosis not present

## 2022-04-25 DIAGNOSIS — K219 Gastro-esophageal reflux disease without esophagitis: Secondary | ICD-10-CM

## 2022-04-25 DIAGNOSIS — K9189 Other postprocedural complications and disorders of digestive system: Secondary | ICD-10-CM | POA: Diagnosis not present

## 2022-04-25 DIAGNOSIS — E119 Type 2 diabetes mellitus without complications: Secondary | ICD-10-CM | POA: Diagnosis not present

## 2022-04-25 DIAGNOSIS — F419 Anxiety disorder, unspecified: Secondary | ICD-10-CM | POA: Diagnosis present

## 2022-04-25 DIAGNOSIS — K567 Ileus, unspecified: Secondary | ICD-10-CM

## 2022-04-25 DIAGNOSIS — Z7984 Long term (current) use of oral hypoglycemic drugs: Secondary | ICD-10-CM | POA: Diagnosis not present

## 2022-04-25 DIAGNOSIS — Z87891 Personal history of nicotine dependence: Secondary | ICD-10-CM | POA: Diagnosis not present

## 2022-04-25 DIAGNOSIS — R030 Elevated blood-pressure reading, without diagnosis of hypertension: Secondary | ICD-10-CM | POA: Diagnosis not present

## 2022-04-25 DIAGNOSIS — E1169 Type 2 diabetes mellitus with other specified complication: Secondary | ICD-10-CM

## 2022-04-25 DIAGNOSIS — E1165 Type 2 diabetes mellitus with hyperglycemia: Secondary | ICD-10-CM | POA: Diagnosis not present

## 2022-04-25 DIAGNOSIS — E66811 Obesity, class 1: Secondary | ICD-10-CM | POA: Diagnosis present

## 2022-04-25 DIAGNOSIS — Z6834 Body mass index (BMI) 34.0-34.9, adult: Secondary | ICD-10-CM | POA: Diagnosis not present

## 2022-04-25 DIAGNOSIS — G4733 Obstructive sleep apnea (adult) (pediatric): Secondary | ICD-10-CM

## 2022-04-25 DIAGNOSIS — Z79899 Other long term (current) drug therapy: Secondary | ICD-10-CM | POA: Diagnosis not present

## 2022-04-25 DIAGNOSIS — K56609 Unspecified intestinal obstruction, unspecified as to partial versus complete obstruction: Secondary | ICD-10-CM | POA: Diagnosis not present

## 2022-04-25 DIAGNOSIS — E669 Obesity, unspecified: Secondary | ICD-10-CM | POA: Diagnosis not present

## 2022-04-25 DIAGNOSIS — E871 Hypo-osmolality and hyponatremia: Secondary | ICD-10-CM | POA: Diagnosis not present

## 2022-04-25 DIAGNOSIS — R933 Abnormal findings on diagnostic imaging of other parts of digestive tract: Secondary | ICD-10-CM | POA: Diagnosis not present

## 2022-04-25 LAB — CBC WITH DIFFERENTIAL/PLATELET
Abs Immature Granulocytes: 0.04 10*3/uL (ref 0.00–0.07)
Basophils Absolute: 0.1 10*3/uL (ref 0.0–0.1)
Basophils Relative: 0 %
Eosinophils Absolute: 0.1 10*3/uL (ref 0.0–0.5)
Eosinophils Relative: 1 %
HCT: 44 % (ref 39.0–52.0)
Hemoglobin: 15 g/dL (ref 13.0–17.0)
Immature Granulocytes: 0 %
Lymphocytes Relative: 10 %
Lymphs Abs: 1.2 10*3/uL (ref 0.7–4.0)
MCH: 28.3 pg (ref 26.0–34.0)
MCHC: 34.1 g/dL (ref 30.0–36.0)
MCV: 83 fL (ref 80.0–100.0)
Monocytes Absolute: 0.7 10*3/uL (ref 0.1–1.0)
Monocytes Relative: 6 %
Neutro Abs: 9.4 10*3/uL — ABNORMAL HIGH (ref 1.7–7.7)
Neutrophils Relative %: 83 %
Platelets: 178 10*3/uL (ref 150–400)
RBC: 5.3 MIL/uL (ref 4.22–5.81)
RDW: 12.8 % (ref 11.5–15.5)
WBC: 11.5 10*3/uL — ABNORMAL HIGH (ref 4.0–10.5)
nRBC: 0 % (ref 0.0–0.2)

## 2022-04-25 LAB — COMPREHENSIVE METABOLIC PANEL
ALT: 24 U/L (ref 0–44)
AST: 22 U/L (ref 15–41)
Albumin: 4.3 g/dL (ref 3.5–5.0)
Alkaline Phosphatase: 44 U/L (ref 38–126)
Anion gap: 9 (ref 5–15)
BUN: 17 mg/dL (ref 8–23)
CO2: 23 mmol/L (ref 22–32)
Calcium: 8.8 mg/dL — ABNORMAL LOW (ref 8.9–10.3)
Chloride: 101 mmol/L (ref 98–111)
Creatinine, Ser: 1.05 mg/dL (ref 0.61–1.24)
GFR, Estimated: >60 mL/min (ref >60)
Glucose, Bld: 182 mg/dL — ABNORMAL HIGH (ref 70–99)
Potassium: 4 mmol/L (ref 3.5–5.1)
Sodium: 133 mmol/L — ABNORMAL LOW (ref 135–145)
Total Bilirubin: 1.9 mg/dL — ABNORMAL HIGH (ref 0.3–1.2)
Total Protein: 7.3 g/dL (ref 6.5–8.1)

## 2022-04-25 LAB — URINALYSIS, ROUTINE W REFLEX MICROSCOPIC
Bilirubin Urine: NEGATIVE
Glucose, UA: NEGATIVE mg/dL
Hgb urine dipstick: NEGATIVE
Ketones, ur: NEGATIVE mg/dL
Leukocytes,Ua: NEGATIVE
Nitrite: NEGATIVE
Protein, ur: NEGATIVE mg/dL
Specific Gravity, Urine: <=1.005 (ref 1.005–1.030)
pH: 5.5 (ref 5.0–8.0)

## 2022-04-25 LAB — LIPASE, BLOOD: Lipase: 30 U/L (ref 11–51)

## 2022-04-25 MED ORDER — LIDOCAINE HCL URETHRAL/MUCOSAL 2 % EX GEL
1.0000 | Freq: Once | CUTANEOUS | Status: AC
  Administered 2022-04-25: 1 via TOPICAL
  Filled 2022-04-25: qty 11

## 2022-04-25 MED ORDER — IOHEXOL 300 MG/ML  SOLN
100.0000 mL | Freq: Once | INTRAMUSCULAR | Status: AC | PRN
  Administered 2022-04-25: 100 mL via INTRAVENOUS

## 2022-04-25 MED ORDER — SODIUM CHLORIDE 0.9 % IV BOLUS
1000.0000 mL | Freq: Once | INTRAVENOUS | Status: AC
  Administered 2022-04-25: 1000 mL via INTRAVENOUS

## 2022-04-25 MED ORDER — PHENOL 1.4 % MT LIQD
1.0000 | OROMUCOSAL | Status: DC | PRN
  Administered 2022-04-25: 1 via OROMUCOSAL
  Filled 2022-04-25: qty 177

## 2022-04-25 MED ORDER — SODIUM CHLORIDE 0.9 % IV SOLN: INTRAVENOUS | Status: AC

## 2022-04-25 MED ORDER — SODIUM CHLORIDE 0.9 % IV SOLN: Freq: Once | INTRAVENOUS | Status: AC

## 2022-04-25 MED ORDER — MORPHINE SULFATE (PF) 2 MG/ML IV SOLN
2.0000 mg | INTRAVENOUS | Status: DC | PRN
  Administered 2022-04-25 – 2022-04-27 (×6): 2 mg via INTRAVENOUS
  Filled 2022-04-25 (×6): qty 1

## 2022-04-25 NOTE — ED Triage Notes (Signed)
Patient c/o lower abdominal pain starting at midnight. Patient denies nausea but did try to force himself to vomit.

## 2022-04-25 NOTE — Assessment & Plan Note (Signed)
-  A1C of 8.8 -Will place on sensitive SSI

## 2022-04-25 NOTE — ED Provider Notes (Signed)
[facility] Provider Note   CSN: [id number] Arrival date & time: 04/25/22  1131     History  Chief Complaint  Patient presents with   Abdominal Pain    Noah Lane is a 67 y.o. male.  Patient here with lower abdominal pain mostly right-sided.  History of diabetes and high cholesterol.  Nothing makes it worse or better.  Still has his appendix and no other major abdominal surgeries.  Does not have any pain with urination.  No blood in the urine.  Has felt nauseous but no vomiting.  Denies any chest pain, weakness, numbness, chills.  The history is provided by the patient.       Home Medications Prior to Admission medications   Medication Sig Start Date End Date Taking? Authorizing Provider  atorvastatin (LIPITOR) 20 MG tablet Take 1 tablet (20 mg total) by mouth daily. 02/23/22   [name], DO  metFORMIN (GLUCOPHAGE-XR) 500 MG 24 hr tablet TAKE ONE TABLET BY MOUTH ONE TIME DAILY WITH BREAKFAST 03/08/22   [name], DO  nitroGLYCERIN (NITRODUR - DOSED IN MG/24 HR) 0.2 mg/hr patch Cut and apply 1/4 patch to most painful area q24h. 06/17/21   Rosemarie Ax, MD      Allergies    Patient has no known allergies.    Review of Systems   Review of Systems  Physical Exam Updated Vital Signs BP (!) 156/97 (BP Location: Right Arm)   Pulse 77   Temp 98.4 F (36.9 C) (Oral)   Resp 18   Ht '6\' 1"'$  (1.854 m)   Wt 117 kg   SpO2 100%   BMI 34.04 kg/m  Physical Exam Vitals and nursing note reviewed.  Constitutional:      General: He is not in acute distress.    Appearance: He is well-developed. He is not ill-appearing.  HENT:     Head: Normocephalic and atraumatic.  Eyes:     Extraocular Movements: Extraocular movements intact.     Conjunctiva/sclera: Conjunctivae normal.     Pupils: Pupils are equal, round, and reactive to light.  Cardiovascular:     Rate and Rhythm: Normal rate and regular rhythm.      Heart sounds: Normal heart sounds. No murmur heard. Pulmonary:     Effort: Pulmonary effort is normal. No respiratory distress.     Breath sounds: Normal breath sounds.  Abdominal:     Palpations: Abdomen is soft.     Tenderness: There is abdominal tenderness in the right lower quadrant and suprapubic area.  Musculoskeletal:        General: No swelling.     Cervical back: Neck supple.  Skin:    General: Skin is warm and dry.     Capillary Refill: Capillary refill takes less than 2 seconds.  Neurological:     Mental Status: He is alert.  Psychiatric:        Mood and Affect: Mood normal.     ED Results / Procedures / Treatments   Labs (all labs ordered are listed, but only abnormal results are displayed) Labs Reviewed  CBC WITH DIFFERENTIAL/PLATELET - Abnormal; Notable for the following components:      Result Value   WBC 11.5 (*)    Neutro Abs 9.4 (*)    All other components within normal limits  COMPREHENSIVE METABOLIC PANEL - Abnormal; Notable for the following components:   Sodium 133 (*)    Glucose, Bld 182 (*)  Calcium 8.8 (*)    Total Bilirubin 1.9 (*)    All other components within normal limits  LIPASE, BLOOD  URINALYSIS, ROUTINE W REFLEX MICROSCOPIC    EKG None  Radiology No results found.  Procedures Procedures    Medications Ordered in ED Medications  lidocaine (XYLOCAINE) 2 % jelly 1 Application (has no administration in time range)  sodium chloride 0.9 % bolus 1,000 mL (0 mLs Intravenous Stopped 04/25/22 1343)  iohexol (OMNIPAQUE) 300 MG/ML solution 100 mL (100 mLs Intravenous Contrast Given 04/25/22 1305)    ED Course/ Medical Decision Making/ A&P                             Medical Decision Making Amount and/or Complexity of Data Reviewed Labs: ordered. Radiology: ordered.  Risk Prescription drug management. Decision regarding hospitalization.   Noah Lane is here with abdominal pain.  History of diabetes and high  cholesterol.  Differential diagnosis appendicitis versus kidney stone versus less likely bowel obstruction, colitis, could be constipation.  Will get CBC, CMP, lipase, urinalysis, CT scan abdomen pelvis.  Will give IV fluid bolus.  Per my review and interpretation the labs is no significant anemia or electrolyte abnormality or kidney injury or leukocytosis.  Patient with CT scan per radiology report with colonic obstruction.  Thought to be from neoplasm.  May be a fair amount of bowel thickening and inflammation he may have symptoms to coral colitis.  But there is no bowel perforation but concerned that this could lead to that.  Talked with Dr. Marlou Starks with surgery.  We will admit to medicine.  Will likely need general surgery consultation and GI consultation upon arrival to the hospital bed.  Will place NG tube.  This chart was dictated using voice recognition software.  Despite best efforts to proofread,  errors can occur which can change the documentation meaning.         Final Clinical Impression(s) / ED Diagnoses Final diagnoses:  Complete intestinal obstruction, unspecified cause [facility])    Rx / DC Orders ED Discharge Orders     None         Lennice Sites, DO 04/25/22 1415

## 2022-04-25 NOTE — Assessment & Plan Note (Signed)
-  pt uses nasal pillow CPAP and is adamant that he needs it for sleep. Will have RT trial him on it with NG tube in place.

## 2022-04-25 NOTE — Assessment & Plan Note (Signed)
-  Colonic obstruction at the level of the hepatic flexure suspicious for colonic neoplasm.  There is also signs of colitis associated with obstruction with marked distention of the cecum.  Ascending colon dilated to a centimeter and associated with surrounding stranding and trace fluid.  Findings concerning for stercoral colitis associated with colonic obstruction.  Colon may be at risk for perforation. -NG tube in place to low intermittent suction -General surgery Dr. Marlou Starks consulted and will see  -Message sent to [facility] to consult in the morning

## 2022-04-25 NOTE — ED Notes (Signed)
[facility] is on the unit to transport the patient, paperwork provided, report given, v/u

## 2022-04-25 NOTE — ED Notes (Signed)
Called [facility] for transport at 2:31.

## 2022-04-25 NOTE — Consult Note (Signed)
Reason for Consult:abd pain Referring Physician: Dr. Erik Obey Noah Lane is an 67 y.o. male.  HPI: The patient is a 67 year old white male who was in his normal state of health until yesterday when he developed some abdominal pain on the right with some distention.  He was having normal large bowel movements up until today.  He denies any nausea or vomiting.  He went to his medical doctor who obtained a CT scan which found a near obstructing mass at the hepatic flexure that is worrisome for a colon cancer.  There was a lot of stool proximal to this area of near obstruction.  There was no evidence of perforation.  He has type 2 diabetes and is overweight.  He does not smoke.  Past Medical History:  Diagnosis Date   Diabetes mellitus type 2 in obese [facility])    Mixed hyperlipidemia     Past Surgical History:  Procedure Laterality Date   HERNIA REPAIR  2012    Family History  Problem Relation Age of Onset   Heart disease Mother    Diabetes Father    Cancer Father        post age of 98    Social History:  reports that he quit smoking about 31 years ago. His smoking use included cigarettes. He started smoking about 46 years ago. He smoked an average of .5 packs per day. He has never used smokeless tobacco. He reports current alcohol use. He reports that he does not use drugs.  Allergies: No Known Allergies  Medications: I have reviewed the patient's current medications.  Results for orders placed or performed during the hospital encounter of 04/25/22 (from the past 48 hour(s))  CBC with Differential     Status: Abnormal   Collection Time: 04/25/22 12:23 PM  Result Value Ref Range   WBC 11.5 (H) 4.0 - 10.5 K/uL   RBC 5.30 4.22 - 5.81 MIL/uL   Hemoglobin 15.0 13.0 - 17.0 g/dL   HCT 44.0 39.0 - 52.0 %   MCV 83.0 80.0 - 100.0 fL   MCH 28.3 26.0 - 34.0 pg   MCHC 34.1 30.0 - 36.0 g/dL   RDW 12.8 11.5 - 15.5 %   Platelets 178 150 - 400 K/uL   nRBC 0.0 0.0 - 0.2 %   Neutrophils Relative  % 83 %   Neutro Abs 9.4 (H) 1.7 - 7.7 K/uL   Lymphocytes Relative 10 %   Lymphs Abs 1.2 0.7 - 4.0 K/uL   Monocytes Relative 6 %   Monocytes Absolute 0.7 0.1 - 1.0 K/uL   Eosinophils Relative 1 %   Eosinophils Absolute 0.1 0.0 - 0.5 K/uL   Basophils Relative 0 %   Basophils Absolute 0.1 0.0 - 0.1 K/uL   Immature Granulocytes 0 %   Abs Immature Granulocytes 0.04 0.00 - 0.07 K/uL    Comment: Performed at [facility], [address] [place], [place] [address]  Comprehensive metabolic panel     Status: Abnormal   Collection Time: 04/25/22 12:23 PM  Result Value Ref Range   Sodium 133 (L) 135 - 145 mmol/L   Potassium 4.0 3.5 - 5.1 mmol/L   Chloride 101 98 - 111 mmol/L   CO2 23 22 - 32 mmol/L   Glucose, Bld 182 (H) 70 - 99 mg/dL    Comment: Glucose reference range applies only to samples taken after fasting for at least 8 hours.   BUN 17 8 - 23 mg/dL  Creatinine, Ser 1.05 0.61 - 1.24 mg/dL   Calcium 8.8 (L) 8.9 - 10.3 mg/dL   Total Protein 7.3 6.5 - 8.1 g/dL   Albumin 4.3 3.5 - 5.0 g/dL   AST 22 15 - 41 U/L   ALT 24 0 - 44 U/L   Alkaline Phosphatase 44 38 - 126 U/L   Total Bilirubin 1.9 (H) 0.3 - 1.2 mg/dL   GFR, Estimated >60 >60 mL/min    Comment: (NOTE) Calculated using the CKD-EPI Creatinine Equation (2021)    Anion gap 9 5 - 15    Comment: Performed at [facility], [address] [place], [place] [address]  Lipase, blood     Status: None   Collection Time: 04/25/22 12:23 PM  Result Value Ref Range   Lipase 30 11 - 51 U/L    Comment: Performed at [facility], [address] [place], [place] [address]  Urinalysis, Routine w reflex microscopic -Urine, Clean Catch     Status: None   Collection Time: 04/25/22 12:23 PM  Result Value Ref Range   Color, Urine YELLOW YELLOW   APPearance CLEAR CLEAR   Specific Gravity, Urine <=1.005 1.005 - 1.030   pH 5.5 5.0 - 8.0   Glucose, UA NEGATIVE NEGATIVE mg/dL   Hgb urine dipstick NEGATIVE  NEGATIVE   Bilirubin Urine NEGATIVE NEGATIVE   Ketones, ur NEGATIVE NEGATIVE mg/dL   Protein, ur NEGATIVE NEGATIVE mg/dL   Nitrite NEGATIVE NEGATIVE   Leukocytes,Ua NEGATIVE NEGATIVE    Comment: Microscopic not done on urines with negative protein, blood, leukocytes, nitrite, or glucose < 500 mg/dL. Performed at [facility], [address] [place], [place] [address]     No results found.  Review of Systems  Constitutional: Negative.   HENT: Negative.    Eyes: Negative.   Respiratory: Negative.    Cardiovascular: Negative.   Gastrointestinal:  Positive for abdominal pain. Negative for vomiting.  Endocrine: Negative.   Genitourinary: Negative.   Musculoskeletal: Negative.   Skin: Negative.   Allergic/Immunologic: Negative.   Neurological: Negative.   Hematological: Negative.   Psychiatric/Behavioral: Negative.     Blood pressure (!) 185/85, pulse 64, temperature 98.4 F (36.9 C), temperature source Oral, resp. rate 18, height '6\' 1"'$  (1.854 m), weight 117 kg, SpO2 100 %. Physical Exam Vitals reviewed.  Constitutional:      General: He is not in acute distress.    Appearance: Normal appearance.  HENT:     Head: Normocephalic and atraumatic.     Right Ear: External ear normal.     Left Ear: External ear normal.     Nose: Nose normal.     Mouth/Throat:     Mouth: Mucous membranes are dry.     Pharynx: Oropharynx is clear.  Eyes:     General: No scleral icterus.    Extraocular Movements: Extraocular movements intact.     Conjunctiva/sclera: Conjunctivae normal.     Pupils: Pupils are equal, round, and reactive to light.  Cardiovascular:     Rate and Rhythm: Normal rate and regular rhythm.     Pulses: Normal pulses.     Heart sounds: Normal heart sounds.  Pulmonary:     Effort: Pulmonary effort is normal. No respiratory distress.     Breath sounds: Normal breath sounds.  Abdominal:     Palpations: Abdomen is soft.     Comments: The left side of the  abdomen is soft and nontender.  There  is mild tenderness with some distention on the right side of the abdomen.  Musculoskeletal:        General: No swelling or deformity. Normal range of motion.     Cervical back: Normal range of motion and neck supple.  Skin:    General: Skin is warm and dry.     Coloration: Skin is not jaundiced.  Neurological:     General: No focal deficit present.     Mental Status: He is alert and oriented to person, place, and time.  Psychiatric:        Mood and Affect: Mood normal.        Behavior: Behavior normal.     Assessment/Plan: The patient appears to have a near obstructing mass at the hepatic flexure of the colon.  This is very worrisome for a colon cancer.  He will need a GI evaluation to see if this could be biopsied or stented.  If not then he may require surgery in the next couple days for resection of the mass to relieve the obstruction.  It is possible that he could require either a colostomy or ileostomy after the surgery.  We will consult with gastroenterology and talk to the primary surgical team in the morning.  Noah Lane 04/25/2022, 7:46 PM

## 2022-04-25 NOTE — Assessment & Plan Note (Signed)
-  Mild at 133 -has received 1L NS bolus, will keep on continuous IV fluid and follow Na in the morning

## 2022-04-25 NOTE — H&P (Signed)
History and Physical    Patient: Noah Lane [id number] DOB: 10/12/1955 DOA: 04/25/2022 DOS: the patient was seen and examined on 04/25/2022 PCP: [name], DO  Patient coming from:  [place]  Chief Complaint:  Chief Complaint  Patient presents with   Abdominal Pain   HPI: Noah Lane is a 67 y.o. male with medical history significant of T2DM, HLD, OSA on CPAP, and obesity who presents as a transfer from outside ED for colonic obstruction requiring GI and general surgery consultation.   Abdominal pain started midnight to right lower quadrant. Tried to vomit and have bowel movement without relieve and decided to go to ED. Has hx of umbilical and inguinal hernia years ago. No weight loss but has actually gained weight due to diet. Had colonoscopy probably 10 years ago in [place] and reports benign polyps that were removed.  No tobacco use. No family hx of colon cancer.  In the ED, he was afebrile and normotensive BP elevated up to 185/85.  Mild leukocytosis of 11.5, hemoglobin of 15.  Mild hyponatremia of 133, K of 4, creatinine 1.05, CBG of 182.  Colonic obstruction at the level of the hepatic flexure suspicious for colonic neoplasm.  There is also signs of colitis associated with obstruction with marked distention of the cecum.  Ascending colon dilated to a centimeter and associated with surrounding stranding and trace fluid.  Findings concerning for stercoral colitis associated with colonic obstruction.  Colon may be at risk for perforation.  EDP consulted general surgery Dr. Marlou Starks and he will see in consultation. Likely will need GI consultation as well. NG tube was placed.   Review of Systems: As mentioned in the history of present illness. All other systems reviewed and are negative. Past Medical History:  Diagnosis Date   Diabetes mellitus type 2 in obese [facility])    Mixed hyperlipidemia    Past Surgical History:  Procedure Laterality Date   HERNIA  REPAIR  2012   Social History:  reports that he quit smoking about 31 years ago. His smoking use included cigarettes. He started smoking about 46 years ago. He smoked an average of .5 packs per day. He has never used smokeless tobacco. He reports current alcohol use. He reports that he does not use drugs.  No Known Allergies  Family History  Problem Relation Age of Onset   Heart disease Mother    Diabetes Father    Cancer Father        post age of 74  Father-also had multiple myeloma  Prior to Admission medications   Medication Sig Start Date End Date Taking? Authorizing Provider  atorvastatin (LIPITOR) 20 MG tablet Take 1 tablet (20 mg total) by mouth daily. 02/23/22   [name], DO  metFORMIN (GLUCOPHAGE-XR) 500 MG 24 hr tablet TAKE ONE TABLET BY MOUTH ONE TIME DAILY WITH BREAKFAST 03/08/22   [name], DO  nitroGLYCERIN (NITRODUR - DOSED IN MG/24 HR) 0.2 mg/hr patch Cut and apply 1/4 patch to most painful area q24h. 06/17/21   Rosemarie Ax, MD    Physical Exam: Vitals:   04/25/22 1245 04/25/22 1518 04/25/22 1655 04/25/22 1805  BP: (!) 158/78 (!) 184/82 134/69 (!) 185/85  Pulse: 65 71 71 64  Resp: '18 18 17 18  '$ Temp:  99.1 F (37.3 C)  98.4 F (36.9 C)  TempSrc:  Oral  Oral  SpO2: 98% 96% 96% 100%  Weight:      Height:  Constitutional: NAD, calm, comfortable, Obese male laying in bed Eyes: lids and conjunctivae normal ENMT: Mucous membranes are moist. NG tube in place.  Neck: normal, supple Respiratory: clear to auscultation bilaterally, no wheezing, no crackles. Normal respiratory effort. No accessory muscle use.  Cardiovascular: Regular rate and rhythm, no murmurs / rubs / gallops. No extremity edema.  Abdomen: soft, no-distended, mildly tender to mid-right quadrant. No rebound tenderness, guarding or rigidity.  Musculoskeletal: no clubbing / cyanosis. No joint deformity upper and lower extremities.  Normal muscle tone.  Skin: no  rashes, lesions, ulcers.  Neurologic: CN 2-12 grossly intact.  Psychiatric: Normal judgment and insight. Alert and oriented x 3. Normal mood. Data Reviewed:  See HPI  Assessment and Plan: * Colonic obstruction ([place]) -Colonic obstruction at the level of the hepatic flexure suspicious for colonic neoplasm.  There is also signs of colitis associated with obstruction with marked distention of the cecum.  Ascending colon dilated to a centimeter and associated with surrounding stranding and trace fluid.  Findings concerning for stercoral colitis associated with colonic obstruction.  Colon may be at risk for perforation. -NG tube in place to low intermittent suction -General surgery Dr. Marlou Starks consulted and will see  -Message sent to [facility] to consult in the morning   Hyponatremia -Mild at 133 -has received 1L NS bolus, will keep on continuous IV fluid and follow Na in the morning  OSA on CPAP -pt uses nasal pillow CPAP and is adamant that he needs it for sleep. Will have RT trial him on it with NG tube in place.  Diabetes mellitus type 2 in obese (HCC) -A1C of 8.8 -Will place on sensitive SSI      Advance Care Planning: Full  Consults: General surgery  Family Communication: none at bedside  Severity of Illness: The appropriate patient status for this patient is INPATIENT. Inpatient status is judged to be reasonable and necessary in order to provide the required intensity of service to ensure the patient's safety. The patient's presenting symptoms, physical exam findings, and initial radiographic and laboratory data in the context of their chronic comorbidities is felt to place them at high risk for further clinical deterioration. Furthermore, it is not anticipated that the patient will be medically stable for discharge from the hospital within 2 midnights of admission.   * I certify that at the point of admission it is my clinical judgment that the patient will require inpatient  hospital care spanning beyond 2 midnights from the point of admission due to high intensity of service, high risk for further deterioration and high frequency of surveillance required.*  Author: Orene Desanctis, DO 04/25/2022 7:33 PM  For on call review [email or web address].

## 2022-04-26 ENCOUNTER — Telehealth: Payer: Self-pay | Admitting: *Deleted

## 2022-04-26 DIAGNOSIS — R933 Abnormal findings on diagnostic imaging of other parts of digestive tract: Secondary | ICD-10-CM | POA: Diagnosis not present

## 2022-04-26 DIAGNOSIS — K56609 Unspecified intestinal obstruction, unspecified as to partial versus complete obstruction: Secondary | ICD-10-CM | POA: Diagnosis not present

## 2022-04-26 LAB — BASIC METABOLIC PANEL
Anion gap: 7 (ref 5–15)
BUN: 16 mg/dL (ref 8–23)
CO2: 27 mmol/L (ref 22–32)
Calcium: 8.3 mg/dL — ABNORMAL LOW (ref 8.9–10.3)
Chloride: 101 mmol/L (ref 98–111)
Creatinine, Ser: 1.09 mg/dL (ref 0.61–1.24)
GFR, Estimated: >60 mL/min (ref >60)
Glucose, Bld: 169 mg/dL — ABNORMAL HIGH (ref 70–99)
Potassium: 3.7 mmol/L (ref 3.5–5.1)
Sodium: 135 mmol/L (ref 135–145)

## 2022-04-26 LAB — CBC
HCT: 41 % (ref 39.0–52.0)
Hemoglobin: 13.8 g/dL (ref 13.0–17.0)
MCH: 28.8 pg (ref 26.0–34.0)
MCHC: 33.7 g/dL (ref 30.0–36.0)
MCV: 85.6 fL (ref 80.0–100.0)
Platelets: 143 10*3/uL — ABNORMAL LOW (ref 150–400)
RBC: 4.79 MIL/uL (ref 4.22–5.81)
RDW: 13 % (ref 11.5–15.5)
WBC: 9.4 10*3/uL (ref 4.0–10.5)
nRBC: 0 % (ref 0.0–0.2)

## 2022-04-26 MED ORDER — HYDROXYZINE HCL 10 MG PO TABS: 10.0000 mg | ORAL_TABLET | Freq: Once | ORAL | Status: DC | PRN

## 2022-04-26 MED ORDER — MORPHINE SULFATE (PF) 2 MG/ML IV SOLN
1.0000 mg | Freq: Once | INTRAVENOUS | Status: AC | PRN
  Administered 2022-04-26: 1 mg via INTRAVENOUS
  Filled 2022-04-26: qty 1

## 2022-04-26 MED ORDER — SODIUM CHLORIDE 0.9 % IV SOLN: INTRAVENOUS | Status: AC

## 2022-04-26 MED ORDER — INSULIN ASPART 100 UNIT/ML IJ SOLN
0.0000 [IU] | Freq: Three times a day (TID) | INTRAMUSCULAR | Status: DC
  Administered 2022-04-27 – 2022-04-28 (×3): 3 [IU] via SUBCUTANEOUS
  Administered 2022-04-28: 2 [IU] via SUBCUTANEOUS
  Administered 2022-04-28: 5 [IU] via SUBCUTANEOUS
  Administered 2022-04-29 (×3): 2 [IU] via SUBCUTANEOUS
  Administered 2022-04-30: 1 [IU] via SUBCUTANEOUS
  Administered 2022-04-30 – 2022-05-02 (×6): 2 [IU] via SUBCUTANEOUS
  Administered 2022-05-02: 1 [IU] via SUBCUTANEOUS
  Administered 2022-05-02 – 2022-05-03 (×2): 2 [IU] via SUBCUTANEOUS
  Administered 2022-05-03: 1 [IU] via SUBCUTANEOUS
  Administered 2022-05-03 – 2022-05-04 (×2): 3 [IU] via SUBCUTANEOUS
  Administered 2022-05-04: 2 [IU] via SUBCUTANEOUS

## 2022-04-26 MED ORDER — INSULIN ASPART 100 UNIT/ML IJ SOLN
0.0000 [IU] | Freq: Every day | INTRAMUSCULAR | Status: DC
  Administered 2022-04-27: 2 [IU] via SUBCUTANEOUS

## 2022-04-26 NOTE — Telephone Encounter (Signed)
Who Is Calling Patient / Member / Family / Caregiver Call Type Triage / Clinical Relationship To Patient Self Return Phone Number [phone] (Primary) Chief Complaint Abdominal Pain Reason for Call Symptomatic / Request for Health Information Initial Comment Caller states he has abdominal on the right side. It is constant and he states it is very rough. He is still able to stand. [facility] ED. Translation No Nurse Assessment Nurse: Lennice Sites, RN, Brandi Date/Time (Eastern Time): 04/25/2022 10:19:59 AM Confirm and document reason for call. If symptomatic, describe symptoms. ---Pt reports lower right abdominal pain that started last night. Restless night. Reports he had a sm bowel movement and vomit x1 this am. Last normal bm 04/24/22. On hand meds at home Gas x miralax   Comments User: Renard Hamper, RN Date/Time Eilene Ghazi Time): 04/25/2022 10:25:48 AM Pt reports pain worse when laying down. User: Renard Hamper, RN Date/Time Eilene Ghazi Time): 04/25/2022 10:27:35 AM Laying down pain is a 7(0-10) Constant User: Renard Hamper, RN Date/Time Eilene Ghazi Time): 04/25/2022 10:33:12 AM PT states he took prostaglandin injection yesterday Apple cider vinegar water taken yesterday; for gas Referrals GO TO FACILITY OTHER - SPECIFY

## 2022-04-26 NOTE — Consult Note (Addendum)
Consultation  Referring Provider:   Dr. Zigmund Daniel  Primary Care Physician:  [name], DO Primary Gastroenterologist: [facility] - No GI care in the area per patient Reason for Consultation:  Colon obstruction            HPI:   Noah Lane is a 67 y.o. male with a past medical history as listed below including type 2 diabetes, hyperlipidemia, OSA on CPAP and obesity, who was transferred from an outside ED to [facility] on 04/25/2022 for colonic obstruction requiring GI and general surgery consultation.    At time of admission patient described abdominal pain starting around midnight to the right lower quadrant, apparently tried to vomit and have bowel movement without relief and decided to go to the ED.  Has a history of umbilical and inguinal hernia years ago.     Today, the patient tells me he is trying to remain in good spirits.  Apparently he just got off the phone with a family member of his who is a doctor.  They told him to ask about his liver and if there were any lymph nodes on CT.  He tells me that he started with abdominal pain on the evening of 04/24/2022 that was quite severe in the right lower quadrant on the right side of his abdomen, he tried to vomit and have a bowel movement but nothing helped so he went to the ED.      Since then he has had imaging showing a left obstructing mass.  He has NG tube in place which is helping with some of his nausea.  He is not passing gas and has not had a bowel movement.    I did ask patient about GI care in the area but he tells me he has not seen anyone, apparently someone reviewed records for him and told him he was not due for a colonoscopy, but he tells me he was not seen in their clinic.    Denies fever, chills or weight loss.  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 and associated with colonic obstruction, colon may be at risk for perforation  GI history: Colonoscopy 10 years ago in [place]-do not have report patient reports benign polyps  Past Medical History:  Diagnosis Date   Diabetes mellitus type 2 in obese ([place])    Mixed hyperlipidemia     Past Surgical History:  Procedure Laterality Date   HERNIA REPAIR  2012    Family History  Problem Relation Age of Onset   Heart disease Mother    Diabetes Father    Cancer Father        post age of 34   Social History   Tobacco Use   Smoking status: Former    Packs/day: 0.50    Types: Cigarettes    Start date: 61    Quit date: 1993    Years since quitting: 31.1   Smokeless tobacco: Never  Vaping Use   Vaping Use: Never used  Substance Use Topics   Alcohol use: Yes    Comment: occasional   Drug use: Never  Prior to Admission medications   Medication Sig Start Date End Date Taking? Authorizing Provider  atorvastatin (LIPITOR) 20 MG tablet Take 1 tablet (20 mg total) by mouth daily. 02/23/22   [name], DO  metFORMIN (GLUCOPHAGE-XR) 500 MG 24 hr tablet TAKE ONE TABLET BY MOUTH ONE TIME DAILY WITH BREAKFAST 03/08/22   [name], DO  nitroGLYCERIN (NITRODUR - DOSED IN MG/24 HR) 0.2 mg/hr patch Cut and apply 1/4 patch to most painful area q24h. 06/17/21   Rosemarie Ax, MD    Current Facility-Administered Medications  Medication Dose Route Frequency Provider Last Rate Last Admin   morphine (PF) 2 MG/ML injection 2 mg  2 mg Intravenous Q3H PRN Tu, Ching T, DO   2 mg at 04/26/22 0533   phenol (CHLORASEPTIC) mouth spray 1 spray  1 spray Mouth/Throat PRN Tu, Ching T, DO    1 spray at 04/25/22 1958    Allergies as of 04/25/2022   (No Known Allergies)     Review of Systems:    Constitutional: No weight loss, fever or chills Skin: No rash Cardiovascular: No chest pain   Respiratory: No SOB  Gastrointestinal: See HPI and otherwise negative Genitourinary: No dysuria  Neurological: No headache, dizziness or syncope Musculoskeletal: No new muscle or joint pain Hematologic: No bleeding  Psychiatric: No history of depression or anxiety    Physical Exam:  Vital signs in last 24 hours: Temp:  [97.8 F (36.6 C)-99.1 F (37.3 C)] 97.8 F (36.6 C) (02/26 0416) Pulse Rate:  [64-77] 73 (02/26 0416) Resp:  [16-18] 16 (02/26 0416) BP: (134-185)/(69-97) 147/73 (02/26 0416) SpO2:  [93 %-100 %] 93 % (02/26 0416) FiO2 (%):  [21 %] 21 % (02/25 2155) Weight:  [117 kg] 117 kg (02/25 1145) Last BM Date : 04/24/22 General:   Pleasant Caucasian male appears to be in NAD, Well developed, Well nourished, alert and cooperative Head:  Normocephalic and atraumatic.+NG tube in place to wall with low intermittent suction of a bilious material, about 50 cc so far Eyes:   PEERL, EOMI. No icterus. Conjunctiva pink. Ears:  Normal auditory acuity. Neck:  Supple Throat: Oral cavity and pharynx without inflammation, swelling or lesion.  Lungs: Respirations even and unlabored. Lungs clear to auscultation bilaterally.   No wheezes, crackles, or rhonchi.  Heart: Normal S1, S2. No MRG. Regular rate and rhythm. No peripheral edema, cyanosis or pallor.  Abdomen:  Soft, mild to moderate distention, moderate to marked TTP on the right side of his abdomen with involuntary guarding, decreased bowel sounds all 4 quadrants Rectal:  Not performed.  Msk:  Symmetrical without gross deformities. Peripheral pulses intact.  Extremities:  Without edema, no deformity or joint abnormality. Neurologic:  Alert and  oriented x4;  grossly normal neurologically.   Skin:   Dry and intact without significant  lesions or rashes. Psychiatric: Demonstrates good judgement and reason without abnormal affect or behaviors.   LAB RESULTS: Recent Labs    04/25/22 1223 04/26/22 0404  WBC 11.5* 9.4  HGB 15.0 13.8  HCT 44.0 41.0  PLT 178 143*   BMET Recent Labs    04/25/22 1223 04/26/22 0404  NA 133* 135  K 4.0 3.7  CL 101 101  CO2 23 27  GLUCOSE 182* 169*  BUN 17 16  CREATININE 1.05 1.09  CALCIUM 8.8* 8.3*   LFT Recent Labs    04/25/22 1223  PROT 7.3  ALBUMIN 4.3  AST 22  ALT 24  ALKPHOS 44  BILITOT  1.9*    STUDIES: DG Abd Portable 1V  Result Date: 04/25/2022 CLINICAL DATA:  NG tube placement. EXAM: PORTABLE ABDOMEN - 1 VIEW COMPARISON:  None Available. FINDINGS: An enteric tube is noted with tip overlying the distal stomach. A large amount of stool within the visualized proximal colon noted. IMPRESSION: Enteric tube with tip overlying the distal stomach. Electronically Signed   By: Margarette Canada M.D.   On: 04/25/2022 15:08   CT ABDOMEN PELVIS W CONTRAST  Result Date: 04/25/2022 CLINICAL DATA:  Abdominal pain. * Tracking Code: BO * EXAM: CT ABDOMEN AND PELVIS WITH CONTRAST TECHNIQUE: Multidetector CT imaging of the abdomen and pelvis was performed using the standard protocol following bolus administration of intravenous contrast. RADIATION DOSE REDUCTION: This exam was performed according to the departmental dose-optimization program which includes automated exposure control, adjustment of the mA and/or kV according to patient size and/or use of iterative reconstruction technique. CONTRAST:  184m OMNIPAQUE IOHEXOL 300 MG/ML  SOLN COMPARISON:  No prior abdominal comparison is are available. FINDINGS: Lower chest: Calcified coronary artery disease of LEFT and RIGHT coronary circulation. No sign of effusion. No sign of consolidative change. Hepatobiliary: No focal, suspicious hepatic lesion. No pericholecystic stranding. No biliary duct dilation. Portal vein is patent. Hepatic steatosis.  Pancreas: Normal, without mass, inflammation or ductal dilatation. Spleen: Normal. Adrenals/Urinary Tract: Adrenal glands are normal. Symmetric renal enhancement without hydronephrosis or perinephric stranding. No perivesical stranding. No suspicious renal lesion. Bosniak category II cyst arises from the interpolar RIGHT kidney measuring approximately 1.6 cm for which no additional dedicated imaging follow-up is recommended. Stomach/Bowel: No sign of acute process related to the stomach or proximal small bowel. No sign of small bowel obstruction. Irregular enhancement of thickening of the hepatic flexure with proximal colonic dilation pericolonic stranding. Ascending colon is filled with feces. Normal appendix. Eccentric wall thickening at the site of transition up to 2 cm is suggested. Vascular/Lymphatic: Aortic atherosclerosis. No sign of aneurysm. Smooth contour of the IVC. There is no gastrohepatic or hepatoduodenal ligament lymphadenopathy. No retroperitoneal or mesenteric lymphadenopathy. No pelvic sidewall lymphadenopathy. Reproductive: Heterogeneous prostate, nonspecific. Unremarkable CT appearance of reproductive structures otherwise to the extent evaluated. Other: Small fat containing LEFT inguinal hernia. Post RIGHT and LEFT inguinal herniorrhaphy. This may represent residual fat in the LEFT inguinal canal and should be correlated with physical exam. There is trace fluid about the RIGHT colon Musculoskeletal: No acute bone finding. No destructive bone process. Spinal degenerative changes. Degenerative changes about the symphysis pubis and bilateral hips. IMPRESSION: 1. Colonic obstruction at the level of the hepatic flexure suspicious for colonic neoplasm. GI and surgical consultation is advised. Stricture is another differential consideration though the irregular appearance of the colon is highly concerning for neoplasm. 2. Signs of colitis associated with obstruction with marked distension of the cecum,  filled with stool. Ascending colon dilated to 8 cm and associated with surrounding stranding and trace fluid. Findings concerning for stercoral colitis associated with colonic obstruction. Colon may be at risk for perforation. 3. Hepatic steatosis. 4. Post RIGHT and LEFT inguinal herniorrhaphy. This may represent residual fat in the LEFT inguinal canal and should be correlated with physical exam. 5. Small fat containing LEFT inguinal hernia. 6. Calcified coronary artery disease of LEFT and RIGHT coronary circulation. 7. Aortic atherosclerosis. Aortic Atherosclerosis (ICD10-I70.0). Electronically Signed   By: GZetta BillsM.D.   On: 04/25/2022 13:50      Impression / Plan:   Impression: 1.  Colonic obstruction: With CT as above,  colonic obstruction at the level hepatic flexure suspicious for colonic neoplasm also signs of colitis associated with the obstruction, patient with abdominal pain and nausea, no flatus in the past 24 hours 2.  Hyponatremia: At time of admission, improved today 3.  OSA on CPAP 4.  Diabetes  Plan: 1.  At time I reviewed with the patient today general surgery was also in the room.  Discussed that this is a very high grade obstruction far up in the colon.  There is no way that he could drink any bowel prep and an enema would likely not reach this area.  Stenting would not be appropriate.  Surgical team is in agreement and will take him for surgery. 2.  Diet and further plans per the surgical team  Will go ahead and sign off.  Please call us back if we can be of any assistance.  Thank you for your kind consultation.  Lavone Nian [facility]  04/26/2022, 9:16 AM    Attending Physician Note   I have taken a history, reviewed the chart and examined the patient. I performed more than 50% of this encounter in conjunction with the APP. I agree with the APP's note, impression and recommendations with my edits. My additional impressions and recommendations are as follows.   High  grade colonic obstruction at the hepatic flexure concerning for malignancy with ascending colon dilation. He is not a candidate for a bowel prep d/t high grade obstruction with proximal colon dilation so there is no role for colonoscopy, stent at this time. Agree with plans for surgical mgmt. Colonoscopy as an outpatient in a few months is recommended. GI signing off. Available if needed.   Lucio Edward, MD [facility] See [facility], [facility], for our on call provider

## 2022-04-26 NOTE — Anesthesia Preprocedure Evaluation (Signed)
Anesthesia Evaluation  Patient identified by MRN, date of birth, ID band Patient awake    Reviewed: Allergy & Precautions, NPO status , Patient's Chart, lab work & pertinent test results  Airway Mallampati: II  TM Distance: >3 FB Neck ROM: Full    Dental  (+) Teeth Intact, Dental Advisory Given   Pulmonary sleep apnea and Continuous Positive Airway Pressure Ventilation , former smoker   Pulmonary exam normal breath sounds clear to auscultation       Cardiovascular  Rhythm:Regular Rate:Normal     Neuro/Psych    GI/Hepatic Colon mass at hepatic flexure   Endo/Other  diabetes, Type 2    Renal/GU Lab Results      Component                Value               Date                      CREATININE               1.09                04/26/2022                BUN                      16                  04/26/2022                NA                       135                 04/26/2022                K                        3.7                 04/26/2022                   Musculoskeletal  (+) Arthritis ,    Abdominal  (+) + obese  Peds  Hematology Lab Results      Component                Value               Date                      WBC                      9.4                 04/26/2022                HGB                      13.8                04/26/2022                HCT  41.0                04/26/2022                      PLT                      143 (L)             04/26/2022              Anesthesia Other Findings   Reproductive/Obstetrics                             Anesthesia Physical Anesthesia Plan  ASA: 3  Anesthesia Plan: General   Post-op Pain Management: Ketamine IV*, Tylenol PO (pre-op)*, Toradol IV (intra-op)* and Lidocaine infusion*   Induction: Intravenous  PONV Risk Score and Plan: 3 and Treatment may vary due to age or medical condition, Midazolam  and Ondansetron  Airway Management Planned: Oral ETT  Additional Equipment: None  Intra-op Plan:   Post-operative Plan: Extubation in OR  Informed Consent:      Dental advisory given  Plan Discussed with:   Anesthesia Plan Comments:        Anesthesia Quick Evaluation

## 2022-04-26 NOTE — Progress Notes (Signed)
PROGRESS NOTE    Noah Lane  [id number] DOB: 27-Aug-1955 DOA: 04/25/2022 PCP: [name], DO   Brief Narrative: Noah Lane is a 67 y.o. male with medical history significant of T2DM, HLD, OSA on CPAP, and obesity who presents as a transfer from outside ED for colonic obstruction requiring GI and general surgery consultation.    Abdominal pain started midnight to right lower quadrant. Tried to vomit and have bowel movement without relieve and decided to go to ED. Has hx of umbilical and inguinal hernia years ago. No weight loss but has actually gained weight due to diet. Had colonoscopy probably 10 years ago in [place] and reports benign polyps that were removed.  No tobacco use. No family hx of colon cancer.   In the ED, he was afebrile and normotensive BP elevated up to 185/85.   Mild leukocytosis of 11.5, hemoglobin of 15.   Mild hyponatremia of 133, K of 4, creatinine 1.05, CBG of 182.   Colonic obstruction at the level of the hepatic flexure suspicious for colonic neoplasm.  There is also signs of colitis associated with obstruction with marked distention of the cecum.  Ascending colon dilated to a centimeter and associated with surrounding stranding and trace fluid.  Findings concerning for stercoral colitis associated with colonic obstruction.  Colon may be at risk for perforation.   EDP consulted general surgery Dr. Marlou Starks and he will see in consultation. Likely will need GI consultation as well. NG tube was placed.   Assessment & Plan:   Principal Problem:   Colonic obstruction (HCC) Active Problems:   Diabetes mellitus type 2 in obese (HCC)   OSA on CPAP   Obesity (BMI 30.0-34.9)   Hyponatremia    #1 colonic obstruction at the level of the hepatic flexure suspicious for malignancy.  Patient admitted with nausea vomiting and abdominal distention.  CT abdomen shows Signs of colitis associated with obstruction with marked distension of the cecum, filled  with stool. Ascending colon dilated to 8 cm and associated with surrounding stranding and trace fluid. Findings concerning for stercoral colitis associated with colonic obstruction. Colon may be at risk for perforation. Plan for OR in am  Continue NGT Seen by GI and gen surgery  #2 dm A1c 8.8 on metformin at home  Ssi  CBG (last 3)  No results for input(s): "GLUCAP" in the last 72 hours.  #3 hyperlipidemia on Lipitor at home  #4 obstructive sleep apnea uses CPAP at home   Estimated body mass index is 34.04 kg/m as calculated from the following:   Height as of this encounter: '6\' 1"'$  (1.854 m).   Weight as of this encounter: 117 kg.  DVT prophylaxis: Lovenox Code Status: Full code  family Communication: None at bedside  disposition Plan:  Status is: Inpatient Remains inpatient appropriate because: Colonic obstruction with NG tube in place   Consultants:  GI and general surgery  Procedures: NG tube 04/25/2022 Antimicrobials: None  Subjective: Resting in bed NG tube in place Talkative And tearful Asking for sips of ice  Objective: Vitals:   04/26/22 0129 04/26/22 0416 04/26/22 0942 04/26/22 1325  BP: (!) 153/79 (!) 147/73 (!) 159/78 (!) 161/87  Pulse: 73 73 64 75  Resp: '16 16 18 18  '$ Temp: 98.4 F (36.9 C) 97.8 F (36.6 C) 98.8 F (37.1 C) 98.8 F (37.1 C)  TempSrc: Oral Oral Oral Oral  SpO2: 98% 93% 95% 96%  Weight:      Height:  Intake/Output Summary (Last 24 hours) at 04/26/2022 1354 Last data filed at 04/26/2022 1000 Gross per 24 hour  Intake 441.04 ml  Output 2790 ml  Net -2348.96 ml   Filed Weights   04/25/22 1145  Weight: 117 kg    Examination:  General exam: Appears calm and comfortable  Respiratory system: Clear to auscultation. Respiratory effort normal. Cardiovascular system: S1 & S2 heard, RRR. No JVD, murmurs, rubs, gallops or clicks. No pedal edema. Gastrointestinal system: Abdomen is distended, soft and nontender. No organomegaly  or masses felt. Normal bowel sounds heard. Central nervous system: Alert and oriented. No focal neurological deficits. Extremities: Symmetric 5 x 5 power. Skin: No rashes, lesions or ulcers Psychiatry: Judgement and insight appear normal. Mood & affect appropriate.     Data Reviewed: I have personally reviewed following labs and imaging studies  CBC: Recent Labs  Lab 04/22/22 0808 04/25/22 1223 04/26/22 0404  WBC 7.5 11.5* 9.4  NEUTROABS  --  9.4*  --   HGB 14.7 15.0 13.8  HCT 42.9 44.0 41.0  MCV 85.0 83.0 85.6  PLT 156.0 178 [id number]*   Basic Metabolic Panel: Recent Labs  Lab 04/22/22 0808 04/25/22 1223 04/26/22 0404  NA 133* 133* 135  K 4.2 4.0 3.7  CL 97 101 101  CO2 '28 23 27  '$ GLUCOSE 200* 182* 169*  BUN '22 17 16  '$ CREATININE 1.10 1.05 1.09  CALCIUM 9.3 8.8* 8.3*   GFR: Estimated Creatinine Clearance: 86.9 mL/min (by C-G formula based on SCr of 1.09 mg/dL). Liver Function Tests: Recent Labs  Lab 04/22/22 0808 04/25/22 1223  AST 16 22  ALT 21 24  ALKPHOS 41 44  BILITOT 1.5* 1.9*  PROT 6.7 7.3  ALBUMIN 4.6 4.3   Recent Labs  Lab 04/25/22 1223  LIPASE 30   No results for input(s): "AMMONIA" in the last 168 hours. Coagulation Profile: No results for input(s): "INR", "PROTIME" in the last 168 hours. Cardiac Enzymes: No results for input(s): "CKTOTAL", "CKMB", "CKMBINDEX", "TROPONINI" in the last 168 hours. BNP (last 3 results) No results for input(s): "PROBNP" in the last 8760 hours. HbA1C: No results for input(s): "HGBA1C" in the last 72 hours. CBG: No results for input(s): "GLUCAP" in the last 168 hours. Lipid Profile: No results for input(s): "CHOL", "HDL", "LDLCALC", "TRIG", "CHOLHDL", "LDLDIRECT" in the last 72 hours. Thyroid Function Tests: No results for input(s): "TSH", "T4TOTAL", "FREET4", "T3FREE", "THYROIDAB" in the last 72 hours. Anemia Panel: No results for input(s): "VITAMINB12", "FOLATE", "FERRITIN", "TIBC", "IRON", "RETICCTPCT" in the  last 72 hours. Sepsis Labs: No results for input(s): "PROCALCITON", "LATICACIDVEN" in the last 168 hours.  No results found for this or any previous visit (from the past 240 hour(s)).       Radiology Studies: DG Abd Portable 1V  Result Date: 04/25/2022 CLINICAL DATA:  NG tube placement. EXAM: PORTABLE ABDOMEN - 1 VIEW COMPARISON:  None Available. FINDINGS: An enteric tube is noted with tip overlying the distal stomach. A large amount of stool within the visualized proximal colon noted. IMPRESSION: Enteric tube with tip overlying the distal stomach. Electronically Signed   By: Margarette Canada M.D.   On: 04/25/2022 15:08   CT ABDOMEN PELVIS W CONTRAST  Result Date: 04/25/2022 CLINICAL DATA:  Abdominal pain. * Tracking Code: BO * EXAM: CT ABDOMEN AND PELVIS WITH CONTRAST TECHNIQUE: Multidetector CT imaging of the abdomen and pelvis was performed using the standard protocol following bolus administration of intravenous contrast. RADIATION DOSE REDUCTION: This exam was performed according to  the departmental dose-optimization program which includes automated exposure control, adjustment of the mA and/or kV according to patient size and/or use of iterative reconstruction technique. CONTRAST:  125m OMNIPAQUE IOHEXOL 300 MG/ML  SOLN COMPARISON:  No prior abdominal comparison is are available. FINDINGS: Lower chest: Calcified coronary artery disease of LEFT and RIGHT coronary circulation. No sign of effusion. No sign of consolidative change. Hepatobiliary: No focal, suspicious hepatic lesion. No pericholecystic stranding. No biliary duct dilation. Portal vein is patent. Hepatic steatosis. Pancreas: Normal, without mass, inflammation or ductal dilatation. Spleen: Normal. Adrenals/Urinary Tract: Adrenal glands are normal. Symmetric renal enhancement without hydronephrosis or perinephric stranding. No perivesical stranding. No suspicious renal lesion. Bosniak category II cyst arises from the interpolar RIGHT  kidney measuring approximately 1.6 cm for which no additional dedicated imaging follow-up is recommended. Stomach/Bowel: No sign of acute process related to the stomach or proximal small bowel. No sign of small bowel obstruction. Irregular enhancement of thickening of the hepatic flexure with proximal colonic dilation pericolonic stranding. Ascending colon is filled with feces. Normal appendix. Eccentric wall thickening at the site of transition up to 2 cm is suggested. Vascular/Lymphatic: Aortic atherosclerosis. No sign of aneurysm. Smooth contour of the IVC. There is no gastrohepatic or hepatoduodenal ligament lymphadenopathy. No retroperitoneal or mesenteric lymphadenopathy. No pelvic sidewall lymphadenopathy. Reproductive: Heterogeneous prostate, nonspecific. Unremarkable CT appearance of reproductive structures otherwise to the extent evaluated. Other: Small fat containing LEFT inguinal hernia. Post RIGHT and LEFT inguinal herniorrhaphy. This may represent residual fat in the LEFT inguinal canal and should be correlated with physical exam. There is trace fluid about the RIGHT colon Musculoskeletal: No acute bone finding. No destructive bone process. Spinal degenerative changes. Degenerative changes about the symphysis pubis and bilateral hips. IMPRESSION: 1. Colonic obstruction at the level of the hepatic flexure suspicious for colonic neoplasm. GI and surgical consultation is advised. Stricture is another differential consideration though the irregular appearance of the colon is highly concerning for neoplasm. 2. Signs of colitis associated with obstruction with marked distension of the cecum, filled with stool. Ascending colon dilated to 8 cm and associated with surrounding stranding and trace fluid. Findings concerning for stercoral colitis associated with colonic obstruction. Colon may be at risk for perforation. 3. Hepatic steatosis. 4. Post RIGHT and LEFT inguinal herniorrhaphy. This may represent  residual fat in the LEFT inguinal canal and should be correlated with physical exam. 5. Small fat containing LEFT inguinal hernia. 6. Calcified coronary artery disease of LEFT and RIGHT coronary circulation. 7. Aortic atherosclerosis. Aortic Atherosclerosis (ICD10-I70.0). Electronically Signed   By: GZetta BillsM.D.   On: 04/25/2022 13:50        Scheduled Meds: Continuous Infusions:  sodium chloride      LOS: 1 day   Time spent: 39 min  EGeorgette Shell MD 04/26/2022, 1:54 PM

## 2022-04-26 NOTE — Telephone Encounter (Signed)
Pt was admitted to hospital. 

## 2022-04-26 NOTE — Progress Notes (Addendum)
Patient is refusing CBG's because he has the [place] on his left arm that he gets his CBG from. I have manually put in the number through his swipe from his phone. He has also refused his insulin.   Dr. Rodena Piety was notified and is aware.

## 2022-04-26 NOTE — Consult Note (Signed)
[facility] Nurse requested for preoperative stoma site marking  Discussed surgical procedure and stoma creation with patient and family.  Explained role of the [facility] nurse team.  Provided the patient with educational booklet and provided samples of pouching options. Answered patient and family questions.   Examined patient lying and sitting upright, in order to place the marking in the patient's visual field, away from any creases or abdominal contour issues and within the rectus muscle. Attempted to mark below the patient's belt line, but this was not possible, since a significant crease appears lower on the abd when the patient sits upright and should be avoided if possible.   Marked for colostomy in the LLQ  __5__ cm to the left of the umbilicus and [id number] above the umbilicus.  Marked for ileostomy in the RLQ  __5__cm to the right of the umbilicus and  [id number] cm above the umbilicus.  Patient's abdomen cleansed with CHG wipes at site markings, allowed to air dry prior to marking.Plans for surgery tomorrow. [facility] Nurse team will follow up with patient after surgery for continued ostomy care and teaching if he receives an ostomy.  Thank-you,  Julien Girt MSN, [place], [place], [place], [place]

## 2022-04-26 NOTE — Progress Notes (Addendum)
[facility] Progress Note     Subjective: CC:  Reports mild right sided abd pain. States he didn't have any GI symptoms until Saturday (2 days ago) at midnight when he developed constant abdominal pain. Associated with nausea. Denies constipation or weight loss. He expresses anxiety and admits to trying to cope with It by making jokes and staying positive.   Objective: Vital signs in last 24 hours: Temp:  [97.8 F (36.6 C)-99.1 F (37.3 C)] 98.8 F (37.1 C) (02/26 0942) Pulse Rate:  [64-77] 64 (02/26 0942) Resp:  [16-18] 18 (02/26 0942) BP: (134-185)/(69-97) 159/78 (02/26 0942) SpO2:  [93 %-100 %] 95 % (02/26 0942) FiO2 (%):  [21 %] 21 % (02/25 2155) Weight:  WG:[id number] kg] 117 kg (02/25 1145) Last BM Date : 04/24/22  Intake/Output from previous day: 02/25 0701 - 02/26 0700 In: 1445.9 [I.V.:441; IV Piggyback:1004.8] Out: 2290 [Urine:340; Emesis/NG output:1550] Intake/Output this shift: Total I/O In: -  Out: 500 [Urine:300; Emesis/NG output:200]  PE: Gen:  Alert, NAD, pleasant Card:  Regular rate and rhythm Pulm:  Normal effort ORA Abd: Soft, distended, RUQ fullness and mild tenderness without rebound tenderness or guarding.  Skin: warm and dry, no rashes  Psych: A&Ox3   Lab Results:  Recent Labs    04/25/22 1223 04/26/22 0404  WBC 11.5* 9.4  HGB 15.0 13.8  HCT 44.0 41.0  PLT 178 143*   BMET Recent Labs    04/25/22 1223 04/26/22 0404  NA 133* 135  K 4.0 3.7  CL 101 101  CO2 23 27  GLUCOSE 182* 169*  BUN 17 16  CREATININE 1.05 1.09  CALCIUM 8.8* 8.3*   PT/INR No results for input(s): "LABPROT", "INR" in the last 72 hours. CMP     Component Value Date/Time   NA 135 04/26/2022 0404   K 3.7 04/26/2022 0404   CL 101 04/26/2022 0404   CO2 27 04/26/2022 0404   GLUCOSE 169 (H) 04/26/2022 0404   BUN 16 04/26/2022 0404   CREATININE 1.09 04/26/2022 0404   CREATININE 1.11 05/23/2020 1628   CALCIUM 8.3 (L) 04/26/2022 0404   PROT 7.3 04/25/2022  1223   ALBUMIN 4.3 04/25/2022 1223   AST 22 04/25/2022 1223   ALT 24 04/25/2022 1223   ALKPHOS 44 04/25/2022 1223   BILITOT 1.9 (H) 04/25/2022 1223   GFRNONAA >60 04/26/2022 0404   Lipase     Component Value Date/Time   LIPASE 30 04/25/2022 1223     Studies/Results: DG Abd Portable 1V  Result Date: 04/25/2022 CLINICAL DATA:  NG tube placement. EXAM: PORTABLE ABDOMEN - 1 VIEW COMPARISON:  None Available. FINDINGS: An enteric tube is noted with tip overlying the distal stomach. A large amount of stool within the visualized proximal colon noted. IMPRESSION: Enteric tube with tip overlying the distal stomach. Electronically Signed   By: Margarette Canada M.D.   On: 04/25/2022 15:08   CT ABDOMEN PELVIS W CONTRAST  Result Date: 04/25/2022 CLINICAL DATA:  Abdominal pain. * Tracking Code: BO * EXAM: CT ABDOMEN AND PELVIS WITH CONTRAST TECHNIQUE: Multidetector CT imaging of the abdomen and pelvis was performed using the standard protocol following bolus administration of intravenous contrast. RADIATION DOSE REDUCTION: This exam was performed according to the departmental dose-optimization program which includes automated exposure control, adjustment of the mA and/or kV according to patient size and/or use of iterative reconstruction technique. CONTRAST:  128m OMNIPAQUE IOHEXOL 300 MG/ML  SOLN COMPARISON:  No prior abdominal comparison is are available. FINDINGS: Lower  chest: Calcified coronary artery disease of LEFT and RIGHT coronary circulation. No sign of effusion. No sign of consolidative change. Hepatobiliary: No focal, suspicious hepatic lesion. No pericholecystic stranding. No biliary duct dilation. Portal vein is patent. Hepatic steatosis. Pancreas: Normal, without mass, inflammation or ductal dilatation. Spleen: Normal. Adrenals/Urinary Tract: Adrenal glands are normal. Symmetric renal enhancement without hydronephrosis or perinephric stranding. No perivesical stranding. No suspicious renal lesion.  Bosniak category II cyst arises from the interpolar RIGHT kidney measuring approximately 1.6 cm for which no additional dedicated imaging follow-up is recommended. Stomach/Bowel: No sign of acute process related to the stomach or proximal small bowel. No sign of small bowel obstruction. Irregular enhancement of thickening of the hepatic flexure with proximal colonic dilation pericolonic stranding. Ascending colon is filled with feces. Normal appendix. Eccentric wall thickening at the site of transition up to 2 cm is suggested. Vascular/Lymphatic: Aortic atherosclerosis. No sign of aneurysm. Smooth contour of the IVC. There is no gastrohepatic or hepatoduodenal ligament lymphadenopathy. No retroperitoneal or mesenteric lymphadenopathy. No pelvic sidewall lymphadenopathy. Reproductive: Heterogeneous prostate, nonspecific. Unremarkable CT appearance of reproductive structures otherwise to the extent evaluated. Other: Small fat containing LEFT inguinal hernia. Post RIGHT and LEFT inguinal herniorrhaphy. This may represent residual fat in the LEFT inguinal canal and should be correlated with physical exam. There is trace fluid about the RIGHT colon Musculoskeletal: No acute bone finding. No destructive bone process. Spinal degenerative changes. Degenerative changes about the symphysis pubis and bilateral hips. IMPRESSION: 1. Colonic obstruction at the level of the hepatic flexure suspicious for colonic neoplasm. GI and surgical consultation is advised. Stricture is another differential consideration though the irregular appearance of the colon is highly concerning for neoplasm. 2. Signs of colitis associated with obstruction with marked distension of the cecum, filled with stool. Ascending colon dilated to 8 cm and associated with surrounding stranding and trace fluid. Findings concerning for stercoral colitis associated with colonic obstruction. Colon may be at risk for perforation. 3. Hepatic steatosis. 4. Post RIGHT  and LEFT inguinal herniorrhaphy. This may represent residual fat in the LEFT inguinal canal and should be correlated with physical exam. 5. Small fat containing LEFT inguinal hernia. 6. Calcified coronary artery disease of LEFT and RIGHT coronary circulation. 7. Aortic atherosclerosis. Aortic Atherosclerosis (ICD10-I70.0). Electronically Signed   By: Zetta Bills M.D.   On: 04/25/2022 13:50    Anti-infectives: Anti-infectives (From admission, onward)    None      Assessment/Plan Hepatic flexure mass cause LBO  - evaluated by GI today and, given high grade obstruction. They feel he is not a candidate for bowel prep, stent, or colonoscopy.  - continue NG tube to LIWS - recommend partial colectomy, possible anastomosis, possible ostomy this admission. Will discuss surgical timing with my attending, Dr. Michaelle Birks.   FEN: NPO, IVF, NG to LIWS, ok for ice chips ID: none  currently, no signs of perforation or abscess VTE: SCD's, recommend starting DVT prophylaxis with lovenox Foley: none Dispo: med surg, surgical planning   OSA on CPAP DM2, uncontrolled, A1c 8.8%     LOS: 1 day   I reviewed nursing notes, Consultant GI notes, hospitalist notes, last 24 h vitals and pain scores, last 48 h intake and output, last 24 h labs and trends, and last 24 h imaging results.  This care required moderate level of medical decision making.   Obie Dredge, PA-C [facility] Please see Amion for pager number during day hours 7:00am-4:30pm

## 2022-04-27 ENCOUNTER — Other Ambulatory Visit: Payer: Self-pay

## 2022-04-27 ENCOUNTER — Encounter (HOSPITAL_COMMUNITY)
Admission: EM | Disposition: A | Discharge status: Home / Self Care | Payer: Self-pay | Source: Home / Self Care | Attending: Internal Medicine

## 2022-04-27 ENCOUNTER — Inpatient Hospital Stay (HOSPITAL_COMMUNITY): Payer: Medicare Other | Admitting: Anesthesiology

## 2022-04-27 ENCOUNTER — Encounter (HOSPITAL_COMMUNITY): Payer: Self-pay | Admitting: Internal Medicine

## 2022-04-27 ENCOUNTER — Encounter: Payer: Medicare Other | Admitting: Family Medicine

## 2022-04-27 DIAGNOSIS — G4733 Obstructive sleep apnea (adult) (pediatric): Secondary | ICD-10-CM | POA: Diagnosis not present

## 2022-04-27 DIAGNOSIS — K56609 Unspecified intestinal obstruction, unspecified as to partial versus complete obstruction: Secondary | ICD-10-CM | POA: Diagnosis not present

## 2022-04-27 DIAGNOSIS — Z7984 Long term (current) use of oral hypoglycemic drugs: Secondary | ICD-10-CM

## 2022-04-27 DIAGNOSIS — Z9989 Dependence on other enabling machines and devices: Secondary | ICD-10-CM

## 2022-04-27 DIAGNOSIS — E119 Type 2 diabetes mellitus without complications: Secondary | ICD-10-CM

## 2022-04-27 HISTORY — PX: COLON RESECTION: SHX5231

## 2022-04-27 LAB — GLUCOSE, CAPILLARY
Glucose-Capillary: 171 mg/dL — ABNORMAL HIGH (ref 70–99)
Glucose-Capillary: 200 mg/dL — ABNORMAL HIGH (ref 70–99)
Glucose-Capillary: 222 mg/dL — ABNORMAL HIGH (ref 70–99)

## 2022-04-27 LAB — ABO/RH: ABO/RH(D): AB NEG

## 2022-04-27 LAB — TYPE AND SCREEN
ABO/RH(D): AB NEG
Antibody Screen: NEGATIVE

## 2022-04-27 LAB — SURGICAL PCR SCREEN
MRSA, PCR: NEGATIVE
Staphylococcus aureus: POSITIVE — AB

## 2022-04-27 SURGERY — LAPAROSCOPIC RIGHT COLON RESECTION
Anesthesia: General

## 2022-04-27 MED ORDER — SUCCINYLCHOLINE CHLORIDE 200 MG/10ML IV SOSY
PREFILLED_SYRINGE | INTRAVENOUS | Status: DC | PRN
  Administered 2022-04-27: 120 mg via INTRAVENOUS

## 2022-04-27 MED ORDER — PROPOFOL 10 MG/ML IV BOLUS
INTRAVENOUS | Status: DC | PRN
  Administered 2022-04-27: 150 mg via INTRAVENOUS

## 2022-04-27 MED ORDER — LIDOCAINE 5 % EX PTCH
1.0000 | MEDICATED_PATCH | CUTANEOUS | Status: DC
  Administered 2022-04-27 – 2022-05-03 (×7): 1 via TRANSDERMAL
  Filled 2022-04-27 (×8): qty 1

## 2022-04-27 MED ORDER — SODIUM CHLORIDE (PF) 0.9 % IJ SOLN
INTRAMUSCULAR | Status: AC
  Filled 2022-04-27: qty 20

## 2022-04-27 MED ORDER — SODIUM CHLORIDE 0.9 % IV SOLN
2.0000 g | INTRAVENOUS | Status: AC
  Administered 2022-04-27: 2 g via INTRAVENOUS
  Filled 2022-04-27: qty 2

## 2022-04-27 MED ORDER — SODIUM CHLORIDE 0.9 % IR SOLN
Status: DC | PRN
  Administered 2022-04-27: 2000 mL

## 2022-04-27 MED ORDER — LIDOCAINE 2% (20 MG/ML) 5 ML SYRINGE
INTRAMUSCULAR | Status: DC | PRN
  Administered 2022-04-27: 100 mg via INTRAVENOUS

## 2022-04-27 MED ORDER — PHENYLEPHRINE 80 MCG/ML (10ML) SYRINGE FOR IV PUSH (FOR BLOOD PRESSURE SUPPORT)
PREFILLED_SYRINGE | INTRAVENOUS | Status: AC
  Filled 2022-04-27: qty 10

## 2022-04-27 MED ORDER — SODIUM CHLORIDE (PF) 0.9 % IJ SOLN
INTRAMUSCULAR | Status: DC | PRN
  Administered 2022-04-27: 40 mL

## 2022-04-27 MED ORDER — PROPOFOL 10 MG/ML IV BOLUS
INTRAVENOUS | Status: AC
  Filled 2022-04-27: qty 20

## 2022-04-27 MED ORDER — DEXAMETHASONE SODIUM PHOSPHATE 10 MG/ML IJ SOLN
INTRAMUSCULAR | Status: AC
  Filled 2022-04-27: qty 1

## 2022-04-27 MED ORDER — FENTANYL CITRATE (PF) 100 MCG/2ML IJ SOLN
INTRAMUSCULAR | Status: DC | PRN
  Administered 2022-04-27: 50 ug via INTRAVENOUS
  Administered 2022-04-27: 150 ug via INTRAVENOUS
  Administered 2022-04-27 (×3): 50 ug via INTRAVENOUS

## 2022-04-27 MED ORDER — ONDANSETRON HCL 4 MG/2ML IJ SOLN
INTRAMUSCULAR | Status: DC | PRN
  Administered 2022-04-27: 4 mg via INTRAVENOUS

## 2022-04-27 MED ORDER — LACTATED RINGERS IV SOLN: INTRAVENOUS | Status: DC | PRN

## 2022-04-27 MED ORDER — FENTANYL CITRATE (PF) 100 MCG/2ML IJ SOLN
INTRAMUSCULAR | Status: AC
  Filled 2022-04-27: qty 2

## 2022-04-27 MED ORDER — MIDAZOLAM HCL 2 MG/2ML IJ SOLN
INTRAMUSCULAR | Status: AC
  Filled 2022-04-27: qty 2

## 2022-04-27 MED ORDER — BUPIVACAINE HCL (PF) 0.25 % IJ SOLN
INTRAMUSCULAR | Status: AC
  Filled 2022-04-27: qty 30

## 2022-04-27 MED ORDER — KETAMINE HCL 50 MG/5ML IJ SOSY
PREFILLED_SYRINGE | INTRAMUSCULAR | Status: AC
  Filled 2022-04-27: qty 5

## 2022-04-27 MED ORDER — ACETAMINOPHEN 500 MG PO TABS
1000.0000 mg | ORAL_TABLET | Freq: Three times a day (TID) | ORAL | Status: DC
  Administered 2022-04-27 – 2022-05-04 (×21): 1000 mg via ORAL
  Filled 2022-04-27 (×20): qty 2

## 2022-04-27 MED ORDER — MUPIROCIN 2 % EX OINT
TOPICAL_OINTMENT | CUTANEOUS | Status: AC
  Filled 2022-04-27: qty 22

## 2022-04-27 MED ORDER — LACTATED RINGERS IR SOLN
Status: DC | PRN
  Administered 2022-04-27: 1000 mL

## 2022-04-27 MED ORDER — FENTANYL CITRATE (PF) 250 MCG/5ML IJ SOLN
INTRAMUSCULAR | Status: AC
  Filled 2022-04-27: qty 5

## 2022-04-27 MED ORDER — ROCURONIUM BROMIDE 10 MG/ML (PF) SYRINGE
PREFILLED_SYRINGE | INTRAVENOUS | Status: AC
  Filled 2022-04-27: qty 10

## 2022-04-27 MED ORDER — ONDANSETRON HCL 4 MG/2ML IJ SOLN
INTRAMUSCULAR | Status: AC
  Filled 2022-04-27: qty 2

## 2022-04-27 MED ORDER — LIDOCAINE HCL (PF) 2 % IJ SOLN
INTRAMUSCULAR | Status: DC | PRN
  Administered 2022-04-27: 1.5 mg/kg/h

## 2022-04-27 MED ORDER — HYDROMORPHONE HCL 2 MG/ML IJ SOLN
INTRAMUSCULAR | Status: AC
  Filled 2022-04-27: qty 1

## 2022-04-27 MED ORDER — ROCURONIUM BROMIDE 10 MG/ML (PF) SYRINGE
PREFILLED_SYRINGE | INTRAVENOUS | Status: DC | PRN
  Administered 2022-04-27: 70 mg via INTRAVENOUS
  Administered 2022-04-27: 30 mg via INTRAVENOUS

## 2022-04-27 MED ORDER — HYDRALAZINE HCL 20 MG/ML IJ SOLN: 5.0000 mg | Freq: Three times a day (TID) | INTRAMUSCULAR | Status: DC | PRN

## 2022-04-27 MED ORDER — CHLORHEXIDINE GLUCONATE CLOTH 2 % EX PADS
6.0000 | MEDICATED_PAD | Freq: Every day | CUTANEOUS | Status: AC
  Administered 2022-04-28 – 2022-05-01 (×4): 6 via TOPICAL

## 2022-04-27 MED ORDER — SUCCINYLCHOLINE CHLORIDE 200 MG/10ML IV SOSY
PREFILLED_SYRINGE | INTRAVENOUS | Status: AC
  Filled 2022-04-27: qty 10

## 2022-04-27 MED ORDER — METHOCARBAMOL 1000 MG/10ML IJ SOLN
500.0000 mg | Freq: Three times a day (TID) | INTRAVENOUS | Status: DC
  Administered 2022-04-27 – 2022-05-03 (×18): 500 mg via INTRAVENOUS
  Filled 2022-04-27 (×3): qty 500
  Filled 2022-04-27: qty 5
  Filled 2022-04-27 (×4): qty 500
  Filled 2022-04-27: qty 5
  Filled 2022-04-27 (×3): qty 500
  Filled 2022-04-27: qty 5
  Filled 2022-04-27 (×7): qty 500

## 2022-04-27 MED ORDER — PHENYLEPHRINE 80 MCG/ML (10ML) SYRINGE FOR IV PUSH (FOR BLOOD PRESSURE SUPPORT)
PREFILLED_SYRINGE | INTRAVENOUS | Status: DC | PRN
  Administered 2022-04-27: 120 ug via INTRAVENOUS

## 2022-04-27 MED ORDER — SUGAMMADEX SODIUM 200 MG/2ML IV SOLN
INTRAVENOUS | Status: DC | PRN
  Administered 2022-04-27: 200 mg via INTRAVENOUS

## 2022-04-27 MED ORDER — MIDAZOLAM HCL 5 MG/5ML IJ SOLN
INTRAMUSCULAR | Status: DC | PRN
  Administered 2022-04-27: 2 mg via INTRAVENOUS

## 2022-04-27 MED ORDER — ENOXAPARIN SODIUM 40 MG/0.4ML IJ SOSY
40.0000 mg | PREFILLED_SYRINGE | INTRAMUSCULAR | Status: DC
  Administered 2022-04-28 – 2022-05-04 (×7): 40 mg via SUBCUTANEOUS
  Filled 2022-04-27 (×7): qty 0.4

## 2022-04-27 MED ORDER — CHLORHEXIDINE GLUCONATE CLOTH 2 % EX PADS: 6.0000 | MEDICATED_PAD | Freq: Once | CUTANEOUS | Status: DC

## 2022-04-27 MED ORDER — BUPIVACAINE LIPOSOME 1.3 % IJ SUSP
INTRAMUSCULAR | Status: AC
  Filled 2022-04-27: qty 20

## 2022-04-27 MED ORDER — DEXAMETHASONE SODIUM PHOSPHATE 4 MG/ML IJ SOLN
INTRAMUSCULAR | Status: DC | PRN
  Administered 2022-04-27: 5 mg via INTRAVENOUS

## 2022-04-27 MED ORDER — LIDOCAINE HCL 2 % IJ SOLN
INTRAMUSCULAR | Status: AC
  Filled 2022-04-27: qty 20

## 2022-04-27 MED ORDER — MORPHINE SULFATE (PF) 2 MG/ML IV SOLN
2.0000 mg | INTRAVENOUS | Status: DC | PRN
  Administered 2022-04-27 (×3): 2 mg via INTRAVENOUS
  Administered 2022-04-28 (×3): 4 mg via INTRAVENOUS
  Administered 2022-04-29 – 2022-04-30 (×4): 2 mg via INTRAVENOUS
  Administered 2022-05-01: 4 mg via INTRAVENOUS
  Administered 2022-05-01 (×2): 2 mg via INTRAVENOUS
  Filled 2022-04-27 (×2): qty 1
  Filled 2022-04-27: qty 2
  Filled 2022-04-27 (×3): qty 1
  Filled 2022-04-27: qty 2
  Filled 2022-04-27 (×3): qty 1
  Filled 2022-04-27 (×2): qty 2
  Filled 2022-04-27: qty 1
  Filled 2022-04-27: qty 2

## 2022-04-27 MED ORDER — KETAMINE HCL 10 MG/ML IJ SOLN
INTRAMUSCULAR | Status: DC | PRN
  Administered 2022-04-27: 20 mg via INTRAVENOUS

## 2022-04-27 MED ORDER — HYDROMORPHONE HCL 1 MG/ML IJ SOLN
INTRAMUSCULAR | Status: DC | PRN
  Administered 2022-04-27 (×4): .5 mg via INTRAVENOUS

## 2022-04-27 MED ORDER — MUPIROCIN 2 % EX OINT
1.0000 | TOPICAL_OINTMENT | Freq: Two times a day (BID) | CUTANEOUS | Status: AC
  Administered 2022-04-27 – 2022-05-01 (×10): 1 via NASAL
  Filled 2022-04-27: qty 22

## 2022-04-27 SURGICAL SUPPLY — 69 items
APPLIER CLIP 5 13 M/L LIGAMAX5 (MISCELLANEOUS)
BAG COUNTER SPONGE SURGICOUNT (BAG) ×1 IMPLANT
BLADE EXTENDED COATED 6.5IN (ELECTRODE) IMPLANT
CABLE HIGH FREQUENCY MONO STRZ (ELECTRODE) IMPLANT
CELLS DAT CNTRL 66122 CELL SVR (MISCELLANEOUS) IMPLANT
CHLORAPREP W/TINT 26 (MISCELLANEOUS) ×1 IMPLANT
CLIP APPLIE 5 13 M/L LIGAMAX5 (MISCELLANEOUS) IMPLANT
DERMABOND ADVANCED .7 DNX12 (GAUZE/BANDAGES/DRESSINGS) ×1 IMPLANT
DRAIN CHANNEL 19F RND (DRAIN) IMPLANT
DRAPE LAPAROSCOPIC ABDOMINAL (DRAPES) ×1 IMPLANT
DRAPE SURG IRRIG POUCH 19X23 (DRAPES) ×1 IMPLANT
DRSG OPSITE POSTOP 4X10 (GAUZE/BANDAGES/DRESSINGS) IMPLANT
DRSG OPSITE POSTOP 4X6 (GAUZE/BANDAGES/DRESSINGS) IMPLANT
DRSG OPSITE POSTOP 4X8 (GAUZE/BANDAGES/DRESSINGS) IMPLANT
ELECT REM PT RETURN 15FT ADLT (MISCELLANEOUS) ×1 IMPLANT
EVACUATOR SILICONE 100CC (DRAIN) IMPLANT
GAUZE SPONGE 4X4 12PLY STRL (GAUZE/BANDAGES/DRESSINGS) IMPLANT
GLOVE BIO SURGEON STRL SZ 6.5 (GLOVE) ×2 IMPLANT
GLOVE BIOGEL PI IND STRL 7.0 (GLOVE) ×2 IMPLANT
GLOVE INDICATOR 6.5 STRL GRN (GLOVE) ×1 IMPLANT
GOWN STRL REUS W/ TWL XL LVL3 (GOWN DISPOSABLE) ×6 IMPLANT
GOWN STRL REUS W/TWL XL LVL3 (GOWN DISPOSABLE) ×6
HOLDER FOLEY CATH W/STRAP (MISCELLANEOUS) ×1 IMPLANT
IRRIG SUCT STRYKERFLOW 2 WTIP (MISCELLANEOUS) ×1
IRRIGATION SUCT STRKRFLW 2 WTP (MISCELLANEOUS) ×1 IMPLANT
KIT TURNOVER KIT A (KITS) IMPLANT
LIGASURE BLUNT TIP 5 LONG 44CM (ELECTROSURGICAL) IMPLANT
LIGASURE IMPACT 36 18CM CVD LR (INSTRUMENTS) IMPLANT
PACK COLON (CUSTOM PROCEDURE TRAY) ×1 IMPLANT
PAD POSITIONING PINK XL (MISCELLANEOUS) ×1 IMPLANT
PENCIL SMOKE EVACUATOR (MISCELLANEOUS) IMPLANT
PROTECTOR NERVE ULNAR (MISCELLANEOUS) ×1 IMPLANT
RELOAD PROXIMATE 75MM BLUE (ENDOMECHANICALS) ×2 IMPLANT
RELOAD STAPLE 75 3.8 BLU REG (ENDOMECHANICALS) IMPLANT
RETRACTOR WND ALEXIS 18 MED (MISCELLANEOUS) IMPLANT
RTRCTR WOUND ALEXIS 18CM MED (MISCELLANEOUS)
SCISSORS LAP 5X35 DISP (ENDOMECHANICALS) ×1 IMPLANT
SEALER TISSUE G2 STRG ARTC 35C (ENDOMECHANICALS) ×1 IMPLANT
SET TUBE SMOKE EVAC HIGH FLOW (TUBING) ×1 IMPLANT
SLEEVE Z-THREAD 5X100MM (TROCAR) ×1 IMPLANT
SPIKE FLUID TRANSFER (MISCELLANEOUS) ×1 IMPLANT
SPONGE DRAIN TRACH 4X4 STRL 2S (GAUZE/BANDAGES/DRESSINGS) IMPLANT
SPONGE T-LAP 18X18 ~~LOC~~+RFID (SPONGE) IMPLANT
STAPLER GUN LINEAR PROX 60 (STAPLE) IMPLANT
STAPLER PROXIMATE 75MM BLUE (STAPLE) IMPLANT
SURGILUBE 2OZ TUBE FLIPTOP (MISCELLANEOUS) ×1 IMPLANT
SUT ETHILON 2 0 PS N (SUTURE) IMPLANT
SUT NOVA NAB DX-16 0-1 5-0 T12 (SUTURE) ×2 IMPLANT
SUT PROLENE 2 0 KS (SUTURE) IMPLANT
SUT SILK 2 0 (SUTURE) ×1
SUT SILK 2 0 SH CR/8 (SUTURE) IMPLANT
SUT SILK 2-0 18XBRD TIE 12 (SUTURE) ×1 IMPLANT
SUT SILK 3 0 (SUTURE)
SUT SILK 3 0 SH CR/8 (SUTURE) ×1 IMPLANT
SUT SILK 3-0 18XBRD TIE 12 (SUTURE) IMPLANT
SUT VIC AB 2-0 SH 18 (SUTURE) ×1 IMPLANT
SUT VIC AB 3-0 SH 27 (SUTURE) ×1
SUT VIC AB 3-0 SH 27X BRD (SUTURE) IMPLANT
SUT VIC AB 4-0 PS2 27 (SUTURE) ×1 IMPLANT
SUT VICRYL 0 UR6 27IN ABS (SUTURE) ×1 IMPLANT
SYS LAPSCP GELPORT 120MM (MISCELLANEOUS) ×1
SYS WOUND ALEXIS 18CM MED (MISCELLANEOUS) ×1
SYSTEM LAPSCP GELPORT 120MM (MISCELLANEOUS) IMPLANT
SYSTEM WOUND ALEXIS 18CM MED (MISCELLANEOUS) ×1 IMPLANT
TOWEL OR NON WOVEN STRL DISP B (DISPOSABLE) ×1 IMPLANT
TRAY FOLEY MTR SLVR 16FR STAT (SET/KITS/TRAYS/PACK) IMPLANT
TROCAR BALLN 12MMX100 BLUNT (TROCAR) IMPLANT
TROCAR Z-THREAD OPTICAL 5X100M (TROCAR) ×1 IMPLANT
TUBING CONNECTING 10 (TUBING) ×1 IMPLANT

## 2022-04-27 NOTE — Consult Note (Signed)
[facility] Nurse wound follow up Surgical team following for assessment and plan of care. Pt did not receive an ostomy today during surgery.  No further role for WOC team. Please re-consult if further assistance is needed.  Thank-you,  Julien Girt MSN, [place], [place], [place], [place]

## 2022-04-27 NOTE — Progress Notes (Signed)
[facility] Progress Note  Day of Surgery  Subjective: No acute changes overnight.  Objective: Vital signs in last 24 hours: Temp:  [97.9 F (36.6 C)-98.9 F (37.2 C)] 97.9 F (36.6 C) (02/27 0605) Pulse Rate:  [64-97] 97 (02/27 0621) Resp:  [16-18] 18 (02/27 0621) BP: (159-190)/(78-98) 167/87 (02/27 0621) SpO2:  [91 %-96 %] 96 % (02/27 0621) Weight:  WG:[id number] kg] 117 kg (02/27 0656) Last BM Date : 04/24/22  Intake/Output from previous day: 02/26 0701 - 02/27 0700 In: 975 [I.V.:975] Out: 5000 [Urine:2300; Emesis/NG output:2700] Intake/Output this shift: No intake/output data recorded.  PE: Gen:  Alert, NAD, pleasant Card:  Regular rate and rhythm Pulm:  Normal effort ORA Abd: Soft, distended, nontender to palpation. Skin: warm and dry, no rashes  Psych: A&Ox3   Lab Results:  Recent Labs    04/25/22 1223 04/26/22 0404  WBC 11.5* 9.4  HGB 15.0 13.8  HCT 44.0 41.0  PLT 178 143*   BMET Recent Labs    04/25/22 1223 04/26/22 0404  NA 133* 135  K 4.0 3.7  CL 101 101  CO2 23 27  GLUCOSE 182* 169*  BUN 17 16  CREATININE 1.05 1.09  CALCIUM 8.8* 8.3*   PT/INR No results for input(s): "LABPROT", "INR" in the last 72 hours. CMP     Component Value Date/Time   NA 135 04/26/2022 0404   K 3.7 04/26/2022 0404   CL 101 04/26/2022 0404   CO2 27 04/26/2022 0404   GLUCOSE 169 (H) 04/26/2022 0404   BUN 16 04/26/2022 0404   CREATININE 1.09 04/26/2022 0404   CREATININE 1.11 05/23/2020 1628   CALCIUM 8.3 (L) 04/26/2022 0404   PROT 7.3 04/25/2022 1223   ALBUMIN 4.3 04/25/2022 1223   AST 22 04/25/2022 1223   ALT 24 04/25/2022 1223   ALKPHOS 44 04/25/2022 1223   BILITOT 1.9 (H) 04/25/2022 1223   GFRNONAA >60 04/26/2022 0404   Lipase     Component Value Date/Time   LIPASE 30 04/25/2022 1223     Studies/Results: DG Abd Portable 1V  Result Date: 04/25/2022 CLINICAL DATA:  NG tube placement. EXAM: PORTABLE ABDOMEN - 1 VIEW COMPARISON:  None  Available. FINDINGS: An enteric tube is noted with tip overlying the distal stomach. A large amount of stool within the visualized proximal colon noted. IMPRESSION: Enteric tube with tip overlying the distal stomach. Electronically Signed   By: Margarette Canada M.D.   On: 04/25/2022 15:08   CT ABDOMEN PELVIS W CONTRAST  Result Date: 04/25/2022 CLINICAL DATA:  Abdominal pain. * Tracking Code: BO * EXAM: CT ABDOMEN AND PELVIS WITH CONTRAST TECHNIQUE: Multidetector CT imaging of the abdomen and pelvis was performed using the standard protocol following bolus administration of intravenous contrast. RADIATION DOSE REDUCTION: This exam was performed according to the departmental dose-optimization program which includes automated exposure control, adjustment of the mA and/or kV according to patient size and/or use of iterative reconstruction technique. CONTRAST:  120m OMNIPAQUE IOHEXOL 300 MG/ML  SOLN COMPARISON:  No prior abdominal comparison is are available. FINDINGS: Lower chest: Calcified coronary artery disease of LEFT and RIGHT coronary circulation. No sign of effusion. No sign of consolidative change. Hepatobiliary: No focal, suspicious hepatic lesion. No pericholecystic stranding. No biliary duct dilation. Portal vein is patent. Hepatic steatosis. Pancreas: Normal, without mass, inflammation or ductal dilatation. Spleen: Normal. Adrenals/Urinary Tract: Adrenal glands are normal. Symmetric renal enhancement without hydronephrosis or perinephric stranding. No perivesical stranding. No suspicious renal lesion. Bosniak category II cyst  arises from the interpolar RIGHT kidney measuring approximately 1.6 cm for which no additional dedicated imaging follow-up is recommended. Stomach/Bowel: No sign of acute process related to the stomach or proximal small bowel. No sign of small bowel obstruction. Irregular enhancement of thickening of the hepatic flexure with proximal colonic dilation pericolonic stranding. Ascending  colon is filled with feces. Normal appendix. Eccentric wall thickening at the site of transition up to 2 cm is suggested. Vascular/Lymphatic: Aortic atherosclerosis. No sign of aneurysm. Smooth contour of the IVC. There is no gastrohepatic or hepatoduodenal ligament lymphadenopathy. No retroperitoneal or mesenteric lymphadenopathy. No pelvic sidewall lymphadenopathy. Reproductive: Heterogeneous prostate, nonspecific. Unremarkable CT appearance of reproductive structures otherwise to the extent evaluated. Other: Small fat containing LEFT inguinal hernia. Post RIGHT and LEFT inguinal herniorrhaphy. This may represent residual fat in the LEFT inguinal canal and should be correlated with physical exam. There is trace fluid about the RIGHT colon Musculoskeletal: No acute bone finding. No destructive bone process. Spinal degenerative changes. Degenerative changes about the symphysis pubis and bilateral hips. IMPRESSION: 1. Colonic obstruction at the level of the hepatic flexure suspicious for colonic neoplasm. GI and surgical consultation is advised. Stricture is another differential consideration though the irregular appearance of the colon is highly concerning for neoplasm. 2. Signs of colitis associated with obstruction with marked distension of the cecum, filled with stool. Ascending colon dilated to 8 cm and associated with surrounding stranding and trace fluid. Findings concerning for stercoral colitis associated with colonic obstruction. Colon may be at risk for perforation. 3. Hepatic steatosis. 4. Post RIGHT and LEFT inguinal herniorrhaphy. This may represent residual fat in the LEFT inguinal canal and should be correlated with physical exam. 5. Small fat containing LEFT inguinal hernia. 6. Calcified coronary artery disease of LEFT and RIGHT coronary circulation. 7. Aortic atherosclerosis. Aortic Atherosclerosis (ICD10-I70.0). Electronically Signed   By: Zetta Bills M.D.   On: 04/25/2022 13:50     Anti-infectives: Anti-infectives (From admission, onward)    Start     Dose/Rate Route Frequency Ordered Stop   04/27/22 0645  cefoTEtan (CEFOTAN) 2 g in sodium chloride 0.9 % 100 mL IVPB        2 g 200 mL/hr over 30 Minutes Intravenous On call to O.R. 04/27/22 ED:[id number] 04/28/22 0559      Assessment/Plan Hepatic flexure mass causing LBO  - Proceed to OR today for laparoscopic-assisted possible open right hemicolectomy. Procedure details reviewed, informed consent obtained. - Continue NG decompression - Periop abx ordered  OSA on CPAP DM2, uncontrolled, A1c 8.8%     LOS: 2 days   I reviewed nursing notes, hospitalist notes, last 24 h vitals and pain scores, last 24 h labs and trends, and last 24 h imaging results.  This care required moderate level of medical decision making.   Michaelle Birks, MD [facility] Surgery General, Hepatobiliary and Pancreatic Surgery 04/27/22 7:19 AM

## 2022-04-27 NOTE — Op Note (Signed)
Date: 04/27/22  Patient: Noah Lane MRN: [id number]  Preoperative Diagnosis: Obstructive right colon mass Postoperative Diagnosis: Same  Procedure: Laparoscopic-assisted right hemicolectomy with ileocolic reanastomosis  Surgeon: Michaelle Birks, MD Assistant: Obie Dredge, PA-C  EBL: Minimal  Anesthesia: General endotracheal  Specimens: Right colon, suture marks distal margin  Indications: Noah Lane is a 67 yo male who presented with abdominal pain and was found to have obstruction of the ascending colon, highly suspicious for malignancy. After a discussion of the risks and benefits of surgery, he consented to proceed with right hemicolectomy.  Findings: Firm mass at the hepatic flexure, highly suspicious for malignancy, with dilation of the ascending colon. Normal caliber of the small bowel. No evidence of carcinomatosis or metastatic disease within the abdomen.  Procedure details: Informed consent was obtained in the preoperative area prior to the procedure. The patient was brought to the operating room and placed on the table in the supine position. General anesthesia was induced and appropriate lines and drains were placed for intraoperative monitoring. Perioperative antibiotics were administered per SCIP guidelines. The abdomen was prepped and draped in the usual sterile fashion. A pre-procedure timeout was taken verifying patient identity, surgical site and procedure to be performed.  A vertical periumbilical skin incision was made and the subcutaneous tissue was divided with cautery. The fascia was opened along the linea alba. Previously placed permanent sutures and mesh were encountered deep to the fascia. The sutures were removed and the mesh was sharply divided. The portion of the mesh to the right of the incision was excised. There were omental adhesions to the abdominal wall, which were taken down with blunt dissection and cautery. A small wound protector was placed and a  Gelport cap, and the abdomen was insufflated. 47m ports were placed in the LLQ, RLQ and RUQ under direct visualization. The liver was inspected and no masses or nodules were noted. There were no peritoneal nodules or evidence of carcinomatosis. The right colon was visualized and was significantly dilated. The right colon was mobilized in a lateral to medial fashion along the line of Toldt using Ligasure. The hepatic flexure was then taken down with Ligasure. The gastrocolic omentum was divided close to the proximal transverse colon. At this point the right colon was freely mobile. The Gelport cap was removed and the umbilical incision was slightly extended superiorly. The right colon and proximal transverse colon were exteriorized. There was a palpable firm mass with associated dimpling of the mesentery at the hepatic flexure, highly suspicious for malignancy. The ileocolic pedicle was palpated. A mesenteric window was created on the terminal ileum about 15cm proximal to the ileocecal valve, and the ileum was transected with a 779mGIA stapler with a blue load. A mesenteric window was then created in the mid-transverse colon, distal to the right branch of the middle colic artery. The colon was transected at this point with a 756mIA stapler with a blue load. The intervening mesentery was divided with Ligasure until only the ileocolic pedicle remained. A high ligation of the ileocolic pedicle was performed with a 2-0 silk suture ligature. The specimen was removed, the distal margin marked with a suture, and sent for routine pathology. The end of the ileum was then laid adjacent to the transverse colon in antiperistaltic fashion, taking care not to twist the mesentery. An enterotomy and colotomy were created with cautery, and a 86m2mA stapler with a blue load was used to create a side-to-side antiperistaltic anastomosis. The lumen of the anastomosis was  visually inspected and the staple line was hemostatic. The  common enterotomy was closed with a TA60 blue stapler. There was bleeding along this final staple line, and this was oversewn with 3-0 silk Lembert sutures. A 3-0 silk stay suture was placed in the apex of the anastomosis. The anastomosis was palpated and was widely patent. The bowel was then placed back into the abdomen. The Gelport cap was replaced and the abdomen was insufflated. The surgical site was irrigated and appeared hemostatic. The ileocolic anastomosis lay in proper orientation in the RUQ. The omentum was placed over the anastomosis. The ports were removed and the abdomen was desufflated. The fascia at the umbilical incision was closed with interrupted 0 Novafil sutures, incorporating the remaining mesh into the closure. Scarpa's layer was closed with a running 3-0 Nylon suture, and the skin at all incision was closed with subcuticular 4-0 monocryl suture. Dermabond was applied.  The patient tolerated the procedure well with no apparent complications. All counts were correct x2 at the end of the procedure. The patient was extubated and taken to PACU in stable condition.  Michaelle Birks, MD 04/27/22 10:18 AM

## 2022-04-27 NOTE — Progress Notes (Signed)
Pt's B/P 190/78 at 2004, patient c/o pain on his abdomen and he said that he is worry about surgery. Notified to on call NP. Ordered IV morphine '1mg'$  x1 dose. Administered morphine per NP's order. Rechecked B/P-179/97. Will continue to monitor.

## 2022-04-27 NOTE — Anesthesia Procedure Notes (Signed)
Procedure Name: Intubation Date/Time: 04/27/2022 7:41 AM  Performed by: Claudia Desanctis, CRNAPre-anesthesia Checklist: Patient identified, Emergency Drugs available, Suction available and Patient being monitored Patient Re-evaluated:Patient Re-evaluated prior to induction Oxygen Delivery Method: Circle system utilized Preoxygenation: Pre-oxygenation with 100% oxygen Induction Type: Rapid sequence and Cricoid Pressure applied Laryngoscope Size: 2 and Miller Grade View: Grade II Tube type: Oral Tube size: 7.5 mm Number of attempts: 1 Airway Equipment and Method: Stylet Placement Confirmation: ETT inserted through vocal cords under direct vision, positive ETCO2 and breath sounds checked- equal and bilateral Secured at: 21 cm Tube secured with: Tape Dental Injury: Teeth and Oropharynx as per pre-operative assessment

## 2022-04-27 NOTE — Progress Notes (Signed)
PROGRESS NOTE    Noah Lane  [id number] DOB: [birth year] DOA: 04/25/2022 PCP: [name], DO   Brief Narrative: Noah Lane is a 67 y.o. male with medical history significant of T2DM, HLD, OSA on CPAP, and obesity who presents as a transfer from outside ED for colonic obstruction requiring GI and general surgery consultation.    Abdominal pain started midnight to right lower quadrant. Tried to vomit and have bowel movement without relieve and decided to go to ED. Has hx of umbilical and inguinal hernia years ago. No weight loss but has actually gained weight due to diet. Had colonoscopy probably 10 years ago in [place] and reports benign polyps that were removed.  No tobacco use. No family hx of colon cancer.   In the ED, he was afebrile and normotensive BP elevated up to 185/85.   Mild leukocytosis of 11.5, hemoglobin of 15.   Mild hyponatremia of 133, K of 4, creatinine 1.05, CBG of 182.   Colonic obstruction at the level of the hepatic flexure suspicious for colonic neoplasm.  There is also signs of colitis associated with obstruction with marked distention of the cecum.  Ascending colon dilated to a centimeter and associated with surrounding stranding and trace fluid.  Findings concerning for stercoral colitis associated with colonic obstruction.  Colon may be at risk for perforation.   EDP consulted general surgery Dr. Marlou Starks and he will see in consultation. Likely will need GI consultation as well. NG tube was placed.   Assessment & Plan:   Principal Problem:   Colonic obstruction (HCC) Active Problems:   Diabetes mellitus type 2 in obese (HCC)   OSA on CPAP   Obesity (BMI 30.0-34.9)   Hyponatremia    #1 colonic obstruction at the level of the hepatic flexure suspicious for malignancy.  Patient admitted with nausea vomiting and abdominal distention.  CT abdomen shows Signs of colitis associated with obstruction with marked distension of the cecum, filled  with stool. Ascending colon dilated to 8 cm and associated with surrounding stranding and trace fluid. Findings concerning for stercoral colitis associated with colonic obstruction. Colon may be at risk for perforation. S/p open right hemicolectomy Continue NGT Seen by GI and gen surgery  #2 dm A1c 8.8 on metformin at home  Ssi  CBG (last 3)  Recent Labs    04/27/22 0625 04/27/22 1014 04/27/22 1425  GLUCAP 171* 200* 222*    #3 hyperlipidemia on Lipitor at home  #4 obstructive sleep apnea uses CPAP at home   Estimated body mass index is 34.04 kg/m as calculated from the following:   Height as of this encounter: '6\' 1"'$  (1.854 m).   Weight as of this encounter: 117 kg.  DVT prophylaxis: Lovenox Code Status: Full code  family Communication: wife at bedside  disposition Plan:  Status is: Inpatient Remains inpatient appropriate because: Colonic obstruction with NG tube in place   Consultants:  GI and general surgery  Procedures: NG tube 04/25/2022 Antimicrobials: None  Subjective: Resting in bed NG tube in place Objective: Vitals:   04/27/22 1113 04/27/22 1217 04/27/22 1313 04/27/22 1421  BP: (!) 157/76 (!) 146/79 (!) 184/90 (!) 174/85  Pulse: 96 73 75 74  Resp: '16 18 18 18  '$ Temp: 97.8 F (36.6 C) 98.1 F (36.7 C) 98.2 F (36.8 C) (!) 97.4 F (36.3 C)  TempSrc: Oral Oral Oral Oral  SpO2: 96% 95% 97% 98%  Weight:      Height:  Intake/Output Summary (Last 24 hours) at 04/27/2022 1537 Last data filed at 04/27/2022 1400 Gross per 24 hour  Intake 1600 ml  Output 4400 ml  Net -2800 ml    Filed Weights   04/25/22 1145 04/27/22 0656  Weight: 117 kg 117 kg    Examination:  General exam: Appears restless Respiratory system: Clear to auscultation. Respiratory effort normal. Cardiovascular system: S1 & S2 heard, RRR. No JVD, murmurs, rubs, gallops or clicks. No pedal edema. Gastrointestinal system: Abdomen is distended, soft and nontender. No organomegaly  or masses felt. Normal bowel sounds heard.dressings on. Central nervous system: Alert and oriented. No focal neurological deficits. Extremities: Symmetric 5 x 5 power. Skin: No rashes, lesions or ulcers Psychiatry: Judgement and insight appear normal. Mood & affect appropriate.     Data Reviewed: I have personally reviewed following labs and imaging studies  CBC: Recent Labs  Lab 04/22/22 0808 04/25/22 1223 04/26/22 0404  WBC 7.5 11.5* 9.4  NEUTROABS  --  9.4*  --   HGB 14.7 15.0 13.8  HCT 42.9 44.0 41.0  MCV 85.0 83.0 85.6  PLT 156.0 178 143*    Basic Metabolic Panel: Recent Labs  Lab 04/22/22 0808 04/25/22 1223 04/26/22 0404  NA 133* 133* 135  K 4.2 4.0 3.7  CL 97 101 101  CO2 '28 23 27  '$ GLUCOSE 200* 182* 169*  BUN '22 17 16  '$ CREATININE 1.10 1.05 1.09  CALCIUM 9.3 8.8* 8.3*    GFR: Estimated Creatinine Clearance: 86.9 mL/min (by C-G formula based on SCr of 1.09 mg/dL). Liver Function Tests: Recent Labs  Lab 04/22/22 0808 04/25/22 1223  AST 16 22  ALT 21 24  ALKPHOS 41 44  BILITOT 1.5* 1.9*  PROT 6.7 7.3  ALBUMIN 4.6 4.3    Recent Labs  Lab 04/25/22 1223  LIPASE 30    No results for input(s): "AMMONIA" in the last 168 hours. Coagulation Profile: No results for input(s): "INR", "PROTIME" in the last 168 hours. Cardiac Enzymes: No results for input(s): "CKTOTAL", "CKMB", "CKMBINDEX", "TROPONINI" in the last 168 hours. BNP (last 3 results) No results for input(s): "PROBNP" in the last 8760 hours. HbA1C: No results for input(s): "HGBA1C" in the last 72 hours. CBG: Recent Labs  Lab 04/27/22 0625 04/27/22 1014 04/27/22 1425  GLUCAP 171* 200* 222*   Lipid Profile: No results for input(s): "CHOL", "HDL", "LDLCALC", "TRIG", "CHOLHDL", "LDLDIRECT" in the last 72 hours. Thyroid Function Tests: No results for input(s): "TSH", "T4TOTAL", "FREET4", "T3FREE", "THYROIDAB" in the last 72 hours. Anemia Panel: No results for input(s): "VITAMINB12",  "FOLATE", "FERRITIN", "TIBC", "IRON", "RETICCTPCT" in the last 72 hours. Sepsis Labs: No results for input(s): "PROCALCITON", "LATICACIDVEN" in the last 168 hours.  Recent Results (from the past 240 hour(s))  Surgical pcr screen     Status: Abnormal   Collection Time: 04/27/22  1:16 AM   Specimen: Nasal Mucosa; Nasal Swab  Result Value Ref Range Status   MRSA, PCR NEGATIVE NEGATIVE Final   Staphylococcus aureus POSITIVE (A) NEGATIVE Final    Comment: (NOTE) The Xpert SA Assay (FDA approved for NASAL specimens in patients 30 years of age and older), is one component of a comprehensive surveillance program. It is not intended to diagnose infection nor to guide or monitor treatment. Performed at [facility], [place] [address] [place], [place] [address]          Radiology Studies: No results found.      Scheduled Meds:  acetaminophen  1,000 mg Oral  TID   Chlorhexidine Gluconate Cloth  6 each Topical Q0600   [START ON 04/28/2022] enoxaparin (LOVENOX) injection  40 mg Subcutaneous Q24H   insulin aspart  0-5 Units Subcutaneous QHS   insulin aspart  0-9 Units Subcutaneous TID WC   lidocaine  1 patch Transdermal Q24H   mupirocin ointment  1 Application Nasal BID   mupirocin ointment       Continuous Infusions:  methocarbamol (ROBAXIN) IV 500 mg (04/27/22 1537)    LOS: 2 days   Time spent: 15 min  Georgette Shell, MD 04/27/2022, 3:37 PM

## 2022-04-27 NOTE — Transfer of Care (Signed)
Immediate Anesthesia Transfer of Care Note  Patient: Noah Lane  Procedure(s) Performed: LAPAROSCOPIC HAND ASSISTED RIGHT HEMI COLECTOMY, POSSIBLE OPEN  Patient Location: PACU  Anesthesia Type:General  Level of Consciousness: awake and patient cooperative  Airway & Oxygen Therapy: Patient Spontanous Breathing and Patient connected to face mask  Post-op Assessment: Report given to RN and Post -op Vital signs reviewed and stable  Post vital signs: Reviewed and stable  Last Vitals:  Vitals Value Taken Time  BP 180/81 04/27/22 1012  Temp    Pulse 72 04/27/22 1015  Resp 12 04/27/22 1015  SpO2 95 % 04/27/22 1015  Vitals shown include unvalidated device data.  Last Pain:  Vitals:   04/27/22 0605  TempSrc: Oral  PainSc:       Patients Stated Pain Goal: 2 ([id number])  Complications: No notable events documented.

## 2022-04-27 NOTE — TOC CM/SW Note (Signed)
  Transition of Care [facility]) Screening Note   Patient Details  Name: Sears Haag Date of Birth: Nov 12, 1955   Transition of Care [facility]) CM/SW Contact:    Lennart Pall, LCSW Phone Number: 04/27/2022, 3:12 PM    Transition of Care Department [facility] has reviewed patient and no TOC needs have been identified at this time. We will continue to monitor patient advancement through interdisciplinary progression rounds. If new patient transition needs arise, please place a TOC consult.

## 2022-04-28 ENCOUNTER — Encounter (HOSPITAL_COMMUNITY): Payer: Self-pay | Admitting: Surgery

## 2022-04-28 DIAGNOSIS — E876 Hypokalemia: Secondary | ICD-10-CM

## 2022-04-28 DIAGNOSIS — G4733 Obstructive sleep apnea (adult) (pediatric): Secondary | ICD-10-CM | POA: Diagnosis not present

## 2022-04-28 DIAGNOSIS — K56609 Unspecified intestinal obstruction, unspecified as to partial versus complete obstruction: Secondary | ICD-10-CM | POA: Diagnosis not present

## 2022-04-28 DIAGNOSIS — E1169 Type 2 diabetes mellitus with other specified complication: Secondary | ICD-10-CM | POA: Diagnosis not present

## 2022-04-28 DIAGNOSIS — E871 Hypo-osmolality and hyponatremia: Secondary | ICD-10-CM | POA: Diagnosis not present

## 2022-04-28 DIAGNOSIS — E785 Hyperlipidemia, unspecified: Secondary | ICD-10-CM

## 2022-04-28 LAB — GLUCOSE, CAPILLARY
Glucose-Capillary: 216 mg/dL — ABNORMAL HIGH (ref 70–99)
Glucose-Capillary: 197 mg/dL — ABNORMAL HIGH (ref 70–99)
Glucose-Capillary: 275 mg/dL — ABNORMAL HIGH (ref 70–99)
Glucose-Capillary: 162 mg/dL — ABNORMAL HIGH (ref 70–99)

## 2022-04-28 LAB — COMPREHENSIVE METABOLIC PANEL
ALT: 16 U/L (ref 0–44)
AST: 15 U/L (ref 15–41)
Albumin: 3.6 g/dL (ref 3.5–5.0)
Alkaline Phosphatase: 40 U/L (ref 38–126)
Anion gap: 11 (ref 5–15)
BUN: 24 mg/dL — ABNORMAL HIGH (ref 8–23)
CO2: 28 mmol/L (ref 22–32)
Calcium: 8 mg/dL — ABNORMAL LOW (ref 8.9–10.3)
Chloride: 99 mmol/L (ref 98–111)
Creatinine, Ser: 1.02 mg/dL (ref 0.61–1.24)
GFR, Estimated: >60 mL/min (ref >60)
Glucose, Bld: 199 mg/dL — ABNORMAL HIGH (ref 70–99)
Potassium: 3.4 mmol/L — ABNORMAL LOW (ref 3.5–5.1)
Sodium: 138 mmol/L (ref 135–145)
Total Bilirubin: 2.9 mg/dL — ABNORMAL HIGH (ref 0.3–1.2)
Total Protein: 6.5 g/dL (ref 6.5–8.1)

## 2022-04-28 LAB — MAGNESIUM: Magnesium: 2.2 mg/dL (ref 1.7–2.4)

## 2022-04-28 LAB — CBC
HCT: 42.3 % (ref 39.0–52.0)
Hemoglobin: 14 g/dL (ref 13.0–17.0)
MCH: 28.7 pg (ref 26.0–34.0)
MCHC: 33.1 g/dL (ref 30.0–36.0)
MCV: 86.7 fL (ref 80.0–100.0)
Platelets: 186 10*3/uL (ref 150–400)
RBC: 4.88 MIL/uL (ref 4.22–5.81)
RDW: 13.1 % (ref 11.5–15.5)
WBC: 8 10*3/uL (ref 4.0–10.5)
nRBC: 0 % (ref 0.0–0.2)

## 2022-04-28 MED ORDER — POTASSIUM CHLORIDE 10 MEQ/100ML IV SOLN
10.0000 meq | Freq: Once | INTRAVENOUS | Status: AC
  Administered 2022-04-28: 10 meq via INTRAVENOUS
  Filled 2022-04-28: qty 100

## 2022-04-28 MED ORDER — POTASSIUM CHLORIDE IN NACL 20-0.45 MEQ/L-% IV SOLN
INTRAVENOUS | Status: DC
  Filled 2022-04-28 (×3): qty 1000

## 2022-04-28 MED ORDER — INSULIN GLARGINE-YFGN 100 UNIT/ML ~~LOC~~ SOLN
5.0000 [IU] | Freq: Every day | SUBCUTANEOUS | Status: DC
  Administered 2022-04-28: 5 [IU] via SUBCUTANEOUS
  Filled 2022-04-28 (×2): qty 0.05

## 2022-04-28 MED ORDER — POTASSIUM CHLORIDE 10 MEQ/100ML IV SOLN
10.0000 meq | INTRAVENOUS | Status: AC
  Administered 2022-04-28 (×2): 10 meq via INTRAVENOUS
  Filled 2022-04-28 (×2): qty 100

## 2022-04-28 NOTE — Progress Notes (Signed)
PROGRESS NOTE    Noah Lane  [id number] DOB: May 13, 1955 DOA: 04/25/2022 PCP: [name], DO    Chief Complaint  Patient presents with   Abdominal Pain    Brief Narrative:  Patient pleasant 67 year old gentleman history of type 2 diabetes, hyperlipidemia, OSA on CPAP, obesity presented with abdominal pain and distention, right lower quadrant, no bowel movements on the day of admission. Patient underwent CT scan done concerning for near obstructing mass at the hepatic flexure worrisome for colon cancer.  Loss of stool proximal to this area.  No evidence of perforation.  Patient admitted.  General surgery consulted who recommended GI input.  Patient assessed by GI who reviewed films, patient noted to have a high-grade colonic obstruction of the hepatic flexure concerning for malignancy with ascending colon dilatation, not a candidate for bowel prep due to high-grade obstruction with proximal colon dilation, no role for colonoscopy of stent and recommended surgical management. Patient subsequently underwent laparoscopic assisted right hemicolectomy with ileocolic reanastomosis by general surgery on 04/27/2022.   Assessment & Plan:   Principal Problem:   Colonic obstruction (HCC) Active Problems:   Diabetes mellitus type 2 in obese (HCC)   OSA on CPAP   Obesity (BMI 30.0-34.9)   Hyponatremia   Hypokalemia   Hyperlipidemia  #1 colonic obstruction at level of hepatic flexure suspicious for malignancy -Patient had presented with right lower quadrant abdominal pain, no bowel movement, seen in the ED CT abdomen and pelvis done concerning for near obstructing mass at the hepatic flexure concerning for colon cancer. -Patient admitted, seen in consultation by general surgery who recommended evaluation by GI prior to any surgical input. -Patient seen in consultation by GI who reviewed films and felt patient had a high-grade colonic obstruction at hepatic flexure concerning for  malignancy and the ascending colonic dilatation, not a candidate for bowel prep due to high-grade obstruction with proximal colon dilatation, no role for colonoscopy or stenting regimen commended surgical management. -Patient subsequently underwent laparoscopic assisted right hemicolectomy with ileocolic reanastomosis per Dr. [name] 04/27/2022.  Biopsies pending. -Patient followed by general surgery, NG tube removed, patient placed on clear liquids. -Replete electrolytes. -Mobilize. -Per general surgery.  2.  Diabetes mellitus type 2 -Hemoglobin A1c 8.8 (04/22/2022) -CBG 216 this morning. -Hold oral hypoglycemic agents. -Start Semglee 5 units daily. -Continue SSI.  3.  Hyperlipidemia -Continue to hold statin. -May resume on discharge.  4.  OSA -Noted to be on CPAP at home.  5.  Hypokalemia -Currently receiving 3 rounds of KCl IV. -Magnesium at 2.2. -Repeat labs in the AM.  6.  Hyponatremia -Resolved.      DVT prophylaxis: Lovenox Code Status: Full Family Communication: Updated patient.  No family at bedside. Disposition: Home when clinically improved, tolerating oral intake, cleared by general surgery.  Status is: Inpatient Remains inpatient appropriate because: Severity of illness   Consultants:  General surgery: [name] 04/25/2022 General surgery: [name] Plan 04/26/2022 Wound care RN: [name] 04/26/2022  Procedures:  CT abdomen and pelvis 04/25/2022 Abdominal films 04/25/2022 Laparoscopic assisted right hemicolectomy with ileocolic reanastomosis per [name] 04/27/2022  Antimicrobials:  Anti-infectives (From admission, onward)    Start     Dose/Rate Route Frequency Ordered Stop   04/27/22 0645  cefoTEtan (CEFOTAN) 2 g in sodium chloride 0.9 % 100 mL IVPB        2 g 200 mL/hr over 30 Minutes Intravenous On call to O.R. 04/27/22 [id number] 04/27/22 [id number]  Subjective: Laying in bed.  NG tube removed this morning.  No chest pain.  No shortness of  breath.  No significant abdominal pain.  No flatus.  No bowel movement.  Stated had a popsicle this morning which he tolerated well and was very satisfied.  Objective: Vitals:   04/28/22 0204 04/28/22 0439 04/28/22 0932 04/28/22 1333  BP: (!) 147/76 (!) 164/78 (!) 150/74 (!) 149/73  Pulse: 70 71 76 75  Resp: '16 18 18 18  '$ Temp:  97.7 F (36.5 C) 98.6 F (37 C) 97.9 F (36.6 C)  TempSrc:  Oral Oral Oral  SpO2: 95% 96% 100% 95%  Weight:      Height:        Intake/Output Summary (Last 24 hours) at 04/28/2022 1848 Last data filed at 04/28/2022 1800 Gross per 24 hour  Intake 590 ml  Output 2650 ml  Net -2060 ml   Filed Weights   04/25/22 1145 04/27/22 0656  Weight: 117 kg 117 kg    Examination:  General exam: Appears calm and comfortable  Respiratory system: Clear to auscultation.  No wheezes, no crackles, no rhonchi.  Fair air movement.  Speaking in full sentences.  Respiratory effort normal. Cardiovascular system: S1 & S2 heard, RRR. No JVD, murmurs, rubs, gallops or clicks. No pedal edema. Gastrointestinal system: Abdomen is nondistended, soft and nontender.  Hypoactive bowel sounds.  Incision site c/d/i. No organomegaly or masses felt. Central nervous system: Alert and oriented. No focal neurological deficits. Extremities: Symmetric 5 x 5 power. Skin: No rashes, lesions or ulcers Psychiatry: Judgement and insight appear normal. Mood & affect appropriate.     Data Reviewed: I have personally reviewed following labs and imaging studies  CBC: Recent Labs  Lab 04/22/22 0808 04/25/22 1223 04/26/22 0404 04/28/22 0423  WBC 7.5 11.5* 9.4 8.0  NEUTROABS  --  9.4*  --   --   HGB 14.7 15.0 13.8 14.0  HCT 42.9 44.0 41.0 42.3  MCV 85.0 83.0 85.6 86.7  PLT 156.0 178 143* [id number]    Basic Metabolic Panel: Recent Labs  Lab 04/22/22 0808 04/25/22 1223 04/26/22 0404 04/28/22 0423  NA 133* 133* 135 138  K 4.2 4.0 3.7 3.4*  CL 97 101 101 99  CO2 '28 23 27 28  '$ GLUCOSE 200*  182* 169* 199*  BUN '22 17 16 '$ 24*  CREATININE 1.10 1.05 1.09 1.02  CALCIUM 9.3 8.8* 8.3* 8.0*  MG  --   --   --  2.2    GFR: Estimated Creatinine Clearance: 92.8 mL/min (by C-G formula based on SCr of 1.02 mg/dL).  Liver Function Tests: Recent Labs  Lab 04/22/22 0808 04/25/22 1223 04/28/22 0423  AST '16 22 15  '$ ALT '21 24 16  '$ ALKPHOS 41 44 40  BILITOT 1.5* 1.9* 2.9*  PROT 6.7 7.3 6.5  ALBUMIN 4.6 4.3 3.6    CBG: Recent Labs  Lab 04/27/22 1014 04/27/22 1425 04/28/22 0750 04/28/22 1144 04/28/22 1617  GLUCAP 200* 222* 216* 197* 275*     Recent Results (from the past 240 hour(s))  Surgical pcr screen     Status: Abnormal   Collection Time: 04/27/22  1:16 AM   Specimen: Nasal Mucosa; Nasal Swab  Result Value Ref Range Status   MRSA, PCR NEGATIVE NEGATIVE Final   Staphylococcus aureus POSITIVE (A) NEGATIVE Final    Comment: (NOTE) The Xpert SA Assay (FDA approved for NASAL specimens in patients 46 years of age and older), is one component of a comprehensive surveillance  program. It is not intended to diagnose infection nor to guide or monitor treatment. Performed at [facility], [facility] [address] [place], [place] [address]          Radiology Studies: No results found.      Scheduled Meds:  acetaminophen  1,000 mg Oral TID   Chlorhexidine Gluconate Cloth  6 each Topical Q0600   enoxaparin (LOVENOX) injection  40 mg Subcutaneous Q24H   insulin aspart  0-5 Units Subcutaneous QHS   insulin aspart  0-9 Units Subcutaneous TID WC   insulin glargine-yfgn  5 Units Subcutaneous Daily   lidocaine  1 patch Transdermal Q24H   mupirocin ointment  1 Application Nasal BID   Continuous Infusions:  0.45 % NaCl with KCl 20 mEq / L 100 mL/hr at 04/28/22 1736   methocarbamol (ROBAXIN) IV 500 mg (04/28/22 1557)     LOS: 3 days    Time spent: 40 minutes    Irine Seal, MD [facility]   To contact the attending provider between  7A-7P or the covering provider during after hours 7P-7A, please log into the web site [email or web address] and access using universal [place] password for that web site. If you do not have the password, please call the hospital operator.  04/28/2022, 6:48 PM

## 2022-04-28 NOTE — Progress Notes (Signed)
Mobility Specialist - Progress Note   04/28/22 1302  Mobility  Activity Ambulated with assistance in hallway  Level of Assistance Modified independent, requires aide device or extra time  Assistive Device Front wheel walker  Distance Ambulated (ft) 500 ft  Activity Response Tolerated well  Mobility Referral Yes  $Mobility charge 1 Mobility   Pt received in bed and agreeable to mobility. No complaints during session. Pt to bed after session with all needs met.    [facility]

## 2022-04-28 NOTE — Progress Notes (Signed)
[facility] Progress Note  1 Day Post-Op  Subjective: CC:  Reports incisional pain. Denies flatus or BM. Has not been OOB since surgery. Reports throat discomfort and poor sleep due to NGT. Says he has had 8-10 cups of ice since yesterday.   Objective: Vital signs in last 24 hours: Temp:  [97.4 F (36.3 C)-99 F (37.2 C)] 97.7 F (36.5 C) (02/28 0439) Pulse Rate:  [70-96] 71 (02/28 0439) Resp:  [11-18] 18 (02/28 0439) BP: (146-184)/(72-90) 164/78 (02/28 0439) SpO2:  [94 %-99 %] 96 % (02/28 0439) FiO2 (%):  [21 %] 21 % (02/27 2008) Last BM Date : 04/24/22  Intake/Output from previous day: 02/27 0701 - 02/28 0700 In: 810 [I.V.:600; IV Piggyback:210] Out: 3050 [Urine:1600; Emesis/NG output:1400; Blood:50] Intake/Output this shift: No intake/output data recorded.  PE: Gen:  Alert, NAD, pleasant Card:  Regular rate and rhythm Pulm:  Normal effort non-labored on ORA  Abd: Soft, appropriately tender, mild distention, incisions c/d/I without drainage. Mild ecchymosis periumbilical incision Skin: warm and dry, no rashes  Psych: A&Ox3   Lab Results:  Recent Labs    04/26/22 0404 04/28/22 0423  WBC 9.4 8.0  HGB 13.8 14.0  HCT 41.0 42.3  PLT 143* 186   BMET Recent Labs    04/26/22 0404 04/28/22 0423  NA 135 138  K 3.7 3.4*  CL 101 99  CO2 27 28  GLUCOSE 169* 199*  BUN 16 24*  CREATININE 1.09 1.02  CALCIUM 8.3* 8.0*   PT/INR No results for input(s): "LABPROT", "INR" in the last 72 hours. CMP     Component Value Date/Time   NA 138 04/28/2022 0423   K 3.4 (L) 04/28/2022 0423   CL 99 04/28/2022 0423   CO2 28 04/28/2022 0423   GLUCOSE 199 (H) 04/28/2022 0423   BUN 24 (H) 04/28/2022 0423   CREATININE 1.02 04/28/2022 0423   CREATININE 1.11 05/23/2020 1628   CALCIUM 8.0 (L) 04/28/2022 0423   PROT 6.5 04/28/2022 0423   ALBUMIN 3.6 04/28/2022 0423   AST 15 04/28/2022 0423   ALT 16 04/28/2022 0423   ALKPHOS 40 04/28/2022 0423   BILITOT 2.9 (H)  04/28/2022 0423   GFRNONAA >60 04/28/2022 0423   Lipase     Component Value Date/Time   LIPASE 30 04/25/2022 1223   Anti-infectives: Anti-infectives (From admission, onward)    Start     Dose/Rate Route Frequency Ordered Stop   04/27/22 0645  cefoTEtan (CEFOTAN) 2 g in sodium chloride 0.9 % 100 mL IVPB        2 g 200 mL/hr over 30 Minutes Intravenous On call to O.R. 04/27/22 0644 04/27/22 0802        Assessment/Plan Hepatic flexure mass causing LBO   POD#1 s/p Laparoscopic-assisted right hemicolectomy with ileocolic reanastomosis [id number] Dr. [name]  - AFVSS - NG output listed as 1,100 mL but it is clear. D/C NG tube and allow ice chips. If tolerating then will advance to CLD or FLD this afternoon. - await surgical path  FEN: NPO, IVF, ok for ice chips; K 3.4, give 3 runs IV KCl this AM ID: perioperative cefotetan 2/27 VTE: SCD's,  lovenox Foley: D/C foley today, POD#1 Dispo: med surg, surgical planning    OSA on CPAP DM2, uncontrolled, A1c 8.8%  HTN    LOS: 3 days   I reviewed nursing notes, hospitalist notes, last 24 h vitals and pain scores, last 48 h intake and output, last 24 h labs and trends, and last  24 h imaging results.    Obie Dredge, PA-C [facility] Please see Amion for pager number during day hours 7:00am-4:30pm

## 2022-04-28 NOTE — Care Management Important Message (Signed)
Important Message  Patient Details IM Letter given. Name: Noah Lane Duty MRN: [id number] Date of Birth: Nov 07, 1955   Medicare Important Message Given:  Yes     Kerin Salen 04/28/2022, 10:51 AM

## 2022-04-29 DIAGNOSIS — K219 Gastro-esophageal reflux disease without esophagitis: Secondary | ICD-10-CM

## 2022-04-29 DIAGNOSIS — K567 Ileus, unspecified: Secondary | ICD-10-CM

## 2022-04-29 DIAGNOSIS — E1169 Type 2 diabetes mellitus with other specified complication: Secondary | ICD-10-CM | POA: Diagnosis not present

## 2022-04-29 DIAGNOSIS — K9189 Other postprocedural complications and disorders of digestive system: Secondary | ICD-10-CM

## 2022-04-29 DIAGNOSIS — G4733 Obstructive sleep apnea (adult) (pediatric): Secondary | ICD-10-CM | POA: Diagnosis not present

## 2022-04-29 DIAGNOSIS — K56609 Unspecified intestinal obstruction, unspecified as to partial versus complete obstruction: Secondary | ICD-10-CM | POA: Diagnosis not present

## 2022-04-29 DIAGNOSIS — E871 Hypo-osmolality and hyponatremia: Secondary | ICD-10-CM | POA: Diagnosis not present

## 2022-04-29 LAB — CBC WITH DIFFERENTIAL/PLATELET
Abs Immature Granulocytes: 0.03 10*3/uL (ref 0.00–0.07)
Basophils Absolute: 0 10*3/uL (ref 0.0–0.1)
Basophils Relative: 0 %
Eosinophils Absolute: 0.1 10*3/uL (ref 0.0–0.5)
Eosinophils Relative: 1 %
HCT: 40.8 % (ref 39.0–52.0)
Hemoglobin: 13.3 g/dL (ref 13.0–17.0)
Immature Granulocytes: 0 %
Lymphocytes Relative: 9 %
Lymphs Abs: 0.7 10*3/uL (ref 0.7–4.0)
MCH: 28.2 pg (ref 26.0–34.0)
MCHC: 32.6 g/dL (ref 30.0–36.0)
MCV: 86.6 fL (ref 80.0–100.0)
Monocytes Absolute: 0.9 10*3/uL (ref 0.1–1.0)
Monocytes Relative: 12 %
Neutro Abs: 6 10*3/uL (ref 1.7–7.7)
Neutrophils Relative %: 78 %
Platelets: 169 10*3/uL (ref 150–400)
RBC: 4.71 MIL/uL (ref 4.22–5.81)
RDW: 13.2 % (ref 11.5–15.5)
WBC: 7.8 10*3/uL (ref 4.0–10.5)
nRBC: 0 % (ref 0.0–0.2)

## 2022-04-29 LAB — CREATININE, URINE, RANDOM: Creatinine, Urine: 155 mg/dL

## 2022-04-29 LAB — COMPREHENSIVE METABOLIC PANEL
ALT: 15 U/L (ref 0–44)
AST: 18 U/L (ref 15–41)
Albumin: 3.4 g/dL — ABNORMAL LOW (ref 3.5–5.0)
Alkaline Phosphatase: 44 U/L (ref 38–126)
Anion gap: 8 (ref 5–15)
BUN: 20 mg/dL (ref 8–23)
CO2: 26 mmol/L (ref 22–32)
Calcium: 7.7 mg/dL — ABNORMAL LOW (ref 8.9–10.3)
Chloride: 95 mmol/L — ABNORMAL LOW (ref 98–111)
Creatinine, Ser: 0.94 mg/dL (ref 0.61–1.24)
GFR, Estimated: >60 mL/min (ref >60)
Glucose, Bld: 186 mg/dL — ABNORMAL HIGH (ref 70–99)
Potassium: 3.9 mmol/L (ref 3.5–5.1)
Sodium: 129 mmol/L — ABNORMAL LOW (ref 135–145)
Total Bilirubin: 4.7 mg/dL — ABNORMAL HIGH (ref 0.3–1.2)
Total Protein: 6.5 g/dL (ref 6.5–8.1)

## 2022-04-29 LAB — URINALYSIS, ROUTINE W REFLEX MICROSCOPIC
Bilirubin Urine: NEGATIVE
Glucose, UA: 50 mg/dL — AB
Ketones, ur: 20 mg/dL — AB
Leukocytes,Ua: NEGATIVE
Nitrite: NEGATIVE
Protein, ur: 100 mg/dL — AB
Specific Gravity, Urine: 1.028 (ref 1.005–1.030)
pH: 6 (ref 5.0–8.0)

## 2022-04-29 LAB — GLUCOSE, CAPILLARY
Glucose-Capillary: 172 mg/dL — ABNORMAL HIGH (ref 70–99)
Glucose-Capillary: 195 mg/dL — ABNORMAL HIGH (ref 70–99)
Glucose-Capillary: 185 mg/dL — ABNORMAL HIGH (ref 70–99)
Glucose-Capillary: 189 mg/dL — ABNORMAL HIGH (ref 70–99)

## 2022-04-29 LAB — OSMOLALITY, URINE: Osmolality, Ur: 839 mOsm/kg (ref 300–900)

## 2022-04-29 LAB — SODIUM, URINE, RANDOM: Sodium, Ur: 57 mmol/L

## 2022-04-29 LAB — MAGNESIUM: Magnesium: 2.3 mg/dL (ref 1.7–2.4)

## 2022-04-29 LAB — OSMOLALITY: Osmolality: 294 mOsm/kg (ref 275–295)

## 2022-04-29 MED ORDER — SODIUM CHLORIDE 0.9 % IV SOLN: INTRAVENOUS | Status: DC

## 2022-04-29 MED ORDER — ALUM & MAG HYDROXIDE-SIMETH 200-200-20 MG/5ML PO SUSP
30.0000 mL | Freq: Once | ORAL | Status: AC
  Administered 2022-04-29: 30 mL via ORAL
  Filled 2022-04-29: qty 30

## 2022-04-29 MED ORDER — ENSURE ENLIVE PO LIQD
237.0000 mL | Freq: Three times a day (TID) | ORAL | Status: DC
  Administered 2022-04-29 – 2022-05-04 (×12): 237 mL via ORAL

## 2022-04-29 MED ORDER — POTASSIUM CHLORIDE IN NACL 40-0.9 MEQ/L-% IV SOLN
INTRAVENOUS | Status: DC
  Administered 2022-04-30: 100 mL/h via INTRAVENOUS
  Filled 2022-04-29 (×5): qty 1000

## 2022-04-29 MED ORDER — METOPROLOL TARTRATE 5 MG/5ML IV SOLN
5.0000 mg | Freq: Three times a day (TID) | INTRAVENOUS | Status: DC
  Administered 2022-04-29 – 2022-04-30 (×4): 5 mg via INTRAVENOUS
  Filled 2022-04-29 (×4): qty 5

## 2022-04-29 MED ORDER — PANTOPRAZOLE SODIUM 40 MG IV SOLR
40.0000 mg | INTRAVENOUS | Status: DC
  Administered 2022-04-29 – 2022-05-04 (×6): 40 mg via INTRAVENOUS
  Filled 2022-04-29 (×6): qty 10

## 2022-04-29 MED ORDER — INSULIN GLARGINE-YFGN 100 UNIT/ML ~~LOC~~ SOLN
8.0000 [IU] | Freq: Every day | SUBCUTANEOUS | Status: DC
  Administered 2022-04-29: 8 [IU] via SUBCUTANEOUS
  Filled 2022-04-29 (×2): qty 0.08

## 2022-04-29 MED ORDER — LIDOCAINE VISCOUS HCL 2 % MT SOLN
15.0000 mL | Freq: Once | OROMUCOSAL | Status: DC
  Filled 2022-04-29: qty 15

## 2022-04-29 MED ORDER — SIMETHICONE 80 MG PO CHEW
160.0000 mg | CHEWABLE_TABLET | Freq: Four times a day (QID) | ORAL | Status: AC
  Administered 2022-04-29 – 2022-05-02 (×12): 160 mg via ORAL
  Filled 2022-04-29 (×12): qty 2

## 2022-04-29 MED ORDER — FAMOTIDINE IN NACL 20-0.9 MG/50ML-% IV SOLN
20.0000 mg | Freq: Two times a day (BID) | INTRAVENOUS | Status: DC
  Administered 2022-04-29 – 2022-05-01 (×5): 20 mg via INTRAVENOUS
  Filled 2022-04-29 (×5): qty 50

## 2022-04-29 MED ORDER — POTASSIUM CHLORIDE 20 MEQ PO PACK
60.0000 meq | PACK | Freq: Once | ORAL | Status: AC
  Administered 2022-04-29: 60 meq via ORAL
  Filled 2022-04-29: qty 3

## 2022-04-29 NOTE — Anesthesia Postprocedure Evaluation (Signed)
Anesthesia Post Note  Patient: Noah Lane  Procedure(s) Performed: LAPAROSCOPIC HAND ASSISTED RIGHT HEMI COLECTOMY, POSSIBLE OPEN     Patient location during evaluation: PACU Anesthesia Type: General Level of consciousness: awake and alert Pain management: pain level controlled Vital Signs Assessment: post-procedure vital signs reviewed and stable Respiratory status: spontaneous breathing, nonlabored ventilation, respiratory function stable and patient connected to nasal cannula oxygen Cardiovascular status: blood pressure returned to baseline and stable Postop Assessment: no apparent nausea or vomiting Anesthetic complications: no  No notable events documented.  Last Vitals:  Vitals:   04/29/22 0430 04/29/22 1152  BP: (!) 163/83 (!) 148/84  Pulse: 77 75  Resp: 18 17  Temp: 37.4 C 37.4 C  SpO2: 100% 98%    Last Pain:  Vitals:   04/29/22 1152  TempSrc: Oral  PainSc:                  [place] [place]

## 2022-04-29 NOTE — Progress Notes (Signed)
Mobility Specialist - Progress Note   04/29/22 0909  Mobility  Activity Ambulated with assistance in hallway  Level of Assistance Modified independent, requires aide device or extra time  Assistive Device Front wheel walker  Distance Ambulated (ft) 910 ft  Activity Response Tolerated well  Mobility Referral Yes  $Mobility charge 1 Mobility   Pt received in bed and agreeable to mobility. Pt c/o abdomen pain. Pt had several burps throughout ambulation, but stated he was okay. No complaints during session. Pt to bed after session with all needs met.    [facility]

## 2022-04-29 NOTE — Progress Notes (Addendum)
[facility] Progress Note  2 Days Post-Op  Subjective: CC:  Reports incisional pain. Denies flatus or BM. Reports feeling like he has a lot of trapped gas. Motivated to get up and walk. Denies urinary sxs. Denies nausea or vomiting but does report acid reflux.   Objective: Vital signs in last 24 hours: Temp:  [97.9 F (36.6 C)-99.4 F (37.4 C)] 99.4 F (37.4 C) (02/29 0430) Pulse Rate:  [72-77] 77 (02/29 0430) Resp:  [16-18] 18 (02/29 0430) BP: (144-163)/(72-83) 163/83 (02/29 0430) SpO2:  [94 %-100 %] 100 % (02/29 0430) FiO2 (%):  [21 %] 21 % (02/28 1958) Last BM Date : 04/24/22  Intake/Output from previous day: 02/28 0701 - 02/29 0700 In: 2499.9 [P.O.:1200; I.V.:1189.9; IV Piggyback:110] Out: 1250 [Urine:1250] Intake/Output this shift: No intake/output data recorded.  PE: Gen:  Alert, NAD, pleasant Card:  Regular rate and rhythm Pulm:  Normal effort non-labored on ORA  Abd: Soft, appropriately tender, mild distention, incisions c/d/I without drainage. Skin: warm and dry, no rashes  Psych: A&Ox3   Lab Results:  Recent Labs    04/28/22 0423 04/29/22 0416  WBC 8.0 7.8  HGB 14.0 13.3  HCT 42.3 40.8  PLT 186 169   BMET Recent Labs    04/28/22 0423 04/29/22 0602  NA 138 129*  K 3.4* 3.9  CL 99 95*  CO2 28 26  GLUCOSE 199* 186*  BUN 24* 20  CREATININE 1.02 0.94  CALCIUM 8.0* 7.7*   PT/INR No results for input(s): "LABPROT", "INR" in the last 72 hours. CMP     Component Value Date/Time   NA 129 (L) 04/29/2022 0602   K 3.9 04/29/2022 0602   CL 95 (L) 04/29/2022 0602   CO2 26 04/29/2022 0602   GLUCOSE 186 (H) 04/29/2022 0602   BUN 20 04/29/2022 0602   CREATININE 0.94 04/29/2022 0602   CREATININE 1.11 05/23/2020 1628   CALCIUM 7.7 (L) 04/29/2022 0602   PROT 6.5 04/29/2022 0602   ALBUMIN 3.4 (L) 04/29/2022 0602   AST 18 04/29/2022 0602   ALT 15 04/29/2022 0602   ALKPHOS 44 04/29/2022 0602   BILITOT 4.7 (H) 04/29/2022 0602   GFRNONAA  >60 04/29/2022 0602   Lipase     Component Value Date/Time   LIPASE 30 04/25/2022 1223   Anti-infectives: Anti-infectives (From admission, onward)    Start     Dose/Rate Route Frequency Ordered Stop   04/27/22 0645  cefoTEtan (CEFOTAN) 2 g in sodium chloride 0.9 % 100 mL IVPB        2 g 200 mL/hr over 30 Minutes Intravenous On call to O.R. 04/27/22 0644 04/27/22 0802        Assessment/Plan Hepatic flexure mass causing LBO   POD#2 s/p Laparoscopic-assisted right hemicolectomy with ileocolic reanastomosis [id number] Dr. [name]  - AFVSS, WBC 8.0, hgb stable - clinically he has an ileus. Advance to FLD and await further bowel function. Start ensure.  - PT ordered  - await surgical path  FEN: FLD, ensure, hypokalemia 3.4 replete PO today with 60 mEq KCl, Mg is > 2.0; IVF per primary ID: perioperative cefotetan 2/27 VTE: SCD's,  lovenox Foley: removed 2/28, spont voids Dispo: med surg,    OSA on CPAP DM2, uncontrolled, A1c 8.8%  HTN    LOS: 4 days   I reviewed nursing notes, hospitalist notes, last 24 h vitals and pain scores, last 48 h intake and output, last 24 h labs and trends, and last 24 h imaging results.  Obie Dredge, PA-C [facility] Please see Amion for pager number during day hours 7:00am-4:30pm

## 2022-04-29 NOTE — Progress Notes (Signed)
PROGRESS NOTE    Noah Lane  [id number] DOB: [birth year] DOA: 04/25/2022 PCP: [name], DO    Chief Complaint  Patient presents with   Abdominal Pain    Brief Narrative:  Patient pleasant 67 year old gentleman history of type 2 diabetes, hyperlipidemia, OSA on CPAP, obesity presented with abdominal pain and distention, right lower quadrant, no bowel movements on the day of admission. Patient underwent CT scan done concerning for near obstructing mass at the hepatic flexure worrisome for colon cancer.  Loss of stool proximal to this area.  No evidence of perforation.  Patient admitted.  General surgery consulted who recommended GI input.  Patient assessed by GI who reviewed films, patient noted to have a high-grade colonic obstruction of the hepatic flexure concerning for malignancy with ascending colon dilatation, not a candidate for bowel prep due to high-grade obstruction with proximal colon dilation, no role for colonoscopy of stent and recommended surgical management. Patient subsequently underwent laparoscopic assisted right hemicolectomy with ileocolic reanastomosis by general surgery on 04/27/2022.   Assessment & Plan:   Principal Problem:   Colonic obstruction (HCC) Active Problems:   Diabetes mellitus type 2 in obese (HCC)   OSA on CPAP   Obesity (BMI 30.0-34.9)   Hyponatremia   Hypokalemia   Hyperlipidemia   Gastroesophageal reflux disease   Ileus, postoperative (HCC)  #1 colonic obstruction at level of hepatic flexure suspicious for malignancy -Patient had presented with right lower quadrant abdominal pain, no bowel movement, seen in the ED CT abdomen and pelvis done concerning for near obstructing mass at the hepatic flexure concerning for colon cancer. -Patient admitted, seen in consultation by general surgery who recommended evaluation by GI prior to any surgical input. -Patient seen in consultation by GI who reviewed films and felt patient had a  high-grade colonic obstruction at hepatic flexure concerning for malignancy and the ascending colonic dilatation, not a candidate for bowel prep due to high-grade obstruction with proximal colon dilatation, no role for colonoscopy or stenting regimen commended surgical management. -Patient subsequently underwent laparoscopic assisted right hemicolectomy with ileocolic reanastomosis per Dr. [name] 04/27/2022.  Biopsies pending. -Patient followed by general surgery, NG tube removed, patient placed on clear liquids. -Patient with no bowel movement, no flatus, complaining of reflux symptoms this morning, feels lots of gas is trapped concern for possible postop ileus. -Diet advanced to full liquid diet per general surgery. -Keep potassium approximately 4, magnesium approximately 2. -Mobilize. -Per general surgery.  Per   -#2 probable postop ileus -Patient with complaints of reflux symptoms, abdominal bloating, feels like trapped gas.  No bowel movement, no flatus. -Keep potassium approximately 4, magnesium approximately 2. -Patient being followed by general surgery diet advanced to a full liquid diet. -Per general surgery if no significant improvement may need NG tube placement for decompression. -Mobilize.  3.  Diabetes mellitus type 2 -Hemoglobin A1c 8.8 (04/22/2022) -CBG 172 this morning. -Continue to hold oral hypoglycemic agents.   -Increase Semglee to 8 units daily.   -SSI.   4.  Hyperlipidemia -Continue to hold statin. -May resume on discharge.  5.  GERD -GI cocktail x 1. -IV PPI daily while awaiting for bowel function to return.  6.  OSA -Noted to be on CPAP at home.  7.  Hypokalemia -Status post IV KCl. -Potassium at 3.9 this morning.  Magnesium at 2.3.  -Follow.  8.  Hyponatremia -Sodium currently at 129.  -Change IV fluids from half-normal saline with KCl to normal saline with KCl. -Repeat labs in  the AM.      DVT prophylaxis: Lovenox Code Status: Full Family  Communication: Updated patient.  No family at bedside. Disposition: Home when clinically improved, tolerating oral intake, cleared by general surgery.  Status is: Inpatient Remains inpatient appropriate because: Severity of illness   Consultants:  General surgery: [name] 04/25/2022 General surgery: [name] Plan 04/26/2022 Wound care RN: [name] 04/26/2022  Procedures:  CT abdomen and pelvis 04/25/2022 Abdominal films 04/25/2022 Laparoscopic assisted right hemicolectomy with ileocolic reanastomosis per [name] 04/27/2022  Antimicrobials:  Anti-infectives (From admission, onward)    Start     Dose/Rate Route Frequency Ordered Stop   04/27/22 0645  cefoTEtan (CEFOTAN) 2 g in sodium chloride 0.9 % 100 mL IVPB        2 g 200 mL/hr over 30 Minutes Intravenous On call to O.R. 04/27/22 [id number] 04/27/22 0802         Subjective: Patient laying in bed, CPAP on.  Complains of heartburn/reflux this morning which he states GI cocktail seems to be helping.  Complains of some gas.  No bowel movement.  No flatus.  Some abdominal discomfort states just received pain medication.    Objective: Vitals:   04/28/22 2002 04/29/22 0149 04/29/22 0430 04/29/22 1152  BP: (!) 151/72 (!) 144/77 (!) 163/83 (!) 148/84  Pulse: 76 72 77 75  Resp: '16 18 18 17  '$ Temp: 99.4 F (37.4 C) 98.9 F (37.2 C) 99.4 F (37.4 C) 99.3 F (37.4 C)  TempSrc: Oral Oral Oral Oral  SpO2: 97% 94% 100% 98%  Weight:      Height:        Intake/Output Summary (Last 24 hours) at 04/29/2022 1744 Last data filed at 04/29/2022 1731 Gross per 24 hour  Intake 3750.52 ml  Output 1200 ml  Net 2550.52 ml   Filed Weights   04/25/22 1145 04/27/22 0656  Weight: 117 kg 117 kg    Examination:  General exam: NAD. Respiratory system: CTAB.  No wheezes, no crackles, no rhonchi.  Fair air movement.  Speaking in full sentences.  Cardiovascular system: Regular rate rhythm no murmurs rubs or gallops.  No JVD.  No lower extremity  edema.   Gastrointestinal system: Abdomen is mildly distended, soft, some tenderness to palpation however received pain medication, hypoactive bowel sounds.  No rebound, no guarding.  Incision site c/d/I.  Central nervous system: Alert and oriented.  Moving extremities spontaneously.  No focal neurological deficits. Extremities: Symmetric 5 x 5 power. Skin: No rashes, lesions or ulcers Psychiatry: Judgement and insight appear normal. Mood & affect appropriate.     Data Reviewed: I have personally reviewed following labs and imaging studies  CBC: Recent Labs  Lab 04/25/22 1223 04/26/22 0404 04/28/22 0423 04/29/22 0416  WBC 11.5* 9.4 8.0 7.8  NEUTROABS 9.4*  --   --  6.0  HGB 15.0 13.8 14.0 13.3  HCT 44.0 41.0 42.3 40.8  MCV 83.0 85.6 86.7 86.6  PLT 178 143* 186 [id number]    Basic Metabolic Panel: Recent Labs  Lab 04/25/22 1223 04/26/22 0404 04/28/22 0423 04/29/22 0602  NA 133* 135 138 129*  K 4.0 3.7 3.4* 3.9  CL 101 101 99 95*  CO2 '23 27 28 26  '$ GLUCOSE 182* 169* 199* 186*  BUN 17 16 24* 20  CREATININE 1.05 1.09 1.02 0.94  CALCIUM 8.8* 8.3* 8.0* 7.7*  MG  --   --  2.2 2.3    GFR: Estimated Creatinine Clearance: 100.7 mL/min (by C-G formula based  on SCr of 0.94 mg/dL).  Liver Function Tests: Recent Labs  Lab 04/25/22 1223 04/28/22 0423 04/29/22 0602  AST '22 15 18  '$ ALT '24 16 15  '$ ALKPHOS 44 40 44  BILITOT 1.9* 2.9* 4.7*  PROT 7.3 6.5 6.5  ALBUMIN 4.3 3.6 3.4*    CBG: Recent Labs  Lab 04/28/22 1617 04/28/22 2316 04/29/22 0721 04/29/22 1135 04/29/22 1642  GLUCAP 275* 162* 172* 195* 185*     Recent Results (from the past 240 hour(s))  Surgical pcr screen     Status: Abnormal   Collection Time: 04/27/22  1:16 AM   Specimen: Nasal Mucosa; Nasal Swab  Result Value Ref Range Status   MRSA, PCR NEGATIVE NEGATIVE Final   Staphylococcus aureus POSITIVE (A) NEGATIVE Final    Comment: (NOTE) The Xpert SA Assay (FDA approved for NASAL specimens in patients  54 years of age and older), is one component of a comprehensive surveillance program. It is not intended to diagnose infection nor to guide or monitor treatment. Performed at [facility], [place] [address] [place], [place] [address]          Radiology Studies: No results found.      Scheduled Meds:  acetaminophen  1,000 mg Oral TID   Chlorhexidine Gluconate Cloth  6 each Topical Q0600   enoxaparin (LOVENOX) injection  40 mg Subcutaneous Q24H   feeding supplement  237 mL Oral TID WC   insulin aspart  0-5 Units Subcutaneous QHS   insulin aspart  0-9 Units Subcutaneous TID WC   insulin glargine-yfgn  8 Units Subcutaneous Daily   lidocaine  1 patch Transdermal Q24H   lidocaine  15 mL Oral Once   metoprolol tartrate  5 mg Intravenous Q8H   mupirocin ointment  1 Application Nasal BID   pantoprazole (PROTONIX) IV  40 mg Intravenous Q24H   simethicone  160 mg Oral QID   Continuous Infusions:  0.9 % NaCl with KCl 40 mEq / L 100 mL/hr at 04/29/22 1051   famotidine (PEPCID) IV 20 mg (04/29/22 1233)   methocarbamol (ROBAXIN) IV 500 mg (04/29/22 1416)     LOS: 4 days    Time spent: 40 minutes    Irine Seal, MD [facility]   To contact the attending provider between 7A-7P or the covering provider during after hours 7P-7A, please log into the web site [email or web address] and access using universal [place] password for that web site. If you do not have the password, please call the hospital operator.  04/29/2022, 5:44 PM

## 2022-04-29 NOTE — Evaluation (Signed)
Physical Therapy Evaluation Patient Details Name: Noah Lane MRN: [id number] DOB: 28-Feb-1956 Today's Date: 04/29/2022  History of Present Illness  Pt is a 67 yo male s/p Laparoscopic-assisted right hemicolectomy with ileocolic reanastomosis [id number] Dr. [name]. PMH: diabetes  Clinical Impression  Pt admitted with above diagnosis. Pt from home, ind at baseline without AD, bedrooms on 2nd level, spouse home but has mobility issues so pt completes household chores at baseline. Pt declines to mobility with therapist, does demonstrate ability to move BLE freely in bed and reposition to comfort as needed, denies numbness and tingling in BLE. Pt reports amb 910 ft with mobility specialist earlier today without pain, but reports nervous about stairs and agreeable to practice with therapy prior to d/c home. Anticipate no f/u or DME needs at d/c. Plan to complete stair training in preparation for pt to return to 2nd level for sleeping at home. Pt currently with functional limitations due to the deficits listed below (see PT Problem List). Pt will benefit from skilled PT to increase their independence and safety with mobility to allow discharge to the venue listed below.          Recommendations for follow up therapy are one component of a multi-disciplinary discharge planning process, led by the attending physician.  Recommendations may be updated based on patient status, additional functional criteria and insurance authorization.  Follow Up Recommendations No PT follow up      Assistance Recommended at Discharge PRN  Patient can return home with the following  Assistance with cooking/housework    Equipment Recommendations None recommended by PT  Recommendations for Other Services       Functional Status Assessment Patient has had a recent decline in their functional status and demonstrates the ability to make significant improvements in function in a reasonable and predictable amount of time.      Precautions / Restrictions Precautions Precautions: Fall Precaution Comments: abdominal surgery Restrictions Weight Bearing Restrictions: No      Mobility  Bed Mobility  General bed mobility comments: pt supine in bed, declines getting OOB, freely moving BLE in the bed    Transfers   Ambulation/Gait   Stairs   Wheelchair Mobility    Modified Rankin (Stroke Patients Only)       Balance       Pertinent Vitals/Pain Pain Assessment Pain Assessment: Faces Faces Pain Scale: Hurts a little bit Pain Location: abdomen Pain Descriptors / Indicators: Discomfort Pain Intervention(s): Limited activity within patient's tolerance, Monitored during session    Home Living Family/patient expects to be discharged to:: Private residence Living Arrangements: Spouse/significant other Available Help at Discharge: Family;Available PRN/intermittently Type of Home: House (townhome) Home Access: Stairs to enter Entrance Stairs-Rails: None Entrance Stairs-Number of Steps: 3 Alternate Level Stairs-Number of Steps: flight Home Layout: Two level Home Equipment: Cane - single point      Prior Function Prior Level of Function : Independent/Modified Independent;Working/employed;Driving  Mobility Comments: pt reports ind, no AD ADLs Comments: pt reports ind with ADLs/IADLS     Hand Dominance        Extremity/Trunk Assessment   Upper Extremity Assessment Upper Extremity Assessment: Overall WFL for tasks assessed    Lower Extremity Assessment Lower Extremity Assessment: Overall WFL for tasks assessed (AROM WFL, freely moving BLE in bed, denies numbness/tingling)       Communication   Communication: No difficulties  Cognition Arousal/Alertness: Awake/alert Behavior During Therapy: WFL for tasks assessed/performed Overall Cognitive Status: Within Functional Limits for tasks assessed  General Comments      Exercises     Assessment/Plan    PT Assessment Patient needs  continued PT services  PT Problem List Decreased activity tolerance;Pain       PT Treatment Interventions DME instruction;Gait training;Stair training;Functional mobility training;Therapeutic activities;Therapeutic exercise;Balance training;Neuromuscular re-education;Patient/family education    PT Goals (Current goals can be found in the Care Plan section)  Acute Rehab PT Goals Patient Stated Goal: practice stairs PT Goal Formulation: With patient Time For Goal Achievement: 05/13/22 Potential to Achieve Goals: Good    Frequency Min 3X/week     Co-evaluation               AM-PAC PT "6 Clicks" Mobility  Outcome Measure Help needed turning from your back to your side while in a flat bed without using bedrails?: A Little Help needed moving from lying on your back to sitting on the side of a flat bed without using bedrails?: A Little Help needed moving to and from a bed to a chair (including a wheelchair)?: A Little Help needed standing up from a chair using your arms (e.g., wheelchair or bedside chair)?: A Little Help needed to walk in hospital room?: A Little Help needed climbing 3-5 steps with a railing? : A Little 6 Click Score: 18    End of Session   Activity Tolerance: Patient tolerated treatment well Patient left: in bed;with call bell/phone within reach Nurse Communication: Mobility status PT Visit Diagnosis: Other abnormalities of gait and mobility (R26.89);Pain Pain - part of body:  (abdomen)    Time: [id number] PT Time Calculation (min) (ACUTE ONLY): 11 min   Charges:   PT Evaluation $PT Eval Low Complexity: 1 Low           Tori Rowan Blaker PT, DPT 04/29/22, 11:54 AM

## 2022-04-29 NOTE — Progress Notes (Signed)
Mobility Specialist - Progress Note   04/29/22 1613  Mobility  Activity Ambulated with assistance in hallway  Level of Assistance Modified independent, requires aide device or extra time  Assistive Device Front wheel walker  Distance Ambulated (ft) 950 ft  Activity Response Tolerated well  Mobility Referral Yes  $Mobility charge 1 Mobility   Pt received in bed and agreeable to mobility. No complaints during session. Pt to bed after session with all needs met.    [facility]

## 2022-04-30 ENCOUNTER — Inpatient Hospital Stay (HOSPITAL_COMMUNITY): Payer: Medicare Other

## 2022-04-30 DIAGNOSIS — G4733 Obstructive sleep apnea (adult) (pediatric): Secondary | ICD-10-CM | POA: Diagnosis not present

## 2022-04-30 DIAGNOSIS — E871 Hypo-osmolality and hyponatremia: Secondary | ICD-10-CM | POA: Diagnosis not present

## 2022-04-30 DIAGNOSIS — K56609 Unspecified intestinal obstruction, unspecified as to partial versus complete obstruction: Secondary | ICD-10-CM | POA: Diagnosis not present

## 2022-04-30 DIAGNOSIS — E1169 Type 2 diabetes mellitus with other specified complication: Secondary | ICD-10-CM | POA: Diagnosis not present

## 2022-04-30 LAB — RENAL FUNCTION PANEL
Albumin: 2.9 g/dL — ABNORMAL LOW (ref 3.5–5.0)
Anion gap: 8 (ref 5–15)
BUN: 19 mg/dL (ref 8–23)
CO2: 22 mmol/L (ref 22–32)
Calcium: 7.9 mg/dL — ABNORMAL LOW (ref 8.9–10.3)
Chloride: 101 mmol/L (ref 98–111)
Creatinine, Ser: 0.98 mg/dL (ref 0.61–1.24)
GFR, Estimated: >60 mL/min (ref >60)
Glucose, Bld: 184 mg/dL — ABNORMAL HIGH (ref 70–99)
Phosphorus: 1.8 mg/dL — ABNORMAL LOW (ref 2.5–4.6)
Potassium: 4.6 mmol/L (ref 3.5–5.1)
Sodium: 131 mmol/L — ABNORMAL LOW (ref 135–145)

## 2022-04-30 LAB — CBC WITH DIFFERENTIAL/PLATELET
Abs Immature Granulocytes: 0.06 10*3/uL (ref 0.00–0.07)
Basophils Absolute: 0 10*3/uL (ref 0.0–0.1)
Basophils Relative: 1 %
Eosinophils Absolute: 0.2 10*3/uL (ref 0.0–0.5)
Eosinophils Relative: 3 %
HCT: 41.5 % (ref 39.0–52.0)
Hemoglobin: 13.5 g/dL (ref 13.0–17.0)
Immature Granulocytes: 1 %
Lymphocytes Relative: 10 %
Lymphs Abs: 0.8 10*3/uL (ref 0.7–4.0)
MCH: 28 pg (ref 26.0–34.0)
MCHC: 32.5 g/dL (ref 30.0–36.0)
MCV: 86.1 fL (ref 80.0–100.0)
Monocytes Absolute: 0.9 10*3/uL (ref 0.1–1.0)
Monocytes Relative: 11 %
Neutro Abs: 5.9 10*3/uL (ref 1.7–7.7)
Neutrophils Relative %: 74 %
Platelets: 212 10*3/uL (ref 150–400)
RBC: 4.82 MIL/uL (ref 4.22–5.81)
RDW: 13.1 % (ref 11.5–15.5)
WBC: 7.9 10*3/uL (ref 4.0–10.5)
nRBC: 0 % (ref 0.0–0.2)

## 2022-04-30 LAB — GLUCOSE, CAPILLARY
Glucose-Capillary: 195 mg/dL — ABNORMAL HIGH (ref 70–99)
Glucose-Capillary: 189 mg/dL — ABNORMAL HIGH (ref 70–99)
Glucose-Capillary: 171 mg/dL — ABNORMAL HIGH (ref 70–99)
Glucose-Capillary: 153 mg/dL — ABNORMAL HIGH (ref 70–99)

## 2022-04-30 LAB — MAGNESIUM: Magnesium: 2.1 mg/dL (ref 1.7–2.4)

## 2022-04-30 MED ORDER — SODIUM CHLORIDE 0.9 % IV SOLN: INTRAVENOUS | Status: DC

## 2022-04-30 MED ORDER — SODIUM PHOSPHATES 45 MMOLE/15ML IV SOLN
30.0000 mmol | Freq: Once | INTRAVENOUS | Status: AC
  Administered 2022-04-30: 30 mmol via INTRAVENOUS
  Filled 2022-04-30: qty 10

## 2022-04-30 MED ORDER — METOPROLOL TARTRATE 5 MG/5ML IV SOLN
5.0000 mg | Freq: Four times a day (QID) | INTRAVENOUS | Status: DC
  Administered 2022-04-30 – 2022-05-03 (×13): 5 mg via INTRAVENOUS
  Filled 2022-04-30 (×13): qty 5

## 2022-04-30 MED ORDER — LIDOCAINE VISCOUS HCL 2 % MT SOLN: 15.0000 mL | Freq: Four times a day (QID) | OROMUCOSAL | Status: DC | PRN

## 2022-04-30 MED ORDER — ALUM & MAG HYDROXIDE-SIMETH 200-200-20 MG/5ML PO SUSP: 30.0000 mL | Freq: Four times a day (QID) | ORAL | Status: DC | PRN

## 2022-04-30 MED ORDER — INSULIN GLARGINE-YFGN 100 UNIT/ML ~~LOC~~ SOLN
10.0000 [IU] | Freq: Every day | SUBCUTANEOUS | Status: DC
  Administered 2022-04-30 – 2022-05-04 (×5): 10 [IU] via SUBCUTANEOUS
  Filled 2022-04-30 (×5): qty 0.1

## 2022-04-30 NOTE — Progress Notes (Signed)
PROGRESS NOTE    Noah Lane  [id number] DOB: 1955-09-12 DOA: 04/25/2022 PCP: [name], DO    Chief Complaint  Patient presents with   Abdominal Pain    Brief Narrative:  Patient pleasant 67 year old gentleman history of type 2 diabetes, hyperlipidemia, OSA on CPAP, obesity presented with abdominal pain and distention, right lower quadrant, no bowel movements on the day of admission. Patient underwent CT scan done concerning for near obstructing mass at the hepatic flexure worrisome for colon cancer.  Loss of stool proximal to this area.  No evidence of perforation.  Patient admitted.  General surgery consulted who recommended GI input.  Patient assessed by GI who reviewed films, patient noted to have a high-grade colonic obstruction of the hepatic flexure concerning for malignancy with ascending colon dilatation, not a candidate for bowel prep due to high-grade obstruction with proximal colon dilation, no role for colonoscopy of stent and recommended surgical management. Patient subsequently underwent laparoscopic assisted right hemicolectomy with ileocolic reanastomosis by general surgery on 04/27/2022.   Assessment & Plan:   Principal Problem:   Colonic obstruction (HCC) Active Problems:   Diabetes mellitus type 2 in obese (HCC)   OSA on CPAP   Obesity (BMI 30.0-34.9)   Hyponatremia   Hypokalemia   Hyperlipidemia   Gastroesophageal reflux disease   Ileus, postoperative (HCC)  #1 colonic obstruction at level of hepatic flexure suspicious for malignancy (colonic adenocarcinoma) -Patient had presented with right lower quadrant abdominal pain, no bowel movement, seen in the ED CT abdomen and pelvis done concerning for near obstructing mass at the hepatic flexure concerning for colon cancer. -Patient admitted, seen in consultation by general surgery who recommended evaluation by GI prior to any surgical input. -Patient seen in consultation by GI who reviewed  films and felt patient had a high-grade colonic obstruction at hepatic flexure concerning for malignancy and the ascending colonic dilatation, not a candidate for bowel prep due to high-grade obstruction with proximal colon dilatation, no role for colonoscopy or stenting regimen commended surgical management. -Patient subsequently underwent laparoscopic assisted right hemicolectomy with ileocolic reanastomosis per Dr. [name] 04/27/2022.  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 -General surgery ordering a staging chest CT and baseline CEA and referral placed to medical oncology at discharge per general surgery. -Patient followed by general surgery, NG tube removed, patient placed on clear liquids. -Patient with no bowel movement, no flatus, this morning however later on in the morning patient had 2 loose bowel movements.   -Currently on clear liquid diet per general surgery.  -Keep potassium approximately 4, magnesium approximately 2. -Mobilize. -Per general surgery.   -#2 probable postop ileus -Patient with complaints of reflux symptoms, abdominal bloating, feels like trapped gas.  No bowel movement, no flatus. -Keep potassium approximately 4, magnesium approximately 2. -Patient being followed by general surgery diet decreased to clear liquids.   -Patient noted to have had a bowel movement early on this morning.   -KUB ordered.  -Per general surgery. -Mobilize.  3.  Diabetes mellitus type 2 -Hemoglobin A1c 8.8 (04/22/2022) -CBG 195 this morning. -Continue to hold oral hypoglycemic agents.   -Increase Semglee to 10 units daily.   -SSI.   4.  Hyperlipidemia -Continue to hold statin. -May resume on discharge.  5.  GERD -GI cocktail x 1. -Continue IV PPI until bowel function returns.   -Place on GI cocktail as needed.    6.  OSA -Noted to be on CPAP at home.  7.  Hypokalemia/hypophosphatemia -Status post IV KCl. -Potassium at 4.6 this morning.   Magnesium at 2.1.   -Phosphorus at 1.8. -Replete phosphorus. -Follow.  8.  Hyponatremia -Sodium currently at 131. -IV fluids changed to normal saline which we will continue.   -Remove potassium from IV fluids.  -Repeat labs in the AM.      DVT prophylaxis: Lovenox Code Status: Full Family Communication: Updated patient.  Updated wife at bedside.   Disposition: Home when clinically improved, tolerating oral intake, cleared by general surgery.  Status is: Inpatient Remains inpatient appropriate because: Severity of illness   Consultants:  General surgery: [name] 04/25/2022 General surgery: [name] Plan 04/26/2022 Wound care RN: [name] 04/26/2022  Procedures:  CT abdomen and pelvis 04/25/2022 Abdominal films 04/25/2022 Laparoscopic assisted right hemicolectomy with ileocolic reanastomosis per [name] 04/27/2022  Antimicrobials:  Anti-infectives (From admission, onward)    Start     Dose/Rate Route Frequency Ordered Stop   04/27/22 0645  cefoTEtan (CEFOTAN) 2 g in sodium chloride 0.9 % 100 mL IVPB        2 g 200 mL/hr over 30 Minutes Intravenous On call to O.R. 04/27/22 ED:[id number] 04/27/22 0802         Subjective: Laying in bed.  Feels reflux has improved.  No chest pain.  No shortness of breath.  Stated had 2 loose bowel movements over the past 2 hours.  Still with some nausea.  No emesis.  Wife at bedside.  Patient states he feels he has been run over by a truck after receiving the bad news that biopsy was positive for colon cancer.  Objective: Vitals:   04/29/22 1152 04/29/22 2058 04/30/22 0503 04/30/22 1142  BP: (!) 148/84 (!) 161/80 (!) 174/85 (!) 172/80  Pulse: 75 74 67 85  Resp: '17 18 18 17  '$ Temp: 99.3 F (37.4 C) 98.7 F (37.1 C) 98.5 F (36.9 C) 98.4 F (36.9 C)  TempSrc: Oral Oral Oral   SpO2: 98% 98% 97% 96%  Weight:      Height:        Intake/Output Summary (Last 24 hours) at 04/30/2022 1234 Last data filed at 04/30/2022 1201 Gross per 24 hour   Intake 1931.9 ml  Output 1300 ml  Net 631.9 ml    Filed Weights   04/25/22 1145 04/27/22 0656  Weight: 117 kg 117 kg    Examination:  General exam: NAD. Respiratory system: Lungs clear to auscultation bilaterally.  No wheezes, no crackles, no rhonchi.  Fair air movement.  Speaking in full sentences.   Cardiovascular system: RRR no murmurs rubs or gallops.  No JVD.  No lower extremity edema.  Gastrointestinal system: Abdomen mildly distended, soft, some diffuse tenderness to palpation, no rebound, no guarding.  Hypoactive bowel sounds.  Central nervous system: Alert and oriented.  Moving extremities spontaneously.  No focal neurological deficits. Extremities: Symmetric 5 x 5 power. Skin: No rashes, lesions or ulcers Psychiatry: Judgement and insight appear normal. Mood & affect appropriate.     Data Reviewed: I have personally reviewed following labs and imaging studies  CBC: Recent Labs  Lab 04/25/22 1223 04/26/22 0404 04/28/22 0423 04/29/22 0416 04/30/22 0513  WBC 11.5* 9.4 8.0 7.8 7.9  NEUTROABS 9.4*  --   --  6.0 5.9  HGB 15.0 13.8 14.0 13.3 13.5  HCT 44.0 41.0 42.3 40.8 41.5  MCV 83.0 85.6 86.7 86.6 86.1  PLT 178 143* 186 169 212     Basic Metabolic Panel: Recent Labs  Lab  04/25/22 1223 04/26/22 0404 04/28/22 0423 04/29/22 0602 04/30/22 0513  NA 133* 135 138 129* 131*  K 4.0 3.7 3.4* 3.9 4.6  CL 101 101 99 95* 101  CO2 '23 27 28 26 22  '$ GLUCOSE 182* 169* 199* 186* 184*  BUN 17 16 24* 20 19  CREATININE 1.05 1.09 1.02 0.94 0.98  CALCIUM 8.8* 8.3* 8.0* 7.7* 7.9*  MG  --   --  2.2 2.3 2.1  PHOS  --   --   --   --  1.8*     GFR: Estimated Creatinine Clearance: 96.6 mL/min (by C-G formula based on SCr of 0.98 mg/dL).  Liver Function Tests: Recent Labs  Lab 04/25/22 1223 04/28/22 0423 04/29/22 0602 04/30/22 0513  AST '22 15 18  '$ --   ALT '24 16 15  '$ --   ALKPHOS 44 40 44  --   BILITOT 1.9* 2.9* 4.7*  --   PROT 7.3 6.5 6.5  --   ALBUMIN 4.3 3.6  3.4* 2.9*     CBG: Recent Labs  Lab 04/29/22 1135 04/29/22 1642 04/29/22 2056 04/30/22 0718 04/30/22 1140  GLUCAP 195* 185* 189* 195* 189*      Recent Results (from the past 240 hour(s))  Surgical pcr screen     Status: Abnormal   Collection Time: 04/27/22  1:16 AM   Specimen: Nasal Mucosa; Nasal Swab  Result Value Ref Range Status   MRSA, PCR NEGATIVE NEGATIVE Final   Staphylococcus aureus POSITIVE (A) NEGATIVE Final    Comment: (NOTE) The Xpert SA Assay (FDA approved for NASAL specimens in patients 41 years of age and older), is one component of a comprehensive surveillance program. It is not intended to diagnose infection nor to guide or monitor treatment. Performed at [facility], [place] [address] [place], [place] [address]          Radiology Studies: DG Abd Portable 1V  Result Date: 04/30/2022 CLINICAL DATA:  Abdominal distension EXAM: PORTABLE ABDOMEN - 1 VIEW COMPARISON:  04/25/2022 FINDINGS: Interval removal of NG tube. Several dilated loops of small bowel in the mid abdomen measuring up to 5 cm. There is gas in the rectum. IMPRESSION: Dilated small bowel with gas the rectum. Differential includes early small bowel obstruction versus ileus. Electronically Signed   By: Suzy Bouchard M.D.   On: 04/30/2022 08:50        Scheduled Meds:  acetaminophen  1,000 mg Oral TID   Chlorhexidine Gluconate Cloth  6 each Topical Q0600   enoxaparin (LOVENOX) injection  40 mg Subcutaneous Q24H   feeding supplement  237 mL Oral TID WC   insulin aspart  0-5 Units Subcutaneous QHS   insulin aspart  0-9 Units Subcutaneous TID WC   insulin glargine-yfgn  10 Units Subcutaneous Daily   lidocaine  1 patch Transdermal Q24H   lidocaine  15 mL Oral Once   metoprolol tartrate  5 mg Intravenous Q6H   mupirocin ointment  1 Application Nasal BID   pantoprazole (PROTONIX) IV  40 mg Intravenous Q24H   simethicone  160 mg Oral QID   Continuous Infusions:  0.9 %  NaCl with KCl 40 mEq / L 50 mL/hr (04/30/22 1158)   famotidine (PEPCID) IV 20 mg (04/30/22 0935)   methocarbamol (ROBAXIN) IV 500 mg (04/30/22 0506)   sodium phosphate 30 mmol in dextrose 5 % 250 mL infusion 30 mmol (04/30/22 0935)     LOS: 5 days    Time spent: 40 minutes  Irine Seal, MD [facility]   To contact the attending provider between 7A-7P or the covering provider during after hours 7P-7A, please log into the web site [email or web address] and access using universal  password for that web site. If you do not have the password, please call the hospital operator.  04/30/2022, 12:34 PM

## 2022-04-30 NOTE — Discharge Instructions (Signed)
[facility]      [facility] [phone]  OPEN ABDOMINAL SURGERY: POST OP INSTRUCTIONS  Always review your discharge instruction sheet given to you by the facility where your surgery was performed.  IF YOU HAVE DISABILITY OR FAMILY LEAVE FORMS, YOU MUST BRING THEM TO THE OFFICE FOR PROCESSING.  PLEASE DO NOT GIVE THEM TO YOUR DOCTOR.  A prescription for pain medication may be given to you upon discharge.  Take your pain medication as prescribed, if needed.  If narcotic pain medicine is not needed, then you may take acetaminophen (Tylenol) or ibuprofen (Advil) as needed. Take your usually prescribed medications unless otherwise directed. If you need a refill on your pain medication, please contact your pharmacy. They will contact our office to request authorization.  Prescriptions will not be filled after 5pm or on week-ends. You should follow a light diet the first few days after arrival home, such as soup and crackers, pudding, etc.unless your doctor has advised otherwise. A high-fiber, low fat diet can be resumed as tolerated.   Be sure to include lots of fluids daily. Most patients will experience some swelling and bruising on the chest and neck area.  Ice packs will help.  Swelling and bruising can take several days to resolve Most patients will experience some swelling and bruising in the area of the incision. Ice pack will help. Swelling and bruising can take several days to resolve..  It is common to experience some constipation if taking pain medication after surgery.  Increasing fluid intake and taking a stool softener will usually help or prevent this problem from occurring.  A mild laxative (Milk of Magnesia or Miralax) should be taken according to package directions if there are no bowel movements after 48 hours.  You may have steri-strips (small skin tapes) in place directly over the incision.  These strips should be left on the skin for 7-10 days.  If your surgeon used skin  glue on the incision, you may shower in 24 hours.  The glue will flake off over the next 2-3 weeks.  Any sutures or staples will be removed at the office during your follow-up visit. You may find that a light gauze bandage over your incision may keep your staples from being rubbed or pulled. You may shower and replace the bandage daily. ACTIVITIES:  You may resume regular (light) daily activities beginning the next day--such as daily self-care, walking, climbing stairs--gradually increasing activities as tolerated.  You may have sexual intercourse when it is comfortable.  Refrain from any heavy lifting or straining until approved by your doctor. You may drive when you no longer are taking prescription pain medication, you can comfortably wear a seatbelt, and you can safely maneuver your car and apply brakes Return to Work: ___________________________________ You should see your doctor in the office for a follow-up appointment approximately two weeks after your surgery.  Make sure that you call for this appointment within a day or two after you arrive home to insure a convenient appointment time. OTHER INSTRUCTIONS:  _____________________________________________________________ _____________________________________________________________  WHEN TO CALL YOUR DOCTOR: Fever over 101.0 Inability to urinate Nausea and/or vomiting Extreme swelling or bruising Continued bleeding from incision. Increased pain, redness, or drainage from the incision. Difficulty swallowing or breathing Muscle cramping or spasms. Numbness or tingling in hands or feet or around lips.  The clinic staff is available to answer your questions during regular business hours.  Please don't hesitate to call and ask to speak to one of   the nurses if you have concerns.  For further questions, please visit [email or web address]  

## 2022-04-30 NOTE — Progress Notes (Signed)
PT Cancellation Note  Patient Details Name: Noah Lane MRN: [id number] DOB: November 11, 1955   Cancelled Treatment:    Reason Eval/Treat Not Completed: Fatigue/lethargy limiting ability to participate (pt stated he doesn't feel up to walking because he's "dealing with the diagnosis and is very tired". Encouraged pt to mobilize later today. Will follow.)   Philomena Doheny PT 04/30/2022  Acute Rehabilitation Services  Office [phone]

## 2022-04-30 NOTE — Plan of Care (Signed)

## 2022-04-30 NOTE — Progress Notes (Signed)
[facility] Progress Note  3 Days Post-Op  Subjective: CC:  Cc is still abdominal distention/gas. Reports some nausea with laying flat, improves with sitting up. Denies emesis. Denies flatus/BM. Mobilizing with walker.   Objective: Vital signs in last 24 hours: Temp:  [98.5 F (36.9 C)-99.3 F (37.4 C)] 98.5 F (36.9 C) (03/01 0503) Pulse Rate:  [67-75] 67 (03/01 0503) Resp:  [17-18] 18 (03/01 0503) BP: (148-174)/(80-85) 174/85 (03/01 0503) SpO2:  [97 %-98 %] 97 % (03/01 0503) FiO2 (%):  [21 %] 21 % (02/29 2247) Last BM Date : 04/25/22  Intake/Output from previous day: 02/29 0701 - 03/01 0700 In: 2021.9 [P.O.:720; I.V.:1058.7; IV Piggyback:243.2] Out: 1450 [Urine:1450] Intake/Output this shift: Total I/O In: -  Out: 200 [Urine:200]  PE: Gen:  Alert, NAD, cooperative  Card:  Regular rate and rhythm Pulm:  Normal effort non-labored on ORA  Abd: Soft, appropriately tender, moderate distention possibly increased compared to yesterday, incisions c/d/i Skin: warm and dry, no rashes  Psych: A&Ox3   Lab Results:  Recent Labs    04/29/22 0416 04/30/22 0513  WBC 7.8 7.9  HGB 13.3 13.5  HCT 40.8 41.5  PLT 169 212   BMET Recent Labs    04/29/22 0602 04/30/22 0513  NA 129* 131*  K 3.9 4.6  CL 95* 101  CO2 26 22  GLUCOSE 186* 184*  BUN 20 19  CREATININE 0.94 0.98  CALCIUM 7.7* 7.9*   PT/INR No results for input(s): "LABPROT", "INR" in the last 72 hours. CMP     Component Value Date/Time   NA 131 (L) 04/30/2022 0513   K 4.6 04/30/2022 0513   CL 101 04/30/2022 0513   CO2 22 04/30/2022 0513   GLUCOSE 184 (H) 04/30/2022 0513   BUN 19 04/30/2022 0513   CREATININE 0.98 04/30/2022 0513   CREATININE 1.11 05/23/2020 1628   CALCIUM 7.9 (L) 04/30/2022 0513   PROT 6.5 04/29/2022 0602   ALBUMIN 2.9 (L) 04/30/2022 0513   AST 18 04/29/2022 0602   ALT 15 04/29/2022 0602   ALKPHOS 44 04/29/2022 0602   BILITOT 4.7 (H) 04/29/2022 0602   GFRNONAA >60  04/30/2022 0513   Lipase     Component Value Date/Time   LIPASE 30 04/25/2022 1223   Anti-infectives: Anti-infectives (From admission, onward)    Start     Dose/Rate Route Frequency Ordered Stop   04/27/22 0645  cefoTEtan (CEFOTAN) 2 g in sodium chloride 0.9 % 100 mL IVPB        2 g 200 mL/hr over 30 Minutes Intravenous On call to O.R. 04/27/22 0644 04/27/22 0802        Assessment/Plan Hepatic flexure mass causing LBO   POD#3 s/p Laparoscopic-assisted right hemicolectomy with ileocolic reanastomosis [id number] Dr. [name]  - AFVSS, WBC 7.9, hgb stable - clinically he has an ileus. Allow sips of clears, check KUB. Discussed the possibility of NG decompression with the patient.  - PT eval - no PT follow up - await surgical path  FEN: CLD, hypokalemia resolved  ID: perioperative cefotetan 2/27 VTE: SCD's,  lovenox Foley: removed 2/28, spont voids Dispo: med surg, await bowel function    OSA on CPAP DM2, uncontrolled, A1c 8.8%  HTN    LOS: 5 days   I reviewed nursing notes, hospitalist notes, last 24 h vitals and pain scores, last 48 h intake and output, last 24 h labs and trends, and last 24 h imaging results.    Obie Dredge, PA-C [facility]  Please see Amion for pager number during day hours 7:00am-4:30pm

## 2022-05-01 DIAGNOSIS — E1169 Type 2 diabetes mellitus with other specified complication: Secondary | ICD-10-CM | POA: Diagnosis not present

## 2022-05-01 DIAGNOSIS — K56609 Unspecified intestinal obstruction, unspecified as to partial versus complete obstruction: Secondary | ICD-10-CM | POA: Diagnosis not present

## 2022-05-01 DIAGNOSIS — E871 Hypo-osmolality and hyponatremia: Secondary | ICD-10-CM | POA: Diagnosis not present

## 2022-05-01 DIAGNOSIS — G4733 Obstructive sleep apnea (adult) (pediatric): Secondary | ICD-10-CM | POA: Diagnosis not present

## 2022-05-01 LAB — MAGNESIUM: Magnesium: 2.1 mg/dL (ref 1.7–2.4)

## 2022-05-01 LAB — COMPREHENSIVE METABOLIC PANEL
ALT: 15 U/L (ref 0–44)
AST: 14 U/L — ABNORMAL LOW (ref 15–41)
Albumin: 2.6 g/dL — ABNORMAL LOW (ref 3.5–5.0)
Alkaline Phosphatase: 49 U/L (ref 38–126)
Anion gap: 7 (ref 5–15)
BUN: 20 mg/dL (ref 8–23)
CO2: 23 mmol/L (ref 22–32)
Calcium: 8 mg/dL — ABNORMAL LOW (ref 8.9–10.3)
Chloride: 104 mmol/L (ref 98–111)
Creatinine, Ser: 1.05 mg/dL (ref 0.61–1.24)
GFR, Estimated: >60 mL/min (ref >60)
Glucose, Bld: 165 mg/dL — ABNORMAL HIGH (ref 70–99)
Potassium: 4.4 mmol/L (ref 3.5–5.1)
Sodium: 134 mmol/L — ABNORMAL LOW (ref 135–145)
Total Bilirubin: 2 mg/dL — ABNORMAL HIGH (ref 0.3–1.2)
Total Protein: 5.8 g/dL — ABNORMAL LOW (ref 6.5–8.1)

## 2022-05-01 LAB — GLUCOSE, CAPILLARY
Glucose-Capillary: 159 mg/dL — ABNORMAL HIGH (ref 70–99)
Glucose-Capillary: 166 mg/dL — ABNORMAL HIGH (ref 70–99)
Glucose-Capillary: 167 mg/dL — ABNORMAL HIGH (ref 70–99)
Glucose-Capillary: 177 mg/dL — ABNORMAL HIGH (ref 70–99)

## 2022-05-01 LAB — CBC
HCT: 38.5 % — ABNORMAL LOW (ref 39.0–52.0)
Hemoglobin: 12.3 g/dL — ABNORMAL LOW (ref 13.0–17.0)
MCH: 28.1 pg (ref 26.0–34.0)
MCHC: 31.9 g/dL (ref 30.0–36.0)
MCV: 88.1 fL (ref 80.0–100.0)
Platelets: 210 10*3/uL (ref 150–400)
RBC: 4.37 MIL/uL (ref 4.22–5.81)
RDW: 13.2 % (ref 11.5–15.5)
WBC: 5.7 10*3/uL (ref 4.0–10.5)
nRBC: 0 % (ref 0.0–0.2)

## 2022-05-01 LAB — PHOSPHORUS: Phosphorus: 2.8 mg/dL (ref 2.5–4.6)

## 2022-05-01 LAB — CEA: CEA: 0.9 ng/mL (ref 0.0–4.7)

## 2022-05-01 MED ORDER — PROCHLORPERAZINE EDISYLATE 10 MG/2ML IJ SOLN: 5.0000 mg | INTRAMUSCULAR | Status: DC | PRN

## 2022-05-01 MED ORDER — SODIUM CHLORIDE 0.9% FLUSH
3.0000 mL | Freq: Two times a day (BID) | INTRAVENOUS | Status: DC
  Administered 2022-05-01 – 2022-05-04 (×6): 3 mL via INTRAVENOUS

## 2022-05-01 MED ORDER — LIP MEDEX EX OINT
TOPICAL_OINTMENT | Freq: Two times a day (BID) | CUTANEOUS | Status: DC
  Administered 2022-05-01: 75 via TOPICAL
  Filled 2022-05-01 (×2): qty 7

## 2022-05-01 MED ORDER — LACTATED RINGERS IV BOLUS: 1000.0000 mL | Freq: Three times a day (TID) | INTRAVENOUS | Status: AC | PRN

## 2022-05-01 MED ORDER — SODIUM CHLORIDE 0.9% FLUSH: 3.0000 mL | INTRAVENOUS | Status: DC | PRN

## 2022-05-01 MED ORDER — BISACODYL 10 MG RE SUPP
10.0000 mg | Freq: Two times a day (BID) | RECTAL | Status: DC | PRN
  Filled 2022-05-01: qty 1

## 2022-05-01 MED ORDER — SODIUM CHLORIDE 0.9 % IV SOLN: 250.0000 mL | INTRAVENOUS | Status: DC | PRN

## 2022-05-01 MED ORDER — MENTHOL 3 MG MT LOZG
1.0000 | LOZENGE | OROMUCOSAL | Status: DC | PRN
  Filled 2022-05-01: qty 9

## 2022-05-01 MED ORDER — CALCIUM POLYCARBOPHIL 625 MG PO TABS
625.0000 mg | ORAL_TABLET | Freq: Two times a day (BID) | ORAL | Status: DC
  Administered 2022-05-01 – 2022-05-04 (×7): 625 mg via ORAL
  Filled 2022-05-01 (×7): qty 1

## 2022-05-01 NOTE — Progress Notes (Signed)
Mobility Specialist - Progress Note   05/01/22 1236  Mobility  Activity Ambulated with assistance in hallway  Level of Assistance Modified independent, requires aide device or extra time  Assistive Device Front wheel walker  Distance Ambulated (ft) 1050 ft  Range of Motion/Exercises Active  Activity Response Tolerated well  $Mobility charge 1 Mobility   Pt was found in bed wanting to ambulate. Able to practice going up and down stairs this session as well. At EOS returned to recliner chair with necessities and wife in room.  Ferd Hibbs Mobility Specialist

## 2022-05-01 NOTE — Progress Notes (Addendum)
PROGRESS NOTE    Noah Lane  [id number] DOB: 1955/11/22 DOA: 04/25/2022 PCP: [name], DO    Chief Complaint  Patient presents with   Abdominal Pain    Brief Narrative:  Patient pleasant 67 year old gentleman history of type 2 diabetes, hyperlipidemia, OSA on CPAP, obesity presented with abdominal pain and distention, right lower quadrant, no bowel movements on the day of admission. Patient underwent CT scan done concerning for near obstructing mass at the hepatic flexure worrisome for colon cancer.  Loss of stool proximal to this area.  No evidence of perforation.  Patient admitted.  General surgery consulted who recommended GI input.  Patient assessed by GI who reviewed films, patient noted to have a high-grade colonic obstruction of the hepatic flexure concerning for malignancy with ascending colon dilatation, not a candidate for bowel prep due to high-grade obstruction with proximal colon dilation, no role for colonoscopy of stent and recommended surgical management. Patient subsequently underwent laparoscopic assisted right hemicolectomy with ileocolic reanastomosis by general surgery on 04/27/2022.   Assessment & Plan:   Principal Problem:   Colonic obstruction (HCC) Active Problems:   Diabetes mellitus type 2 in obese (HCC)   OSA on CPAP   Obesity (BMI 30.0-34.9)   Hyponatremia   Hypokalemia   Hyperlipidemia   Gastroesophageal reflux disease   Ileus, postoperative (HCC)  #1 colonic obstruction at level of hepatic flexure suspicious for malignancy (colonic adenocarcinoma) -Patient had presented with right lower quadrant abdominal pain, no bowel movement, seen in the ED CT abdomen and pelvis done concerning for near obstructing mass at the hepatic flexure concerning for colon cancer. -Patient admitted, seen in consultation by general surgery who recommended evaluation by GI prior to any surgical input. -Patient seen in consultation by GI who reviewed  films and felt patient had a high-grade colonic obstruction at hepatic flexure concerning for malignancy and the ascending colonic dilatation, not a candidate for bowel prep due to high-grade obstruction with proximal colon dilatation, no role for colonoscopy or stenting regimen commended surgical management. -Patient subsequently underwent laparoscopic assisted right hemicolectomy with ileocolic reanastomosis per Dr. [name] 04/27/2022.  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 -General surgery ordering a staging chest CT(negative for metastatic disease in the chest) and baseline CEA and referral placed to medical oncology at discharge per general surgery. -Patient followed by general surgery, NG tube removed, patient placed on clear liquids which she is tolerating. -Patient now with bowel movements. -Diet advanced to dysphagia 1 diet per general surgery.. -Keep potassium approximately 4, magnesium approximately 2. -Mobilize. -Per general surgery.   -2 probable postop ileus -Patient with complaints of reflux symptoms, abdominal bloating, feels like trapped gas.  No bowel movement, no flatus postoperatively. -Patient with clinical improvement, abdominal bloating improving, reflux symptoms improved, having bowel movements.. -Keep potassium approximately 4, magnesium approximately 2. -Patient advanced to a dysphagia 1 diet per general surgery.   -Mobilize.   -Per general surgery.   3.  Diabetes mellitus type 2 -Hemoglobin A1c 8.8 (04/22/2022) -CBG 159 this morning. -Continue to hold oral hypoglycemic agents.   -Continue Semglee 10 units daily, SSI.    4.  Hyperlipidemia -Continue to hold statin. -May resume on discharge.  5.  GERD -Improved clinically.   -Status post GI cocktail with clinical improvement.   -GI cocktail as needed.  GI cocktail x 1. -Continue IV PPI.    6.  OSA -Noted to be on CPAP at home.  7.  Hypokalemia/hypophosphatemia -Status  post IV  KCl. -Potassium of 4.4, magnesium at 2.1.  Phosphorus at 2.8.   -Follow.  8.  Hyponatremia -Sodium currently at 134. -IV fluids discontinued.   -Repeat labs in the AM.        DVT prophylaxis: Lovenox Code Status: Full Family Communication: Updated patient.  Updated wife at bedside.   Disposition: Home when clinically improved, tolerating oral intake, cleared by general surgery.  Status is: Inpatient Remains inpatient appropriate because: Severity of illness   Consultants:  General surgery: [name] 04/25/2022 General surgery: [name] Plan 04/26/2022 Wound care RN: [name] 04/26/2022  Procedures:  CT abdomen and pelvis 04/25/2022 Abdominal films 04/25/2022 Laparoscopic assisted right hemicolectomy with ileocolic reanastomosis per [name] 04/27/2022  Antimicrobials:  Anti-infectives (From admission, onward)    Start     Dose/Rate Route Frequency Ordered Stop   04/27/22 0645  cefoTEtan (CEFOTAN) 2 g in sodium chloride 0.9 % 100 mL IVPB        2 g 200 mL/hr over 30 Minutes Intravenous On call to O.R. 04/27/22 ED:[id number] 04/27/22 0802         Subjective: Sitting up in bed, wife at bedside.  States had multiple loose stools yesterday, no bowel movement today.  Denies any significant reflux symptoms today.  Abdominal pain slowly improving.  Passing flatus.  No chest pain.  No shortness of breath.  States decreased appetite however he will follow the rules and tries oral intake.  States he has ordered some grits and scrambled eggs this morning.  Wife at bedside.   Objective: Vitals:   04/30/22 1412 04/30/22 1414 04/30/22 2139 05/01/22 0614  BP: (!) 167/92 (!) 167/92 (!) 147/78 (!) 144/83  Pulse: 85 87 77 72  Resp:   17 16  Temp:   98.7 F (37.1 C) 98 F (36.7 C)  TempSrc:   Oral Oral  SpO2:  99% 98% 98%  Weight:      Height:        Intake/Output Summary (Last 24 hours) at 05/01/2022 1154 Last data filed at 05/01/2022 0925 Gross per 24 hour  Intake 3017.96 ml   Output 550 ml  Net 2467.96 ml    Filed Weights   04/25/22 1145 04/27/22 0656  Weight: 117 kg 117 kg    Examination:  General exam: NAD. Respiratory system: CTAB.  No wheezes, no crackles, no rhonchi.  Fair air movement.  Speaking in full sentences.   Cardiovascular system: Regular rate and rhythm no murmurs rubs or gallops.  No JVD.  No lower extremity edema. Gastrointestinal system: Abdomen mildly distended, soft, decreased diffuse tenderness to palpation.  No rebound.  No guarding.  Positive bowel sounds.  Central nervous system: Alert and oriented x 3.  Moving extremities spontaneously.  No focal neurological deficits.   Extremities: Symmetric 5 x 5 power. Skin: No rashes, lesions or ulcers Psychiatry: Judgement and insight appear normal. Mood & affect appropriate.     Data Reviewed: I have personally reviewed following labs and imaging studies  CBC: Recent Labs  Lab 04/25/22 1223 04/26/22 0404 04/28/22 0423 04/29/22 0416 04/30/22 0513 05/01/22 0458  WBC 11.5* 9.4 8.0 7.8 7.9 5.7  NEUTROABS 9.4*  --   --  6.0 5.9  --   HGB 15.0 13.8 14.0 13.3 13.5 12.3*  HCT 44.0 41.0 42.3 40.8 41.5 38.5*  MCV 83.0 85.6 86.7 86.6 86.1 88.1  PLT 178 143* 186 169 212 210     Basic Metabolic Panel: Recent Labs  Lab 04/26/22 0404 04/28/22 0423 04/29/22  0602 04/30/22 0513 05/01/22 0458  NA 135 138 129* 131* 134*  K 3.7 3.4* 3.9 4.6 4.4  CL 101 99 95* 101 104  CO2 '27 28 26 22 23  '$ GLUCOSE 169* 199* 186* 184* 165*  BUN 16 24* '20 19 20  '$ CREATININE 1.09 1.02 0.94 0.98 1.05  CALCIUM 8.3* 8.0* 7.7* 7.9* 8.0*  MG  --  2.2 2.3 2.1 2.1  PHOS  --   --   --  1.8* 2.8     GFR: Estimated Creatinine Clearance: 90.2 mL/min (by C-G formula based on SCr of 1.05 mg/dL).  Liver Function Tests: Recent Labs  Lab 04/25/22 1223 04/28/22 0423 04/29/22 0602 04/30/22 0513 05/01/22 0458  AST '22 15 18  '$ --  14*  ALT '24 16 15  '$ --  15  ALKPHOS 44 40 44  --  49  BILITOT 1.9* 2.9* 4.7*  --   2.0*  PROT 7.3 6.5 6.5  --  5.8*  ALBUMIN 4.3 3.6 3.4* 2.9* 2.6*     CBG: Recent Labs  Lab 04/30/22 0718 04/30/22 1140 04/30/22 1652 04/30/22 2136 05/01/22 0722  GLUCAP 195* 189* 171* 153* 159*      Recent Results (from the past 240 hour(s))  Surgical pcr screen     Status: Abnormal   Collection Time: 04/27/22  1:16 AM   Specimen: Nasal Mucosa; Nasal Swab  Result Value Ref Range Status   MRSA, PCR NEGATIVE NEGATIVE Final   Staphylococcus aureus POSITIVE (A) NEGATIVE Final    Comment: (NOTE) The Xpert SA Assay (FDA approved for NASAL specimens in patients 29 years of age and older), is one component of a comprehensive surveillance program. It is not intended to diagnose infection nor to guide or monitor treatment. Performed at [facility], [place] [address] [place], [place] [address]          Radiology Studies: CT CHEST WO CONTRAST  Result Date: 04/30/2022 CLINICAL DATA:  Colon cancer, for staging EXAM: CT CHEST WITHOUT CONTRAST TECHNIQUE: Multidetector CT imaging of the chest was performed following the standard protocol without IV contrast. RADIATION DOSE REDUCTION: This exam was performed according to the departmental dose-optimization program which includes automated exposure control, adjustment of the mA and/or kV according to patient size and/or use of iterative reconstruction technique. COMPARISON:  CT abdomen/pelvis dated 04/25/2022 month FINDINGS: Cardiovascular: The heart is normal in size. Trace inferior pericardial fluid. No evidence of thoracic aortic aneurysm. Very mild atherosclerotic calcifications of the arch. Moderate three-coronary atherosclerosis. Mediastinum/Nodes: No suspicious mediastinal lymphadenopathy. Visualized thyroid is unremarkable. Lungs/Pleura: Evaluation of the lung parenchyma is constrained by respiratory motion. Within that constraint, there are no suspicious pulmonary nodules. Small bilateral pleural effusions. Mild  scarring/atelectasis in the bilateral lower lobes. No focal consolidation. No pneumothorax. Upper Abdomen: Visualized upper abdomen is notable for mildly dilated loops of small bowel in the central abdomen, likely reflecting adynamic postoperative ileus in the setting of recent surgery. Trace perihepatic ascites. No free air. Musculoskeletal: Degenerative changes of the visualized thoracolumbar spine. IMPRESSION: No evidence of metastatic disease in the chest. Small bilateral pleural effusions. Aortic Atherosclerosis (ICD10-I70.0). Electronically Signed   By: Julian Hy M.D.   On: 04/30/2022 17:09   DG Abd Portable 1V  Result Date: 04/30/2022 CLINICAL DATA:  Abdominal distension EXAM: PORTABLE ABDOMEN - 1 VIEW COMPARISON:  04/25/2022 FINDINGS: Interval removal of NG tube. Several dilated loops of small bowel in the mid abdomen measuring up to 5 cm. There is gas in the rectum. IMPRESSION:  Dilated small bowel with gas the rectum. Differential includes early small bowel obstruction versus ileus. Electronically Signed   By: Suzy Bouchard M.D.   On: 04/30/2022 08:50        Scheduled Meds:  acetaminophen  1,000 mg Oral TID   Chlorhexidine Gluconate Cloth  6 each Topical Q0600   enoxaparin (LOVENOX) injection  40 mg Subcutaneous Q24H   feeding supplement  237 mL Oral TID WC   insulin aspart  0-5 Units Subcutaneous QHS   insulin aspart  0-9 Units Subcutaneous TID WC   insulin glargine-yfgn  10 Units Subcutaneous Daily   lidocaine  1 patch Transdermal Q24H   lidocaine  15 mL Oral Once   lip balm   Topical BID   metoprolol tartrate  5 mg Intravenous Q6H   pantoprazole (PROTONIX) IV  40 mg Intravenous Q24H   polycarbophil  625 mg Oral BID   simethicone  160 mg Oral QID   sodium chloride flush  3 mL Intravenous Q12H   Continuous Infusions:  sodium chloride     famotidine (PEPCID) IV 20 mg (05/01/22 0925)   lactated ringers     methocarbamol (ROBAXIN) IV 500 mg (05/01/22 0622)     LOS:  6 days    Time spent: 40 minutes    Irine Seal, MD [facility]   To contact the attending provider between 7A-7P or the covering provider during after hours 7P-7A, please log into the web site [email or web address] and access using universal [place] password for that web site. If you do not have the password, please call the hospital operator.  05/01/2022, 11:54 AM

## 2022-05-01 NOTE — Progress Notes (Signed)
Noah Lane [id number] Dec 30, 1955  CARE TEAM:  PCP: [name], DO  Outpatient Care Team: Patient Care Team: [name], DO as PCP - General (Family Medicine)  Inpatient Treatment Team: Treatment Team: Attending Provider: Eugenie Filler, MD; Consulting Physician: Edison Pace, Md, MD; Rounding Team: Fatima Blank, MD; Registered Nurse: Alva Garnet, RN; Utilization Review: Claudie Leach, RN   Problem List:   Principal Problem:   Colonic obstruction [facility]) Active Problems:   Diabetes mellitus type 2 in obese ([place])   OSA on CPAP   Obesity (BMI 30.0-34.9)   Hyponatremia   Hypokalemia   Hyperlipidemia   Gastroesophageal reflux disease   Ileus, postoperative ([place])   4 Days Post-Op  04/27/2022  Preoperative Diagnosis: Obstructive right colon mass  Postoperative Diagnosis: Same   Procedure: Laparoscopic-assisted right hemicolectomy with ileocolic reanastomosis   Surgeon: Michaelle Birks, MD  Findings:  Firm mass at the hepatic flexure, highly suspicious for malignancy, with dilation of the ascending colon. Normal caliber of the small bowel. No evidence of carcinomatosis or metastatic disease within the abdomen.   A. 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  [facility] Stay = 6 days)  Assessment/Plan Hepatic flexure mass causing LBO   POD#3  s/p Laparoscopic-assisted right hemicolectomy with ileocolic reanastomosis [id number] Dr. [name]   - AFVSS, WBC /Hgb stable - clinically ileus seems to be resolving and tolerating clears.  Will do full/dysphagia 1 type diet.  If tolerates well can consider advancing to soft later tonight. - pathology pT3pN0 (0/18 LN) -agree with eventual evaluation with medical oncology discussed at GI tumor board.  Patient asked many appropriate questions and concerns.  Relayed his experiences and family experiences.  He is try to be positive and optimistic.-   FEN: Medlock IV fluids and follow with as needed backup.  Try dysphagia 1 diet -diabetic not a candidate for metformin hypokalemia resolved  ID: perioperative cefotetan 2/27 VTE: SCD's,  lovenox Foley: removed 2/28, spont voiding Dispo: med surg, await bowel function      OSA on CPAP DM2, uncontrolled, A1c 8.8% .  SSI insulin per primary hospital service HTN   I updated the patient's status to the patient  Recommendations were made.  Questions were answered.  He expressed understanding & appreciation.     I reviewed nursing notes, hospitalist notes, last 24 h vitals and pain scores, last 48 h intake and output, last 24 h labs and trends, and last 24 h imaging results. I have reviewed this patient's available data, including medical history, events of note, test results, etc as part of my evaluation.  A significant portion of that time was spent in counseling.  Care during the described time interval was provided by me.  This care required moderate level of medical decision making.  05/01/2022    Subjective: (Chief complaint)  Patient had numerous loose bowel movements.  No more nausea or burping.  Interested in trying something more  but confesses he does not have much of an appetite.  Worried about being lightheaded and dizzy when he gets up but notes it has been there for several years.  Already seen by physical therapy.  Denies much  pain.  Had many questions and concerns.  Wish to relay his personal and family experiences.  Objective:  Vital signs:  Vitals:   04/30/22 1412 04/30/22 1414 04/30/22 2139 05/01/22 0614  BP: (!) 167/92 (!) 167/92 (!) 147/78 (!) 144/83  Pulse: 85 87 77 72  Resp:   17 16  Temp:   98.7 F (37.1 C) 98 F (36.7 C)  TempSrc:   Oral Oral  SpO2:  99% 98% 98%  Weight:      Height:        Last BM Date : 04/30/22  Intake/Output   Yesterday:  03/01 0701 - 03/02 0700 In: 3048 [P.O.:510; I.V.:2291.5; IV Piggyback:246.5] Out: 750 [Urine:750] This shift:  No intake/output data recorded.  Bowel function:  Flatus: YES  BM:  YES  Drain: (No drain)   Physical Exam:  General: Pt awake/alert in no acute distress Eyes: PERRL, normal EOM.  Sclera clear.  No icterus Neuro: CN II-XII intact w/o focal sensory/motor deficits. Lymph: No head/neck/groin lymphadenopathy Psych:  No delerium/psychosis/paranoia.  Oriented x 4.  Inquisitive and chatty.  Pleasant. HENT: Normocephalic, Mucus membranes moist.  No thrush Neck: Supple, No tracheal deviation.  No obvious thyromegaly Chest: No pain to chest wall compression.  Good respiratory excursion.  No audible wheezing CV:  Pulses intact.  Regular rhythm.  No major extremity edema MS: Normal AROM mjr joints.  No obvious deformity  Abdomen: Obese.  Somewhat firm.  Mildy distended.  Mildly tender at incisions only.  No evidence of peritonitis.  No incarcerated hernias.  Ext:   No deformity.  No mjr edema.  No cyanosis Skin: No petechiae / purpurea.  No major sores.  Warm and dry    Results:   FINAL MICROSCOPIC DIAGNOSIS:  A. 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 - Carcinoma invades into pericolonic soft tissue - Resection margins are negative for carcinoma - Negative for lymphovascular or perineural invasion - Eighteen benign lymph nodes (0/18) -  Benign unremarkable appendix - See oncology table     ONCOLOGY TABLE:   COLON AND RECTUM, CARCINOMA:  Resection, Including Transanal Disk Excision of Rectal Neoplasms  Procedure: Resection, right colon Tumor Site: Mid to distal ascending colon Tumor Size: 5.5 cm Macroscopic Tumor Perforation: Not identified Histologic Type: Adenocarcinoma Histologic Grade: G2:Moderately differentiated Multiple Primary Sites: Not applicable Tumor Extension: Carcinoma invades pericolonic soft tissue Lymphovascular Invasion: Not identified Perineural Invasion: Not identified Treatment Effect: No known presurgical therapy Margins:      Margin Status for Invasive Carcinoma: All margins negative for invasive carcinoma            Margin Status for Non-Invasive Tumor: All margins negative for high-grade dysplasia / intramucosal           carcinoma and low-grade dysplasia Regional Lymph Nodes:      Number of Lymph Nodes with Tumor: 0      Number of Lymph Nodes Examined: 18 Tumor Deposits: Not identified Distant Metastasis:      Distant Site(s) Involved: Not applicable Pathologic Stage Classification (pTNM, AJCC 8th Edition): pT3, pN0 Ancillary Studies: MMR / MSI testing will be ordered. Representative Tumor Block: A4 Comments: None  ([id number])     Lisandra Mathisen DESCRIPTION:  Specimen:  Colon resection, right, including attached appendix and portion of terminal ileum, received fresh. Specimen integrity: Intact and unopened Specimen length: There are 15 cm of terminal ileum and right colon is 40.5 cm from cecum to distal margin.  The right colon is dilated up to 10 cm, and filled with green-brown to pink-brown soft material. Mesorectal intactness: Not applicable Tumor location: Mid to distal ascending colon, posterior to posterior lateral wall. Tumor size: 2.5 cm in length, 5.5 cm in width and up to 1.7 cm thick tan-pink to red firm sessile mass with rolled, fungating edges. Percent of bowel  circumference involved: 80% Tumor distance to margins:                      Proximal: 35 cm is failed 13 cm                      Distal: 13 cm                      Radial (posterior ascending): 1.6 cm to the nonperitonealized pericolonic soft tissue margin. Macroscopic extent of tumor invasion: Involves full-thickness of the wall with involvement of underlying fat.  There is possible focal serosal involvement. Total presumed lymph nodes: Found are 18 possible lymph nodes which range from 0.2 to 1.4 cm. Extramural satellite tumor nodules: None Mucosal polyp(s): None Additional findings: The appendix is 7.9 cm in length, averages 0.8 cm in diameter, has a smooth pink serosa and unremarkable cut surfaces. Block summary: Block 1 = proximal margin Block 2 = distal margin Block 3 = mass Block 4 = mass with possible serosal involvement Block 5 = mass and adjacent mucosa Block 6 = tissue for molecular testing Block 7 = 4 possible nodes Block 8 = 4 possible nodes Block 9 = 5 possible nodes Block 10 = 4 possible nodes Blocks 11, 12 = largest node Block 13 = appendix  SW 04/28/2022     Final Diagnosis performed by Jaquita Folds, MD.   Electronically signed 04/29/2022 Technical and / or Professional components performed at [facility], [place] [address] [place], [place] [address].  Immunohistochemistry Technical component (if applicable) was performed at [facility]. [address], [place], [place], [place] [address].   IMMUNOHISTOCHEMISTRY DISCLAIMER (if applicable): Some of these immunohistochemical stains may have been developed and the performance characteristics determine by [facility]. Some may not have been cleared or approved by the U.S. Food and Drug Administration. The FDA has determined that such clearance or approval is not necessary. This test is used for clinical purposes. It should not be regarded as investigational  or for research. This laboratory is certified under the [place] (CLIA-88) as qualified to perform high complexity clinical laboratory testing.  The controls stained appropriately.   Cultures: Recent Results (from the past 720 hour(s))  Surgical pcr screen     Status: Abnormal   Collection Time: 04/27/22  1:16 AM   Specimen: Nasal Mucosa; Nasal Swab  Result Value Ref Range Status   MRSA, PCR NEGATIVE NEGATIVE Final   Staphylococcus aureus POSITIVE (A) NEGATIVE Final    Comment: (NOTE) The Xpert SA Assay (FDA approved for NASAL specimens in patients 68 years of age and older), is one component of a comprehensive surveillance program. It is not intended to diagnose infection nor to guide or monitor treatment. Performed at [facility], [place] [address]  [place], [place] [address]     Labs: Results for orders placed or performed during the hospital encounter of 04/25/22 (from the past 48 hour(s))  Osmolality     Status: None   Collection Time: 04/29/22 10:12 AM  Result Value Ref Range   Osmolality 294 275 - 295 mOsm/kg    Comment: REPEATED TO VERIFY Performed at [facility], [address], [place], [place] [address]   Glucose, capillary     Status: Abnormal   Collection Time: 04/29/22 11:35 AM  Result Value Ref Range   Glucose-Capillary 195 (H) 70 - 99 mg/dL    Comment: Glucose reference range applies only to samples taken after fasting for at least 8 hours.  Urinalysis, Routine w reflex microscopic -Urine, Clean Catch     Status: Abnormal   Collection Time: 04/29/22  1:21 PM  Result Value Ref Range   Color, Urine AMBER (A) YELLOW    Comment: BIOCHEMICALS MAY BE AFFECTED BY COLOR   APPearance CLEAR CLEAR   Specific Gravity, Urine 1.028 1.005 - 1.030   pH 6.0 5.0 - 8.0   Glucose, UA 50 (A) NEGATIVE mg/dL   Hgb urine dipstick SMALL (A) NEGATIVE   Bilirubin Urine NEGATIVE NEGATIVE   Ketones, ur 20 (A)  NEGATIVE mg/dL   Protein, ur 100 (A) NEGATIVE mg/dL   Nitrite NEGATIVE NEGATIVE   Leukocytes,Ua NEGATIVE NEGATIVE   RBC / HPF 11-20 0 - 5 RBC/hpf   WBC, UA 0-5 0 - 5 WBC/hpf   Bacteria, UA RARE (A) NONE SEEN   Squamous Epithelial / HPF 0-5 0 - 5 /HPF   Mucus PRESENT     Comment: Performed at [facility], [place] [address] [place], [place] [address]  Sodium, urine, random     Status: None   Collection Time: 04/29/22  1:21 PM  Result Value Ref Range   Sodium, Ur 57 mmol/L    Comment: Performed at [facility], [place] [address] [place], [place] [address]  Creatinine, urine, random     Status: None   Collection Time: 04/29/22  1:21 PM  Result Value Ref Range   Creatinine, Urine 155 mg/dL    Comment: Performed at [facility], [place] [address], [place], [place] [address]  Osmolality, urine     Status: None   Collection Time: 04/29/22  1:21 PM  Result Value Ref Range   Osmolality, Ur 839 300 - 900 mOsm/kg    Comment: Performed at [place] [address] [place], [place] [address]  Glucose, capillary     Status: Abnormal   Collection Time: 04/29/22  4:42 PM  Result Value Ref Range   Glucose-Capillary 185 (H) 70 - 99 mg/dL    Comment: Glucose reference range applies only to samples taken after fasting for at least 8 hours.  Glucose, capillary     Status: Abnormal   Collection Time: 04/29/22  8:56 PM  Result Value Ref Range   Glucose-Capillary 189 (H) 70 - 99 mg/dL    Comment: Glucose reference range applies only to samples taken after fasting for at least 8 hours.  CBC with Differential/Platelet     Status: None   Collection Time: 04/30/22  5:13 AM  Result Value Ref Range   WBC 7.9 4.0 - 10.5 K/uL   RBC 4.82 4.22 - 5.81 MIL/uL   Hemoglobin 13.5 13.0 - 17.0 g/dL   HCT 41.5 39.0 - 52.0 %   MCV 86.1 80.0 - 100.0  fL   MCH 28.0 26.0 - 34.0 pg   MCHC 32.5 30.0 - 36.0 g/dL   RDW 13.1 11.5 - 15.5 %   Platelets 212 150 -  400 K/uL   nRBC 0.0 0.0 - 0.2 %   Neutrophils Relative % 74 %   Neutro Abs 5.9 1.7 - 7.7 K/uL   Lymphocytes Relative 10 %   Lymphs Abs 0.8 0.7 - 4.0 K/uL   Monocytes Relative 11 %   Monocytes Absolute 0.9 0.1 - 1.0 K/uL   Eosinophils Relative 3 %   Eosinophils Absolute 0.2 0.0 - 0.5 K/uL   Basophils Relative 1 %   Basophils Absolute 0.0 0.0 - 0.1 K/uL   Immature Granulocytes 1 %   Abs Immature Granulocytes 0.06 0.00 - 0.07 K/uL    Comment: Performed at [facility], [place] [address], [place], [place] [address]  Magnesium     Status: None   Collection Time: 04/30/22  5:13 AM  Result Value Ref Range   Magnesium 2.1 1.7 - 2.4 mg/dL    Comment: Performed at [facility], [place] [address], [place], [place] [address]  Renal function panel     Status: Abnormal   Collection Time: 04/30/22  5:13 AM  Result Value Ref Range   Sodium 131 (L) 135 - 145 mmol/L   Potassium 4.6 3.5 - 5.1 mmol/L   Chloride 101 98 - 111 mmol/L   CO2 22 22 - 32 mmol/L   Glucose, Bld 184 (H) 70 - 99 mg/dL    Comment: Glucose reference range applies only to samples taken after fasting for at least 8 hours.   BUN 19 8 - 23 mg/dL   Creatinine, Ser 0.98 0.61 - 1.24 mg/dL   Calcium 7.9 (L) 8.9 - 10.3 mg/dL   Phosphorus 1.8 (L) 2.5 - 4.6 mg/dL   Albumin 2.9 (L) 3.5 - 5.0 g/dL   GFR, Estimated >60 >60 mL/min    Comment: (NOTE) Calculated using the CKD-EPI Creatinine Equation (2021)    Anion gap 8 5 - 15    Comment: Performed at [facility], [place] [address] [place], [place] [address]  Glucose, capillary     Status: Abnormal   Collection Time: 04/30/22  7:18 AM  Result Value Ref Range   Glucose-Capillary 195 (H) 70 - 99 mg/dL    Comment: Glucose reference range applies only to samples taken after fasting for at least 8 hours.  Glucose, capillary     Status: Abnormal   Collection Time: 04/30/22 11:40 AM  Result Value Ref Range   Glucose-Capillary 189 (H) 70 - 99  mg/dL    Comment: Glucose reference range applies only to samples taken after fasting for at least 8 hours.  Glucose, capillary     Status: Abnormal   Collection Time: 04/30/22  4:52 PM  Result Value Ref Range   Glucose-Capillary 171 (H) 70 - 99 mg/dL    Comment: Glucose reference range applies only to samples taken after fasting for at least 8 hours.  Glucose, capillary     Status: Abnormal   Collection Time: 04/30/22  9:36 PM  Result Value Ref Range   Glucose-Capillary 153 (H) 70 - 99 mg/dL    Comment: Glucose reference range applies only to samples taken after fasting for at least 8 hours.  Comprehensive metabolic panel     Status: Abnormal   Collection Time: 05/01/22  4:58 AM  Result Value Ref Range   Sodium 134 (L) 135 -  145 mmol/L   Potassium 4.4 3.5 - 5.1 mmol/L   Chloride 104 98 - 111 mmol/L   CO2 23 22 - 32 mmol/L   Glucose, Bld 165 (H) 70 - 99 mg/dL    Comment: Glucose reference range applies only to samples taken after fasting for at least 8 hours.   BUN 20 8 - 23 mg/dL   Creatinine, Ser 1.05 0.61 - 1.24 mg/dL   Calcium 8.0 (L) 8.9 - 10.3 mg/dL   Total Protein 5.8 (L) 6.5 - 8.1 g/dL   Albumin 2.6 (L) 3.5 - 5.0 g/dL   AST 14 (L) 15 - 41 U/L   ALT 15 0 - 44 U/L   Alkaline Phosphatase 49 38 - 126 U/L   Total Bilirubin 2.0 (H) 0.3 - 1.2 mg/dL   GFR, Estimated >60 >60 mL/min    Comment: (NOTE) Calculated using the CKD-EPI Creatinine Equation (2021)    Anion gap 7 5 - 15    Comment: Performed at [facility], [place] [address] [place], [place] [address]  CBC     Status: Abnormal   Collection Time: 05/01/22  4:58 AM  Result Value Ref Range   WBC 5.7 4.0 - 10.5 K/uL   RBC 4.37 4.22 - 5.81 MIL/uL   Hemoglobin 12.3 (L) 13.0 - 17.0 g/dL   HCT 38.5 (L) 39.0 - 52.0 %   MCV 88.1 80.0 - 100.0 fL   MCH 28.1 26.0 - 34.0 pg   MCHC 31.9 30.0 - 36.0 g/dL   RDW 13.2 11.5 - 15.5 %   Platelets 210 150 - 400 K/uL   nRBC 0.0 0.0 - 0.2 %    Comment: Performed at  [facility], [place] [address], [place], [place] [address]  Magnesium     Status: None   Collection Time: 05/01/22  4:58 AM  Result Value Ref Range   Magnesium 2.1 1.7 - 2.4 mg/dL    Comment: Performed at [facility], [place] [address] [place], [place] [address]  Phosphorus     Status: None   Collection Time: 05/01/22  4:58 AM  Result Value Ref Range   Phosphorus 2.8 2.5 - 4.6 mg/dL    Comment: Performed at [facility], [place] [address] [place], [place] [address]  Glucose, capillary     Status: Abnormal   Collection Time: 05/01/22  7:22 AM  Result Value Ref Range   Glucose-Capillary 159 (H) 70 - 99 mg/dL    Comment: Glucose reference range applies only to samples taken after fasting for at least 8 hours.    Imaging / Studies: CT CHEST WO CONTRAST  Result Date: 04/30/2022 CLINICAL DATA:  Colon cancer, for staging EXAM: CT CHEST WITHOUT CONTRAST TECHNIQUE: Multidetector CT imaging of the chest was performed following the standard protocol without IV contrast. RADIATION DOSE REDUCTION: This exam was performed according to the departmental dose-optimization program which includes automated exposure control, adjustment of the mA and/or kV according to patient size and/or use of iterative reconstruction technique. COMPARISON:  CT abdomen/pelvis dated 04/25/2022 month FINDINGS: Cardiovascular: The heart is normal in size. Trace inferior pericardial fluid. No evidence of thoracic aortic aneurysm. Very mild atherosclerotic calcifications of the arch. Moderate three-coronary atherosclerosis. Mediastinum/Nodes: No suspicious mediastinal lymphadenopathy. Visualized thyroid is unremarkable. Lungs/Pleura: Evaluation of the lung parenchyma is constrained by respiratory motion. Within that constraint, there are no suspicious pulmonary nodules. Small bilateral pleural effusions. Mild scarring/atelectasis in the bilateral lower lobes. No focal consolidation. No  pneumothorax. Upper Abdomen: Visualized upper abdomen is notable for mildly dilated loops of small bowel in the central abdomen, likely reflecting adynamic postoperative ileus in the setting of recent surgery. Trace perihepatic ascites. No free air. Musculoskeletal: Degenerative changes of the visualized thoracolumbar spine. IMPRESSION: No evidence of metastatic disease in the chest. Small bilateral pleural effusions. Aortic Atherosclerosis (ICD10-I70.0). Electronically Signed   By: Julian Hy M.D.   On: 04/30/2022 17:09   DG Abd Portable 1V  Result Date: 04/30/2022 CLINICAL DATA:  Abdominal distension EXAM: PORTABLE ABDOMEN - 1 VIEW COMPARISON:  04/25/2022 FINDINGS: Interval removal of NG tube. Several dilated loops of small bowel in the mid abdomen measuring up to 5 cm. There is gas in the rectum. IMPRESSION: Dilated small bowel with gas the rectum. Differential includes early small bowel obstruction versus ileus. Electronically Signed   By: Suzy Bouchard M.D.   On: 04/30/2022 08:50    Medications / Allergies: per chart  Antibiotics: Anti-infectives (From admission, onward)    Start     Dose/Rate Route Frequency Ordered Stop   04/27/22 0645  cefoTEtan (CEFOTAN) 2 g in sodium chloride 0.9 % 100 mL IVPB        2 g 200 mL/hr over 30 Minutes Intravenous On call to O.R. 04/27/22 [id number] 04/27/22 0802         Note: Portions of this report may have been transcribed using voice recognition software. Every effort was made to ensure accuracy; however, inadvertent computerized transcription errors may be present.   Any transcriptional errors that result from this process are unintentional.    Adin Hector, MD, FACS, MASCRS Esophageal, Gastrointestinal & Colorectal Surgery Robotic and Minimally Invasive Surgery  [place]. [address] [place], [place] [address] [phone] Fax [phone] Main  CONTACT  INFORMATION:  Weekday (9AM-5PM): Call [facility] main office at [phone]  Weeknight (5PM-9AM) or Weekend/Holiday: Check [email or web address] (password " [email or web address]") for General Surgery [facility] coverage  (Please, do not use SecureChat as it is not reliable communication to reach operating surgeons for immediate patient care given surgeries/outpatient duties/clinic/cross-coverage/off post-call which would lead to a delay in care.  Epic staff messaging available for outptient concerns, but may not be answered for 48 hours or more).     05/01/2022  8:22 AM

## 2022-05-02 ENCOUNTER — Other Ambulatory Visit: Payer: Self-pay | Admitting: Family Medicine

## 2022-05-02 DIAGNOSIS — E1169 Type 2 diabetes mellitus with other specified complication: Secondary | ICD-10-CM | POA: Diagnosis not present

## 2022-05-02 DIAGNOSIS — E871 Hypo-osmolality and hyponatremia: Secondary | ICD-10-CM | POA: Diagnosis not present

## 2022-05-02 DIAGNOSIS — K56609 Unspecified intestinal obstruction, unspecified as to partial versus complete obstruction: Secondary | ICD-10-CM | POA: Diagnosis not present

## 2022-05-02 DIAGNOSIS — G4733 Obstructive sleep apnea (adult) (pediatric): Secondary | ICD-10-CM | POA: Diagnosis not present

## 2022-05-02 LAB — COMPREHENSIVE METABOLIC PANEL
ALT: 22 U/L (ref 0–44)
AST: 22 U/L (ref 15–41)
Albumin: 2.8 g/dL — ABNORMAL LOW (ref 3.5–5.0)
Alkaline Phosphatase: 70 U/L (ref 38–126)
Anion gap: 9 (ref 5–15)
BUN: 21 mg/dL (ref 8–23)
CO2: 25 mmol/L (ref 22–32)
Calcium: 8.2 mg/dL — ABNORMAL LOW (ref 8.9–10.3)
Chloride: 103 mmol/L (ref 98–111)
Creatinine, Ser: 1.04 mg/dL (ref 0.61–1.24)
GFR, Estimated: >60 mL/min (ref >60)
Glucose, Bld: 167 mg/dL — ABNORMAL HIGH (ref 70–99)
Potassium: 3.9 mmol/L (ref 3.5–5.1)
Sodium: 137 mmol/L (ref 135–145)
Total Bilirubin: 1.4 mg/dL — ABNORMAL HIGH (ref 0.3–1.2)
Total Protein: 5.8 g/dL — ABNORMAL LOW (ref 6.5–8.1)

## 2022-05-02 LAB — GLUCOSE, CAPILLARY
Glucose-Capillary: 148 mg/dL — ABNORMAL HIGH (ref 70–99)
Glucose-Capillary: 162 mg/dL — ABNORMAL HIGH (ref 70–99)
Glucose-Capillary: 165 mg/dL — ABNORMAL HIGH (ref 70–99)
Glucose-Capillary: 147 mg/dL — ABNORMAL HIGH (ref 70–99)

## 2022-05-02 LAB — MAGNESIUM: Magnesium: 2 mg/dL (ref 1.7–2.4)

## 2022-05-02 NOTE — Progress Notes (Signed)
PROGRESS NOTE    Noah Lane  [id number] DOB: 11/03/1955 DOA: 04/25/2022 PCP: [name], DO    Chief Complaint  Patient presents with   Abdominal Pain    Brief Narrative:  Patient pleasant 67 year old gentleman history of type 2 diabetes, hyperlipidemia, OSA on CPAP, obesity presented with abdominal pain and distention, right lower quadrant, no bowel movements on the day of admission. Patient underwent CT scan done concerning for near obstructing mass at the hepatic flexure worrisome for colon cancer.  Loss of stool proximal to this area.  No evidence of perforation.  Patient admitted.  General surgery consulted who recommended GI input.  Patient assessed by GI who reviewed films, patient noted to have a high-grade colonic obstruction of the hepatic flexure concerning for malignancy with ascending colon dilatation, not a candidate for bowel prep due to high-grade obstruction with proximal colon dilation, no role for colonoscopy of stent and recommended surgical management. Patient subsequently underwent laparoscopic assisted right hemicolectomy with ileocolic reanastomosis by general surgery on 04/27/2022.   Assessment & Plan:   Principal Problem:   Colonic obstruction (HCC) Active Problems:   Diabetes mellitus type 2 in obese (HCC)   OSA on CPAP   Obesity (BMI 30.0-34.9)   Hyponatremia   Hypokalemia   Hyperlipidemia   Gastroesophageal reflux disease   Ileus, postoperative (HCC)  #1 colonic obstruction at level of hepatic flexure suspicious for malignancy (colonic adenocarcinoma) -Patient had presented with right lower quadrant abdominal pain, no bowel movement, seen in the ED CT abdomen and pelvis done concerning for near obstructing mass at the hepatic flexure concerning for colon cancer. -Patient admitted, seen in consultation by general surgery who recommended evaluation by GI prior to any surgical input. -Patient seen in consultation by GI who reviewed  films and felt patient had a high-grade colonic obstruction at hepatic flexure concerning for malignancy and the ascending colonic dilatation, not a candidate for bowel prep due to high-grade obstruction with proximal colon dilatation, no role for colonoscopy or stenting regimen commended surgical management. -Patient subsequently underwent laparoscopic assisted right hemicolectomy with ileocolic reanastomosis per Dr. [name] 04/27/2022.  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 -General surgery ordered a staging chest CT(negative for metastatic disease in the chest) and baseline CEA ordered, and referral placed to medical oncology at discharge per general surgery. -Patient followed by general surgery, NG tube removed, patient placed on clear liquids which he tolerated and currently is on a dysphagia 1 diet per general surgery.   -Initially with bowel movements however no bowel movement in the past 2 days per patient.  -Concern for postop ileus. -Keep potassium approximately 4, magnesium approximately 2. -Mobilize. -Per general surgery.   -2 probable postop ileus -Patient initially postoperatively with complaints of reflux symptoms, abdominal bloating, feels like trapped gas.  -Patient with clinical improvement initially, abdominal bloating improving, reflux symptoms improved, was having bowel movements however states has not had a bowel movement in 2 days now.  -Keep potassium approximately 4, magnesium approximately 2. -Patient advanced to a dysphagia 1 diet per general surgery.   -Mobilize.   -Per general surgery.   3.  Diabetes mellitus type 2 -Hemoglobin A1c 8.8 (04/22/2022) -CBG 148 this morning. -Continue to hold oral hypoglycemic agents.   -Continue Semglee 10 units daily, SSI.    4.  Hyperlipidemia -Continue to hold statin. -May resume on discharge.  5.  GERD -Clinical improvement.   -Continue PPI.   -GI cocktail as needed.   6.  OSA -Noted to  be on CPAP at home.  7.  Hypokalemia/hypophosphatemia -Status post IV KCl. -Potassium of 3.9, magnesium at 2.  Phosphorus at 2.8.   -Follow.  8.  Hyponatremia -Improved, sodium at 137.   -IV fluids discontinued.   -Repeat labs in the AM.      DVT prophylaxis: Lovenox Code Status: Full Family Communication: Updated patient.  No family at bedside.  Disposition: Home when clinically improved, tolerating oral intake, cleared by general surgery.  Status is: Inpatient Remains inpatient appropriate because: Severity of illness   Consultants:  General surgery: [name] 04/25/2022 General surgery: [name] Plan 04/26/2022 Wound care RN: [name] 04/26/2022  Procedures:  CT abdomen and pelvis 04/25/2022 Abdominal films 04/25/2022 Laparoscopic assisted right hemicolectomy with ileocolic reanastomosis per [name] 04/27/2022  Antimicrobials:  Anti-infectives (From admission, onward)    Start     Dose/Rate Route Frequency Ordered Stop   04/27/22 0645  cefoTEtan (CEFOTAN) 2 g in sodium chloride 0.9 % 100 mL IVPB        2 g 200 mL/hr over 30 Minutes Intravenous On call to O.R. 04/27/22 ED:[id number] 04/27/22 0802         Subjective: Patient laying in bed.  No bowel movement in 2 days.  Passing flatus.  Denies any reflux symptoms.  No chest pain or shortness of breath.  Feels abdominal soreness's pain is slowly improving.    Objective: Vitals:   05/01/22 1209 05/01/22 2124 05/02/22 0429 05/02/22 1333  BP: (!) 152/71 (!) 167/90 (!) 143/125 (!) 153/77  Pulse: 73 73 62 (!) 59  Resp: '18 17 18   '$ Temp: 98.7 F (37.1 C) 98.9 F (37.2 C) 97.9 F (36.6 C) 98.3 F (36.8 C)  TempSrc: Oral Oral Oral Oral  SpO2: 97% 96% 98% 98%  Weight:      Height:        Intake/Output Summary (Last 24 hours) at 05/02/2022 1402 Last data filed at 05/02/2022 1329 Gross per 24 hour  Intake 240.76 ml  Output 200 ml  Net 40.76 ml    Filed Weights   04/25/22 1145 04/27/22 0656  Weight: 117 kg 117 kg     Examination:  General exam: NAD. Respiratory system: Lungs clear to auscultation bilaterally anterior lung fields.  No wheezes, no crackles, no rhonchi.  Fair air movement.  Speaking in full sentences.   Cardiovascular system: RRR no murmurs rubs or gallops.  No JVD.  No lower extremity edema.  Gastrointestinal system: Abdomen mildly distended, soft, decreased diffuse tenderness to palpation.  Hypoactive bowel sounds.  No rebound.  No guarding.  Central nervous system: Alert and oriented x 3.  Moving extremities spontaneously.  No focal neurological deficits.   Extremities: Symmetric 5 x 5 power. Skin: No rashes, lesions or ulcers Psychiatry: Judgement and insight appear normal. Mood & affect appropriate.     Data Reviewed: I have personally reviewed following labs and imaging studies  CBC: Recent Labs  Lab 04/26/22 0404 04/28/22 0423 04/29/22 0416 04/30/22 0513 05/01/22 0458  WBC 9.4 8.0 7.8 7.9 5.7  NEUTROABS  --   --  6.0 5.9  --   HGB 13.8 14.0 13.3 13.5 12.3*  HCT 41.0 42.3 40.8 41.5 38.5*  MCV 85.6 86.7 86.6 86.1 88.1  PLT 143* 186 169 212 210     Basic Metabolic Panel: Recent Labs  Lab 04/28/22 0423 04/29/22 0602 04/30/22 0513 05/01/22 0458 05/02/22 0413  NA 138 129* 131* 134* 137  K 3.4* 3.9 4.6  4.4 3.9  CL 99 95* 101 104 103  CO2 '28 26 22 23 25  '$ GLUCOSE 199* 186* 184* 165* 167*  BUN 24* '20 19 20 21  '$ CREATININE 1.02 0.94 0.98 1.05 1.04  CALCIUM 8.0* 7.7* 7.9* 8.0* 8.2*  MG 2.2 2.3 2.1 2.1 2.0  PHOS  --   --  1.8* 2.8  --      GFR: Estimated Creatinine Clearance: 91.1 mL/min (by C-G formula based on SCr of 1.04 mg/dL).  Liver Function Tests: Recent Labs  Lab 04/28/22 0423 04/29/22 0602 04/30/22 0513 05/01/22 0458 05/02/22 0413  AST 15 18  --  14* 22  ALT 16 15  --  15 22  ALKPHOS 40 44  --  49 70  BILITOT 2.9* 4.7*  --  2.0* 1.4*  PROT 6.5 6.5  --  5.8* 5.8*  ALBUMIN 3.6 3.4* 2.9* 2.6* 2.8*     CBG: Recent Labs  Lab  05/01/22 1208 05/01/22 1629 05/01/22 2123 05/02/22 0745 05/02/22 1112  GLUCAP 166* 167* 177* 148* 162*      Recent Results (from the past 240 hour(s))  Surgical pcr screen     Status: Abnormal   Collection Time: 04/27/22  1:16 AM   Specimen: Nasal Mucosa; Nasal Swab  Result Value Ref Range Status   MRSA, PCR NEGATIVE NEGATIVE Final   Staphylococcus aureus POSITIVE (A) NEGATIVE Final    Comment: (NOTE) The Xpert SA Assay (FDA approved for NASAL specimens in patients 84 years of age and older), is one component of a comprehensive surveillance program. It is not intended to diagnose infection nor to guide or monitor treatment. Performed at [facility], [place] [address] [place], [place] [address]          Radiology Studies: CT CHEST WO CONTRAST  Result Date: 04/30/2022 CLINICAL DATA:  Colon cancer, for staging EXAM: CT CHEST WITHOUT CONTRAST TECHNIQUE: Multidetector CT imaging of the chest was performed following the standard protocol without IV contrast. RADIATION DOSE REDUCTION: This exam was performed according to the departmental dose-optimization program which includes automated exposure control, adjustment of the mA and/or kV according to patient size and/or use of iterative reconstruction technique. COMPARISON:  CT abdomen/pelvis dated 04/25/2022 month FINDINGS: Cardiovascular: The heart is normal in size. Trace inferior pericardial fluid. No evidence of thoracic aortic aneurysm. Very mild atherosclerotic calcifications of the arch. Moderate three-coronary atherosclerosis. Mediastinum/Nodes: No suspicious mediastinal lymphadenopathy. Visualized thyroid is unremarkable. Lungs/Pleura: Evaluation of the lung parenchyma is constrained by respiratory motion. Within that constraint, there are no suspicious pulmonary nodules. Small bilateral pleural effusions. Mild scarring/atelectasis in the bilateral lower lobes. No focal consolidation. No pneumothorax. Upper  Abdomen: Visualized upper abdomen is notable for mildly dilated loops of small bowel in the central abdomen, likely reflecting adynamic postoperative ileus in the setting of recent surgery. Trace perihepatic ascites. No free air. Musculoskeletal: Degenerative changes of the visualized thoracolumbar spine. IMPRESSION: No evidence of metastatic disease in the chest. Small bilateral pleural effusions. Aortic Atherosclerosis (ICD10-I70.0). Electronically Signed   By: Julian Hy M.D.   On: 04/30/2022 17:09        Scheduled Meds:  acetaminophen  1,000 mg Oral TID   enoxaparin (LOVENOX) injection  40 mg Subcutaneous Q24H   feeding supplement  237 mL Oral TID WC   insulin aspart  0-5 Units Subcutaneous QHS   insulin aspart  0-9 Units Subcutaneous TID WC   insulin glargine-yfgn  10 Units Subcutaneous Daily   lidocaine  1 patch Transdermal  Q24H   lidocaine  15 mL Oral Once   lip balm   Topical BID   metoprolol tartrate  5 mg Intravenous Q6H   pantoprazole (PROTONIX) IV  40 mg Intravenous Q24H   polycarbophil  625 mg Oral BID   sodium chloride flush  3 mL Intravenous Q12H   Continuous Infusions:  sodium chloride     lactated ringers     methocarbamol (ROBAXIN) IV 500 mg (05/02/22 1304)     LOS: 7 days    Time spent: 40 minutes    Irine Seal, MD [facility]   To contact the attending provider between 7A-7P or the covering provider during after hours 7P-7A, please log into the web site [email or web address] and access using universal [place] password for that web site. If you do not have the password, please call the hospital operator.  05/02/2022, 2:02 PM

## 2022-05-02 NOTE — Progress Notes (Signed)
Physical Therapy Treatment and D/C  Patient Details Name: Noah Lane MRN: [id number] DOB: [birth year] Today's Date: 05/02/2022   History of Present Illness Pt is a 67 yo male s/p Laparoscopic-assisted right hemicolectomy with ileocolic reanastomosis [id number] Dr. [name]. PMH: diabetes    PT Comments    Pt seen for continued gait, mobility and stair training; see below for details. No further PT indicated at this time. D/c PT   Recommendations for follow up therapy are one component of a multi-disciplinary discharge planning process, led by the attending physician.  Recommendations may be updated based on patient status, additional functional criteria and insurance authorization.  Follow Up Recommendations  No PT follow up     Assistance Recommended at Discharge PRN  Patient can return home with the following Assistance with cooking/housework   Equipment Recommendations  Rolling walker (2 wheels)    Recommendations for Other Services       Precautions / Restrictions Precautions Precautions: Fall Precaution Comments: open abdominal surgery/incision Restrictions Weight Bearing Restrictions: No     Mobility  Bed Mobility               General bed mobility comments: in recliner    Transfers Overall transfer level: Modified independent Equipment used: Rolling walker (2 wheels)                    Ambulation/Gait Ambulation/Gait assistance: Modified independent (Device/Increase time)   Assistive device: Rolling walker (2 wheels) Gait Pattern/deviations: Step-through pattern       General Gait Details: pt observed by  PT amb in hall multiple times today   Stairs Stairs: Yes Stairs assistance: Supervision Stair Management: No rails, One rail Right, One rail Left, Step to pattern, Alternating pattern, Forwards Number of Stairs: 7 (x3) General stair comments: up/down with one rail alternating pattern, supervision no LOB; up/down with no rails, step to  pattern, supervision to min/guard  for safety   Wheelchair Mobility    Modified Rankin (Stroke Patients Only)       Balance                                            Cognition Arousal/Alertness: Awake/alert Behavior During Therapy: WFL for tasks assessed/performed Overall Cognitive Status: Within Functional Limits for tasks assessed                                          Exercises      General Comments General comments (skin integrity, edema, etc.): educated pt on gentle back stretches, gentle knee to chest, gentle rotation within limits of abd discomfort      Pertinent Vitals/Pain Pain Assessment Pain Assessment: Faces Faces Pain Scale: Hurts a little bit Pain Location: abdomen Pain Descriptors / Indicators: Discomfort Pain Intervention(s): Limited activity within patient's tolerance, Monitored during session    Home Living                          Prior Function            PT Goals (current goals can now be found in the care plan section) Acute Rehab PT Goals Patient Stated Goal: practice stairs PT Goal Formulation: With patient Time For Goal Achievement: 05/13/22 Potential to Achieve Goals:  Good Progress towards PT goals: Goals met and updated - see care plan    Frequency           PT Plan Other (comment) (d/c PT)    Co-evaluation              AM-PAC PT "6 Clicks" Mobility   Outcome Measure  Help needed turning from your back to your side while in a flat bed without using bedrails?: A Little Help needed moving from lying on your back to sitting on the side of a flat bed without using bedrails?: A Little Help needed moving to and from a bed to a chair (including a wheelchair)?: None Help needed standing up from a chair using your arms (e.g., wheelchair or bedside chair)?: None Help needed to walk in hospital room?: None Help needed climbing 3-5 steps with a railing? : None 6 Click Score:  22    End of Session   Activity Tolerance: Patient tolerated treatment well Patient left: in chair;with call bell/phone within reach   PT Visit Diagnosis: Other abnormalities of gait and mobility (R26.89)     Time: 1451-1510 PT Time Calculation (min) (ACUTE ONLY): 19 min  Charges:  $Gait Training: 8-22 mins                     Baxter Flattery, PT  Acute Rehab Dept [facility]) [phone]  [facility] Weekend Pager (Saturday/Sunday only)  [phone]  05/02/2022    [facility] 05/02/2022, 3:54 PM

## 2022-05-02 NOTE — Progress Notes (Signed)
Shaffer Carry [id number] [birth year]  CARE TEAM:  PCP: [name], DO  Outpatient Care Team: Patient Care Team: [name], DO as PCP - General (Family Medicine)  Inpatient Treatment Team: Treatment Team: Attending Provider: Eugenie Filler, MD; Consulting Physician: Edison Pace, Md, MD; Rounding Team: Fatima Blank, MD; Registered Nurse: Alva Garnet, RN; Physical Therapist: Neil Crouch, PT; Utilization Review: Claudie Leach, RN   Problem List:   Principal Problem:   Colonic obstruction [facility]) Active Problems:   Diabetes mellitus type 2 in obese ([place])   OSA on CPAP   Obesity (BMI 30.0-34.9)   Hyponatremia   Hypokalemia   Hyperlipidemia   Gastroesophageal reflux disease   Ileus, postoperative ([place])   5 Days Post-Op  04/27/2022  Preoperative Diagnosis: Obstructive right colon mass  Postoperative Diagnosis: Same   Procedure: Laparoscopic-assisted right hemicolectomy with ileocolic reanastomosis   Surgeon: Michaelle Birks, MD  Findings:  Firm mass at the hepatic flexure, highly suspicious for malignancy, with dilation of the ascending colon. Normal caliber of the small bowel. No evidence of carcinomatosis or metastatic disease within the abdomen.   A. 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  [facility] Stay = 7  days)  Assessment/Plan Hepatic flexure mass causing LBO  s/p Laparoscopic-assisted right hemicolectomy with ileocolic reanastomosis [id number] Dr. [name]  5 Days Post-Op  - AFVSS, WBC /Hgb stable - clinically ileus seems to be resolving and tolerating clears.  Will do full/dysphagia 1 type diet.  If tolerates well can consider advancing to soft later tonight. - pathology pT3pN0 (0/18 LN) -agree with eventual evaluation with medical oncology discussed at GI tumor board.  Patient asked many appropriate questions and concerns.  Relayed his experiences and family experiences.  He is try to be positive and optimistic.-   FEN: Medlock IV fluids and follow with as needed backup.  Still seems somewhat bloated with only mild flatus.  Keep on dysphagia 1 diet -diabetic -SSI per primary hospitalist service.   hypokalemia resolved.  Mildly elevated LFTs gradually resolving ID: perioperative cefotetan 2/27 VTE: SCD's,  lovenox Foley: removed 2/28, voiding Dispo: med surg floor, await bowel function      OSA on CPAP DM2, uncontrolled, A1c 8.8% .  SSI insulin per primary hospital service.  Normally on metformin at home HTN   I updated the patient's status to the patient  Recommendations were made.  Questions were answered.  He expressed understanding & appreciation.     I reviewed nursing notes, hospitalist notes, last 24 h vitals and pain scores, last 48 h intake and output, last 24 h labs and trends, and last 24 h imaging results. I have reviewed this patient's available data, including medical history, events of note, test results, etc as part of my evaluation.  A significant portion of that time was spent in counseling.  Care during the described time interval was provided by me.  This care required moderate level of  medical decision making.  05/02/2022    Subjective: (Chief complaint)  Patient tolerating dysphagia diet but not much appetite.  No bowel movements.  Just some flatus.  Feels a little  bloated.  Sitting up in chair.  Again very appreciative of care on 3 E. Floor, especially the nurses and nurse assistants  Objective:  Vital signs:  Vitals:   05/01/22 0614 05/01/22 1209 05/01/22 2124 05/02/22 0429  BP: (!) 144/83 (!) 152/71 (!) 167/90 (!) 143/125  Pulse: 72 73 73 62  Resp: '16 18 17 18  '$ Temp: 98 F (36.7 C) 98.7 F (37.1 C) 98.9 F (37.2 C) 97.9 F (36.6 C)  TempSrc: Oral Oral Oral Oral  SpO2: 98% 97% 96% 98%  Weight:      Height:        Last BM Date : 04/30/22  Intake/Output   Yesterday:  03/02 0701 - 03/03 0700 In: 120.8 [IV Piggyback:120.8] Out: 700 [Urine:700] This shift:  No intake/output data recorded.  Bowel function:  Flatus: YES  BM:  No  Drain: (No drain)   Physical Exam:  General: Pt awake/alert in no acute distress Eyes: PERRL, normal EOM.  Sclera clear.  No icterus Neuro: CN II-XII intact w/o focal sensory/motor deficits. Lymph: No head/neck/groin lymphadenopathy Psych:  No delerium/psychosis/paranoia.  Oriented x 4.  Inquisitive and chatty.  Pleasant. HENT: Normocephalic, Mucus membranes moist.  No thrush Neck: Supple, No tracheal deviation.  No obvious thyromegaly Chest: No pain to chest wall compression.  Good respiratory excursion.  No audible wheezing CV:  Pulses intact.  Regular rhythm.  No major extremity edema MS: Normal AROM mjr joints.  No obvious deformity  Abdomen: Obese.  Somewhat firm.  Mildy distended.  Nontender.  No evidence of peritonitis.  No incarcerated hernias.  Ext:   No deformity.  No mjr edema.  No cyanosis Skin: No petechiae / purpurea.  No major sores.  Warm and dry    Results:   FINAL MICROSCOPIC DIAGNOSIS:  A. 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 - Carcinoma invades into pericolonic soft tissue - Resection margins are negative for carcinoma - Negative for lymphovascular or perineural invasion -  Eighteen benign lymph nodes (0/18) - Benign unremarkable appendix - See oncology table     ONCOLOGY TABLE:   COLON AND RECTUM, CARCINOMA:  Resection, Including Transanal Disk Excision of Rectal Neoplasms  Procedure: Resection, right colon Tumor Site: Mid to distal ascending colon Tumor Size: 5.5 cm Macroscopic Tumor Perforation: Not identified Histologic Type: Adenocarcinoma Histologic Grade: G2:Moderately differentiated Multiple Primary Sites: Not applicable Tumor Extension: Carcinoma invades pericolonic soft tissue Lymphovascular Invasion: Not identified Perineural Invasion: Not identified Treatment Effect: No known presurgical therapy Margins:      Margin Status for Invasive Carcinoma: All margins negative for invasive carcinoma            Margin Status for Non-Invasive Tumor: All margins negative for high-grade dysplasia / intramucosal           carcinoma and low-grade dysplasia Regional Lymph Nodes:      Number of Lymph Nodes with Tumor: 0      Number of Lymph Nodes Examined: 18 Tumor Deposits: Not identified Distant Metastasis:      Distant Site(s) Involved: Not applicable Pathologic Stage Classification (pTNM, AJCC 8th Edition): pT3, pN0 Ancillary Studies: MMR / MSI testing will be ordered. Representative Tumor Block: A4 Comments: None  ([id number])     [place] DESCRIPTION:  Specimen: Colon  resection, right, including attached appendix and portion of terminal ileum, received fresh. Specimen integrity: Intact and unopened Specimen length: There are 15 cm of terminal ileum and right colon is 40.5 cm from cecum to distal margin.  The right colon is dilated up to 10 cm, and filled with green-brown to pink-brown soft material. Mesorectal intactness: Not applicable Tumor location: Mid to distal ascending colon, posterior to posterior lateral wall. Tumor size: 2.5 cm in length, 5.5 cm in width and up to 1.7 cm thick tan-pink to red firm sessile mass with rolled,  fungating edges. Percent of bowel circumference involved: 80% Tumor distance to margins:                      Proximal: 35 cm is failed 13 cm                      Distal: 13 cm                      Radial (posterior ascending): 1.6 cm to the nonperitonealized pericolonic soft tissue margin. Macroscopic extent of tumor invasion: Involves full-thickness of the wall with involvement of underlying fat.  There is possible focal serosal involvement. Total presumed lymph nodes: Found are 18 possible lymph nodes which range from 0.2 to 1.4 cm. Extramural satellite tumor nodules: None Mucosal polyp(s): None Additional findings: The appendix is 7.9 cm in length, averages 0.8 cm in diameter, has a smooth pink serosa and unremarkable cut surfaces. Block summary: Block 1 = proximal margin Block 2 = distal margin Block 3 = mass Block 4 = mass with possible serosal involvement Block 5 = mass and adjacent mucosa Block 6 = tissue for molecular testing Block 7 = 4 possible nodes Block 8 = 4 possible nodes Block 9 = 5 possible nodes Block 10 = 4 possible nodes Blocks 11, 12 = largest node Block 13 = appendix  SW 04/28/2022     Final Diagnosis performed by Jaquita Folds, MD.   Electronically signed 04/29/2022 Technical and / or Professional components performed at [facility], [place] [address], [place], [place] [address].  Immunohistochemistry Technical component (if applicable) was performed at [facility]. [address] [place], [place], [place] [address].   IMMUNOHISTOCHEMISTRY DISCLAIMER (if applicable): Some of these immunohistochemical stains may have been developed and the performance characteristics determine by [facility]. Some may not have been cleared or approved by the U.S. Food and Drug Administration. The FDA has determined that such clearance or approval is not necessary. This test is used for clinical purposes. It should  not be regarded as investigational or for research. This laboratory is certified under the [place] (CLIA-88) as qualified to perform high complexity clinical laboratory testing.  The controls stained appropriately.   Cultures: Recent Results (from the past 720 hour(s))  Surgical pcr screen     Status: Abnormal   Collection Time: 04/27/22  1:16 AM   Specimen: Nasal Mucosa; Nasal Swab  Result Value Ref Range Status   MRSA, PCR NEGATIVE NEGATIVE Final   Staphylococcus aureus POSITIVE (A) NEGATIVE Final    Comment: (NOTE) The Xpert SA Assay (FDA approved for NASAL specimens in patients 31 years of age and older), is one component of a comprehensive surveillance program. It is not intended to diagnose infection nor to guide or monitor treatment. Performed at [facility], [place] [address] [place],  [place] [address]     Labs: Results for orders placed or performed during the hospital encounter of 04/25/22 (from the past 48 hour(s))  Glucose, capillary     Status: Abnormal   Collection Time: 04/30/22 11:40 AM  Result Value Ref Range   Glucose-Capillary 189 (H) 70 - 99 mg/dL    Comment: Glucose reference range applies only to samples taken after fasting for at least 8 hours.  CEA     Status: None   Collection Time: 04/30/22  1:48 PM  Result Value Ref Range   CEA 0.9 0.0 - 4.7 ng/mL    Comment: (NOTE)                             Nonsmokers          <3.9                             Smokers             <5.6 [employer] Electrochemiluminescence Immunoassay (ECLIA) Values obtained with different assay methods or kits cannot be used interchangeably.  Results cannot be interpreted as absolute evidence of the presence or absence of malignant disease. Performed At: [facility] [place], [place] [id number] Rush Farmer MD [id number]   Glucose, capillary     Status: Abnormal   Collection Time:  04/30/22  4:52 PM  Result Value Ref Range   Glucose-Capillary 171 (H) 70 - 99 mg/dL    Comment: Glucose reference range applies only to samples taken after fasting for at least 8 hours.  Glucose, capillary     Status: Abnormal   Collection Time: 04/30/22  9:36 PM  Result Value Ref Range   Glucose-Capillary 153 (H) 70 - 99 mg/dL    Comment: Glucose reference range applies only to samples taken after fasting for at least 8 hours.  Comprehensive metabolic panel     Status: Abnormal   Collection Time: 05/01/22  4:58 AM  Result Value Ref Range   Sodium 134 (L) 135 - 145 mmol/L   Potassium 4.4 3.5 - 5.1 mmol/L   Chloride 104 98 - 111 mmol/L   CO2 23 22 - 32 mmol/L   Glucose, Bld 165 (H) 70 - 99 mg/dL    Comment: Glucose reference range applies only to samples taken after fasting for at least 8 hours.   BUN 20 8 - 23 mg/dL   Creatinine, Ser 1.05 0.61 - 1.24 mg/dL   Calcium 8.0 (L) 8.9 - 10.3 mg/dL   Total Protein 5.8 (L) 6.5 - 8.1 g/dL   Albumin 2.6 (L) 3.5 - 5.0 g/dL   AST 14 (L) 15 - 41 U/L   ALT 15 0 - 44 U/L   Alkaline Phosphatase 49 38 - 126 U/L   Total Bilirubin 2.0 (H) 0.3 - 1.2 mg/dL   GFR, Estimated >60 >60 mL/min    Comment: (NOTE) Calculated using the CKD-EPI Creatinine Equation (2021)    Anion gap 7 5 - 15    Comment: Performed at [facility], [place] [address] [place], [place] [address]  CBC     Status: Abnormal   Collection Time: 05/01/22  4:58 AM  Result Value Ref Range   WBC 5.7 4.0 - 10.5 K/uL   RBC 4.37 4.22 - 5.81 MIL/uL   Hemoglobin 12.3 (L) 13.0 - 17.0 g/dL   HCT 38.5 (L)  39.0 - 52.0 %   MCV 88.1 80.0 - 100.0 fL   MCH 28.1 26.0 - 34.0 pg   MCHC 31.9 30.0 - 36.0 g/dL   RDW 13.2 11.5 - 15.5 %   Platelets 210 150 - 400 K/uL   nRBC 0.0 0.0 - 0.2 %    Comment: Performed at [facility], [place] [address] [place], [place] [address]  Magnesium     Status: None   Collection Time: 05/01/22  4:58 AM  Result Value Ref Range    Magnesium 2.1 1.7 - 2.4 mg/dL    Comment: Performed at [facility], [place] [address] [place], [place] [address]  Phosphorus     Status: None   Collection Time: 05/01/22  4:58 AM  Result Value Ref Range   Phosphorus 2.8 2.5 - 4.6 mg/dL    Comment: Performed at [facility], [place] [address], [place], [place] [address]  Glucose, capillary     Status: Abnormal   Collection Time: 05/01/22  7:22 AM  Result Value Ref Range   Glucose-Capillary 159 (H) 70 - 99 mg/dL    Comment: Glucose reference range applies only to samples taken after fasting for at least 8 hours.  Glucose, capillary     Status: Abnormal   Collection Time: 05/01/22 12:08 PM  Result Value Ref Range   Glucose-Capillary 166 (H) 70 - 99 mg/dL    Comment: Glucose reference range applies only to samples taken after fasting for at least 8 hours.  Glucose, capillary     Status: Abnormal   Collection Time: 05/01/22  4:29 PM  Result Value Ref Range   Glucose-Capillary 167 (H) 70 - 99 mg/dL    Comment: Glucose reference range applies only to samples taken after fasting for at least 8 hours.  Glucose, capillary     Status: Abnormal   Collection Time: 05/01/22  9:23 PM  Result Value Ref Range   Glucose-Capillary 177 (H) 70 - 99 mg/dL    Comment: Glucose reference range applies only to samples taken after fasting for at least 8 hours.  Comprehensive metabolic panel     Status: Abnormal   Collection Time: 05/02/22  4:13 AM  Result Value Ref Range   Sodium 137 135 - 145 mmol/L   Potassium 3.9 3.5 - 5.1 mmol/L   Chloride 103 98 - 111 mmol/L   CO2 25 22 - 32 mmol/L   Glucose, Bld 167 (H) 70 - 99 mg/dL    Comment: Glucose reference range applies only to samples taken after fasting for at least 8 hours.   BUN 21 8 - 23 mg/dL   Creatinine, Ser 1.04 0.61 - 1.24 mg/dL   Calcium 8.2 (L) 8.9 - 10.3 mg/dL   Total Protein 5.8 (L) 6.5 - 8.1 g/dL   Albumin 2.8 (L) 3.5 - 5.0 g/dL   AST 22 15 - 41 U/L   ALT  22 0 - 44 U/L   Alkaline Phosphatase 70 38 - 126 U/L   Total Bilirubin 1.4 (H) 0.3 - 1.2 mg/dL   GFR, Estimated >60 >60 mL/min    Comment: (NOTE) Calculated using the CKD-EPI Creatinine Equation (2021)    Anion gap 9 5 - 15    Comment: Performed at [facility], [place] [address] [place], [place] [address]  Magnesium     Status: None   Collection Time: 05/02/22  4:13 AM  Result Value Ref Range   Magnesium 2.0 1.7 - 2.4 mg/dL  Comment: Performed at [facility], [place] [address] [place], [place] [address]  Glucose, capillary     Status: Abnormal   Collection Time: 05/02/22  7:45 AM  Result Value Ref Range   Glucose-Capillary 148 (H) 70 - 99 mg/dL    Comment: Glucose reference range applies only to samples taken after fasting for at least 8 hours.    Imaging / Studies: CT CHEST WO CONTRAST  Result Date: 04/30/2022 CLINICAL DATA:  Colon cancer, for staging EXAM: CT CHEST WITHOUT CONTRAST TECHNIQUE: Multidetector CT imaging of the chest was performed following the standard protocol without IV contrast. RADIATION DOSE REDUCTION: This exam was performed according to the departmental dose-optimization program which includes automated exposure control, adjustment of the mA and/or kV according to patient size and/or use of iterative reconstruction technique. COMPARISON:  CT abdomen/pelvis dated 04/25/2022 month FINDINGS: Cardiovascular: The heart is normal in size. Trace inferior pericardial fluid. No evidence of thoracic aortic aneurysm. Very mild atherosclerotic calcifications of the arch. Moderate three-coronary atherosclerosis. Mediastinum/Nodes: No suspicious mediastinal lymphadenopathy. Visualized thyroid is unremarkable. Lungs/Pleura: Evaluation of the lung parenchyma is constrained by respiratory motion. Within that constraint, there are no suspicious pulmonary nodules. Small bilateral pleural effusions. Mild scarring/atelectasis in the bilateral lower  lobes. No focal consolidation. No pneumothorax. Upper Abdomen: Visualized upper abdomen is notable for mildly dilated loops of small bowel in the central abdomen, likely reflecting adynamic postoperative ileus in the setting of recent surgery. Trace perihepatic ascites. No free air. Musculoskeletal: Degenerative changes of the visualized thoracolumbar spine. IMPRESSION: No evidence of metastatic disease in the chest. Small bilateral pleural effusions. Aortic Atherosclerosis (ICD10-I70.0). Electronically Signed   By: Julian Hy M.D.   On: 04/30/2022 17:09   DG Abd Portable 1V  Result Date: 04/30/2022 CLINICAL DATA:  Abdominal distension EXAM: PORTABLE ABDOMEN - 1 VIEW COMPARISON:  04/25/2022 FINDINGS: Interval removal of NG tube. Several dilated loops of small bowel in the mid abdomen measuring up to 5 cm. There is gas in the rectum. IMPRESSION: Dilated small bowel with gas the rectum. Differential includes early small bowel obstruction versus ileus. Electronically Signed   By: Suzy Bouchard M.D.   On: 04/30/2022 08:50    Medications / Allergies: per chart  Antibiotics: Anti-infectives (From admission, onward)    Start     Dose/Rate Route Frequency Ordered Stop   04/27/22 0645  cefoTEtan (CEFOTAN) 2 g in sodium chloride 0.9 % 100 mL IVPB        2 g 200 mL/hr over 30 Minutes Intravenous On call to O.R. 04/27/22 [id number] 04/27/22 0802         Note: Portions of this report may have been transcribed using voice recognition software. Every effort was made to ensure accuracy; however, inadvertent computerized transcription errors may be present.   Any transcriptional errors that result from this process are unintentional.    Adin Hector, MD, FACS, MASCRS Esophageal, Gastrointestinal & Colorectal Surgery Robotic and Minimally Invasive Surgery  [place]. [address] [place], [place] [address] [phone] Fax [phone] Main  CONTACT INFORMATION:  Weekday (9AM-5PM): Call [facility] main office at [phone]  Weeknight (5PM-9AM) or Weekend/Holiday: Check [email or web address] (password " [email or web address]") for General Surgery [facility] coverage  (Please, do not use SecureChat as it is not reliable communication to reach operating surgeons for immediate patient care given surgeries/outpatient duties/clinic/cross-coverage/off post-call which would lead to a delay in care.  Epic staff messaging available for outptient  concerns, but may not be answered for 48 hours or more).     05/02/2022  8:20 AM

## 2022-05-03 DIAGNOSIS — E1169 Type 2 diabetes mellitus with other specified complication: Secondary | ICD-10-CM | POA: Diagnosis not present

## 2022-05-03 DIAGNOSIS — K56609 Unspecified intestinal obstruction, unspecified as to partial versus complete obstruction: Secondary | ICD-10-CM | POA: Diagnosis not present

## 2022-05-03 DIAGNOSIS — G4733 Obstructive sleep apnea (adult) (pediatric): Secondary | ICD-10-CM | POA: Diagnosis not present

## 2022-05-03 DIAGNOSIS — E871 Hypo-osmolality and hyponatremia: Secondary | ICD-10-CM | POA: Diagnosis not present

## 2022-05-03 LAB — BASIC METABOLIC PANEL
Anion gap: 9 (ref 5–15)
BUN: 17 mg/dL (ref 8–23)
CO2: 25 mmol/L (ref 22–32)
Calcium: 8.2 mg/dL — ABNORMAL LOW (ref 8.9–10.3)
Chloride: 101 mmol/L (ref 98–111)
Creatinine, Ser: 0.95 mg/dL (ref 0.61–1.24)
GFR, Estimated: >60 mL/min (ref >60)
Glucose, Bld: 148 mg/dL — ABNORMAL HIGH (ref 70–99)
Potassium: 3.9 mmol/L (ref 3.5–5.1)
Sodium: 135 mmol/L (ref 135–145)

## 2022-05-03 LAB — GLUCOSE, CAPILLARY
Glucose-Capillary: 147 mg/dL — ABNORMAL HIGH (ref 70–99)
Glucose-Capillary: 221 mg/dL — ABNORMAL HIGH (ref 70–99)
Glucose-Capillary: 176 mg/dL — ABNORMAL HIGH (ref 70–99)

## 2022-05-03 LAB — MAGNESIUM: Magnesium: 1.9 mg/dL (ref 1.7–2.4)

## 2022-05-03 LAB — CBC
HCT: 36.2 % — ABNORMAL LOW (ref 39.0–52.0)
Hemoglobin: 11.8 g/dL — ABNORMAL LOW (ref 13.0–17.0)
MCH: 28.5 pg (ref 26.0–34.0)
MCHC: 32.6 g/dL (ref 30.0–36.0)
MCV: 87.4 fL (ref 80.0–100.0)
Platelets: 242 10*3/uL (ref 150–400)
RBC: 4.14 MIL/uL — ABNORMAL LOW (ref 4.22–5.81)
RDW: 13.2 % (ref 11.5–15.5)
WBC: 7 10*3/uL (ref 4.0–10.5)
nRBC: 0 % (ref 0.0–0.2)

## 2022-05-03 MED ORDER — METHOCARBAMOL 500 MG PO TABS
500.0000 mg | ORAL_TABLET | Freq: Three times a day (TID) | ORAL | Status: DC
  Administered 2022-05-03 – 2022-05-04 (×5): 500 mg via ORAL
  Filled 2022-05-03 (×5): qty 1

## 2022-05-03 NOTE — Care Management Important Message (Signed)
Important Message  Patient Details IM Letter given. Name: Noah Lane MRN: [id number] Date of Birth: 06-06-1955   Medicare Important Message Given:  Yes     Kerin Salen 05/03/2022, 1:39 PM

## 2022-05-03 NOTE — Progress Notes (Signed)
PROGRESS NOTE    Noah Lane  [id number] DOB: [birth year] DOA: 04/25/2022 PCP: [name], DO    Chief Complaint  Patient presents with   Abdominal Pain    Brief Narrative:  Patient pleasant 67 year old gentleman history of type 2 diabetes, hyperlipidemia, OSA on CPAP, obesity presented with abdominal pain and distention, right lower quadrant, no bowel movements on the day of admission. Patient underwent CT scan done concerning for near obstructing mass at the hepatic flexure worrisome for colon cancer.  Loss of stool proximal to this area.  No evidence of perforation.  Patient admitted.  General surgery consulted who recommended GI input.  Patient assessed by GI who reviewed films, patient noted to have a high-grade colonic obstruction of the hepatic flexure concerning for malignancy with ascending colon dilatation, not a candidate for bowel prep due to high-grade obstruction with proximal colon dilation, no role for colonoscopy of stent and recommended surgical management. Patient subsequently underwent laparoscopic assisted right hemicolectomy with ileocolic reanastomosis by general surgery on 04/27/2022.   Assessment & Plan:   Principal Problem:   Colonic obstruction (HCC) Active Problems:   Diabetes mellitus type 2 in obese (HCC)   OSA on CPAP   Obesity (BMI 30.0-34.9)   Hyponatremia   Hypokalemia   Hyperlipidemia   Gastroesophageal reflux disease   Ileus, postoperative (HCC)  #1 colonic obstruction at level of hepatic flexure suspicious for malignancy (colonic adenocarcinoma) -Patient had presented with right lower quadrant abdominal pain, no bowel movement, seen in the ED CT abdomen and pelvis done concerning for near obstructing mass at the hepatic flexure concerning for colon cancer. -Patient admitted, seen in consultation by general surgery who recommended evaluation by GI prior to any surgical input. -Patient seen in consultation by GI who reviewed  films and felt patient had a high-grade colonic obstruction at hepatic flexure concerning for malignancy and the ascending colonic dilatation, not a candidate for bowel prep due to high-grade obstruction with proximal colon dilatation, no role for colonoscopy or stenting regimen commended surgical management. -Patient subsequently underwent laparoscopic assisted right hemicolectomy with ileocolic reanastomosis per Dr. [name] 04/27/2022.  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 -General surgery ordered a staging chest CT(negative for metastatic disease in the chest) and baseline CEA ordered, and referral placed to medical oncology at discharge per general surgery. -Patient followed by general surgery, NG tube removed, patient placed on clear liquids which he tolerated and currently is on a dysphagia 1 diet per general surgery.   -Diet advanced to a soft diet which patient seems to be tolerating. -Initially with bowel movements however no bowel movement x 2 days, bowel movement have returned.   -Postop ileus improving.  -Keep potassium approximately 4, magnesium approximately 2. -Mobilize. -Per general surgery.   -2 probable postop ileus -Patient initially postoperatively with complaints of reflux symptoms, abdominal bloating, feels like trapped gas.  -Patient with clinical improvement initially, abdominal bloating improving, reflux symptoms improved, was having bowel movements however no bowel movements for 2 days however started to have bowel movements again.   -No nausea or emesis.  -Tolerating current diet. -Keep potassium approximately 4, magnesium approximately 2. -Patient advanced to a dysphagia 1 diet per general surgery.   -Mobilize.   -Per general surgery.   3.  Diabetes mellitus type 2 -Hemoglobin A1c 8.8 (04/22/2022) -CBG 147 this morning. -Continue to hold oral hypoglycemic agents.   -Continue Semglee 10 units daily, SSI.    4.   Hyperlipidemia -Continue to hold  statin. -May resume on discharge.  5.  GERD -Improved clinically.   -PPI.   -GI cocktail as needed.   6.  OSA -Noted to be on CPAP at home.  7.  Hypokalemia/hypophosphatemia -Status post IV KCl. -Repleted.  Potassium at 3.9, magnesium at 1.9.   8.  Hyponatremia -Improved, sodium at 135. -IV fluids discontinued.   -Repeat labs in the AM.      DVT prophylaxis: Lovenox Code Status: Full Family Communication: Updated patient.  No family at bedside.  Disposition: Home when clinically improved, tolerating oral intake, cleared by general surgery.  Status is: Inpatient Remains inpatient appropriate because: Severity of illness   Consultants:  General surgery: [name] 04/25/2022 General surgery: [name] Plan 04/26/2022 Wound care RN: [name] 04/26/2022  Procedures:  CT abdomen and pelvis 04/25/2022 Abdominal films 04/25/2022 Laparoscopic assisted right hemicolectomy with ileocolic reanastomosis per [name] 04/27/2022  Antimicrobials:  Anti-infectives (From admission, onward)    Start     Dose/Rate Route Frequency Ordered Stop   04/27/22 0645  cefoTEtan (CEFOTAN) 2 g in sodium chloride 0.9 % 100 mL IVPB        2 g 200 mL/hr over 30 Minutes Intravenous On call to O.R. 04/27/22 ED:[id number] 04/27/22 0802         Subjective: Patient on the telephone.  Denies any chest pain or shortness of breath.  Denies any reflux symptoms.  States his bowel started to move.  Passing flatus.  Overall feeling much better than he did.  Tolerating current diet.   Objective: Vitals:   05/02/22 1333 05/02/22 2047 05/03/22 0609 05/03/22 1132  BP: (!) 153/77 124/66 (!) 163/77 125/72  Pulse: (!) 59 (!) 56 (!) 50 74  Resp:  '20 16 17  '$ Temp: 98.3 F (36.8 C) 98.9 F (37.2 C) (!) 97.4 F (36.3 C) 98.8 F (37.1 C)  TempSrc: Oral Oral Oral Oral  SpO2: 98% 97% 98% 99%  Weight:      Height:        Intake/Output Summary (Last 24 hours) at 05/03/2022 1300 Last  data filed at 05/03/2022 1132 Gross per 24 hour  Intake 1678 ml  Output 1240 ml  Net 438 ml    Filed Weights   04/25/22 1145 04/27/22 0656  Weight: 117 kg 117 kg    Examination:  General exam: NAD. Respiratory system: CTAB.  No wheezes, no crackles, no rhonchi.  Fair air movement.  Speaking in full sentences.  Cardiovascular system: Regular rate rhythm no murmurs rubs or gallops.  No JVD.  No lower extremity edema.  Gastrointestinal system: Less distended, soft, no significant tenderness to palpation.  Decreased bowel sounds.  No rebound.  No guarding.  Central nervous system: Alert and oriented x 3.  Moving extremities spontaneously.  No focal neurological deficits.   Extremities: Symmetric 5 x 5 power. Skin: No rashes, lesions or ulcers Psychiatry: Judgement and insight appear normal. Mood & affect appropriate.     Data Reviewed: I have personally reviewed following labs and imaging studies  CBC: Recent Labs  Lab 04/28/22 0423 04/29/22 0416 04/30/22 0513 05/01/22 0458 05/03/22 0407  WBC 8.0 7.8 7.9 5.7 7.0  NEUTROABS  --  6.0 5.9  --   --   HGB 14.0 13.3 13.5 12.3* 11.8*  HCT 42.3 40.8 41.5 38.5* 36.2*  MCV 86.7 86.6 86.1 88.1 87.4  PLT 186 169 212 210 242     Basic Metabolic Panel: Recent Labs  Lab 04/29/22 0602 04/30/22 0513 05/01/22 0458 05/02/22  0413 05/03/22 0407  NA 129* 131* 134* 137 135  K 3.9 4.6 4.4 3.9 3.9  CL 95* 101 104 103 101  CO2 '26 22 23 25 25  '$ GLUCOSE 186* 184* 165* 167* 148*  BUN '20 19 20 21 17  '$ CREATININE 0.94 0.98 1.05 1.04 0.95  CALCIUM 7.7* 7.9* 8.0* 8.2* 8.2*  MG 2.3 2.1 2.1 2.0 1.9  PHOS  --  1.8* 2.8  --   --      GFR: Estimated Creatinine Clearance: 99.7 mL/min (by C-G formula based on SCr of 0.95 mg/dL).  Liver Function Tests: Recent Labs  Lab 04/28/22 0423 04/29/22 0602 04/30/22 0513 05/01/22 0458 05/02/22 0413  AST 15 18  --  14* 22  ALT 16 15  --  15 22  ALKPHOS 40 44  --  49 70  BILITOT 2.9* 4.7*  --  2.0*  1.4*  PROT 6.5 6.5  --  5.8* 5.8*  ALBUMIN 3.6 3.4* 2.9* 2.6* 2.8*     CBG: Recent Labs  Lab 05/02/22 1112 05/02/22 1611 05/02/22 2052 05/03/22 0723 05/03/22 1129  GLUCAP 162* 165* 147* 147* 221*      Recent Results (from the past 240 hour(s))  Surgical pcr screen     Status: Abnormal   Collection Time: 04/27/22  1:16 AM   Specimen: Nasal Mucosa; Nasal Swab  Result Value Ref Range Status   MRSA, PCR NEGATIVE NEGATIVE Final   Staphylococcus aureus POSITIVE (A) NEGATIVE Final    Comment: (NOTE) The Xpert SA Assay (FDA approved for NASAL specimens in patients 56 years of age and older), is one component of a comprehensive surveillance program. It is not intended to diagnose infection nor to guide or monitor treatment. Performed at [facility], [place] [address] [place], [place] [address]          Radiology Studies: No results found.      Scheduled Meds:  acetaminophen  1,000 mg Oral TID   enoxaparin (LOVENOX) injection  40 mg Subcutaneous Q24H   feeding supplement  237 mL Oral TID WC   insulin aspart  0-5 Units Subcutaneous QHS   insulin aspart  0-9 Units Subcutaneous TID WC   insulin glargine-yfgn  10 Units Subcutaneous Daily   lidocaine  1 patch Transdermal Q24H   lidocaine  15 mL Oral Once   lip balm   Topical BID   methocarbamol  500 mg Oral TID   metoprolol tartrate  5 mg Intravenous Q6H   pantoprazole (PROTONIX) IV  40 mg Intravenous Q24H   polycarbophil  625 mg Oral BID   sodium chloride flush  3 mL Intravenous Q12H   Continuous Infusions:  sodium chloride       LOS: 8 days    Time spent: 40 minutes    Irine Seal, MD [facility]   To contact the attending provider between 7A-7P or the covering provider during after hours 7P-7A, please log into the web site [email or web address] and access using universal [place] password for that web site. If you do not have the password, please call the hospital  operator.  05/03/2022, 1:00 PM

## 2022-05-03 NOTE — Progress Notes (Signed)
[facility] Progress Note  6 Days Post-Op  Subjective: CC:  Overall feels better. Had a BM this AM, passing flatus. Tolerating PO. Mobilizing.   Objective: Vital signs in last 24 hours: Temp:  [97.4 F (36.3 C)-98.9 F (37.2 C)] 97.4 F (36.3 C) (03/04 0609) Pulse Rate:  [50-59] 50 (03/04 0609) Resp:  [16-20] 16 (03/04 0609) BP: (124-163)/(66-77) 163/77 (03/04 0609) SpO2:  [97 %-98 %] 98 % (03/04 0609) FiO2 (%):  [21 %] 21 % (03/03 2100) Last BM Date : 05/03/22  Intake/Output from previous day: 03/03 0701 - 03/04 0700 In: 1318 [P.O.:1210; I.V.:3; IV Piggyback:105] Out: 1240 [Urine:1240] Intake/Output this shift: Total I/O In: 240 [P.O.:240] Out: -   PE: Gen:  Alert, NAD, cooperative  Card:  Regular rate and rhythm Pulm:  Normal effort non-labored on ORA  Abd: Soft, appropriately tender, moderate distention possibly increased compared to yesterday, incisions c/d/i Skin: warm and dry, no rashes  Psych: A&Ox3   Lab Results:  Recent Labs    05/01/22 0458 05/03/22 0407  WBC 5.7 7.0  HGB 12.3* 11.8*  HCT 38.5* 36.2*  PLT 210 242   BMET Recent Labs    05/02/22 0413 05/03/22 0407  NA 137 135  K 3.9 3.9  CL 103 101  CO2 25 25  GLUCOSE 167* 148*  BUN 21 17  CREATININE 1.04 0.95  CALCIUM 8.2* 8.2*   PT/INR No results for input(s): "LABPROT", "INR" in the last 72 hours. CMP     Component Value Date/Time   NA 135 05/03/2022 0407   K 3.9 05/03/2022 0407   CL 101 05/03/2022 0407   CO2 25 05/03/2022 0407   GLUCOSE 148 (H) 05/03/2022 0407   BUN 17 05/03/2022 0407   CREATININE 0.95 05/03/2022 0407   CREATININE 1.11 05/23/2020 1628   CALCIUM 8.2 (L) 05/03/2022 0407   PROT 5.8 (L) 05/02/2022 0413   ALBUMIN 2.8 (L) 05/02/2022 0413   AST 22 05/02/2022 0413   ALT 22 05/02/2022 0413   ALKPHOS 70 05/02/2022 0413   BILITOT 1.4 (H) 05/02/2022 0413   GFRNONAA >60 05/03/2022 0407   Lipase     Component Value Date/Time   LIPASE 30 04/25/2022 1223    Anti-infectives: Anti-infectives (From admission, onward)    Start     Dose/Rate Route Frequency Ordered Stop   04/27/22 0645  cefoTEtan (CEFOTAN) 2 g in sodium chloride 0.9 % 100 mL IVPB        2 g 200 mL/hr over 30 Minutes Intravenous On call to O.R. 04/27/22 0644 04/27/22 0802        Assessment/Plan Hepatic flexure mass causing LBO   POD#3 s/p Laparoscopic-assisted right hemicolectomy with ileocolic reanastomosis [id number] Dr. [name]  - path: pT3pN0 (0/18 LN) adenocarcinoma, CEA 3/1 was 0.9, no mets in the chest - AFVSS, WBC WNL, hgb stable - ileus resolving, advance to soft diet  - PT eval - no PT follow up - if does well with soft diet today I anticipate he would be stable for discharge as early as tomorrow from a surgical standpoint. Will need outpatient follow up with Dr. [name] and with oncology.   FEN: Soft, hypokalemia resolved  ID: perioperative cefotetan 2/27 VTE: SCD's,  lovenox Foley: removed 2/28, spont voids Dispo: med surg, await bowel function    OSA on CPAP DM2, uncontrolled, A1c 8.8%  HTN    LOS: 8 days   I reviewed nursing notes, hospitalist notes, last 24 h vitals and pain scores, last 48 h  intake and output, last 24 h labs and trends, and last 24 h imaging results.    Obie Dredge, PA-C [facility] Please see Amion for pager number during day hours 7:00am-4:30pm

## 2022-05-04 ENCOUNTER — Encounter: Payer: Self-pay | Admitting: *Deleted

## 2022-05-04 DIAGNOSIS — R030 Elevated blood-pressure reading, without diagnosis of hypertension: Secondary | ICD-10-CM

## 2022-05-04 DIAGNOSIS — K56609 Unspecified intestinal obstruction, unspecified as to partial versus complete obstruction: Secondary | ICD-10-CM | POA: Diagnosis not present

## 2022-05-04 DIAGNOSIS — C189 Malignant neoplasm of colon, unspecified: Secondary | ICD-10-CM | POA: Diagnosis not present

## 2022-05-04 DIAGNOSIS — K219 Gastro-esophageal reflux disease without esophagitis: Secondary | ICD-10-CM | POA: Diagnosis not present

## 2022-05-04 DIAGNOSIS — E1169 Type 2 diabetes mellitus with other specified complication: Secondary | ICD-10-CM | POA: Diagnosis not present

## 2022-05-04 LAB — CEA: CEA: 0.8 ng/mL (ref 0.0–4.7)

## 2022-05-04 LAB — GLUCOSE, CAPILLARY
Glucose-Capillary: 168 mg/dL — ABNORMAL HIGH (ref 70–99)
Glucose-Capillary: 229 mg/dL — ABNORMAL HIGH (ref 70–99)

## 2022-05-04 LAB — BASIC METABOLIC PANEL
Anion gap: 6 (ref 5–15)
BUN: 23 mg/dL (ref 8–23)
CO2: 24 mmol/L (ref 22–32)
Calcium: 7.9 mg/dL — ABNORMAL LOW (ref 8.9–10.3)
Chloride: 103 mmol/L (ref 98–111)
Creatinine, Ser: 1.02 mg/dL (ref 0.61–1.24)
GFR, Estimated: >60 mL/min (ref >60)
Glucose, Bld: 189 mg/dL — ABNORMAL HIGH (ref 70–99)
Potassium: 3.8 mmol/L (ref 3.5–5.1)
Sodium: 133 mmol/L — ABNORMAL LOW (ref 135–145)

## 2022-05-04 LAB — MAGNESIUM: Magnesium: 1.7 mg/dL (ref 1.7–2.4)

## 2022-05-04 LAB — SURGICAL PATHOLOGY

## 2022-05-04 MED ORDER — AMLODIPINE BESYLATE 5 MG PO TABS: 5.0000 mg | ORAL_TABLET | Freq: Every day | ORAL | 1 refills | Status: DC

## 2022-05-04 MED ORDER — PANTOPRAZOLE SODIUM 40 MG PO TBEC: 40.0000 mg | DELAYED_RELEASE_TABLET | Freq: Every day | ORAL | 1 refills | Status: DC

## 2022-05-04 MED ORDER — AMLODIPINE BESYLATE 5 MG PO TABS
5.0000 mg | ORAL_TABLET | Freq: Every day | ORAL | Status: DC
  Administered 2022-05-04: 5 mg via ORAL
  Filled 2022-05-04: qty 1

## 2022-05-04 MED ORDER — ENSURE ENLIVE PO LIQD: 237.0000 mL | Freq: Three times a day (TID) | ORAL | 12 refills | Status: DC

## 2022-05-04 MED ORDER — METHOCARBAMOL 500 MG PO TABS: 500.0000 mg | ORAL_TABLET | Freq: Three times a day (TID) | ORAL | 0 refills | Status: DC | PRN

## 2022-05-04 MED ORDER — ACETAMINOPHEN 500 MG PO TABS: 1000.0000 mg | ORAL_TABLET | Freq: Four times a day (QID) | ORAL | 0 refills | Status: DC | PRN

## 2022-05-04 MED ORDER — CALCIUM POLYCARBOPHIL 625 MG PO TABS: 625.0000 mg | ORAL_TABLET | Freq: Two times a day (BID) | ORAL | Status: DC

## 2022-05-04 NOTE — Discharge Summary (Signed)
Physician Discharge Summary  Noah Lane [id number] DOB: [birth year] DOA: 04/25/2022  PCP: [name], DO  Admit date: 04/25/2022 Discharge date: 05/04/2022  Time spent: 60 minutes  Recommendations for Outpatient Follow-up:  Follow-up with Dr. [name], general surgery on 05/25/2022 at 10:50 AM. Follow-up with [name], DO in 2 weeks.  On follow-up patient will need there renal panel to follow-up on electrolytes, renal function, phosphorus level.  Patient will also need a magnesium level drawn.  Patient's diabetes will need to be followed up upon.  Patient's blood pressure need to be reassessed as patient started on Norvasc 5 mg daily during this hospitalization. Follow-up with Dr. Marin Olp, medical oncology as scheduled 05/14/2022 at 10:45 AM.   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  Diet recommendation: Carb modified diet  Filed Weights   04/25/22 1145 04/27/22 0656  Weight: 117 kg 117 kg    History of present illness:  HPI per Dr. Erik Obey Fryling is a 67 y.o. male with medical history significant of T2DM, HLD, OSA on CPAP, and obesity who presents as a transfer from outside ED for colonic obstruction requiring GI and general surgery consultation.    Abdominal pain started midnight to right lower quadrant. Tried to vomit and have bowel movement without relieve and decided to go to ED. Has hx of umbilical and inguinal hernia years ago. No weight loss but has actually gained weight due to diet. Had colonoscopy probably 10 years ago in [place] and reports benign polyps that were removed.  No tobacco use. No  family hx of colon cancer.   In the ED, he was afebrile and normotensive BP elevated up to 185/85.   Mild leukocytosis of 11.5, hemoglobin of 15.   Mild hyponatremia of 133, K of 4, creatinine 1.05, CBG of 182.   Colonic obstruction at the level of the hepatic flexure suspicious for colonic neoplasm.  There is also signs of colitis associated with obstruction with marked distention of the cecum.  Ascending colon dilated to a centimeter and associated with surrounding stranding and trace fluid.  Findings concerning for stercoral colitis associated with colonic obstruction.  Colon may be at risk for perforation.   EDP consulted general surgery Dr. Marlou Starks and he will see in consultation. Likely will need GI consultation as well. NG tube was placed.    Hospital Course:  #1 colonic obstruction at level of hepatic flexure suspicious for malignancy (colonic adenocarcinoma) -Patient had presented with right lower quadrant abdominal pain, no bowel movement, seen in the ED CT abdomen and pelvis done concerning for near obstructing mass at the hepatic flexure concerning for colon cancer. -Patient admitted, seen in consultation by general surgery who recommended evaluation by GI prior to any surgical input. -Patient seen in consultation by GI who reviewed films and felt patient had a high-grade colonic obstruction at hepatic flexure concerning for malignancy and the ascending colonic dilatation, not a candidate for bowel prep due to high-grade obstruction with proximal colon dilatation, no role for colonoscopy or stenting regimen commended surgical management. -Patient subsequently underwent laparoscopic assisted right hemicolectomy with ileocolic reanastomosis per Dr. [name] 04/27/2022.  Biopsies  consistent with adenocarcinoma of the colon with negative margins. -General surgery ordered a staging chest CT(negative for metastatic disease in the chest) and baseline CEA noted at 0.8.   -General surgery placed a  referral to medical oncology at discharge.  -Patient followed by general surgery, NG tube removed, patient placed on clear liquids which he tolerated and diet subsequently advanced to a dysphagia 1 diet and then to a soft diet which patient tolerated.   -Patient initially had a postop ileus which subsequently resolved.   -Electrolytes were repleted with goal potassium approximately 4 and magnesium approximately 2.   -Improved clinically, cleared by general surgery for discharge and patient will be discharged home in stable and improved condition.   -Outpatient follow-up with general surgery and medical oncology.     -2 probable postop ileus -Patient initially postoperatively with complaints of reflux symptoms, abdominal bloating, feels like trapped gas.  -Patient with clinical improvement initially, abdominal bloating improved, reflux symptoms controlled with PPI and GI cocktail. -Patient initially started to have bowel movements and then subsequently no bowel movements for 2 days and then bowels started to move again.   -Patient had no nausea or emesis, diet was advanced by general surgery which he tolerated.   -Electrolytes were repleted with goal potassium approximately 4 and magnesium approximately 2.   -Patient followed by general surgery during the hospitalization and cleared for discharge.   -Outpatient follow-up with general surgery.     3.  Diabetes mellitus type 2 -Hemoglobin A1c 8.8 (04/22/2022) -Patient's oral hypoglycemic agents were held during the hospitalization the patient maintained on long-acting Semglee 10 units daily as well as sliding scale insulin.   -Oral hypoglycemic agents will be resumed on discharge.    4.  Hyperlipidemia -Patient statin was held during the hospitalization will be resumed on discharge.    5.  GERD -Improved clinically.   -Patient maintained on PPI and GI cocktail as needed.   -Patient will be discharged home on PPI once daily with outpatient  follow-up with PCP.    6.  OSA -Noted to be on CPAP at home.   7.  Hypokalemia/hypophosphatemia -Repleted during the hospitalization.     8.  Hyponatremia -Improved, noted at 133 by day of discharge.   -Patient initially on IV fluids which was subsequently discontinued.   -Outpatient follow-up with PCP.  9.  Elevated blood pressure/?HTN -Patient noted to have elevated blood pressure during the hospitalization, while patient was n.p.o. for colonic obstruction and postop ileus patient maintained on IV Lopressor. -IV Lopressor subsequently discontinued. -Initial concern was pain may have been playing a role. -Patient started on Norvasc 5 mg daily in light of comorbidities of hyperlipidemia, diabetes. -Outpatient follow-up with PCP for further evaluation and management.  Procedures: CT abdomen and pelvis 04/25/2022 Abdominal films 04/25/2022 Laparoscopic assisted right hemicolectomy with ileocolic reanastomosis per Dr. [name] 04/27/2022  Consultations: General surgery: [name] 04/25/2022 General surgery: [name] Plan 04/26/2022 Wound care RN: [name] 04/26/2022  Discharge Exam: Vitals:   05/04/22 0648 05/04/22 1125  BP: (!) 155/75 (!) 162/75  Pulse: 62 78  Resp: 18 18  Temp: 97.9 F (36.6 C) 98.5 F (36.9 C)  SpO2: 100% 99%    General: NAD Cardiovascular: Regular rate rhythm no murmurs rubs or gallops.  No JVD.  No lower extremity edema. Respiratory: Clear to auscultation bilaterally.  No wheezes, no crackles, no rhonchi.  Fair air movement.  Speaking in full sentences.  Discharge Instructions   Discharge Instructions  Ambulatory referral to Hematology / Oncology   Complete by: As directed    Newly diagnosed colon cancer   Diet Carb Modified   Complete by: As directed    Increase activity slowly   Complete by: As directed       Allergies as of 05/04/2022   No Known Allergies      Medication List     TAKE these medications    acetaminophen 500 MG  tablet Commonly known as: TYLENOL Take 2 tablets (1,000 mg total) by mouth every 6 (six) hours as needed.   amLODipine 5 MG tablet Commonly known as: NORVASC Take 1 tablet (5 mg total) by mouth daily. Start taking on: May 05, 2022   atorvastatin 20 MG tablet Commonly known as: LIPITOR Take 1 tablet (20 mg total) by mouth daily.   feeding supplement Liqd Take 237 mLs by mouth 3 (three) times daily with meals.   metFORMIN 500 MG 24 hr tablet Commonly known as: GLUCOPHAGE-XR TAKE ONE TABLET BY MOUTH ONE TIME DAILY WITH BREAKFAST What changed: See the new instructions.   methocarbamol 500 MG tablet Commonly known as: ROBAXIN Take 1 tablet (500 mg total) by mouth every 8 (eight) hours as needed for muscle spasms.   nitroGLYCERIN 0.2 mg/hr patch Commonly known as: NITRODUR - Dosed in mg/24 hr Cut and apply 1/4 patch to most painful area q24h.   pantoprazole 40 MG tablet Commonly known as: Protonix Take 1 tablet (40 mg total) by mouth daily.   polycarbophil 625 MG tablet Commonly known as: FIBERCON Take 1 tablet (625 mg total) by mouth 2 (two) times daily.   PRESCRIPTION MEDICATION Inject 4 mLs as directed as needed (erectile disfunction). prostaglandin               Durable Medical Equipment  (From admission, onward)           Start     Ordered   05/02/22 1842  For home use only DME Walker rolling  Once       Question Answer Comment  Walker: With [place] Wheels   Patient needs a walker to treat with the following condition Debility      05/02/22 1841           No Known Allergies  Follow-up Information     Dwan Bolt, MD. Go on 05/25/2022.   Specialty: General Surgery Why: at 10:50 AM for post-operative follow up, please arrive 30 minutes early. Contact information: [address] [place] [address] [phone]         [name], DO. Schedule an appointment as soon as possible for a visit in 2 week(s).    Specialty: Family Medicine Contact information: [address] [place] [address] [phone]         Volanda Napoleon, MD Follow up on 05/14/2022.   Specialty: Oncology Why: f/u at 1045 am as scheduled Contact information: [place] [address] [phone]                  The results of significant diagnostics from this hospitalization (including imaging, microbiology, ancillary and laboratory) are listed below for reference.    Significant Diagnostic Studies: CT CHEST WO CONTRAST  Result Date: 04/30/2022 CLINICAL DATA:  Colon cancer, for staging EXAM: CT CHEST WITHOUT CONTRAST TECHNIQUE: Multidetector CT imaging of the chest was performed following the standard protocol without IV contrast. RADIATION DOSE REDUCTION:  This exam was performed according to the departmental dose-optimization program which includes automated exposure control, adjustment of the mA and/or kV according to patient size and/or use of iterative reconstruction technique. COMPARISON:  CT abdomen/pelvis dated 04/25/2022 month FINDINGS: Cardiovascular: The heart is normal in size. Trace inferior pericardial fluid. No evidence of thoracic aortic aneurysm. Very mild atherosclerotic calcifications of the arch. Moderate three-coronary atherosclerosis. Mediastinum/Nodes: No suspicious mediastinal lymphadenopathy. Visualized thyroid is unremarkable. Lungs/Pleura: Evaluation of the lung parenchyma is constrained by respiratory motion. Within that constraint, there are no suspicious pulmonary nodules. Small bilateral pleural effusions. Mild scarring/atelectasis in the bilateral lower lobes. No focal consolidation. No pneumothorax. Upper Abdomen: Visualized upper abdomen is notable for mildly dilated loops of small bowel in the central abdomen, likely reflecting adynamic postoperative ileus in the setting of recent surgery. Trace perihepatic ascites. No free air.  Musculoskeletal: Degenerative changes of the visualized thoracolumbar spine. IMPRESSION: No evidence of metastatic disease in the chest. Small bilateral pleural effusions. Aortic Atherosclerosis (ICD10-I70.0). Electronically Signed   By: Julian Hy M.D.   On: 04/30/2022 17:09   DG Abd Portable 1V  Result Date: 04/30/2022 CLINICAL DATA:  Abdominal distension EXAM: PORTABLE ABDOMEN - 1 VIEW COMPARISON:  04/25/2022 FINDINGS: Interval removal of NG tube. Several dilated loops of small bowel in the mid abdomen measuring up to 5 cm. There is gas in the rectum. IMPRESSION: Dilated small bowel with gas the rectum. Differential includes early small bowel obstruction versus ileus. Electronically Signed   By: Suzy Bouchard M.D.   On: 04/30/2022 08:50   DG Abd Portable 1V  Result Date: 04/25/2022 CLINICAL DATA:  NG tube placement. EXAM: PORTABLE ABDOMEN - 1 VIEW COMPARISON:  None Available. FINDINGS: An enteric tube is noted with tip overlying the distal stomach. A large amount of stool within the visualized proximal colon noted. IMPRESSION: Enteric tube with tip overlying the distal stomach. Electronically Signed   By: Margarette Canada M.D.   On: 04/25/2022 15:08   CT ABDOMEN PELVIS W CONTRAST  Result Date: 04/25/2022 CLINICAL DATA:  Abdominal pain. * Tracking Code: BO * EXAM: CT ABDOMEN AND PELVIS WITH CONTRAST TECHNIQUE: Multidetector CT imaging of the abdomen and pelvis was performed using the standard protocol following bolus administration of intravenous contrast. RADIATION DOSE REDUCTION: This exam was performed according to the departmental dose-optimization program which includes automated exposure control, adjustment of the mA and/or kV according to patient size and/or use of iterative reconstruction technique. CONTRAST:  139m OMNIPAQUE IOHEXOL 300 MG/ML  SOLN COMPARISON:  No prior abdominal comparison is are available. FINDINGS: Lower chest: Calcified coronary artery disease of LEFT and RIGHT  coronary circulation. No sign of effusion. No sign of consolidative change. Hepatobiliary: No focal, suspicious hepatic lesion. No pericholecystic stranding. No biliary duct dilation. Portal vein is patent. Hepatic steatosis. Pancreas: Normal, without mass, inflammation or ductal dilatation. Spleen: Normal. Adrenals/Urinary Tract: Adrenal glands are normal. Symmetric renal enhancement without hydronephrosis or perinephric stranding. No perivesical stranding. No suspicious renal lesion. Bosniak category II cyst arises from the interpolar RIGHT kidney measuring approximately 1.6 cm for which no additional dedicated imaging follow-up is recommended. Stomach/Bowel: No sign of acute process related to the stomach or proximal small bowel. No sign of small bowel obstruction. Irregular enhancement of thickening of the hepatic flexure with proximal colonic dilation pericolonic stranding. Ascending colon is filled with feces. Normal appendix. Eccentric wall thickening at the site of transition up to 2 cm is suggested. Vascular/Lymphatic: Aortic atherosclerosis. No sign of aneurysm. Smooth contour  of the IVC. There is no gastrohepatic or hepatoduodenal ligament lymphadenopathy. No retroperitoneal or mesenteric lymphadenopathy. No pelvic sidewall lymphadenopathy. Reproductive: Heterogeneous prostate, nonspecific. Unremarkable CT appearance of reproductive structures otherwise to the extent evaluated. Other: Small fat containing LEFT inguinal hernia. Post RIGHT and LEFT inguinal herniorrhaphy. This may represent residual fat in the LEFT inguinal canal and should be correlated with physical exam. There is trace fluid about the RIGHT colon Musculoskeletal: No acute bone finding. No destructive bone process. Spinal degenerative changes. Degenerative changes about the symphysis pubis and bilateral hips. IMPRESSION: 1. Colonic obstruction at the level of the hepatic flexure suspicious for colonic neoplasm. GI and surgical  consultation is advised. Stricture is another differential consideration though the irregular appearance of the colon is highly concerning for neoplasm. 2. Signs of colitis associated with obstruction with marked distension of the cecum, filled with stool. Ascending colon dilated to 8 cm and associated with surrounding stranding and trace fluid. Findings concerning for stercoral colitis associated with colonic obstruction. Colon may be at risk for perforation. 3. Hepatic steatosis. 4. Post RIGHT and LEFT inguinal herniorrhaphy. This may represent residual fat in the LEFT inguinal canal and should be correlated with physical exam. 5. Small fat containing LEFT inguinal hernia. 6. Calcified coronary artery disease of LEFT and RIGHT coronary circulation. 7. Aortic atherosclerosis. Aortic Atherosclerosis (ICD10-I70.0). Electronically Signed   By: Zetta Bills M.D.   On: 04/25/2022 13:50    Microbiology: Recent Results (from the past 240 hour(s))  Surgical pcr screen     Status: Abnormal   Collection Time: 04/27/22  1:16 AM   Specimen: Nasal Mucosa; Nasal Swab  Result Value Ref Range Status   MRSA, PCR NEGATIVE NEGATIVE Final   Staphylococcus aureus POSITIVE (A) NEGATIVE Final    Comment: (NOTE) The Xpert SA Assay (FDA approved for NASAL specimens in patients 25 years of age and older), is one component of a comprehensive surveillance program. It is not intended to diagnose infection nor to guide or monitor treatment. Performed at [facility], [address] [place], [place] [address]      Labs: Basic Metabolic Panel: Recent Labs  Lab 04/30/22 0513 05/01/22 0458 05/02/22 0413 05/03/22 0407 05/04/22 0438  NA 131* 134* 137 135 133*  K 4.6 4.4 3.9 3.9 3.8  CL 101 104 103 101 103  CO2 '22 23 25 25 24  '$ GLUCOSE 184* 165* 167* 148* 189*  BUN '19 20 21 17 23  '$ CREATININE 0.98 1.05 1.04 0.95 1.02  CALCIUM 7.9* 8.0* 8.2* 8.2* 7.9*  MG 2.1 2.1 2.0 1.9 1.7  PHOS 1.8* 2.8  --    --   --    Liver Function Tests: Recent Labs  Lab 04/28/22 0423 04/29/22 0602 04/30/22 0513 05/01/22 0458 05/02/22 0413  AST 15 18  --  14* 22  ALT 16 15  --  15 22  ALKPHOS 40 44  --  49 70  BILITOT 2.9* 4.7*  --  2.0* 1.4*  PROT 6.5 6.5  --  5.8* 5.8*  ALBUMIN 3.6 3.4* 2.9* 2.6* 2.8*   No results for input(s): "LIPASE", "AMYLASE" in the last 168 hours. No results for input(s): "AMMONIA" in the last 168 hours. CBC: Recent Labs  Lab 04/28/22 0423 04/29/22 0416 04/30/22 0513 05/01/22 0458 05/03/22 0407  WBC 8.0 7.8 7.9 5.7 7.0  NEUTROABS  --  6.0 5.9  --   --   HGB 14.0 13.3 13.5 12.3* 11.8*  HCT 42.3 40.8 41.5 38.5* 36.2*  MCV 86.7 86.6 86.1 88.1 87.4  PLT 186 169 212 210 242   Cardiac Enzymes: No results for input(s): "CKTOTAL", "CKMB", "CKMBINDEX", "TROPONINI" in the last 168 hours. BNP: BNP (last 3 results) No results for input(s): "BNP" in the last 8760 hours.  ProBNP (last 3 results) No results for input(s): "PROBNP" in the last 8760 hours.  CBG: Recent Labs  Lab 05/03/22 0723 05/03/22 1129 05/03/22 1639 05/04/22 0720 05/04/22 1121  GLUCAP 147* 221* 176* 168* 229*       Signed:  Irine Seal MD.  [facility] 05/04/2022, 3:10 PM

## 2022-05-04 NOTE — Progress Notes (Signed)
[facility] Progress Note  7 Days Post-Op  Subjective: CC:  Feels better. Up walking in hallway. Reports BM x2 yesterday. Tolerating PO.   Objective: Vital signs in last 24 hours: Temp:  [97.9 F (36.6 C)-98.8 F (37.1 C)] 97.9 F (36.6 C) (03/05 0648) Pulse Rate:  [62-74] 62 (03/05 0648) Resp:  [17-18] 18 (03/05 0648) BP: (125-155)/(72-75) 155/75 (03/05 0648) SpO2:  [99 %-100 %] 100 % (03/05 0648) FiO2 (%):  [21 %] 21 % (03/04 2303) Last BM Date : 05/03/22  Intake/Output from previous day: 03/04 0701 - 03/05 0700 In: 840 [P.O.:840] Out: 750 [Urine:750] Intake/Output this shift: Total I/O In: -  Out: 325 [Urine:325]  PE: Gen:  Alert, NAD, pleasant Card:  Regular rate and rhythm Pulm:  Normal effort ORA Abd: Soft, appropriately tender, incision C/D/I  Skin: warm and dry, no rashes  Psych: A&Ox3   Lab Results:  Recent Labs    05/03/22 0407  WBC 7.0  HGB 11.8*  HCT 36.2*  PLT 242   BMET Recent Labs    05/02/22 0413 05/03/22 0407  NA 137 135  K 3.9 3.9  CL 103 101  CO2 25 25  GLUCOSE 167* 148*  BUN 21 17  CREATININE 1.04 0.95  CALCIUM 8.2* 8.2*   PT/INR No results for input(s): "LABPROT", "INR" in the last 72 hours. CMP     Component Value Date/Time   NA 135 05/03/2022 0407   K 3.9 05/03/2022 0407   CL 101 05/03/2022 0407   CO2 25 05/03/2022 0407   GLUCOSE 148 (H) 05/03/2022 0407   BUN 17 05/03/2022 0407   CREATININE 0.95 05/03/2022 0407   CREATININE 1.11 05/23/2020 1628   CALCIUM 8.2 (L) 05/03/2022 0407   PROT 5.8 (L) 05/02/2022 0413   ALBUMIN 2.8 (L) 05/02/2022 0413   AST 22 05/02/2022 0413   ALT 22 05/02/2022 0413   ALKPHOS 70 05/02/2022 0413   BILITOT 1.4 (H) 05/02/2022 0413   GFRNONAA >60 05/03/2022 0407   Lipase     Component Value Date/Time   LIPASE 30 04/25/2022 1223       Studies/Results: No results found.  Anti-infectives: Anti-infectives (From admission, onward)    Start     Dose/Rate Route Frequency  Ordered Stop   04/27/22 0645  cefoTEtan (CEFOTAN) 2 g in sodium chloride 0.9 % 100 mL IVPB        2 g 200 mL/hr over 30 Minutes Intravenous On call to O.R. 04/27/22 0644 04/27/22 0802        Assessment/Plan  Hepatic flexure mass causing LBO   POD#7 s/p Laparoscopic-assisted right hemicolectomy with ileocolic reanastomosis [id number] Dr. [name]  - path: pT3pN0 (0/18 LN) adenocarcinoma, CEA 3/1 was 0.9, no mets in the chest - AFVSS, WBC WNL, hgb stable - ileus resolved, tolerating PO - PT eval - no PT follow up - stable for discharge. Will need outpatient follow up with Dr. [name] and with oncology. D/C instructions provided and discussed with the patient.   FEN: Soft, hypokalemia resolved  ID: perioperative cefotetan 2/27 VTE: SCD's,  lovenox Foley: removed 2/28, spont voids Dispo: med surg, await bowel function      OSA on CPAP DM2, uncontrolled, A1c 8.8%  HTN    LOS: 9 days   I reviewed nursing notes, hospitalist notes, last 24 h vitals and pain scores, last 48 h intake and output, last 24 h labs and trends, and last 24 h imaging results.    Obie Dredge, PA-C [facility] Please see Amion for pager number during day hours 7:00am-4:30pm

## 2022-05-04 NOTE — Progress Notes (Signed)
Patient was given discharge instructions, and all questions were answered.  Patient was stable for discharge and was taken to the main exit by wheelchair. 

## 2022-05-04 NOTE — Progress Notes (Signed)
Patient is still hospitalized. Spoke to him about his appointments. Will send MyChart message with same information.   Reached out to Knute Neu to introduce myself as the office RN Navigator and explain our new patient process. Reviewed the reason for their referral and scheduled their new patient appointment along with labs. Provided address and directions to the office including call back phone number. Reviewed with patient any concerns they may have or any possible barriers to attending their appointment.   Informed patient about my role as a navigator and that I will meet with them prior to their New Patient appointment and more fully discuss what services I can provide. At this time patient has no further questions or needs.    Oncology Nurse Navigator Documentation     05/04/2022   12:30 PM  Oncology Nurse Navigator Flowsheets  Abnormal Finding Date 04/25/2022  Confirmed Diagnosis Date 04/27/2022  Diagnosis Status Additional Work Up  Phase of Treatment Surgery  Surgery Actual Start Date: 04/27/2022  Navigator Follow Up Date: 05/14/2022  Navigator Follow Up Reason: New Patient Appointment  Navigator Location [facility]  Referral Date to RadOnc/MedOnc 04/30/2022  Navigator Encounter Type Introductory Phone Call  Treatment Initiated Date 04/27/2022  Patient Visit Type MedOnc  Treatment Phase Active Tx  Barriers/Navigation Needs Coordination of Care;Education  Education Other  Interventions Coordination of Care;Education  Acuity Level 2-Minimal Needs (1-2 Barriers Identified)  Coordination of Care Appts  Education Method Verbal  Time Spent with Patient 30

## 2022-05-05 ENCOUNTER — Encounter: Payer: Self-pay | Admitting: *Deleted

## 2022-05-05 ENCOUNTER — Other Ambulatory Visit: Payer: Self-pay

## 2022-05-05 ENCOUNTER — Telehealth: Payer: Self-pay | Admitting: *Deleted

## 2022-05-05 NOTE — Transitions of Care (Post Inpatient/ED Visit) (Signed)
05/05/2022  Name: Noah Lane MRN: [id number] DOB: [birth year]  Today's TOC FU Call Status: Today's TOC FU Call Status:: Successful TOC FU Call Competed TOC FU Call Complete Date: 05/05/22  Transition Care Management Follow-up Telephone Call Date of Discharge: 05/04/22 Discharge Facility: [facility]) Type of Discharge: Inpatient Admission Primary Inpatient Discharge Diagnosis:: colonic obstruction in setting of colon CA with surgical hemicolectomy How have you been since you were released from the hospital?: Better Any questions or concerns?: Yes Patient Questions/Concerns:: "I have not yet received the walker they recommended for me and I think it is a good idea for me to have one; please do follow up and send me an e-mail to let me know what happens-- I know that I will ask Dr. Nani Ravens if I need to when I see him next week" Patient Questions/Concerns Addressed: Other: (called [employer] [facility] to follow up on requested DME-- could not verify order had been placed at time of hospitalization; [employer] confirmed that no order from hospital was received; I messaged PCP and requested new order on behalf of patient)  Items Reviewed: Did you receive and understand the discharge instructions provided?: Yes (thoroughly reviewed with patient today who verbalizes good understanding of same) Medications obtained and verified?: Yes (Medications Reviewed) (full medication review completed; no concerns or discrepancies identified; confirmed patient obtained/ is taking all newly Rx'd medications as instructed; self-manages medications and denies questions/ concerns around medications today) Any new allergies since your discharge?: No Dietary orders reviewed?: Yes Type of Diet Ordered:: low carb/ heart healthy Do you have support at home?: Yes People in Home: spouse Name of Support/Comfort Primary Source: patient reports he is essentially independent in self-care activities; spouse assisting as  indicated  Home Care and Equipment/Supplies: [place] Ordered?: No Any new equipment or medical supplies ordered?: Yes Name of Medical supply agency?: "I am not sure-- they told me they were going to order a walker for me, but I did not get one, and never heard another thing about it" Were you able to get the equipment/medical supplies?: No (care coordination outreach to [employer] [facility] and to PCP to facilitate/ follow up) Do you have any questions related to the use of the equipment/supplies?: No  Functional Questionnaire: Do you need assistance with bathing/showering or dressing?: No Do you need assistance with meal preparation?: No Do you need assistance with eating?: No Do you have difficulty maintaining continence: No Do you need assistance with getting out of bed/getting out of a chair/moving?: No Do you have difficulty managing or taking your medications?: No  Folllow up appointments reviewed: PCP Follow-up appointment confirmed?: Yes Date of PCP follow-up appointment?: 05/17/22 Follow-up Provider: PCP, Dr. Nani Ravens [facility] Follow-up appointment confirmed?: Yes Date of Specialist follow-up appointment?: 05/14/22 Follow-Up Specialty Provider:: oncology provider Do you need transportation to your follow-up appointment?: No Do you understand care options if your condition(s) worsen?: Yes-patient verbalized understanding  SDOH Interventions Today    Flowsheet Row Most Recent Value  SDOH Interventions   Food Insecurity Interventions Intervention Not Indicated  Transportation Interventions Intervention Not Indicated      TOC Interventions Today    Flowsheet Row Most Recent Value  TOC Interventions   TOC Interventions Discussed/Reviewed TOC Interventions Discussed, Contact DME company for patient use of equipment, Post op wound/incision care, S/S of infection      Interventions Today    Flowsheet Row Most Recent Value  Chronic Disease   Chronic  disease during today's visit Other  [  new diagnosis CA/ recent surgery]  General Interventions   General Interventions Discussed/Reviewed Doctor Visits, Communication with, General Interventions Discussed, Durable Medical Equipment (DME)  Doctor Visits Discussed/Reviewed PCP, Doctor Visits Discussed  Durable Medical Equipment (DME) Gilford Rile  PCP/Specialist Visits Compliance with follow-up visit  Communication with PCP/Specialists  [facility] company [employer]]  Nutrition Interventions   Nutrition Discussed/Reviewed Nutrition Discussed  Pharmacy Interventions   Pharmacy Dicussed/Reviewed Pharmacy Topics Discussed  [full medication review completed]      Oneta Rack, RN, BSN, CCRN Alumnus RN CM Care Coordination/ Transition of [place] Management [phone]: direct office

## 2022-05-05 NOTE — Progress Notes (Signed)
The proposed treatment discussed in conference is for discussion purpose only and is not a binding recommendation.  The patients have not been physically examined, or presented with their treatment options.  Therefore, final treatment plans cannot be decided.  

## 2022-05-07 ENCOUNTER — Encounter: Payer: Self-pay | Admitting: Family Medicine

## 2022-05-14 ENCOUNTER — Inpatient Hospital Stay (HOSPITAL_BASED_OUTPATIENT_CLINIC_OR_DEPARTMENT_OTHER): Payer: Medicare Other | Admitting: Hematology & Oncology

## 2022-05-14 ENCOUNTER — Encounter: Payer: Self-pay | Admitting: Hematology & Oncology

## 2022-05-14 ENCOUNTER — Inpatient Hospital Stay: Payer: Medicare Other | Attending: Hematology & Oncology

## 2022-05-14 VITALS — BP 144/66 | HR 57 | Temp 98.4°F | Resp 20 | Ht 73.0 in | Wt 260.0 lb

## 2022-05-14 DIAGNOSIS — C189 Malignant neoplasm of colon, unspecified: Secondary | ICD-10-CM | POA: Diagnosis present

## 2022-05-14 DIAGNOSIS — Z87891 Personal history of nicotine dependence: Secondary | ICD-10-CM | POA: Diagnosis not present

## 2022-05-14 DIAGNOSIS — K56609 Unspecified intestinal obstruction, unspecified as to partial versus complete obstruction: Secondary | ICD-10-CM | POA: Insufficient documentation

## 2022-05-14 LAB — CBC WITH DIFFERENTIAL (CANCER CENTER ONLY)
Abs Immature Granulocytes: 0.09 10*3/uL — ABNORMAL HIGH (ref 0.00–0.07)
Basophils Absolute: 0.1 10*3/uL (ref 0.0–0.1)
Basophils Relative: 1 %
Eosinophils Absolute: 0.5 10*3/uL (ref 0.0–0.5)
Eosinophils Relative: 5 %
HCT: 36 % — ABNORMAL LOW (ref 39.0–52.0)
Hemoglobin: 12 g/dL — ABNORMAL LOW (ref 13.0–17.0)
Immature Granulocytes: 1 %
Lymphocytes Relative: 15 %
Lymphs Abs: 1.5 10*3/uL (ref 0.7–4.0)
MCH: 27.9 pg (ref 26.0–34.0)
MCHC: 33.3 g/dL (ref 30.0–36.0)
MCV: 83.7 fL (ref 80.0–100.0)
Monocytes Absolute: 0.8 10*3/uL (ref 0.1–1.0)
Monocytes Relative: 8 %
Neutro Abs: 6.9 10*3/uL (ref 1.7–7.7)
Neutrophils Relative %: 70 %
Platelet Count: 291 10*3/uL (ref 150–400)
RBC: 4.3 MIL/uL (ref 4.22–5.81)
RDW: 12.9 % (ref 11.5–15.5)
WBC Count: 9.8 10*3/uL (ref 4.0–10.5)
nRBC: 0 % (ref 0.0–0.2)

## 2022-05-14 LAB — CMP (CANCER CENTER ONLY)
ALT: 14 U/L (ref 0–44)
AST: 10 U/L — ABNORMAL LOW (ref 15–41)
Albumin: 4.3 g/dL (ref 3.5–5.0)
Alkaline Phosphatase: 63 U/L (ref 38–126)
Anion gap: 10 (ref 5–15)
BUN: 22 mg/dL (ref 8–23)
CO2: 27 mmol/L (ref 22–32)
Calcium: 9.4 mg/dL (ref 8.9–10.3)
Chloride: 101 mmol/L (ref 98–111)
Creatinine: 1.03 mg/dL (ref 0.61–1.24)
GFR, Estimated: >60 mL/min (ref >60)
Glucose, Bld: 200 mg/dL — ABNORMAL HIGH (ref 70–99)
Potassium: 4.4 mmol/L (ref 3.5–5.1)
Sodium: 138 mmol/L (ref 135–145)
Total Bilirubin: 0.9 mg/dL (ref 0.3–1.2)
Total Protein: 6.8 g/dL (ref 6.5–8.1)

## 2022-05-14 LAB — CEA (IN HOUSE-CHCC): CEA (CHCC-In House): 1.08 ng/mL (ref 0.00–5.00)

## 2022-05-14 LAB — LACTATE DEHYDROGENASE: LDH: 172 U/L (ref 98–192)

## 2022-05-14 NOTE — Progress Notes (Signed)
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 04/28/22  : Had colon cancer.  HPI: Noah Lane is a very nice 67 year old white male.  He comes in with his wife.  He is originally from [place].  It was fun talking to him about [place].  He has been down in [place] for about 8 years.  He retired.  He currently is doing commercial real state.  He has been fairly healthy.  He does have diabetes.  He has had diabetes for about the 6 years.  He is on Glucophage for this.  He has been in good health.  However, he began to develop abdominal pain.  He had ulcers back in late February.  He said the pain started in the right lower quadrant.  He had no emesis.  He had no bowel movement.  He subsequently went to local emergency room.  He was found to have bowel obstruction.  He was then transferred over to [facility].  He was seen by gastroenterology.  He was seen by surgery.  Of note, he had a NG tube placed.  Gastroenterology did not feel that he would be able to do a colonoscopy.  As such, he was taken to surgery.  He was operated on by Dr. [name].  Surgery was on 04/27/2022.  The pathology report [id number]) showed a moderately differentiated adenocarcinoma in the right colon.  This was 5.5 cm.  All margins were negative.  The carcinoma invades into the pericolonic soft tissue.  There is no lymphovascular space invasion or perineural invasion.  18 lymph nodes were all negative.  There were no tumor deposits.  He had a low MSI and a proficient MMR.  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  The surgery was done laparoscopically.  He is feeling well.  He has had no problems with bowels or bladder.  When he came in to the hospital, he had scans done.  The CT scans  did not show any evidence of metastatic disease.  He had a CEA of 0.8.  There is no history of colon cancer in the family.  He has been quite healthy.  He has been pretty active.  Overall, I would say his performance status is probably ECOG 0.    Past Medical History:  Diagnosis Date   Diabetes mellitus type 2 in obese [facility])    Mixed hyperlipidemia   :   Past Surgical History:  Procedure Laterality Date   COLON RESECTION N/A 04/27/2022   Procedure: LAPAROSCOPIC HAND ASSISTED RIGHT HEMI COLECTOMY, POSSIBLE OPEN;  Surgeon: Dwan Bolt, MD;  Location: [facility];  Service: General;  Laterality: N/A;   HERNIA REPAIR  2012  :   Current Outpatient Medications:    acetaminophen (TYLENOL) 500 MG tablet, Take 2 tablets (1,000 mg total) by mouth every 6 (six) hours as needed., Disp: 30 tablet, Rfl: 0   amLODipine (NORVASC) 5 MG tablet, Take 1 tablet (5 mg total) by mouth daily., Disp: 30 tablet, Rfl: 1   atorvastatin (LIPITOR) 20 MG tablet, Take 1 tablet (20 mg total) by mouth daily., Disp: 90 tablet, Rfl: 3   feeding supplement (ENSURE ENLIVE / ENSURE PLUS) LIQD, Take 237 mLs by mouth 3 (  three) times daily with meals., Disp: 237 mL, Rfl: 12   metFORMIN (GLUCOPHAGE-XR) 500 MG 24 hr tablet, TAKE ONE TABLET BY MOUTH ONE TIME DAILY WITH BREAKFAST (Patient taking differently: Take 1,000 mg by mouth daily.), Disp: 90 tablet, Rfl: 0   pantoprazole (PROTONIX) 40 MG tablet, Take 1 tablet (40 mg total) by mouth daily., Disp: 30 tablet, Rfl: 1   polycarbophil (FIBERCON) 625 MG tablet, Take 1 tablet (625 mg total) by mouth 2 (two) times daily., Disp: , Rfl:    methocarbamol (ROBAXIN) 500 MG tablet, Take 1 tablet (500 mg total) by mouth every 8 (eight) hours as needed for muscle spasms. (Patient not taking: Reported on 05/14/2022), Disp: 40 tablet, Rfl: 0   nitroGLYCERIN (NITRODUR - DOSED IN MG/24 HR) 0.2 mg/hr patch, Cut and apply 1/4 patch to most painful area q24h. (Patient not taking: Reported on  05/14/2022), Disp: 30 patch, Rfl: 11   PRESCRIPTION MEDICATION, Inject 4 mLs as directed as needed (erectile disfunction). prostaglandin (Patient not taking: Reported on 05/14/2022), Disp: , Rfl: :  :  No Known Allergies:   Family History  Problem Relation Age of Onset   Heart disease Mother    Diabetes Father    Cancer Father        post age of 81  :   Social History   Socioeconomic History   Marital status: Married    Spouse name: Not on file   Number of children: Not on file   Years of education: Not on file   Highest education level: Not on file  Occupational History   Not on file  Tobacco Use   Smoking status: Former    Packs/day: 0.50    Years: 5.00    Additional pack years: 0.00    Total pack years: 2.50    Types: Cigarettes    Start date: 72    Quit date: 1993    Years since quitting: 31.2   Smokeless tobacco: Never  Vaping Use   Vaping Use: Never used  Substance and Sexual Activity   Alcohol use: Yes    Comment: occasional   Drug use: Never   Sexual activity: Not Currently  Other Topics Concern   Not on file  Social History Narrative   Not on file   Social Determinants of Health   Financial Resource Strain: Low Risk  (05/14/2022)   Overall Financial Resource Strain (CARDIA)    Difficulty of Paying Living Expenses: Not hard at all  Food Insecurity: No Food Insecurity (05/14/2022)   Hunger Vital Sign    Worried About Running Out of Food in the Last Year: Never true    [place] in the Last Year: Never true  Transportation Needs: No Transportation Needs (05/14/2022)   PRAPARE - Hydrologist (Medical): No    Lack of Transportation (Non-Medical): No  Physical Activity: Sufficiently Active (05/14/2022)   Exercise Vital Sign    Days of Exercise per Week: 7 days    Minutes of Exercise per Session: 30 min  Stress: Not on file  Social Connections: Socially Integrated (05/14/2022)   Social Connection and Isolation Panel  [NHANES]    Frequency of Communication with Friends and Family: More than three times a week    Frequency of Social Gatherings with Friends and Family: Not on file    Attends Religious Services: More than 4 times per year    Active Member of Clubs or Organizations: Yes    Attends  Club or Organization Meetings: 1 to 4 times per year    Marital Status: Married  Human resources officer Violence: Not At Risk (04/25/2022)   Humiliation, Afraid, Rape, and Kick questionnaire    Fear of Current or Ex-Partner: No    Emotionally Abused: No    Physically Abused: No    Sexually Abused: No  :  Review of Systems  Constitutional: Negative.   HENT: Negative.    Eyes: Negative.   Respiratory: Negative.    Cardiovascular: Negative.   Gastrointestinal: Negative.   Genitourinary: Negative.   Musculoskeletal: Negative.   Skin: Negative.   Neurological: Negative.   Endo/Heme/Allergies: Negative.   Psychiatric/Behavioral: Negative.       Exam: Vital signs show temperature of 98.4.  Pulse 57.  Blood pressure 144/66.  Weight is 260 pounds.  @IPVITALS @ Physical Exam Vitals reviewed.  HENT:     Head: Normocephalic and atraumatic.  Eyes:     Pupils: Pupils are equal, round, and reactive to light.  Cardiovascular:     Rate and Rhythm: Normal rate and regular rhythm.     Heart sounds: Normal heart sounds.  Pulmonary:     Effort: Pulmonary effort is normal.     Breath sounds: Normal breath sounds.  Abdominal:     General: Bowel sounds are normal.     Palpations: Abdomen is soft.     Comments: Abdominal exam is soft.  He has well healing laparoscopy scars.  There is no abdominal mass.  There is no fluid wave.  He has decent bowel sounds.  There is no palpable liver or spleen tip.  Musculoskeletal:        General: No tenderness or deformity. Normal range of motion.     Cervical back: Normal range of motion.  Lymphadenopathy:     Cervical: No cervical adenopathy.  Skin:    General: Skin is warm and  dry.     Findings: No erythema or rash.  Neurological:     Mental Status: He is alert and oriented to person, place, and time.  Psychiatric:        Behavior: Behavior normal.        Thought Content: Thought content normal.        Judgment: Judgment normal.     Recent Labs    05/14/22 1043  WBC 9.8  HGB 12.0*  HCT 36.0*  PLT 291    Recent Labs    05/14/22 1043  NA 138  K 4.4  CL 101  CO2 27  GLUCOSE 200*  BUN 22  CREATININE 1.03  CALCIUM 9.4    Blood smear review: None  Pathology: See above    Assessment and Plan: Noah Lane is a very nice 67 year old white male.  He has a good risk stage IIA colon cancer.  He has obstructive disease.  He had 18 lymph nodes that were all negative.  I think this is a very good prognostic factor.  At this point, I think that we just have to follow him along.  We probably will set him up with a CT scan when we see him back.  We probably get a CT scan in 4 months.  He probably does need to have a colonoscopy.  I will see about having Gastroenterology see him to have a colonoscopy.  He saw [name] Plan in the hospital.  Maybe [name] Plan can see him as an outpatient.  Again I think the chance of cure should be 80-85% just with surgery alone.  He is very nice.  It was fun talking to he and his wife.  I answered all their questions.  Will see him back in 4 months.  I will see him back, we will do our scans.

## 2022-05-17 ENCOUNTER — Encounter: Payer: Self-pay | Admitting: Family Medicine

## 2022-05-17 ENCOUNTER — Ambulatory Visit (INDEPENDENT_AMBULATORY_CARE_PROVIDER_SITE_OTHER): Payer: Medicare Other | Admitting: Family Medicine

## 2022-05-17 VITALS — BP 128/76 | HR 77 | Temp 98.8°F | Ht 73.0 in | Wt 249.1 lb

## 2022-05-17 DIAGNOSIS — R2689 Other abnormalities of gait and mobility: Secondary | ICD-10-CM | POA: Diagnosis not present

## 2022-05-17 DIAGNOSIS — R5381 Other malaise: Secondary | ICD-10-CM

## 2022-05-17 DIAGNOSIS — C189 Malignant neoplasm of colon, unspecified: Secondary | ICD-10-CM | POA: Diagnosis not present

## 2022-05-17 DIAGNOSIS — I10 Essential (primary) hypertension: Secondary | ICD-10-CM

## 2022-05-17 DIAGNOSIS — E1169 Type 2 diabetes mellitus with other specified complication: Secondary | ICD-10-CM | POA: Diagnosis not present

## 2022-05-17 DIAGNOSIS — Z7984 Long term (current) use of oral hypoglycemic drugs: Secondary | ICD-10-CM

## 2022-05-17 DIAGNOSIS — E669 Obesity, unspecified: Secondary | ICD-10-CM

## 2022-05-17 MED ORDER — OZEMPIC (0.25 OR 0.5 MG/DOSE) 2 MG/1.5ML ~~LOC~~ SOPN: PEN_INJECTOR | SUBCUTANEOUS | 1 refills | Status: DC

## 2022-05-17 MED ORDER — OLMESARTAN MEDOXOMIL 20 MG PO TABS: 20.0000 mg | ORAL_TABLET | Freq: Every day | ORAL | 2 refills | Status: DC

## 2022-05-17 NOTE — Progress Notes (Signed)
Chief Complaint  Patient presents with   Hospitalization Follow-up    HPI Noah Lane is a 67 y.o. y.o. male who presents for a transition of care visit.  Pt was discharged from [facility] on 05/04/22.  Within 48 business hours of discharge our office contacted pt via telephone to coordinate care and needs.   Pt was seen for abd pain and dx' w colon CA. he had a near obstructing mass at the hepatic flexure, underwent laparoscopic assisted right hemicolectomy with biopsies consistent with adenocarcinoma.  Negative margins.  The surgery took place on 04/27/2022.  Pain is better, still tender when he lays on his left side.  He saw the oncology team a few days ago and was told he would not need any further treatment.  He is mentally trying to cope with his new diagnosis.  He is requesting a referral to the counseling team.  Sugar and BP found to be high while in the hospital. He is compliant with his metformin XR 500 mg/d. Diet is getting better again. Starting to walk again trying to build his endurance up. He is getting lots of fatigue with exertion.  He is requesting a referral to the physical therapy team.  He was also placed on amlodipine for blood pressure control.  Past Medical History:  Diagnosis Date   Diabetes mellitus type 2 in obese [facility])    Mixed hyperlipidemia    Past Surgical History:  Procedure Laterality Date   COLON RESECTION N/A 04/27/2022   Procedure: LAPAROSCOPIC HAND ASSISTED RIGHT HEMI COLECTOMY, POSSIBLE OPEN;  Surgeon: Dwan Bolt, MD;  Location: [facility];  Service: General;  Laterality: N/A;   HERNIA REPAIR  2012   Family History  Problem Relation Age of Onset   Heart disease Mother    Diabetes Father    Cancer Father        post age of 43   Allergies as of 05/17/2022   No Known Allergies      Medication List        Accurate as of May 17, 2022  4:39 PM. If you have any questions, ask your nurse or doctor.          STOP taking these  medications    acetaminophen 500 MG tablet Commonly known as: TYLENOL Stopped by: [name], DO   amLODipine 5 MG tablet Commonly known as: NORVASC Stopped by: [name], DO       TAKE these medications    atorvastatin 20 MG tablet Commonly known as: LIPITOR Take 1 tablet (20 mg total) by mouth daily.   feeding supplement Liqd Take 237 mLs by mouth 3 (three) times daily with meals.   metFORMIN 500 MG 24 hr tablet Commonly known as: GLUCOPHAGE-XR TAKE ONE TABLET BY MOUTH ONE TIME DAILY WITH BREAKFAST What changed: See the new instructions.   methocarbamol 500 MG tablet Commonly known as: ROBAXIN Take 1 tablet (500 mg total) by mouth every 8 (eight) hours as needed for muscle spasms.   nitroGLYCERIN 0.2 mg/hr patch Commonly known as: NITRODUR - Dosed in mg/24 hr Cut and apply 1/4 patch to most painful area q24h.   olmesartan 20 MG tablet Commonly known as: BENICAR Take 1 tablet (20 mg total) by mouth daily. Started by: [name], DO   Ozempic (0.25 or 0.5 MG/DOSE) 2 MG/1.5ML Sopn Generic drug: Semaglutide(0.25 or 0.5MG /DOS) Inject 0.25 mg into the skin once a week for 28 days, THEN 0.5 mg once a  week for 28 days. Start taking on: May 17, 2022 Started by: [name], DO   pantoprazole 40 MG tablet Commonly known as: Protonix Take 1 tablet (40 mg total) by mouth daily.   polycarbophil 625 MG tablet Commonly known as: FIBERCON Take 1 tablet (625 mg total) by mouth 2 (two) times daily.   PRESCRIPTION MEDICATION Inject 4 mLs as directed as needed (erectile disfunction). prostaglandin        ROS:  Heart: No chest pain Lungs: No SOB, no cough Abd: As noted in HPI  Objective BP 128/76 (BP Location: Left Arm, Cuff Size: Large)   Pulse 77   Temp 98.8 F (37.1 C) (Oral)   Ht 6\' 1"  (1.854 m)   Wt 249 lb 2 oz (113 kg)   SpO2 98%   BMI 32.87 kg/m  General Appearance:  awake, alert, oriented, in no acute  distress and well developed, well nourished Skin:  there are no suspicious lesions or rashes of concern on exposed skin Lungs: Clear to auscultation.  No rales, rhonchi, or wheezing. Normal effort, no accessory muscle use. Heart:  Heart sounds are normal.  Regular rate and rhythm without murmur, gallop or rub. No bruits. Abdomen:  BS+, soft, NT, ND Musculoskeletal:  No muscle group atrophy or asymmetry Neurologic:  Alert and oriented x 3, gait is slow/cautious Psych exam: Nml mood and affect, age appropriate judgment and insight  Physical deconditioning - Plan: Ambulatory referral to Physical Therapy  Balance disorder - Plan: Ambulatory referral to Physical Therapy  Diabetes mellitus type 2 in obese ([place]) - Plan: Semaglutide,0.25 or 0.5MG /DOS, (OZEMPIC, 0.25 OR 0.5 MG/DOSE,) 2 MG/1.5ML SOPN, Magnesium, Phosphorus, Comprehensive metabolic panel  Colon adenocarcinoma ([place]) - Plan: Ambulatory referral to Psychology  Essential hypertension  1/2.  Will refer to physical therapy.  He is slow to return to prehospitalization physical status. 3.  Chronic, uncontrolled.  Start Ozempic 0.25 mg weekly for 4 weeks and then increase to 0.5 mg weekly.  Continue metformin XR 500 mg daily.  Counseled on diet and exercise.  Follow-up in a couple months to recheck this, goal A1c is less than 8. 4.  Counseling resources provided in AVS.  Will also place referral. 5.  He was placed on Norvasc in the hospital.  Oncology's blood work shows stable renal function.  Will exchange this with olmesartan 20 mg daily for better renal protection.  He will monitor blood pressure at home.  Will follow-up in 1 month if blood pressures not controlled.  Discharge summary and medication list have been reviewed/reconciled.  Labs pending at the time of discharge have been reviewed or are still pending at the time of this visit.  Follow-up labs and appointments have been ordered and/or coordinated appropriately. The patient voiced  understanding and agreement to the plan.  I spent 45 minutes with the patient discussing the above plans in addition to reviewing his chart/hospitalization on the same day of the visit.  [place], DO 05/17/22 4:39 PM

## 2022-05-17 NOTE — Patient Instructions (Addendum)
Give [place] 2-3 business days to get the results of your labs back.   Keep the diet clean and stay active.  If you do not hear anything about your referral in the next 1-2 weeks, call our office and ask for an update.  Aim to do some physical exertion for 150 minutes per week. This is typically divided into 5 days per week, 30 minutes per day. The activity should be enough to get your heart rate up. Anything is better than nothing if you have time constraints.  Please consider counseling. Contact [phone] to schedule an appointment or inquire about cost/insurance coverage.  [facility] located at [place], [place], [place].  Phone number = [phone].  Dr. Lennice Sites - Adult Psychiatry.    [facility] located at [place], [place], [place]. Phone number = [phone].   [facility] located at [address] [place], [place].  Phone number = [phone].   [facility] located at [place], [place], [place].  Phone number = [phone].  Let us know if you need anything.

## 2022-05-18 ENCOUNTER — Telehealth: Payer: Self-pay

## 2022-05-18 ENCOUNTER — Encounter: Payer: Self-pay | Admitting: *Deleted

## 2022-05-18 ENCOUNTER — Inpatient Hospital Stay: Payer: Medicare Other | Admitting: Licensed Clinical Social Worker

## 2022-05-18 DIAGNOSIS — C189 Malignant neoplasm of colon, unspecified: Secondary | ICD-10-CM

## 2022-05-18 NOTE — Progress Notes (Signed)
[place] Work  Clinical Social Work was referred by Art therapist for assessment of psychosocial needs.  Clinical Social Worker contacted patient by phone to offer support and assess for needs.     Patient stated he was doing well after his surgery.  He has a follow up appointment with his surgeon and is looking forward to hearing when he can resume physical activity.  He reports still adjusting to "all of this happening so quickly."  CSW provided contact information for further assistance if needed.  Currently, patient will not be requiring any treatment.     Margaree Mackintosh, LCSW  Clinical Social Worker [facility]

## 2022-05-18 NOTE — Progress Notes (Signed)
This navigator was out of the office for new patient appointment. Reviewed his visit. Patient was diagnosed while hospitalized and has surgery. He will now follow with observation and scans only. Since patient is on surveillance, will discontinue active navigation, but be available to the patient as needed in the future.   Oncology Nurse Navigator Documentation     05/18/2022    2:30 PM  Oncology Nurse Navigator Flowsheets  Navigation Complete Date: 05/18/2022  Post Navigation: Continue to Follow Patient? No  Reason Not Navigating Patient: No Treatment, Observation Only  Navigator Location [facility]  Navigator Encounter Type Appt/Treatment Plan Review  Patient Visit Type MedOnc  Treatment Phase Post-Tx Follow-up  Barriers/Navigation Needs No Barriers At This Time  Interventions Referrals  Acuity Level 1-No Barriers  Referrals Social Work  Time Spent with Patient 15

## 2022-05-18 NOTE — Telephone Encounter (Signed)
Scheduled patient for colonoscopy on 07/05/22 at 8:30am and pre-visit for 06/21/22 at 8:30 am.

## 2022-05-18 NOTE — Telephone Encounter (Signed)
-----   Message from Ladene Artist, MD sent at 05/14/2022  2:55 PM EDT ----- Regarding: RE: Noah Lane,  We will contact him to arrange a colonoscopy in late April or in May.   [place] is on a roll so we'll see what happens tonight.   Thanks,   Carlota Raspberry, Please arrange a colonoscopy in the [place]. Thanks, MS ----- Message ----- From: Volanda Napoleon, MD Sent: 05/14/2022   2:16 PM EDT To: Ladene Artist, MD  [name]: This patient you saw in the hospital in late February.  He has stage II colon cancer.  He cannot do a colonoscopy on him because of his obstruction.  He is doing well.  I was wonder if you could see him and do a colonoscopy as an outpatient.  I would think that April or May would be reasonable time to do a colonoscopy.  Thank so much for helping out.  [employer] will have a tough game tonight against .  However, I think that they will win.   Noah Lane

## 2022-05-21 ENCOUNTER — Encounter: Payer: Self-pay | Admitting: Family Medicine

## 2022-05-21 ENCOUNTER — Telehealth (HOSPITAL_BASED_OUTPATIENT_CLINIC_OR_DEPARTMENT_OTHER): Payer: Self-pay

## 2022-05-26 ENCOUNTER — Encounter: Payer: Self-pay | Admitting: Family Medicine

## 2022-05-27 ENCOUNTER — Encounter: Payer: Self-pay | Admitting: Family Medicine

## 2022-05-27 ENCOUNTER — Other Ambulatory Visit (HOSPITAL_COMMUNITY): Payer: Self-pay

## 2022-05-27 ENCOUNTER — Telehealth: Payer: Self-pay | Admitting: Family Medicine

## 2022-05-27 ENCOUNTER — Telehealth: Payer: Self-pay

## 2022-05-27 NOTE — Telephone Encounter (Signed)
Pt called to follow up on form we should have received for the ozempic. Pt also wants his provider to look into if there is an interaction between the blood pressure medicine and the prostaglandin injections he gets. He said the response he got was not really clear. Please call to advise.

## 2022-05-27 NOTE — Telephone Encounter (Signed)
PA approved earlier today.

## 2022-05-27 NOTE — Telephone Encounter (Signed)
PA initiated via Covermymeds; KEY: [id number]. Awaiting determination.

## 2022-05-27 NOTE — Telephone Encounter (Signed)
PA approved.   Approved. This drug has been approved under the Member's Medicare Part D benefit. Approved quantity: 1.5 units per 28 day(s). You may fill up to a 90 day supply except for those on Specialty Tier 5, which can be filled up to a 30 day supply. Please call the pharmacy to process the prescription claim. 

## 2022-05-31 ENCOUNTER — Other Ambulatory Visit (HOSPITAL_COMMUNITY): Payer: Self-pay

## 2022-05-31 MED ORDER — METFORMIN HCL ER 500 MG PO TB24: 500.0000 mg | ORAL_TABLET | Freq: Every day | ORAL | 0 refills | Status: DC

## 2022-05-31 NOTE — Telephone Encounter (Signed)
Received approval for ozempic today via [employer]. Patient is enrolled until 03/01/2023 and medication will be shipped to provider's office in 10-14 business days.

## 2022-05-31 NOTE — Telephone Encounter (Signed)
Called patient to discuss approval, he has some questions about the way/how quickly ozempic begins to take effect and mentioned that he experienced a slightly low blood sugar but has been "clean eating all day" and ate some crackers to bring blood sugar back up. I advised that as I am a Education administrator and not a pharmacist, I can't discuss that with him legally but would reach out to Dr. Irene Limbo office for them to contact him. Patient verbalized understanding and appreciation. Please call pt or have pharmacist call pt to discuss ozempic. Thank you!

## 2022-06-01 ENCOUNTER — Other Ambulatory Visit: Payer: Self-pay | Admitting: Family Medicine

## 2022-06-14 ENCOUNTER — Encounter: Payer: Self-pay | Admitting: *Deleted

## 2022-06-14 ENCOUNTER — Encounter: Payer: Self-pay | Admitting: Family Medicine

## 2022-06-15 ENCOUNTER — Telehealth: Payer: Self-pay | Admitting: Family Medicine

## 2022-06-15 NOTE — Telephone Encounter (Signed)
Called to inform Patient Assistance for Ozempic arrived at our office. Patient will drop by the office on Wednesday 06/16/2022 to pickup #5 boxes of Ozempic.

## 2022-06-21 ENCOUNTER — Telehealth: Payer: Self-pay

## 2022-06-21 ENCOUNTER — Ambulatory Visit (AMBULATORY_SURGERY_CENTER): Payer: Medicare Other

## 2022-06-21 VITALS — Ht 73.0 in | Wt 247.0 lb

## 2022-06-21 DIAGNOSIS — Z85038 Personal history of other malignant neoplasm of large intestine: Secondary | ICD-10-CM

## 2022-06-21 DIAGNOSIS — Z86010 Personal history of colonic polyps: Secondary | ICD-10-CM

## 2022-06-21 MED ORDER — PEG 3350-KCL-NA BICARB-NACL 420 G PO SOLR: 4000.0000 mL | Freq: Once | ORAL | 0 refills | Status: AC

## 2022-06-21 NOTE — Telephone Encounter (Signed)
Visit complete.

## 2022-06-21 NOTE — Progress Notes (Signed)
No egg or soy allergy known to patient  No issues known to pt with past sedation with any surgeries or procedures Patient denies ever being told they had issues or difficulty with intubation  No FH of Malignant Hyperthermia Pt is not on diet pills Pt is not on  home 02  Pt is not on blood thinners  Pt denies issues with constipation  No A fib or A flutter Have any cardiac testing pending--no Pt instructed to use Singlecare.com or GoodRx for a price reduction on prep  Patient's chart reviewed by Noah Lane CNRA prior to previsit and patient appropriate for the [facility].  Previsit completed and red dot placed by patient's name on their procedure day (on provider's schedule).    

## 2022-06-22 ENCOUNTER — Encounter: Payer: Self-pay | Admitting: Gastroenterology

## 2022-06-22 ENCOUNTER — Ambulatory Visit: Payer: Medicare Other | Admitting: Behavioral Health

## 2022-06-22 ENCOUNTER — Telehealth: Payer: Self-pay | Admitting: Gastroenterology

## 2022-06-22 NOTE — Telephone Encounter (Signed)
Good morning Dr. Russella Dar,  Please see previous message and advise if/how much of Dulcolax laxative oral solution that the patient would need to consume as the prep instructions say Dulcolax laxative pills- Thank you

## 2022-06-22 NOTE — Telephone Encounter (Signed)
Called patient and left message that per our protocol,  I could not send prep instructions through email but could send them through mychart.  Resent instructions at 930am.

## 2022-06-22 NOTE — Telephone Encounter (Signed)
Attempted to reach patient- unable to speak with patient- left message for patient with MD instructions- advised patient per voicemail that was left that he could also call back to the office at [phone] or send a MyChart message for any further needs;

## 2022-06-22 NOTE — Telephone Encounter (Signed)
OK to take and use the equivalent dose as the pill per prep instructions

## 2022-06-22 NOTE — Telephone Encounter (Signed)
Inbound call from patient in regards to prep instructions. Patient is inquiring if he can take liquid dulcolax instead of the pill form.  Please advise on MyChart if not able to reach on cell.

## 2022-06-28 NOTE — Telephone Encounter (Signed)
Instructed pt. To take olmesartan and liptor  as prescribe and only have stop iron or fiber supplements,all questions answered.

## 2022-06-28 NOTE — Telephone Encounter (Signed)
Patient is calling wanting to know about his Olmesartan and Lipitor if he needs to stop taking it. Also wondering about the supplements he takes green tea extract, garlic extract, alpha-lipoic acid, and polyclonal, not sure if he needs to stop taking those as well. Please advise

## 2022-07-05 ENCOUNTER — Ambulatory Visit (AMBULATORY_SURGERY_CENTER): Payer: Medicare Other | Admitting: Gastroenterology

## 2022-07-05 ENCOUNTER — Encounter: Payer: Self-pay | Admitting: Gastroenterology

## 2022-07-05 VITALS — BP 104/48 | HR 64 | Temp 98.0°F | Resp 15 | Ht 73.0 in | Wt 247.0 lb

## 2022-07-05 DIAGNOSIS — Z08 Encounter for follow-up examination after completed treatment for malignant neoplasm: Secondary | ICD-10-CM

## 2022-07-05 DIAGNOSIS — D122 Benign neoplasm of ascending colon: Secondary | ICD-10-CM | POA: Diagnosis not present

## 2022-07-05 DIAGNOSIS — Z86010 Personal history of colonic polyps: Secondary | ICD-10-CM

## 2022-07-05 DIAGNOSIS — Z85038 Personal history of other malignant neoplasm of large intestine: Secondary | ICD-10-CM

## 2022-07-05 MED ORDER — SODIUM CHLORIDE 0.9 % IV SOLN: 500.0000 mL | Freq: Once | INTRAVENOUS | Status: DC

## 2022-07-05 NOTE — Progress Notes (Signed)
Report to PACU, RN, vss, BBS= Clear.  

## 2022-07-05 NOTE — Op Note (Signed)
[facility] Patient Name: Noah Lane Procedure Date: 07/05/2022 8:21 AM MRN: [id number] Endoscopist: Meryl Dare , MD, [phone] Age: 67 Referring MD:  Date of Birth: October 25, 1955 Gender: Male Account #: [id number] Procedure:                Colonoscopy Indications:              High risk colon cancer surveillance: Personal                            history of colon cancer Medicines:                Monitored Anesthesia Care Procedure:                Pre-Anesthesia Assessment:                           - Prior to the procedure, a History and Physical                            was performed, and patient medications and                            allergies were reviewed. The patient's tolerance of                            previous anesthesia was also reviewed. The risks                            and benefits of the procedure and the sedation                            options and risks were discussed with the patient.                            All questions were answered, and informed consent                            was obtained. Prior Anticoagulants: The patient has                            taken no anticoagulant or antiplatelet agents. ASA                            Grade Assessment: II - A patient with mild systemic                            disease. After reviewing the risks and benefits,                            the patient was deemed in satisfactory condition to                            undergo the procedure.  After obtaining informed consent, the colonoscope                            was passed under direct vision. Throughout the                            procedure, the patient's blood pressure, pulse, and                            oxygen saturations were monitored continuously. The                            CF [id number] #[id number] was introduced through the anus                            and advanced to the the ileocolonic  anastomosis.                            The rectum was photographed. The quality of the                            bowel preparation was good. The colonoscopy was                            performed without difficulty. The patient tolerated                            the procedure well. Scope In: 8:27:18 AM Scope Out: 8:42:01 AM Scope Withdrawal Time: 0 hours 11 minutes 23 seconds  Total Procedure Duration: 0 hours 14 minutes 43 seconds  Findings:                 The perianal and digital rectal examinations were                            normal.                           There was evidence of a prior side-to-side                            ileo-colonic anastomosis in the ascending colon.                            This was patent and was characterized by healthy                            appearing mucosa. The anastomosis was not traversed.                           A 3 mm polyp was found in the ascending colon. The                            polyp was sessile. The polyp was removed with a  cold biopsy forceps. Resection and retrieval were                            complete.                           Two sessile polyps were found in the ascending                            colon. The polyps were 7 mm in size. These polyps                            were removed with a cold snare. Resection and                            retrieval were complete.                           Anal papilla(e) were hypertrophied.                           Internal hemorrhoids were found during                            retroflexion. The hemorrhoids were small and Grade                            I (internal hemorrhoids that do not prolapse).                           The exam was otherwise without abnormality on                            direct and retroflexion views. Complications:            No immediate complications. Estimated blood loss:                             None. Estimated Blood Loss:     Estimated blood loss: none. Impression:               - Patent side-to-side ileo-colonic anastomosis,                            characterized by healthy appearing mucosa.                           - One 3 mm polyp in the ascending colon, removed                            with a cold biopsy forceps. Resected and retrieved.                           - Two 7 mm polyps in the ascending colon, removed  with a cold snare. Resected and retrieved.                           - Anal papilla(e) were hypertrophied.                           - Internal hemorrhoids.                           - The examination was otherwise normal on direct                            and retroflexion views. Recommendation:           - Repeat colonoscopy, likely 2 years, after studies                            are complete for surveillance.                           - Patient has a contact number available for                            emergencies. The signs and symptoms of potential                            delayed complications were discussed with the                            patient. Return to normal activities tomorrow.                            Written discharge instructions were provided to the                            patient.                           - Resume previous diet.                           - Continue present medications.                           - Await pathology results. Meryl Dare, MD 07/05/2022 8:48:17 AM This report has been signed electronically.

## 2022-07-05 NOTE — Patient Instructions (Signed)
Await pathology results.  Continue present medications.  Handout on polyps and hemorrhoids provided.  YOU HAD AN ENDOSCOPIC PROCEDURE TODAY AT THE [facility]:   Refer to the procedure report that was given to you for any specific questions about what was found during the examination.  If the procedure report does not answer your questions, please call your gastroenterologist to clarify.  If you requested that your care partner not be given the details of your procedure findings, then the procedure report has been included in a sealed envelope for you to review at your convenience later.  YOU SHOULD EXPECT: Some feelings of bloating in the abdomen. Passage of more gas than usual.  Walking can help get rid of the air that was put into your GI tract during the procedure and reduce the bloating. If you had a lower endoscopy (such as a colonoscopy or flexible sigmoidoscopy) you may notice spotting of blood in your stool or on the toilet paper. If you underwent a bowel prep for your procedure, you may not have a normal bowel movement for a few days.  Please Note:  You might notice some irritation and congestion in your nose or some drainage.  This is from the oxygen used during your procedure.  There is no need for concern and it should clear up in a day or so.  SYMPTOMS TO REPORT IMMEDIATELY:  Following lower endoscopy (colonoscopy or flexible sigmoidoscopy):  Excessive amounts of blood in the stool  Significant tenderness or worsening of abdominal pains  Swelling of the abdomen that is new, acute  Fever of 100F or higher  For urgent or emergent issues, a gastroenterologist can be reached at any hour by calling [phone]. Do not use MyChart messaging for urgent concerns.    DIET:  We do recommend a small meal at first, but then you may proceed to your regular diet.  Drink plenty of fluids but you should avoid alcoholic beverages for 24 hours.  ACTIVITY:  You should plan to  take it easy for the rest of today and you should NOT DRIVE or use heavy machinery until tomorrow (because of the sedation medicines used during the test).    FOLLOW UP: Our staff will call the number listed on your records the next business day following your procedure.  We will call around 7:15- 8:00 am to check on you and address any questions or concerns that you may have regarding the information given to you following your procedure. If we do not reach you, we will leave a message.     If any biopsies were taken you will be contacted by phone or by letter within the next 1-3 weeks.  Please call us at [phone] if you have not heard about the biopsies in 3 weeks.    SIGNATURES/CONFIDENTIALITY: You and/or your care partner have signed paperwork which will be entered into your electronic medical record.  These signatures attest to the fact that that the information above on your After Visit Summary has been reviewed and is understood.  Full responsibility of the confidentiality of this discharge information lies with you and/or your care-partner.

## 2022-07-05 NOTE — Progress Notes (Signed)
Called to room to assist during endoscopic procedure.  Patient ID and intended procedure confirmed with present staff. Received instructions for my participation in the procedure from the performing physician.  

## 2022-07-05 NOTE — Progress Notes (Signed)
History & Physical  Primary Care Physician:  Sharlene Dory, DO Primary Gastroenterologist: Claudette Head, MD  Impression / Plan:  Personal history of colon cancer presenting with obstruction, S/P right hemicolectomy in February 2024 for colonoscopy.  T3 N0.  CHIEF COMPLAINT:  Personal history of colon cancer  HPI: Noah Lane is a 67 y.o. male with a personal history of colon cancer presenting with obstruction, S/P right hemicolectomy in February 2024 for colonoscopy.  T3 N0.   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 Authorizing Provider  atorvastatin (LIPITOR) 20 MG tablet Take 1 tablet (20 mg total) by mouth daily. 02/23/22  Yes Wendling, Jilda Roche, DO  feeding supplement (ENSURE ENLIVE / ENSURE PLUS) LIQD Take 237 mLs by mouth 3 (three) times daily with meals. 05/04/22  Yes Rodolph Bong, MD  metFORMIN (GLUCOPHAGE-XR) 500 MG 24 hr tablet Take 1 tablet (500 mg total) by mouth daily with breakfast. 05/31/22  Yes Wendling, Jilda Roche, DO  olmesartan (BENICAR) 20 MG tablet Take 1 tablet (20 mg total) by mouth daily. 05/17/22  Yes Wendling, Jilda Roche, DO  methocarbamol (ROBAXIN) 500 MG tablet Take 1 tablet (500 mg total) by mouth every 8 (eight) hours as needed for muscle spasms. Patient not taking: Reported on 06/21/2022 05/04/22   Adam Phenix, PA-C  nitroGLYCERIN (NITRODUR - DOSED IN MG/24 HR) 0.2 mg/hr patch Cut and apply 1/4 patch to most painful area q24h. 06/17/21   Myra Rude, MD  pantoprazole (PROTONIX) 40 MG tablet  Take 1 tablet (40 mg total) by mouth daily. Patient not taking: Reported on 06/21/2022 05/04/22 05/04/23  Rodolph Bong, MD  polycarbophil (FIBERCON) 625 MG tablet Take 1 tablet (625 mg total) by mouth 2 (two) times daily. 05/04/22   Rodolph Bong, MD  PRESCRIPTION MEDICATION Inject 4 mLs as directed as needed (erectile disfunction). prostaglandin Patient not taking: Reported on 06/21/2022    [provider]  Semaglutide,0.25 or 0.5MG /DOS, (OZEMPIC, 0.25 OR 0.5 MG/DOSE,) 2 MG/1.5ML SOPN Inject 0.25 mg into the skin once a week for 28 days, THEN 0.5 mg once a week for 28 days. 05/17/22 07/12/22  Sharlene Dory, DO    Current Outpatient Medications  Medication Sig Dispense Refill   atorvastatin (LIPITOR) 20 MG tablet Take 1 tablet (20 mg total) by mouth daily. 90 tablet 3   feeding supplement (ENSURE ENLIVE / ENSURE PLUS) LIQD Take 237 mLs by mouth 3 (three) times daily with meals. 237 mL 12   metFORMIN (GLUCOPHAGE-XR) 500 MG 24 hr tablet Take 1 tablet (500 mg total) by mouth daily with breakfast. 90 tablet 0   olmesartan (BENICAR) 20 MG tablet Take 1 tablet (20 mg total) by mouth daily. 30 tablet 2   methocarbamol (ROBAXIN) 500 MG tablet Take 1 tablet (500 mg total) by mouth every 8 (eight) hours as needed for muscle spasms. (Patient not taking: Reported on 06/21/2022) 40 tablet 0   nitroGLYCERIN (NITRODUR - DOSED IN MG/24 HR) 0.2 mg/hr patch Cut and apply 1/4  patch to most painful area q24h. 30 patch 11   pantoprazole (PROTONIX) 40 MG tablet Take 1 tablet (40 mg total) by mouth daily. (Patient not taking: Reported on 06/21/2022) 30 tablet 1   polycarbophil (FIBERCON) 625 MG tablet Take 1 tablet (625 mg total) by mouth 2 (two) times daily.     PRESCRIPTION MEDICATION Inject 4 mLs as directed as needed (erectile disfunction). prostaglandin (Patient not taking: Reported on 06/21/2022)     Semaglutide,0.25 or 0.5MG /DOS, (OZEMPIC, 0.25 OR 0.5 MG/DOSE,) 2 MG/1.5ML SOPN Inject 0.25 mg into  the skin once a week for 28 days, THEN 0.5 mg once a week for 28 days. 2 mL 1   Current Facility-Administered Medications  Medication Dose Route Frequency Provider Last Rate Last Admin   0.9 %  sodium chloride infusion  500 mL Intravenous Once Meryl Dare, MD        Allergies as of 07/05/2022   (No Known Allergies)    Family History  Problem Relation Age of Onset   Heart disease Mother    Diabetes Father    Cancer Father        post age of 60   Colon cancer Neg Hx    Colon polyps Neg Hx    Esophageal cancer Neg Hx    Rectal cancer Neg Hx    Stomach cancer Neg Hx     Social History   Socioeconomic History   Marital status: Married    Spouse name: Not on file   Number of children: Not on file   Years of education: Not on file   Highest education level: Not on file  Occupational History   Not on file  Tobacco Use   Smoking status: Former    Packs/day: 0.50    Years: 5.00    Additional pack years: 0.00    Total pack years: 2.50    Types: Cigarettes    Start date: 36    Quit date: 1993    Years since quitting: 31.3   Smokeless tobacco: Never  Vaping Use   Vaping Use: Never used  Substance and Sexual Activity   Alcohol use: Yes    Comment: occasional   Drug use: Never   Sexual activity: Not Currently  Other Topics Concern   Not on file  Social History Narrative   Not on file   Social Determinants of Health   Financial Resource Strain: Low Risk  (05/14/2022)   Overall Financial Resource Strain (CARDIA)    Difficulty of Paying Living Expenses: Not hard at all  Food Insecurity: No Food Insecurity (05/14/2022)   Hunger Vital Sign    Worried About Running Out of Food in the Last Year: Never true    Ran Out of Food in the Last Year: Never true  Transportation Needs: No Transportation Needs (05/14/2022)   PRAPARE - Administrator, Civil Service (Medical): No    Lack of Transportation (Non-Medical): No  Physical Activity: Sufficiently Active  (05/14/2022)   Exercise Vital Sign    Days of Exercise per Week: 7 days    Minutes of Exercise per Session: 30 min  Stress: Not on file  Social Connections: Socially Integrated (05/14/2022)   Social Connection and Isolation Panel [NHANES]    Frequency of Communication with Friends and Family: More than three times a week    Frequency of Social Gatherings with Friends and Family: Not on file    Attends Religious Services: More than 4 times per year  Active Member of Clubs or Organizations: Yes    Attends Banker Meetings: 1 to 4 times per year    Marital Status: Married  Catering manager Violence: Not At Risk (04/25/2022)   Humiliation, Afraid, Rape, and Kick questionnaire    Fear of Current or Ex-Partner: No    Emotionally Abused: No    Physically Abused: No    Sexually Abused: No    Review of Systems:  All systems reviewed were negative except where noted in HPI.   Physical Exam: General:  Alert, well-developed, in NAD Head:  Normocephalic and atraumatic. Eyes:  Sclera clear, no icterus.   Conjunctiva pink. Ears:  Normal auditory acuity. Mouth:  No deformity or lesions.  Neck:  Supple; no masses. Lungs:  Clear throughout to auscultation.   No wheezes, crackles, or rhonchi.  Heart:  Regular rate and rhythm; no murmurs. Abdomen:  Soft, nondistended, nontender. No masses, hepatomegaly. No palpable masses.  Normal bowel sounds.    Rectal:  Deferred   Msk:  Symmetrical without gross deformities. Extremities:  Without edema. Neurologic:  Alert and  oriented x 4; grossly normal neurologically. Skin:  Intact without significant lesions or rashes. Psych:  Alert and cooperative. Normal mood and affect.   Venita Lick. Russella Dar  07/05/2022, 8:20 AM See Loretha Stapler, [facility], to contact our on call provider

## 2022-07-06 ENCOUNTER — Telehealth: Payer: Self-pay

## 2022-07-06 NOTE — Telephone Encounter (Signed)
  Follow up Call-     07/05/2022    7:46 AM  Call back number  Post procedure Call Back phone  # [phone]  Permission to leave phone message Yes     Patient questions:  Do you have a fever, pain , or abdominal swelling? No. Pain Score  0 *  Have you tolerated food without any problems? Yes.    Have you been able to return to your normal activities? Yes.    Do you have any questions about your discharge instructions: Diet   No. Medications  No. Follow up visit  No.  Do you have questions or concerns about your Care? No.  Actions: * If pain score is 4 or above: No action needed, pain <4.

## 2022-07-07 ENCOUNTER — Other Ambulatory Visit: Payer: Self-pay | Admitting: Family Medicine

## 2022-07-07 DIAGNOSIS — R2689 Other abnormalities of gait and mobility: Secondary | ICD-10-CM

## 2022-07-07 DIAGNOSIS — R5381 Other malaise: Secondary | ICD-10-CM

## 2022-07-22 ENCOUNTER — Encounter: Payer: Self-pay | Admitting: Gastroenterology

## 2022-07-29 NOTE — Therapy (Signed)
OUTPATIENT PHYSICAL THERAPY NEURO EVALUATION   Patient Name: Noah Lane MRN: [id number] DOB:[birth year], 67 y.o., male Today's Date: 08/03/2022   END OF SESSION:  PT End of Session - 08/03/22 0804     Visit Number 1    Date for PT Re-Evaluation 09/28/22    Authorization Type [employer]    PT Start Time 0804    PT Stop Time 0849    PT Time Calculation (min) 45 min    Activity Tolerance Patient tolerated treatment well    Behavior During Therapy WFL for tasks assessed/performed             Past Medical History:  Diagnosis Date   Diabetes mellitus type 2 in obese    Mixed hyperlipidemia    Past Surgical History:  Procedure Laterality Date   COLON RESECTION N/A 04/27/2022   Procedure: LAPAROSCOPIC HAND ASSISTED RIGHT HEMI COLECTOMY, POSSIBLE OPEN;  Surgeon: Fritzi Mandes, MD;  Location: [facility];  Service: General;  Laterality: N/A;   COLONOSCOPY     HERNIA REPAIR  2012   Patient Active Problem List   Diagnosis Date Noted   Elevated blood pressure reading 05/04/2022   Colon adenocarcinoma (HCC) 05/04/2022   Gastroesophageal reflux disease 04/29/2022   Ileus, postoperative (HCC) 04/29/2022   Hypokalemia 04/28/2022   Hyperlipidemia 04/28/2022   Colonic obstruction (HCC) 04/25/2022   Hyponatremia 04/25/2022   OSA on CPAP 12/03/2021   Obesity (BMI 30.0-34.9) 12/03/2021   Mixed hyperlipidemia 10/20/2021   Strain of lumbar region 10/16/2021   Strain of calf muscle 07/22/2021   Patellofemoral pain syndrome of both knees 02/24/2021   Rotator cuff tendinitis, right 12/28/2019   Hip pain, bilateral 05/28/2019   Chronic bilateral low back pain without sciatica 05/28/2019   Diabetes mellitus type 2 in obese 05/28/2019   Erectile dysfunction 05/28/2019   Pain and swelling of left knee 03/12/2019   Lateral epicondylitis, right elbow 03/15/2017    PCP: Sharlene Dory, DO   REFERRING PROVIDER: Sharlene Dory, DO  REFERRING DIAG:  R53.81  (ICD-10-CM) - Physical deconditioning  R26.89 (ICD-10-CM) - Balance disorder   THERAPY DIAG:  Muscle weakness (generalized)  Unsteadiness on feet  Difficulty in walking, not elsewhere classified  RATIONALE FOR EVALUATION AND TREATMENT: Rehabilitation  ONSET DATE: 04/27/22 - laparoscopic R hemi colectomy for colon cancer  NEXT MD VISIT: TBD   SUBJECTIVE:  SUBJECTIVE STATEMENT: Pt reports his current deficits are result deconditioning after surgery in Feb for colon cancer. He feels that his recovery has been slow as he was out of shape before surgery. He notes concerns for his balance - no falls in past 6 months, but notes 2 falls in the same week ~1 yr ago. Starts day with stretches and slowly moving around. He gets stiff and has difficulty getting his balance on getting up from sitting. He notes he is aware of having a weak core. Notes numbness and tingling in feet, esp by end of day. Stairs are more difficult by end of day but does have a stair glider/lift. Once he completes PT, he plans to resume using his Tai Chi videos. Sees a chiropractor for "tune-ups" on his spine.   Pt accompanied by: self  PAIN: Are you having pain? No  PERTINENT HISTORY:  DM-II, elevated BP, GERD, OSA, chronic LBP, h/o R RTC tendinitis, B patellofemoral pain syndrome, B hip pain  PRECAUTIONS: None  WEIGHT BEARING RESTRICTIONS: No  FALLS:  Has patient fallen in last 6 months? 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  COGNITION: Overall cognitive status: History of cognitive impairments - at baseline - mostly short-term memory recall   SENSATION: WFL Intermittent numbness and tingling in soles of B feet - most common upon getting up out of a chair  MUSCLE LENGTH: Hamstrings: mod tight L, mild tight R ITB: mod tight L, mild tight R Piriformis: mild tight L>R Hip flexors: mild tight L>R Quads: mild tight L>R Heelcord: NT  POSTURE:  No Significant postural limitations  LOWER EXTREMITY ROM:    Grossly WFL other than limitations due to tightness as above  LOWER EXTREMITY MMT:    MMT Right Eval Left Eval  Hip flexion 4- 4  Hip extension 4- 4-  Hip abduction 4- 4-  Hip adduction 4- 4-  Hip internal rotation 3+ 4-  Hip external rotation 3+ 3+  Knee flexion 4+ 4  Knee extension 4+ 4+  Ankle dorsiflexion 4+ 4+  Ankle plantarflexion    Ankle inversion    Ankle eversion    (Blank rows = not tested)  BED MOBILITY:  Sit to supine Modified independence Supine to sit Modified independence Rolling to Right SBA  TRANSFERS: Assistive device utilized: None  Sit to stand: Complete Independence Stand to sit: Complete Independence Chair to chair: Complete Independence Floor:  NT  GAIT: Distance walked: 80 ft Assistive device utilized: None Level of assistance: Complete Independence Gait pattern: step through pattern, increased lateral sway Comments: Gait speed = 4.22 ft/sec  RAMP: Level of Assistance:  NT Assistive device utilized:  NT Ramp Comments:   CURB:  Level of  Assistance:  NT Assistive device utilized:  NT Curb Comments:  STAIRS:  Level of Assistance:  TBA next visit  Stair Negotiation Technique:   Number of Stairs:    Height of Stairs:   Comments:   FUNCTIONAL TESTS:  5 times sit to stand: 16.54 sec Timed up and go (TUG): 7.81 sec 10 meter walk test: 7.78 sec Berg Balance Scale: TBA next visit Functional gait assessment: TBA next visit  PATIENT SURVEYS:  ABC scale 1190 / 1600 = 74.4 %   TODAY'S TREATMENT:   08/03/22 Initial eval only   PATIENT EDUCATION:  Education details: PT eval findings and anticipated POC  Person educated: Patient Education method: Explanation Education comprehension: verbalized understanding  HOME EXERCISE PROGRAM: TBD   ASSESSMENT:  CLINICAL IMPRESSION: Noah Lane is a 67 y.o. male who was seen today for physical therapy evaluation and treatment for physical deconditioning and balance disorder s/p surgery for colon cancer in February.  He feels that his recovery post-surgery has been slower than he would like in part due to the fact that he was somewhat deconditioned/" out of shape" going into surgery.  He denies any falls in the past 6 months but does note 2 falls within the same week ~1 year ago.  Current deficits include limited flexibility, mild to moderate LE weakness, impaired transitional mobility, and decreased balance.  Will complete formal balance assessment with Berg and FGA next visit to better determine deficits related to balance.  Noah Lane will benefit from skilled PT to address above deficits to improve mobility and activity tolerance with decreased risk for falls.   OBJECTIVE IMPAIRMENTS: decreased activity tolerance, decreased balance, decreased knowledge of condition, decreased mobility, difficulty walking, decreased strength, decreased safety awareness, impaired perceived functional ability, impaired flexibility, and impaired sensation.   ACTIVITY LIMITATIONS: carrying, lifting,  bending, standing, squatting, stairs, transfers, bed mobility, locomotion level, and caring for others  PARTICIPATION LIMITATIONS: meal prep, cleaning, laundry, community activity, and occupation  PERSONAL FACTORS: Fitness, Past/current experiences, Time since onset of injury/illness/exacerbation, and 3+ comorbidities: DM-II, elevated BP, GERD, OSA, chronic LBP, h/o R RTC tendinitis, B patellofemoral pain syndrome, B hip pain  are also affecting patient's functional outcome.   REHAB POTENTIAL: Good  CLINICAL DECISION MAKING: Stable/uncomplicated  EVALUATION COMPLEXITY: Low   GOALS: Goals reviewed with patient? Yes  SHORT TERM GOALS: Target date: 08/31/2022   Patient will be independent with initial HEP to improve outcomes and carryover.  Baseline:  Goal status: INITIAL  2.  Patient will be educated on strategies to decrease risk of falls.  Baseline:  Goal status: INITIAL  3.  Patient will demonstrate decreased 5xSTS time to </= 12 sec to decrease risk for falls with transitional mobility. Baseline: 16.54 sec Goal status: INITIAL  LONG TERM GOALS: Target date: 09/28/2022   Patient will be independent with advanced/ongoing HEP to facilitate ability to maintain/progress functional gains from skilled physical therapy services. Baseline:  Goal status: INITIAL  2.  Patient will demonstrate improved B LE strength to >/= 4 to 4+/5 for improved stability and ease of mobility . Baseline: Refer to above MMT table Goal status: INITIAL  3.  Patient will be report ability to ambulate for >/= 30 min w/o AD on variable surfaces with good safety to access community.  Baseline:  Goal status: INITIAL  4.  Patient will be able to step up/down curb safely w/o AD for safety with community ambulation.  Baseline:  Goal status: INITIAL   5.  Patient will report ability to navigate stairs to second floor of home without limitation due to  fatigue by end of day. Baseline:  Goal status: INITIAL    6.  Patient will improve Berg score by >/= 8 points to improve safety and stability with ADLs in standing and reduce risk for falls.  Baseline: TBA Goal status: INITIAL  7. Patient will improve FGA score to at least 19/30 to improve gait stability and reduce risk for falls. Baseline: TBA Goal status: INITIAL  10.  Patient will report >/= 90% balance confidence on ABC scale to demonstrate improved functional ability. Baseline: 1190 / 1600 = 74.4 % Goal status: INITIAL   PLAN:  PT FREQUENCY: 2x/week  PT DURATION: 6-8 weeks  PLANNED INTERVENTIONS: Therapeutic exercises, Therapeutic activity, Neuromuscular re-education, Balance training, Gait training, Patient/Family education, Self Care, Joint mobilization, Stair training, DME instructions, Aquatic Therapy, Dry Needling, Electrical stimulation, Cryotherapy, Moist heat, Taping, Ultrasound, Manual therapy, and Re-evaluation  PLAN FOR NEXT SESSION: Create initial HEP for core/lumbopelvic flexibility and strengthening; Next visit with PT: complete balance assessment - Berg & FGA   Marry Guan, PT 08/03/2022, 3:16 PM

## 2022-08-02 ENCOUNTER — Ambulatory Visit (INDEPENDENT_AMBULATORY_CARE_PROVIDER_SITE_OTHER): Payer: Medicare Other | Admitting: Behavioral Health

## 2022-08-02 DIAGNOSIS — F4323 Adjustment disorder with mixed anxiety and depressed mood: Secondary | ICD-10-CM

## 2022-08-02 NOTE — Progress Notes (Signed)
[facility] Counselor Initial Adult Exam  Name: Noah Lane Date: 08/02/2022 MRN: [id number] DOB: 1955/04/22 PCP: Sharlene Dory, DO  Time spent: 60 min In Person @ [facility]  Guardian/Payee:  Candace Cruise [employer] A [employer] B    Paperwork requested: No   Reason for Visit /Presenting Problem: Elevated anx/dep & stress due to current health status changes in the year 2024 & resulting in a review of life & future goals.  Mental Status Exam: Appearance:   Casual     Behavior:  Appropriate and Sharing  Motor:  Normal  Speech/Language:   Clear and Coherent  Affect:  Appropriate  Mood:  anxious  Thought process:  normal  Thought content:    WNL  Sensory/Perceptual disturbances:    WNL  Orientation:  oriented to person, place, and time/date  Attention:  Good  Concentration:  Good  Memory:  WNL  Fund of knowledge:   Good  Insight:    Good  Judgment:   Good  Impulse Control:  Good    Risk Assessment: Danger to Self:  No Self-injurious Behavior: No Danger to Others: No Duty to Warn:no Physical Aggression / Violence:No  Access to Firearms a concern: No  Gang Involvement:No  Patient / guardian was educated about steps to take if suicide or homicide risk level increases between visits: yes; appropriate to ICD process While future psychiatric events cannot be accurately predicted, the patient does not currently require acute inpatient psychiatric care and does not currently meet [facility] involuntary commitment criteria.  Substance Abuse History: Current substance abuse: No     Past Psychiatric History:   No previous psychological problems have been observed Outpatient Providers: Arva Chafe, DO History of Psych Hospitalization: No  Psychological Testing:  NA    Abuse History:  Victim of: No.,  NA    Report needed: No. Victim of Neglect:No. Perpetrator of  NA   Witness / Exposure to Domestic Violence: No   Protective Services Involvement: No   Witness to [employer] Violence:  No   Family History:  Family History  Problem Relation Age of Onset   Heart disease Mother    Diabetes Father    Cancer Father        post age of 53   Colon cancer Neg Hx    Colon polyps Neg Hx    Esophageal cancer Neg Hx    Rectal cancer Neg Hx    Stomach cancer Neg Hx     Living situation: the patient lives with their spouse  Sexual Orientation: Straight  Relationship Status: married  Name of spouse / other:Cathy If a parent, number of children / ages: None  Support Systems: spouse friends Co-Workers  Surveyor, quantity Stress:  No ; none identified; Pt acknowledges Retirement Plans  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 History of legal issue / charges:  NA  Medical History/Surgical History: reviewed Past Medical History:  Diagnosis Date   Diabetes mellitus type 2 in obese  Mixed hyperlipidemia     Past Surgical History:  Procedure Laterality Date   COLON RESECTION N/A 04/27/2022   Procedure: LAPAROSCOPIC HAND ASSISTED RIGHT HEMI COLECTOMY, POSSIBLE OPEN;  Surgeon: Fritzi Mandes, MD;  Location: [facility];  Service: General;  Laterality: N/A;   COLONOSCOPY     HERNIA REPAIR  2012    Medications: Current Outpatient Medications  Medication Sig Dispense Refill   atorvastatin (LIPITOR) 20 MG tablet Take 1 tablet (20  mg total) by mouth daily. 90 tablet 3   feeding supplement (ENSURE ENLIVE / ENSURE PLUS) LIQD Take 237 mLs by mouth 3 (three) times daily with meals. 237 mL 12   metFORMIN (GLUCOPHAGE-XR) 500 MG 24 hr tablet Take 1 tablet (500 mg total) by mouth daily with breakfast. 90 tablet 0   methocarbamol (ROBAXIN) 500 MG tablet Take 1 tablet (500 mg total) by mouth every 8 (eight) hours as needed for muscle spasms. (Patient not taking: Reported on 06/21/2022) 40 tablet 0   nitroGLYCERIN (NITRODUR - DOSED IN MG/24 HR) 0.2 mg/hr patch Cut and apply 1/4 patch to most painful area q24h. 30 patch 11   olmesartan (BENICAR) 20 MG tablet Take 1 tablet (20 mg total) by mouth daily. 30 tablet 2   pantoprazole (PROTONIX) 40 MG tablet Take 1 tablet (40 mg total) by mouth daily. (Patient not taking: Reported on 06/21/2022) 30 tablet 1   polycarbophil (FIBERCON) 625 MG tablet Take 1 tablet (625 mg total) by mouth 2 (two) times daily.     PRESCRIPTION MEDICATION Inject 4 mLs as directed as needed (erectile disfunction). prostaglandin (Patient not taking: Reported on 06/21/2022)     No current facility-administered medications for this visit.    No Known Allergies  Diagnoses:  Adjustment reaction with anxiety and depression  Plan of Care: Shadow will attend all sessions every 3-4 wks as scheduled. He will keep a Notebook btwn sessions to record the thoughts, feelings, & events that bother him so we can focus in visits to empower him. We will address his goals for reduction in neg thinking patterns, help him to prioritize & learn to let things go, & to help him cont to improve his health for a Retirement that is content.  Target Date: 08/29/2022  Progress: 4  Frequency: Once every 3-4 wks  Modality: Claretta Fraise, LMFT

## 2022-08-02 NOTE — Progress Notes (Signed)
                Sydney Hasten L Teran Knittle, LMFT 

## 2022-08-03 ENCOUNTER — Ambulatory Visit: Payer: Medicare Other | Attending: Family Medicine | Admitting: Physical Therapy

## 2022-08-03 ENCOUNTER — Encounter: Payer: Self-pay | Admitting: Physical Therapy

## 2022-08-03 ENCOUNTER — Other Ambulatory Visit: Payer: Self-pay

## 2022-08-03 DIAGNOSIS — R2681 Unsteadiness on feet: Secondary | ICD-10-CM | POA: Diagnosis present

## 2022-08-03 DIAGNOSIS — R262 Difficulty in walking, not elsewhere classified: Secondary | ICD-10-CM | POA: Insufficient documentation

## 2022-08-03 DIAGNOSIS — M6281 Muscle weakness (generalized): Secondary | ICD-10-CM | POA: Diagnosis present

## 2022-08-03 DIAGNOSIS — R5381 Other malaise: Secondary | ICD-10-CM | POA: Insufficient documentation

## 2022-08-03 DIAGNOSIS — R2689 Other abnormalities of gait and mobility: Secondary | ICD-10-CM | POA: Diagnosis not present

## 2022-08-06 ENCOUNTER — Ambulatory Visit: Payer: Medicare Other

## 2022-08-06 DIAGNOSIS — R262 Difficulty in walking, not elsewhere classified: Secondary | ICD-10-CM

## 2022-08-06 DIAGNOSIS — M6281 Muscle weakness (generalized): Secondary | ICD-10-CM

## 2022-08-06 DIAGNOSIS — R2681 Unsteadiness on feet: Secondary | ICD-10-CM

## 2022-08-06 NOTE — Therapy (Signed)
OUTPATIENT PHYSICAL THERAPY NEURO TREATMENT   Patient Name: Noah Lane MRN: [id number] DOB:[birth year], 67 y.o., male Today's Date: 08/06/2022   END OF SESSION:  PT End of Session - 08/06/22 0807     Visit Number 2    Date for PT Re-Evaluation 09/28/22    Authorization Type [employer]    PT Start Time 0804    PT Stop Time 0845    PT Time Calculation (min) 41 min    Activity Tolerance Patient tolerated treatment well    Behavior During Therapy WFL for tasks assessed/performed              Past Medical History:  Diagnosis Date   Diabetes mellitus type 2 in obese    Mixed hyperlipidemia    Past Surgical History:  Procedure Laterality Date   COLON RESECTION N/A 04/27/2022   Procedure: LAPAROSCOPIC HAND ASSISTED RIGHT HEMI COLECTOMY, POSSIBLE OPEN;  Surgeon: Fritzi Mandes, MD;  Location: [facility];  Service: General;  Laterality: N/A;   COLONOSCOPY     HERNIA REPAIR  2012   Patient Active Problem List   Diagnosis Date Noted   Elevated blood pressure reading 05/04/2022   Colon adenocarcinoma (HCC) 05/04/2022   Gastroesophageal reflux disease 04/29/2022   Ileus, postoperative (HCC) 04/29/2022   Hypokalemia 04/28/2022   Hyperlipidemia 04/28/2022   Colonic obstruction (HCC) 04/25/2022   Hyponatremia 04/25/2022   OSA on CPAP 12/03/2021   Obesity (BMI 30.0-34.9) 12/03/2021   Mixed hyperlipidemia 10/20/2021   Strain of lumbar region 10/16/2021   Strain of calf muscle 07/22/2021   Patellofemoral pain syndrome of both knees 02/24/2021   Rotator cuff tendinitis, right 12/28/2019   Hip pain, bilateral 05/28/2019   Chronic bilateral low back pain without sciatica 05/28/2019   Diabetes mellitus type 2 in obese 05/28/2019   Erectile dysfunction 05/28/2019   Pain and swelling of left knee 03/12/2019   Lateral epicondylitis, right elbow 03/15/2017    PCP: Sharlene Dory, DO   REFERRING PROVIDER: Sharlene Dory, DO  REFERRING DIAG:  R53.81  (ICD-10-CM) - Physical deconditioning  R26.89 (ICD-10-CM) - Balance disorder   THERAPY DIAG:  Muscle weakness (generalized)  Unsteadiness on feet  Difficulty in walking, not elsewhere classified  RATIONALE FOR EVALUATION AND TREATMENT: Rehabilitation  ONSET DATE: 04/27/22 - laparoscopic R hemi colectomy for colon cancer  NEXT MD VISIT: TBD   SUBJECTIVE:  SUBJECTIVE STATEMENT: Pt notes he is doing well. He walked a little bit before coming in.   Pt accompanied by: self  PAIN: Are you having pain? No  PERTINENT HISTORY:  DM-II, elevated BP, GERD, OSA, chronic LBP, h/o R RTC tendinitis, B patellofemoral pain syndrome, B hip pain  PRECAUTIONS: None  WEIGHT BEARING RESTRICTIONS: No  FALLS:  Has patient fallen in last 6 months? 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  COGNITION: Overall cognitive status: History of cognitive impairments - at baseline - mostly short-term memory recall   SENSATION: WFL Intermittent numbness and tingling in soles of B feet - most common upon getting  up out of a chair  MUSCLE LENGTH: Hamstrings: mod tight L, mild tight R ITB: mod tight L, mild tight R Piriformis: mild tight L>R Hip flexors: mild tight L>R Quads: mild tight L>R Heelcord: NT  POSTURE:  No Significant postural limitations  LOWER EXTREMITY ROM:    Grossly WFL other than limitations due to tightness as above  LOWER EXTREMITY MMT:    MMT Right Eval Left Eval  Hip flexion 4- 4  Hip extension 4- 4-  Hip abduction 4- 4-  Hip adduction 4- 4-  Hip internal rotation 3+ 4-  Hip external rotation 3+ 3+  Knee flexion 4+ 4  Knee extension 4+ 4+  Ankle dorsiflexion 4+ 4+  Ankle plantarflexion    Ankle inversion    Ankle eversion    (Blank rows = not tested)  BED MOBILITY:  Sit to supine Modified independence Supine to sit Modified independence Rolling to Right SBA  TRANSFERS: Assistive device utilized: None  Sit to stand: Complete Independence Stand to sit: Complete Independence Chair to chair: Complete Independence Floor:  NT  GAIT: Distance walked: 80 ft Assistive device utilized: None Level of assistance: Complete Independence Gait pattern: step through pattern, increased lateral sway Comments: Gait speed = 4.22 ft/sec  RAMP: Level of Assistance:  NT Assistive device utilized:  NT Ramp Comments:   CURB:  Level of Assistance:  NT Assistive device utilized:  NT Curb Comments:   STAIRS:  Level of Assistance:  TBA next visit  Stair Negotiation Technique:   Number of Stairs:    Height of Stairs:   Comments:   FUNCTIONAL TESTS:  5 times sit to stand: 16.54 sec Timed up and go (TUG): 7.81 sec 10 meter walk test: 7.78 sec Berg Balance Scale: TBA next visit Functional gait assessment: TBA next visit  PATIENT SURVEYS:  ABC scale 1190 / 1600 = 74.4 %   TODAY'S TREATMENT:  08/06/22 Therapeutic Exercise: to improve strength and mobility.  Demo, verbal and tactile cues throughout for technique.  Bike L2x18min Seated hamstring stretch 2x30  sec bil Supine bridge 2x10 Supine LTR x 10 each direction Supine  quad stretch x 30 sec reviewed Standing functional squat x 10  Heel raises 2x10 Marching 2x10  08/03/22 Initial eval only   PATIENT EDUCATION:  Education details: HEP update  Person educated: Patient Education method: Explanation Education comprehension: verbalized understanding  HOME EXERCISE PROGRAM: Access Code: [email or web address] URL: [email or web address] Date: 08/06/2022 Prepared by: Verta Ellen  Exercises - Seated Hamstring Stretch  - 1 x daily - 7 x weekly - 3 sets - 30 sec hold - Supine Quadriceps Stretch with Strap on Table  - 1 x daily - 7 x weekly - 3 sets - 30 sec hold - Supine Bridge  - 1 x daily - 7 x weekly - 3 sets - 10 reps - Squat with Counter Support  - 1 x daily - 7 x weekly - 3 sets - 10 reps   ASSESSMENT:  CLINICAL IMPRESSION: Progressed through exercises to improve LE flexibility and strength. Pt showed good tolerance for exercises today. Showed fatigue after the marches and squats. Provided initial HEP today with instruction and handout provided. Good response to initial treatment.   OBJECTIVE IMPAIRMENTS: decreased activity tolerance, decreased balance, decreased knowledge of condition, decreased mobility, difficulty walking, decreased strength, decreased safety awareness, impaired perceived functional ability, impaired flexibility, and impaired sensation.   ACTIVITY LIMITATIONS: carrying, lifting, bending, standing, squatting, stairs, transfers, bed mobility, locomotion level, and caring for others  PARTICIPATION LIMITATIONS: meal prep, cleaning, laundry, community activity, and occupation  PERSONAL FACTORS: Fitness, Past/current experiences, Time since onset of injury/illness/exacerbation, and 3+ comorbidities: DM-II, elevated BP, GERD, OSA, chronic LBP, h/o R RTC tendinitis, B patellofemoral pain syndrome, B hip pain  are also affecting patient's functional outcome.   REHAB  POTENTIAL: Good  CLINICAL DECISION MAKING: Stable/uncomplicated  EVALUATION COMPLEXITY: Low   GOALS: Goals reviewed with patient? Yes  SHORT TERM GOALS: Target date: 08/31/2022   Patient will be independent with initial HEP to improve outcomes and carryover.  Baseline:  Goal status: IN PROGRESS  2.  Patient will be educated on strategies to decrease risk of falls.  Baseline:  Goal status: IN PROGRESS  3.  Patient will demonstrate decreased 5xSTS time to </= 12 sec to decrease risk for falls with transitional mobility. Baseline: 16.54 sec Goal status: IN PROGRESS  LONG TERM GOALS: Target date: 09/28/2022   Patient will be independent with advanced/ongoing HEP to facilitate ability to maintain/progress functional gains from skilled physical therapy services. Baseline:  Goal status: IN PROGRESS  2.  Patient will demonstrate improved B LE strength to >/= 4 to 4+/5 for improved stability and ease of mobility . Baseline: Refer to above MMT table Goal status: IN PROGRESS  3.  Patient will be report ability to ambulate for >/= 30 min w/o AD on variable surfaces with good safety to access community.  Baseline:  Goal status: IN PROGRESS  4.  Patient will be able to step up/down curb safely w/o AD for safety with community ambulation.  Baseline:  Goal status: IN PROGRESS   5.  Patient will report ability to navigate stairs to second floor of home without limitation due to fatigue by end of day. Baseline:  Goal status: IN PROGRESS   6.  Patient will improve Berg score by >/= 8 points to improve safety and stability with ADLs in standing and reduce risk for falls.  Baseline: TBA Goal status: IN PROGRESS  7. Patient will improve FGA score to at least 19/30 to improve gait stability and reduce risk for falls. Baseline: TBA Goal status: IN PROGRESS  10.  Patient will report >/= 90% balance confidence on ABC scale to demonstrate improved functional ability. Baseline: 1190 / 1600 =  74.4 % Goal status: IN PROGRESS   PLAN:  PT FREQUENCY: 2x/week  PT DURATION: 6-8 weeks  PLANNED INTERVENTIONS: Therapeutic exercises, Therapeutic activity, Neuromuscular re-education, Balance training, Gait training, Patient/Family education, Self Care, Joint mobilization, Stair training, DME instructions, Aquatic Therapy, Dry Needling, Electrical stimulation, Cryotherapy, Moist heat, Taping, Ultrasound, Manual therapy, and Re-evaluation  PLAN FOR NEXT SESSION: review HEP for core/lumbopelvic flexibility and strengthening; Next visit with PT: complete balance assessment - Berg & FGA   Darleene Cleaver, PTA 08/06/2022, 9:23 AM

## 2022-08-09 ENCOUNTER — Ambulatory Visit (INDEPENDENT_AMBULATORY_CARE_PROVIDER_SITE_OTHER): Payer: Medicare Other | Admitting: Behavioral Health

## 2022-08-09 DIAGNOSIS — F4323 Adjustment disorder with mixed anxiety and depressed mood: Secondary | ICD-10-CM

## 2022-08-09 NOTE — Progress Notes (Signed)
[facility] Counselor/Therapist Progress Note  Patient ID: Noah Lane, MRN: [id number],    Date: 08/09/2022  Time Spent: 55 min In person @ [facility]   Treatment Type: Individual Therapy  Reported Symptoms: Pt has considered how authentic he is & wants to attend to this quality. He is striving to be his best self since health status changes.  Mental Status Exam: Appearance:  Casual     Behavior: Appropriate and Sharing  Motor: Normal  Speech/Language:  Clear and Coherent  Affect: Appropriate  Mood: normal  Thought process: normal  Thought content:   WNL  Sensory/Perceptual disturbances:   WNL  Orientation: oriented to person, place, and time/date  Attention: Good  Concentration: Good  Memory: WNL  Fund of knowledge:  Good  Insight:   Good  Judgment:  Good  Impulse Control: Good   Risk Assessment: Danger to Self:  No Self-injurious Behavior: No Danger to Others: No Duty to Warn:no Physical Aggression / Violence:No  Access to Firearms a concern: No  Gang Involvement:No   Subjective: Pt is interested in how his life can be more productive since his major bout w/Colon Cancer. He is blaming himself for not catching it earlier w/testing & beating himself up over the way his work & social life has played out the past 6mos. He has goals for his career & wants to end it on a positive & lucrative note. Pt feels like he has returned to former functioning by 50%.  Interventions: Solution-Oriented/Positive Psychology, Psycho-education/Bibliotherapy, and Family Systems  Diagnosis:Adjustment reaction with anxiety and depression  Plan: Noah Lane will consider his POA for returning to his psychological former 50% & record his plan to further this goal. He will put into place actions he can take that are reasonable to pursue this plan.  Target Date: 08/29/2022  Progress: 5  Frequency: Once every 3-4 wks  Modality: Claretta Fraise, LMFT

## 2022-08-09 NOTE — Progress Notes (Signed)
                Karagan Lehr L Messiah Rovira, LMFT 

## 2022-08-11 ENCOUNTER — Encounter: Payer: Self-pay | Admitting: Physical Therapy

## 2022-08-11 ENCOUNTER — Ambulatory Visit: Payer: Medicare Other | Admitting: Physical Therapy

## 2022-08-11 DIAGNOSIS — M6281 Muscle weakness (generalized): Secondary | ICD-10-CM | POA: Diagnosis not present

## 2022-08-11 DIAGNOSIS — R262 Difficulty in walking, not elsewhere classified: Secondary | ICD-10-CM

## 2022-08-11 DIAGNOSIS — R2681 Unsteadiness on feet: Secondary | ICD-10-CM

## 2022-08-11 NOTE — Therapy (Signed)
OUTPATIENT PHYSICAL THERAPY TREATMENT   Patient Name: Noah Lane MRN: [id number] DOB:05/20/1955, 67 y.o., male Today's Date: 08/11/2022   END OF SESSION:  PT End of Session - 08/11/22 0853     Visit Number 3    Date for PT Re-Evaluation 09/28/22    Authorization Type [employer]    PT Start Time 0853    PT Stop Time 0941    PT Time Calculation (min) 48 min    Activity Tolerance Patient tolerated treatment well    Behavior During Therapy [facility] for tasks assessed/performed               Past Medical History:  Diagnosis Date   Diabetes mellitus type 2 in obese    Mixed hyperlipidemia    Past Surgical History:  Procedure Laterality Date   COLON RESECTION N/A 04/27/2022   Procedure: LAPAROSCOPIC HAND ASSISTED RIGHT HEMI COLECTOMY, POSSIBLE OPEN;  Surgeon: Fritzi Mandes, MD;  Location: [facility];  Service: General;  Laterality: N/A;   COLONOSCOPY     HERNIA REPAIR  2012   Patient Active Problem List   Diagnosis Date Noted   Elevated blood pressure reading 05/04/2022   Colon adenocarcinoma (HCC) 05/04/2022   Gastroesophageal reflux disease 04/29/2022   Ileus, postoperative (HCC) 04/29/2022   Hypokalemia 04/28/2022   Hyperlipidemia 04/28/2022   Colonic obstruction (HCC) 04/25/2022   Hyponatremia 04/25/2022   OSA on CPAP 12/03/2021   Obesity (BMI 30.0-34.9) 12/03/2021   Mixed hyperlipidemia 10/20/2021   Strain of lumbar region 10/16/2021   Strain of calf muscle 07/22/2021   Patellofemoral pain syndrome of both knees 02/24/2021   Rotator cuff tendinitis, right 12/28/2019   Hip pain, bilateral 05/28/2019   Chronic bilateral low back pain without sciatica 05/28/2019   Diabetes mellitus type 2 in obese 05/28/2019   Erectile dysfunction 05/28/2019   Pain and swelling of left knee 03/12/2019   Lateral epicondylitis, right elbow 03/15/2017    PCP: Sharlene Dory, DO   REFERRING PROVIDER: Sharlene Dory, DO  REFERRING DIAG:  R53.81  (ICD-10-CM) - Physical deconditioning  R26.89 (ICD-10-CM) - Balance disorder   THERAPY DIAG:  Muscle weakness (generalized)  Unsteadiness on feet  Difficulty in walking, not elsewhere classified  RATIONALE FOR EVALUATION AND TREATMENT: Rehabilitation  ONSET DATE: 04/27/22 - laparoscopic R hemi colectomy for colon cancer  NEXT MD VISIT: TBD   SUBJECTIVE:  SUBJECTIVE STATEMENT: Patient reports he missed his morning walk today but otherwise doing okay.  PAIN: Are you having pain? No  PERTINENT HISTORY:  DM-II, elevated BP, GERD, OSA, chronic LBP, h/o R RTC tendinitis, B patellofemoral pain syndrome, B hip pain  PRECAUTIONS: None  WEIGHT BEARING RESTRICTIONS: No  FALLS:  Has patient fallen in last 6 months? 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  COGNITION: Overall cognitive status: History of cognitive impairments - at baseline - mostly short-term memory recall   SENSATION: WFL Intermittent numbness and tingling in soles of B feet - most common upon getting up out of a  chair  MUSCLE LENGTH: Hamstrings: mod tight L, mild tight R ITB: mod tight L, mild tight R Piriformis: mild tight L>R Hip flexors: mild tight L>R Quads: mild tight L>R Heelcord: NT  POSTURE:  No Significant postural limitations  LOWER EXTREMITY ROM:    Grossly WFL other than limitations due to tightness as above  LOWER EXTREMITY MMT:    MMT Right Eval Left Eval  Hip flexion 4- 4  Hip extension 4- 4-  Hip abduction 4- 4-  Hip adduction 4- 4-  Hip internal rotation 3+ 4-  Hip external rotation 3+ 3+  Knee flexion 4+ 4  Knee extension 4+ 4+  Ankle dorsiflexion 4+ 4+  Ankle plantarflexion    Ankle inversion    Ankle eversion    (Blank rows = not tested)  BED MOBILITY:  Sit to supine Modified independence Supine to sit Modified independence Rolling to Right SBA  TRANSFERS: Assistive device utilized: None  Sit to stand: Complete Independence Stand to sit: Complete Independence Chair to chair: Complete Independence Floor:  NT  GAIT: Distance walked: 80 ft Assistive device utilized: None Level of assistance: Complete Independence Gait pattern: step through pattern, increased lateral sway Comments: Gait speed = 4.22 ft/sec  RAMP: Level of Assistance:  NT Assistive device utilized:  NT Ramp Comments:   CURB:  Level of Assistance:  NT Assistive device utilized:  NT Curb Comments:   STAIRS: (08/11/22)  Level of Assistance: Modified independence Stair Negotiation Technique: Alternating Pattern  Forwards with Single Rail on Left Number of Stairs: 14  Height of Stairs: 7"  Comments: Good reciprocal pattern but definite need for railing for balance/support  FUNCTIONAL TESTS:  5 times sit to stand: 16.54 sec Timed up and go (TUG): 7.81 sec 10 meter walk test: 7.78 sec Berg Balance Scale: (08/11/22) 51/56, 46-51 moderate risk for falls (>50%)  Functional gait assessment: (08/11/22) 24/30, 19-24 = medium risk fall   PATIENT SURVEYS:  ABC scale 1190 / 1600 =  74.4 %   TODAY'S TREATMENT:   08/11/22 THERAPEUTIC EXERCISE: to improve flexibility, strength and mobility.  Demonstration, verbal and tactile cues throughout for technique.  Rec bike - L2 x 5:30 min Standing GTB rows in staggered stance for increased core/abdominal muscle activation 10 x 5", 2 sets Standing GTB scap retraction + shoulder extension to neutral in staggered stance for increased core/abdominal muscle activation 10 x 5", 2 sets Standing GTB lat pulldown in staggered stance for increased core/abdominal muscle activation 10 x 5", 2 sets  THERAPEUTIC ACTIVITIES: Berg: 51/56, 46-51 moderate risk for falls (>50%)  FGA: 24/30, 19-24 = medium risk fall    08/06/22 Therapeutic Exercise: to improve strength and mobility.  Demo, verbal and tactile cues throughout for technique.  Bike L2x76min Seated hamstring stretch 2x30 sec bil Supine bridge 2x10 Supine LTR x 10 each direction Supine quad stretch x 30 sec reviewed Standing functional squat x 10  Heel raises 2x10 Marching 2x10  08/03/22 Initial eval only   PATIENT EDUCATION:  Education details: PT eval findings and HEP update - Standing rows/retraction/pulldown with core engagement   Person educated: Patient Education method: Explanation, Demonstration, Verbal cues, Handouts, and MedBridgeGO access code email/texted to patient; HEP tracking log included in printouts Education comprehension: verbalized understanding, returned demonstration, and needs further education  HOME EXERCISE PROGRAM: *Access Code: [email or web address] URL: [email or web address] Date: 08/11/2022 Prepared by: Glenetta Hew  Exercises - Seated Hamstring Stretch  - 1 x daily - 7 x weekly - 3 sets - 30 sec hold - Supine Quadriceps Stretch with Strap on Table  - 1 x daily - 7 x weekly - 3 sets - 30 sec hold - Supine Bridge  - 1 x daily - 7 x weekly - 3 sets - 10 reps - Squat with Counter Support  - 1 x daily - 7 x weekly - 3 sets - 10 reps - Split  Stance Shoulder Row with Resistance  - 1 x daily - 7 x weekly - 2 sets - 10 reps - 5 sec hold - Scapular Retraction with Resistance Advanced  - 1 x daily - 7 x weekly - 2 sets - 10 reps - 5 sec hold - Staggered Lat Pull Down with Resistance - Elbows Bent  - 1 x daily - 7 x weekly - 2 sets - 10 reps - 5 sec hold  *MedBridgeGO access code email/texted to patient   ASSESSMENT:  CLINICAL IMPRESSION: Balance testing completed with Berg and FGA revealing medium/moderate fall risk, with greatest difficulties noted with narrow BOS or SLS stance activities. Rieley reports no concerns with initial HEP provided last visit denies need for review reviewed today, however did inquire about activities to help engage core while working his shoulders and upper body.  Introduced GTB rows, scapular retraction/shoulder extension and pull downs in staggered stance to better recruit abdominal/core muscle engagement.  Will continue to progress core and proximal UE/LE strengthening in upcoming visits to improve stability for progression to balance activities.   OBJECTIVE IMPAIRMENTS: decreased activity tolerance, decreased balance, decreased knowledge of condition, decreased mobility, difficulty walking, decreased strength, decreased safety awareness, impaired perceived functional ability, impaired flexibility, and impaired sensation.   ACTIVITY LIMITATIONS: carrying, lifting, bending, standing, squatting, stairs, transfers, bed mobility, locomotion level, and caring for others  PARTICIPATION LIMITATIONS: meal prep, cleaning, laundry, community activity, and occupation  PERSONAL FACTORS: Fitness, Past/current experiences, Time since onset of injury/illness/exacerbation, and 3+ comorbidities: DM-II, elevated BP, GERD, OSA, chronic LBP, h/o R RTC tendinitis, B patellofemoral pain syndrome, B hip pain  are also affecting patient's functional outcome.   REHAB POTENTIAL: Good  CLINICAL DECISION MAKING:  Stable/uncomplicated  EVALUATION COMPLEXITY: Low  GOALS: Goals reviewed with patient? Yes  SHORT TERM GOALS: Target date: 08/31/2022   Patient will be independent with initial HEP to improve outcomes and carryover.  Baseline:  Goal status: IN PROGRESS  2.  Patient will be educated on strategies to decrease risk of falls.  Baseline:  Goal status: IN PROGRESS  3.  Patient will demonstrate decreased 5xSTS time to </= 12 sec to decrease risk for falls with transitional mobility. Baseline: 16.54 sec Goal status: IN PROGRESS  LONG TERM GOALS: Target date: 09/28/2022   Patient will be independent with advanced/ongoing HEP to facilitate ability to maintain/progress functional gains from skilled physical therapy services. Baseline:  Goal status: IN PROGRESS  2.  Patient will demonstrate improved B LE strength to >/= 4 to 4+/5 for improved stability and ease of mobility . Baseline: Refer to above MMT table Goal status: IN PROGRESS  3.  Patient will be report ability to ambulate for >/= 30 min w/o AD on variable surfaces with good safety to access community.  Baseline:  Goal status: IN PROGRESS  4.  Patient will be able to step up/down curb safely w/o AD for safety with community ambulation.  Baseline:  Goal status: IN PROGRESS   5.  Patient will report ability to navigate stairs to second floor of home without limitation due to fatigue by end of day. Baseline:  Goal status: IN PROGRESS   6.  Patient will improve Berg score to >/= 54/56 to improve safety and stability with ADLs in standing and reduce risk for falls.  Baseline: 51/56 Goal status: REVISED  08/11/22  7. Patient will improve FGA score to at least 28/30 to improve gait stability and reduce risk for falls. Baseline: 24/30 Goal status: REVISED  08/11/22  10.  Patient will report >/= 90% balance confidence on ABC scale to demonstrate improved functional ability. Baseline: 1190 / 1600 = 74.4 % Goal status: IN  PROGRESS   PLAN:  PT FREQUENCY: 2x/week  PT DURATION: 6-8 weeks  PLANNED INTERVENTIONS: Therapeutic exercises, Therapeutic activity, Neuromuscular re-education, Balance training, Gait training, Patient/Family education, Self Care, Joint mobilization, Stair training, DME instructions, Aquatic Therapy, Dry Needling, Electrical stimulation, Cryotherapy, Moist heat, Taping, Ultrasound, Manual therapy, and Re-evaluation  PLAN FOR NEXT SESSION: Fall risk education; progress core/lumbopelvic and postural flexibility and strengthening; review/update HEP as needed; static and dynamic balance training   Marry Guan, PT 08/11/2022, 9:43 AM

## 2022-08-13 ENCOUNTER — Other Ambulatory Visit: Payer: Self-pay | Admitting: Family Medicine

## 2022-08-13 ENCOUNTER — Ambulatory Visit: Payer: Medicare Other

## 2022-08-13 DIAGNOSIS — R262 Difficulty in walking, not elsewhere classified: Secondary | ICD-10-CM

## 2022-08-13 DIAGNOSIS — R2681 Unsteadiness on feet: Secondary | ICD-10-CM

## 2022-08-13 DIAGNOSIS — M6281 Muscle weakness (generalized): Secondary | ICD-10-CM | POA: Diagnosis not present

## 2022-08-13 NOTE — Therapy (Signed)
OUTPATIENT PHYSICAL THERAPY TREATMENT   Patient Name: Noah Lane MRN: [id number] DOB:05/26/1955, 67 y.o., male Today's Date: 08/13/2022   END OF SESSION:  PT End of Session - 08/13/22 0908     Visit Number 4    Date for PT Re-Evaluation 09/28/22    Authorization Type [employer]    PT Start Time 0845    PT Stop Time 0940    PT Time Calculation (min) 55 min    Activity Tolerance Patient tolerated treatment well    Behavior During Therapy WFL for tasks assessed/performed                Past Medical History:  Diagnosis Date   Diabetes mellitus type 2 in obese    Mixed hyperlipidemia    Past Surgical History:  Procedure Laterality Date   COLON RESECTION N/A 04/27/2022   Procedure: LAPAROSCOPIC HAND ASSISTED RIGHT HEMI COLECTOMY, POSSIBLE OPEN;  Surgeon: Fritzi Mandes, MD;  Location: [facility];  Service: General;  Laterality: N/A;   COLONOSCOPY     HERNIA REPAIR  2012   Patient Active Problem List   Diagnosis Date Noted   Elevated blood pressure reading 05/04/2022   Colon adenocarcinoma (HCC) 05/04/2022   Gastroesophageal reflux disease 04/29/2022   Ileus, postoperative (HCC) 04/29/2022   Hypokalemia 04/28/2022   Hyperlipidemia 04/28/2022   Colonic obstruction (HCC) 04/25/2022   Hyponatremia 04/25/2022   OSA on CPAP 12/03/2021   Obesity (BMI 30.0-34.9) 12/03/2021   Mixed hyperlipidemia 10/20/2021   Strain of lumbar region 10/16/2021   Strain of calf muscle 07/22/2021   Patellofemoral pain syndrome of both knees 02/24/2021   Rotator cuff tendinitis, right 12/28/2019   Hip pain, bilateral 05/28/2019   Chronic bilateral low back pain without sciatica 05/28/2019   Diabetes mellitus type 2 in obese 05/28/2019   Erectile dysfunction 05/28/2019   Pain and swelling of left knee 03/12/2019   Lateral epicondylitis, right elbow 03/15/2017    PCP: Sharlene Dory, DO   REFERRING PROVIDER: Sharlene Dory, DO  REFERRING DIAG:  R53.81  (ICD-10-CM) - Physical deconditioning  R26.89 (ICD-10-CM) - Balance disorder   THERAPY DIAG:  Muscle weakness (generalized)  Unsteadiness on feet  Difficulty in walking, not elsewhere classified  RATIONALE FOR EVALUATION AND TREATMENT: Rehabilitation  ONSET DATE: 04/27/22 - laparoscopic R hemi colectomy for colon cancer  NEXT MD VISIT: TBD   SUBJECTIVE:  SUBJECTIVE STATEMENT: Pt reports he is stiff this morning, even after walking and stretching. Not really having pain today.  PAIN: Are you having pain? No  PERTINENT HISTORY:  DM-II, elevated BP, GERD, OSA, chronic LBP, h/o R RTC tendinitis, B patellofemoral pain syndrome, B hip pain  PRECAUTIONS: None  WEIGHT BEARING RESTRICTIONS: No  FALLS:  Has patient fallen in last 6 months? 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  COGNITION: Overall cognitive status: History of cognitive impairments - at baseline - mostly short-term memory recall   SENSATION: WFL Intermittent numbness and tingling in soles of B feet - most common upon  getting up out of a chair  MUSCLE LENGTH: Hamstrings: mod tight L, mild tight R ITB: mod tight L, mild tight R Piriformis: mild tight L>R Hip flexors: mild tight L>R Quads: mild tight L>R Heelcord: NT  POSTURE:  No Significant postural limitations  LOWER EXTREMITY ROM:    Grossly WFL other than limitations due to tightness as above  LOWER EXTREMITY MMT:    MMT Right Eval Left Eval  Hip flexion 4- 4  Hip extension 4- 4-  Hip abduction 4- 4-  Hip adduction 4- 4-  Hip internal rotation 3+ 4-  Hip external rotation 3+ 3+  Knee flexion 4+ 4  Knee extension 4+ 4+  Ankle dorsiflexion 4+ 4+  Ankle plantarflexion    Ankle inversion    Ankle eversion    (Blank rows = not tested)  BED MOBILITY:  Sit to supine Modified independence Supine to sit Modified independence Rolling to Right SBA  TRANSFERS: Assistive device utilized: None  Sit to stand: Complete Independence Stand to sit: Complete Independence Chair to chair: Complete Independence Floor:  NT  GAIT: Distance walked: 80 ft Assistive device utilized: None Level of assistance: Complete Independence Gait pattern: step through pattern, increased lateral sway Comments: Gait speed = 4.22 ft/sec  RAMP: Level of Assistance:  NT Assistive device utilized:  NT Ramp Comments:   CURB:  Level of Assistance:  NT Assistive device utilized:  NT Curb Comments:   STAIRS: (08/11/22)  Level of Assistance: Modified independence Stair Negotiation Technique: Alternating Pattern  Forwards with Single Rail on Left Number of Stairs: 14  Height of Stairs: 7"  Comments: Good reciprocal pattern but definite need for railing for balance/support  FUNCTIONAL TESTS:  5 times sit to stand: 16.54 sec Timed up and go (TUG): 7.81 sec 10 meter walk test: 7.78 sec Berg Balance Scale: (08/11/22) 51/56, 46-51 moderate risk for falls (>50%)  Functional gait assessment: (08/11/22) 24/30, 19-24 = medium risk fall   PATIENT SURVEYS:  ABC  scale 1190 / 1600 = 74.4 %   TODAY'S TREATMENT:  08/13/22 THERAPEUTIC EXERCISE: to improve flexibility, strength and mobility.  Demonstration, verbal and tactile cues  throughout for technique. Rec Bike L3x16min Side steps along the counter green tb at knees 5x  Standing hip abd + ext x 10 bil green TB at knees Standing hip abduction to ext x 10 green TB at knees Squats with green TB 2x10  Single leg deadlift x 10  Standing shoulder ext 20# 2x10 Seated rows 35# 2x10 low grip Knee extension 20# 2x10 Knee flexion 25# 2x10 Supine trunk roations on orange ball x 10  Supine hip ADD stretch 3x20 seconds Supine hip ER/IR x 10   08/11/22 THERAPEUTIC EXERCISE: to improve flexibility, strength and mobility.  Demonstration, verbal and tactile cues throughout for technique.  Rec bike - L2 x 5:30 min Standing GTB rows in staggered stance for increased core/abdominal muscle activation 10 x 5", 2 sets Standing GTB scap retraction + shoulder extension to neutral in staggered stance for increased core/abdominal muscle activation 10 x 5", 2 sets Standing GTB lat pulldown in staggered stance for increased core/abdominal muscle activation 10 x 5", 2 sets  THERAPEUTIC ACTIVITIES: Berg: 51/56, 46-51 moderate risk for falls (>50%)  FGA: 24/30, 19-24 = medium risk fall    08/06/22 Therapeutic Exercise: to improve strength and mobility.  Demo, verbal and tactile cues throughout for technique.  Bike L2x16min Seated hamstring stretch 2x30 sec bil Supine bridge 2x10 Supine LTR x 10 each direction Supine quad stretch x 30 sec reviewed Standing functional squat x 10  Heel raises 2x10 Marching 2x10  08/03/22 Initial eval only   PATIENT EDUCATION:  Education details: PT eval findings and HEP update - Standing rows/retraction/pulldown with core engagement   Person educated: Patient Education method: Explanation, Demonstration, Verbal cues, Handouts, and MedBridge[employer] access code email/texted to patient; HEP  tracking log included in printouts Education comprehension: verbalized understanding, returned demonstration, and needs further education  HOME EXERCISE PROGRAM: *Access Code: [email or web address] URL: [email or web address] Date: 08/13/2022 Prepared by: Verta Ellen  Exercises - Seated Hamstring Stretch  - 1 x daily - 7 x weekly - 3 sets - 30 sec hold - Supine Quadriceps Stretch with Strap on Table  - 1 x daily - 7 x weekly - 3 sets - 30 sec hold - Supine Bridge  - 1 x daily - 7 x weekly - 3 sets - 10 reps - Squat with Counter Support  - 1 x daily - 7 x weekly - 3 sets - 10 reps - Split Stance Shoulder Row with Resistance  - 1 x daily - 7 x weekly - 2 sets - 10 reps - 5 sec hold - Scapular Retraction with Resistance Advanced  - 1 x daily - 7 x weekly - 2 sets - 10 reps - 5 sec hold - Staggered Lat Pull Down with Resistance - Elbows Bent  - 1 x daily - 7 x weekly - 2 sets - 10 reps - 5 sec hold - Standing 3-Way Leg Reach with Resistance at Ankles and Counter Support  - 1 x daily - 7 x weekly - 3 sets - 10 reps - Forward T with Counter Support  - 1 x daily - 7 x weekly - 3 sets - 10 reps  *MedBridge[employer] access code email/texted to patient   ASSESSMENT:  CLINICAL IMPRESSION: Progressed with LE strengthening exercises to improve functional strength and stability necessary for daily activities. Pt wanted to progress HEP more so we added exercises that were more challenging. Cueing required for posture with standing hip exercises as well as eccentric control. Cues needed to avoid rotating pelvis with the deadlifts.  We reviewed his HEP along with fall prevention strategies at home while also providing a handout   OBJECTIVE IMPAIRMENTS: decreased activity tolerance, decreased balance, decreased knowledge of condition, decreased mobility, difficulty walking, decreased strength, decreased safety awareness, impaired perceived functional ability, impaired flexibility, and impaired sensation.    ACTIVITY LIMITATIONS: carrying, lifting, bending, standing, squatting, stairs, transfers, bed mobility, locomotion level, and caring for others  PARTICIPATION LIMITATIONS: meal prep, cleaning, laundry, community activity, and occupation  PERSONAL FACTORS: Fitness, Past/current experiences, Time since onset of injury/illness/exacerbation, and 3+ comorbidities: DM-II, elevated BP, GERD, OSA, chronic LBP, h/o R RTC tendinitis, B patellofemoral pain syndrome, B hip pain  are also affecting patient's functional outcome.   REHAB POTENTIAL: Good  CLINICAL DECISION MAKING: Stable/uncomplicated  EVALUATION COMPLEXITY: Low   GOALS: Goals reviewed with patient? Yes  SHORT TERM GOALS: Target date: 08/31/2022   Patient will be independent with initial HEP to improve outcomes and carryover.  Baseline:  Goal status: MET- 08/13/22  2.  Patient will be educated on strategies to decrease risk of falls.  Baseline:  Goal status: MET- 08/13/22  3.  Patient will demonstrate decreased 5xSTS time to </= 12 sec to decrease risk for falls with transitional mobility. Baseline: 16.54 sec Goal status: IN PROGRESS  LONG TERM GOALS: Target date: 09/28/2022   Patient will be independent with advanced/ongoing HEP to facilitate ability to maintain/progress functional gains from skilled physical therapy services. Baseline:  Goal status: IN PROGRESS  2.  Patient will demonstrate improved B LE strength to >/= 4 to 4+/5 for improved stability and ease of mobility . Baseline: Refer to above MMT table Goal status: IN PROGRESS  3.  Patient will be report ability to ambulate for >/= 30 min w/o AD on variable surfaces with good safety to access community.  Baseline:  Goal status: IN PROGRESS  4.  Patient will be able to step up/down curb safely w/o AD for safety with community ambulation.  Baseline:  Goal status: IN PROGRESS   5.  Patient will report ability to navigate stairs to second floor of home without  limitation due to fatigue by end of day. Baseline:  Goal status: IN PROGRESS   6.  Patient will improve Berg score to >/= 54/56 to improve safety and stability with ADLs in standing and reduce risk for falls.  Baseline: 51/56 Goal status: REVISED  08/11/22  7. Patient will improve FGA score to at least 28/30 to improve gait stability and reduce risk for falls. Baseline: 24/30 Goal status: REVISED  08/11/22  10.  Patient will report >/= 90% balance confidence on ABC scale to demonstrate improved functional ability. Baseline: 1190 / 1600 = 74.4 % Goal status: IN PROGRESS   PLAN:  PT FREQUENCY: 2x/week  PT DURATION: 6-8 weeks  PLANNED INTERVENTIONS: Therapeutic exercises, Therapeutic activity, Neuromuscular re-education, Balance training, Gait training, Patient/Family education, Self Care, Joint mobilization, Stair training, DME instructions, Aquatic Therapy, Dry Needling, Electrical stimulation, Cryotherapy, Moist heat, Taping, Ultrasound, Manual therapy, and Re-evaluation  PLAN FOR NEXT SESSION: progress core/lumbopelvic and postural flexibility and strengthening; review/update HEP as needed; static and dynamic balance training   Darleene Cleaver, PTA 08/13/2022, 9:50 AM

## 2022-08-16 ENCOUNTER — Other Ambulatory Visit (HOSPITAL_BASED_OUTPATIENT_CLINIC_OR_DEPARTMENT_OTHER): Payer: Medicare Other

## 2022-08-16 ENCOUNTER — Ambulatory Visit (INDEPENDENT_AMBULATORY_CARE_PROVIDER_SITE_OTHER): Payer: Medicare Other | Admitting: Family Medicine

## 2022-08-16 VITALS — BP 122/72 | HR 75 | Temp 99.0°F | Ht 73.0 in | Wt 249.0 lb

## 2022-08-16 DIAGNOSIS — R202 Paresthesia of skin: Secondary | ICD-10-CM | POA: Diagnosis not present

## 2022-08-16 DIAGNOSIS — R5383 Other fatigue: Secondary | ICD-10-CM | POA: Diagnosis not present

## 2022-08-16 DIAGNOSIS — E1169 Type 2 diabetes mellitus with other specified complication: Secondary | ICD-10-CM

## 2022-08-16 DIAGNOSIS — Z7984 Long term (current) use of oral hypoglycemic drugs: Secondary | ICD-10-CM

## 2022-08-16 DIAGNOSIS — E669 Obesity, unspecified: Secondary | ICD-10-CM

## 2022-08-16 LAB — CBC
HCT: 42.4 % (ref 39.0–52.0)
Hemoglobin: 13.9 g/dL (ref 13.0–17.0)
MCHC: 32.8 g/dL (ref 30.0–36.0)
MCV: 84.5 fl (ref 78.0–100.0)
Platelets: 159 10*3/uL (ref 150.0–400.0)
RBC: 5.02 Mil/uL (ref 4.22–5.81)
RDW: 15.1 % (ref 11.5–15.5)
WBC: 8.1 10*3/uL (ref 4.0–10.5)

## 2022-08-16 LAB — VITAMIN B12: Vitamin B-12: 179 pg/mL — ABNORMAL LOW (ref 211–911)

## 2022-08-16 LAB — COMPREHENSIVE METABOLIC PANEL
ALT: 15 U/L (ref 0–53)
AST: 14 U/L (ref 0–37)
Albumin: 4.5 g/dL (ref 3.5–5.2)
Alkaline Phosphatase: 43 U/L (ref 39–117)
BUN: 30 mg/dL — ABNORMAL HIGH (ref 6–23)
CO2: 25 mEq/L (ref 19–32)
Calcium: 9 mg/dL (ref 8.4–10.5)
Chloride: 106 mEq/L (ref 96–112)
Creatinine, Ser: 1.26 mg/dL (ref 0.40–1.50)
GFR: 58.6 mL/min — ABNORMAL LOW (ref >60.00)
Glucose, Bld: 153 mg/dL — ABNORMAL HIGH (ref 70–99)
Potassium: 4.5 mEq/L (ref 3.5–5.1)
Sodium: 138 mEq/L (ref 135–145)
Total Bilirubin: 0.9 mg/dL (ref 0.2–1.2)
Total Protein: 6.9 g/dL (ref 6.0–8.3)

## 2022-08-16 LAB — T4, FREE: Free T4: 0.62 ng/dL (ref 0.60–1.60)

## 2022-08-16 LAB — TSH: TSH: 4.59 u[IU]/mL (ref 0.35–5.50)

## 2022-08-16 LAB — MAGNESIUM: Magnesium: 1.7 mg/dL (ref 1.5–2.5)

## 2022-08-16 LAB — PHOSPHORUS: Phosphorus: 3.5 mg/dL (ref 2.3–4.6)

## 2022-08-16 NOTE — Addendum Note (Signed)
Addended by: Mervin Kung A on: 08/16/2022 07:59 AM   Modules accepted: Orders

## 2022-08-16 NOTE — Patient Instructions (Signed)
Give [place] 2-3 business days to get the results of your labs back.   Keep the diet clean and stay active.  Continue working with the physical therapy team.   Let us know if you need anything.

## 2022-08-16 NOTE — Progress Notes (Signed)
Chief Complaint  Patient presents with   Extremity Weakness    Subjective: Patient is a 67 y.o. male here for f/u.  Around 3.5 months ago, the patient had a hospitalization due to anemia and colon cancer.  He had a right hemicolectomy and since then has been dealing with endurance issues.  He is seeing physical therapy but has only had 3 sessions. Has some tingling in his legs. Sugars have been better since going back on Ozempic.   Past Medical History:  Diagnosis Date   Diabetes mellitus type 2 in obese    Mixed hyperlipidemia     Objective: BP 122/72 (BP Location: Left Arm, Patient Position: Sitting, Cuff Size: Large)   Pulse 75   Temp 99 F (37.2 C) (Oral)   Ht 6\' 1"  (1.854 m)   Wt 249 lb (112.9 kg)   SpO2 97%   BMI 32.85 kg/m  General: Awake, appears stated age Lungs: No accessory muscle use Neuro: DTR's equal and symmetric throughout LE's, no clonus, no cerebellar signs, 5/5 strength throughout MSK: No muscle atrophy Psych: Age appropriate judgment and insight, normal affect and mood  Assessment and Plan: Fatigue, unspecified type - Plan: CBC, T4, free, TSH  Paresthesias - Plan: B12  Type 2 diabetes mellitus with obesity (HCC) - Plan: Phosphorus, Comprehensive metabolic panel, Magnesium  This could be related to his major medical event a few months ago.  Will check the above labs to ensure nothing metabolic is contributing.  He will continue working with physical therapy.  I do not think he has had enough sessions where we can say it has failed.  Encouraged compliance with home exercise program. Check labs.  Could be related to the diabetes as he has waxed and waned with adequate sugar control.  Sounds like he is in a good spot currently.  We will follow-up and see how he is doing with his sugars and the above it about 6 weeks. The patient voiced understanding and agreement to the plan.  I spent 30 min w pt discussing the above plans in addition to reviewing his chart  on the same day of the visit.   Jilda Roche Knox, DO 08/16/22  4:52 PM

## 2022-08-17 ENCOUNTER — Telehealth: Payer: Self-pay | Admitting: Family Medicine

## 2022-08-17 ENCOUNTER — Other Ambulatory Visit: Payer: Self-pay | Admitting: Family Medicine

## 2022-08-17 ENCOUNTER — Ambulatory Visit: Payer: Medicare Other | Admitting: Physical Therapy

## 2022-08-17 ENCOUNTER — Other Ambulatory Visit: Payer: Medicare Other

## 2022-08-17 ENCOUNTER — Ambulatory Visit: Payer: Medicare Other

## 2022-08-17 DIAGNOSIS — M6281 Muscle weakness (generalized): Secondary | ICD-10-CM

## 2022-08-17 DIAGNOSIS — R202 Paresthesia of skin: Secondary | ICD-10-CM

## 2022-08-17 DIAGNOSIS — R2681 Unsteadiness on feet: Secondary | ICD-10-CM

## 2022-08-17 DIAGNOSIS — R262 Difficulty in walking, not elsewhere classified: Secondary | ICD-10-CM

## 2022-08-17 NOTE — Telephone Encounter (Signed)
Please return pt phone call regarding labs

## 2022-08-17 NOTE — Telephone Encounter (Signed)
See result notes. 

## 2022-08-17 NOTE — Therapy (Signed)
OUTPATIENT PHYSICAL THERAPY TREATMENT   Patient Name: Noah Lane MRN: [id number] DOB:04/02/1955, 67 y.o., male Today's Date: 08/17/2022   END OF SESSION:  PT End of Session - 08/17/22 0850     Visit Number 5    Date for PT Re-Evaluation 09/28/22    Authorization Type [employer]    PT Start Time 0850    PT Stop Time 0932    PT Time Calculation (min) 42 min    Activity Tolerance Patient tolerated treatment well    Behavior During Therapy WFL for tasks assessed/performed                 Past Medical History:  Diagnosis Date   Diabetes mellitus type 2 in obese    Mixed hyperlipidemia    Past Surgical History:  Procedure Laterality Date   COLON RESECTION N/A 04/27/2022   Procedure: LAPAROSCOPIC HAND ASSISTED RIGHT HEMI COLECTOMY, POSSIBLE OPEN;  Surgeon: Fritzi Mandes, MD;  Location: [facility];  Service: General;  Laterality: N/A;   COLONOSCOPY     HERNIA REPAIR  2012   Patient Active Problem List   Diagnosis Date Noted   Elevated blood pressure reading 05/04/2022   Colon adenocarcinoma (HCC) 05/04/2022   Gastroesophageal reflux disease 04/29/2022   Ileus, postoperative (HCC) 04/29/2022   Hypokalemia 04/28/2022   Hyperlipidemia 04/28/2022   Colonic obstruction (HCC) 04/25/2022   Hyponatremia 04/25/2022   OSA on CPAP 12/03/2021   Obesity (BMI 30.0-34.9) 12/03/2021   Mixed hyperlipidemia 10/20/2021   Strain of lumbar region 10/16/2021   Strain of calf muscle 07/22/2021   Patellofemoral pain syndrome of both knees 02/24/2021   Rotator cuff tendinitis, right 12/28/2019   Hip pain, bilateral 05/28/2019   Chronic bilateral low back pain without sciatica 05/28/2019   Diabetes mellitus type 2 in obese 05/28/2019   Erectile dysfunction 05/28/2019   Pain and swelling of left knee 03/12/2019   Lateral epicondylitis, right elbow 03/15/2017    PCP: Sharlene Dory, DO   REFERRING PROVIDER: Sharlene Dory, DO  REFERRING DIAG:  R53.81  (ICD-10-CM) - Physical deconditioning  R26.89 (ICD-10-CM) - Balance disorder   THERAPY DIAG:  Muscle weakness (generalized)  Unsteadiness on feet  Difficulty in walking, not elsewhere classified  RATIONALE FOR EVALUATION AND TREATMENT: Rehabilitation  ONSET DATE: 04/27/22 - laparoscopic R hemi colectomy for colon cancer  NEXT MD VISIT: TBD   SUBJECTIVE:  SUBJECTIVE STATEMENT: Pt reports he went back to Dr. Carmelia Roller yesterday due to ongoing weakness, stiffness and numbness in his lower body. MD told him that this should continue to improve over time, but is running some tests.  PAIN: Are you having pain? No  PERTINENT HISTORY:  DM-II, elevated BP, GERD, OSA, chronic LBP, h/o R RTC tendinitis, B patellofemoral pain syndrome, B hip pain  PRECAUTIONS: None  WEIGHT BEARING RESTRICTIONS: No  FALLS:  Has patient fallen in last 6 months? 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  COGNITION: Overall cognitive status: History of cognitive impairments - at baseline - mostly short-term memory  recall   SENSATION: WFL Intermittent numbness and tingling in soles of B feet - most common upon getting up out of a chair  MUSCLE LENGTH: Hamstrings: mod tight L, mild tight R ITB: mod tight L, mild tight R Piriformis: mild tight L>R Hip flexors: mild tight L>R Quads: mild tight L>R Heelcord: NT  POSTURE:  No Significant postural limitations  LOWER EXTREMITY ROM:    Grossly WFL other than limitations due to tightness as above  LOWER EXTREMITY MMT:    MMT Right Eval Left Eval  Hip flexion 4- 4  Hip extension 4- 4-  Hip abduction 4- 4-  Hip adduction 4- 4-  Hip internal rotation 3+ 4-  Hip external rotation 3+ 3+  Knee flexion 4+ 4  Knee extension 4+ 4+  Ankle dorsiflexion 4+ 4+  Ankle plantarflexion    Ankle inversion    Ankle eversion    (Blank rows = not tested)  BED MOBILITY:  Sit to supine Modified independence Supine to sit Modified independence Rolling to Right SBA  TRANSFERS: Assistive device utilized: None  Sit to stand: Complete Independence Stand to sit: Complete Independence Chair to chair: Complete Independence Floor:  NT  GAIT: Distance walked: 80 ft Assistive device utilized: None Level of assistance: Complete Independence Gait pattern: step through pattern, increased lateral sway Comments: Gait speed = 4.22 ft/sec  RAMP: Level of Assistance:  NT Assistive device utilized:  NT Ramp Comments:   CURB:  Level of Assistance:  NT Assistive device utilized:  NT Curb Comments:   STAIRS: (08/11/22)  Level of Assistance: Modified independence Stair Negotiation Technique: Alternating Pattern  Forwards with Single Rail on Left Number of Stairs: 14  Height of Stairs: 7"  Comments: Good reciprocal pattern but definite need for railing for balance/support  FUNCTIONAL TESTS:  5 times sit to stand: 16.54 sec Timed up and go (TUG): 7.81 sec 10 meter walk test: 7.78 sec Berg Balance Scale: (08/11/22) 51/56, 46-51 moderate risk for falls (>50%)   Functional gait assessment: (08/11/22) 24/30, 19-24 = medium risk fall   PATIENT SURVEYS:  ABC scale 1190 / 1600 = 74.4 %  TODAY'S TREATMENT:   08/17/22 THERAPEUTIC EXERCISE: to improve flexibility, strength and mobility.  Demonstration, verbal and tactile cues throughout for technique. Rec Bike L3 x 6 min  NEUROMUSCULAR RE-EDUCATION: To improve posture, balance, proprioception, coordination, and reduce fall risk. Corner balance progression with narrow BOS on firm surface with arms crossed on chest            Eyes open: - static stance x 30 sec - horiz head turns x 5 - vertical head nods x 5 - trunk rotation x 5 Eyes closed: - static stance x 30 sec - horiz head turns x 5 - vertical head nods x 5 - trunk rotation x 1 Corner balance progression with narrow BOS on Airex pad with arms crossed on chest            Eyes open: - static stance x 30 sec - horiz head turns x 5 - vertical head nods x 5 - trunk rotation x 5 Eyes closed: - static stance x 20 sec - horiz head turns x 3 Tandem stance at counter x 30" with each foot fwd Tandem gait fwd & back x 10 ft with intermittent UE support on counter and SBA/CGA of PT   08/13/22 THERAPEUTIC EXERCISE: to improve flexibility, strength and mobility.  Demonstration, verbal and tactile cues throughout for technique. Rec Bike L3x24min Side steps along the counter green tb at knees 5x  Standing hip abd + ext x 10 bil green TB at knees Standing hip abduction to ext x 10 green TB at knees Squats with green TB 2x10  Single leg deadlift x 10  Standing shoulder ext 20# 2x10 Seated rows 35# 2x10 low grip Knee extension 20# 2x10 Knee flexion 25# 2x10 Supine trunk roations on orange ball x 10  Supine hip ADD stretch 3x20 seconds Supine hip ER/IR x 10    08/11/22 THERAPEUTIC EXERCISE: to improve flexibility, strength and mobility.  Demonstration, verbal and tactile cues throughout for technique.  Rec bike - L2 x 5:30 min Standing GTB  rows in staggered stance for increased core/abdominal muscle activation 10 x 5", 2 sets Standing GTB scap retraction + shoulder extension to neutral in staggered stance for increased core/abdominal muscle activation 10 x 5", 2 sets Standing GTB lat pulldown in staggered stance for increased core/abdominal muscle activation 10 x 5", 2 sets  THERAPEUTIC ACTIVITIES: Berg: 51/56, 46-51 moderate risk for falls (>50%)  FGA: 24/30, 19-24 = medium risk fall    PATIENT EDUCATION:  Education details: HEP update - corner balance progression   Person educated: Patient Education method: Explanation, Demonstration, Verbal cues, Handouts, and MedBridgeGO access code updated Education comprehension: verbalized understanding, returned demonstration, and needs further education  HOME EXERCISE PROGRAM: *Access Code: [email or web address] URL: [email or web address] Date: 08/17/2022 Prepared by: Glenetta Hew  Exercises - Seated Hamstring Stretch  - 1 x daily - 7 x weekly - 3 sets - 30 sec hold - Supine Quadriceps Stretch with Strap on Table  - 1 x daily - 7 x weekly - 3 sets - 30 sec hold - Supine Bridge  - 1 x daily - 7 x weekly - 3 sets - 10 reps - Squat with Counter Support  - 1 x daily - 7 x weekly - 3 sets - 10 reps - Split Stance Shoulder Row with Resistance  - 1 x daily - 7 x weekly - 2 sets - 10 reps - 5 sec hold - Scapular Retraction with Resistance Advanced  - 1 x daily - 7 x  weekly - 2 sets - 10 reps - 5 sec hold - Staggered Lat Pull Down with Resistance - Elbows Bent  - 1 x daily - 7 x weekly - 2 sets - 10 reps - 5 sec hold - Standing 3-Way Leg Reach with Resistance at Ankles and Counter Support  - 1 x daily - 7 x weekly - 3 sets - 10 reps - Forward T with Counter Support  - 1 x daily - 7 x weekly - 3 sets - 10 reps - Corner Balance Feet Together With Eyes Open  - 1 x daily - 7 x weekly - 3 reps - 30 sec hold - Corner Balance Feet Together: Eyes Open With Head Turns  - 1 x daily - 7 x weekly  - 2 sets - 5 reps - Corner Balance Feet Together With Eyes Closed  - 1 x daily - 7 x weekly - 2 sets - 10 reps - 3 sec hold - Corner Balance Feet Together: Eyes Closed With Head Turns  - 1 x daily - 7 x weekly - 2 sets - 10 reps - 3 sec hold  *MedBridgeGO access code email/texted to patient   ASSESSMENT:  CLINICAL IMPRESSION: Ahmire notes better awareness of abdominal muscle engagement during everyday activities.  He reports HEP is going well and denied need for review today.  Introduced Hospital doctor activities starting with corner balance training for safety.  Patient able to progress to unstable surfaces with supervision of PT but unsafe to perform at home without PT supervision due to multiple LOB.  HEP instructions provided for corner balance progression on solid surfaces.  Will continue to introduce more dynamic balance activities in upcoming visits.  OBJECTIVE IMPAIRMENTS: decreased activity tolerance, decreased balance, decreased knowledge of condition, decreased mobility, difficulty walking, decreased strength, decreased safety awareness, impaired perceived functional ability, impaired flexibility, and impaired sensation.   ACTIVITY LIMITATIONS: carrying, lifting, bending, standing, squatting, stairs, transfers, bed mobility, locomotion level, and caring for others  PARTICIPATION LIMITATIONS: meal prep, cleaning, laundry, community activity, and occupation  PERSONAL FACTORS: Fitness, Past/current experiences, Time since onset of injury/illness/exacerbation, and 3+ comorbidities: DM-II, elevated BP, GERD, OSA, chronic LBP, h/o R RTC tendinitis, B patellofemoral pain syndrome, B hip pain  are also affecting patient's functional outcome.   REHAB POTENTIAL: Good  CLINICAL DECISION MAKING: Stable/uncomplicated  EVALUATION COMPLEXITY: Low   GOALS: Goals reviewed with patient? Yes  SHORT TERM GOALS: Target date: 08/31/2022   Patient will be independent with initial HEP to improve  outcomes and carryover.  Baseline:  Goal status: MET  08/13/22  2.  Patient will be educated on strategies to decrease risk of falls.  Baseline:  Goal status: MET  08/13/22  3.  Patient will demonstrate decreased 5xSTS time to </= 12 sec to decrease risk for falls with transitional mobility. Baseline: 16.54 sec Goal status: IN PROGRESS  LONG TERM GOALS: Target date: 09/28/2022   Patient will be independent with advanced/ongoing HEP to facilitate ability to maintain/progress functional gains from skilled physical therapy services. Baseline:  Goal status: IN PROGRESS  2.  Patient will demonstrate improved B LE strength to >/= 4 to 4+/5 for improved stability and ease of mobility . Baseline: Refer to above MMT table Goal status: IN PROGRESS  3.  Patient will be report ability to ambulate for >/= 30 min w/o AD on variable surfaces with good safety to access community.  Baseline:  Goal status: IN PROGRESS  4.  Patient will be able to step  up/down curb safely w/o AD for safety with community ambulation.  Baseline:  Goal status: IN PROGRESS   5.  Patient will report ability to navigate stairs to second floor of home without limitation due to fatigue by end of day. Baseline:  Goal status: IN PROGRESS   6.  Patient will improve Berg score to >/= 54/56 to improve safety and stability with ADLs in standing and reduce risk for falls.  Baseline: 51/56 Goal status: REVISED  08/11/22  7. Patient will improve FGA score to at least 28/30 to improve gait stability and reduce risk for falls. Baseline: 24/30 Goal status: REVISED  08/11/22  10.  Patient will report >/= 90% balance confidence on ABC scale to demonstrate improved functional ability. Baseline: 1190 / 1600 = 74.4 % Goal status: IN PROGRESS   PLAN:  PT FREQUENCY: 2x/week  PT DURATION: 6-8 weeks  PLANNED INTERVENTIONS: Therapeutic exercises, Therapeutic activity, Neuromuscular re-education, Balance training, Gait training,  Patient/Family education, Self Care, Joint mobilization, Stair training, DME instructions, Aquatic Therapy, Dry Needling, Electrical stimulation, Cryotherapy, Moist heat, Taping, Ultrasound, Manual therapy, and Re-evaluation  PLAN FOR NEXT SESSION: progress core/lumbopelvic and postural flexibility and strengthening; review/update HEP as needed; static and dynamic balance training   Marry Guan, PT 08/17/2022, 12:48 PM

## 2022-08-20 ENCOUNTER — Ambulatory Visit: Payer: Medicare Other

## 2022-08-20 DIAGNOSIS — R2681 Unsteadiness on feet: Secondary | ICD-10-CM

## 2022-08-20 DIAGNOSIS — M6281 Muscle weakness (generalized): Secondary | ICD-10-CM | POA: Diagnosis not present

## 2022-08-20 DIAGNOSIS — R262 Difficulty in walking, not elsewhere classified: Secondary | ICD-10-CM

## 2022-08-20 NOTE — Therapy (Signed)
OUTPATIENT PHYSICAL THERAPY TREATMENT   Patient Name: Noah Lane MRN: [id number] DOB:1955-12-17, 67 y.o., male Today's Date: 08/20/2022   END OF SESSION:  PT End of Session - 08/20/22 0901     Visit Number 6    Date for PT Re-Evaluation 09/28/22    Authorization Type [employer]    PT Start Time [phone]   pt late   PT Stop Time 0930    PT Time Calculation (min) 33 min    Activity Tolerance Patient tolerated treatment well    Behavior During Therapy WFL for tasks assessed/performed                  Past Medical History:  Diagnosis Date   Diabetes mellitus type 2 in obese    Mixed hyperlipidemia    Past Surgical History:  Procedure Laterality Date   COLON RESECTION N/A 04/27/2022   Procedure: LAPAROSCOPIC HAND ASSISTED RIGHT HEMI COLECTOMY, POSSIBLE OPEN;  Surgeon: Fritzi Mandes, MD;  Location: [facility];  Service: General;  Laterality: N/A;   COLONOSCOPY     HERNIA REPAIR  2012   Patient Active Problem List   Diagnosis Date Noted   Elevated blood pressure reading 05/04/2022   Colon adenocarcinoma (HCC) 05/04/2022   Gastroesophageal reflux disease 04/29/2022   Ileus, postoperative (HCC) 04/29/2022   Hypokalemia 04/28/2022   Hyperlipidemia 04/28/2022   Colonic obstruction (HCC) 04/25/2022   Hyponatremia 04/25/2022   OSA on CPAP 12/03/2021   Obesity (BMI 30.0-34.9) 12/03/2021   Mixed hyperlipidemia 10/20/2021   Strain of lumbar region 10/16/2021   Strain of calf muscle 07/22/2021   Patellofemoral pain syndrome of both knees 02/24/2021   Rotator cuff tendinitis, right 12/28/2019   Hip pain, bilateral 05/28/2019   Chronic bilateral low back pain without sciatica 05/28/2019   Diabetes mellitus type 2 in obese 05/28/2019   Erectile dysfunction 05/28/2019   Pain and swelling of left knee 03/12/2019   Lateral epicondylitis, right elbow 03/15/2017    PCP: Sharlene Dory, DO   REFERRING PROVIDER: Sharlene Dory, DO  REFERRING DIAG:   R53.81 (ICD-10-CM) - Physical deconditioning  R26.89 (ICD-10-CM) - Balance disorder   THERAPY DIAG:  Muscle weakness (generalized)  Unsteadiness on feet  Difficulty in walking, not elsewhere classified  RATIONALE FOR EVALUATION AND TREATMENT: Rehabilitation  ONSET DATE: 04/27/22 - laparoscopic R hemi colectomy for colon cancer  NEXT MD VISIT: TBD   SUBJECTIVE:  SUBJECTIVE STATEMENT: Running late today, stiffness in both legs.  PAIN: Are you having pain? No  PERTINENT HISTORY:  DM-II, elevated BP, GERD, OSA, chronic LBP, h/o R RTC tendinitis, B patellofemoral pain syndrome, B hip pain  PRECAUTIONS: None  WEIGHT BEARING RESTRICTIONS: No  FALLS:  Has patient fallen in last 6 months? 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  COGNITION: Overall cognitive status: History of cognitive impairments - at baseline - mostly short-term memory recall   SENSATION: WFL Intermittent numbness and tingling in soles of B feet - most common upon getting up out of a chair  MUSCLE  LENGTH: Hamstrings: mod tight L, mild tight R ITB: mod tight L, mild tight R Piriformis: mild tight L>R Hip flexors: mild tight L>R Quads: mild tight L>R Heelcord: NT  POSTURE:  No Significant postural limitations  LOWER EXTREMITY ROM:    Grossly WFL other than limitations due to tightness as above  LOWER EXTREMITY MMT:    MMT Right Eval Left Eval  Hip flexion 4- 4  Hip extension 4- 4-  Hip abduction 4- 4-  Hip adduction 4- 4-  Hip internal rotation 3+ 4-  Hip external rotation 3+ 3+  Knee flexion 4+ 4  Knee extension 4+ 4+  Ankle dorsiflexion 4+ 4+  Ankle plantarflexion    Ankle inversion    Ankle eversion    (Blank rows = not tested)  BED MOBILITY:  Sit to supine Modified independence Supine to sit Modified independence Rolling to Right SBA  TRANSFERS: Assistive device utilized: None  Sit to stand: Complete Independence Stand to sit: Complete Independence Chair to chair: Complete Independence Floor:  NT  GAIT: Distance walked: 80 ft Assistive device utilized: None Level of assistance: Complete Independence Gait pattern: step through pattern, increased lateral sway Comments: Gait speed = 4.22 ft/sec  RAMP: Level of Assistance:  NT Assistive device utilized:  NT Ramp Comments:   CURB:  Level of Assistance:  NT Assistive device utilized:  NT Curb Comments:   STAIRS: (08/11/22)  Level of Assistance: Modified independence Stair Negotiation Technique: Alternating Pattern  Forwards with Single Rail on Left Number of Stairs: 14  Height of Stairs: 7"  Comments: Good reciprocal pattern but definite need for railing for balance/support  FUNCTIONAL TESTS:  5 times sit to stand: 16.54 sec; 08/20/22 - 15 sec Timed up and go (TUG): 7.81 sec 10 meter walk test: 7.78 sec Berg Balance Scale: (08/11/22) 51/56, 46-51 moderate risk for falls (>50%)  Functional gait assessment: (08/11/22) 24/30, 19-24 = medium risk fall   PATIENT SURVEYS:  ABC scale 1190 / 1600 =  74.4 %   TODAY'S TREATMENT:  08/20/22 THERAPEUTIC EXERCISE: to improve flexibility, strength and mobility.  Demonstration, verbal and tactile cues throughout for technique. 5xSTS- 15 seconds  Single leg RDL x 10 Step then lunge R/L x 10 Clock balance 4x R/L  Leg press 25# 2x10 R/L Knee extension 15lb 2x10   08/17/22 THERAPEUTIC EXERCISE: to improve flexibility, strength and mobility.  Demonstration, verbal and tactile cues throughout for technique. Rec Bike L3 x 6 min  NEUROMUSCULAR RE-EDUCATION: To improve posture, balance, proprioception, coordination, and reduce fall risk. Corner balance progression with narrow BOS on firm surface with arms crossed on chest            Eyes open: - static stance x 30 sec - horiz head turns x 5 - vertical head nods x 5 - trunk rotation x 5 Eyes closed: - static stance x 30 sec - horiz head turns x 5 - vertical head nods x 5 - trunk rotation x 1 Corner balance progression with narrow BOS on Airex pad with arms crossed on chest            Eyes open: - static stance x 30 sec - horiz head turns x 5 - vertical head nods x 5 - trunk rotation x 5 Eyes closed: - static stance x 20 sec - horiz head turns x 3 Tandem stance at counter x 30" with each foot fwd Tandem gait fwd & back x 10 ft with intermittent UE support on counter and SBA/CGA of PT   08/13/22 THERAPEUTIC EXERCISE: to improve flexibility, strength and mobility.  Demonstration, verbal and tactile cues throughout for technique. Rec Bike L3x65min Side steps along the counter green tb at knees 5x  Standing hip abd + ext x 10 bil green TB at knees Standing hip abduction to ext x 10 green TB at knees Squats with green TB 2x10  Single leg deadlift x 10  Standing shoulder ext 20# 2x10 Seated rows 35# 2x10 low grip Knee extension 20# 2x10 Knee flexion 25# 2x10 Supine trunk roations on orange ball x 10  Supine hip ADD stretch 3x20 seconds Supine hip ER/IR x 10    08/11/22 THERAPEUTIC  EXERCISE: to improve flexibility, strength and mobility.  Demonstration, verbal and tactile cues throughout for technique.  Rec bike - L2 x 5:30 min Standing GTB rows in staggered stance for increased core/abdominal muscle activation 10 x 5", 2 sets Standing GTB scap retraction + shoulder extension to neutral in staggered stance for increased core/abdominal muscle activation 10 x 5", 2 sets Standing GTB lat pulldown in staggered stance for increased core/abdominal muscle activation 10 x 5", 2 sets  THERAPEUTIC ACTIVITIES: Berg: 51/56, 46-51 moderate risk for falls (>50%)  FGA: 24/30, 19-24 = medium risk fall    PATIENT EDUCATION:  Education details: HEP update - corner balance progression   Person educated: Patient Education method: Explanation, Demonstration, Verbal cues, Handouts, and MedBridgeGO access code updated Education comprehension: verbalized understanding, returned demonstration, and needs further education  HOME EXERCISE PROGRAM: *Access Code: [email or web address] URL: [email or web address] Date: 08/17/2022 Prepared by: Glenetta Hew  Exercises - Seated Hamstring Stretch  - 1 x daily - 7 x weekly - 3 sets - 30 sec hold - Supine Quadriceps Stretch with Strap on Table  - 1 x daily - 7 x weekly - 3 sets - 30 sec hold - Supine Bridge  - 1 x daily - 7 x weekly - 3 sets - 10 reps - Squat with Counter Support  - 1 x daily - 7 x weekly - 3 sets - 10 reps - Split Stance Shoulder Row with Resistance  - 1 x daily - 7 x weekly -  2 sets - 10 reps - 5 sec hold - Scapular Retraction with Resistance Advanced  - 1 x daily - 7 x weekly - 2 sets - 10 reps - 5 sec hold - Staggered Lat Pull Down with Resistance - Elbows Bent  - 1 x daily - 7 x weekly - 2 sets - 10 reps - 5 sec hold - Standing 3-Way Leg Reach with Resistance at Ankles and Counter Support  - 1 x daily - 7 x weekly - 3 sets - 10 reps - Forward T with Counter Support  - 1 x daily - 7 x weekly - 3 sets - 10 reps - Corner Balance  Feet Together With Eyes Open  - 1 x daily - 7 x weekly - 3 reps - 30 sec hold - Corner Balance Feet Together: Eyes Open With Head Turns  - 1 x daily - 7 x weekly - 2 sets - 5 reps - Corner Balance Feet Together With Eyes Closed  - 1 x daily - 7 x weekly - 2 sets - 10 reps - 3 sec hold - Corner Balance Feet Together: Eyes Closed With Head Turns  - 1 x daily - 7 x weekly - 2 sets - 10 reps - 3 sec hold  *MedBridgeGO access code email/texted to patient   ASSESSMENT:  CLINICAL IMPRESSION: Pt is making progress. He improved 5xSTS score by 1 point today. Continued progressing balance combined with strengthening. He showed LOB when crossing his leg over and stepping with balance exercises. Pt was 13 min late so session was limited.   OBJECTIVE IMPAIRMENTS: decreased activity tolerance, decreased balance, decreased knowledge of condition, decreased mobility, difficulty walking, decreased strength, decreased safety awareness, impaired perceived functional ability, impaired flexibility, and impaired sensation.   ACTIVITY LIMITATIONS: carrying, lifting, bending, standing, squatting, stairs, transfers, bed mobility, locomotion level, and caring for others  PARTICIPATION LIMITATIONS: meal prep, cleaning, laundry, community activity, and occupation  PERSONAL FACTORS: Fitness, Past/current experiences, Time since onset of injury/illness/exacerbation, and 3+ comorbidities: DM-II, elevated BP, GERD, OSA, chronic LBP, h/o R RTC tendinitis, B patellofemoral pain syndrome, B hip pain  are also affecting patient's functional outcome.   REHAB POTENTIAL: Good  CLINICAL DECISION MAKING: Stable/uncomplicated  EVALUATION COMPLEXITY: Low   GOALS: Goals reviewed with patient? Yes  SHORT TERM GOALS: Target date: 08/31/2022   Patient will be independent with initial HEP to improve outcomes and carryover.  Baseline:  Goal status: MET  08/13/22  2.  Patient will be educated on strategies to decrease risk of falls.   Baseline:  Goal status: MET  08/13/22  3.  Patient will demonstrate decreased 5xSTS time to </= 12 sec to decrease risk for falls with transitional mobility. Baseline: 16.54 sec Goal status: IN PROGRESS- 08/20/22 15 secs  LONG TERM GOALS: Target date: 09/28/2022   Patient will be independent with advanced/ongoing HEP to facilitate ability to maintain/progress functional gains from skilled physical therapy services. Baseline:  Goal status: IN PROGRESS  2.  Patient will demonstrate improved B LE strength to >/= 4 to 4+/5 for improved stability and ease of mobility . Baseline: Refer to above MMT table Goal status: IN PROGRESS  3.  Patient will be report ability to ambulate for >/= 30 min w/o AD on variable surfaces with good safety to access community.  Baseline:  Goal status: IN PROGRESS  4.  Patient will be able to step up/down curb safely w/o AD for safety with community ambulation.  Baseline:  Goal status: IN PROGRESS  5.  Patient will report ability to navigate stairs to second floor of home without limitation due to fatigue by end of day. Baseline:  Goal status: IN PROGRESS   6.  Patient will improve Berg score to >/= 54/56 to improve safety and stability with ADLs in standing and reduce risk for falls.  Baseline: 51/56 Goal status: REVISED  08/11/22  7. Patient will improve FGA score to at least 28/30 to improve gait stability and reduce risk for falls. Baseline: 24/30 Goal status: REVISED  08/11/22  10.  Patient will report >/= 90% balance confidence on ABC scale to demonstrate improved functional ability. Baseline: 1190 / 1600 = 74.4 % Goal status: IN PROGRESS   PLAN:  PT FREQUENCY: 2x/week  PT DURATION: 6-8 weeks  PLANNED INTERVENTIONS: Therapeutic exercises, Therapeutic activity, Neuromuscular re-education, Balance training, Gait training, Patient/Family education, Self Care, Joint mobilization, Stair training, DME instructions, Aquatic Therapy, Dry Needling,  Electrical stimulation, Cryotherapy, Moist heat, Taping, Ultrasound, Manual therapy, and Re-evaluation  PLAN FOR NEXT SESSION: progress core/lumbopelvic and postural flexibility and strengthening; review/update HEP as needed; static and dynamic balance training   Darleene Cleaver, PTA 08/20/2022, 9:31 AM

## 2022-08-21 LAB — INTRINSIC FACTOR ANTIBODIES: Intrinsic Factor: NEGATIVE

## 2022-08-23 ENCOUNTER — Ambulatory Visit (INDEPENDENT_AMBULATORY_CARE_PROVIDER_SITE_OTHER): Payer: Medicare Other | Admitting: Behavioral Health

## 2022-08-23 ENCOUNTER — Other Ambulatory Visit: Payer: Self-pay | Admitting: Family Medicine

## 2022-08-23 DIAGNOSIS — E669 Obesity, unspecified: Secondary | ICD-10-CM

## 2022-08-23 DIAGNOSIS — F4323 Adjustment disorder with mixed anxiety and depressed mood: Secondary | ICD-10-CM

## 2022-08-23 NOTE — Progress Notes (Signed)
[facility] Counselor/Therapist Progress Note  Patient ID: Noah Lane, MRN: [id number],    Date: 08/23/2022  Time Spent: 55 min In Person @ [facility]   Time In: 9:03am Time Out: 9:58am Pt provided verbal consent for telehealth services & is aware of limitations of telephone or video visits.  Treatment Type: Individual Therapy  Reported Symptoms: Elevated anx/dep since major health status changes w/the occurrence of Colon Cancer & resulting impact on overall health  Mental Status Exam: Appearance:  Casual     Behavior: Appropriate and Sharing  Motor: Normal  Speech/Language:  Clear and Coherent  Affect: Appropriate  Mood: normal  Thought process: normal  Thought content:   WNL  Sensory/Perceptual disturbances:   WNL  Orientation: oriented to person, place, time/date, and situation  Attention: Good  Concentration: Good  Memory: WNL  Fund of knowledge:  Good  Insight:   Good  Judgment:  Good  Impulse Control: Good   Risk Assessment: Danger to Self:  No Self-injurious Behavior: No Danger to Others: No Duty to Warn:no Physical Aggression / Violence:No  Access to Firearms a concern: No ; Pt is a strong anti-gun advocate Gang Involvement:No   Subjective: Pt is upbeat today & discussing his recovery from major surgery. His mental & Px recovery are coming along slowly & he is trying to be patient. He is acutely aware of the Anti-Semitism existing in the local environment. His dual membership in local Temples have exp'd 8 bomb threats since the Oct 2023 conflict in [place].   He is also concerned for his Wife Noah Lane health. Her inability to lose weight will impact the amt he does around the home; both inside & out. She is fragile Px'ly due to need for knee replacement & the pain this incurs. She is also fragile mentally due to the situation they find themselves in their current home due to her Ex-Husb who is nowhere to be found &  the situation w/his name on the Deed. Pt is worried for the loss of Equity & its capitalization.   These current chronic stressors are taking a toll. He is striving to be emotionally flexible w/his Wife & letting more things go that really do not matter.   Interventions: Humanistic/Existential and Insight-Oriented  Diagnosis:Adjustment reaction with anxiety and depression  Plan: Noah Lane is considering if he wants to invite his Wife into a session of psychotherapy. He will cont to make efforts to reduce his intake of News & this may reduce his worry more. He is using different exercises for his brain to keep himself sharp. He will make concerted efforts to write in his Notebook btwn sessions.  Target Date: 09/28/2022  Progress: 5  Frequency: Once every 3-4 wks  Modality: Claretta Fraise, LMFT

## 2022-08-23 NOTE — Progress Notes (Signed)
                Abdallah Hern L Aliviyah Malanga, LMFT 

## 2022-08-24 ENCOUNTER — Ambulatory Visit: Payer: Medicare Other | Admitting: Physical Therapy

## 2022-08-24 ENCOUNTER — Other Ambulatory Visit: Payer: Self-pay | Admitting: Family Medicine

## 2022-08-24 DIAGNOSIS — M6281 Muscle weakness (generalized): Secondary | ICD-10-CM | POA: Diagnosis not present

## 2022-08-24 DIAGNOSIS — E1169 Type 2 diabetes mellitus with other specified complication: Secondary | ICD-10-CM

## 2022-08-24 DIAGNOSIS — R262 Difficulty in walking, not elsewhere classified: Secondary | ICD-10-CM

## 2022-08-24 DIAGNOSIS — R2681 Unsteadiness on feet: Secondary | ICD-10-CM

## 2022-08-24 NOTE — Therapy (Signed)
OUTPATIENT PHYSICAL THERAPY TREATMENT   Patient Name: Noah Lane MRN: [id number] DOB:[birth year], 67 y.o., male Today's Date: 08/24/2022   END OF SESSION:  PT End of Session - 08/24/22 0850     Visit Number 7    Date for PT Re-Evaluation 09/28/22    Authorization Type [employer]    PT Start Time 0850    PT Stop Time 0932    PT Time Calculation (min) 42 min    Activity Tolerance Patient tolerated treatment well    Behavior During Therapy WFL for tasks assessed/performed                   Past Medical History:  Diagnosis Date   Diabetes mellitus type 2 in obese    Mixed hyperlipidemia    Past Surgical History:  Procedure Laterality Date   COLON RESECTION N/A 04/27/2022   Procedure: LAPAROSCOPIC HAND ASSISTED RIGHT HEMI COLECTOMY, POSSIBLE OPEN;  Surgeon: Fritzi Mandes, MD;  Location: [facility];  Service: General;  Laterality: N/A;   COLONOSCOPY     HERNIA REPAIR  2012   Patient Active Problem List   Diagnosis Date Noted   Elevated blood pressure reading 05/04/2022   Colon adenocarcinoma (HCC) 05/04/2022   Gastroesophageal reflux disease 04/29/2022   Ileus, postoperative (HCC) 04/29/2022   Hypokalemia 04/28/2022   Hyperlipidemia 04/28/2022   Colonic obstruction (HCC) 04/25/2022   Hyponatremia 04/25/2022   OSA on CPAP 12/03/2021   Obesity (BMI 30.0-34.9) 12/03/2021   Mixed hyperlipidemia 10/20/2021   Strain of lumbar region 10/16/2021   Strain of calf muscle 07/22/2021   Patellofemoral pain syndrome of both knees 02/24/2021   Rotator cuff tendinitis, right 12/28/2019   Hip pain, bilateral 05/28/2019   Chronic bilateral low back pain without sciatica 05/28/2019   Diabetes mellitus type 2 in obese 05/28/2019   Erectile dysfunction 05/28/2019   Pain and swelling of left knee 03/12/2019   Lateral epicondylitis, right elbow 03/15/2017    PCP: Sharlene Dory, DO   REFERRING PROVIDER: Sharlene Dory, DO  REFERRING DIAG:  R53.81  (ICD-10-CM) - Physical deconditioning  R26.89 (ICD-10-CM) - Balance disorder   THERAPY DIAG:  Muscle weakness (generalized)  Unsteadiness on feet  Difficulty in walking, not elsewhere classified  RATIONALE FOR EVALUATION AND TREATMENT: Rehabilitation  ONSET DATE: 04/27/22 - laparoscopic R hemi colectomy for colon cancer  NEXT MD VISIT: TBD   SUBJECTIVE:  SUBJECTIVE STATEMENT: Pt reports he is already warmed up from walking this morning and declined warm-up today.  PAIN: Are you having pain? No  PERTINENT HISTORY:  DM-II, elevated BP, GERD, OSA, chronic LBP, h/o R RTC tendinitis, B patellofemoral pain syndrome, B hip pain  PRECAUTIONS: None  WEIGHT BEARING RESTRICTIONS: No  FALLS:  Has patient fallen in last 6 months? 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  COGNITION: Overall cognitive status: History of cognitive impairments - at baseline - mostly short-term memory recall   SENSATION: WFL Intermittent numbness and tingling in soles of B feet - most common upon getting up out  of a chair  MUSCLE LENGTH: Hamstrings: mod tight L, mild tight R ITB: mod tight L, mild tight R Piriformis: mild tight L>R Hip flexors: mild tight L>R Quads: mild tight L>R Heelcord: NT  POSTURE:  No Significant postural limitations  LOWER EXTREMITY ROM:    Grossly WFL other than limitations due to tightness as above  LOWER EXTREMITY MMT:    MMT Right Eval Left Eval  Hip flexion 4- 4  Hip extension 4- 4-  Hip abduction 4- 4-  Hip adduction 4- 4-  Hip internal rotation 3+ 4-  Hip external rotation 3+ 3+  Knee flexion 4+ 4  Knee extension 4+ 4+  Ankle dorsiflexion 4+ 4+  Ankle plantarflexion    Ankle inversion    Ankle eversion    (Blank rows = not tested)  BED MOBILITY:  Sit to supine Modified independence Supine to sit Modified independence Rolling to Right SBA  TRANSFERS: Assistive device utilized: None  Sit to stand: Complete Independence Stand to sit: Complete Independence Chair to chair: Complete Independence Floor:  NT  GAIT: Distance walked: 80 ft Assistive device utilized: None Level of assistance: Complete Independence Gait pattern: step through pattern, increased lateral sway Comments: Gait speed = 4.22 ft/sec  RAMP: Level of Assistance:  NT Assistive device utilized:  NT Ramp Comments:   CURB:  Level of Assistance:  NT Assistive device utilized:  NT Curb Comments:   STAIRS: (08/11/22)  Level of Assistance: Modified independence Stair Negotiation Technique: Alternating Pattern  Forwards with Single Rail on Left Number of Stairs: 14  Height of Stairs: 7"  Comments: Good reciprocal pattern but definite need for railing for balance/support  FUNCTIONAL TESTS:  5 times sit to stand: 16.54 sec; 08/20/22 - 15 sec Timed up and go (TUG): 7.81 sec 10 meter walk test: 7.78 sec Berg Balance Scale: (08/11/22) 51/56, 46-51 moderate risk for falls (>50%)  Functional gait assessment: (08/11/22) 24/30, 19-24 = medium risk fall   PATIENT SURVEYS:   ABC scale 1190 / 1600 = 74.4 %   TODAY'S TREATMENT:   08/24/22 NEUROMUSCULAR RE-EDUCATION: To improve balance, proprioception, coordination, and reduce fall risk.  SLS + 8-way clocks x 5 bil, x 5 bil with slider Alt fwd step + crossbody reach to cone atop 36" FR x 10 Alt fwd step to Airex pad + crossbody reach to cone atop 36" FR x 10 Alt toe clears from Airex pad to 9" step 2 x 10 Cone toe tap knock down & righting + side-step between cones x 5 bil  THERAPEUTIC EXERCISE: to improve flexibility, strength and mobility.  Demonstration, verbal and tactile cues throughout for technique. Fitter (1 black/1 blue) R/L hip ABD x 20, intermittent 2 pole A for balance Fitter (1 black/1 blue) R/L hip extension x 20, 2 pole A for balance GTB pallof press x 20 bil   08/20/22 THERAPEUTIC EXERCISE: to improve flexibility, strength and mobility.  Demonstration, verbal and tactile cues throughout for technique. 5xSTS- 15 seconds Single leg RDL x 10 Step then lunge R/L x 10 Clock balance 4x R/L  Leg press 25# 2x10 R/L Knee extension 15lb 2x10    08/17/22 THERAPEUTIC EXERCISE: to improve flexibility, strength and mobility.  Demonstration, verbal and tactile cues throughout for technique. Rec Bike L3 x 6 min  NEUROMUSCULAR RE-EDUCATION: To improve posture, balance, proprioception, coordination, and reduce fall risk. Corner balance progression with narrow BOS on firm surface with arms crossed on chest            Eyes open: - static stance x 30 sec - horiz head turns x 5 - vertical head nods x 5 - trunk rotation x 5 Eyes closed: - static stance x 30 sec - horiz head turns x 5 - vertical head nods x 5 - trunk rotation x 1 Corner balance progression with narrow BOS on Airex pad with arms crossed on chest            Eyes open: - static stance x 30 sec - horiz head turns x 5 - vertical head nods x 5 - trunk rotation x 5 Eyes closed: - static stance x 20 sec - horiz head turns x 3 Tandem  stance at counter x 30" with each foot fwd Tandem gait fwd & back x 10 ft with intermittent UE support on counter and SBA/CGA of PT   PATIENT EDUCATION:  Education details: HEP update - corner balance progression   Person educated: Patient Education method: Explanation, Demonstration, Verbal cues, Handouts, and MedBridgeGO access code updated Education comprehension: verbalized understanding, returned demonstration, and needs further education  HOME EXERCISE PROGRAM: *Access Code: [email or web address] URL: [email or web address] Date: 08/17/2022 Prepared by: Glenetta Hew  Exercises - Seated Hamstring Stretch  - 1 x daily - 7 x weekly - 3 sets - 30 sec hold - Supine Quadriceps Stretch with Strap on Table  - 1 x daily - 7 x weekly - 3 sets - 30 sec hold - Supine Bridge  - 1 x daily - 7 x weekly - 3 sets - 10 reps - Squat with Counter Support  - 1 x daily - 7 x weekly - 3 sets - 10 reps - Split Stance Shoulder Row with Resistance  - 1 x daily - 7 x weekly - 2 sets - 10 reps - 5 sec hold - Scapular Retraction with Resistance Advanced  - 1 x daily - 7 x weekly - 2 sets - 10 reps - 5 sec hold - Staggered Lat Pull Down with Resistance - Elbows Bent  - 1 x daily - 7 x weekly - 2 sets - 10 reps - 5 sec hold - Standing 3-Way Leg Reach  with Resistance at Ankles and Counter Support  - 1 x daily - 7 x weekly - 3 sets - 10 reps - Forward T with Counter Support  - 1 x daily - 7 x weekly - 3 sets - 10 reps - Corner Balance Feet Together With Eyes Open  - 1 x daily - 7 x weekly - 3 reps - 30 sec hold - Corner Balance Feet Together: Eyes Open With Head Turns  - 1 x daily - 7 x weekly - 2 sets - 5 reps - Corner Balance Feet Together With Eyes Closed  - 1 x daily - 7 x weekly - 2 sets - 10 reps - 3 sec hold - Corner Balance Feet Together: Eyes Closed With Head Turns  - 1 x daily - 7 x weekly - 2 sets - 10 reps - 3 sec hold  *MedBridgeGO access code email/texted to patient   ASSESSMENT:  CLINICAL  IMPRESSION: Nilesh notes improvement with balance activities as compared to previous visits and now is able to complete full revolution to all targets on SLS clocks, although still with extra step required at times to correct for LOB.  Continued to incorporate balance challenges into strengthening activities to improve dynamic stability.  He notes feeling of restriction with trunk and shoulder ROM necessary to complete golf swing and would like to address this in upcoming visits.  OBJECTIVE IMPAIRMENTS: decreased activity tolerance, decreased balance, decreased knowledge of condition, decreased mobility, difficulty walking, decreased strength, decreased safety awareness, impaired perceived functional ability, impaired flexibility, and impaired sensation.   ACTIVITY LIMITATIONS: carrying, lifting, bending, standing, squatting, stairs, transfers, bed mobility, locomotion level, and caring for others  PARTICIPATION LIMITATIONS: meal prep, cleaning, laundry, community activity, and occupation  PERSONAL FACTORS: Fitness, Past/current experiences, Time since onset of injury/illness/exacerbation, and 3+ comorbidities: DM-II, elevated BP, GERD, OSA, chronic LBP, h/o R RTC tendinitis, B patellofemoral pain syndrome, B hip pain  are also affecting patient's functional outcome.   REHAB POTENTIAL: Good  CLINICAL DECISION MAKING: Stable/uncomplicated  EVALUATION COMPLEXITY: Low   GOALS: Goals reviewed with patient? Yes  SHORT TERM GOALS: Target date: 08/31/2022   Patient will be independent with initial HEP to improve outcomes and carryover.  Baseline:  Goal status: MET  08/13/22  2.  Patient will be educated on strategies to decrease risk of falls.  Baseline:  Goal status: MET  08/13/22  3.  Patient will demonstrate decreased 5xSTS time to </= 12 sec to decrease risk for falls with transitional mobility. Baseline: 16.54 sec Goal status: IN PROGRESS  08/20/22 - 15 secs  LONG TERM GOALS: Target date:  09/28/2022   Patient will be independent with advanced/ongoing HEP to facilitate ability to maintain/progress functional gains from skilled physical therapy services. Baseline:  Goal status: IN PROGRESS  2.  Patient will demonstrate improved B LE strength to >/= 4 to 4+/5 for improved stability and ease of mobility . Baseline: Refer to above MMT table Goal status: IN PROGRESS  3.  Patient will be report ability to ambulate for >/= 30 min w/o AD on variable surfaces with good safety to access community.  Baseline:  Goal status: IN PROGRESS  4.  Patient will be able to step up/down curb safely w/o AD for safety with community ambulation.  Baseline:  Goal status: IN PROGRESS   5.  Patient will report ability to navigate stairs to second floor of home without limitation due to fatigue by end of day. Baseline:  Goal status: IN PROGRESS  6.  Patient will improve Berg score to >/= 54/56 to improve safety and stability with ADLs in standing and reduce risk for falls.  Baseline: 51/56 Goal status: REVISED  08/11/22  7. Patient will improve FGA score to at least 28/30 to improve gait stability and reduce risk for falls. Baseline: 24/30 Goal status: REVISED  08/11/22  10.  Patient will report >/= 90% balance confidence on ABC scale to demonstrate improved functional ability. Baseline: 1190 / 1600 = 74.4 % Goal status: IN PROGRESS   PLAN:  PT FREQUENCY: 2x/week  PT DURATION: 6-8 weeks  PLANNED INTERVENTIONS: Therapeutic exercises, Therapeutic activity, Neuromuscular re-education, Balance training, Gait training, Patient/Family education, Self Care, Joint mobilization, Stair training, DME instructions, Aquatic Therapy, Dry Needling, Electrical stimulation, Cryotherapy, Moist heat, Taping, Ultrasound, Manual therapy, and Re-evaluation  PLAN FOR NEXT SESSION: progress core/lumbopelvic and postural flexibility and strengthening; review/update HEP as needed; static and dynamic balance  training; incorporate trunk and shoulder flexibility and strengthening to improve golf swing   Marry Guan, PT 08/24/2022, 12:30 PM

## 2022-08-26 ENCOUNTER — Other Ambulatory Visit: Payer: Self-pay | Admitting: Family Medicine

## 2022-08-26 ENCOUNTER — Telehealth: Payer: Self-pay | Admitting: *Deleted

## 2022-08-26 ENCOUNTER — Inpatient Hospital Stay: Payer: Medicare Other | Attending: Hematology & Oncology

## 2022-08-26 DIAGNOSIS — Z85038 Personal history of other malignant neoplasm of large intestine: Secondary | ICD-10-CM | POA: Insufficient documentation

## 2022-08-26 DIAGNOSIS — C189 Malignant neoplasm of colon, unspecified: Secondary | ICD-10-CM

## 2022-08-26 LAB — CMP (CANCER CENTER ONLY)
ALT: 14 U/L (ref 0–44)
AST: 17 U/L (ref 15–41)
Albumin: 4.8 g/dL (ref 3.5–5.0)
Alkaline Phosphatase: 40 U/L (ref 38–126)
Anion gap: 11 (ref 5–15)
BUN: 24 mg/dL — ABNORMAL HIGH (ref 8–23)
CO2: 27 mmol/L (ref 22–32)
Calcium: 10.1 mg/dL (ref 8.9–10.3)
Chloride: 102 mmol/L (ref 98–111)
Creatinine: 1.26 mg/dL — ABNORMAL HIGH (ref 0.61–1.24)
GFR, Estimated: >60 mL/min (ref >60)
Glucose, Bld: 134 mg/dL — ABNORMAL HIGH (ref 70–99)
Potassium: 4.8 mmol/L (ref 3.5–5.1)
Sodium: 140 mmol/L (ref 135–145)
Total Bilirubin: 1.6 mg/dL — ABNORMAL HIGH (ref 0.3–1.2)
Total Protein: 7.4 g/dL (ref 6.5–8.1)

## 2022-08-26 LAB — CBC WITH DIFFERENTIAL (CANCER CENTER ONLY)
Abs Immature Granulocytes: 0.03 10*3/uL (ref 0.00–0.07)
Basophils Absolute: 0.1 10*3/uL (ref 0.0–0.1)
Basophils Relative: 1 %
Eosinophils Absolute: 0.2 10*3/uL (ref 0.0–0.5)
Eosinophils Relative: 3 %
HCT: 42.9 % (ref 39.0–52.0)
Hemoglobin: 14.2 g/dL (ref 13.0–17.0)
Immature Granulocytes: 0 %
Lymphocytes Relative: 21 %
Lymphs Abs: 1.9 10*3/uL (ref 0.7–4.0)
MCH: 27.9 pg (ref 26.0–34.0)
MCHC: 33.1 g/dL (ref 30.0–36.0)
MCV: 84.3 fL (ref 80.0–100.0)
Monocytes Absolute: 0.8 10*3/uL (ref 0.1–1.0)
Monocytes Relative: 9 %
Neutro Abs: 6 10*3/uL (ref 1.7–7.7)
Neutrophils Relative %: 66 %
Platelet Count: 172 10*3/uL (ref 150–400)
RBC: 5.09 MIL/uL (ref 4.22–5.81)
RDW: 13.8 % (ref 11.5–15.5)
WBC Count: 8.9 10*3/uL (ref 4.0–10.5)
nRBC: 0 % (ref 0.0–0.2)

## 2022-08-26 LAB — LACTATE DEHYDROGENASE: LDH: 185 U/L (ref 98–192)

## 2022-08-26 NOTE — Telephone Encounter (Signed)
Call received from patient stating that he would like to come in prior to his next visit with Dr. Myna Hidalgo on 09/13/22 for his lab work.  Pt states that his surgeon, Dr. Sophronia Simas has requested a CEA level and pt would like to have labs done prior to his appt on 09/13/22 with Dr Myna Hidalgo.  Pt instructed to come in today at 2:00PM for lab only appt. Pt is appreciative of assistance and has no further questions at this time.

## 2022-08-27 ENCOUNTER — Ambulatory Visit: Payer: Medicare Other

## 2022-08-27 DIAGNOSIS — M6281 Muscle weakness (generalized): Secondary | ICD-10-CM | POA: Diagnosis not present

## 2022-08-27 DIAGNOSIS — R2681 Unsteadiness on feet: Secondary | ICD-10-CM

## 2022-08-27 DIAGNOSIS — R262 Difficulty in walking, not elsewhere classified: Secondary | ICD-10-CM

## 2022-08-27 LAB — CEA (IN HOUSE-CHCC): CEA (CHCC-In House): <1 ng/mL (ref 0.00–5.00)

## 2022-08-27 NOTE — Therapy (Signed)
OUTPATIENT PHYSICAL THERAPY TREATMENT   Patient Name: Noah Lane MRN: [id number] DOB:25-Apr-1955, 67 y.o., male Today's Date: 08/27/2022   END OF SESSION:  PT End of Session - 08/27/22 0856     Visit Number 8    Date for PT Re-Evaluation 09/28/22    Authorization Type [employer]    PT Start Time [phone]   pt late   PT Stop Time 0931    PT Time Calculation (min) 39 min    Activity Tolerance Patient tolerated treatment well    Behavior During Therapy [facility] for tasks assessed/performed                    Past Medical History:  Diagnosis Date   Diabetes mellitus type 2 in obese    Mixed hyperlipidemia    Past Surgical History:  Procedure Laterality Date   COLON RESECTION N/A 04/27/2022   Procedure: LAPAROSCOPIC HAND ASSISTED RIGHT HEMI COLECTOMY, POSSIBLE OPEN;  Surgeon: Fritzi Mandes, MD;  Location: [facility];  Service: General;  Laterality: N/A;   COLONOSCOPY     HERNIA REPAIR  2012   Patient Active Problem List   Diagnosis Date Noted   Elevated blood pressure reading 05/04/2022   Colon adenocarcinoma (HCC) 05/04/2022   Gastroesophageal reflux disease 04/29/2022   Ileus, postoperative (HCC) 04/29/2022   Hypokalemia 04/28/2022   Hyperlipidemia 04/28/2022   Colonic obstruction (HCC) 04/25/2022   Hyponatremia 04/25/2022   OSA on CPAP 12/03/2021   Obesity (BMI 30.0-34.9) 12/03/2021   Mixed hyperlipidemia 10/20/2021   Strain of lumbar region 10/16/2021   Strain of calf muscle 07/22/2021   Patellofemoral pain syndrome of both knees 02/24/2021   Rotator cuff tendinitis, right 12/28/2019   Hip pain, bilateral 05/28/2019   Chronic bilateral low back pain without sciatica 05/28/2019   Diabetes mellitus type 2 in obese 05/28/2019   Erectile dysfunction 05/28/2019   Pain and swelling of left knee 03/12/2019   Lateral epicondylitis, right elbow 03/15/2017    PCP: Sharlene Dory, DO   REFERRING PROVIDER: Sharlene Dory, DO  REFERRING DIAG:   R53.81 (ICD-10-CM) - Physical deconditioning  R26.89 (ICD-10-CM) - Balance disorder   THERAPY DIAG:  Muscle weakness (generalized)  Unsteadiness on feet  Difficulty in walking, not elsewhere classified  RATIONALE FOR EVALUATION AND TREATMENT: Rehabilitation  ONSET DATE: 04/27/22 - laparoscopic R hemi colectomy for colon cancer  NEXT MD VISIT: TBD   SUBJECTIVE:  SUBJECTIVE STATEMENT:   PAIN: Are you having pain? No  PERTINENT HISTORY:  DM-II, elevated BP, GERD, OSA, chronic LBP, h/o R RTC tendinitis, B patellofemoral pain syndrome, B hip pain  PRECAUTIONS: None  WEIGHT BEARING RESTRICTIONS: No  FALLS:  Has patient fallen in last 6 months? 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  COGNITION: Overall cognitive status: History of cognitive impairments - at baseline - mostly short-term memory recall   SENSATION: WFL Intermittent numbness and tingling in soles of B feet - most common upon getting up out of a chair  MUSCLE LENGTH: Hamstrings: mod tight L, mild tight R ITB: mod  tight L, mild tight R Piriformis: mild tight L>R Hip flexors: mild tight L>R Quads: mild tight L>R Heelcord: NT  POSTURE:  No Significant postural limitations  LOWER EXTREMITY ROM:    Grossly WFL other than limitations due to tightness as above  LOWER EXTREMITY MMT:    MMT Right Eval Left Eval  Hip flexion 4- 4  Hip extension 4- 4-  Hip abduction 4- 4-  Hip adduction 4- 4-  Hip internal rotation 3+ 4-  Hip external rotation 3+ 3+  Knee flexion 4+ 4  Knee extension 4+ 4+  Ankle dorsiflexion 4+ 4+  Ankle plantarflexion    Ankle inversion    Ankle eversion    (Blank rows = not tested)  BED MOBILITY:  Sit to supine Modified independence Supine to sit Modified independence Rolling to Right SBA  TRANSFERS: Assistive device utilized: None  Sit to stand: Complete Independence Stand to sit: Complete Independence Chair to chair: Complete Independence Floor:  NT  GAIT: Distance walked: 80 ft Assistive device utilized: None Level of assistance: Complete Independence Gait pattern: step through pattern, increased lateral sway Comments: Gait speed = 4.22 ft/sec  RAMP: Level of Assistance:  NT Assistive device utilized:  NT Ramp Comments:   CURB:  Level of Assistance:  NT Assistive device utilized:  NT Curb Comments:   STAIRS: (08/11/22)  Level of Assistance: Modified independence Stair Negotiation Technique: Alternating Pattern  Forwards with Single Rail on Left Number of Stairs: 14  Height of Stairs: 7"  Comments: Good reciprocal pattern but definite need for railing for balance/support  FUNCTIONAL TESTS:  5 times sit to stand: 16.54 sec; 08/20/22 - 15 sec Timed up and go (TUG): 7.81 sec 10 meter walk test: 7.78 sec Berg Balance Scale: (08/11/22) 51/56, 46-51 moderate risk for falls (>50%)  Functional gait assessment: (08/11/22) 24/30, 19-24 = medium risk fall   PATIENT SURVEYS:  ABC scale 1190 / 1600 = 74.4 %   TODAY'S TREATMENT:  08/27/22 THERAPEUTIC  EXERCISE: to improve flexibility, strength and mobility.  Demonstration, verbal and tactile cues throughout for technique. Nustep L5x64min Stairs: 3x14 steps, WFL Standing marching from  airex pad 2x10 no UE support Trunk rotation from airex 2x10   Standing hip abduction x 10 no UE support Backwards stepping x 10 bil  Step ups 8' x  15 bil no UE support Leg press 25# R/L x 15  08/24/22 NEUROMUSCULAR RE-EDUCATION: To improve balance, proprioception, coordination, and reduce fall risk.  SLS + 8-way clocks x 5 bil, x 5 bil with slider Alt fwd step + crossbody reach to cone atop 36" FR x 10 Alt fwd step to Airex pad + crossbody reach to cone atop 36" FR x 10 Alt toe clears from Airex pad to 9" step 2 x 10 Cone toe tap knock down & righting + side-step between cones x 5 bil  THERAPEUTIC EXERCISE: to improve flexibility, strength and mobility.  Demonstration, verbal and tactile cues throughout for technique. Fitter (1 black/1 blue) R/L hip ABD x 20, intermittent 2 pole A for balance Fitter (1 black/1 blue) R/L hip extension x 20, 2 pole A for balance GTB pallof press x 20 bil   08/20/22 THERAPEUTIC EXERCISE: to improve flexibility, strength and mobility.  Demonstration, verbal and tactile cues throughout for technique. 5xSTS- 15 seconds Single leg RDL x 10 Step then lunge R/L x 10 Clock balance 4x R/L  Leg press 25# 2x10 R/L Knee extension 15lb 2x10    08/17/22 THERAPEUTIC EXERCISE: to improve flexibility, strength and mobility.  Demonstration, verbal and tactile cues throughout for technique. Rec Bike L3 x 6 min  NEUROMUSCULAR RE-EDUCATION: To improve posture, balance, proprioception, coordination, and reduce fall risk. Corner balance progression with narrow BOS on firm surface with arms crossed on chest            Eyes open: - static stance x 30 sec - horiz head turns x 5 - vertical head nods x 5 - trunk rotation x 5 Eyes closed: - static stance x 30 sec - horiz head turns x  5 - vertical head nods x 5 - trunk rotation x 1 Corner balance progression with narrow BOS on Airex pad with arms crossed on chest            Eyes open: - static stance x 30 sec - horiz head turns x 5 - vertical head nods x 5 - trunk rotation x 5 Eyes closed: - static stance x 20 sec - horiz head turns x 3 Tandem stance at counter x 30" with each foot fwd Tandem gait fwd & back x 10 ft with intermittent UE support on counter and SBA/CGA of PT   PATIENT EDUCATION:  Education details: HEP update - corner balance progression   Person educated: Patient Education method: Explanation, Demonstration, Verbal cues, Handouts, and MedBridgeGO access code updated Education comprehension: verbalized understanding, returned demonstration, and needs further education  HOME EXERCISE PROGRAM: *Access Code: [email or web address] URL: [email or web address] Date: 08/17/2022 Prepared by: Glenetta Hew  Exercises - Seated Hamstring Stretch  - 1 x daily - 7 x weekly - 3 sets - 30 sec hold - Supine Quadriceps Stretch with Strap on Table  - 1 x daily - 7 x weekly - 3 sets - 30 sec hold - Supine Bridge  - 1 x daily - 7 x weekly - 3 sets - 10 reps - Squat with Counter Support  - 1 x daily - 7 x weekly - 3 sets - 10 reps - Split Stance Shoulder Row with Resistance  - 1 x daily - 7 x weekly - 2 sets - 10 reps - 5 sec hold - Scapular  Retraction with Resistance Advanced  - 1 x daily - 7 x weekly - 2 sets - 10 reps - 5 sec hold - Staggered Lat Pull Down with Resistance - Elbows Bent  - 1 x daily - 7 x weekly - 2 sets - 10 reps - 5 sec hold - Standing 3-Way Leg Reach with Resistance at Ankles and Counter Support  - 1 x daily - 7 x weekly - 3 sets - 10 reps - Forward T with Counter Support  - 1 x daily - 7 x weekly - 3 sets - 10 reps - Corner Balance Feet Together With Eyes Open  - 1 x daily - 7 x weekly - 3 reps - 30 sec hold - Corner Balance Feet Together: Eyes Open With Head Turns  - 1 x daily - 7 x weekly -  2 sets - 5 reps - Corner Balance Feet Together With Eyes Closed  - 1 x daily - 7 x weekly - 2 sets - 10 reps - 3 sec hold - Corner Balance Feet Together: Eyes Closed With Head Turns  - 1 x daily - 7 x weekly - 2 sets - 10 reps - 3 sec hold  *MedBridgeGO access code email/texted to patient   ASSESSMENT:  CLINICAL IMPRESSION: Pt showed a good tolerance for exercises. Continued working on balance activities as well as LE strengthening to improve balance and stability. He demonstrates good ability to do stairs w/o tiring out today. Shows some postural sways with step ups no support and with hip strengthening with no UE support. Overall good response.  OBJECTIVE IMPAIRMENTS: decreased activity tolerance, decreased balance, decreased knowledge of condition, decreased mobility, difficulty walking, decreased strength, decreased safety awareness, impaired perceived functional ability, impaired flexibility, and impaired sensation.   ACTIVITY LIMITATIONS: carrying, lifting, bending, standing, squatting, stairs, transfers, bed mobility, locomotion level, and caring for others  PARTICIPATION LIMITATIONS: meal prep, cleaning, laundry, community activity, and occupation  PERSONAL FACTORS: Fitness, Past/current experiences, Time since onset of injury/illness/exacerbation, and 3+ comorbidities: DM-II, elevated BP, GERD, OSA, chronic LBP, h/o R RTC tendinitis, B patellofemoral pain syndrome, B hip pain  are also affecting patient's functional outcome.   REHAB POTENTIAL: Good  CLINICAL DECISION MAKING: Stable/uncomplicated  EVALUATION COMPLEXITY: Low   GOALS: Goals reviewed with patient? Yes  SHORT TERM GOALS: Target date: 08/31/2022   Patient will be independent with initial HEP to improve outcomes and carryover.  Baseline:  Goal status: MET  08/13/22  2.  Patient will be educated on strategies to decrease risk of falls.  Baseline:  Goal status: MET  08/13/22  3.  Patient will demonstrate decreased  5xSTS time to </= 12 sec to decrease risk for falls with transitional mobility. Baseline: 16.54 sec Goal status: IN PROGRESS  08/20/22 - 15 secs  LONG TERM GOALS: Target date: 09/28/2022   Patient will be independent with advanced/ongoing HEP to facilitate ability to maintain/progress functional gains from skilled physical therapy services. Baseline:  Goal status: IN PROGRESS  2.  Patient will demonstrate improved B LE strength to >/= 4 to 4+/5 for improved stability and ease of mobility . Baseline: Refer to above MMT table Goal status: IN PROGRESS  3.  Patient will be report ability to ambulate for >/= 30 min w/o AD on variable surfaces with good safety to access community.  Baseline:  Goal status: IN PROGRESS  4.  Patient will be able to step up/down curb safely w/o AD for safety with community ambulation.  Baseline:  Goal  status: IN PROGRESS   5.  Patient will report ability to navigate stairs to second floor of home without limitation due to fatigue by end of day. Baseline:  Goal status: IN PROGRESS- 08/27/22- good demo but does get tired at end  6.  Patient will improve Berg score to >/= 54/56 to improve safety and stability with ADLs in standing and reduce risk for falls.  Baseline: 51/56 Goal status: REVISED  08/11/22  7. Patient will improve FGA score to at least 28/30 to improve gait stability and reduce risk for falls. Baseline: 24/30 Goal status: REVISED  08/11/22  10.  Patient will report >/= 90% balance confidence on ABC scale to demonstrate improved functional ability. Baseline: 1190 / 1600 = 74.4 % Goal status: IN PROGRESS   PLAN:  PT FREQUENCY: 2x/week  PT DURATION: 6-8 weeks  PLANNED INTERVENTIONS: Therapeutic exercises, Therapeutic activity, Neuromuscular re-education, Balance training, Gait training, Patient/Family education, Self Care, Joint mobilization, Stair training, DME instructions, Aquatic Therapy, Dry Needling, Electrical stimulation, Cryotherapy,  Moist heat, Taping, Ultrasound, Manual therapy, and Re-evaluation  PLAN FOR NEXT SESSION: progress core/lumbopelvic and postural flexibility and strengthening; review/update HEP as needed; static and dynamic balance training; incorporate trunk and shoulder flexibility and strengthening to improve golf swing   Darleene Cleaver, PTA 08/27/2022, 9:31 AM

## 2022-08-31 ENCOUNTER — Ambulatory Visit: Payer: Medicare Other | Attending: Family Medicine | Admitting: Physical Therapy

## 2022-08-31 ENCOUNTER — Encounter: Payer: Self-pay | Admitting: Physical Therapy

## 2022-08-31 DIAGNOSIS — R262 Difficulty in walking, not elsewhere classified: Secondary | ICD-10-CM | POA: Diagnosis present

## 2022-08-31 DIAGNOSIS — R2681 Unsteadiness on feet: Secondary | ICD-10-CM | POA: Diagnosis present

## 2022-08-31 DIAGNOSIS — M6281 Muscle weakness (generalized): Secondary | ICD-10-CM | POA: Insufficient documentation

## 2022-08-31 NOTE — Therapy (Signed)
OUTPATIENT PHYSICAL THERAPY TREATMENT   Patient Name: Noah Lane MRN: [id number] DOB:[birth year], 67 y.o., male Today's Date: 08/31/2022   END OF SESSION:  PT End of Session - 08/31/22 0857     Visit Number 9    Date for PT Re-Evaluation 09/28/22    Authorization Type [employer]    PT Start Time 0857   Pt arrived late   PT Stop Time 0930    PT Time Calculation (min) 33 min    Activity Tolerance Patient tolerated treatment well    Behavior During Therapy WFL for tasks assessed/performed                     Past Medical History:  Diagnosis Date   Diabetes mellitus type 2 in obese    Mixed hyperlipidemia    Past Surgical History:  Procedure Laterality Date   COLON RESECTION N/A 04/27/2022   Procedure: LAPAROSCOPIC HAND ASSISTED RIGHT HEMI COLECTOMY, POSSIBLE OPEN;  Surgeon: Fritzi Mandes, MD;  Location: [facility];  Service: Noah;  Laterality: N/A;   COLONOSCOPY     HERNIA REPAIR  2012   Patient Active Problem List   Diagnosis Date Noted   Elevated blood pressure reading 05/04/2022   Colon adenocarcinoma (HCC) 05/04/2022   Gastroesophageal reflux disease 04/29/2022   Ileus, postoperative (HCC) 04/29/2022   Hypokalemia 04/28/2022   Hyperlipidemia 04/28/2022   Colonic obstruction (HCC) 04/25/2022   Hyponatremia 04/25/2022   OSA on CPAP 12/03/2021   Obesity (BMI 30.0-34.9) 12/03/2021   Mixed hyperlipidemia 10/20/2021   Strain of lumbar region 10/16/2021   Strain of calf muscle 07/22/2021   Patellofemoral pain syndrome of both knees 02/24/2021   Rotator cuff tendinitis, right 12/28/2019   Hip pain, bilateral 05/28/2019   Chronic bilateral low back pain without sciatica 05/28/2019   Diabetes mellitus type 2 in obese 05/28/2019   Erectile dysfunction 05/28/2019   Pain and swelling of left knee 03/12/2019   Lateral epicondylitis, right elbow 03/15/2017    PCP: Sharlene Dory, DO   REFERRING PROVIDER: Sharlene Dory,  DO  REFERRING DIAG:  R53.81 (ICD-10-CM) - Physical deconditioning  R26.89 (ICD-10-CM) - Balance disorder   THERAPY DIAG:  Muscle weakness (generalized)  Unsteadiness on feet  Difficulty in walking, not elsewhere classified  RATIONALE FOR EVALUATION AND TREATMENT: Rehabilitation  ONSET DATE: 04/27/22 - laparoscopic R hemi colectomy for colon cancer  NEXT MD VISIT: TBD   SUBJECTIVE:  SUBJECTIVE STATEMENT: Pt thought his appt was at 9:00 instead of 8:45 today. Already warmed up at the gym.  PAIN: Are you having pain? No  PERTINENT HISTORY:  DM-II, elevated BP, GERD, OSA, chronic LBP, h/o R RTC tendinitis, B patellofemoral pain syndrome, B hip pain  PRECAUTIONS: None  WEIGHT BEARING RESTRICTIONS: No  FALLS:  Has patient fallen in last 6 months? 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  COGNITION: Overall cognitive status: History of cognitive impairments - at baseline - mostly short-term memory recall   SENSATION: WFL Intermittent numbness and tingling in soles of B feet - most  common upon getting up out of a chair  MUSCLE LENGTH: Hamstrings: mod tight L, mild tight R ITB: mod tight L, mild tight R Piriformis: mild tight L>R Hip flexors: mild tight L>R Quads: mild tight L>R Heelcord: NT  POSTURE:  No Significant postural limitations  LOWER EXTREMITY ROM:    Grossly WFL other than limitations due to tightness as above  LOWER EXTREMITY MMT:    MMT Right Eval Left Eval  Hip flexion 4- 4  Hip extension 4- 4-  Hip abduction 4- 4-  Hip adduction 4- 4-  Hip internal rotation 3+ 4-  Hip external rotation 3+ 3+  Knee flexion 4+ 4  Knee extension 4+ 4+  Ankle dorsiflexion 4+ 4+  Ankle plantarflexion    Ankle inversion    Ankle eversion    (Blank rows = not tested)  BED MOBILITY:  Sit to supine Modified independence Supine to sit Modified independence Rolling to Right SBA  TRANSFERS: Assistive device utilized: None  Sit to stand: Complete Independence Stand to sit: Complete Independence Chair to chair: Complete Independence Floor:  NT  GAIT: Distance walked: 80 ft Assistive device utilized: None Level of assistance: Complete Independence Gait pattern: step through pattern, increased lateral sway Comments: Gait speed = 4.22 ft/sec  RAMP: Level of Assistance:  NT Assistive device utilized:  NT Ramp Comments:   CURB:  Level of Assistance:  NT Assistive device utilized:  NT Curb Comments:   STAIRS: (08/11/22)  Level of Assistance: Modified independence Stair Negotiation Technique: Alternating Pattern  Forwards with Single Rail on Left Number of Stairs: 14  Height of Stairs: 7"  Comments: Good reciprocal pattern but definite need for railing for balance/support  FUNCTIONAL TESTS:  5 times sit to stand: 16.54 sec; 08/20/22 - 15 sec Timed up and go (TUG): 7.81 sec 10 meter walk test: 7.78 sec Berg Balance Scale: (08/11/22) 51/56, 46-51 moderate risk for falls (>50%)  Functional gait assessment: (08/11/22) 24/30, 19-24 = medium risk  fall   PATIENT SURVEYS:  ABC scale 1190 / 1600 = 74.4 %   TODAY'S TREATMENT:   08/31/22 THERAPEUTIC EXERCISE: to improve flexibility, strength and mobility.  Demonstration, verbal and tactile cues throughout for technique.  GTB pallof press + rotation x 10 bil GTB B UE chop and lift x 10 each bil  NEUROMUSCULAR RE-EDUCATION: To improve balance, proprioception, coordination, and reduce fall risk.  SLS on blue foam oval + 4-way clocks x 5 bil with slider   08/27/22 THERAPEUTIC EXERCISE: to improve flexibility, strength and mobility.  Demonstration, verbal and tactile cues throughout for technique. Nustep L5x54min Stairs: 3x14 steps, WFL Standing marching from airex pad 2x10 no UE support Trunk rotation from airex 2x10   Standing hip abduction x 10 no UE support Backwards stepping x 10 bil  Step ups 8' x  15 bil no UE support Leg press 25# R/L x 15   08/24/22 NEUROMUSCULAR RE-EDUCATION: To improve balance, proprioception, coordination, and reduce fall risk.  SLS + 8-way clocks x 5 bil, x 5 bil with slider Alt fwd step + crossbody reach to cone atop 36" FR x 10 Alt fwd step to Airex pad + crossbody reach to cone atop 36" FR x 10 Alt toe clears from Airex pad to 9" step 2 x 10 Cone toe tap knock down & righting + side-step between cones x 5 bil  THERAPEUTIC EXERCISE: to improve flexibility, strength and mobility.  Demonstration, verbal and tactile cues throughout for technique. Fitter (1 black/1 blue) R/L hip ABD x 20, intermittent 2 pole A for balance Fitter (1 black/1 blue) R/L hip extension x 20, 2 pole A for balance GTB pallof press x 20 bil   PATIENT EDUCATION:  Education details: HEP update  Person educated: Patient Education method: Explanation, Demonstration, Verbal cues, Handouts, and MedBridgeGO access code updated Education comprehension: verbalized understanding, returned demonstration, and needs further education  HOME EXERCISE PROGRAM: *Access Code:  [email or web address] URL: [email or web address] Date: 08/31/2022 Prepared by: Glenetta Hew  Exercises - Seated Hamstring Stretch  - 1 x daily - 7 x weekly - 3 sets - 30 sec hold - Supine Quadriceps Stretch with Strap on Table  - 1 x daily - 7 x weekly - 3 sets - 30 sec hold - Supine Bridge  - 1 x daily - 7 x weekly - 3 sets - 10 reps - Squat with Counter Support  - 1 x daily - 7 x weekly - 3 sets - 10 reps - Split Stance Shoulder Row with Resistance  - 1 x daily - 7 x weekly - 2 sets - 10 reps - 5 sec hold - Scapular Retraction with Resistance Advanced  - 1 x daily - 7 x weekly - 2 sets - 10 reps - 5 sec hold - Staggered Lat Pull Down with Resistance - Elbows Bent  - 1 x daily - 7 x weekly - 2 sets - 10 reps - 5 sec hold - Standing 3-Way Leg Reach with Resistance at Ankles and Counter Support  - 1 x daily - 7 x weekly - 3 sets - 10 reps - Forward T with Counter Support  - 1 x daily - 7 x weekly - 3 sets - 10 reps - Corner Balance Feet Together With Eyes Open  - 1 x daily - 7 x weekly - 3 reps - 30 sec hold - Corner Balance Feet Together: Eyes Open With Head Turns  - 1 x daily - 7 x weekly - 2 sets - 5 reps - Corner Balance Feet Together With Eyes Closed  - 1 x daily - 7 x weekly - 2 sets - 10 reps - 3 sec hold -  Corner Balance Feet Together: Eyes Closed With Head Turns  - 1 x daily - 7 x weekly - 2 sets - 10 reps - 3 sec hold - Standing Diagonal Chop  - 1 x daily - 3-4 x weekly - 2 sets - 10 reps - 3 sec hold  *MedBridgeGO access code email/texted to patient   ASSESSMENT:  CLINICAL IMPRESSION: Noah Lane arrived late to session due to mistaken about appointment time, therefore session limited.  Therapeutic interventions focusing on core activation with rotary strength/control for golf swing as well as balance - he continues to note greatest fatigue at hips with these types of activities, but no significant LOB and denies any pain.  HEP updated at his request.  He is due for his 10th visit  progress note next visit, therefore we will plan to reassess strength and standardized balance tests to determine need for future focus of therapy.  OBJECTIVE IMPAIRMENTS: decreased activity tolerance, decreased balance, decreased knowledge of condition, decreased mobility, difficulty walking, decreased strength, decreased safety awareness, impaired perceived functional ability, impaired flexibility, and impaired sensation.   ACTIVITY LIMITATIONS: carrying, lifting, bending, standing, squatting, stairs, transfers, bed mobility, locomotion level, and caring for others  PARTICIPATION LIMITATIONS: meal prep, cleaning, laundry, community activity, and occupation  PERSONAL FACTORS: Fitness, Past/current experiences, Time since onset of injury/illness/exacerbation, and 3+ comorbidities: DM-II, elevated BP, GERD, OSA, chronic LBP, h/o R RTC tendinitis, B patellofemoral pain syndrome, B hip pain  are also affecting patient's functional outcome.   REHAB POTENTIAL: Good  CLINICAL DECISION MAKING: Stable/uncomplicated  EVALUATION COMPLEXITY: Low   GOALS: Goals reviewed with patient? Yes  SHORT TERM GOALS: Target date: 08/31/2022   Patient will be independent with initial HEP to improve outcomes and carryover.  Baseline:  Goal status: MET  08/13/22  2.  Patient will be educated on strategies to decrease risk of falls.  Baseline:  Goal status: MET  08/13/22  3.  Patient will demonstrate decreased 5xSTS time to </= 12 sec to decrease risk for falls with transitional mobility. Baseline: 16.54 sec Goal status: IN PROGRESS  08/20/22 - 15 secs  LONG TERM GOALS: Target date: 09/28/2022   Patient will be independent with advanced/ongoing HEP to facilitate ability to maintain/progress functional gains from skilled physical therapy services. Baseline:  Goal status: IN PROGRESS  2.  Patient will demonstrate improved B LE strength to >/= 4 to 4+/5 for improved stability and ease of mobility . Baseline:  Refer to above MMT table Goal status: IN PROGRESS  3.  Patient will be report ability to ambulate for >/= 30 min w/o AD on variable surfaces with good safety to access community.  Baseline:  Goal status: IN PROGRESS  4.  Patient will be able to step up/down curb safely w/o AD for safety with community ambulation.  Baseline:  Goal status: IN PROGRESS   5.  Patient will report ability to navigate stairs to second floor of home without limitation due to fatigue by end of day. Baseline:  Goal status: IN PROGRESS  08/27/22 - good demo but does get tired at end  6.  Patient will improve Berg score to >/= 54/56 to improve safety and stability with ADLs in standing and reduce risk for falls.  Baseline: 51/56 Goal status: REVISED  08/11/22  7. Patient will improve FGA score to at least 28/30 to improve gait stability and reduce risk for falls. Baseline: 24/30 Goal status: REVISED  08/11/22  10.  Patient will report >/= 90% balance confidence on ABC  scale to demonstrate improved functional ability. Baseline: 1190 / 1600 = 74.4 % Goal status: IN PROGRESS   PLAN:  PT FREQUENCY: 2x/week  PT DURATION: 6-8 weeks  PLANNED INTERVENTIONS: Therapeutic exercises, Therapeutic activity, Neuromuscular re-education, Balance training, Gait training, Patient/Family education, Self Care, Joint mobilization, Stair training, DME instructions, Aquatic Therapy, Dry Needling, Electrical stimulation, Cryotherapy, Moist heat, Taping, Ultrasound, Manual therapy, and Re-evaluation  PLAN FOR NEXT SESSION: 10th visit PN - address STG #3 & LTG's: reassess strength, 5xSTS, Berg, FGA, ABC scale; progress core/lumbopelvic and postural flexibility and strengthening; review/update HEP as needed; static and dynamic balance training; incorporate trunk and shoulder flexibility and strengthening to improve golf swing   Marry Guan, PT 08/31/2022, 9:31 AM

## 2022-09-06 ENCOUNTER — Ambulatory Visit (INDEPENDENT_AMBULATORY_CARE_PROVIDER_SITE_OTHER): Payer: Medicare Other | Admitting: Behavioral Health

## 2022-09-06 DIAGNOSIS — F4323 Adjustment disorder with mixed anxiety and depressed mood: Secondary | ICD-10-CM | POA: Diagnosis not present

## 2022-09-06 NOTE — Progress Notes (Signed)
                Byrant Valent L Saydie Gerdts, LMFT 

## 2022-09-07 ENCOUNTER — Encounter: Payer: Self-pay | Admitting: Physical Therapy

## 2022-09-07 ENCOUNTER — Ambulatory Visit: Payer: Medicare Other | Admitting: Physical Therapy

## 2022-09-07 DIAGNOSIS — R2681 Unsteadiness on feet: Secondary | ICD-10-CM

## 2022-09-07 DIAGNOSIS — M6281 Muscle weakness (generalized): Secondary | ICD-10-CM | POA: Diagnosis not present

## 2022-09-07 DIAGNOSIS — R262 Difficulty in walking, not elsewhere classified: Secondary | ICD-10-CM

## 2022-09-07 NOTE — Therapy (Signed)
OUTPATIENT PHYSICAL THERAPY TREATMENT  Progress Note  Reporting Period 08/03/2022 to 09/07/2022   See note below for Objective Data and Assessment of Progress/Goals.     Patient Name: Noah Lane MRN: [id number] DOB:10/17/1955, 67 y.o., male Today's Date: 09/07/2022   END OF SESSION:  PT End of Session - 09/07/22 0853     Visit Number 10    Date for PT Re-Evaluation 09/28/22    Authorization Type [employer]    PT Start Time 0853    PT Stop Time 0950    PT Time Calculation (min) 57 min    Activity Tolerance Patient tolerated treatment well    Behavior During Therapy [facility] for tasks assessed/performed                     Past Medical History:  Diagnosis Date   Diabetes mellitus type 2 in obese    Mixed hyperlipidemia    Past Surgical History:  Procedure Laterality Date   COLON RESECTION N/A 04/27/2022   Procedure: LAPAROSCOPIC HAND ASSISTED RIGHT HEMI COLECTOMY, POSSIBLE OPEN;  Surgeon: Fritzi Mandes, MD;  Location: [facility];  Service: General;  Laterality: N/A;   COLONOSCOPY     HERNIA REPAIR  2012   Patient Active Problem List   Diagnosis Date Noted   Elevated blood pressure reading 05/04/2022   Colon adenocarcinoma (HCC) 05/04/2022   Gastroesophageal reflux disease 04/29/2022   Ileus, postoperative (HCC) 04/29/2022   Hypokalemia 04/28/2022   Hyperlipidemia 04/28/2022   Colonic obstruction (HCC) 04/25/2022   Hyponatremia 04/25/2022   OSA on CPAP 12/03/2021   Obesity (BMI 30.0-34.9) 12/03/2021   Mixed hyperlipidemia 10/20/2021   Strain of lumbar region 10/16/2021   Strain of calf muscle 07/22/2021   Patellofemoral pain syndrome of both knees 02/24/2021   Rotator cuff tendinitis, right 12/28/2019   Hip pain, bilateral 05/28/2019   Chronic bilateral low back pain without sciatica 05/28/2019   Diabetes mellitus type 2 in obese 05/28/2019   Erectile dysfunction 05/28/2019   Pain and swelling of left knee 03/12/2019   Lateral epicondylitis, right  elbow 03/15/2017    PCP: Sharlene Dory, DO   REFERRING PROVIDER: Sharlene Dory, DO  REFERRING DIAG:  R53.81 (ICD-10-CM) - Physical deconditioning  R26.89 (ICD-10-CM) - Balance disorder   THERAPY DIAG:  Muscle weakness (generalized)  Unsteadiness on feet  Difficulty in walking, not elsewhere classified  RATIONALE FOR EVALUATION AND TREATMENT: Rehabilitation  ONSET DATE: 04/27/22 - laparoscopic R hemi colectomy for colon cancer  NEXT MD VISIT: TBD   SUBJECTIVE:  SUBJECTIVE STATEMENT: Pt reports when he sits for a while, he starts to feel numb and weaker in his LEs. He notes ability to get up and down the stairs more easily during the day.  PAIN: Are you having pain? No  PERTINENT HISTORY:  DM-II, elevated BP, GERD, OSA, chronic LBP, h/o R RTC tendinitis, B patellofemoral pain syndrome, B hip pain  PRECAUTIONS: None  WEIGHT BEARING RESTRICTIONS: No  FALLS:  Has patient fallen in last 6 months? 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  COGNITION: Overall  cognitive status: History of cognitive impairments - at baseline - mostly short-term memory recall   SENSATION: WFL Intermittent numbness and tingling in soles of B feet - most common upon getting up out of a chair  MUSCLE LENGTH: Hamstrings: mod tight L, mild tight R ITB: mod tight L, mild tight R Piriformis: mild tight L>R Hip flexors: mild tight L>R Quads: mild tight L>R Heelcord: NT  POSTURE:  No Significant postural limitations  LOWER EXTREMITY ROM:    Grossly WFL other than limitations due to tightness as above  LOWER EXTREMITY MMT:    MMT Right Eval Left Eval R 09/07/22 L 09/07/22  Hip flexion 4- 4 5 4+  Hip extension 4- 4- 4 4+  Hip abduction 4- 4- 4 4  Hip adduction 4- 4- 4+ 4+  Hip internal rotation 3+ 4- 4+ 4  Hip external rotation 3+ 3+ 4+ 4+  Knee flexion 4+ 4 5 4+  Knee extension 4+ 4+ 5 5  Ankle dorsiflexion 4+ 4+ 5 5  Ankle plantarflexion   5 5  Ankle inversion      Ankle eversion      (Blank rows = not tested)  BED MOBILITY:  Sit to supine Modified independence Supine to sit Modified independence Rolling to Right SBA  TRANSFERS: Assistive device utilized: None  Sit to stand: Complete Independence Stand to sit: Complete Independence Chair to chair: Complete Independence Floor:  NT  GAIT: Distance walked: 80 ft Assistive device utilized: None Level of assistance: Complete Independence Gait pattern: step through pattern, increased lateral sway Comments: Gait speed = 4.22 ft/sec  RAMP: Level of Assistance:  NT Assistive device utilized:  NT Ramp Comments:   CURB:  Level of Assistance:  NT Assistive device utilized:  NT Curb Comments:   STAIRS: (08/11/22)  Level of Assistance: Modified independence Stair Negotiation Technique: Alternating Pattern  Forwards with Single Rail on Left Number of Stairs: 14  Height of Stairs: 7"  Comments: Good reciprocal pattern but definite need for railing for balance/support  FUNCTIONAL TESTS:  5 times  sit to stand: 16.54 sec; 08/20/22 - 15 sec Timed up and go (TUG): 7.81 sec 10 meter walk test: 7.78 sec Berg Balance Scale: (08/11/22) 51/56, 46-51 moderate  risk for falls (>50%)  Functional gait assessment: (08/11/22) 24/30, 19-24 = medium risk fall   PATIENT SURVEYS:  ABC scale 1190 / 1600 = 74.4 %   TODAY'S TREATMENT:   09/07/22 THERAPEUTIC EXERCISE: to improve flexibility, strength and mobility.  Demonstration, verbal and tactile cues throughout for technique.  Rec Bike - L3 x 7 min  THERAPEUTIC ACTIVITIES: LE MMT 5xSTS = 10.56 sec Berg = 54/56, 52-55 lower risk for falls (> 25%)  = 6.00 sec Gait speed = 5.47 ft/sec FGA = 26/30, 25-28 = low risk fall    08/31/22 THERAPEUTIC EXERCISE: to improve flexibility, strength and mobility.  Demonstration, verbal and tactile cues throughout for technique.  GTB pallof press + rotation x 10 bil GTB B UE chop and lift x 10 each bil  NEUROMUSCULAR RE-EDUCATION: To improve balance, proprioception, coordination, and reduce fall risk.  SLS on blue foam oval + 4-way clocks x 5 bil with slider   08/27/22 THERAPEUTIC EXERCISE: to improve flexibility, strength and mobility.  Demonstration, verbal and tactile cues throughout for technique. Nustep L5x52min Stairs: 3x14 steps, WFL Standing marching from airex pad 2x10 no UE support Trunk rotation from airex 2x10   Standing hip abduction x 10 no UE support Backwards stepping x 10 bil  Step ups 8' x  15 bil no UE support Leg press 25# R/L x 15   PATIENT EDUCATION:  Education details: HEP update  Person educated: Patient Education method: Explanation, Demonstration, Verbal cues, Handouts, and MedBridgeGO access code updated Education comprehension: verbalized understanding, returned demonstration, and needs further education  HOME EXERCISE PROGRAM: *Access Code: [email or web address] URL: [email or web address] Date: 08/31/2022 Prepared by: Glenetta Hew  Exercises - Seated Hamstring  Stretch  - 1 x daily - 7 x weekly - 3 sets - 30 sec hold - Supine Quadriceps Stretch with Strap on Table  - 1 x daily - 7 x weekly - 3 sets - 30 sec hold - Supine Bridge  - 1 x daily - 7 x weekly - 3 sets - 10 reps - Squat with Counter Support  - 1 x daily - 7 x weekly - 3 sets - 10 reps - Split Stance Shoulder Row with Resistance  - 1 x daily - 7 x weekly - 2 sets - 10 reps - 5 sec hold - Scapular Retraction with Resistance Advanced  - 1 x daily - 7 x weekly - 2 sets - 10 reps - 5 sec hold - Staggered Lat Pull Down with Resistance - Elbows Bent  - 1 x daily - 7 x weekly - 2 sets - 10 reps - 5 sec hold - Standing 3-Way Leg Reach with Resistance at Ankles and Counter Support  - 1 x daily - 7 x weekly - 3 sets - 10 reps - Forward T with Counter Support  - 1 x daily - 7 x weekly - 3 sets - 10 reps - Corner Balance Feet Together With Eyes Open  - 1 x daily - 7 x weekly - 3 reps - 30 sec hold - Corner Balance Feet Together: Eyes Open With Head Turns  - 1 x daily - 7 x weekly - 2 sets - 5 reps - Corner Balance Feet Together With Eyes Closed  - 1 x daily - 7 x weekly - 2 sets - 10 reps - 3 sec hold - Corner Balance Feet Together: Eyes Closed With Head Turns  - 1 x daily - 7 x weekly - 2 sets - 10 reps -  3 sec hold - Standing Diagonal Chop  - 1 x daily - 3-4 x weekly - 2 sets - 10 reps - 3 sec hold  *MedBridgeGO access code email/texted to patient   ASSESSMENT:  CLINICAL IMPRESSION: Bradford is demonstrating good progress with PT with gains noted in LE muscle strength, increased activity tolerance and balance.  Standardized balance testing now revealing improvement on all tests with only low fall risk per current scores, however patient's perceived balance confidence per ABC scale has actually declined slightly from initial level at eval. He notes continued issues with balance during sit to stand transitions and while walking his dogs on the leash, especially when taking them in and out the door up and  down steps.  Weakness still evident proximally in bilateral hips although distal B LE now grossly 5/5.  All STG's now met, with good progress demonstrated majority of LTG's.  Willam will continue to benefit from skilled PT to address above deficits to improve mobility and activity tolerance with decreased risk for falls.   OBJECTIVE IMPAIRMENTS: decreased activity tolerance, decreased balance, decreased knowledge of condition, decreased mobility, difficulty walking, decreased strength, decreased safety awareness, impaired perceived functional ability, impaired flexibility, and impaired sensation.   ACTIVITY LIMITATIONS: carrying, lifting, bending, standing, squatting, stairs, transfers, bed mobility, locomotion level, and caring for others  PARTICIPATION LIMITATIONS: meal prep, cleaning, laundry, community activity, and occupation  PERSONAL FACTORS: Fitness, Past/current experiences, Time since onset of injury/illness/exacerbation, and 3+ comorbidities: DM-II, elevated BP, GERD, OSA, chronic LBP, h/o R RTC tendinitis, B patellofemoral pain syndrome, B hip pain  are also affecting patient's functional outcome.   REHAB POTENTIAL: Good  CLINICAL DECISION MAKING: Stable/uncomplicated  EVALUATION COMPLEXITY: Low   GOALS: Goals reviewed with patient? Yes  SHORT TERM GOALS: Target date: 08/31/2022   Patient will be independent with initial HEP to improve outcomes and carryover.  Baseline:  Goal status: MET  08/13/22  2.  Patient will be educated on strategies to decrease risk of falls.  Baseline:  Goal status: MET  08/13/22  3.  Patient will demonstrate decreased 5xSTS time to </= 12 sec to decrease risk for falls with transitional mobility. Baseline: 16.54 sec, 15 sec (08/20/22) Goal status: MET  09/07/22 - 10.56 secs  LONG TERM GOALS: Target date: 09/28/2022   Patient will be independent with advanced/ongoing HEP to facilitate ability to maintain/progress functional gains from skilled physical  therapy services. Baseline:  Goal status: IN PROGRESS  09/07/22 - Met for current HEP  2.  Patient will demonstrate improved B LE strength to >/= 4 to 4+/5 for improved stability and ease of mobility . Baseline: Refer to above MMT table Goal status: MET  09/07/22  3.  Patient will be report ability to ambulate for >/= 30 min w/o AD on variable surfaces with good safety to access community.  Baseline:  Goal status: MET  09/07/22 - pt able to walk 1.3 miles in 40 minutes  4.  Patient will be able to step up/down curb safely w/o AD for safety with community ambulation.  Baseline:  Goal status: MET  09/07/22   5.  Patient will report ability to navigate stairs to second floor of home without limitation due to fatigue by end of day. Baseline:  Goal status: MET  09/07/22  6.  Patient will improve Berg score to >/= 54/56 to improve safety and stability with ADLs in standing and reduce risk for falls.  Baseline: 51/56 Goal status: MET  09/07/22  7. Patient will  improve FGA score to at least 28/30 to improve gait stability and reduce risk for falls. Baseline: 24/30 Goal status: IN PROGRESS  09/07/22 - 26/30  10.  Patient will report >/= 90% balance confidence on ABC scale to demonstrate improved functional ability. Baseline: 1190 / 1600 = 74.4 % Goal status: IN PROGRESS  09/07/22 - 1160 / 1600 = 72.5 %   PLAN:  PT FREQUENCY: 2x/week  PT DURATION: 6-8 weeks  PLANNED INTERVENTIONS: Therapeutic exercises, Therapeutic activity, Neuromuscular re-education, Balance training, Gait training, Patient/Family education, Self Care, Joint mobilization, Stair training, DME instructions, Aquatic Therapy, Dry Needling, Electrical stimulation, Cryotherapy, Moist heat, Taping, Ultrasound, Manual therapy, and Re-evaluation  PLAN FOR NEXT SESSION: work on balance with STS and walking dogs on a leash; progress core/lumbopelvic and postural flexibility and strengthening; review/update HEP as needed; static and dynamic  balance training; incorporate trunk and shoulder flexibility and strengthening to improve golf swing   Marry Guan, PT 09/07/2022, 9:59 AM

## 2022-09-07 NOTE — Progress Notes (Addendum)
[facility] Counselor/Therapist Progress Note  Patient ID: Noah Lane, MRN: [id number],    Date: 09/07/2022  Time Spent: 55 min In Person @ [facility]  Time in: 9:00am Time Out: 9:55am  Treatment Type: Individual Therapy  Reported Symptoms: Elevated anx/dep & stress due to recent health status changes & Dx of Colon Cancer in 2024.  Mental Status Exam: Appearance:  Casual     Behavior: Appropriate and Sharing  Motor: Normal  Speech/Language:  Clear and Coherent  Affect: Appropriate  Mood: normal  Thought process: normal  Thought content:   WNL  Sensory/Perceptual disturbances:   WNL  Orientation: oriented to person, place, time/date, and situation  Attention: Good  Concentration: Good  Memory: WNL  Fund of knowledge:  Good  Insight:   Good  Judgment:  Good  Impulse Control: Good   Risk Assessment: Danger to Self:  No Self-injurious Behavior: No Danger to Others: No Duty to Warn:no Physical Aggression / Violence:No  Access to Firearms a concern: No  Gang Involvement:No   Subjective: Pt expresses concern over his worry today. His Fr was a model for worry during his childhood. 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 He is worried for any encounter he may have w/his Former Education administrator) during his trip to the [place]. Pt relates many episodes of anger due to this Leadership.   Pt is also intent on getting a Second Opinion about his Colon Cancer from the [facility].  Pt is striving to gain mental & emot'l flexibility. He dislikes interuptions to his morning routine, even by his Wife Noah Lane. He admits to the inability to, "not let things go", & he has inc'd stress determining the financial future he wants to manifest for him & his Wife.    Interventions:  Solution-Oriented/Positive Psychology, Psycho-education/Bibliotherapy, and Insight-Oriented  Diagnosis:Adjustment reaction with anxiety and depression  Plan: Noah Lane will cont to record on his Tablet entries that assist him in his goals. He will record events that cause excess anger & also things he finds hard to let go so we can address these items next session.   Target Date: 10/14/2022  Progress: 6  Frequency: Once every 3-4 wks  Modality: Noah Fraise, LMFT

## 2022-09-09 ENCOUNTER — Encounter: Payer: Self-pay | Admitting: Family Medicine

## 2022-09-09 ENCOUNTER — Encounter: Payer: Self-pay | Admitting: Physical Therapy

## 2022-09-09 ENCOUNTER — Ambulatory Visit: Payer: Medicare Other | Admitting: Physical Therapy

## 2022-09-09 ENCOUNTER — Ambulatory Visit (INDEPENDENT_AMBULATORY_CARE_PROVIDER_SITE_OTHER): Payer: Medicare Other | Admitting: Family Medicine

## 2022-09-09 VITALS — BP 136/76 | HR 66 | Ht 73.0 in | Wt 253.0 lb

## 2022-09-09 DIAGNOSIS — R2681 Unsteadiness on feet: Secondary | ICD-10-CM

## 2022-09-09 DIAGNOSIS — M722 Plantar fascial fibromatosis: Secondary | ICD-10-CM

## 2022-09-09 DIAGNOSIS — M6281 Muscle weakness (generalized): Secondary | ICD-10-CM | POA: Diagnosis not present

## 2022-09-09 DIAGNOSIS — R262 Difficulty in walking, not elsewhere classified: Secondary | ICD-10-CM

## 2022-09-09 MED ORDER — MELOXICAM 15 MG PO TABS
15.0000 mg | ORAL_TABLET | Freq: Every day | ORAL | 0 refills | Status: DC
Start: 2022-09-09 — End: 2022-09-09

## 2022-09-09 MED ORDER — MELOXICAM 15 MG PO TABS
15.0000 mg | ORAL_TABLET | Freq: Every day | ORAL | 0 refills | Status: DC
Start: 2022-09-09 — End: 2023-05-02

## 2022-09-09 NOTE — Patient Instructions (Signed)
Meloxicam daily as needed for antiinflammatory effect - do not combine with NSAIDs like ibuprofen or Aleve - stop if any stomach symptoms or bleeding Ice, heat, massage, stretches/exercises Referral to sports med for possible injections

## 2022-09-09 NOTE — Progress Notes (Signed)
   Acute Office Visit  Subjective:     Patient ID: Andrzej Scully, male    DOB: 1955-05-13, 67 y.o.   MRN: [id number]  Chief Complaint  Patient presents with   Foot Pain     Patient is in today for left heel pain.   He reports a history of intermittent flares of left foot plantar fasciitis for many years. He has had success with steroid injections in the past via sports medicine. States current flare started this week and has been so bad he could barely walk when he got out of bed in the morning. Pain does ease up slightly with stretching, but is still ery bothersome. He saw PT for other chronic issues today and they did print him home exercises to try.      ROS All review of systems negative except what is listed in the HPI      Objective:    BP 136/76   Pulse 66   Ht 6\' 1"  (1.854 m)   Wt 253 lb (114.8 kg)   SpO2 100%   BMI 33.38 kg/m    Physical Exam Vitals reviewed.  Constitutional:      Appearance: Normal appearance.  Musculoskeletal:        General: No swelling.     Comments: Left heel pain with weight bearing, normal ROM, no edema or skin changes  Skin:    General: Skin is warm and dry.  Neurological:     Mental Status: He is alert and oriented to person, place, and time.  Psychiatric:        Mood and Affect: Mood normal.        Behavior: Behavior normal.        Thought Content: Thought content normal.     No results found for any visits on 09/09/22.      Assessment & Plan:   Problem List Items Addressed This Visit   None Visit Diagnoses     Plantar fasciitis    -  Primary Meloxicam daily as needed for antiinflammatory effect - do not combine with NSAIDs like ibuprofen or Aleve - stop if any stomach symptoms or bleeding Ice, heat, massage, stretches/exercises Referral to sports med for possible injections    Relevant Medications   meloxicam (MOBIC) 15 MG tablet   Other Relevant Orders   Ambulatory referral to Sports Medicine        Meds ordered this encounter  Medications   meloxicam (MOBIC) 15 MG tablet    Sig: Take 1 tablet (15 mg total) by mouth daily.    Dispense:  30 tablet    Refill:  0    Order Specific Question:   Supervising Provider    Answer:   Danise Edge A [[id number]]    Return if symptoms worsen or fail to improve.  Clayborne Dana, NP

## 2022-09-09 NOTE — Therapy (Signed)
OUTPATIENT PHYSICAL THERAPY TREATMENT      Patient Name: Noah Lane MRN: [id number] DOB:07-Dec-1955, 67 y.o., male Today's Date: 09/09/2022   END OF SESSION:  PT End of Session - 09/09/22 0802     Visit Number 11    Date for PT Re-Evaluation 09/28/22    Authorization Type [employer]    PT Start Time 0802    PT Stop Time 0848    PT Time Calculation (min) 46 min    Activity Tolerance Patient tolerated treatment well    Behavior During Therapy WFL for tasks assessed/performed                     Past Medical History:  Diagnosis Date   Diabetes mellitus type 2 in obese    Mixed hyperlipidemia    Past Surgical History:  Procedure Laterality Date   COLON RESECTION N/A 04/27/2022   Procedure: LAPAROSCOPIC HAND ASSISTED RIGHT HEMI COLECTOMY, POSSIBLE OPEN;  Surgeon: Fritzi Mandes, MD;  Location: [facility];  Service: General;  Laterality: N/A;   COLONOSCOPY     HERNIA REPAIR  2012   Patient Active Problem List   Diagnosis Date Noted   Elevated blood pressure reading 05/04/2022   Colon adenocarcinoma (HCC) 05/04/2022   Gastroesophageal reflux disease 04/29/2022   Ileus, postoperative (HCC) 04/29/2022   Hypokalemia 04/28/2022   Hyperlipidemia 04/28/2022   Colonic obstruction (HCC) 04/25/2022   Hyponatremia 04/25/2022   OSA on CPAP 12/03/2021   Obesity (BMI 30.0-34.9) 12/03/2021   Mixed hyperlipidemia 10/20/2021   Strain of lumbar region 10/16/2021   Strain of calf muscle 07/22/2021   Patellofemoral pain syndrome of both knees 02/24/2021   Rotator cuff tendinitis, right 12/28/2019   Hip pain, bilateral 05/28/2019   Chronic bilateral low back pain without sciatica 05/28/2019   Diabetes mellitus type 2 in obese 05/28/2019   Erectile dysfunction 05/28/2019   Pain and swelling of left knee 03/12/2019   Lateral epicondylitis, right elbow 03/15/2017    PCP: Sharlene Dory, DO   REFERRING PROVIDER: Sharlene Dory, DO  REFERRING DIAG:   R53.81 (ICD-10-CM) - Physical deconditioning  R26.89 (ICD-10-CM) - Balance disorder   THERAPY DIAG:  Muscle weakness (generalized)  Unsteadiness on feet  Difficulty in walking, not elsewhere classified  RATIONALE FOR EVALUATION AND TREATMENT: Rehabilitation  ONSET DATE: 04/27/22 - laparoscopic R hemi colectomy for colon cancer  NEXT MD VISIT: TBD   SUBJECTIVE:  SUBJECTIVE STATEMENT: Pt reports new onset of plantar fascia pain in his L foot yesterday.  PAIN: Are you having pain? Yes: NPRS scale: intermittent up to 8/10 Pain location: L plantar fascia Pain description: sharp Aggravating factors: uncertain Relieving factors: stretching  PERTINENT HISTORY:  DM-II, elevated BP, GERD, OSA, chronic LBP, h/o R RTC tendinitis, B patellofemoral pain syndrome, B hip pain  PRECAUTIONS: None  WEIGHT BEARING RESTRICTIONS: No  FALLS:  Has patient fallen in last 6 months? 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  COGNITION: Overall cognitive status: History of cognitive impairments - at  baseline - mostly short-term memory recall   SENSATION: WFL Intermittent numbness and tingling in soles of B feet - most common upon getting up out of a chair  MUSCLE LENGTH: Hamstrings: mod tight L, mild tight R ITB: mod tight L, mild tight R Piriformis: mild tight L>R Hip flexors: mild tight L>R Quads: mild tight L>R Heelcord: NT  POSTURE:  No Significant postural limitations  LOWER EXTREMITY ROM:    Grossly WFL other than limitations due to tightness as above  LOWER EXTREMITY MMT:    MMT Right Eval Left Eval R 09/07/22 L 09/07/22  Hip flexion 4- 4 5 4+  Hip extension 4- 4- 4 4+  Hip abduction 4- 4- 4 4  Hip adduction 4- 4- 4+ 4+  Hip internal rotation 3+ 4- 4+ 4  Hip external rotation 3+ 3+ 4+ 4+  Knee flexion 4+ 4 5 4+  Knee extension 4+ 4+ 5 5  Ankle dorsiflexion 4+ 4+ 5 5  Ankle plantarflexion   5 5  Ankle inversion      Ankle eversion      (Blank rows = not tested)  BED MOBILITY:  Sit to supine Modified independence Supine to sit Modified independence Rolling to Right SBA  TRANSFERS: Assistive device utilized: None  Sit to stand: Complete Independence Stand to sit: Complete Independence Chair to chair: Complete Independence Floor:  NT  GAIT: Distance walked: 80 ft Assistive device utilized: None Level of assistance: Complete Independence Gait pattern: step through pattern, increased lateral sway Comments: Gait speed = 4.22 ft/sec  RAMP: Level of Assistance:  NT Assistive device utilized:  NT Ramp Comments:   CURB:  Level of Assistance:  NT Assistive device utilized:  NT Curb Comments:   STAIRS: (08/11/22)  Level of Assistance: Modified independence Stair Negotiation Technique: Alternating Pattern  Forwards with Single Rail on Left Number of Stairs: 14  Height of Stairs: 7"  Comments: Good reciprocal pattern but definite need for railing for balance/support  FUNCTIONAL TESTS:  5 times sit to stand: 16.54 sec; 08/20/22 - 15 sec Timed up and  go (TUG): 7.81 sec 10 meter walk test: 7.78 sec Berg Balance Scale: (08/11/22) 51/56, 46-51 moderate  risk for falls (>50%)  Functional gait assessment: (08/11/22) 24/30, 19-24 = medium risk fall   PATIENT SURVEYS:  ABC scale 1190 / 1600 = 74.4 %   TODAY'S TREATMENT:   09/09/22 THERAPEUTIC EXERCISE: to improve flexibility, strength and mobility.  Demonstration, verbal and tactile cues throughout for technique.  L plantar fascia stretches: Standing calf stretch with toes on wall x 30" Seated figure-4 with hand pulling up on toes & ankle 2 x 30" Long-sitting with towel 2 x 30" Negative heel off edge of step - demonstrated but not attempted Plantar fascia massage with golf ball or frozen water bottle - pt also shown ice massage cup B side-stepping in slight crouch with GTB at midfeet 2 x 15' Fwd/back monster walk with GTB at ankles 3 x 20' each direction  NEUROMUSCULAR RE-EDUCATION: To improve balance, proprioception, coordination, and reduce fall risk. R SLS + L toe touch for balance + B UE 4-way pull on GTB x 10 - simulating perturbations from dog leash Tandem gait fwd & back w/in arm's reach of wall with intermittent fingertip support on wall 3 x 10'   09/07/22 THERAPEUTIC EXERCISE: to improve flexibility, strength and mobility.  Demonstration, verbal and tactile cues throughout for technique.  Rec Bike - L3 x 7 min  THERAPEUTIC ACTIVITIES: LE MMT 5xSTS = 10.56 sec Berg = 54/56, 52-55 lower risk for falls (> 25%)  = 6.00 sec Gait speed = 5.47 ft/sec FGA = 26/30, 25-28 = low risk fall    08/31/22 THERAPEUTIC EXERCISE: to improve flexibility, strength and mobility.  Demonstration, verbal and tactile cues throughout for technique.  GTB pallof press + rotation x 10 bil GTB B UE chop and lift x 10 each bil  NEUROMUSCULAR RE-EDUCATION: To improve balance, proprioception, coordination, and reduce fall risk.  SLS on blue foam oval + 4-way clocks x 5 bil with  slider   08/27/22 THERAPEUTIC EXERCISE: to improve flexibility, strength and mobility.  Demonstration, verbal and tactile cues throughout for technique. Nustep L5x11min Stairs: 3x14 steps, WFL Standing marching from airex pad 2x10 no UE support Trunk rotation from airex 2x10   Standing hip abduction x 10 no UE support Backwards stepping x 10 bil  Step ups 8' x  15 bil no UE support Leg press 25# R/L x 15   PATIENT EDUCATION:  Education details: HEP update - plantar fascia stretches/massage   Person educated: Patient Education method: Explanation, Demonstration, Verbal cues, Handouts, and MedBridgeGO access code updated Education comprehension: verbalized understanding, returned demonstration, and needs further education  HOME EXERCISE PROGRAM: *Access Code: [email or web address] URL: [email or web address] Date: 09/09/2022 Prepared by: Glenetta Hew  Exercises - Seated Hamstring Stretch  - 1 x daily - 7 x weekly - 3 sets - 30 sec hold - Supine Quadriceps Stretch with Strap on Table  - 1 x daily - 7 x weekly - 3 sets - 30 sec hold - Supine Bridge  - 1 x daily - 3 x weekly - 3 sets - 10 reps - Squat with Counter Support  - 1 x daily - 3 x weekly - 3 sets - 10 reps - Split Stance Shoulder Row with Resistance  - 1 x daily - 3 x weekly - 2 sets - 10 reps - 5 sec hold - Scapular Retraction with Resistance Advanced  - 1 x daily - 3 x weekly - 2 sets - 10 reps - 5 sec hold - Staggered Lat Pull Down with Resistance - Elbows Bent  - 1 x daily -  3 x weekly - 2 sets - 10 reps - 5 sec hold - Standing 3-Way Leg Reach with Resistance at Ankles and Counter Support  - 1 x daily - 3 x weekly - 3 sets - 10 reps - Forward T with Counter Support  - 1 x daily - 3 x weekly - 3 sets - 10 reps - Corner Balance Feet Together With Eyes Open  - 1 x daily - 3 x weekly - 3 reps - 30 sec hold - Corner Balance Feet Together: Eyes Open With Head Turns  - 1 x daily - 3 x weekly - 2 sets - 5 reps - Corner Balance  Feet Together With Eyes Closed  - 1 x daily - 3 x weekly - 2 sets - 10 reps - 3 sec hold - Corner Balance Feet Together: Eyes Closed With Head Turns  - 1 x daily - 3 x weekly - 2 sets - 10 reps - 3 sec hold - Standing Diagonal Chop  - 1 x daily - 3 x weekly - 2 sets - 10 reps - 3 sec hold - Seated Plantar Fascia Stretch  - 2 x daily - 7 x weekly - 3 reps - 30 sec hold - Long Sitting Plantar Fascia Stretch with Towel  - 2-3 x daily - 7 x weekly - 3 reps - 30 sec hold - Plantar Fascia Stretch on Step  - 2-3 x daily - 7 x weekly - 3 reps - 30 sec hold - Gastroc Stretch with Foot at Wall  - 2-3 x daily - 7 x weekly - 3 reps - 30 sec hold - Seated Plantar Fascia Mobilization with Small Ball  - 2-3 x daily - 7 x weekly - 2-3 min hold - Foot Roller Plantar Massage  - 2-3 x daily - 7 x weekly - 2-3 min hold - Single Leg Stance Stabilization with Slow Front to Back Band Oscillations - Anterior Anchor  - 1 x daily - 3 x weekly - 2 sets - 10 reps - 3 sec hold - Single Leg Stance Stabilization with Slow Side to Side Band Oscillations - Lateral Anchor  - 1 x daily - 3 x weekly - 2 sets - 10 reps - 3 sec hold - Single Leg Stance Stabilization with Slow Side to Side Band Oscillations - Medial Anchor  - 1 x daily - 3 x weekly - 2 sets - 10 reps - 3 sec hold - Single Leg Stance Stabilization with Slow Front to Back Band Oscillations - Posterior Anchor  - 1 x daily - 3 x weekly - 2 sets - 10 reps - 3 sec hold - Side Stepping with Resistance at Feet  - 1 x daily - 3 x weekly - 2 sets - 10 reps - Band Walks  - 1 x daily - 3 x weekly - 2 sets - 10 reps  *MedBridgeGO access code email/texted to patient   ASSESSMENT:  CLINICAL IMPRESSION: Gerry reports new onset of L plantar fascia pain yesterday w/o known trigger. Provided instruction in stretching options to target plantar fascia as well as STM options using golf ball of frozen water bottle/ice massage. Pt requesting HEP instructions for all options, but pt  instructed to focus on 1 or 2 stretches that seem to work best for him. Remainder of session focusing on dynamic proximal LE strengthening as well as balance and proprioceptive training to improve stability and reduce fall risk with activities such as walking the dog or swinging  a golf club. Good tolerance for all activities with pt requesting HEP instructions for all new activities. Cautioned pt that not every activity/exercise completed during PT sessions needs to be duplicated at home, however pt still asking for instructions so frequency for all strengthening activities reduced to 3x/wk with pt encouraged to alternate between exercises on different days..   OBJECTIVE IMPAIRMENTS: decreased activity tolerance, decreased balance, decreased knowledge of condition, decreased mobility, difficulty walking, decreased strength, decreased safety awareness, impaired perceived functional ability, impaired flexibility, and impaired sensation.   ACTIVITY LIMITATIONS: carrying, lifting, bending, standing, squatting, stairs, transfers, bed mobility, locomotion level, and caring for others  PARTICIPATION LIMITATIONS: meal prep, cleaning, laundry, community activity, and occupation  PERSONAL FACTORS: Fitness, Past/current experiences, Time since onset of injury/illness/exacerbation, and 3+ comorbidities: DM-II, elevated BP, GERD, OSA, chronic LBP, h/o R RTC tendinitis, B patellofemoral pain syndrome, B hip pain  are also affecting patient's functional outcome.   REHAB POTENTIAL: Good  CLINICAL DECISION MAKING: Stable/uncomplicated  EVALUATION COMPLEXITY: Low   GOALS: Goals reviewed with patient? Yes  SHORT TERM GOALS: Target date: 08/31/2022   Patient will be independent with initial HEP to improve outcomes and carryover.  Baseline:  Goal status: MET  08/13/22  2.  Patient will be educated on strategies to decrease risk of falls.  Baseline:  Goal status: MET  08/13/22  3.  Patient will demonstrate  decreased 5xSTS time to </= 12 sec to decrease risk for falls with transitional mobility. Baseline: 16.54 sec, 15 sec (08/20/22) Goal status: MET  09/07/22 - 10.56 secs  LONG TERM GOALS: Target date: 09/28/2022   Patient will be independent with advanced/ongoing HEP to facilitate ability to maintain/progress functional gains from skilled physical therapy services. Baseline:  Goal status: IN PROGRESS  09/07/22 - Met for current HEP  2.  Patient will demonstrate improved B LE strength to >/= 4 to 4+/5 for improved stability and ease of mobility . Baseline: Refer to above MMT table Goal status: MET  09/07/22  3.  Patient will be report ability to ambulate for >/= 30 min w/o AD on variable surfaces with good safety to access community.  Baseline:  Goal status: MET  09/07/22 - pt able to walk 1.3 miles in 40 minutes  4.  Patient will be able to step up/down curb safely w/o AD for safety with community ambulation.  Baseline:  Goal status: MET  09/07/22   5.  Patient will report ability to navigate stairs to second floor of home without limitation due to fatigue by end of day. Baseline:  Goal status: MET  09/07/22  6.  Patient will improve Berg score to >/= 54/56 to improve safety and stability with ADLs in standing and reduce risk for falls.  Baseline: 51/56 Goal status: MET  09/07/22  7. Patient will improve FGA score to at least 28/30 to improve gait stability and reduce risk for falls. Baseline: 24/30 Goal status: IN PROGRESS  09/07/22 - 26/30  10.  Patient will report >/= 90% balance confidence on ABC scale to demonstrate improved functional ability. Baseline: 1190 / 1600 = 74.4 % Goal status: IN PROGRESS  09/07/22 - 1160 / 1600 = 72.5 %   PLAN:  PT FREQUENCY: 2x/week  PT DURATION: 6-8 weeks  PLANNED INTERVENTIONS: Therapeutic exercises, Therapeutic activity, Neuromuscular re-education, Balance training, Gait training, Patient/Family education, Self Care, Joint mobilization, Stair training,  DME instructions, Aquatic Therapy, Dry Needling, Electrical stimulation, Cryotherapy, Moist heat, Taping, Ultrasound, Manual therapy, and Re-evaluation  PLAN FOR NEXT  SESSION: work on balance with STS and walking dogs on a leash; progress core/lumbopelvic and postural flexibility and strengthening; review/update HEP as needed; static and dynamic balance training; incorporate trunk and shoulder flexibility and strengthening to improve golf swing   Marry Guan, PT 09/09/2022, 9:01 AM

## 2022-09-09 NOTE — Addendum Note (Signed)
Addended by: Hyman Hopes B on: 09/09/2022 04:54 PM   Modules accepted: Orders

## 2022-09-13 ENCOUNTER — Ambulatory Visit (HOSPITAL_BASED_OUTPATIENT_CLINIC_OR_DEPARTMENT_OTHER)
Admission: RE | Admit: 2022-09-13 | Discharge: 2022-09-13 | Disposition: A | Discharge status: Home / Self Care | Payer: Medicare Other | Source: Ambulatory Visit | Attending: Hematology & Oncology | Admitting: Hematology & Oncology

## 2022-09-13 ENCOUNTER — Encounter (HOSPITAL_BASED_OUTPATIENT_CLINIC_OR_DEPARTMENT_OTHER): Payer: Self-pay

## 2022-09-13 DIAGNOSIS — C189 Malignant neoplasm of colon, unspecified: Secondary | ICD-10-CM | POA: Insufficient documentation

## 2022-09-13 MED ORDER — IOHEXOL 300 MG/ML  SOLN
100.0000 mL | Freq: Once | INTRAMUSCULAR | Status: AC | PRN
  Administered 2022-09-13: 100 mL via INTRAVENOUS

## 2022-09-14 ENCOUNTER — Ambulatory Visit: Payer: Medicare Other

## 2022-09-15 ENCOUNTER — Encounter: Payer: Self-pay | Admitting: Hematology & Oncology

## 2022-09-15 ENCOUNTER — Inpatient Hospital Stay: Payer: Medicare Other | Attending: Hematology & Oncology | Admitting: Hematology & Oncology

## 2022-09-15 ENCOUNTER — Ambulatory Visit: Payer: Medicare Other

## 2022-09-15 ENCOUNTER — Other Ambulatory Visit: Payer: Self-pay

## 2022-09-15 VITALS — BP 121/99 | HR 75 | Temp 99.5°F | Resp 18 | Ht 73.0 in | Wt 251.0 lb

## 2022-09-15 DIAGNOSIS — R2681 Unsteadiness on feet: Secondary | ICD-10-CM

## 2022-09-15 DIAGNOSIS — C189 Malignant neoplasm of colon, unspecified: Secondary | ICD-10-CM | POA: Diagnosis not present

## 2022-09-15 DIAGNOSIS — R5383 Other fatigue: Secondary | ICD-10-CM | POA: Diagnosis not present

## 2022-09-15 DIAGNOSIS — M6281 Muscle weakness (generalized): Secondary | ICD-10-CM | POA: Diagnosis not present

## 2022-09-15 DIAGNOSIS — Z9049 Acquired absence of other specified parts of digestive tract: Secondary | ICD-10-CM | POA: Diagnosis not present

## 2022-09-15 DIAGNOSIS — R262 Difficulty in walking, not elsewhere classified: Secondary | ICD-10-CM

## 2022-09-15 DIAGNOSIS — Z85038 Personal history of other malignant neoplasm of large intestine: Secondary | ICD-10-CM | POA: Diagnosis present

## 2022-09-15 NOTE — Therapy (Signed)
OUTPATIENT PHYSICAL THERAPY TREATMENT      Patient Name: Noah Lane MRN: [id number] DOB:25-Nov-1955, 67 y.o., male Today's Date: 09/15/2022   END OF SESSION:  PT End of Session - 09/15/22 1707     Visit Number 12    Date for PT Re-Evaluation 09/28/22    Authorization Type [employer]    PT Start Time 1618    PT Stop Time 1700    PT Time Calculation (min) 42 min    Activity Tolerance Patient tolerated treatment well    Behavior During Therapy WFL for tasks assessed/performed                      Past Medical History:  Diagnosis Date   Diabetes mellitus type 2 in obese    Mixed hyperlipidemia    Past Surgical History:  Procedure Laterality Date   COLON RESECTION N/A 04/27/2022   Procedure: LAPAROSCOPIC HAND ASSISTED RIGHT HEMI COLECTOMY, POSSIBLE OPEN;  Surgeon: Fritzi Mandes, MD;  Location: [facility];  Service: General;  Laterality: N/A;   COLONOSCOPY     HERNIA REPAIR  2012   Patient Active Problem List   Diagnosis Date Noted   Elevated blood pressure reading 05/04/2022   Colon adenocarcinoma (HCC) 05/04/2022   Gastroesophageal reflux disease 04/29/2022   Ileus, postoperative (HCC) 04/29/2022   Hypokalemia 04/28/2022   Hyperlipidemia 04/28/2022   Colonic obstruction (HCC) 04/25/2022   Hyponatremia 04/25/2022   OSA on CPAP 12/03/2021   Obesity (BMI 30.0-34.9) 12/03/2021   Mixed hyperlipidemia 10/20/2021   Strain of lumbar region 10/16/2021   Strain of calf muscle 07/22/2021   Patellofemoral pain syndrome of both knees 02/24/2021   Rotator cuff tendinitis, right 12/28/2019   Hip pain, bilateral 05/28/2019   Chronic bilateral low back pain without sciatica 05/28/2019   Diabetes mellitus type 2 in obese 05/28/2019   Erectile dysfunction 05/28/2019   Pain and swelling of left knee 03/12/2019   Lateral epicondylitis, right elbow 03/15/2017    PCP: Sharlene Dory, DO   REFERRING PROVIDER: Sharlene Dory, DO  REFERRING DIAG:   R53.81 (ICD-10-CM) - Physical deconditioning  R26.89 (ICD-10-CM) - Balance disorder   THERAPY DIAG:  Muscle weakness (generalized)  Unsteadiness on feet  Difficulty in walking, not elsewhere classified  RATIONALE FOR EVALUATION AND TREATMENT: Rehabilitation  ONSET DATE: 04/27/22 - laparoscopic R hemi colectomy for colon cancer  NEXT MD VISIT: TBD   SUBJECTIVE:  SUBJECTIVE STATEMENT: Pt reports resolved L heel pain. Good report from oncology earlier today. Wants to work more on golf swings today  PAIN: Are you having pain? No  PERTINENT HISTORY:  DM-II, elevated BP, GERD, OSA, chronic LBP, h/o R RTC tendinitis, B patellofemoral pain syndrome, B hip pain  PRECAUTIONS: None  WEIGHT BEARING RESTRICTIONS: No  FALLS:  Has patient fallen in last 6 months? 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  COGNITION: Overall cognitive status: History of cognitive impairments - at baseline - mostly short-term memory recall   SENSATION: WFL Intermittent numbness and tingling in soles of B feet  - most common upon getting up out of a chair  MUSCLE LENGTH: Hamstrings: mod tight L, mild tight R ITB: mod tight L, mild tight R Piriformis: mild tight L>R Hip flexors: mild tight L>R Quads: mild tight L>R Heelcord: NT  POSTURE:  No Significant postural limitations  LOWER EXTREMITY ROM:    Grossly WFL other than limitations due to tightness as above  LOWER EXTREMITY MMT:    MMT Right Eval Left Eval R 09/07/22 L 09/07/22  Hip flexion 4- 4 5 4+  Hip extension 4- 4- 4 4+  Hip abduction 4- 4- 4 4  Hip adduction 4- 4- 4+ 4+  Hip internal rotation 3+ 4- 4+ 4  Hip external rotation 3+ 3+ 4+ 4+  Knee flexion 4+ 4 5 4+  Knee extension 4+ 4+ 5 5  Ankle dorsiflexion 4+ 4+ 5 5  Ankle plantarflexion   5 5  Ankle inversion      Ankle eversion      (Blank rows = not tested)  BED MOBILITY:  Sit to supine Modified independence Supine to sit Modified independence Rolling to Right SBA  TRANSFERS: Assistive device utilized: None  Sit to stand: Complete Independence Stand to sit: Complete Independence Chair to chair: Complete Independence Floor:  NT  GAIT: Distance walked: 80 ft Assistive device utilized: None Level of assistance: Complete Independence Gait pattern: step through pattern, increased lateral sway Comments: Gait speed = 4.22 ft/sec  RAMP: Level of Assistance:  NT Assistive device utilized:  NT Ramp Comments:   CURB:  Level of Assistance:  NT Assistive device utilized:  NT Curb Comments:   STAIRS: (08/11/22)  Level of Assistance: Modified independence Stair Negotiation Technique: Alternating Pattern  Forwards with Single Rail on Left Number of Stairs: 14  Height of Stairs: 7"  Comments: Good reciprocal pattern but definite need for railing for balance/support  FUNCTIONAL TESTS:  5 times sit to stand: 16.54 sec; 08/20/22 - 15 sec Timed up and go (TUG): 7.81 sec 10 meter walk test: 7.78 sec Berg Balance Scale: (08/11/22) 51/56, 46-51 moderate risk for falls  (>50%)  Functional gait assessment: (08/11/22) 24/30, 19-24 = medium  risk fall   PATIENT SURVEYS:  ABC scale 1190 / 1600 = 74.4 %   TODAY'S TREATMENT:  09/15/22 THERAPEUTIC EXERCISE: to improve flexibility, strength and mobility.  Demonstration, verbal and tactile cues throughout for technique.  Chops both ways GTB 2x10  Seated lumbar stretch with green pball 3x15 sec Chop with blue wt ball x 10 bil Penguin walk with GTB at thighs x 10 all directions Side steps GTB at thighs stiff leg along the counter x 3 - rest break - x 3 again down and back as 1 rep  09/09/22 THERAPEUTIC EXERCISE: to improve flexibility, strength and mobility.  Demonstration, verbal and tactile cues throughout for technique.  L plantar fascia stretches: Standing calf stretch with toes on wall x 30" Seated figure-4 with hand pulling up on toes & ankle 2 x 30" Long-sitting with towel 2 x 30" Negative heel off edge of step - demonstrated but not attempted Plantar fascia massage with golf ball or frozen water bottle - pt also shown ice massage cup B side-stepping in slight crouch with GTB at midfeet 2 x 15' Fwd/back monster walk with GTB at ankles 3 x 20' each direction  NEUROMUSCULAR RE-EDUCATION: To improve balance, proprioception, coordination, and reduce fall risk. R SLS + L toe touch for balance + B UE 4-way pull on GTB x 10 - simulating perturbations from dog leash Tandem gait fwd & back w/in arm's reach of wall with intermittent fingertip support on wall 3 x 10'   09/07/22 THERAPEUTIC EXERCISE: to improve flexibility, strength and mobility.  Demonstration, verbal and tactile cues throughout for technique.  Rec Bike - L3 x 7 min  THERAPEUTIC ACTIVITIES: LE MMT 5xSTS = 10.56 sec Berg = 54/56, 52-55 lower risk for falls (> 25%)  = 6.00 sec Gait speed = 5.47 ft/sec FGA = 26/30, 25-28 = low risk fall    08/31/22 THERAPEUTIC EXERCISE: to improve flexibility, strength and mobility.  Demonstration, verbal  and tactile cues throughout for technique.  GTB pallof press + rotation x 10 bil GTB B UE chop and lift x 10 each bil  NEUROMUSCULAR RE-EDUCATION: To improve balance, proprioception, coordination, and reduce fall risk.  SLS on blue foam oval + 4-way clocks x 5 bil with slider   08/27/22 THERAPEUTIC EXERCISE: to improve flexibility, strength and mobility.  Demonstration, verbal and tactile cues throughout for technique. Nustep L5x66min Stairs: 3x14 steps, WFL Standing marching from airex pad 2x10 no UE support Trunk rotation from airex 2x10   Standing hip abduction x 10 no UE support Backwards stepping x 10 bil  Step ups 8' x  15 bil no UE support Leg press 25# R/L x 15   PATIENT EDUCATION:  Education details: HEP update - plantar fascia stretches/massage   Person educated: Patient Education method: Explanation, Demonstration, Verbal cues, Handouts, and MedBridgeGO access code updated Education comprehension: verbalized understanding, returned demonstration, and needs further education  HOME EXERCISE PROGRAM: *Access Code: [email or web address] URL: [email or web address] Date: 09/09/2022 Prepared by: Glenetta Hew  Exercises - Seated Hamstring Stretch  - 1 x daily - 7 x weekly - 3 sets - 30 sec hold - Supine Quadriceps Stretch with Strap on Table  - 1 x daily - 7 x weekly - 3 sets - 30 sec hold - Supine Bridge  - 1 x daily - 3 x weekly - 3 sets - 10 reps - Squat with Counter Support  - 1 x daily - 3 x weekly - 3 sets - 10 reps - Split Stance  Shoulder Row with Resistance  - 1 x daily - 3 x weekly - 2 sets - 10 reps - 5 sec hold - Scapular Retraction with Resistance Advanced  - 1 x daily - 3 x weekly - 2 sets - 10 reps - 5 sec hold - Staggered Lat Pull Down with Resistance - Elbows Bent  - 1 x daily - 3 x weekly - 2 sets - 10 reps - 5 sec hold - Standing 3-Way Leg Reach with Resistance at Ankles and Counter Support  - 1 x daily - 3 x weekly - 3 sets - 10 reps - Forward T with  Counter Support  - 1 x daily - 3 x weekly - 3 sets - 10 reps - Corner Balance Feet Together With Eyes Open  - 1 x daily - 3 x weekly - 3 reps - 30 sec hold - Corner Balance Feet Together: Eyes Open With Head Turns  - 1 x daily - 3 x weekly - 2 sets - 5 reps - Corner Balance Feet Together With Eyes Closed  - 1 x daily - 3 x weekly - 2 sets - 10 reps - 3 sec hold - Corner Balance Feet Together: Eyes Closed With Head Turns  - 1 x daily - 3 x weekly - 2 sets - 10 reps - 3 sec hold - Standing Diagonal Chop  - 1 x daily - 3 x weekly - 2 sets - 10 reps - 3 sec hold - Seated Plantar Fascia Stretch  - 2 x daily - 7 x weekly - 3 reps - 30 sec hold - Long Sitting Plantar Fascia Stretch with Towel  - 2-3 x daily - 7 x weekly - 3 reps - 30 sec hold - Plantar Fascia Stretch on Step  - 2-3 x daily - 7 x weekly - 3 reps - 30 sec hold - Gastroc Stretch with Foot at Wall  - 2-3 x daily - 7 x weekly - 3 reps - 30 sec hold - Seated Plantar Fascia Mobilization with Small Ball  - 2-3 x daily - 7 x weekly - 2-3 min hold - Foot Roller Plantar Massage  - 2-3 x daily - 7 x weekly - 2-3 min hold - Single Leg Stance Stabilization with Slow Front to Back Band Oscillations - Anterior Anchor  - 1 x daily - 3 x weekly - 2 sets - 10 reps - 3 sec hold - Single Leg Stance Stabilization with Slow Side to Side Band Oscillations - Lateral Anchor  - 1 x daily - 3 x weekly - 2 sets - 10 reps - 3 sec hold - Single Leg Stance Stabilization with Slow Side to Side Band Oscillations - Medial Anchor  - 1 x daily - 3 x weekly - 2 sets - 10 reps - 3 sec hold - Single Leg Stance Stabilization with Slow Front to Back Band Oscillations - Posterior Anchor  - 1 x daily - 3 x weekly - 2 sets - 10 reps - 3 sec hold - Side Stepping with Resistance at Feet  - 1 x daily - 3 x weekly - 2 sets - 10 reps - Band Walks  - 1 x daily - 3 x weekly - 2 sets - 10 reps  *MedBridgeGO access code email/texted to patient   ASSESSMENT:  CLINICAL IMPRESSION: Pt  with no complaint of L plantar fascia pain coming in today. Focused primarily on golf swings, while also keeping core engaged and spine neutral. We also  worked on hip strengthening to improve stability with golf swings and everyday ambulation. VC required to coordinate movement with penguin walks and also LOB noted with this exercise. Cues also required with golf swing (chops) to avoid rounding spine. Added seated lumbar stretch to HEP. Pt with good response to treatment, will continue emphasizing interventions specific to golf activities to allow for full return to PLOF.  OBJECTIVE IMPAIRMENTS: decreased activity tolerance, decreased balance, decreased knowledge of condition, decreased mobility, difficulty walking, decreased strength, decreased safety awareness, impaired perceived functional ability, impaired flexibility, and impaired sensation.   ACTIVITY LIMITATIONS: carrying, lifting, bending, standing, squatting, stairs, transfers, bed mobility, locomotion level, and caring for others  PARTICIPATION LIMITATIONS: meal prep, cleaning, laundry, community activity, and occupation  PERSONAL FACTORS: Fitness, Past/current experiences, Time since onset of injury/illness/exacerbation, and 3+ comorbidities: DM-II, elevated BP, GERD, OSA, chronic LBP, h/o R RTC tendinitis, B patellofemoral pain syndrome, B hip pain  are also affecting patient's functional outcome.   REHAB POTENTIAL: Good  CLINICAL DECISION MAKING: Stable/uncomplicated  EVALUATION COMPLEXITY: Low   GOALS: Goals reviewed with patient? Yes  SHORT TERM GOALS: Target date: 08/31/2022   Patient will be independent with initial HEP to improve outcomes and carryover.  Baseline:  Goal status: MET  08/13/22  2.  Patient will be educated on strategies to decrease risk of falls.  Baseline:  Goal status: MET  08/13/22  3.  Patient will demonstrate decreased 5xSTS time to </= 12 sec to decrease risk for falls with transitional  mobility. Baseline: 16.54 sec, 15 sec (08/20/22) Goal status: MET  09/07/22 - 10.56 secs  LONG TERM GOALS: Target date: 09/28/2022   Patient will be independent with advanced/ongoing HEP to facilitate ability to maintain/progress functional gains from skilled physical therapy services. Baseline:  Goal status: IN PROGRESS  09/07/22 - Met for current HEP  2.  Patient will demonstrate improved B LE strength to >/= 4 to 4+/5 for improved stability and ease of mobility . Baseline: Refer to above MMT table Goal status: MET  09/07/22  3.  Patient will be report ability to ambulate for >/= 30 min w/o AD on variable surfaces with good safety to access community.  Baseline:  Goal status: MET  09/07/22 - pt able to walk 1.3 miles in 40 minutes  4.  Patient will be able to step up/down curb safely w/o AD for safety with community ambulation.  Baseline:  Goal status: MET  09/07/22   5.  Patient will report ability to navigate stairs to second floor of home without limitation due to fatigue by end of day. Baseline:  Goal status: MET  09/07/22  6.  Patient will improve Berg score to >/= 54/56 to improve safety and stability with ADLs in standing and reduce risk for falls.  Baseline: 51/56 Goal status: MET  09/07/22  7. Patient will improve FGA score to at least 28/30 to improve gait stability and reduce risk for falls. Baseline: 24/30 Goal status: IN PROGRESS  09/07/22 - 26/30  10.  Patient will report >/= 90% balance confidence on ABC scale to demonstrate improved functional ability. Baseline: 1190 / 1600 = 74.4 % Goal status: IN PROGRESS  09/07/22 - 1160 / 1600 = 72.5 %   PLAN:  PT FREQUENCY: 2x/week  PT DURATION: 6-8 weeks  PLANNED INTERVENTIONS: Therapeutic exercises, Therapeutic activity, Neuromuscular re-education, Balance training, Gait training, Patient/Family education, Self Care, Joint mobilization, Stair training, DME instructions, Aquatic Therapy, Dry Needling, Electrical stimulation,  Cryotherapy, Moist heat, Taping, Ultrasound, Manual  therapy, and Re-evaluation  PLAN FOR NEXT SESSION: work on balance with STS and walking dogs on a leash; progress core/lumbopelvic and postural flexibility and strengthening; review/update HEP as needed; static and dynamic balance training; incorporate trunk and shoulder flexibility and strengthening to improve golf swing   Meridee Branum L Everitt Wenner, PTA 09/15/2022, 5:07 PM

## 2022-09-15 NOTE — Progress Notes (Signed)
Hematology and Oncology Follow Up Visit  Noah Lane [id number] 18-Apr-1955 67 y.o. 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  We first saw him back on 05/14/2022.  Since then, he has been doing well.  We did do a CT scan on him.  This was done on 09/13/2022.  The CT scan not show any evidence of recurrent disease.  He has had no problems with nausea or vomiting.  He has had no cough or shortness of breath.  He has had no bleeding or bruising.  Still has some fatigue.  I told him that it may take a good 6-8 months before he starts to feel better.  His last CEA level from 08/26/2022 was less than 1.  He has had no fever.  He has had no rashes.  Overall, I would say his performance status is probably ECOG 0.  Medications:  Current Outpatient Medications:    atorvastatin (LIPITOR) 20 MG tablet, Take 1 tablet (20 mg total) by mouth daily., Disp: 90 tablet, Rfl: 3   feeding supplement (ENSURE ENLIVE / ENSURE PLUS) LIQD, Take 237 mLs by mouth 3 (three) times daily with meals., Disp: 237 mL, Rfl: 12   meloxicam (MOBIC) 15 MG tablet, Take 1 tablet (15 mg total) by mouth daily., Disp: 30 tablet, Rfl: 0   metFORMIN (GLUCOPHAGE-XR) 500 MG 24 hr tablet, TAKE 1 TABLET BY MOUTH DAILY WITH BREAKFAST, Disp: 90 tablet, Rfl: 0   nitroGLYCERIN (NITRODUR - DOSED IN MG/24 HR) 0.2 mg/hr patch, Cut and apply 1/4 patch to most painful area q24h., Disp: 30 patch, Rfl: 11   olmesartan (BENICAR) 20 MG tablet, TAKE 1 TABLET BY MOUTH DAILY, Disp: 90 tablet, Rfl: 1   polycarbophil (FIBERCON) 625 MG tablet, Take 1 tablet (625 mg total) by mouth 2 (two) times daily., Disp: , Rfl:    PRESCRIPTION MEDICATION, Inject 4 mLs as directed as needed (erectile disfunction). prostaglandin, Disp: , Rfl:    Semaglutide,0.25 or 0.5MG /DOS,  (OZEMPIC, 0.25 OR 0.5 MG/DOSE,) 2 MG/3ML SOPN, DIAL AND INJECT UNDER THE SKIN 0.25 MG WEEKLY FOR 4 WEEKS THEN DIAL AND INJECT 0.5 MG WEEKLY THEREAFTER, Disp: 3 mL, Rfl: 1  Allergies: No Known Allergies  Past Medical History, Surgical history, Social history, and Family History were reviewed and updated.  Review of Systems: Review of Systems  Constitutional:  Positive for fatigue.  HENT:  Negative.    Eyes: Negative.   Respiratory: Negative.    Cardiovascular: Negative.   Gastrointestinal: Negative.   Endocrine: Negative.   Genitourinary: Negative.    Musculoskeletal: Negative.   Skin: Negative.   Neurological: Negative.   Hematological: Negative.   Psychiatric/Behavioral: Negative.      Physical Exam:  height is 6\' 1"  (1.854 m) and weight is 251 lb (113.9 kg). His oral temperature is 99.5 F (37.5 C). His blood pressure is 121/99 (abnormal) and his pulse is 75. His respiration is 18 and oxygen saturation is 99%.   Wt Readings from Last 3 Encounters:  09/15/22 251 lb (113.9 kg)  09/09/22 253 lb (114.8 kg)  08/16/22 249 lb (112.9 kg)    Physical Exam Vitals reviewed.  HENT:     Head: Normocephalic and atraumatic.  Eyes:     Pupils: Pupils are equal, round, and reactive to light.  Cardiovascular:  Rate and Rhythm: Normal rate and regular rhythm.     Heart sounds: Normal heart sounds.  Pulmonary:     Effort: Pulmonary effort is normal.     Breath sounds: Normal breath sounds.  Abdominal:     General: Bowel sounds are normal.     Palpations: Abdomen is soft.     Comments: Abdominal exam shows a laparoscopic surgical scar that is well-healed.  He has no abdominal mass.  There is no fluid wave.  He has no palpable liver or spleen tip.  Musculoskeletal:        General: No tenderness or deformity. Normal range of motion.     Cervical back: Normal range of motion.  Lymphadenopathy:     Cervical: No cervical adenopathy.  Skin:    General: Skin is warm and dry.      Findings: No erythema or rash.  Neurological:     Mental Status: He is alert and oriented to person, place, and time.  Psychiatric:        Behavior: Behavior normal.        Thought Content: Thought content normal.        Judgment: Judgment normal.      Lab Results  Component Value Date   WBC 8.9 08/26/2022   HGB 14.2 08/26/2022   HCT 42.9 08/26/2022   MCV 84.3 08/26/2022   PLT 172 08/26/2022     Chemistry      Component Value Date/Time   NA 140 08/26/2022 1400   K 4.8 08/26/2022 1400   CL 102 08/26/2022 1400   CO2 27 08/26/2022 1400   BUN 24 (H) 08/26/2022 1400   CREATININE 1.26 (H) 08/26/2022 1400   CREATININE 1.11 05/23/2020 1628      Component Value Date/Time   CALCIUM 10.1 08/26/2022 1400   ALKPHOS 40 08/26/2022 1400   AST 17 08/26/2022 1400   ALT 14 08/26/2022 1400   BILITOT 1.6 (H) 08/26/2022 1400      Impression and Plan: Noah Lane is a very nice 67 year old white male.  He has history of early stage colon cancer.  This is a good prognostic colon cancer.  Of note, he had 18 lymph nodes that were removed.  There is no lymphovascular space invasion or perineural invasion.  I had to believe that his chance of being cured with surgery alone should be over 90%.  I still think we have to follow him with scans.  I probably do another set of scans on him after the Holiday season.  The every 6 months would be reasonable for right now.  It is always fun talking to him.  He is incredibly interesting and very diverse knowledge base.   Noah Macho, MD 7/17/20242:29 PM

## 2022-09-16 ENCOUNTER — Telehealth: Payer: Self-pay | Admitting: Family Medicine

## 2022-09-16 NOTE — Telephone Encounter (Signed)
Copied from CRM [id number]. Topic: Medicare AWV >> Sep 16, 2022  9:47 AM Payton Doughty wrote: Reason for CRM: LM 09/16/2022 to schedule AWV   Verlee Rossetti; Care Guide Ambulatory Clinical Support [place] [facility] Direct Dial: [phone]

## 2022-09-17 ENCOUNTER — Ambulatory Visit: Payer: Medicare Other

## 2022-09-17 DIAGNOSIS — R262 Difficulty in walking, not elsewhere classified: Secondary | ICD-10-CM

## 2022-09-17 DIAGNOSIS — R2681 Unsteadiness on feet: Secondary | ICD-10-CM

## 2022-09-17 DIAGNOSIS — M6281 Muscle weakness (generalized): Secondary | ICD-10-CM

## 2022-09-17 NOTE — Therapy (Signed)
OUTPATIENT PHYSICAL THERAPY TREATMENT      Patient Name: Noah Lane MRN: [id number] DOB:[birth year], 67 y.o., male Today's Date: 09/17/2022   END OF SESSION:  PT End of Session - 09/17/22 0850     Visit Number 13    Date for PT Re-Evaluation 09/28/22    Authorization Type [employer]    PT Start Time 0848    PT Stop Time 0930    PT Time Calculation (min) 42 min    Activity Tolerance Patient tolerated treatment well    Behavior During Therapy WFL for tasks assessed/performed                       Past Medical History:  Diagnosis Date   Diabetes mellitus type 2 in obese    Mixed hyperlipidemia    Past Surgical History:  Procedure Laterality Date   COLON RESECTION N/A 04/27/2022   Procedure: LAPAROSCOPIC HAND ASSISTED RIGHT HEMI COLECTOMY, POSSIBLE OPEN;  Surgeon: Fritzi Mandes, MD;  Location: [facility];  Service: General;  Laterality: N/A;   COLONOSCOPY     HERNIA REPAIR  2012   Patient Active Problem List   Diagnosis Date Noted   Elevated blood pressure reading 05/04/2022   Colon adenocarcinoma (HCC) 05/04/2022   Gastroesophageal reflux disease 04/29/2022   Ileus, postoperative (HCC) 04/29/2022   Hypokalemia 04/28/2022   Hyperlipidemia 04/28/2022   Colonic obstruction (HCC) 04/25/2022   Hyponatremia 04/25/2022   OSA on CPAP 12/03/2021   Obesity (BMI 30.0-34.9) 12/03/2021   Mixed hyperlipidemia 10/20/2021   Strain of lumbar region 10/16/2021   Strain of calf muscle 07/22/2021   Patellofemoral pain syndrome of both knees 02/24/2021   Rotator cuff tendinitis, right 12/28/2019   Hip pain, bilateral 05/28/2019   Chronic bilateral low back pain without sciatica 05/28/2019   Diabetes mellitus type 2 in obese 05/28/2019   Erectile dysfunction 05/28/2019   Pain and swelling of left knee 03/12/2019   Lateral epicondylitis, right elbow 03/15/2017    PCP: Sharlene Dory, DO   REFERRING PROVIDER: Sharlene Dory, DO  REFERRING  DIAG:  R53.81 (ICD-10-CM) - Physical deconditioning  R26.89 (ICD-10-CM) - Balance disorder   THERAPY DIAG:  Muscle weakness (generalized)  Unsteadiness on feet  Difficulty in walking, not elsewhere classified  RATIONALE FOR EVALUATION AND TREATMENT: Rehabilitation  ONSET DATE: 04/27/22 - laparoscopic R hemi colectomy for colon cancer  NEXT MD VISIT: TBD   SUBJECTIVE:  SUBJECTIVE STATEMENT: Pt reports soreness from last visit.   PAIN: Are you having pain? No  PERTINENT HISTORY:  DM-II, elevated BP, GERD, OSA, chronic LBP, h/o R RTC tendinitis, B patellofemoral pain syndrome, B hip pain  PRECAUTIONS: None  WEIGHT BEARING RESTRICTIONS: No  FALLS:  Has patient fallen in last 6 months? 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  COGNITION: Overall cognitive status: History of cognitive impairments - at baseline - mostly short-term memory recall   SENSATION: WFL Intermittent numbness and tingling in soles of B feet - most common upon getting up out of a chair  MUSCLE  LENGTH: Hamstrings: mod tight L, mild tight R ITB: mod tight L, mild tight R Piriformis: mild tight L>R Hip flexors: mild tight L>R Quads: mild tight L>R Heelcord: NT  POSTURE:  No Significant postural limitations  LOWER EXTREMITY ROM:    Grossly WFL other than limitations due to tightness as above  LOWER EXTREMITY MMT:    MMT Right Eval Left Eval R 09/07/22 L 09/07/22  Hip flexion 4- 4 5 4+  Hip extension 4- 4- 4 4+  Hip abduction 4- 4- 4 4  Hip adduction 4- 4- 4+ 4+  Hip internal rotation 3+ 4- 4+ 4  Hip external rotation 3+ 3+ 4+ 4+  Knee flexion 4+ 4 5 4+  Knee extension 4+ 4+ 5 5  Ankle dorsiflexion 4+ 4+ 5 5  Ankle plantarflexion   5 5  Ankle inversion      Ankle eversion      (Blank rows = not tested)  BED MOBILITY:  Sit to supine Modified independence Supine to sit Modified independence Rolling to Right SBA  TRANSFERS: Assistive device utilized: None  Sit to stand: Complete Independence Stand to sit: Complete Independence Chair to chair: Complete Independence Floor:  NT  GAIT: Distance walked: 80 ft Assistive device utilized: None Level of assistance: Complete Independence Gait pattern: step through pattern, increased lateral sway Comments: Gait speed = 4.22 ft/sec  RAMP: Level of Assistance:  NT Assistive device utilized:  NT Ramp Comments:   CURB:  Level of Assistance:  NT Assistive device utilized:  NT Curb Comments:   STAIRS: (08/11/22)  Level of Assistance: Modified independence Stair Negotiation Technique: Alternating Pattern  Forwards with Single Rail on Left Number of Stairs: 14  Height of Stairs: 7"  Comments: Good reciprocal pattern but definite need for railing for balance/support  FUNCTIONAL TESTS:  5 times sit to stand: 16.54 sec; 08/20/22 - 15 sec Timed up and go (TUG): 7.81 sec 10 meter walk test: 7.78 sec Berg Balance Scale: (08/11/22) 51/56, 46-51 moderate risk for falls (>50%)  Functional gait assessment: (08/11/22) 24/30,  19-24 = medium risk fall   PATIENT SURVEYS:  ABC scale 1190 / 1600 =  74.4 %   TODAY'S TREATMENT:  09/17/22 THERAPEUTIC EXERCISE: to improve flexibility, strength and mobility.  Demonstration, verbal and tactile cues throughout for technique. FGA: 27/30 Golf swings blue TB each direction x 20  Supine hip ER/IR x 10 bil Supine bridge with step out x 10 bil Supine bridge with march x 10  09/15/22 THERAPEUTIC EXERCISE: to improve flexibility, strength and mobility.  Demonstration, verbal and tactile cues throughout for technique.  Chops both ways GTB 2x10  Seated lumbar stretch with green pball 3x15 sec Chop with blue wt ball x 10 bil Penguin walk with GTB at thighs x 10 all directions Side steps GTB at thighs stiff leg along the counter x 3 - rest break - x 3 again down and back as 1 rep  09/09/22 THERAPEUTIC EXERCISE: to improve flexibility, strength and mobility.  Demonstration, verbal and tactile cues throughout for technique.  L plantar fascia stretches: Standing calf stretch with toes on wall x 30" Seated figure-4 with hand pulling up on toes & ankle 2 x 30" Long-sitting with towel 2 x 30" Negative heel off edge of step - demonstrated but not attempted Plantar fascia massage with golf ball or frozen water bottle - pt also shown ice massage cup B side-stepping in slight crouch with GTB at midfeet 2 x 15' Fwd/back monster walk with GTB at ankles 3 x 20' each direction  NEUROMUSCULAR RE-EDUCATION: To improve balance, proprioception, coordination, and reduce fall risk. R SLS + L toe touch for balance + B UE 4-way pull on GTB x 10 - simulating perturbations from dog leash Tandem gait fwd & back w/in arm's reach of wall with intermittent fingertip support on wall 3 x 10'   09/07/22 THERAPEUTIC EXERCISE: to improve flexibility, strength and mobility.  Demonstration, verbal and tactile cues throughout for technique.  Rec Bike - L3 x 7 min  THERAPEUTIC ACTIVITIES: LE MMT 5xSTS = 10.56  sec Berg = 54/56, 52-55 lower risk for falls (> 25%)  = 6.00 sec Gait speed = 5.47 ft/sec FGA = 26/30, 25-28 = low risk fall    08/31/22 THERAPEUTIC EXERCISE: to improve flexibility, strength and mobility.  Demonstration, verbal and tactile cues throughout for technique.  GTB pallof press + rotation x 10 bil GTB B UE chop and lift x 10 each bil  NEUROMUSCULAR RE-EDUCATION: To improve balance, proprioception, coordination, and reduce fall risk.  SLS on blue foam oval + 4-way clocks x 5 bil with slider   08/27/22 THERAPEUTIC EXERCISE: to improve flexibility, strength and mobility.  Demonstration, verbal and tactile cues throughout for technique. Nustep L5x47min Stairs: 3x14 steps, WFL Standing marching from airex pad 2x10 no UE support Trunk rotation from airex 2x10   Standing hip abduction x 10 no UE support Backwards stepping x 10 bil  Step ups 8' x  15 bil no UE support Leg press 25# R/L x 15   PATIENT EDUCATION:  Education details: HEP update - plantar fascia stretches/massage   Person educated: Patient Education method: Explanation, Demonstration, Verbal cues, Handouts, and MedBridgeGO access code updated Education comprehension: verbalized understanding, returned demonstration, and needs further education  HOME EXERCISE PROGRAM: Access Code: [email or web address] URL: [email or web address] Date: 09/17/2022 Prepared by: Verta Ellen  Exercises - Seated Hamstring Stretch  - 1 x daily - 7 x weekly - 3 sets - 30 sec hold - Supine Quadriceps Stretch with Strap on Table  - 1 x daily - 7 x weekly - 3 sets - 30 sec hold - Supine Bridge  -  1 x daily - 3 x weekly - 3 sets - 10 reps - Squat with Counter Support  - 1 x daily - 3 x weekly - 3 sets - 10 reps - Split Stance Shoulder Row with Resistance  - 1 x daily - 3 x weekly - 2 sets - 10 reps - 5 sec hold - Scapular Retraction with Resistance Advanced  - 1 x daily - 3 x weekly - 2 sets - 10 reps - 5 sec hold - Staggered  Lat Pull Down with Resistance - Elbows Bent  - 1 x daily - 3 x weekly - 2 sets - 10 reps - 5 sec hold - Standing 3-Way Leg Reach with Resistance at Ankles and Counter Support  - 1 x daily - 3 x weekly - 3 sets - 10 reps - Forward T with Counter Support  - 1 x daily - 3 x weekly - 3 sets - 10 reps - Corner Balance Feet Together With Eyes Open  - 1 x daily - 3 x weekly - 3 reps - 30 sec hold - Corner Balance Feet Together: Eyes Open With Head Turns  - 1 x daily - 3 x weekly - 2 sets - 5 reps - Corner Balance Feet Together With Eyes Closed  - 1 x daily - 3 x weekly - 2 sets - 10 reps - 3 sec hold - Corner Balance Feet Together: Eyes Closed With Head Turns  - 1 x daily - 3 x weekly - 2 sets - 10 reps - 3 sec hold - Standing Diagonal Chop  - 1 x daily - 3 x weekly - 2 sets - 10 reps - 3 sec hold - Seated Plantar Fascia Stretch  - 2 x daily - 7 x weekly - 3 reps - 30 sec hold - Long Sitting Plantar Fascia Stretch with Towel  - 2-3 x daily - 7 x weekly - 3 reps - 30 sec hold - Plantar Fascia Stretch on Step  - 2-3 x daily - 7 x weekly - 3 reps - 30 sec hold - Gastroc Stretch with Foot at Wall  - 2-3 x daily - 7 x weekly - 3 reps - 30 sec hold - Seated Plantar Fascia Mobilization with Small Ball  - 2-3 x daily - 7 x weekly - 2-3 min hold - Foot Roller Plantar Massage  - 2-3 x daily - 7 x weekly - 2-3 min hold - Single Leg Stance Stabilization with Slow Front to Back Band Oscillations - Anterior Anchor  - 1 x daily - 3 x weekly - 2 sets - 10 reps - 3 sec hold - Single Leg Stance Stabilization with Slow Side to Side Band Oscillations - Lateral Anchor  - 1 x daily - 3 x weekly - 2 sets - 10 reps - 3 sec hold - Single Leg Stance Stabilization with Slow Side to Side Band Oscillations - Medial Anchor  - 1 x daily - 3 x weekly - 2 sets - 10 reps - 3 sec hold - Single Leg Stance Stabilization with Slow Front to Back Band Oscillations - Posterior Anchor  - 1 x daily - 3 x weekly - 2 sets - 10 reps - 3 sec hold -  Side Stepping with Resistance at Feet  - 1 x daily - 3 x weekly - 2 sets - 10 reps - Band Walks  - 1 x daily - 3 x weekly - 2 sets - 10 reps - Seated Flexion Stretch  with [employer]  - 1 x daily - 7 x weekly - 3 sets - 3 reps - 15 sec hold - Supine Hip Internal and External Rotation  - 1 x daily - 7 x weekly - 2 sets - 10 reps - 5 sec hold  *MedBridgeGO access code email/texted to patient   ASSESSMENT:  CLINICAL IMPRESSION: Assessed FGA, patient scored 27/30 which shows progress toward his goal. Continued with progression of golf related strengthening and mobility, improving core mobility and strength. Added supine hip ER/IR to HEP to help with hip rotation necessary for golf swing. He showed a good demonstration of keep core engaged with strengthening, cues were needed to keep breathing with exercises while keeping core engaged. Pt occasional shows some LOB with end range golf swing.  OBJECTIVE IMPAIRMENTS: decreased activity tolerance, decreased balance, decreased knowledge of condition, decreased mobility, difficulty walking, decreased strength, decreased safety awareness, impaired perceived functional ability, impaired flexibility, and impaired sensation.   ACTIVITY LIMITATIONS: carrying, lifting, bending, standing, squatting, stairs, transfers, bed mobility, locomotion level, and caring for others  PARTICIPATION LIMITATIONS: meal prep, cleaning, laundry, community activity, and occupation  PERSONAL FACTORS: Fitness, Past/current experiences, Time since onset of injury/illness/exacerbation, and 3+ comorbidities: DM-II, elevated BP, GERD, OSA, chronic LBP, h/o R RTC tendinitis, B patellofemoral pain syndrome, B hip pain  are also affecting patient's functional outcome.   REHAB POTENTIAL: Good  CLINICAL DECISION MAKING: Stable/uncomplicated  EVALUATION COMPLEXITY: Low   GOALS: Goals reviewed with patient? Yes  SHORT TERM GOALS: Target date: 08/31/2022   Patient will be independent  with initial HEP to improve outcomes and carryover.  Baseline:  Goal status: MET  08/13/22  2.  Patient will be educated on strategies to decrease risk of falls.  Baseline:  Goal status: MET  08/13/22  3.  Patient will demonstrate decreased 5xSTS time to </= 12 sec to decrease risk for falls with transitional mobility. Baseline: 16.54 sec, 15 sec (08/20/22) Goal status: MET  09/07/22 - 10.56 secs  LONG TERM GOALS: Target date: 09/28/2022   Patient will be independent with advanced/ongoing HEP to facilitate ability to maintain/progress functional gains from skilled physical therapy services. Baseline:  Goal status: IN PROGRESS  09/07/22 - Met for current HEP  2.  Patient will demonstrate improved B LE strength to >/= 4 to 4+/5 for improved stability and ease of mobility . Baseline: Refer to above MMT table Goal status: MET  09/07/22  3.  Patient will be report ability to ambulate for >/= 30 min w/o AD on variable surfaces with good safety to access community.  Baseline:  Goal status: MET  09/07/22 - pt able to walk 1.3 miles in 40 minutes  4.  Patient will be able to step up/down curb safely w/o AD for safety with community ambulation.  Baseline:  Goal status: MET  09/07/22   5.  Patient will report ability to navigate stairs to second floor of home without limitation due to fatigue by end of day. Baseline:  Goal status: MET  09/07/22  6.  Patient will improve Berg score to >/= 54/56 to improve safety and stability with ADLs in standing and reduce risk for falls.  Baseline: 51/56 Goal status: MET  09/07/22  7. Patient will improve FGA score to at least 28/30 to improve gait stability and reduce risk for falls. Baseline: 24/30 Goal status: IN PROGRESS  09/17/22 - 27/30  10.  Patient will report >/= 90% balance confidence on ABC scale to demonstrate improved functional ability.  Baseline: 1190 / 1600 = 74.4 % Goal status: IN PROGRESS  09/07/22 - 1160 / 1600 = 72.5 %   PLAN:  PT FREQUENCY:  2x/week  PT DURATION: 6-8 weeks  PLANNED INTERVENTIONS: Therapeutic exercises, Therapeutic activity, Neuromuscular re-education, Balance training, Gait training, Patient/Family education, Self Care, Joint mobilization, Stair training, DME instructions, Aquatic Therapy, Dry Needling, Electrical stimulation, Cryotherapy, Moist heat, Taping, Ultrasound, Manual therapy, and Re-evaluation  PLAN FOR NEXT SESSION: work on balance with STS and walking dogs on a leash; golf specific strengthening; progress core/lumbopelvic and postural flexibility and strengthening; review/update HEP as needed; static and dynamic balance training; incorporate trunk and shoulder flexibility and strengthening to improve golf swing   Antoin Dargis L Aniel Hubble, PTA 09/17/2022, 10:40 AM

## 2022-09-20 ENCOUNTER — Ambulatory Visit: Payer: Medicare Other | Admitting: Behavioral Health

## 2022-09-20 ENCOUNTER — Telehealth: Payer: Self-pay | Admitting: Family Medicine

## 2022-09-20 NOTE — Telephone Encounter (Signed)
Received [employer] Patient Assistance. Expiration date 02/29/24 Lot #  [id number] #5 boxes Patient made aware and will pickup at the front desk/is in the frig.

## 2022-09-21 ENCOUNTER — Ambulatory Visit: Payer: Medicare Other

## 2022-09-21 ENCOUNTER — Encounter: Payer: Self-pay | Admitting: Emergency Medicine

## 2022-09-21 DIAGNOSIS — M6281 Muscle weakness (generalized): Secondary | ICD-10-CM | POA: Diagnosis not present

## 2022-09-21 DIAGNOSIS — R262 Difficulty in walking, not elsewhere classified: Secondary | ICD-10-CM

## 2022-09-21 DIAGNOSIS — R2681 Unsteadiness on feet: Secondary | ICD-10-CM

## 2022-09-21 NOTE — Therapy (Signed)
OUTPATIENT PHYSICAL THERAPY TREATMENT      Patient Name: Noah Lane MRN: [id number] DOB:October 06, 1955, 67 y.o., male Today's Date: 09/21/2022   END OF SESSION:  PT End of Session - 09/21/22 0850     Visit Number 14    Date for PT Re-Evaluation 09/28/22    Authorization Type [employer]    PT Start Time 0848    PT Stop Time 0928    PT Time Calculation (min) 40 min    Activity Tolerance Patient tolerated treatment well    Behavior During Therapy WFL for tasks assessed/performed                        Past Medical History:  Diagnosis Date   Diabetes mellitus type 2 in obese    Mixed hyperlipidemia    Past Surgical History:  Procedure Laterality Date   COLON RESECTION N/A 04/27/2022   Procedure: LAPAROSCOPIC HAND ASSISTED RIGHT HEMI COLECTOMY, POSSIBLE OPEN;  Surgeon: Fritzi Mandes, MD;  Location: [facility];  Service: General;  Laterality: N/A;   COLONOSCOPY     HERNIA REPAIR  2012   Patient Active Problem List   Diagnosis Date Noted   Elevated blood pressure reading 05/04/2022   Colon adenocarcinoma (HCC) 05/04/2022   Gastroesophageal reflux disease 04/29/2022   Ileus, postoperative (HCC) 04/29/2022   Hypokalemia 04/28/2022   Hyperlipidemia 04/28/2022   Colonic obstruction (HCC) 04/25/2022   Hyponatremia 04/25/2022   OSA on CPAP 12/03/2021   Obesity (BMI 30.0-34.9) 12/03/2021   Mixed hyperlipidemia 10/20/2021   Strain of lumbar region 10/16/2021   Strain of calf muscle 07/22/2021   Patellofemoral pain syndrome of both knees 02/24/2021   Rotator cuff tendinitis, right 12/28/2019   Hip pain, bilateral 05/28/2019   Chronic bilateral low back pain without sciatica 05/28/2019   Diabetes mellitus type 2 in obese 05/28/2019   Erectile dysfunction 05/28/2019   Pain and swelling of left knee 03/12/2019   Lateral epicondylitis, right elbow 03/15/2017    PCP: Sharlene Dory, DO   REFERRING PROVIDER: Sharlene Dory, DO  REFERRING  DIAG:  R53.81 (ICD-10-CM) - Physical deconditioning  R26.89 (ICD-10-CM) - Balance disorder   THERAPY DIAG:  Muscle weakness (generalized)  Unsteadiness on feet  Difficulty in walking, not elsewhere classified  RATIONALE FOR EVALUATION AND TREATMENT: Rehabilitation  ONSET DATE: 04/27/22 - laparoscopic R hemi colectomy for colon cancer  NEXT MD VISIT: TBD   SUBJECTIVE:  SUBJECTIVE STATEMENT: Pt reports increased numbness in feet this morning.   PAIN: Are you having pain? No  PERTINENT HISTORY:  DM-II, elevated BP, GERD, OSA, chronic LBP, h/o R RTC tendinitis, B patellofemoral pain syndrome, B hip pain  PRECAUTIONS: None  WEIGHT BEARING RESTRICTIONS: No  FALLS:  Has patient fallen in last 6 months? 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  COGNITION: Overall cognitive status: History of cognitive impairments - at baseline - mostly short-term memory recall   SENSATION: WFL Intermittent numbness and tingling in soles of B feet - most common upon getting up out of a chair  MUSCLE  LENGTH: Hamstrings: mod tight L, mild tight R ITB: mod tight L, mild tight R Piriformis: mild tight L>R Hip flexors: mild tight L>R Quads: mild tight L>R Heelcord: NT  POSTURE:  No Significant postural limitations  LOWER EXTREMITY ROM:    Grossly WFL other than limitations due to tightness as above  LOWER EXTREMITY MMT:    MMT Right Eval Left Eval R 09/07/22 L 09/07/22  Hip flexion 4- 4 5 4+  Hip extension 4- 4- 4 4+  Hip abduction 4- 4- 4 4  Hip adduction 4- 4- 4+ 4+  Hip internal rotation 3+ 4- 4+ 4  Hip external rotation 3+ 3+ 4+ 4+  Knee flexion 4+ 4 5 4+  Knee extension 4+ 4+ 5 5  Ankle dorsiflexion 4+ 4+ 5 5  Ankle plantarflexion   5 5  Ankle inversion      Ankle eversion      (Blank rows = not tested)  BED MOBILITY:  Sit to supine Modified independence Supine to sit Modified independence Rolling to Right SBA  TRANSFERS: Assistive device utilized: None  Sit to stand: Complete Independence Stand to sit: Complete Independence Chair to chair: Complete Independence Floor:  NT  GAIT: Distance walked: 80 ft Assistive device utilized: None Level of assistance: Complete Independence Gait pattern: step through pattern, increased lateral sway Comments: Gait speed = 4.22 ft/sec  RAMP: Level of Assistance:  NT Assistive device utilized:  NT Ramp Comments:   CURB:  Level of Assistance:  NT Assistive device utilized:  NT Curb Comments:   STAIRS: (08/11/22)  Level of Assistance: Modified independence Stair Negotiation Technique: Alternating Pattern  Forwards with Single Rail on Left Number of Stairs: 14  Height of Stairs: 7"  Comments: Good reciprocal pattern but definite need for railing for balance/support  FUNCTIONAL TESTS:  5 times sit to stand: 16.54 sec; 08/20/22 - 15 sec Timed up and go (TUG): 7.81 sec 10 meter walk test: 7.78 sec Berg Balance Scale: (08/11/22) 51/56, 46-51 moderate risk for falls (>50%)  Functional gait assessment: (08/11/22) 24/30,  19-24 = medium risk fall   PATIENT SURVEYS:  ABC scale 1190 /  1600 = 74.4 %   TODAY'S TREATMENT:  09/21/22 THERAPEUTIC EXERCISE: to improve flexibility, strength and mobility.  Demonstration, verbal and tactile cues throughout for technique. Nustep L5x60min Lat pull + march GTB x 10  Shld ext with modified SLS GTB 2x10 Sideways OTIS with modified SLS x 10 bil Slider lunge 2x10 fwd and lateral TRX squat 2x10 TRX row x 12  09/17/22 THERAPEUTIC EXERCISE: to improve flexibility, strength and mobility.  Demonstration, verbal and tactile cues throughout for technique. FGA: 27/30 Golf swings blue TB each direction x 20  Supine hip ER/IR x 10 bil Supine bridge with step out x 10 bil Supine bridge with march x 10  09/15/22 THERAPEUTIC EXERCISE: to improve flexibility, strength and mobility.  Demonstration, verbal and tactile cues throughout for technique.  Chops both ways GTB 2x10  Seated lumbar stretch with green pball 3x15 sec Chop with blue wt ball x 10 bil Penguin walk with GTB at thighs x 10 all directions Side steps GTB at thighs stiff leg along the counter x 3 - rest break - x 3 again down and back as 1 rep  09/09/22 THERAPEUTIC EXERCISE: to improve flexibility, strength and mobility.  Demonstration, verbal and tactile cues throughout for technique.  L plantar fascia stretches: Standing calf stretch with toes on wall x 30" Seated figure-4 with hand pulling up on toes & ankle 2 x 30" Long-sitting with towel 2 x 30" Negative heel off edge of step - demonstrated but not attempted Plantar fascia massage with golf ball or frozen water bottle - pt also shown ice massage cup B side-stepping in slight crouch with GTB at midfeet 2 x 15' Fwd/back monster walk with GTB at ankles 3 x 20' each direction  NEUROMUSCULAR RE-EDUCATION: To improve balance, proprioception, coordination, and reduce fall risk. R SLS + L toe touch for balance + B UE 4-way pull on GTB x 10 - simulating perturbations  from dog leash Tandem gait fwd & back w/in arm's reach of wall with intermittent fingertip support on wall 3 x 10'   09/07/22 THERAPEUTIC EXERCISE: to improve flexibility, strength and mobility.  Demonstration, verbal and tactile cues throughout for technique.  Rec Bike - L3 x 7 min  THERAPEUTIC ACTIVITIES: LE MMT 5xSTS = 10.56 sec Berg = 54/56, 52-55 lower risk for falls (> 25%)  = 6.00 sec Gait speed = 5.47 ft/sec FGA = 26/30, 25-28 = low risk fall    08/31/22 THERAPEUTIC EXERCISE: to improve flexibility, strength and mobility.  Demonstration, verbal and tactile cues throughout for technique.  GTB pallof press + rotation x 10 bil GTB B UE chop and lift x 10 each bil  NEUROMUSCULAR RE-EDUCATION: To improve balance, proprioception, coordination, and reduce fall risk.  SLS on blue foam oval + 4-way clocks x 5 bil with slider   08/27/22 THERAPEUTIC EXERCISE: to improve flexibility, strength and mobility.  Demonstration, verbal and tactile cues throughout for technique. Nustep L5x50min Stairs: 3x14 steps, WFL Standing marching from airex pad 2x10 no UE support Trunk rotation from airex 2x10   Standing hip abduction x 10 no UE support Backwards stepping x 10 bil  Step ups 8' x  15 bil no UE support Leg press 25# R/L x 15   PATIENT EDUCATION:  Education details: HEP update - plantar fascia stretches/massage   Person educated: Patient Education method: Explanation, Demonstration, Verbal cues, Handouts, and MedBridgeGO access code updated Education comprehension: verbalized understanding, returned demonstration, and needs further education  HOME EXERCISE PROGRAM: Access Code:  [email or web address] URL: [email or web address] Date: 09/17/2022 Prepared by: Verta Ellen  Exercises - Seated Hamstring Stretch  - 1 x daily - 7 x weekly - 3 sets - 30 sec hold - Supine Quadriceps Stretch with Strap on Table  - 1 x daily - 7 x weekly - 3 sets - 30 sec hold - Supine Bridge  - 1  x daily - 3 x weekly - 3 sets - 10 reps - Squat with Counter Support  - 1 x daily - 3 x weekly - 3 sets - 10 reps - Split Stance Shoulder Row with Resistance  - 1 x daily - 3 x weekly - 2 sets - 10 reps - 5 sec hold - Scapular Retraction with Resistance Advanced  - 1 x daily - 3 x weekly - 2 sets - 10 reps - 5 sec hold - Staggered Lat Pull Down with Resistance - Elbows Bent  - 1 x daily - 3 x weekly - 2 sets - 10 reps - 5 sec hold - Standing 3-Way Leg Reach with Resistance at Ankles and Counter Support  - 1 x daily - 3 x weekly - 3 sets - 10 reps - Forward T with Counter Support  - 1 x daily - 3 x weekly - 3 sets - 10 reps - Corner Balance Feet Together With Eyes Open  - 1 x daily - 3 x weekly - 3 reps - 30 sec hold - Corner Balance Feet Together: Eyes Open With Head Turns  - 1 x daily - 3 x weekly - 2 sets - 5 reps - Corner Balance Feet Together With Eyes Closed  - 1 x daily - 3 x weekly - 2 sets - 10 reps - 3 sec hold - Corner Balance Feet Together: Eyes Closed With Head Turns  - 1 x daily - 3 x weekly - 2 sets - 10 reps - 3 sec hold - Standing Diagonal Chop  - 1 x daily - 3 x weekly - 2 sets - 10 reps - 3 sec hold - Seated Plantar Fascia Stretch  - 2 x daily - 7 x weekly - 3 reps - 30 sec hold - Long Sitting Plantar Fascia Stretch with Towel  - 2-3 x daily - 7 x weekly - 3 reps - 30 sec hold - Plantar Fascia Stretch on Step  - 2-3 x daily - 7 x weekly - 3 reps - 30 sec hold - Gastroc Stretch with Foot at Wall  - 2-3 x daily - 7 x weekly - 3 reps - 30 sec hold - Seated Plantar Fascia Mobilization with Small Ball  - 2-3 x daily - 7 x weekly - 2-3 min hold - Foot Roller Plantar Massage  - 2-3 x daily - 7 x weekly - 2-3 min hold - Single Leg Stance Stabilization with Slow Front to Back Band Oscillations - Anterior Anchor  - 1 x daily - 3 x weekly - 2 sets - 10 reps - 3 sec hold - Single Leg Stance Stabilization with Slow Side to Side Band Oscillations - Lateral Anchor  - 1 x daily - 3 x weekly - 2  sets - 10 reps - 3 sec hold - Single Leg Stance Stabilization with Slow Side to Side Band Oscillations - Medial Anchor  - 1 x daily - 3 x weekly - 2 sets - 10 reps - 3 sec hold - Single Leg Stance Stabilization with Slow Front to Back Band Oscillations - Posterior Anchor  - 1  x daily - 3 x weekly - 2 sets - 10 reps - 3 sec hold - Side Stepping with Resistance at Feet  - 1 x daily - 3 x weekly - 2 sets - 10 reps - Band Walks  - 1 x daily - 3 x weekly - 2 sets - 10 reps - Seated Flexion Stretch with Swiss Ball  - 1 x daily - 7 x weekly - 3 sets - 3 reps - 15 sec hold - Supine Hip Internal and External Rotation  - 1 x daily - 7 x weekly - 2 sets - 10 reps - 5 sec hold  *MedBridgeGO access code email/texted to patient   ASSESSMENT:  CLINICAL IMPRESSION: Erling arrived today with no issues. We focused on core strengthening today to improve stability with golf swings. He had LOB a few times throughout the session but was able to self recover. Coming up on end of POC, will most likely be able to transition soon to HEP.  OBJECTIVE IMPAIRMENTS: decreased activity tolerance, decreased balance, decreased knowledge of condition, decreased mobility, difficulty walking, decreased strength, decreased safety awareness, impaired perceived functional ability, impaired flexibility, and impaired sensation.   ACTIVITY LIMITATIONS: carrying, lifting, bending, standing, squatting, stairs, transfers, bed mobility, locomotion level, and caring for others  PARTICIPATION LIMITATIONS: meal prep, cleaning, laundry, community activity, and occupation  PERSONAL FACTORS: Fitness, Past/current experiences, Time since onset of injury/illness/exacerbation, and 3+ comorbidities: DM-II, elevated BP, GERD, OSA, chronic LBP, h/o R RTC tendinitis, B patellofemoral pain syndrome, B hip pain  are also affecting patient's functional outcome.   REHAB POTENTIAL: Good  CLINICAL DECISION MAKING: Stable/uncomplicated  EVALUATION  COMPLEXITY: Low   GOALS: Goals reviewed with patient? Yes  SHORT TERM GOALS: Target date: 08/31/2022   Patient will be independent with initial HEP to improve outcomes and carryover.  Baseline:  Goal status: MET  08/13/22  2.  Patient will be educated on strategies to decrease risk of falls.  Baseline:  Goal status: MET  08/13/22  3.  Patient will demonstrate decreased 5xSTS time to </= 12 sec to decrease risk for falls with transitional mobility. Baseline: 16.54 sec, 15 sec (08/20/22) Goal status: MET  09/07/22 - 10.56 secs  LONG TERM GOALS: Target date: 09/28/2022   Patient will be independent with advanced/ongoing HEP to facilitate ability to maintain/progress functional gains from skilled physical therapy services. Baseline:  Goal status: IN PROGRESS  09/07/22 - Met for current HEP  2.  Patient will demonstrate improved B LE strength to >/= 4 to 4+/5 for improved stability and ease of mobility . Baseline: Refer to above MMT table Goal status: MET  09/07/22  3.  Patient will be report ability to ambulate for >/= 30 min w/o AD on variable surfaces with good safety to access community.  Baseline:  Goal status: MET  09/07/22 - pt able to walk 1.3 miles in 40 minutes  4.  Patient will be able to step up/down curb safely w/o AD for safety with community ambulation.  Baseline:  Goal status: MET  09/07/22   5.  Patient will report ability to navigate stairs to second floor of home without limitation due to fatigue by end of day. Baseline:  Goal status: MET  09/07/22  6.  Patient will improve Berg score to >/= 54/56 to improve safety and stability with ADLs in standing and reduce risk for falls.  Baseline: 51/56 Goal status: MET  09/07/22  7. Patient will improve FGA score to at least 28/30 to  improve gait stability and reduce risk for falls. Baseline: 24/30 Goal status: IN PROGRESS  09/17/22 - 27/30  10.  Patient will report >/= 90% balance confidence on ABC scale to demonstrate improved  functional ability. Baseline: 1190 / 1600 = 74.4 % Goal status: IN PROGRESS  09/07/22 - 1160 / 1600 = 72.5 %   PLAN:  PT FREQUENCY: 2x/week  PT DURATION: 6-8 weeks  PLANNED INTERVENTIONS: Therapeutic exercises, Therapeutic activity, Neuromuscular re-education, Balance training, Gait training, Patient/Family education, Self Care, Joint mobilization, Stair training, DME instructions, Aquatic Therapy, Dry Needling, Electrical stimulation, Cryotherapy, Moist heat, Taping, Ultrasound, Manual therapy, and Re-evaluation  PLAN FOR NEXT SESSION: work on balance with STS and walking dogs on a leash; golf specific strengthening; progress core/lumbopelvic and postural flexibility and strengthening; review/update HEP as needed; static and dynamic balance training; incorporate trunk and shoulder flexibility and strengthening to improve golf swing   Leeroy Lovings L Arra Connaughton, PTA 09/21/2022, 10:33 AM

## 2022-09-23 ENCOUNTER — Ambulatory Visit (INDEPENDENT_AMBULATORY_CARE_PROVIDER_SITE_OTHER): Payer: Medicare Other | Admitting: Emergency Medicine

## 2022-09-23 VITALS — Ht 73.0 in | Wt 251.0 lb

## 2022-09-23 DIAGNOSIS — Z0000 Encounter for general adult medical examination without abnormal findings: Secondary | ICD-10-CM | POA: Diagnosis not present

## 2022-09-23 NOTE — Patient Instructions (Signed)
Noah Lane , Thank you for taking time to come for your Medicare Wellness Visit. I appreciate your ongoing commitment to your health goals. Please review the following plan we discussed and let me know if I can assist you in the future.   Referrals/Orders/Follow-Ups/Clinician Recommendations: Call to schedule a diabetic eye exam. Discuss shingles vaccine with your doctor at your next appointment.  This is a list of the screening recommended for you and due dates:  Health Maintenance  Topic Date Due   DTaP/Tdap/Td vaccine (1 - Tdap) Never done   Complete foot exam   01/06/2022   COVID-19 Vaccine (6 - 2023-24 season) 03/21/2022   Eye exam for diabetics  09/08/2022   Yearly kidney health urinalysis for diabetes  10/21/2022   Flu Shot  09/30/2022   Hemoglobin A1C  10/21/2022   Yearly kidney function blood test for diabetes  08/26/2023   Medicare Annual Wellness Visit  09/23/2023   Colon Cancer Screening  07/04/2024   Pneumonia Vaccine  Completed   Hepatitis C Screening  Completed   HPV Vaccine  Aged Out   Zoster (Shingles) Vaccine  Discontinued    Advanced directives: (Copy Requested) Please bring a copy of your health care power of attorney and living will to the office to be added to your chart at your convenience.  Next Medicare Annual Wellness Visit scheduled for next year: Yes  Preventive Care 69 Years and Older, Male  Preventive care refers to lifestyle choices and visits with your health care provider that can promote health and wellness. What does preventive care include? A yearly physical exam. This is also called an annual well check. Dental exams once or twice a year. Routine eye exams. Ask your health care provider how often you should have your eyes checked. Personal lifestyle choices, including: Daily care of your teeth and gums. Regular physical activity. Eating a healthy diet. Avoiding tobacco and drug use. Limiting alcohol use. Practicing safe sex. Taking low  doses of aspirin every day. Taking vitamin and mineral supplements as recommended by your health care provider. What happens during an annual well check? The services and screenings done by your health care provider during your annual well check will depend on your age, overall health, lifestyle risk factors, and family history of disease. Counseling  Your health care provider may ask you questions about your: Alcohol use. Tobacco use. Drug use. Emotional well-being. Home and relationship well-being. Sexual activity. Eating habits. History of falls. Memory and ability to understand (cognition). Work and work Astronomer. Screening  You may have the following tests or measurements: Height, weight, and BMI. Blood pressure. Lipid and cholesterol levels. These may be checked every 5 years, or more frequently if you are over 56 years old. Skin check. Lung cancer screening. You may have this screening every year starting at age 80 if you have a 30-pack-year history of smoking and currently smoke or have quit within the past 15 years. Fecal occult blood test (FOBT) of the stool. You may have this test every year starting at age 40. Flexible sigmoidoscopy or colonoscopy. You may have a sigmoidoscopy every 5 years or a colonoscopy every 10 years starting at age 24. Prostate cancer screening. Recommendations will vary depending on your family history and other risks. Hepatitis C blood test. Hepatitis B blood test. Sexually transmitted disease (STD) testing. Diabetes screening. This is done by checking your blood sugar (glucose) after you have not eaten for a while (fasting). You may have this done every 1-3  years. Abdominal aortic aneurysm (AAA) screening. You may need this if you are a current or former smoker. Osteoporosis. You may be screened starting at age 41 if you are at high risk. Talk with your health care provider about your test results, treatment options, and if necessary, the need  for more tests. Vaccines  Your health care provider may recommend certain vaccines, such as: Influenza vaccine. This is recommended every year. Tetanus, diphtheria, and acellular pertussis (Tdap, Td) vaccine. You may need a Td booster every 10 years. Zoster vaccine. You may need this after age 19. Pneumococcal 13-valent conjugate (PCV13) vaccine. One dose is recommended after age 71. Pneumococcal polysaccharide (PPSV23) vaccine. One dose is recommended after age 4. Talk to your health care provider about which screenings and vaccines you need and how often you need them. This information is not intended to replace advice given to you by your health care provider. Make sure you discuss any questions you have with your health care provider. Document Released: 03/14/2015 Document Revised: 11/05/2015 Document Reviewed: 12/17/2014 Elsevier Interactive Patient Education  2017 [employer].  Fall Prevention in the Home Falls can cause injuries. They can happen to people of all ages. There are many things you can do to make your home safe and to help prevent falls. What can I do on the outside of my home? Regularly fix the edges of walkways and driveways and fix any cracks. Remove anything that might make you trip as you walk through a door, such as a raised step or threshold. Trim any bushes or trees on the path to your home. Use bright outdoor lighting. Clear any walking paths of anything that might make someone trip, such as rocks or tools. Regularly check to see if handrails are loose or broken. Make sure that both sides of any steps have handrails. Any raised decks and porches should have guardrails on the edges. Have any leaves, snow, or ice cleared regularly. Use sand or salt on walking paths during winter. Clean up any spills in your garage right away. This includes oil or grease spills. What can I do in the bathroom? Use night lights. Install grab bars by the toilet and in the tub and  shower. Do not use towel bars as grab bars. Use non-skid mats or decals in the tub or shower. If you need to sit down in the shower, use a plastic, non-slip stool. Keep the floor dry. Clean up any water that spills on the floor as soon as it happens. Remove soap buildup in the tub or shower regularly. Attach bath mats securely with double-sided non-slip rug tape. Do not have throw rugs and other things on the floor that can make you trip. What can I do in the bedroom? Use night lights. Make sure that you have a light by your bed that is easy to reach. Do not use any sheets or blankets that are too big for your bed. They should not hang down onto the floor. Have a firm chair that has side arms. You can use this for support while you get dressed. Do not have throw rugs and other things on the floor that can make you trip. What can I do in the kitchen? Clean up any spills right away. Avoid walking on wet floors. Keep items that you use a lot in easy-to-reach places. If you need to reach something above you, use a strong step stool that has a grab bar. Keep electrical cords out of the way. Do  not use floor polish or wax that makes floors slippery. If you must use wax, use non-skid floor wax. Do not have throw rugs and other things on the floor that can make you trip. What can I do with my stairs? Do not leave any items on the stairs. Make sure that there are handrails on both sides of the stairs and use them. Fix handrails that are broken or loose. Make sure that handrails are as long as the stairways. Check any carpeting to make sure that it is firmly attached to the stairs. Fix any carpet that is loose or worn. Avoid having throw rugs at the top or bottom of the stairs. If you do have throw rugs, attach them to the floor with carpet tape. Make sure that you have a light switch at the top of the stairs and the bottom of the stairs. If you do not have them, ask someone to add them for  you. What else can I do to help prevent falls? Wear shoes that: Do not have high heels. Have rubber bottoms. Are comfortable and fit you well. Are closed at the toe. Do not wear sandals. If you use a stepladder: Make sure that it is fully opened. Do not climb a closed stepladder. Make sure that both sides of the stepladder are locked into place. Ask someone to hold it for you, if possible. Clearly mark and make sure that you can see: Any grab bars or handrails. First and last steps. Where the edge of each step is. Use tools that help you move around (mobility aids) if they are needed. These include: Canes. Walkers. Scooters. Crutches. Turn on the lights when you go into a dark area. Replace any light bulbs as soon as they burn out. Set up your furniture so you have a clear path. Avoid moving your furniture around. If any of your floors are uneven, fix them. If there are any pets around you, be aware of where they are. Review your medicines with your doctor. Some medicines can make you feel dizzy. This can increase your chance of falling. Ask your doctor what other things that you can do to help prevent falls. This information is not intended to replace advice given to you by your health care provider. Make sure you discuss any questions you have with your health care provider. Document Released: 12/12/2008 Document Revised: 07/24/2015 Document Reviewed: 03/22/2014 Elsevier Interactive Patient Education  2017 [employer].

## 2022-09-23 NOTE — Progress Notes (Signed)
Subjective:  Per patient no change in vitals since last visit; unable to obtain new vitals due to this being a telehealth visit.  Patient was unable to self-report vital signs via telehealth due to a lack of equipment at home.    Noah Lane is a 67 y.o. male who presents for an Initial Medicare Annual Wellness Visit.  Visit Complete: Virtual  I connected with  Noah Lane on 09/23/22 by a audio enabled telemedicine application and verified that I am speaking with the correct person using two identifiers.  Patient Location: Home  Provider Location: Home Office  I discussed the limitations of evaluation and management by telemedicine. The patient expressed understanding and agreed to proceed.   Review of Systems     Cardiac Risk Factors include: advanced age (>66men, >51 women);male gender;diabetes mellitus;dyslipidemia;obesity (BMI >30kg/m2);Other (see comment), Risk factor comments: OSA uses Cpap     Objective:    Today's Vitals   09/23/22 0831  Weight: 251 lb (113.9 kg)  Height: 6\' 1"  (1.854 m)   Body mass index is 33.12 kg/m.     09/23/2022    8:52 AM 08/03/2022    8:07 AM 05/14/2022   11:30 AM 04/27/2022    6:48 AM 04/25/2022   11:46 AM 10/19/2021    2:05 PM 02/24/2021    3:34 PM  Advanced Directives  Does Patient Have a Medical Advance Directive? Yes Yes Yes Yes Yes Yes Yes  Type of Estate agent of [place];Living will Healthcare Power of [place];Living will Healthcare Power of [place];Living will Healthcare Power of [place];Living will Healthcare Power of [employer] of [place];Living will Living will;Healthcare Power of Attorney  Does patient want to make changes to medical advance directive? No - Patient declined No - Patient declined  No - Patient declined No - Patient declined No - Patient declined No - Patient declined  Copy of Healthcare Power of Attorney in Chart? No - copy requested No - copy requested  No - copy  requested  No - copy requested No - copy requested  Would patient like information on creating a medical advance directive?      No - Patient declined     Current Medications (verified) Outpatient Encounter Medications as of 09/23/2022  Medication Sig   ALPHA-LIPOIC ACID PO Take 500 mg by mouth daily.   atorvastatin (LIPITOR) 20 MG tablet Take 1 tablet (20 mg total) by mouth daily.   cyanocobalamin (VITAMIN B12) 1000 MCG tablet Take 1,000 mcg by mouth daily.   Garlic 500 MG CAPS Take 1 capsule by mouth daily.   Green Tea, Camellia sinensis, (GREEN TEA EXTRACT PO) Take 500 mg by mouth 2 (two) times daily.   metFORMIN (GLUCOPHAGE-XR) 500 MG 24 hr tablet TAKE 1 TABLET BY MOUTH DAILY WITH BREAKFAST   olmesartan (BENICAR) 20 MG tablet TAKE 1 TABLET BY MOUTH DAILY   polycarbophil (FIBERCON) 625 MG tablet Take 1 tablet (625 mg total) by mouth 2 (two) times daily.   PRESCRIPTION MEDICATION Inject 4 mLs as directed as needed (erectile disfunction). prostaglandin   Semaglutide,0.25 or 0.5MG /DOS, (OZEMPIC, 0.25 OR 0.5 MG/DOSE,) 2 MG/3ML SOPN DIAL AND INJECT UNDER THE SKIN 0.25 MG WEEKLY FOR 4 WEEKS THEN DIAL AND INJECT 0.5 MG WEEKLY THEREAFTER   feeding supplement (ENSURE ENLIVE / ENSURE PLUS) LIQD Take 237 mLs by mouth 3 (three) times daily with meals. (Patient not taking: Reported on 09/23/2022)   meloxicam (MOBIC) 15 MG tablet Take 1 tablet (15 mg total) by mouth  daily. (Patient not taking: Reported on 09/23/2022)   nitroGLYCERIN (NITRODUR - DOSED IN MG/24 HR) 0.2 mg/hr patch Cut and apply 1/4 patch to most painful area q24h. (Patient not taking: Reported on 09/23/2022)   No facility-administered encounter medications on file as of 09/23/2022.    Allergies (verified) Patient has no known allergies.   History: Past Medical History:  Diagnosis Date   Diabetes mellitus type 2 in obese    Mixed hyperlipidemia    Past Surgical History:  Procedure Laterality Date   COLON RESECTION N/A 04/27/2022    Procedure: LAPAROSCOPIC HAND ASSISTED RIGHT HEMI COLECTOMY, POSSIBLE OPEN;  Surgeon: Fritzi Mandes, MD;  Location: [facility];  Service: General;  Laterality: N/A;   COLONOSCOPY     HERNIA REPAIR  2012   Family History  Problem Relation Age of Onset   Heart disease Mother    Diabetes Father    Cancer Father        post age of 67   Colon cancer Neg Hx    Colon polyps Neg Hx    Esophageal cancer Neg Hx    Rectal cancer Neg Hx    Stomach cancer Neg Hx    Social History   Socioeconomic History   Marital status: Married    Spouse name: [name]   Number of children: Not on file   Years of education: Not on file   Highest education level: Bachelor's degree (e.g., BA, AB, BS)  Occupational History   Occupation: retired    Comment: self employed-still working some  Tobacco Use   Smoking status: Former    Current packs/day: 0.00    Average packs/day: 0.5 packs/day for 15.0 years (7.5 ttl pk-yrs)    Types: Cigarettes    Start date: 37    Quit date: 1993    Years since quitting: 31.5   Smokeless tobacco: Never  Vaping Use   Vaping status: Never Used  Substance and Sexual Activity   Alcohol use: Yes    Alcohol/week: 1.0 standard drink of alcohol    Types: 1 Glasses of wine per week    Comment: 1.5 4oz glasses per week of wine   Drug use: Never   Sexual activity: Not Currently  Other Topics Concern   Not on file  Social History Narrative   Not on file   Social Determinants of Health   Financial Resource Strain: Low Risk  (09/23/2022)   Overall Financial Resource Strain (CARDIA)    Difficulty of Paying Living Expenses: Not hard at all  Food Insecurity: No Food Insecurity (09/23/2022)   Hunger Vital Sign    Worried About Running Out of Food in the Last Year: Never true    Ran Out of Food in the Last Year: Never true  Transportation Needs: No Transportation Needs (09/23/2022)   PRAPARE - Administrator, Civil Service (Medical): No    Lack of Transportation  (Non-Medical): No  Physical Activity: Sufficiently Active (09/23/2022)   Exercise Vital Sign    Days of Exercise per Week: 7 days    Minutes of Exercise per Session: 40 min  Recent Concern: Physical Activity - Insufficiently Active (08/16/2022)   Exercise Vital Sign    Days of Exercise per Week: 4 days    Minutes of Exercise per Session: 30 min  Stress: No Stress Concern Present (09/23/2022)   [employer] of Occupational Health - Occupational Stress Questionnaire    Feeling of Stress : Only a little  Recent Concern: Stress -  Stress Concern Present (08/16/2022)   [employer] of Occupational Health - Occupational Stress Questionnaire    Feeling of Stress : To some extent  Social Connections: Socially Integrated (09/23/2022)   Social Connection and Isolation Panel [NHANES]    Frequency of Communication with Friends and Family: More than three times a week    Frequency of Social Gatherings with Friends and Family: Once a week    Attends Religious Services: More than 4 times per year    Active Member of [employer] or Organizations: Yes    Attends Engineer, structural: More than 4 times per year    Marital Status: Married    Tobacco Counseling Counseling given: Not Answered   Clinical Intake:  Pre-visit preparation completed: Yes  Pain : No/denies pain     BMI - recorded: 33.12 Nutritional Status: BMI > 30  Obese Nutritional Risks: Nausea/ vomitting/ diarrhea Diabetes: Yes CBG done?: No Did pt. bring in CBG monitor from home?: No  How often do you need to have someone help you when you read instructions, pamphlets, or other written materials from your doctor or pharmacy?: 1 - Never  Interpreter Needed?: No  Information entered by :: Tora Kindred, CMA   Activities of Daily Living    09/23/2022    8:35 AM 04/25/2022    8:00 PM  In your present state of health, do you have any difficulty performing the following activities:  Hearing? 0 0  Vision? 0 0   Difficulty concentrating or making decisions? 0 1  Walking or climbing stairs? 1 0  Comment gets fatigued   Dressing or bathing? 0 0  Doing errands, shopping? 0 0  Preparing Food and eating ? N   Using the Toilet? N   In the past six months, have you accidently leaked urine? N   Do you have problems with loss of bowel control? N   Managing your Medications? N   Managing your Finances? N   Housekeeping or managing your Housekeeping? N     Patient Care Team: Sharlene Dory, DO as PCP - General (Family Medicine) Myna Hidalgo Rose Phi, MD as Medical Oncologist (Oncology) [place], Lawana Chambers, LMFT [facility]) Meryl Dare, MD as Consulting Physician (Gastroenterology)  Indicate any recent Medical Services you may have received from other than [facility] providers in the past year (date may be approximate).     Assessment:   This is a routine wellness examination for [facility]  Hearing/Vision screen Hearing Screening - Comments:: Has some hearing loss and is planning to see audiologist  Dietary issues and exercise activities discussed:     Goals Addressed               This Visit's Progress     Patient Stated (pt-stated)        Build up my strength to be able to do a 4 mile walk      Depression Screen    09/23/2022    8:49 AM 05/14/2022   11:43 AM 10/20/2021    7:33 AM 05/23/2020    4:06 PM  PHQ 2/9 Scores  PHQ - 2 Score 0 0 0 0  PHQ- 9 Score 0       Fall Risk    09/23/2022    8:54 AM  Fall Risk   Falls in the past year? 1  Number falls in past yr: 1  Injury with Fall? 1  Risk for fall due to : History of fall(s)  Risk for fall  due to: Comment patient seeing PT for falls prevention and balance  Follow up Falls prevention discussed    MEDICARE RISK AT HOME:  Medicare Risk at Home - 09/23/22 0856     Any stairs in or around the home? Yes    If so, are there any without handrails? No    Home free of loose throw rugs in walkways, pet beds,  electrical cords, etc? No    Adequate lighting in your home to reduce risk of falls? Yes    Life alert? Yes    Use of a cane, walker or w/c? No    Grab bars in the bathroom? Yes    Shower chair or bench in shower? Yes    Elevated toilet seat or a handicapped toilet? Yes             TIMED UP AND GO:  Was the test performed? No    Cognitive Function:        09/23/2022    8:59 AM  6CIT Screen  What Year? 0 points  What month? 0 points  What time? 0 points  Count back from 20 0 points  Months in reverse 0 points  Repeat phrase 0 points  Total Score 0 points    Immunizations Immunization History  Administered Date(s) Administered   Fluad Quad(high Dose 65+) 01/06/2021   Influenza,inj,Quad PF,6+ Mos 11/22/2016   Influenza-Unspecified 01/24/2022   PFIZER(Purple Top)SARS-COV-2 Vaccination 03/22/2019, 04/12/2019, 12/06/2019, 01/24/2022   PNEUMOCOCCAL CONJUGATE-20 01/06/2021   PPD Test 05/21/2020   Pfizer Covid-19 Vaccine Bivalent Booster 15yrs & up 12/08/2020    TDAP status: Up to date per patient. Had 8-9 years ago in [place].  Flu Vaccine status: Up to date  Pneumococcal vaccine status: Up to date  Covid-19 vaccine status: Information provided on how to obtain vaccines.   Qualifies for Shingles Vaccine? Yes   Zostavax completed No   Shingrix Completed?: No.    Education has been provided regarding the importance of this vaccine. Patient has been advised to call insurance company to determine out of pocket expense if they have not yet received this vaccine. Advised may also receive vaccine at local pharmacy or Health Dept. Verbalized acceptance and understanding.  Screening Tests Health Maintenance  Topic Date Due   DTaP/Tdap/Td (1 - Tdap) Never done   FOOT EXAM  01/06/2022   COVID-19 Vaccine (6 - 2023-24 season) 03/21/2022   OPHTHALMOLOGY EXAM  09/08/2022   Diabetic kidney evaluation - Urine ACR  10/21/2022   INFLUENZA VACCINE  09/30/2022   HEMOGLOBIN A1C   10/21/2022   Diabetic kidney evaluation - eGFR measurement  08/26/2023   Medicare Annual Wellness (AWV)  09/23/2023   Colonoscopy  07/04/2024   Pneumonia Vaccine 47+ Years old  Completed   Hepatitis C Screening  Completed   HPV VACCINES  Aged Out   Zoster Vaccines- Shingrix  Discontinued    Health Maintenance  Health Maintenance Due  Topic Date Due   DTaP/Tdap/Td (1 - Tdap) Never done   FOOT EXAM  01/06/2022   COVID-19 Vaccine (6 - 2023-24 season) 03/21/2022   OPHTHALMOLOGY EXAM  09/08/2022   Diabetic kidney evaluation - Urine ACR  10/21/2022    Colorectal cancer screening: Type of screening: Colonoscopy. Completed 07/05/22. Repeat every 2 years  Lung Cancer Screening: (Low Dose CT Chest recommended if Age 44-80 years, 20 pack-year currently smoking OR have quit w/in 15years.) does not qualify.   Lung Cancer Screening Referral: n/a  Additional Screening:  Hepatitis C  Screening: does qualify; Completed 01/09/21  Vision Screening: Recommended annual ophthalmology exams for early detection of glaucoma and other disorders of the eye. Is the patient up to date with their annual eye exam?  No  Who is the provider or what is the name of the office in which the patient attends annual eye exams? [email or web address] If pt is not established with a provider, would they like to be referred to a provider to establish care? No .   Dental Screening: Recommended annual dental exams for proper oral hygiene  Diabetic Foot Exam:  06 /17/24 per patient last OV  Community Resource Referral / Chronic Care Management: CRR required this visit?  No   CCM required this visit?  No    Plan:     I have personally reviewed and noted the following in the patient's chart:   Medical and social history Use of alcohol, tobacco or illicit drugs  Current medications and supplements including opioid prescriptions. Patient is not currently taking opioid prescriptions. Functional ability and status Nutritional  status Physical activity Advanced directives List of other physicians Hospitalizations, surgeries, and ER visits in previous 12 months Vitals Screenings to include cognitive, depression, and falls Referrals and appointments  In addition, I have reviewed and discussed with patient certain preventive protocols, quality metrics, and best practice recommendations. A written personalized care plan for preventive services as well as general preventive health recommendations were provided to patient.     Tora Kindred, CMA   09/23/2022   After Visit Summary: (MyChart) Due to this being a telephonic visit, the after visit summary with patients personalized plan was offered to patient via MyChart   Nurse Notes:  Patient is due for diabetic eye exam. He will call to schedule. Patient wants to discuss Shingrix vaccine at his next OV.

## 2022-09-24 ENCOUNTER — Encounter: Payer: Self-pay | Admitting: Physical Therapy

## 2022-09-24 ENCOUNTER — Ambulatory Visit: Payer: Medicare Other | Admitting: Family Medicine

## 2022-09-24 ENCOUNTER — Ambulatory Visit: Payer: Medicare Other | Admitting: Physical Therapy

## 2022-09-24 DIAGNOSIS — R2681 Unsteadiness on feet: Secondary | ICD-10-CM

## 2022-09-24 DIAGNOSIS — M6281 Muscle weakness (generalized): Secondary | ICD-10-CM | POA: Diagnosis not present

## 2022-09-24 DIAGNOSIS — R262 Difficulty in walking, not elsewhere classified: Secondary | ICD-10-CM

## 2022-09-24 NOTE — Therapy (Signed)
OUTPATIENT PHYSICAL THERAPY TREATMENT / DISCHARGE SUMMARY  Progress Note  Reporting Period 09/07/2022 to 09/24/2022  See note below for Objective Data and Assessment of Progress/Goals.      Patient Name: Noah Lane MRN: [id number] DOB:14-Jul-1955, 67 y.o., male Today's Date: 09/24/2022   END OF SESSION:  PT End of Session - 09/24/22 0847     Visit Number 15    Date for PT Re-Evaluation 09/28/22    Authorization Type [employer]    PT Start Time 0847    PT Stop Time 0928    PT Time Calculation (min) 41 min    Activity Tolerance Patient tolerated treatment well    Behavior During Therapy [facility] for tasks assessed/performed                         Past Medical History:  Diagnosis Date   Diabetes mellitus type 2 in obese    Mixed hyperlipidemia    Past Surgical History:  Procedure Laterality Date   COLON RESECTION N/A 04/27/2022   Procedure: LAPAROSCOPIC HAND ASSISTED RIGHT HEMI COLECTOMY, POSSIBLE OPEN;  Surgeon: Fritzi Mandes, MD;  Location: [facility];  Service: General;  Laterality: N/A;   COLONOSCOPY     HERNIA REPAIR  2012   Patient Active Problem List   Diagnosis Date Noted   Elevated blood pressure reading 05/04/2022   Colon adenocarcinoma (HCC) 05/04/2022   Gastroesophageal reflux disease 04/29/2022   Ileus, postoperative (HCC) 04/29/2022   Hypokalemia 04/28/2022   Hyperlipidemia 04/28/2022   Colonic obstruction (HCC) 04/25/2022   Hyponatremia 04/25/2022   OSA on CPAP 12/03/2021   Obesity (BMI 30.0-34.9) 12/03/2021   Mixed hyperlipidemia 10/20/2021   Strain of lumbar region 10/16/2021   Strain of calf muscle 07/22/2021   Patellofemoral pain syndrome of both knees 02/24/2021   Rotator cuff tendinitis, right 12/28/2019   Hip pain, bilateral 05/28/2019   Chronic bilateral low back pain without sciatica 05/28/2019   Diabetes mellitus type 2 in obese 05/28/2019   Erectile dysfunction 05/28/2019   Pain and swelling of left knee 03/12/2019    Lateral epicondylitis, right elbow 03/15/2017   Diverticulosis of large intestine without hemorrhage 02/25/2014    PCP: Sharlene Dory, DO   REFERRING PROVIDER: Sharlene Dory, DO  REFERRING DIAG:  X91.47 (ICD-10-CM) - Physical deconditioning  R26.89 (ICD-10-CM) - Balance disorder   THERAPY DIAG:  Muscle weakness (generalized)  Unsteadiness on feet  Difficulty in walking, not elsewhere classified  RATIONALE FOR EVALUATION AND TREATMENT: Rehabilitation  ONSET DATE: 04/27/22 - laparoscopic R hemi colectomy for colon cancer  NEXT MD VISIT: TBD   SUBJECTIVE:  SUBJECTIVE STATEMENT: PT reports if he sits for a long time he gets symptoms of neuropathy - numbness and tingling.  PAIN: Are you having pain? No  PERTINENT HISTORY:  DM-II, elevated BP, GERD, OSA, chronic LBP, h/o R RTC tendinitis, B patellofemoral pain syndrome, B hip pain  PRECAUTIONS: None  WEIGHT BEARING RESTRICTIONS: No  FALLS:  Has patient fallen in last 6 months? 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  COGNITION: Overall cognitive status: History of cognitive impairments - at baseline - mostly short-term memory recall   SENSATION: WFL Intermittent numbness and tingling in soles of B feet - most common upon getting up out of a chair  MUSCLE LENGTH: Hamstrings: mod tight L, mild tight R ITB: mod tight L, mild tight R Piriformis: mild tight L>R Hip flexors: mild tight L>R Quads: mild tight L>R Heelcord: NT  POSTURE:  No Significant postural limitations  LOWER EXTREMITY ROM:    Grossly WFL other than limitations due to tightness as above  LOWER EXTREMITY MMT:    MMT Right Eval Left Eval R 09/07/22 L 09/07/22 R 09/24/22 L 09/24/22  Hip flexion 4- 4 5 4+ 5 5  Hip extension 4- 4- 4 4+ 5- 5-  Hip abduction 4- 4- 4 4 5 5   Hip adduction 4- 4- 4+ 4+ 5 5  Hip internal rotation 3+ 4- 4+ 4 5 5   Hip external rotation 3+ 3+ 4+ 4+ 4+ 4+  Knee flexion 4+ 4 5 4+ 5 5  Knee extension 4+ 4+ 5 5 5 5   Ankle dorsiflexion 4+ 4+ 5 5 5 5   Ankle plantarflexion   5 5 5 5   Ankle inversion        Ankle eversion        (Blank rows = not tested)  BED MOBILITY:  Sit to supine Modified independence Supine to sit Modified independence Rolling to Right SBA  TRANSFERS: Assistive device utilized: None  Sit to stand: Complete Independence Stand to sit: Complete Independence Chair to chair: Complete Independence Floor:  NT  GAIT: Distance walked: 80 ft Assistive device utilized: None Level of assistance: Complete Independence Gait pattern: step through pattern, increased lateral sway Comments: Gait speed = 4.22 ft/sec  RAMP: Level of Assistance:  NT Assistive device utilized:  NT Ramp Comments:   CURB:  Level of Assistance:  NT Assistive device utilized:  NT Curb Comments:   STAIRS: (08/11/22)  Level of Assistance: Modified independence Stair Negotiation Technique: Alternating Pattern  Forwards with Single Rail on Left Number of Stairs: 14  Height of Stairs: 7"  Comments: Good  reciprocal pattern but definite need for railing for balance/support  FUNCTIONAL TESTS:  5 times sit to stand: 16.54 sec; 08/20/22 - 15 sec Timed up and go (TUG): 7.81 sec  10 meter walk test: 7.78 sec Berg Balance Scale: (08/11/22) 51/56, 46-51 moderate risk for falls (>50%)  Functional gait assessment: (08/11/22) 24/30, 19-24 = medium risk fall   09/07/22: Sharlene Motts: 54/56  09/24/22: FGA = 28/30  PATIENT SURVEYS:  ABC scale 1190 / 1600 = 74.4 %  09/24/22: ABC Scale: 1450 / 1600 = 90.6 %  TODAY'S TREATMENT:   09/24/22 THERAPEUTIC EXERCISE: to improve flexibility, strength and mobility.  Demonstration, verbal and tactile cues throughout for technique.  NuStep - L5 x 6 min  THERAPEUTIC ACTIVITIES: ABC Scale: 1450 / 1600 = 90.6 % FGA = 28/30 Goal assessment   09/21/22 THERAPEUTIC EXERCISE: to improve flexibility, strength and mobility.  Demonstration, verbal and tactile cues throughout for technique. Nustep L5x65min Lat pull + march GTB x 10  Shld ext with modified SLS GTB 2x10 Sideways OTIS with modified SLS x 10 bil Slider lunge 2x10 fwd and lateral TRX squat 2x10 TRX row x 12   09/17/22 THERAPEUTIC EXERCISE: to improve flexibility, strength and mobility.  Demonstration, verbal and tactile cues throughout for technique. FGA: 27/30 Golf swings blue TB each direction x 20  Supine hip ER/IR x 10 bil Supine bridge with step out x 10 bil Supine bridge with march x 10    PATIENT EDUCATION:  Education details: HEP review and recommended frequency for ongoing HEP at discharge to prevent loss of gains achieved with PT  Person educated: Patient Education method: Explanation Education comprehension: verbalized understanding  HOME EXERCISE PROGRAM: Access Code: [email or web address] URL: [email or web address] Date: 09/17/2022 Prepared by: Verta Ellen  Exercises - Seated Hamstring Stretch  - 1 x daily - 7 x weekly - 3 sets - 30 sec hold - Supine Quadriceps Stretch with Strap  on Table  - 1 x daily - 7 x weekly - 3 sets - 30 sec hold - Supine Bridge  - 1 x daily - 3 x weekly - 3 sets - 10 reps - Squat with Counter Support  - 1 x daily - 3 x weekly - 3 sets - 10 reps - Split Stance Shoulder Row with Resistance  - 1 x daily - 3 x weekly - 2 sets - 10 reps - 5 sec hold - Scapular Retraction with Resistance Advanced  - 1 x daily - 3 x weekly - 2 sets - 10 reps - 5 sec hold - Staggered Lat Pull Down with Resistance - Elbows Bent  - 1 x daily - 3 x weekly - 2 sets - 10 reps - 5 sec hold - Standing 3-Way Leg Reach with Resistance at Ankles and Counter Support  - 1 x daily - 3 x weekly - 3 sets - 10 reps - Forward T with Counter Support  - 1 x daily - 3 x weekly - 3 sets - 10 reps - Corner Balance Feet Together With Eyes Open  - 1 x daily - 3 x weekly - 3 reps - 30 sec hold - Corner Balance Feet Together: Eyes Open With Head Turns  - 1 x daily - 3 x weekly - 2 sets - 5 reps - Corner Balance Feet Together With Eyes Closed  - 1 x daily - 3 x weekly - 2 sets - 10 reps - 3 sec hold - Corner Balance Feet Together: Eyes Closed With Head Turns  - 1 x daily - 3 x weekly - 2 sets - 10 reps - 3 sec hold - Standing Diagonal Chop  - 1 x daily - 3 x weekly - 2  sets - 10 reps - 3 sec hold - Seated Plantar Fascia Stretch  - 2 x daily - 7 x weekly - 3 reps - 30 sec hold - Long Sitting Plantar Fascia Stretch with Towel  - 2-3 x daily - 7 x weekly - 3 reps - 30 sec hold - Plantar Fascia Stretch on Step  - 2-3 x daily - 7 x weekly - 3 reps - 30 sec hold - Gastroc Stretch with Foot at Wall  - 2-3 x daily - 7 x weekly - 3 reps - 30 sec hold - Seated Plantar Fascia Mobilization with Small Ball  - 2-3 x daily - 7 x weekly - 2-3 min hold - Foot Roller Plantar Massage  - 2-3 x daily - 7 x weekly - 2-3 min hold - Single Leg Stance Stabilization with Slow Front to Back Band Oscillations - Anterior Anchor  - 1 x daily - 3 x weekly - 2 sets - 10 reps - 3 sec hold - Single Leg Stance Stabilization with  Slow Side to Side Band Oscillations - Lateral Anchor  - 1 x daily - 3 x weekly - 2 sets - 10 reps - 3 sec hold - Single Leg Stance Stabilization with Slow Side to Side Band Oscillations - Medial Anchor  - 1 x daily - 3 x weekly - 2 sets - 10 reps - 3 sec hold - Single Leg Stance Stabilization with Slow Front to Back Band Oscillations - Posterior Anchor  - 1 x daily - 3 x weekly - 2 sets - 10 reps - 3 sec hold - Side Stepping with Resistance at Feet  - 1 x daily - 3 x weekly - 2 sets - 10 reps - Band Walks  - 1 x daily - 3 x weekly - 2 sets - 10 reps - Seated Flexion Stretch with Swiss Ball  - 1 x daily - 7 x weekly - 3 sets - 3 reps - 15 sec hold - Supine Hip Internal and External Rotation  - 1 x daily - 7 x weekly - 2 sets - 10 reps - 5 sec hold  *MedBridgeGO access code email/texted to patient   ASSESSMENT:  CLINICAL IMPRESSION: Noah Lane has demonstrated excellent progress with PT with gains noted in B LE strength, balance and activity tolerance.  All standardized balance testing indicating a low to no fall risk, and balance confidence per ABC scale now >90%.  All PT goals now met and Scout feels ready to transition to his HEP, therefore will proceed with discharge from physical therapy for this episode.  OBJECTIVE IMPAIRMENTS: decreased activity tolerance, decreased balance, decreased knowledge of condition, decreased mobility, difficulty walking, decreased strength, decreased safety awareness, impaired perceived functional ability, impaired flexibility, and impaired sensation.   ACTIVITY LIMITATIONS: carrying, lifting, bending, standing, squatting, stairs, transfers, bed mobility, locomotion level, and caring for others  PARTICIPATION LIMITATIONS: meal prep, cleaning, laundry, community activity, and occupation  PERSONAL FACTORS: Fitness, Past/current experiences, Time since onset of injury/illness/exacerbation, and 3+ comorbidities: DM-II, elevated BP, GERD, OSA, chronic LBP, h/o R RTC  tendinitis, B patellofemoral pain syndrome, B hip pain  are also affecting patient's functional outcome.   REHAB POTENTIAL: Good  CLINICAL DECISION MAKING: Stable/uncomplicated  EVALUATION COMPLEXITY: Low   GOALS: Goals reviewed with patient? Yes  SHORT TERM GOALS: Target date: 08/31/2022   Patient will be independent with initial HEP to improve outcomes and carryover.  Baseline:  Goal status: MET  08/13/22  2.  Patient will be  educated on strategies to decrease risk of falls.  Baseline:  Goal status: MET  08/13/22  3.  Patient will demonstrate decreased 5xSTS time to </= 12 sec to decrease risk for falls with transitional mobility. Baseline: 16.54 sec, 15 sec (08/20/22) Goal status: MET  09/07/22 - 10.56 secs  LONG TERM GOALS: Target date: 09/28/2022   Patient will be independent with advanced/ongoing HEP to facilitate ability to maintain/progress functional gains from skilled physical therapy services. Baseline:  Goal status: MET  09/24/22  2.  Patient will demonstrate improved B LE strength to >/= 4 to 4+/5 for improved stability and ease of mobility . Baseline: Refer to above MMT table Goal status: MET  09/07/22  3.  Patient will be report ability to ambulate for >/= 30 min w/o AD on variable surfaces with good safety to access community.  Baseline:  Goal status: MET  09/07/22 - pt able to walk 1.3 miles in 40 minutes  4.  Patient will be able to step up/down curb safely w/o AD for safety with community ambulation.  Baseline:  Goal status: MET  09/07/22   5.  Patient will report ability to navigate stairs to second floor of home without limitation due to fatigue by end of day. Baseline:  Goal status: MET  09/07/22  6.  Patient will improve Berg score to >/= 54/56 to improve safety and stability with ADLs in standing and reduce risk for falls.  Baseline: 51/56 Goal status: MET  09/07/22 - 54/56  7. Patient will improve FGA score to at least 28/30 to improve gait stability and  reduce risk for falls. Baseline: 24/30 Goal status: MET  09/17/22 - 27/30; 09/23/22 - 28/30  10.  Patient will report >/= 90% balance confidence on ABC scale to demonstrate improved functional ability. Baseline: 1190 / 1600 = 74.4 % Goal status: MET  09/07/22 - 1160 / 1600 = 72.5 %; 09/24/22 - 1450 / 1600 = 90.6 %   PLAN:  PT FREQUENCY: 2x/week  PT DURATION: 6-8 weeks  PLANNED INTERVENTIONS: Therapeutic exercises, Therapeutic activity, Neuromuscular re-education, Balance training, Gait training, Patient/Family education, Self Care, Joint mobilization, Stair training, DME instructions, Aquatic Therapy, Dry Needling, Electrical stimulation, Cryotherapy, Moist heat, Taping, Ultrasound, Manual therapy, and Re-evaluation  PLAN FOR NEXT SESSION: transition to HEP + D/C from PT   PHYSICAL THERAPY DISCHARGE SUMMARY  Visits from Start of Care: 15  Current functional level related to goals / functional outcomes: Refer to above clinical impression and goal assessment.   Remaining deficits: None.   Education / Equipment: HEP   Patient agrees to discharge. Patient goals were met. Patient is being discharged due to meeting the stated rehab goals.   Marry Guan, PT 09/24/2022, 12:25 PM

## 2022-09-28 ENCOUNTER — Encounter: Payer: Medicare Other | Admitting: Physical Therapy

## 2022-10-05 ENCOUNTER — Encounter: Payer: Self-pay | Admitting: Family Medicine

## 2022-10-05 ENCOUNTER — Ambulatory Visit (INDEPENDENT_AMBULATORY_CARE_PROVIDER_SITE_OTHER): Payer: Medicare Other | Admitting: Behavioral Health

## 2022-10-05 ENCOUNTER — Other Ambulatory Visit: Payer: Self-pay | Admitting: Family Medicine

## 2022-10-05 DIAGNOSIS — F4323 Adjustment disorder with mixed anxiety and depressed mood: Secondary | ICD-10-CM | POA: Diagnosis not present

## 2022-10-05 DIAGNOSIS — H9190 Unspecified hearing loss, unspecified ear: Secondary | ICD-10-CM

## 2022-10-05 NOTE — Progress Notes (Signed)
[facility] Counselor/Therapist Progress Note  Patient ID: Noah Lane, MRN: [id number],    Date: 10/05/2022  Time Spent: 55 min In Person @ [facility] Office Time In: 10:00am Time Out: 10:55am   Treatment Type: Individual Therapy  Reported Symptoms: Inc in anger issues since his walk on Sun & venting to his Wife Noah Lane in an explosive & unhelpful way.  Mental Status Exam: Appearance:  Casual and Neat     Behavior: Appropriate and Sharing  Motor: Normal  Speech/Language:  Clear and Coherent  Affect: Appropriate  Mood: normal  Thought process: normal  Thought content:   WNL  Sensory/Perceptual disturbances:   WNL  Orientation: oriented to person, place, time/date, and situation  Attention: Good  Concentration: Good  Memory: WNL  Fund of knowledge:  Good  Insight:   Good  Judgment:  Good  Impulse Control: Good   Risk Assessment: Danger to Self:  No Self-injurious Behavior: No Danger to Others: No Duty to Warn:no Physical Aggression / Violence:No  Access to Firearms a concern: No  Gang Involvement:No   Subjective: Pt is describing his anger today & how his exp during a walk on Sun caused upset for his Wife & himself. Explored the origins of his anger rxn & the content/context of what he finds, "difficult to let go".    Interventions: Solution-Oriented/Positive Psychology, Ego-Supportive, and Insight-Oriented  Diagnosis:Adjustment reaction with anxiety and depression  Plan: Noah Lane has started to Journal in his Notebook & tries to do it daily @ 7:30am. His morning routine is inflexible & he felt this contributed to his angry words towards his Wife on Sun. He is concerned for Sx of ADHD & agrees to complete a Screener next visit to direct his worries about this in session. He will cont to record in his Notebook. Target Date: 10/14/2022  Progress: 6  Frequency: Once every 2-3 wks  Modality: Claretta Fraise, LMFT

## 2022-10-05 NOTE — Progress Notes (Signed)
                 L , LMFT 

## 2022-10-18 ENCOUNTER — Ambulatory Visit (INDEPENDENT_AMBULATORY_CARE_PROVIDER_SITE_OTHER): Payer: Medicare Other | Admitting: Behavioral Health

## 2022-10-18 DIAGNOSIS — F4323 Adjustment disorder with mixed anxiety and depressed mood: Secondary | ICD-10-CM | POA: Diagnosis not present

## 2022-10-18 NOTE — Progress Notes (Signed)
                Victoria L Winstead, LMFT 

## 2022-10-18 NOTE — Progress Notes (Signed)
[facility] Counselor/Therapist Progress Note  Patient ID: Noah Lane, MRN: [id number],    Date: 10/18/2022  Time Spent: 55 min In Person @ [facility] Office Time In: 9:00am Time Out: 9:55am   Treatment Type: Individual Therapy  Reported Symptoms: Cont'd anx/dep & some stress due to life circumstances, health status changes in the past year, & adjustment to Wife's cont'g health status changes  Mental Status Exam: Appearance:  Casual     Behavior: Appropriate and Sharing  Motor: Normal  Speech/Language:  Clear and Coherent  Affect: Appropriate  Mood: normal  Thought process: normal  Thought content:   WNL  Sensory/Perceptual disturbances:   WNL  Orientation: oriented to person, place, time/date, and situation  Attention: Good  Concentration: Good  Memory: WNL  Fund of knowledge:  Good  Insight:   Good  Judgment:  Good  Impulse Control: Good   Risk Assessment: Danger to Self:  No Self-injurious Behavior: No Danger to Others: No Duty to Warn:no Physical Aggression / Violence:No  Access to Firearms a concern: No  Gang Involvement:No   Subjective: Pt is upbeat today & curious to take the ASRS-v1.1 to screen for ADHD. He describes various concerns for boredom, procrastination, completion of tasks, & need for novelty. Screener results:  Part A: 4/6 Part B: 8/12   Pt recalls his Parents being told he "talks too much" in 1st or 3rd Grade. Otherwise, no specific reporting of disruptive beh as a young boy. He does currently report his attn is not easily kept by boring materials, difficulty concentrating, talking a lot, interrupting others, avoidant of tasks, & feeling driven like a motor.   Pt reports his neg thinking comes up during his morning walks & he is re-directing/revising & stopping these thoughts. 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 He is staying Px'ly active & doing many tasks in the home.  Interventions:  Solution-Oriented/Positive Psychology, Psycho-education/Bibliotherapy, and Insight-Oriented  Diagnosis:Adjustment reaction with anxiety and depression  Plan: Emmauel c/o a Family Hx in which his Fr was very stubborn & he does not want to have this happen to him as he ages. He is instructed to stay curious, keep an open mind & use the resource provided to read about psychological flexibility @ [email or web address] He will explore compensatory mechanisms to change his beh.  Target Date: 11/14/2022  Progress: 6  Frequency: Once every 2-3 wks  Modality: Claretta Fraise, LMFT

## 2022-10-21 ENCOUNTER — Ambulatory Visit: Payer: Medicare Other | Attending: Family Medicine | Admitting: Audiology

## 2022-10-21 DIAGNOSIS — H903 Sensorineural hearing loss, bilateral: Secondary | ICD-10-CM | POA: Insufficient documentation

## 2022-10-21 NOTE — Procedures (Signed)
  Outpatient Audiology and [facility] [address] [place], [place]  [address] [phone]  AUDIOLOGICAL  EVALUATION  NAME: Noah Lane     DOB:   June 10, 1955      MRN: [id number]                                                                                     DATE: 10/21/2022     REFERENT: Sharlene Dory, DO STATUS: Outpatient DIAGNOSIS: sensorineural hearing loss, bilateral    History: Noah Lane was seen for an audiological evaluation due to decreased hearing occurring for a few years.  He reports difficulty hearing in his house from different rooms.  Noah Lane reports he has been told he is a loud talker.  Noah Lane denies otalgia or aural fullness and tinnitus today.  He reports a history of dizziness which could be related to medicine and his medical history.  Noah Lane also reports a history of vertigo episodes many years ago with no recent episodes.  Evaluation:  Otoscopy showed a clear view of the tympanic membranes, bilaterally Tympanometry results were consistent with normal tympanic membrane mobility and normal middle ear pressure in both ears. Audiometric testing was completed using Conventional Audiometry techniques with insert earphones and TDH headphones. Test results are consistent in the right ear with a mild sloping to moderately severe sensorineural hearing loss.  Test results are consistent in the left ear with normal hearing sensitivity sloping to a mild to moderately severe sensorineural hearing loss.  There is a sensorineural asymmetry noted at 250 Hz to 8000 Hz which is worse in the right ear.  There is also a sensorineural asymmetry noted at 4000 Hz which is worse in the left ear.  Speech Recognition Thresholds were obtained at 35 dB HL in the right ear and at 20  dB HL in the left ear. Word Recognition Testing was completed at 70 dB HL and Noah Lane scored 96% in the right ear and 100% in the left ear.      Results:  The test results were reviewed  with Noah Lane. Test results are consistent in the right ear with a mild sloping to moderately severe sensorineural hearing loss.  Test results are consistent in the left ear with normal hearing sensitivity sloping to a mild to moderately severe sensorineural hearing loss.  There is a sensorineural asymmetry noted at 250 Hz to 8000 Hz which is worse in the right ear.  There is also a sensorineural asymmetry noted at 4000 Hz which is worse in the left ear. Noah Lane will have hearing and communication difficulty in many listening environments. He will benefit from the use of amplification if motivated. A referral to ENT was reviewed due to asymmetry.   Recommendations: 1.   Referral to ENT due to sensorineural asymmetry 2.   Communication Needs Assessment with an Audiologist if motivated to pursue amplification   45 minutes spent testing and counseling on results.   If you have any questions please feel free to contact me at [phone].  Marton Redwood Audiologist, Au.D., CCC-A 10/21/2022  9:32 AM  Cc: Sharlene Dory, DO

## 2022-10-29 ENCOUNTER — Other Ambulatory Visit: Payer: Self-pay | Admitting: Family Medicine

## 2022-10-29 ENCOUNTER — Encounter: Payer: Self-pay | Admitting: Family Medicine

## 2022-10-29 DIAGNOSIS — H9190 Unspecified hearing loss, unspecified ear: Secondary | ICD-10-CM

## 2022-11-03 ENCOUNTER — Ambulatory Visit (INDEPENDENT_AMBULATORY_CARE_PROVIDER_SITE_OTHER): Payer: Medicare Other | Admitting: Behavioral Health

## 2022-11-03 DIAGNOSIS — F4323 Adjustment disorder with mixed anxiety and depressed mood: Secondary | ICD-10-CM | POA: Diagnosis not present

## 2022-11-03 NOTE — Progress Notes (Signed)
[facility] Counselor/Therapist Progress Note  Patient ID: Noah Lane, MRN: [id number],    Date: 11/03/2022  Time Spent: Caregility video; Pt is home in his Office in private & Provider is working remote from [employer]. Pt is aware of the risks/limitations of telehealth & consents to Tx today.  Time In: 9:00am Time Out: 9:55am   Treatment Type: Individual Therapy  Reported Symptoms: Inc'd anx/dep & stress due to work issues & World influence w/events happening.   Mental Status Exam: Appearance:  Casual     Behavior: Appropriate and Sharing  Motor: Normal  Speech/Language:  Clear and Coherent  Affect: Appropriate  Mood: normal  Thought process: normal  Thought content:   WNL  Sensory/Perceptual disturbances:   WNL  Orientation: oriented to person, place, time/date, and situation  Attention: Good  Concentration: Good  Memory: WNL  Fund of knowledge:  Good  Insight:   Good  Judgment:  Good  Impulse Control: Good   Risk Assessment: Danger to Self:  No Self-injurious Behavior: No Danger to Others: No Duty to Warn:no Physical Aggression / Violence:No  Access to Firearms a concern: No  Gang Involvement:No   Subjective: Pt is questioning the World events happening & how to voice his own concerns in a culture of uncertain receptivity.    Interventions: Solution-Oriented/Positive Psychology and Interpersonal  Diagnosis:Adjustment reaction with anxiety and depression  Plan: Pt is trying to think in terms of an Interfaith perspective for the mtg this Friday. He may want to consider another perspective for the audience; all inclusive & respectful to all belief systems. He will explore & research this prior to his talk on Friday & report back next session.  Target Date: 11/29/2022  Progress: 4  Frequency: Once every 2-3 wks  Modality: Claretta Fraise, LMFT

## 2022-11-03 NOTE — Progress Notes (Signed)
                Victoria L Winstead, LMFT 

## 2022-11-09 ENCOUNTER — Other Ambulatory Visit: Payer: Self-pay | Admitting: Family Medicine

## 2022-11-09 DIAGNOSIS — E1169 Type 2 diabetes mellitus with other specified complication: Secondary | ICD-10-CM

## 2022-11-20 ENCOUNTER — Other Ambulatory Visit: Payer: Self-pay | Admitting: Family Medicine

## 2022-12-01 ENCOUNTER — Ambulatory Visit: Payer: Medicare Other | Admitting: Behavioral Health

## 2022-12-01 DIAGNOSIS — F4323 Adjustment disorder with mixed anxiety and depressed mood: Secondary | ICD-10-CM | POA: Diagnosis not present

## 2022-12-01 NOTE — Progress Notes (Signed)
                Noah Lane Farryn Linares, LMFT 
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
## 2022-12-21 ENCOUNTER — Ambulatory Visit (INDEPENDENT_AMBULATORY_CARE_PROVIDER_SITE_OTHER): Payer: Medicare Other | Admitting: Behavioral Health

## 2022-12-21 DIAGNOSIS — F4323 Adjustment disorder with mixed anxiety and depressed mood: Secondary | ICD-10-CM

## 2022-12-21 NOTE — Progress Notes (Signed)
                Anastasya Jewell L Farryn Linares, LMFT 

## 2022-12-21 NOTE — Progress Notes (Signed)
[facility] Counselor/Therapist Progress Note  Patient ID: Desjuan Crosslin, MRN: [id number],    Date: 12/21/2022  Time Spent: 55 min In Person @ [facility] - [facility] Office Time In: 10:00am Time Out: 10:55am   Treatment Type: Individual Therapy  Reported Symptoms: Pt has elevated anx/dep & stress due to the changes health status issues have caused. Pt is upset w/self he has let his health take a backseat.  Mental Status Exam: Appearance:  Casual     Behavior: Appropriate and Sharing  Motor: Restlestness due to DM Neuropathy issues  Speech/Language:  Clear and Coherent and Normal Rate  Affect: Appropriate  Mood: normal  Thought process: normal  Thought content:   WNL  Sensory/Perceptual disturbances:   WNL  Orientation: oriented to person, place, time/date, and situation  Attention: Good  Concentration: Good  Memory: WNL  Fund of knowledge:  Good  Insight:   Good  Judgment:  Good  Impulse Control: Good   Risk Assessment: Danger to Self:  No Self-injurious Behavior: No Danger to Others: No Duty to Warn:no Physical Aggression / Violence:No  Access to Firearms a concern: No  Gang Involvement:No   Subjective: Kao is upbeat today & ready/motivated to do more for his health. He & Wife are caring for ea other & homelife is improved. Pt feels some lack of focus & inc'd distraction by his work [employer], but he plans to get his workout equipment into his Home Office by this wknd.   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 He wants to ensure the financial health of his relationship in retirement.  Interventions: Family Systems  Diagnosis:Adjustment reaction with anxiety and depression  Plan: Bille will try to manage his goals & also be realistic if his knees get numb & he needs to rest. He will set reasonable deadlines, but also give himself grace if things do not go exactly as planned. Kwan will try to do extra activities w/his Wife [name] on Sundays so they can  enjoy fun time tgthr.  Target Date: 01/29/2023  Progress: 7  Frequency: Once every 3 wks  Modality: Claretta Fraise, LMFT

## 2023-01-05 ENCOUNTER — Ambulatory Visit: Payer: Medicare Other | Admitting: Behavioral Health

## 2023-01-05 DIAGNOSIS — F4323 Adjustment disorder with mixed anxiety and depressed mood: Secondary | ICD-10-CM | POA: Diagnosis not present

## 2023-01-05 NOTE — Progress Notes (Signed)
                Anastasya Jewell L Farryn Linares, LMFT 
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
## 2023-01-11 ENCOUNTER — Ambulatory Visit: Payer: Medicare Other | Admitting: Behavioral Health

## 2023-02-02 ENCOUNTER — Ambulatory Visit: Payer: Medicare Other | Admitting: Behavioral Health

## 2023-02-10 ENCOUNTER — Encounter (INDEPENDENT_AMBULATORY_CARE_PROVIDER_SITE_OTHER): Payer: Self-pay

## 2023-02-10 ENCOUNTER — Other Ambulatory Visit: Payer: Self-pay | Admitting: Medical Genetics

## 2023-02-16 ENCOUNTER — Other Ambulatory Visit: Payer: Self-pay | Admitting: Family Medicine

## 2023-02-17 ENCOUNTER — Encounter: Payer: Self-pay | Admitting: Family Medicine

## 2023-03-09 ENCOUNTER — Ambulatory Visit (HOSPITAL_BASED_OUTPATIENT_CLINIC_OR_DEPARTMENT_OTHER): Admission: RE | Admit: 2023-03-09 | Payer: Medicare Other | Source: Ambulatory Visit

## 2023-03-09 ENCOUNTER — Ambulatory Visit (HOSPITAL_BASED_OUTPATIENT_CLINIC_OR_DEPARTMENT_OTHER)
Admission: RE | Admit: 2023-03-09 | Discharge: 2023-03-09 | Disposition: A | Discharge status: Home / Self Care | Payer: Medicare Other | Source: Ambulatory Visit | Attending: Hematology & Oncology | Admitting: Hematology & Oncology

## 2023-03-09 DIAGNOSIS — C189 Malignant neoplasm of colon, unspecified: Secondary | ICD-10-CM | POA: Insufficient documentation

## 2023-03-09 LAB — POCT I-STAT CREATININE: Creatinine, Ser: 1.5 mg/dL — ABNORMAL HIGH (ref 0.61–1.24)

## 2023-03-09 MED ORDER — IOHEXOL 300 MG/ML  SOLN
125.0000 mL | Freq: Once | INTRAMUSCULAR | Status: AC | PRN
  Administered 2023-03-09: 125 mL via INTRAVENOUS

## 2023-03-10 ENCOUNTER — Telehealth: Payer: Self-pay

## 2023-03-10 ENCOUNTER — Encounter: Payer: Self-pay | Admitting: Family Medicine

## 2023-03-10 ENCOUNTER — Other Ambulatory Visit (HOSPITAL_COMMUNITY)
Admission: RE | Admit: 2023-03-10 | Discharge: 2023-03-10 | Disposition: A | Discharge status: Home / Self Care | Payer: Self-pay | Source: Ambulatory Visit | Attending: Medical Genetics | Admitting: Medical Genetics

## 2023-03-10 NOTE — Telephone Encounter (Signed)
-----   Message from Josph Macho sent at 03/10/2023  2:10 PM EST ----- Please call and let him know that the CT scan does not show any obvious recurrent colon cancer.  Thanks.  Cindee Lame

## 2023-03-10 NOTE — Telephone Encounter (Signed)
 Advised via MyChart.

## 2023-03-21 LAB — GENECONNECT MOLECULAR SCREEN: Genetic Analysis Overall Interpretation: NEGATIVE

## 2023-03-29 ENCOUNTER — Telehealth: Payer: Self-pay

## 2023-03-29 DIAGNOSIS — E669 Obesity, unspecified: Secondary | ICD-10-CM

## 2023-03-29 NOTE — Telephone Encounter (Signed)
Gave pt a call regarding PAP Novo Nordisk (Ozempic),pt answer and gave permission to fill out his part of application online,we also send provider portion to be fill out today.will follow up.

## 2023-03-31 NOTE — Telephone Encounter (Signed)
Copied from CRM [id number]. Topic: General - Other >> Mar 31, 2023  3:52 PM Alcus Dad H wrote: Reason for CRM: Casandra with [employer] would like a call back from who helps with patient assistance. Says they are missing the provider's portion of the paperwork, pages 7,8, and 9. Call back number is [phone]

## 2023-04-03 ENCOUNTER — Emergency Department (HOSPITAL_BASED_OUTPATIENT_CLINIC_OR_DEPARTMENT_OTHER)
Admission: EM | Admit: 2023-04-03 | Discharge: 2023-04-03 | Disposition: A | Discharge status: Home / Self Care | Payer: Medicare Other | Attending: Emergency Medicine | Admitting: Emergency Medicine

## 2023-04-03 ENCOUNTER — Other Ambulatory Visit: Payer: Self-pay

## 2023-04-03 ENCOUNTER — Emergency Department (HOSPITAL_BASED_OUTPATIENT_CLINIC_OR_DEPARTMENT_OTHER): Payer: Medicare Other

## 2023-04-03 ENCOUNTER — Encounter (HOSPITAL_BASED_OUTPATIENT_CLINIC_OR_DEPARTMENT_OTHER): Payer: Self-pay

## 2023-04-03 DIAGNOSIS — E119 Type 2 diabetes mellitus without complications: Secondary | ICD-10-CM | POA: Diagnosis not present

## 2023-04-03 DIAGNOSIS — Z794 Long term (current) use of insulin: Secondary | ICD-10-CM | POA: Insufficient documentation

## 2023-04-03 DIAGNOSIS — S92511A Displaced fracture of proximal phalanx of right lesser toe(s), initial encounter for closed fracture: Secondary | ICD-10-CM | POA: Diagnosis not present

## 2023-04-03 DIAGNOSIS — Z7984 Long term (current) use of oral hypoglycemic drugs: Secondary | ICD-10-CM | POA: Diagnosis not present

## 2023-04-03 DIAGNOSIS — L03115 Cellulitis of right lower limb: Secondary | ICD-10-CM | POA: Insufficient documentation

## 2023-04-03 DIAGNOSIS — X58XXXA Exposure to other specified factors, initial encounter: Secondary | ICD-10-CM | POA: Insufficient documentation

## 2023-04-03 DIAGNOSIS — M79671 Pain in right foot: Secondary | ICD-10-CM | POA: Diagnosis present

## 2023-04-03 MED ORDER — CEPHALEXIN 500 MG PO CAPS: 500.0000 mg | ORAL_CAPSULE | Freq: Four times a day (QID) | ORAL | 0 refills | Status: DC

## 2023-04-03 MED ORDER — CEPHALEXIN 250 MG PO CAPS
500.0000 mg | ORAL_CAPSULE | Freq: Once | ORAL | Status: AC
  Administered 2023-04-03: 500 mg via ORAL
  Filled 2023-04-03: qty 2

## 2023-04-03 MED ORDER — DOXYCYCLINE HYCLATE 100 MG PO TABS
100.0000 mg | ORAL_TABLET | Freq: Once | ORAL | Status: AC
  Administered 2023-04-03: 100 mg via ORAL
  Filled 2023-04-03: qty 1

## 2023-04-03 MED ORDER — DOXYCYCLINE HYCLATE 100 MG PO CAPS: 100.0000 mg | ORAL_CAPSULE | Freq: Two times a day (BID) | ORAL | 0 refills | Status: DC

## 2023-04-03 NOTE — ED Triage Notes (Addendum)
Patient arrives POV with complaints of toe swelling (pinky toe) on his right foot x3 day. Reports no known injuries or lacerations to foot.  Patient rates his pain 6/10.  Patient does states that he is diabetic.

## 2023-04-03 NOTE — ED Provider Notes (Signed)
[facility] Provider Note   CSN: [id number] Arrival date & time: 04/03/23  8469     History  Chief Complaint  Patient presents with   Foot Problem    Right     Noah Lane is a 68 y.o. male, history of diabetes, who presents to the ED secondary to right fifth toe swelling, and pain has been going on for the last 2 days.  He states on Friday, he noticed that his right pinky toe was swollen, and red.  Has progressively gotten worse, and went to get it checked out.  States pain is 6 out of 10, and he has tried soaking it in Epsom salt, and using ice without relief.  Denies any recent cuts, or stepping on anything out of the ordinary.  Wear supportive shoes that are not too tight per patient.  Denies any fevers, chills, nausea, and vomiting.  Home Medications Prior to Admission medications   Medication Sig Start Date End Date Taking? Authorizing Provider  cephALEXin (KEFLEX) 500 MG capsule Take 1 capsule (500 mg total) by mouth 4 (four) times daily. 04/03/23  Yes Drue Camera L, PA  doxycycline (VIBRAMYCIN) 100 MG capsule Take 1 capsule (100 mg total) by mouth 2 (two) times daily. 04/03/23  Yes Pranshu Lyster L, PA  ALPHA-LIPOIC ACID PO Take 500 mg by mouth daily.    [provider]  atorvastatin (LIPITOR) 20 MG tablet Take 1 tablet (20 mg total) by mouth daily. 02/23/22   Sharlene Dory, DO  cyanocobalamin (VITAMIN B12) 1000 MCG tablet Take 1,000 mcg by mouth daily.    [provider]  feeding supplement (ENSURE ENLIVE / ENSURE PLUS) LIQD Take 237 mLs by mouth 3 (three) times daily with meals. Patient not taking: Reported on 09/23/2022 05/04/22   Rodolph Bong, MD  Garlic 500 MG CAPS Take 1 capsule by mouth daily.    [provider]  Chilton Si Tea, Camellia sinensis, (GREEN TEA EXTRACT PO) Take 500 mg by mouth 2 (two) times daily.    [provider]  meloxicam (MOBIC) 15 MG tablet Take 1 tablet (15 mg  total) by mouth daily. Patient not taking: Reported on 09/23/2022 09/09/22   Hyman Hopes B, NP  metFORMIN (GLUCOPHAGE-XR) 500 MG 24 hr tablet TAKE 1 TABLET BY MOUTH DAILY WITH BREAKFAST 02/16/23   Sharlene Dory, DO  nitroGLYCERIN (NITRODUR - DOSED IN MG/24 HR) 0.2 mg/hr patch Cut and apply 1/4 patch to most painful area q24h. Patient not taking: Reported on 09/23/2022 06/17/21   Myra Rude, MD  olmesartan (BENICAR) 20 MG tablet TAKE 1 TABLET BY MOUTH DAILY 02/16/23   Wendling, Jilda Roche, DO  OZEMPIC, 0.25 OR 0.5 MG/DOSE, 2 MG/3ML SOPN DIAL AND INJECT UNDER THE SKIN 0.25 MG WEEKLY FOR 4 WEEKS THEN DIAL AND INJECT 0.5 MG WEEKLY THEREAFTER 11/09/22   Wendling, Jilda Roche, DO  polycarbophil (FIBERCON) 625 MG tablet Take 1 tablet (625 mg total) by mouth 2 (two) times daily. 05/04/22   Rodolph Bong, MD  PRESCRIPTION MEDICATION Inject 4 mLs as directed as needed (erectile disfunction). prostaglandin    [provider]      Allergies    Patient has no known allergies.    Review of Systems   Review of Systems  Skin:  Positive for rash. Negative for wound.    Physical Exam Updated Vital Signs BP (!) 146/87 (BP Location: Right Arm)   Pulse (!) 54   Temp  98.3 F (36.8 C) (Oral) Comment: Simultaneous filing. User may not have seen previous data. Comment (Src): Simultaneous filing. User may not have seen previous data.  Resp 17   Ht 6\' 1"  (1.854 m)   Wt 113.9 kg   SpO2 100%   BMI 33.13 kg/m  Physical Exam Vitals and nursing note reviewed.  Constitutional:      General: He is not in acute distress.    Appearance: He is well-developed.  HENT:     Head: Normocephalic and atraumatic.  Eyes:     General:        Right eye: No discharge.        Left eye: No discharge.     Conjunctiva/sclera: Conjunctivae normal.  Pulmonary:     Effort: No respiratory distress.  Musculoskeletal:     Comments: Tenderness to palpation, fifth digit, with mild edema.  No midfoot  tenderness.  Positive radial pulse.  Able to extend and flex, all digits as well as abduct and abduct toes  Skin:    Comments: Overlying erythema and edema of the fifth digit of the right toe, with erythema streaking up the dorsal aspect of the foot about 4 cm up the foot.  With increased warmth, but no edema.  No open wounds present or skin sloughing  Neurological:     Mental Status: He is alert.     Comments: Clear speech.   Psychiatric:        Behavior: Behavior normal.        Thought Content: Thought content normal.     ED Results / Procedures / Treatments   Labs (all labs ordered are listed, but only abnormal results are displayed) Labs Reviewed - No data to display  EKG None  Radiology DG Foot Complete Right Result Date: 04/03/2023 CLINICAL DATA:  Diabetic with fifth digit swelling EXAM: RIGHT FOOT COMPLETE - 3+ VIEW COMPARISON:  03/02/2020 FINDINGS: Mild hallux valgus deformity with secondary degenerative changes of the first metatarsophalangeal joint. Nonunited fracture at the base of the fifth metatarsal. Oblique, minimally displaced fracture of the shaft of the proximal phalanx of the fifth digit, without intra-articular extension. IMPRESSION: 1. Acute, minimally displaced fracture of the shaft of the proximal phalanx of the fifth digit. 2. Nonunited fracture at the base of the fifth metatarsal. Electronically Signed   By: Jeronimo Greaves M.D.   On: 04/03/2023 10:05    Procedures Procedures    Medications Ordered in ED Medications  doxycycline (VIBRA-TABS) tablet 100 mg (100 mg Oral Given 04/03/23 1004)  cephALEXin (KEFLEX) capsule 500 mg (500 mg Oral Given 04/03/23 1004)    ED Course/ Medical Decision Making/ A&P                                 Medical Decision Making Patient is 68 year old male, here for swelling, the right pinky toe, this been going on for the last couple days.  No known injuries.  Pain is a 6 out of 10.  He does have overlying erythema, and streaking, I  am suspicious for cellulitis, we will go ahead and treat for cellulitis with Keflex, doxycycline, he has no real point tenderness, however given history of diabetes, we will obtain x-ray, for further evaluation  Amount and/or Complexity of Data Reviewed Radiology: ordered.    Details: X-ray shows fifth proximal phalanx, fracture, slightly displaced as well as nonunited fracture at the base of the fifth metatarsal Discussion of  management or test interpretation with external provider(s): It appears that his fracture, the fifth metatarsal is likely old, based off his old x-rays, but this fifth proximal phalanx, fracture is new, I placed him a flat toe shoe, and will cover him for cellulitis given the streaking erythema.  We will have him follow-up with his old orthopedist, at the sports medicine doctor, I instructed him on return precautions and discharged home with Keflex and doxycycline.  He is able to bear weight, I discussed avoiding any strenuous activity  Risk Prescription drug management.   Final Clinical Impression(s) / ED Diagnoses Final diagnoses:  Cellulitis of right foot  Closed displaced fracture of proximal phalanx of lesser toe of right foot, initial encounter    Rx / DC Orders ED Discharge Orders          Ordered    doxycycline (VIBRAMYCIN) 100 MG capsule  2 times daily        04/03/23 1023    cephALEXin (KEFLEX) 500 MG capsule  4 times daily        04/03/23 1023              Yeily Link, [place], [place] 04/03/23 1028    Rozelle Logan, DO 04/03/23 1044

## 2023-04-03 NOTE — ED Notes (Signed)
Patient given discharge instructions. Questions were answered. Patient verbalized understanding of discharge instructions and care at home.  

## 2023-04-03 NOTE — Discharge Instructions (Signed)
Please follow-up with your primary care doctor, I believe that you have an infection of the skin, also known as cellulitis.  Please take the antibiotics as prescribed, return if you feel like your symptoms are not improving, you have loss of pulse of your foot, or the redness/swelling gets worse.  You also have a broken toe.  Please uses the flat toe shoe, as instructed, and follow-up with orthopedics.  I found orthopedics that you saw before, and please follow-up with them in the next week.  You also have an old fracture, that may have not healed appropriately.  Please discuss this with them.

## 2023-04-04 NOTE — Addendum Note (Signed)
Addended by: Henrene Pastor B on: 04/04/2023 03:54 PM   Modules accepted: Orders

## 2023-04-04 NOTE — Telephone Encounter (Addendum)
Reviewed application. Corrected PCP name and address on page 7. Forwarded by fax to Med Assistance Team.

## 2023-04-06 ENCOUNTER — Ambulatory Visit: Payer: Medicare Other | Admitting: Family Medicine

## 2023-04-06 ENCOUNTER — Encounter: Payer: Self-pay | Admitting: Family Medicine

## 2023-04-06 VITALS — BP 128/78 | Ht 73.0 in | Wt 251.0 lb

## 2023-04-06 DIAGNOSIS — S92511A Displaced fracture of proximal phalanx of right lesser toe(s), initial encounter for closed fracture: Secondary | ICD-10-CM | POA: Diagnosis present

## 2023-04-06 NOTE — Progress Notes (Signed)
 CHIEF COMPLAINT: No chief complaint on file.  _____________________________________________________________ SUBJECTIVE  HPI  Pt is a 68 y.o. male here for evaluation of toe fracture  Presented to the ED 04/03/2023 for right foot redness.  His history is notable for diabetes (last hemoglobin A1c 04/22/2022 8.8, increased from 7.16 months prior) -Onset 04/01/2023, noted that his right pinky toe was swollen and red, progressively worse, 6/10 pain, therapies tried at home or ice and Epsom salt, without systemic symptoms -Exam demonstrating tenderness to palpation, edema, no midfoot tenderness, overlying fifth digit of the right toe with erythema streaking up the dorsal aspect of the foot about 4 cm with increased warmth, no edema.  Prescribed doxycycline  and Keflex .  X-ray demonstrated 5th proximal phalanx fracture that was slightly displaced, non-intraarticular. also with a nonunited fracture of the base of the fifth metatarsal, latter of which is suspected to be old (visualized foot x-ray 2022, had been given orthopedics number at discharge to schedule appointment, patient shares that he had seen orthopedics for this)  MHx includes diabetes, hgba1c not at goal Creatinine 08/26/2022 was 1.26, GFR over 60  ------------------------------------------------------------------------------------------------------ Past Medical History:  Diagnosis Date   Diabetes mellitus type 2 in obese    Mixed hyperlipidemia     Past Surgical History:  Procedure Laterality Date   COLON RESECTION N/A 04/27/2022   Procedure: LAPAROSCOPIC HAND ASSISTED RIGHT HEMI COLECTOMY, POSSIBLE OPEN;  Surgeon: Dasie Leonor CROME, MD;  Location: [facility];  Service: General;  Laterality: N/A;   COLONOSCOPY     HERNIA REPAIR  2012      Outpatient Encounter Medications as of 04/06/2023  Medication Sig Note   ALPHA-LIPOIC ACID PO Take 500 mg by mouth daily.    atorvastatin  (LIPITOR) 20 MG tablet Take 1 tablet (20 mg total) by mouth  daily.    cephALEXin  (KEFLEX ) 500 MG capsule Take 1 capsule (500 mg total) by mouth 4 (four) times daily.    cyanocobalamin  (VITAMIN B12) 1000 MCG tablet Take 1,000 mcg by mouth daily.    doxycycline  (VIBRAMYCIN ) 100 MG capsule Take 1 capsule (100 mg total) by mouth 2 (two) times daily.    feeding supplement (ENSURE ENLIVE / ENSURE PLUS) LIQD Take 237 mLs by mouth 3 (three) times daily with meals. (Patient not taking: Reported on 09/23/2022) 05/14/2022: 05/14/2022 Used 2 per day.   Garlic 500 MG CAPS Take 1 capsule by mouth daily.    Green Tea, Camellia sinensis, (GREEN TEA EXTRACT PO) Take 500 mg by mouth 2 (two) times daily.    meloxicam  (MOBIC ) 15 MG tablet Take 1 tablet (15 mg total) by mouth daily. (Patient not taking: Reported on 09/23/2022)    metFORMIN  (GLUCOPHAGE -XR) 500 MG 24 hr tablet TAKE 1 TABLET BY MOUTH DAILY WITH BREAKFAST    nitroGLYCERIN  (NITRODUR - DOSED IN MG/24 HR) 0.2 mg/hr patch Cut and apply 1/4 patch to most painful area q24h. (Patient not taking: Reported on 09/23/2022)    olmesartan  (BENICAR ) 20 MG tablet TAKE 1 TABLET BY MOUTH DAILY    polycarbophil (FIBERCON) 625 MG tablet Take 1 tablet (625 mg total) by mouth 2 (two) times daily.    PRESCRIPTION MEDICATION Inject 4 mLs as directed as needed (erectile disfunction). prostaglandin 04/26/2022: Pt unaware of dose.    Semaglutide ,0.25 or 0.5MG /DOS, (OZEMPIC , 0.25 OR 0.5 MG/DOSE,) 2 MG/3ML SOPN Inject 0.5 mg into the skin once a week.    No facility-administered encounter medications on file as of 04/06/2023.    ------------------------------------------------------------------------------------------------------  _____________________________________________________________ OBJECTIVE  PHYSICAL EXAM  Today's Vitals   04/06/23 0853 04/06/23 0944  BP: (!) 148/82 128/78  Weight: 251 lb (113.9 kg)   Height: 6' 1 (1.854 m)    Body mass index is 33.12 kg/m.   reviewed  General: A+Ox3, no acute distress, well-nourished,  appropriate affect CV: pulses 2+ regular, nondiaphoretic, no peripheral edema, cap refill <2sec Lungs: no audible wheezing, non-labored breathing, bilateral chest rise/fall, nontachypneic Skin: warm, well-perfused, non-icteric, no susp lesions or rashes Neuro: diminished distal foot sensation diffusely, muscle tone wnl, no atrophy Psych: no signs of depression or anxiety MSK: distal toe erythema with trace swelling, TTP distal toe, NTTP base 5th MT, other MT, phalanges, midfoot, malleoli, ankle, full ROM ankle   _____________________________________________________________ ASSESSMENT/PLAN Diagnoses and all orders for this visit:  Closed displaced fracture of proximal phalanx of lesser toe of right foot, initial encounter -     Ambulatory referral to Podiatry   R 5th closed mildly displaced proximal phalanx fracture, no articular involvement, improvement to erythema/cellulitis per ED notes and patient report, patient is on antibiotics. Prolonged visit today discussing films, answering questions/concerns. Unknown etiology of injury, as patient does not recall recent trauma. Advised continuing with full course of antibiotics, follow-up with PCP with concerns of refractory or worsening signs of erythema (is presently improving).  Continue in post-op shoe, WBAT. Discussed that bone healing anticipated to take several weeks, if not more. Otc pain management reviewed including RICE therapy. Tylenol /NSAIDs. May consider buddy taping for comfort; discussed that based on natural medial deviation of 5th phalanx may not necessarily benefit from buddy taping/increasing angle of medial angulation. Diminished light touch sensation of distal toes in setting of T2DM not at goal. Referral made to podiatry to establish care for routine foot care/diabetes monitoring in setting of suspected peripheral neuropathy, additional recommendations as indicated. Anticipate f/u 3-4 weeks for repeat films with our office or with  podiatry, return for re-evaluation/imaging sooner if with worsening symptoms. All questions answered. Return precautions discussed. Patient verbalized understanding and is in agreement with plan  Electronically signed by: Oyinkansola Truax W Ellene Bloodsaw, MD 04/06/2023 7:43 AM

## 2023-04-08 ENCOUNTER — Ambulatory Visit (INDEPENDENT_AMBULATORY_CARE_PROVIDER_SITE_OTHER): Payer: Medicare Other | Admitting: Podiatry

## 2023-04-08 DIAGNOSIS — S92501A Displaced unspecified fracture of right lesser toe(s), initial encounter for closed fracture: Secondary | ICD-10-CM

## 2023-04-08 NOTE — Progress Notes (Signed)
  Subjective:  Patient ID: Noah Lane, male    DOB: 1955-06-23,   MRN: [id number]  Chief Complaint  Patient presents with   Toe Pain    Pt presents Closed displaced fracture of proximal phalanx of lesser toe of right foot.     68 y.o. male presents for concern as above. Relates concern of right fifth toe injury. Relates he noticed on Friday the right fifth toe was painful. He was seen in ED and X-rays taken. Given shoe and told to follow-up . He did have some concern for cellultisi and antibtioics were given and still taking.   . Denies any other pedal complaints. Denies n/v/f/c.   Past Medical History:  Diagnosis Date   Diabetes mellitus type 2 in obese    Mixed hyperlipidemia     Objective:  Physical Exam: Vascular: DP/PT pulses 2/4 bilateral. CFT <3 seconds. Normal hair growth on digits. No edema.  Skin. No lacerations or abrasions bilateral feet.  Musculoskeletal: MMT 5/5 bilateral lower extremities in DF, PF, Inversion and Eversion. Deceased ROM in DF of ankle joint.  Neurological: Sensation intact to light touch.   Assessment:   1. Closed fracture of phalanx of right fifth toe, initial encounter      Plan:  Patient was evaluated and treated and all questions answered. -Xrays reviewed. Proximal phalanx fracture of fifth digit on right with minimal displacement.  -Discussed treatement options for toe fracture; risks, alternatives, and benefits explained. -Continue  shoe. Patient to wear at all times and instructed on use -Recommend protection, rest, ice, elevation daily until symptoms improve -anti-inflammatories as needed -Patient to return to office in 4 weeks for serial x-rays to assess healing  or sooner if condition worsens.    Asberry Failing, DPM

## 2023-04-20 NOTE — Telephone Encounter (Signed)
Following up on pt PAP for [employer] spoke with representative they said pt is missing a pg from Dr portion will follow up with Dr office  and re- fax to [employer].

## 2023-04-25 NOTE — Telephone Encounter (Signed)
 Re faxing missing imf! To [employer] ,[facility] said we are missing pg 8 from provider portion,faxes today and will follow up in a couple of days.

## 2023-04-26 NOTE — Telephone Encounter (Signed)
 Spoke with [employer] Representative,pt is missing providers Imf.unsigned pg #8 faxed it back to dr office will follow up in a few days,and re fax to company.

## 2023-05-02 ENCOUNTER — Other Ambulatory Visit: Payer: Self-pay | Admitting: Family Medicine

## 2023-05-02 NOTE — Telephone Encounter (Signed)
 Received provider portion faxed it to [employer] today will follow up with [employer] in a few day.

## 2023-05-03 ENCOUNTER — Encounter: Payer: Self-pay | Admitting: Family Medicine

## 2023-05-06 ENCOUNTER — Other Ambulatory Visit: Payer: Self-pay

## 2023-05-06 ENCOUNTER — Ambulatory Visit (INDEPENDENT_AMBULATORY_CARE_PROVIDER_SITE_OTHER): Payer: Medicare Other | Admitting: Podiatry

## 2023-05-06 ENCOUNTER — Telehealth: Payer: Self-pay

## 2023-05-06 ENCOUNTER — Encounter: Payer: Self-pay | Admitting: Podiatry

## 2023-05-06 ENCOUNTER — Ambulatory Visit

## 2023-05-06 DIAGNOSIS — S92501A Displaced unspecified fracture of right lesser toe(s), initial encounter for closed fracture: Secondary | ICD-10-CM

## 2023-05-06 DIAGNOSIS — B351 Tinea unguium: Secondary | ICD-10-CM | POA: Diagnosis not present

## 2023-05-06 DIAGNOSIS — E1142 Type 2 diabetes mellitus with diabetic polyneuropathy: Secondary | ICD-10-CM | POA: Diagnosis not present

## 2023-05-06 DIAGNOSIS — S92501D Displaced unspecified fracture of right lesser toe(s), subsequent encounter for fracture with routine healing: Secondary | ICD-10-CM | POA: Diagnosis not present

## 2023-05-06 DIAGNOSIS — M79675 Pain in left toe(s): Secondary | ICD-10-CM

## 2023-05-06 DIAGNOSIS — M79674 Pain in right toe(s): Secondary | ICD-10-CM | POA: Diagnosis not present

## 2023-05-06 NOTE — Progress Notes (Signed)
  Subjective:  Patient ID: Noah Lane, male    DOB: Sep 05, 1955,   MRN: [id number]  No chief complaint on file.   68 y.o. male presents for follow-up of toe fracture. Relates doing well and no pain. Has been wearing surgical shoe. Also concern of thickened elongated and painful nails that are difficult to trim. Requesting to have them trimmed today. Relates burning and tingling in their feet. Patient is diabetic and last A1c was  Lab Results  Component Value Date   HGBA1C 8.8 (H) 04/22/2022   .   PCP:  Sharlene Dory, DO      . Denies any other pedal complaints. Denies n/v/f/c.   Past Medical History:  Diagnosis Date   Diabetes mellitus type 2 in obese    Mixed hyperlipidemia     Objective:  Physical Exam: Vascular: DP/PT pulses 2/4 bilateral. CFT <3 seconds. Absent hair growth on digits. Edema noted to bilateral lower extremities. Xerosis noted bilaterally.  Skin. No lacerations or abrasions bilateral feet. Nails 1-5 bilateral  are thickened discolored and elongated with subungual debris.  Musculoskeletal: MMT 5/5 bilateral lower extremities in DF, PF, Inversion and Eversion. Deceased ROM in DF of ankle joint. No pain to palpation about the toe.  Neurological: Sensation intact to light touch. Protective sensation diminished bilateral.    Assessment:   1. Closed fracture of phalanx of right fifth toe with routine healing, subsequent encounter   2. Pain due to onychomycosis of toenails of both feet   3. Type 2 diabetes mellitus with peripheral neuropathy (HCC)       Plan:  Patient was evaluated and treated and all questions answered. -Xrays reviewed. Proximal phalanx fracture of fifth digit on right with minimal displacement. Minor healing noted. Healed fracture of the fifth metatarsal base -Discussed treatement options for toe fracture; risks, alternatives, and benefits explained. -May discontinue shoe and return to normal acitvity.  -Discussed and educated  patient on diabetic foot care, especially with  regards to the vascular, neurological and musculoskeletal systems.  -Stressed the importance of good glycemic control and the detriment of not  controlling glucose levels in relation to the foot. -Discussed supportive shoes at all times and checking feet regularly.  -Mechanically debrided all nails 1-5 bilateral using sterile nail nipper and filed with dremel without incident  -Answered all patient questions -Patient to return  in 3 months for at risk foot care -Patient advised to call the office if any problems or questions arise in the meantime.     Louann Sjogren, DPM

## 2023-05-06 NOTE — Telephone Encounter (Signed)
 PAP: Patient assistance application for Ozempic has been approved by PAP Companies: [employer] from 05/04/2023 to 02/29/2024. Medication should be delivered to PAP Delivery: Provider's office. For further shipping updates, please [employer] at [phone]. Patient ID is: not provided

## 2023-05-17 ENCOUNTER — Ambulatory Visit (INDEPENDENT_AMBULATORY_CARE_PROVIDER_SITE_OTHER): Admitting: Family Medicine

## 2023-05-17 ENCOUNTER — Encounter: Payer: Self-pay | Admitting: Family Medicine

## 2023-05-17 VITALS — BP 118/72 | HR 62 | Ht 73.0 in | Wt 260.4 lb

## 2023-05-17 DIAGNOSIS — E1169 Type 2 diabetes mellitus with other specified complication: Secondary | ICD-10-CM | POA: Diagnosis not present

## 2023-05-17 DIAGNOSIS — K625 Hemorrhage of anus and rectum: Secondary | ICD-10-CM

## 2023-05-17 DIAGNOSIS — E669 Obesity, unspecified: Secondary | ICD-10-CM

## 2023-05-17 DIAGNOSIS — Z7984 Long term (current) use of oral hypoglycemic drugs: Secondary | ICD-10-CM

## 2023-05-17 DIAGNOSIS — E782 Mixed hyperlipidemia: Secondary | ICD-10-CM

## 2023-05-17 DIAGNOSIS — Z7985 Long-term (current) use of injectable non-insulin antidiabetic drugs: Secondary | ICD-10-CM

## 2023-05-17 LAB — MICROALBUMIN / CREATININE URINE RATIO
Creatinine,U: 57.7 mg/dL
Microalb Creat Ratio: 37.7 mg/g — ABNORMAL HIGH (ref 0.0–30.0)
Microalb, Ur: 2.2 mg/dL — ABNORMAL HIGH (ref 0.0–1.9)

## 2023-05-17 NOTE — Progress Notes (Signed)
 Chief Complaint  Patient presents with   Medical Management of Chronic Issues    Patient presents today for a follow-up appointment    Subjective: Patient is a 68 y.o. male here for constipation.  Had some constipation 2 d ago. Following then, he had some blood in the toilet tissue. Dark red in color. Has not noticed some rectal soreness from the straining. No unintentional wt loss. +hx of CC.   Patient has a history of diabetes.  He has a CGM with average sugars in the mid 100s over the past 3 months.  Diet could be better.  He tries to stay active physically.  No lows.  He is compliant with Ozempic 0.5 mg weekly, metformin XR 500 mg daily.  His last eye exam was around 8 months ago.  He would like a referral to one now.  Mixed hyperlipidemia-taking Lipitor 20 mg daily.  Reports compliance and no adverse effects.  Diet/exercise as above.  No chest pain or shortness of breath.  No known history of coronary artery disease.  Past Medical History:  Diagnosis Date   Diabetes mellitus type 2 in obese    Mixed hyperlipidemia     Objective: BP 118/72   Pulse 62   Ht 6\' 1"  (1.854 m)   Wt 260 lb 6.4 oz (118.1 kg)   SpO2 97%   BMI 34.36 kg/m  General: Awake, appears stated age Heart: RRR, no LE edema, no bruits Lungs: CTAB, no rales, wheezes or rhonchi. No accessory muscle use Rectal: No external lesions or hemorrhoids appreciated.  No active bleeding. Abdomen: Bowel sounds present, soft, nontender, nondistended Psych: Age appropriate judgment and insight, normal affect and mood  Assessment and Plan: Rectal bleeding - Plan: CBC  Type 2 diabetes mellitus with obesity (HCC) - Plan: Comprehensive metabolic panel, Lipid panel, Microalbumin / creatinine urine ratio, Hemoglobin A1c, Ambulatory referral to Ophthalmology  Mixed hyperlipidemia  No obvious bleeding today.  Seems to be improving, could be due to internal hemorrhoids.  Bowel movements are improving.  Will check CBC and have them  reach out to his GI team.  No unintentional weight loss or pain. Chronic, hopefully controlled.  Continue Ozempic at 0.5 mg weekly, metformin XR 500 mg daily.  Counseled on diet and exercise.  Will place referral to ophthalmology at his request. Chronic, probably controlled.  Continue Lipitor 20 mg daily.  Check labs. The patient voiced understanding and agreement to the plan.  Jilda Roche [place], DO 05/17/23  1:14 PM

## 2023-05-17 NOTE — Patient Instructions (Addendum)
 Give [place] 2-3 business days to get the results of your labs back.   Consider getting your tetanus booster at the pharmacy.   If you do not hear anything about your referral in the next 1-2 weeks, call our office and ask for an update.  Please contact the GI team at: [phone]  Let us know if you need anything.

## 2023-05-18 ENCOUNTER — Encounter: Payer: Self-pay | Admitting: Family Medicine

## 2023-05-18 ENCOUNTER — Telehealth: Payer: Self-pay | Admitting: Pharmacist

## 2023-05-18 LAB — COMPREHENSIVE METABOLIC PANEL
ALT: 19 U/L (ref 0–53)
AST: 16 U/L (ref 0–37)
Albumin: 4.9 g/dL (ref 3.5–5.2)
Alkaline Phosphatase: 48 U/L (ref 39–117)
BUN: 18 mg/dL (ref 6–23)
CO2: 27 meq/L (ref 19–32)
Calcium: 9.3 mg/dL (ref 8.4–10.5)
Chloride: 100 meq/L (ref 96–112)
Creatinine, Ser: 1.06 mg/dL (ref 0.40–1.50)
GFR: 71.73 mL/min (ref >60.00)
Glucose, Bld: 105 mg/dL — ABNORMAL HIGH (ref 70–99)
Potassium: 4.7 meq/L (ref 3.5–5.1)
Sodium: 137 meq/L (ref 135–145)
Total Bilirubin: 1.9 mg/dL — ABNORMAL HIGH (ref 0.2–1.2)
Total Protein: 7.2 g/dL (ref 6.0–8.3)

## 2023-05-18 LAB — CBC
HCT: 44.1 % (ref 39.0–52.0)
Hemoglobin: 14.9 g/dL (ref 13.0–17.0)
MCHC: 33.8 g/dL (ref 30.0–36.0)
MCV: 87.1 fl (ref 78.0–100.0)
Platelets: 170 10*3/uL (ref 150.0–400.0)
RBC: 5.06 Mil/uL (ref 4.22–5.81)
RDW: 14.1 % (ref 11.5–15.5)
WBC: 8.6 10*3/uL (ref 4.0–10.5)

## 2023-05-18 LAB — LIPID PANEL
Cholesterol: 143 mg/dL (ref 0–200)
HDL: 42.2 mg/dL (ref >39.00)
LDL Cholesterol: 67 mg/dL (ref 0–99)
NonHDL: 100.67
Total CHOL/HDL Ratio: 3
Triglycerides: 170 mg/dL — ABNORMAL HIGH (ref 0.0–149.0)
VLDL: 34 mg/dL (ref 0.0–40.0)

## 2023-05-18 LAB — HEMOGLOBIN A1C: Hgb A1c MFr Bld: 7.1 % — ABNORMAL HIGH (ref 4.6–6.5)

## 2023-05-18 NOTE — Telephone Encounter (Signed)
 Spoke with patient to make sure he has Ozempic to last until patient assistance program supply is delivered. Deliver for [employer] patient assistance program has been delayed recently.  Patient reports he has 2 pens which should last for about 56 days. He is to call office if he starts to use the last pen but has not received a phone call that Ozempic has been received in our office from [employer] patient assistance program.

## 2023-05-20 ENCOUNTER — Telehealth: Payer: Self-pay

## 2023-05-20 NOTE — Telephone Encounter (Signed)
 Pt PAP assistance ozempic arrived today, placed in refrigerator and pt notified via mychart

## 2023-05-25 LAB — HM DIABETES EYE EXAM

## 2023-05-27 ENCOUNTER — Encounter: Payer: Self-pay | Admitting: Gastroenterology

## 2023-05-27 ENCOUNTER — Ambulatory Visit: Admitting: Gastroenterology

## 2023-05-27 VITALS — BP 140/68 | HR 59 | Ht 73.0 in | Wt 254.0 lb

## 2023-05-27 DIAGNOSIS — K625 Hemorrhage of anus and rectum: Secondary | ICD-10-CM | POA: Diagnosis not present

## 2023-05-27 DIAGNOSIS — K648 Other hemorrhoids: Secondary | ICD-10-CM

## 2023-05-27 DIAGNOSIS — Z9049 Acquired absence of other specified parts of digestive tract: Secondary | ICD-10-CM | POA: Diagnosis not present

## 2023-05-27 DIAGNOSIS — C189 Malignant neoplasm of colon, unspecified: Secondary | ICD-10-CM

## 2023-05-27 DIAGNOSIS — K5900 Constipation, unspecified: Secondary | ICD-10-CM

## 2023-05-27 DIAGNOSIS — Z85038 Personal history of other malignant neoplasm of large intestine: Secondary | ICD-10-CM

## 2023-05-27 NOTE — Progress Notes (Signed)
 Chief Complaint: Constipation and rectal bleeding Primary GI MD: Dr. 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 History of colonic obstruction at the level of the hepatic flexure suspicious for colonic neoplasm with colitis with marked distention of cecum and ascending colon dilated to a centimeter and associated with surrounding stranding and trace fluid.  Findings concerning for stercoral colitis associated with colonic obstruction.   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 Myna Hidalgo oncology  He had a postop colonoscopy May 2024 that showed a patent anastomosis with 3 tubular adenomas with a recall for 2 years. Also found internal hemorrhoids.  Discussed the use of AI scribe software for clinical note transcription with the patient, who gave verbal consent to proceed.  He experienced a recent episode of constipation and rectal bleeding. Approximately a week and a half ago, he had constipation that lasted an entire morning, which he described as 'beyond description'. Over-the-counter products were used to alleviate the constipation, which he believes were effective. The rectal bleeding was noted as bright red on the tissue paper and occurred during straining, lasting until the following day. No bleeding was observed in the stool.  He is currently on Ozempic for blood sugar management, which he started after his colon surgery. Initially, the dose was 0.25 mg for the first month or two, and it was later increased to 0.5 mg. He acknowledges that Ozempic may contribute to constipation due to its mechanism of slowing gastric emptying. Despite this, he has not experienced significant issues with constipation since starting Ozempic  Has  not had constipation or rectal bleeding since.     PREVIOUS GI WORKUP   Colonoscopy 06/2022 - Patent side- to- side ileo- colonic anastomosis, characterized by healthy appearing mucosa.  - One 3 mm polyp in the ascending colon, removed with a cold biopsy forceps. Resected and retrieved.  - Two 7 mm polyps in the ascending colon, removed with a cold snare. Resected and retrieved.  - Anal papilla( e) were hypertrophied.  - Internal hemorrhoids.  - The examination was otherwise normal on direct and retroflexion views. - repeat 2 years 06/2024  Diagnosis Surgical [P], colon, ascending, polyp (3) FRAGMENTS OF TUBULAR ADENOMA. NEGATIVE FOR HIGH-GRADE DYSPLASIA.   Past Medical History:  Diagnosis Date   Broken toe    Diabetes mellitus type 2 in obese    Mixed hyperlipidemia     Past Surgical History:  Procedure Laterality Date   COLON RESECTION N/A 04/27/2022   Procedure: LAPAROSCOPIC HAND ASSISTED RIGHT HEMI COLECTOMY, POSSIBLE OPEN;  Surgeon: Fritzi Mandes, MD;  Location: [facility];  Service: General;  Laterality: N/A;   COLONOSCOPY     HERNIA REPAIR  2012    Current Outpatient Medications  Medication Sig Dispense Refill   atorvastatin (LIPITOR) 20 MG tablet Take 1 tablet (20 mg total) by mouth daily. 90 tablet 3   metFORMIN (GLUCOPHAGE-XR) 500 MG 24 hr tablet TAKE 1 TABLET BY MOUTH DAILY WITH BREAKFAST 88 tablet 1   olmesartan (BENICAR) 20 MG tablet TAKE 1 TABLET BY MOUTH DAILY 90 tablet 1   PRESCRIPTION MEDICATION Inject 4 mLs as directed as needed (erectile disfunction). prostaglandin     Semaglutide,0.25 or 0.5MG /DOS, (OZEMPIC, 0.25 OR 0.5 MG/DOSE,) 2 MG/3ML SOPN Inject 0.5 mg into the  skin once a week.     No current facility-administered medications for this visit.    Allergies as of 05/27/2023   (No Known Allergies)    Family History  Problem Relation Age of Onset   Heart disease Mother    Diabetes Father    Cancer Father        post age of 27   Colon cancer Neg  Hx    Colon polyps Neg Hx    Esophageal cancer Neg Hx    Rectal cancer Neg Hx    Stomach cancer Neg Hx     Social History   Socioeconomic History   Marital status: Married    Spouse Noah Lane: [name]   Number of children: Not on file   Years of education: Not on file   Highest education level: Associate degree: academic program  Occupational History   Occupation: retired    Comment: self employed-still working some  Tobacco Use   Smoking status: Former    Current packs/day: 0.00    Average packs/day: 0.5 packs/day for 15.0 years (7.5 ttl pk-yrs)    Types: Cigarettes    Start date: 37    Quit date: 1993    Years since quitting: 32.2   Smokeless tobacco: Never  Vaping Use   Vaping status: Never Used  Substance and Sexual Activity   Alcohol use: Yes    Alcohol/week: 1.0 standard drink of alcohol    Types: 1 Glasses of wine per week    Comment: 1.5 4oz glasses per week of wine   Drug use: Never   Sexual activity: Not Currently  Other Topics Concern   Not on file  Social History Narrative   Not on file   Social Drivers of Health   Financial Resource Strain: Low Risk  (05/16/2023)   Overall Financial Resource Strain (CARDIA)    Difficulty of Paying Living Expenses: Not hard at all  Food Insecurity: No Food Insecurity (05/16/2023)   Hunger Vital Sign    Worried About Running Out of Food in the Last Year: Never true    Ran Out of Food in the Last Year: Never true  Transportation Needs: No Transportation Needs (05/16/2023)   PRAPARE - Administrator, Civil Service (Medical): No    Lack of Transportation (Non-Medical): No  Physical Activity: Insufficiently Active (05/16/2023)   Exercise Vital Sign    Days of Exercise per Week: 4 days    Minutes of Exercise per Session: 30 min  Stress: No Stress Concern Present (05/16/2023)   [employer] of Occupational Health - Occupational Stress Questionnaire    Feeling of Stress : Only a little  Social Connections:  Socially Integrated (05/16/2023)   Social Connection and Isolation Panel [NHANES]    Frequency of Communication with Friends and Family: Twice a week    Frequency of Social Gatherings with Friends and Family: Once a week    Attends Religious Services: More than 4 times per year    Active Member of [employer] or Organizations: Yes    Attends Engineer, structural: More than 4 times per year    Marital Status: Married  Catering manager Violence: Not At Risk (09/23/2022)   Humiliation, Afraid, Rape, and Kick questionnaire    Fear of Current or Ex-Partner: No    Emotionally Abused: No    Physically Abused: No    Sexually Abused: No    Review of Systems:    Constitutional: No weight loss, fever, chills, weakness or fatigue  HEENT: Eyes: No change in vision               Ears, Nose, Throat:  No change in hearing or congestion Skin: No rash or itching Cardiovascular: No chest pain, chest pressure or palpitations   Respiratory: No SOB or cough Gastrointestinal: See HPI and otherwise negative Genitourinary: No dysuria or change in urinary frequency Neurological: No headache, dizziness or syncope Musculoskeletal: No new muscle or joint pain Hematologic: No bleeding or bruising Psychiatric: No history of depression or anxiety    Physical Exam:  Vital signs: BP (!) 140/68   Pulse (!) 59   Ht 6\' 1"  (1.854 m)   Wt 254 lb (115.2 kg)   BMI 33.51 kg/m   Constitutional: NAD, Well developed, Well nourished, alert and cooperative Head:  Normocephalic and atraumatic. Eyes:   PEERL, EOMI. No icterus. Conjunctiva pink. Respiratory: Respirations even and unlabored. Lungs clear to auscultation bilaterally.   No wheezes, crackles, or rhonchi.  Cardiovascular:  Regular rate and rhythm. No peripheral edema, cyanosis or pallor.  Gastrointestinal:  Soft, nondistended, nontender. No rebound or guarding. Normal bowel sounds. No appreciable masses or hepatomegaly. Rectal:  Not performed. Patient  declined Msk:  Symmetrical without gross deformities. Without edema, no deformity or joint abnormality.  Neurologic:  Alert and  oriented x4;  grossly normal neurologically.  Skin:   Dry and intact without significant lesions or rashes. Psychiatric: Oriented to person, place and time. Demonstrates good judgement and reason without abnormal affect or behaviors.  RELEVANT LABS AND IMAGING: CBC    Component Value Date/Time   WBC 8.6 05/17/2023 1313   RBC 5.06 05/17/2023 1313   HGB 14.9 05/17/2023 1313   HGB 14.2 08/26/2022 1400   HCT 44.1 05/17/2023 1313   PLT 170.0 05/17/2023 1313   PLT 172 08/26/2022 1400   MCV 87.1 05/17/2023 1313   MCH 27.9 08/26/2022 1400   MCHC 33.8 05/17/2023 1313   RDW 14.1 05/17/2023 1313   LYMPHSABS 1.9 08/26/2022 1400   MONOABS 0.8 08/26/2022 1400   EOSABS 0.2 08/26/2022 1400   BASOSABS 0.1 08/26/2022 1400    CMP     Component Value Date/Time   NA 137 05/17/2023 1313   K 4.7 05/17/2023 1313   CL 100 05/17/2023 1313   CO2 27 05/17/2023 1313   GLUCOSE 105 (H) 05/17/2023 1313   BUN 18 05/17/2023 1313   CREATININE 1.06 05/17/2023 1313   CREATININE 1.26 (H) 08/26/2022 1400   CREATININE 1.11 05/23/2020 1628   CALCIUM 9.3 05/17/2023 1313   PROT 7.2 05/17/2023 1313   ALBUMIN 4.9 05/17/2023 1313   AST 16 05/17/2023 1313   AST 17 08/26/2022 1400   ALT 19 05/17/2023 1313   ALT 14 08/26/2022 1400   ALKPHOS 48 05/17/2023 1313   BILITOT 1.9 (H) 05/17/2023 1313   BILITOT 1.6 (H) 08/26/2022 1400   GFRNONAA >60 08/26/2022 1400     Assessment/Plan:     Constipation with rectal bleeding Bleeding likely from internal hemorrhoids, exacerbated by Ozempic-induced constipation. Recent colonoscopy showed no concerning findings but did confirm internal hemorrhoids. Constipation and bleeding for one day and then resolved. - Recommend MiraLAX for constipation management, regularly or as needed. - Prescribe suppositories for hemorrhoid-related bleeding if  recurs. - Advise reporting any increase in symptoms and if recurrent symptoms we will consider early colonoscopy.  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 No concerning findings, low risk of cancer progression.  Following with oncology and due for repeat colonoscopy 2026. - Schedule follow-up colonoscopy next year unless symptoms change.      Assigned to Dr. Adela Lank today  Boone Master, PA-C [facility] 05/27/2023, 9:36 AM  Cc: Sharlene Dory*

## 2023-05-27 NOTE — Patient Instructions (Addendum)
 _______________________________________________________  If your blood pressure at your visit was 140/90 or greater, please contact your primary care physician to follow up on this.  _______________________________________________________  If you are age 70 or older, your body mass index should be between 23-30. Your Body mass index is 33.51 kg/m. If this is out of the aforementioned range listed, please consider follow up with your Primary Care Provider.  If you are age 10 or younger, your body mass index should be between 19-25. Your Body mass index is 33.51 kg/m. If this is out of the aformentioned range listed, please consider follow up with your Primary Care Provider.   ________________________________________________________  The [facility] providers would like to encourage you to use [facility] to communicate with providers for non-urgent requests or questions.  Due to long hold times on the telephone, sending your provider a message by [facility] may be a faster and more efficient way to get a response.  Please allow 48 business hours for a response.  Please remember that this is for non-urgent requests.  _______________________________________________________  Follow Up as needed.  Thank you for trusting me with your gastrointestinal care!   Boone Master, PA

## 2023-05-27 NOTE — Progress Notes (Signed)
 Agree with assessment and plan as outlined.

## 2023-06-29 ENCOUNTER — Other Ambulatory Visit: Payer: Self-pay | Admitting: Family Medicine

## 2023-07-04 ENCOUNTER — Ambulatory Visit: Admitting: Family Medicine

## 2023-07-06 ENCOUNTER — Encounter: Payer: Self-pay | Admitting: Family Medicine

## 2023-07-06 ENCOUNTER — Ambulatory Visit (INDEPENDENT_AMBULATORY_CARE_PROVIDER_SITE_OTHER): Admitting: Family Medicine

## 2023-07-06 VITALS — BP 130/82 | HR 76 | Temp 98.0°F | Resp 16 | Ht 73.0 in | Wt 257.0 lb

## 2023-07-06 DIAGNOSIS — R42 Dizziness and giddiness: Secondary | ICD-10-CM | POA: Diagnosis not present

## 2023-07-06 NOTE — Progress Notes (Signed)
 Chief Complaint  Patient presents with   Dizziness    Dizziness     Noah Lane is 68 y.o. pt here for dizziness.  Duration: 1 week Pass out? No Turning head makes it worse.  Spinning? Yes Recent illness/fever? No Threw up early in AM.  Headache? No Neurologic signs? No Change in PO intake? No He did throw up.  Palpitations? No  Past Medical History:  Diagnosis Date   Broken toe    Diabetes mellitus type 2 in obese    Mixed hyperlipidemia     Family History  Problem Relation Age of Onset   Heart disease Mother    Diabetes Father    Cancer Father        post age of 60   Colon cancer Neg Hx    Colon polyps Neg Hx    Esophageal cancer Neg Hx    Rectal cancer Neg Hx    Stomach cancer Neg Hx     Allergies as of 07/06/2023   No Known Allergies      Medication List        Accurate as of Jul 06, 2023  2:42 PM. If you have any questions, ask your nurse or doctor.          atorvastatin  20 MG tablet Commonly known as: LIPITOR TAKE 1 TABLET BY MOUTH DAILY   metFORMIN  500 MG 24 hr tablet Commonly known as: GLUCOPHAGE -XR TAKE 1 TABLET BY MOUTH DAILY WITH BREAKFAST   olmesartan  20 MG tablet Commonly known as: BENICAR  TAKE 1 TABLET BY MOUTH DAILY   Ozempic  (0.25 or 0.5 MG/DOSE) 2 MG/3ML Sopn Generic drug: Semaglutide (0.25 or 0.5MG /DOS) Inject 0.5 mg into the skin once a week.   PRESCRIPTION MEDICATION Inject 4 mLs as directed as needed (erectile disfunction). prostaglandin        BP 130/82 (BP Location: Left Arm, Patient Position: Sitting)   Pulse 76   Temp 98 F (36.7 C) (Oral)   Resp 16   Ht 6\' 1"  (1.854 m)   Wt 257 lb (116.6 kg)   SpO2 97%   BMI 33.91 kg/m  General: Awake, alert, appears stated age Eyes: PERRLA, EOMi Ears: Patent, TM's neg b/l Heart: RRR, no murmurs, no carotid bruits Lungs: CTAB, no accessory muscle use MSK: 5/5 strength throughout, gait normal Neuro: No cerebellar signs, patellar reflex 1/4 b/l wo clonus, calcaneal  reflex 0/4 b/l wo clonus, biceps reflex 1/4 b/l wo clonus; Dix-Hall-Pike negative b/l. Psych: Age appropriate judgment and insight, normal mood and affect  Vertigo  Seems to be improving. Stay hydrated. Epley Maneuvers provided in AVS should he need to use it.  F/u prn. Pt voiced understanding and agreement to the plan.  Shellie Dials [place], DO 07/06/23 2:42 PM

## 2023-07-06 NOTE — Patient Instructions (Signed)
Stay hydrated.  Let us know if you need anything.  

## 2023-07-12 ENCOUNTER — Ambulatory Visit (INDEPENDENT_AMBULATORY_CARE_PROVIDER_SITE_OTHER): Admitting: Sports Medicine

## 2023-07-12 ENCOUNTER — Encounter: Payer: Self-pay | Admitting: Sports Medicine

## 2023-07-12 VITALS — BP 158/86 | Ht 73.0 in | Wt 257.0 lb

## 2023-07-12 DIAGNOSIS — M51369 Other intervertebral disc degeneration, lumbar region without mention of lumbar back pain or lower extremity pain: Secondary | ICD-10-CM

## 2023-07-12 NOTE — Progress Notes (Addendum)
   Subjective:    Patient ID: Noah Lane, male    DOB: 01/06/1956, 68 y.o.   MRN: [id number]  HPI chief complaint: Low back pain  Patient is a very pleasant 68 year old male that presents today with low back pain which has been reoccurring over the past month or so.  He describes a sharp sudden pain along the right lower spine.  Seems to be worse with sitting and lying.  It is less bothersome with standing.  He has had low back pain in the past as well as issues with his piriformis.  However, his current pain is different in nature than what he experienced previously.  He is leaving for a cruise at the end of the month.  Past medical history reviewed Medications reviewed Allergies reviewed  Review of Systems As above    Objective:   Physical Exam  Well-developed, well-nourished.  No acute distress  Lumbar spine: Good lumbar range of motion.  No pain with flexion or extension.  No tenderness to palpation.  No spasm.  Negative Faber.  Neurovascularly intact distally.  X-rays of his lumbar spine from 2023 showed multilevel degenerative changes both at the disc space as well as in the facets.     Assessment & Plan:   Lumbar degenerative disc disease with concomitant facet arthropathy  This pain may be facet mediated.  He has done well with physical therapy in the past.  We will refer him back to physical therapy here at our [facility] location.  If pain persists that he will return to the office for consideration of an IM Depo-Medrol  injection just prior to leaving for his cruise.  Otherwise, follow-up as needed.  This note was dictated using Dragon naturally speaking software and may contain errors in syntax, spelling, or content which have not been identified prior to signing this note.

## 2023-07-21 NOTE — Therapy (Signed)
 OUTPATIENT PHYSICAL THERAPY THORACOLUMBAR EVALUATION   Patient Name: Bobak Oguinn MRN: [id number] DOB:Aug 23, 1955, 68 y.o., male Today's Date: 08/03/2023  END OF SESSION:  PT End of Session - 08/03/23 0805     Visit Number 1    Date for PT Re-Evaluation 10/12/23    Authorization Type Medicare & BCBS    Progress Note Due on Visit 10    PT Start Time 0805    PT Stop Time 0914    PT Time Calculation (min) 69 min    Activity Tolerance Patient tolerated treatment well    Behavior During Therapy WFL for tasks assessed/performed             Past Medical History:  Diagnosis Date   Broken toe    Diabetes mellitus type 2 in obese    Mixed hyperlipidemia    Past Surgical History:  Procedure Laterality Date   COLON RESECTION N/A 04/27/2022   Procedure: LAPAROSCOPIC HAND ASSISTED RIGHT HEMI COLECTOMY, POSSIBLE OPEN;  Surgeon: Lujean Sake, MD;  Location: [facility];  Service: General;  Laterality: N/A;   COLONOSCOPY     HERNIA REPAIR  2012   Patient Active Problem List   Diagnosis Date Noted   Elevated blood pressure reading 05/04/2022   Colon adenocarcinoma (HCC) 05/04/2022   Gastroesophageal reflux disease 04/29/2022   Ileus, postoperative (HCC) 04/29/2022   Hypokalemia 04/28/2022   Hyperlipidemia 04/28/2022   Colonic obstruction (HCC) 04/25/2022   Hyponatremia 04/25/2022   OSA on CPAP 12/03/2021   Obesity (BMI 30.0-34.9) 12/03/2021   Mixed hyperlipidemia 10/20/2021   Strain of lumbar region 10/16/2021   Strain of calf muscle 07/22/2021   Patellofemoral pain syndrome of both knees 02/24/2021   Rotator cuff tendinitis, right 12/28/2019   Hip pain, bilateral 05/28/2019   Chronic bilateral low back pain without sciatica 05/28/2019   Type 2 diabetes mellitus with obesity (HCC) 05/28/2019   Erectile dysfunction 05/28/2019   Pain and swelling of left knee 03/12/2019   Lateral epicondylitis, right elbow 03/15/2017   Diverticulosis of large intestine without hemorrhage  02/25/2014    PCP: Jobe Mulder, DO   REFERRING PROVIDER: Frazier Jacob, DO   REFERRING DIAG: [phone] (ICD-10-CM) - Degeneration of intervertebral disc of lumbar region, unspecified whether pain present   THERAPY DIAG:  Other low back pain  Radiculopathy, cervical region  Muscle weakness (generalized)  Abnormal posture  Unsteadiness on feet  RATIONALE FOR EVALUATION AND TREATMENT: Rehabilitation  ONSET DATE: early April 2025  NEXT MD VISIT: None scheduled with referring provider, 08/19/23 with PCP   SUBJECTIVE:  SUBJECTIVE STATEMENT: Pt reports he started getting LBP, mostly on R side, ~2 months ago w/o known MOI.  Pain first noted while rolling over in bed.  Pain comes in "strikes" w/o predictable trigger.  Pain is momentary lasting a few minutes when present.  He also reports recent onset of L UE numbness and tingling that he says Dr. Gwenette Lennox recommended he mention to PT.  UE radiculopathy also unpredictable in trigger and varies in duration.  He notes constant stiffness in neck, but denies pain.  He also reports balance issues, esp initially on standing, which he attributes to a weak core and his neuropathy.  PAIN: Are you having pain? Yes: NPRS scale: 0/10 currently, up to at least 7/10  Pain location: R low back  Pain description: sharp, short-lived  Aggravating factors: unpredictable  Relieving factors: remain still until pain resolves   Are you having pain? Yes: NPRS scale: 3-5/10 currently, up to 8-9/10  Pain location: R UE down to radial side of hand, occasional in thumb  Pain description: vibration (reminds him of a TENS unit), stiffness in neck  Aggravating factors: unpredictable  Relieving factors: heating pads, stretches   PERTINENT HISTORY:   DM-II, obesity, elevated BP, GERD, B patellofemoral pain syndrome, B hip pain, h/o R RTC tendinitis, chronic B LBP w/o sciatica, scoliosis, diabetic peripheral neuropathy  PRECAUTIONS: None  RED FLAGS: None  WEIGHT BEARING RESTRICTIONS: No  FALLS:  Has patient fallen in last 6 months? 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 I would like the shoulder thing to go away. Better balance."   OBJECTIVE: (objective measures completed at initial evaluation unless otherwise dated)  DIAGNOSTIC FINDINGS:  10/16/21 - DG lumbar spine IMPRESSION: No acute fracture or subluxation of the lumbar spine.  PATIENT SURVEYS:  Modified Oswestry 18 / 50 = 36.0 %, moderate disability   Quick Dash 29.5 / 100 = 29.5 %  COGNITION:  Overall cognitive status: History of cognitive impairments - at baseline (mostly STM memory recall)    SENSATION: WFL B peripheral neuropathy in feet  POSTURE:  rounded shoulders, forward head, decreased lumbar lordosis, increased thoracic kyphosis, and flexed trunk   PALPATION: Increased muscle tension in L>R UT and LS as well as R>L lumbar paraspinals and glutes/piriformis  CERVICAL ROM:   Active ROM Eval  Flexion 49  Extension 42  Right lateral flexion 24  Left lateral flexion 16  Right rotation 51  Left rotation 52   (Blank rows = not tested)  UPPER EXTREMITY MMT:  MMT Right eval Left eval  Shoulder flexion 4+ 4-  Shoulder extension 4+ 4+  Shoulder  abduction 4+ 4  Shoulder adduction    Shoulder internal rotation 4+ 4+  Shoulder external rotation 4 4  Middle trapezius 4- 4-  Lower trapezius 3+ 3+  Grip strength     (Blank rows = not tested)   LUMBAR ROM:   Active  Eval  Flexion Hands to mid shins  Extension 25% limited  Right lateral  flexion Hand to femoral condyle - tight  Left lateral flexion Hand to lateral knee - twinge in R low back  Right rotation WFL  Left rotation WFL  (Blank rows = not tested)  MUSCLE LENGTH: Hamstrings: mod/severe tight L>R ITB: mod/severe tight B Piriformis: mild/mod tight R>L Hip flexors: mod tight B Quads: mod tight R>L  LOWER EXTREMITY ROM:    Grossly WFL  LOWER EXTREMITY MMT:    MMT Right eval Left eval  Hip flexion 4 4  Hip extension 4- 4-  Hip abduction 4- 4-  Hip adduction 4- 3+  Hip internal rotation 5 4+  Hip external rotation 4- 3+  Knee flexion 5 4+  Knee extension 5 5  Ankle dorsiflexion 4+ 4+  Ankle plantarflexion    Ankle inversion    Ankle eversion     (Blank rows = not tested)  LUMBAR SPECIAL TESTS:  Straight leg raise test: Negative and Slump test: Negative  FUNCTIONAL TESTS: (TBA next visit) 5 times sit to stand:   Functional gait assessment:   Berg balance scale:      Scores as of D/C from PT in July 2024: Berg: 54/56 FGA: 28/30   TODAY'S TREATMENT:   08/03/2023  SELF CARE:  Reviewed eval findings and role of PT in addressing identified deficits as well as instruction in initial HEP (see below).    PATIENT EDUCATION:  Education details: PT eval findings, anticipated POC, need for further assessment of standardized balance testing, and initial HEP Person educated: Patient Education method: Explanation, Demonstration, Tactile cues, Verbal cues, Handouts, and MedBridgeGO app access provided Education comprehension: verbalized understanding, returned demonstration, verbal cues required, tactile cues required, and needs further education  HOME  EXERCISE PROGRAM: Access Code: [email or web address] URL: [email or web address] Date: 08/03/2023 Prepared by: Felecia Hopper  Exercises - Supine Hamstring Stretch with Strap  - 1-2 x daily - 7 x weekly - 3 reps - 30 sec hold - Seated Hamstring Stretch  - 1-2 x daily - 7 x weekly - 3 reps - 30 sec hold - Supine Iliotibial Band Stretch with Strap  - 1-2 x daily - 7 x weekly - 3 reps - 30 sec hold - Supine Gluteus Stretch  - 1-2 x daily - 7 x weekly - 3 reps - 30 sec hold - Supine Piriformis Stretch with Foot on Ground  - 1-2 x daily - 7 x weekly - 3 reps - 30 sec hold - Supine Lower Trunk Rotation  - 1-2 x daily - 7 x weekly - 2 sets - 5 reps - 10 sec hold - Supine Hip Adduction Isometric with Ball  - 1-2 x daily - 7 x weekly - 2 sets - 10 reps - 5 sec hold - Doorway Pec Stretch at 90 Degrees Abduction  - 1-2 x daily - 7 x weekly - 3 reps - 30 sec hold - Seated Scapular Retraction with External Rotation  - 1-2 x daily - 7 x weekly - 2 sets - 10 reps - 3-5 sec hold   ASSESSMENT:  CLINICAL IMPRESSION: Gibril Mastro is a 68 y.o. male who was referred to physical therapy for evaluation and treatment for R sided LBP 2 lumbar DDD.  Patient also c/o intermittent L UE radiculopathy (vibration sensation).  Patient reports onset of R sided LBP pain beginning ~2 months ago w/o know MOI.  Triggers for LBP and R UE radiculopathy are unpredictable.  Patient has deficits in cervical and lumbar ROM, proximal B LE flexibility, L>R UE/scapular strength, B  LE strength, core/postural weakness with abnormal posture, and abnormal muscle tension which are interfering with ADLs and are impacting quality of life.  On Modified Oswestry patient scored 18/50 demonstrating 36% or moderate disability.  On QuickDASH patient scored 29.5/100 demonstrating 29.5% disability.  He also notes impaired balance which will be formally assessed next visit.  Ayyub will benefit from skilled PT to address above deficits to improve  mobility and activity tolerance with decreased pain interference.     OBJECTIVE IMPAIRMENTS: decreased activity tolerance, decreased balance, decreased knowledge of condition, decreased mobility, difficulty walking, decreased ROM, decreased strength, increased fascial restrictions, impaired perceived functional ability, increased muscle spasms, impaired flexibility, impaired sensation, impaired UE functional use, improper body mechanics, postural dysfunction, and pain.   ACTIVITY LIMITATIONS: carrying, lifting, bending, sitting, standing, squatting, sleeping, stairs, transfers, bed mobility, reach over head, locomotion level, and caring for others  PARTICIPATION LIMITATIONS: meal prep, cleaning, laundry, community activity, and yard work  PERSONAL FACTORS: Age, Fitness, Past/current experiences, Time since onset of injury/illness/exacerbation, and 3+ comorbidities: DM-II, obesity, elevated BP, GERD, B patellofemoral pain syndrome, B hip pain, h/o R RTC tendinitis, chronic B LBP w/o sciatica, scoliosis, diabetic peripheral neuropathy are also affecting patient's functional outcome.   REHAB POTENTIAL: Good  CLINICAL DECISION MAKING: Evolving/moderate complexity  EVALUATION COMPLEXITY: Moderate   GOALS: Goals reviewed with patient? Yes  SHORT TERM GOALS: Target date: 09/07/2023  Patient will be independent with initial HEP to improve outcomes and carryover.  Baseline:  Goal status: INITIAL  2.  Patient will report 25% improvement in low back pain to improve QOL. Baseline: 0/10 on eval, up to at least 7/10  Goal status: INITIAL  3.  Patient will report 25% reduction in frequency and intensity of L UE radiculopathy to improve QOL. Baseline: 3-5/10 on eval, up to 8-9/10 Goal status: INITIAL   LONG TERM GOALS: Target date: 10/12/2023  Patient will be independent with ongoing/advanced HEP for self-management at home.  Baseline:  Goal status: INITIAL  2.  Patient will report 50-75%  improvement in low back pain to improve QOL.  Baseline: 0/10 on eval, up to at least 7/10 Goal status: INITIAL  3.  Patient will report 50-75% reduction in frequency and intensity of L UE radiculopathy to improve QOL. Baseline: 3-5/10 on eval, up to 8-9/10 Goal status: INITIAL   4.  Patient to demonstrate ability to achieve and maintain good spinal alignment/posturing and body mechanics needed for daily activities. Baseline: rounded shoulders, forward head, decreased lumbar lordosis, increased thoracic kyphosis, and flexed trunk posture Goal status: INITIAL  5.  Patient will demonstrate functional pain free cervical and lumbar ROM to perform ADLs.   Baseline: Refer to above cervical and lumbar ROM table Goal status: INITIAL  6.  Patient will demonstrate improved scapular, B shoulder and B LE  strength to >/= 4+/5 for improved stability and ease of mobility. Baseline: Refer to above UE & LE MMT tables Goal status: INITIAL  7. Patient will report </= 24% on Modified Oswestry (MCID = 12%) to demonstrate improved functional ability with decreased pain interference. Baseline: 18 / 50 = 36.0 % Goal status: INITIAL  8.  Patient will report </= 16% on QuickDASH to demonstrate improved functional ability with decreased L UE radiculopathy interference. Baseline: 29.5 / 100 = 29.5 % Goal status: INITIAL   9.  Patient will improve FGA score by at least 4 points or to >/= 28/30 (baseline as of prior D/C from PT) to improve gait stability and reduce risk  for falls.  Baseline: TBA Goal status: INITIAL    PLAN:  PT FREQUENCY: 2x/week  PT DURATION: 8-10 weeks  PLANNED INTERVENTIONS: 97164- PT Re-evaluation, 97750- Physical Performance Testing, 97110-Therapeutic exercises, 97530- Therapeutic activity, [id number]- Neuromuscular re-education, 97535- Self Care, 16109- Manual therapy, [phone]- Gait training, [phone]- Aquatic Therapy, [phone]- Electrical stimulation (unattended), [id number]- Ultrasound, [id number]-  Traction (mechanical), [id number]- Ionotophoresis 4mg /ml Dexamethasone , 29562 (1-2 muscles), 20561 (3+ muscles)- Dry Needling, Patient/Family education, Balance training, Stair training, Taping, Joint mobilization, Spinal mobilization, Cryotherapy, and Moist heat  PLAN FOR NEXT SESSION: Complete balance assessment with 5xSTS, Berg and FGA and update goals as indicated; Review initial HEP; Progress core and postural flexibility and strengthening, updating HEP as indicated; MT +/- TPDN to address abnormal muscle tension   Francisco Irving, PT 08/03/2023, 9:53 AM

## 2023-07-26 ENCOUNTER — Other Ambulatory Visit: Payer: Self-pay

## 2023-07-26 MED ORDER — FREESTYLE LIBRE 2 READER DEVI: 5 refills | Status: DC

## 2023-07-28 ENCOUNTER — Telehealth: Payer: Self-pay

## 2023-07-28 MED ORDER — FREESTYLE LIBRE 2 READER DEVI: 5 refills | Status: AC

## 2023-07-28 NOTE — Telephone Encounter (Signed)
Rx resent w/dx code

## 2023-07-28 NOTE — Telephone Encounter (Signed)
 Copied from CRM [id number]. Topic: Clinical - Medication Question >> Jul 28, 2023  9:16 AM [name] wrote: Reason for CRM: Pharmacy called asking for a Diagnosis code for the [facility], please send new prescription to [facility]

## 2023-08-01 ENCOUNTER — Encounter: Payer: Self-pay | Admitting: Family Medicine

## 2023-08-01 NOTE — Telephone Encounter (Signed)
 Appt scheduled

## 2023-08-02 ENCOUNTER — Ambulatory Visit (INDEPENDENT_AMBULATORY_CARE_PROVIDER_SITE_OTHER): Admitting: Family Medicine

## 2023-08-02 ENCOUNTER — Encounter: Payer: Self-pay | Admitting: Family Medicine

## 2023-08-02 VITALS — BP 122/80 | HR 87 | Temp 98.0°F | Resp 16 | Ht 73.0 in | Wt 257.0 lb

## 2023-08-02 DIAGNOSIS — R202 Paresthesia of skin: Secondary | ICD-10-CM

## 2023-08-02 NOTE — Patient Instructions (Addendum)
Heat (pad or rice pillow in microwave) over affected area, 10-15 minutes twice daily.   Ice/cold pack over area for 10-15 min twice daily.  OK to take Tylenol 1000 mg (2 extra strength tabs) or 975 mg (3 regular strength tabs) every 6 hours as needed.  Let us know if you need anything.  Trapezius stretches/exercises Do exercises exactly as told by your health care provider and adjust them as directed. It is normal to feel mild stretching, pulling, tightness, or discomfort as you do these exercises, but you should stop right away if you feel sudden pain or your pain gets worse.   Stretching and range of motion exercises These exercises warm up your muscles and joints and improve the movement and flexibility of your shoulder. These exercises can also help to relieve pain, numbness, and tingling. If you are unable to do any of the following for any reason, do not further attempt to do it.   Exercise A: Flexion, standing     Stand and hold a broomstick, a cane, or a similar object. Place your hands a little more than shoulder-width apart on the object. Your left / right hand should be palm-up, and your other hand should be palm-down. Push the stick to raise your left / right arm out to your side and then over your head. Use your other hand to help move the stick. Stop when you feel a stretch in your shoulder, or when you reach the angle that is recommended by your health care provider. Avoid shrugging your shoulder while you raise your arm. Keep your shoulder blade tucked down toward your spine. Hold for 30 seconds. Slowly return to the starting position. Repeat 2 times. Complete this exercise 3 times per week.  Exercise B: Abduction, supine     Lie on your back and hold a broomstick, a cane, or a similar object. Place your hands a little more than shoulder-width apart on the object. Your left / right hand should be palm-up, and your other hand should be palm-down. Push the stick to raise  your left / right arm out to your side and then over your head. Use your other hand to help move the stick. Stop when you feel a stretch in your shoulder, or when you reach the angle that is recommended by your health care provider. Avoid shrugging your shoulder while you raise your arm. Keep your shoulder blade tucked down toward your spine. Hold for 30 seconds. Slowly return to the starting position. Repeat 2 times. Complete this exercise 3 times per week.  Exercise C: Flexion, active-assisted     Lie on your back. You may bend your knees for comfort. Hold a broomstick, a cane, or a similar object. Place your hands about shoulder-width apart on the object. Your palms should face toward your feet. Raise the stick and move your arms over your head and behind your head, toward the floor. Use your healthy arm to help your left / right arm move farther. Stop when you feel a gentle stretch in your shoulder, or when you reach the angle where your health care provider tells you to stop. Hold for 30 seconds. Slowly return to the starting position. Repeat 2 times. Complete this exercise 3 times per week.  Exercise D: External rotation and abduction     Stand in a door frame with one of your feet slightly in front of the other. This is called a staggered stance. Choose one of the following positions as told   by your health care provider: Place your hands and forearms on the door frame above your head. Place your hands and forearms on the door frame at the height of your head. Place your hands on the door frame at the height of your elbows. Slowly move your weight onto your front foot until you feel a stretch across your chest and in the front of your shoulders. Keep your head and chest upright and keep your abdominal muscles tight. Hold for 30 seconds. To release the stretch, shift your weight to your back foot. Repeat 2 times. Complete this stretch 3 times per week.  Strengthening  exercises These exercises build strength and endurance in your shoulder. Endurance is the ability to use your muscles for a long time, even after your muscles get tired. Exercise E: Scapular depression and adduction  Sit on a stable chair. Support your arms in front of you with pillows, armrests, or a tabletop. Keep your elbows in line with the sides of your body. Gently move your shoulder blades down toward your middle back. Relax the muscles on the tops of your shoulders and in the back of your neck. Hold for 3 seconds. Slowly release the tension and relax your muscles completely before doing this exercise again. Repeat for a total of 10 repetitions. After you have practiced this exercise, try doing the exercise without the arm support. Then, try the exercise while standing instead of sitting. Repeat 2 times. Complete this exercise 3 times per week.  Exercise F: Shoulder abduction, isometric     Stand or sit about 4-6 inches (10-15 cm) from a wall with your left / right side facing the wall. Bend your left / right elbow and gently press your elbow against the wall. Increase the pressure slowly until you are pressing as hard as you can without shrugging your shoulder. Hold for 3 seconds. Slowly release the tension and relax your muscles completely. Repeat for a total of 10 repetitions. Repeat 2 times. Complete this exercise 3 times per week.  Exercise G: Shoulder flexion, isometric     Stand or sit about 4-6 inches (10-15 cm) away from a wall with your left / right side facing the wall. Keep your left / right elbow straight and gently press the top of your fist against the wall. Increase the pressure slowly until you are pressing as hard as you can without shrugging your shoulder. Hold for 10-15 seconds. Slowly release the tension and relax your muscles completely. Repeat for a total of 10 repetitions. Repeat 2 times. Complete this exercise 3 times per week.  Exercise H: Internal  rotation     Sit in a stable chair without armrests, or stand. Secure an exercise band at your left / right side, at elbow height. Place a soft object, such as a folded towel or a small pillow, under your left / right upper arm so your elbow is a few inches (about 8 cm) away from your side. Hold the end of the exercise band so the band stretches. Keeping your elbow pressed against the soft object under your arm, move your forearm across your body toward your abdomen. Keep your body steady so the movement is only coming from your shoulder. Hold for 3 seconds. Slowly return to the starting position. Repeat for a total of 10 repetitions. Repeat 2 times. Complete this exercise 3 times per week.  Exercise I: External rotation     Sit in a stable chair without armrests, or stand. Secure an   exercise band at your left / right side, at elbow height. Place a soft object, such as a folded towel or a small pillow, under your left / right upper arm so your elbow is a few inches (about 8 cm) away from your side. Hold the end of the exercise band so the band stretches. Keeping your elbow pressed against the soft object under your arm, move your forearm out, away from your abdomen. Keep your body steady so the movement is only coming from your shoulder. Hold for 3 seconds. Slowly return to the starting position. Repeat for a total of 10 repetitions. Repeat 2 times. Complete this exercise 3 times per week. Exercise J: Shoulder extension  Sit in a stable chair without armrests, or stand. Secure an exercise band to a stable object in front of you so the band is at shoulder height. Hold one end of the exercise band in each hand. Your palms should face each other. Straighten your elbows and lift your hands up to shoulder height. Step back, away from the secured end of the exercise band, until the band stretches. Squeeze your shoulder blades together and pull your hands down to the sides of your thighs. Stop  when your hands are straight down by your sides. Do not let your hands go behind your body. Hold for 3 seconds. Slowly return to the starting position. Repeat for a total of 10 repetitions. Repeat 2 times. Complete this exercise 3 times per week.  Exercise K: Shoulder extension, prone     Lie on your abdomen on a firm surface so your left / right arm hangs over the edge. Hold a 5 lb weight in your hand so your palm faces in toward your body. Your arm should be straight. Squeeze your shoulder blade down toward the middle of your back. Slowly raise your arm behind you, up to the height of the surface that you are lying on. Keep your arm straight. Hold for 3 seconds. Slowly return to the starting position and relax your muscles. Repeat for a total of 10 repetitions. Repeat 2 times. Complete this exercise 3 times per week.   Exercise L: Horizontal abduction, prone  Lie on your abdomen on a firm surface so your left / right arm hangs over the edge. Hold a 5 lb weight in your hand so your palm faces toward your feet. Your arm should be straight. Squeeze your shoulder blade down toward the middle of your back. Bend your elbow so your hand moves up, until your elbow is bent to an "L" shape (90 degrees). With your elbow bent, slowly move your forearm forward and up. Raise your hand up to the height of the surface that you are lying on. Your upper arm should not move, and your elbow should stay bent. At the top of the movement, your palm should face the floor. Hold for 3 seconds. Slowly return to the starting position and relax your muscles. Repeat for a total of 10 repetitions. Repeat 2 times. Complete this exercise 3 times per week.  Exercise M: Horizontal abduction, standing  Sit on a stable chair, or stand. Secure an exercise band to a stable object in front of you so the band is at shoulder height. Hold one end of the exercise band in each hand. Straighten your elbows and lift your hands  straight in front of you, up to shoulder height. Your palms should face down, toward the floor. Step back, away from the secured end of the   exercise band, until the band stretches. Move your arms out to your sides, and keep your arms straight. Hold for 3 seconds. Slowly return to the starting position. Repeat for a total of 10 repetitions. Repeat 2 times. Complete this exercise 3 times per week.  Exercise N: Scapular retraction and elevation  Sit on a stable chair, or stand. Secure an exercise band to a stable object in front of you so the band is at shoulder height. Hold one end of the exercise band in each hand. Your palms should face each other. Sit in a stable chair without armrests, or stand. Step back, away from the secured end of the exercise band, until the band stretches. Squeeze your shoulder blades together and lift your hands over your head. Keep your elbows straight. Hold for 3 seconds. Slowly return to the starting position. Repeat for a total of 10 repetitions. Repeat 2 times. Complete this exercise 3 times per week.  This information is not intended to replace advice given to you by your health care provider. Make sure you discuss any questions you have with your health care provider. Document Released: 02/15/2005 Document Revised: 10/23/2015 Document Reviewed: 01/02/2015 [employer] Interactive Patient Education  2017 [employer]  

## 2023-08-02 NOTE — Progress Notes (Signed)
 Musculoskeletal Exam  Patient: Noah Lane DOB: 1956-02-13  DOS: 08/02/2023  SUBJECTIVE:  Chief Complaint:   Chief Complaint  Patient presents with   Arm Injury    Arm    Extremity Weakness    Left Arm Tingling     Noah Lane is a 68 y.o.  male for evaluation and treatment of arm pain.   Onset:  10 days ago. No inj or change in activity.  Location: LUE from shoulder to wrist on dorsum Character:  vibration sensation  Progression of issue:  is unchanged Associated symptoms: Not exertional.  No bruising, redness, swelling, neck pain, decreased ROM.  Treatment: to date has been none.   Neurovascular symptoms: no  Past Medical History:  Diagnosis Date   Broken toe    Diabetes mellitus type 2 in obese    Mixed hyperlipidemia     Objective: VITAL SIGNS: BP 122/80 (BP Location: Left Arm, Patient Position: Sitting)   Pulse 87   Temp 98 F (36.7 C) (Oral)   Resp 16   Ht 6\' 1"  (1.854 m)   Wt 257 lb (116.6 kg)   SpO2 95%   BMI 33.91 kg/m  Constitutional: Well formed, well developed. No acute distress. Thorax & Lungs: No accessory muscle use Musculoskeletal: LUE.   Normal active range of motion: no.   Normal passive range of motion: no Tenderness to palpation: no Deformity: no Ecchymosis: no Tests positive: none Tests negative: Spurling's Neurologic: Normal sensory function. No focal deficits noted. DTR's equal and symmetric in UE's. No clonus. Grip strength adequate b/l. Psychiatric: Normal mood. Age appropriate judgment and insight. Alert & oriented x 3.    Assessment:  Paresthesias - Plan: EKG 12-Lead  Plan: Stretches/exercises, heat, ice, Tylenol . He will bring up situation w PT.  EKG shows NSR, normal axis, no interval abnormalities, no ST segment or T wave changes, good R wave progression.  F/u as originally scheduled. The patient voiced understanding and agreement to the plan.   Noah Dials Gloster, DO 08/02/23  11:50 AM

## 2023-08-03 ENCOUNTER — Other Ambulatory Visit: Payer: Self-pay

## 2023-08-03 ENCOUNTER — Encounter: Payer: Self-pay | Admitting: Physical Therapy

## 2023-08-03 ENCOUNTER — Ambulatory Visit: Attending: Sports Medicine | Admitting: Physical Therapy

## 2023-08-03 DIAGNOSIS — M6281 Muscle weakness (generalized): Secondary | ICD-10-CM | POA: Insufficient documentation

## 2023-08-03 DIAGNOSIS — M5412 Radiculopathy, cervical region: Secondary | ICD-10-CM | POA: Diagnosis present

## 2023-08-03 DIAGNOSIS — M51369 Other intervertebral disc degeneration, lumbar region without mention of lumbar back pain or lower extremity pain: Secondary | ICD-10-CM | POA: Diagnosis not present

## 2023-08-03 DIAGNOSIS — R293 Abnormal posture: Secondary | ICD-10-CM | POA: Diagnosis present

## 2023-08-03 DIAGNOSIS — M5459 Other low back pain: Secondary | ICD-10-CM | POA: Diagnosis present

## 2023-08-03 DIAGNOSIS — R262 Difficulty in walking, not elsewhere classified: Secondary | ICD-10-CM | POA: Insufficient documentation

## 2023-08-03 DIAGNOSIS — R2681 Unsteadiness on feet: Secondary | ICD-10-CM | POA: Insufficient documentation

## 2023-08-05 ENCOUNTER — Encounter: Payer: Self-pay | Admitting: Podiatry

## 2023-08-05 ENCOUNTER — Ambulatory Visit (INDEPENDENT_AMBULATORY_CARE_PROVIDER_SITE_OTHER): Admitting: Podiatry

## 2023-08-05 DIAGNOSIS — E1142 Type 2 diabetes mellitus with diabetic polyneuropathy: Secondary | ICD-10-CM | POA: Diagnosis not present

## 2023-08-05 DIAGNOSIS — M79674 Pain in right toe(s): Secondary | ICD-10-CM | POA: Diagnosis not present

## 2023-08-05 DIAGNOSIS — M79675 Pain in left toe(s): Secondary | ICD-10-CM

## 2023-08-05 DIAGNOSIS — B351 Tinea unguium: Secondary | ICD-10-CM | POA: Diagnosis not present

## 2023-08-05 MED ORDER — CICLOPIROX 8 % EX SOLN: Freq: Every day | CUTANEOUS | 0 refills | Status: AC

## 2023-08-05 NOTE — Progress Notes (Signed)
  Subjective:  Patient ID: Noah Lane, male    DOB: 09/01/1955,   MRN: [id number]  Chief Complaint  Patient presents with   Diabetes    "Check my feet and discuss the condition of my nails."  Dr. Dawna Etienne - saw 05/17/2023; A1c - 7.1    68 y.o. male presents for concern of thickened elongated and painful nails that are difficult to trim. Requesting to have them trimmed today. Relates burning and tingling in their feet. Patient is diabetic and last A1c was  Lab Results  Component Value Date   HGBA1C 7.1 (H) 05/17/2023   .   PCP:  Jobe Mulder, DO      . Denies any other pedal complaints. Denies n/v/f/c.   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 CFT <3 seconds. Absent hair growth on digits. Edema noted to bilateral lower extremities. Xerosis noted bilaterally.  Skin. No lacerations or abrasions bilateral feet. Nails 1-5 bilateral  are thickened discolored and elongated with subungual debris.  Musculoskeletal: MMT 5/5 bilateral lower extremities in DF, PF, Inversion and Eversion. Deceased ROM in DF of ankle joint. No pain to palpation about the toe.  Neurological: Sensation intact to light touch. Protective sensation diminished bilateral.    Assessment:   1. Pain due to onychomycosis of toenails of both feet   2. Type 2 diabetes mellitus with peripheral neuropathy (HCC)        Plan:  Patient was evaluated and treated and all questions answered. -Discussed and educated patient on diabetic foot care, especially with  regards to the vascular, neurological and musculoskeletal systems.  -Stressed the importance of good glycemic control and the detriment of not  controlling glucose levels in relation to the foot. -Discussed supportive shoes at all times and checking feet regularly.  -Mechanically debrided all nails  1-5 bilateral using sterile nail nipper and filed with dremel without incident  -Penlac sent to pharmacy at patient request.  -Answered all patient questions -Patient to return  in 3 months for at risk foot care -Patient advised to call the office if any problems or questions arise in the meantime.     Jennefer Moats, DPM

## 2023-08-08 ENCOUNTER — Encounter: Payer: Self-pay | Admitting: Physical Therapy

## 2023-08-08 ENCOUNTER — Ambulatory Visit: Payer: Self-pay | Admitting: Physical Therapy

## 2023-08-08 DIAGNOSIS — R293 Abnormal posture: Secondary | ICD-10-CM

## 2023-08-08 DIAGNOSIS — M5412 Radiculopathy, cervical region: Secondary | ICD-10-CM

## 2023-08-08 DIAGNOSIS — R2681 Unsteadiness on feet: Secondary | ICD-10-CM

## 2023-08-08 DIAGNOSIS — M6281 Muscle weakness (generalized): Secondary | ICD-10-CM

## 2023-08-08 DIAGNOSIS — M5459 Other low back pain: Secondary | ICD-10-CM

## 2023-08-08 NOTE — Therapy (Signed)
 OUTPATIENT PHYSICAL THERAPY TREATMENT   Patient Name: Noah Lane MRN: [id number] DOB:February 18, 1956, 68 y.o., male Today's Date: 08/08/2023  END OF SESSION:  PT End of Session - 08/08/23 1314     Visit Number 2    Date for PT Re-Evaluation 10/12/23    Authorization Type Medicare & BCBS    Progress Note Due on Visit 10    PT Start Time 1314    PT Stop Time 1409    PT Time Calculation (min) 55 min    Activity Tolerance Patient tolerated treatment well    Behavior During Therapy WFL for tasks assessed/performed              Past Medical History:  Diagnosis Date   Broken toe    Diabetes mellitus type 2 in obese    Mixed hyperlipidemia    Past Surgical History:  Procedure Laterality Date   COLON RESECTION N/A 04/27/2022   Procedure: LAPAROSCOPIC HAND ASSISTED RIGHT HEMI COLECTOMY, POSSIBLE OPEN;  Surgeon: Lujean Sake, MD;  Location: [facility];  Service: General;  Laterality: N/A;   COLONOSCOPY     HERNIA REPAIR  2012   Patient Active Problem List   Diagnosis Date Noted   Elevated blood pressure reading 05/04/2022   Colon adenocarcinoma (HCC) 05/04/2022   Gastroesophageal reflux disease 04/29/2022   Ileus, postoperative (HCC) 04/29/2022   Hypokalemia 04/28/2022   Hyperlipidemia 04/28/2022   Colonic obstruction (HCC) 04/25/2022   Hyponatremia 04/25/2022   OSA on CPAP 12/03/2021   Obesity (BMI 30.0-34.9) 12/03/2021   Mixed hyperlipidemia 10/20/2021   Strain of lumbar region 10/16/2021   Strain of calf muscle 07/22/2021   Patellofemoral pain syndrome of both knees 02/24/2021   Rotator cuff tendinitis, right 12/28/2019   Hip pain, bilateral 05/28/2019   Chronic bilateral low back pain without sciatica 05/28/2019   Type 2 diabetes mellitus with obesity (HCC) 05/28/2019   Erectile dysfunction 05/28/2019   Pain and swelling of left knee 03/12/2019   Lateral epicondylitis, right elbow 03/15/2017   Diverticulosis of large intestine without hemorrhage 02/25/2014     PCP: Jobe Mulder, DO   REFERRING PROVIDER: Frazier Jacob, DO   REFERRING DIAG: [phone] (ICD-10-CM) - Degeneration of intervertebral disc of lumbar region, unspecified whether pain present   THERAPY DIAG:  Other low back pain  Radiculopathy, cervical region  Muscle weakness (generalized)  Abnormal posture  Unsteadiness on feet  RATIONALE FOR EVALUATION AND TREATMENT: Rehabilitation  ONSET DATE: early April 2025  NEXT MD VISIT: None scheduled with referring provider, 08/19/23 with PCP   SUBJECTIVE:  SUBJECTIVE STATEMENT: Pt reports he has not been able do anything since our initial visit as he ended up with an extended bout of vertigo.  No LBP today but still having the intermittent UE pain.  EVAL:  Pt reports he started getting LBP, mostly on R side, ~2 months ago w/o known MOI.  Pain first noted while rolling over in bed.  Pain comes in "strikes" w/o predictable trigger.  Pain is momentary lasting a few minutes when present.  He also reports recent onset of L UE numbness and tingling that he says Dr. Gwenette Lennox recommended he mention to PT.  UE radiculopathy also unpredictable in trigger and varies in duration.  He notes constant stiffness in neck, but denies pain.  He also reports balance issues, esp initially on standing, which he attributes to a weak core and his neuropathy.  PAIN: Are you having pain? Yes: NPRS scale: 0/10   Pain location: R low back  Pain description: sharp, short-lived  Aggravating factors: unpredictable  Relieving factors: remain still until pain resolves   Are you having pain? Yes: NPRS scale: "comes and goes at all types of intensities"  Pain location: L UE down to radial side of hand, occasional in thumb  Pain description: vibration  (reminds him of a TENS unit), stiffness in neck  Aggravating factors: unpredictable  Relieving factors: heating pads, stretches   PERTINENT HISTORY:  DM-II, obesity, elevated BP, GERD, B patellofemoral pain syndrome, B hip pain, h/o R RTC tendinitis, chronic B LBP w/o sciatica, scoliosis, diabetic peripheral neuropathy  PRECAUTIONS: None  RED FLAGS: None  WEIGHT BEARING RESTRICTIONS: No  FALLS:  Has patient fallen in last 6 months? 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 I would like the shoulder thing to go away. Better balance."   OBJECTIVE: (objective measures completed at initial evaluation unless otherwise dated)  DIAGNOSTIC FINDINGS:  10/16/21 - DG lumbar spine IMPRESSION: No acute fracture or subluxation of the lumbar spine.  PATIENT SURVEYS:  Modified Oswestry 18 / 50 = 36.0 %, moderate disability   Quick Dash 29.5 / 100 = 29.5 %  COGNITION:  Overall cognitive status: History of cognitive impairments - at baseline (mostly STM memory recall)    SENSATION: WFL B peripheral neuropathy in feet  POSTURE:  rounded shoulders, forward head, decreased lumbar lordosis, increased thoracic kyphosis, and flexed trunk   PALPATION: Increased muscle tension in L>R UT and LS as well as R>L lumbar paraspinals and glutes/piriformis  CERVICAL ROM:   Active ROM Eval  Flexion 49  Extension 42  Right lateral flexion 24  Left lateral flexion 16  Right rotation 51  Left  rotation 52   (Blank rows = not tested)  UPPER EXTREMITY MMT:  MMT Right eval Left eval  Shoulder flexion 4+ 4-  Shoulder extension 4+ 4+  Shoulder abduction 4+ 4  Shoulder adduction    Shoulder internal rotation 4+ 4+  Shoulder external rotation 4 4  Middle trapezius 4- 4-  Lower trapezius 3+  3+  Grip strength     (Blank rows = not tested)   LUMBAR ROM:   Active  Eval  Flexion Hands to mid shins  Extension 25% limited  Right lateral flexion Hand to femoral condyle - tight  Left lateral flexion Hand to lateral knee - twinge in R low back  Right rotation WFL  Left rotation WFL  (Blank rows = not tested)  MUSCLE LENGTH: Hamstrings: mod/severe tight L>R ITB: mod/severe tight B Piriformis: mild/mod tight R>L Hip flexors: mod tight B Quads: mod tight R>L  LOWER EXTREMITY ROM:    Grossly WFL  LOWER EXTREMITY MMT:    MMT Right eval Left eval  Hip flexion 4 4  Hip extension 4- 4-  Hip abduction 4- 4-  Hip adduction 4- 3+  Hip internal rotation 5 4+  Hip external rotation 4- 3+  Knee flexion 5 4+  Knee extension 5 5  Ankle dorsiflexion 4+ 4+  Ankle plantarflexion    Ankle inversion    Ankle eversion     (Blank rows = not tested)  LUMBAR SPECIAL TESTS:  Straight leg raise test: Negative and Slump test: Negative  FUNCTIONAL TESTS: (TBA next visit) 5 times sit to stand:   Functional gait assessment:   Berg balance scale:      Scores as of D/C from PT in July 2024: Berg: 54/56 FGA: 28/30   TODAY'S TREATMENT:   08/08/2023  THERAPEUTIC EXERCISE: To improve strength, endurance, ROM, and flexibility.  Demonstration, verbal and tactile cues throughout for technique.  Rec Bike - L2 x 6 min Doorway pec stretch at 90 ABD 2 x 30"  Seated scapular retraction +ER 10 x 3-5" Supine HS stretch with strap 3 x 30" bil Supine cross-body ITB stretch with strap 3 x 30" bil Supine gluteus stretch 3 x 30" bil Hooklying KTOS piriformis stretch 3 x 30" bil Supine LTR 5 x  10" Hooklying hip ADD isometric with ball 10 x 5"  NEUROMUSCULAR RE-EDUCATION: To improve coordination, kinesthesia, and posture.  Hooklying TrA + GTB shoulder horiz ABD 10 x 5" Hooklying TrA + GTB shoulder ER 10 x 5" Hooklying TrA + alt GTB hip ABD/ER bent knee fallout 10 x 5"   08/03/2023  SELF CARE:  Reviewed eval findings and role of PT in addressing identified deficits as well as instruction in initial HEP (see below).    PATIENT EDUCATION:  Education details: HEP review and continue with current HEP Person educated: Patient Education method: Explanation, Demonstration, and Verbal cues Education comprehension: verbalized understanding, returned demonstration, verbal cues required, and needs further education  HOME EXERCISE PROGRAM: Access Code: [email or web address] URL: [email or web address] Date: 08/03/2023 Prepared by: Felecia Hopper  Exercises - Supine Hamstring Stretch with Strap  - 1-2 x daily - 7 x weekly - 3 reps - 30 sec hold - Seated Hamstring Stretch  - 1-2 x daily - 7 x weekly - 3 reps - 30 sec hold - Supine Iliotibial Band Stretch with Strap  - 1-2 x daily - 7 x weekly - 3 reps - 30 sec hold - Supine Gluteus Stretch  - 1-2 x daily - 7 x weekly - 3 reps - 30 sec hold - Supine Piriformis Stretch with Foot on Ground  - 1-2 x daily - 7 x weekly - 3 reps - 30 sec hold - Supine Lower Trunk Rotation  - 1-2 x daily - 7 x weekly - 2 sets - 5 reps - 10 sec hold - Supine Hip Adduction Isometric with  Ball  - 1-2 x daily - 7 x weekly - 2 sets - 10 reps - 5 sec hold - Doorway Pec Stretch at 90 Degrees Abduction  - 1-2 x daily - 7 x weekly - 3 reps - 30 sec hold - Seated Scapular Retraction with External Rotation  - 1-2 x daily - 7 x weekly - 2 sets - 10 reps - 3-5 sec hold   ASSESSMENT:  CLINICAL IMPRESSION: Thatcher reports he was not able to attempt the HEP since the eval due to an episode of vertigo, therefore reviewed the initial HEP providing minor clarifications of  positioning and hold times.  Progressed core/postural strengthening with good tolerance but deferred HEP update until he has a chance to attempt the HEP on his own at home.  Alson will benefit from continued skilled PT to address ongoing ROM and strength deficits to improve mobility and activity tolerance with decreased pain interference.   EVAL: Haddon Fyfe is a 68 y.o. male who was referred to physical therapy for evaluation and treatment for R sided LBP 2 lumbar DDD.  Patient also c/o intermittent L UE radiculopathy (vibration sensation).  Patient reports onset of R sided LBP pain beginning ~2 months ago w/o know MOI.  Triggers for LBP and R UE radiculopathy are unpredictable.  Patient has deficits in cervical and lumbar ROM, proximal B LE flexibility, L>R UE/scapular strength, B LE strength, core/postural weakness with abnormal posture, and abnormal muscle tension which are interfering with ADLs and are impacting quality of life.  On Modified Oswestry patient scored 18/50 demonstrating 36% or moderate disability.  On QuickDASH patient scored 29.5/100 demonstrating 29.5% disability.  He also notes impaired balance which will be formally assessed next visit.  Romulo will benefit from skilled PT to address above deficits to improve mobility and activity tolerance with decreased pain interference.     OBJECTIVE IMPAIRMENTS: decreased activity tolerance, decreased balance, decreased knowledge of condition, decreased mobility, difficulty walking, decreased ROM, decreased strength, increased fascial restrictions, impaired perceived functional ability, increased muscle spasms, impaired flexibility, impaired sensation, impaired UE functional use, improper body mechanics, postural dysfunction, and pain.   ACTIVITY LIMITATIONS: carrying, lifting, bending, sitting, standing, squatting, sleeping, stairs, transfers, bed mobility, reach over head, locomotion level, and caring for others  PARTICIPATION  LIMITATIONS: meal prep, cleaning, laundry, community activity, and yard work  PERSONAL FACTORS: Age, Fitness, Past/current experiences, Time since onset of injury/illness/exacerbation, and 3+ comorbidities: DM-II, obesity, elevated BP, GERD, B patellofemoral pain syndrome, B hip pain, h/o R RTC tendinitis, chronic B LBP w/o sciatica, scoliosis, diabetic peripheral neuropathy are also affecting patient's functional outcome.   REHAB POTENTIAL: Good  CLINICAL DECISION MAKING: Evolving/moderate complexity  EVALUATION COMPLEXITY: Moderate   GOALS: Goals reviewed with patient? Yes  SHORT TERM GOALS: Target date: 09/07/2023  Patient will be independent with initial HEP to improve outcomes and carryover.  Baseline:  Goal status: IN PROGRESS - 08/08/23 - reviewed today  2.  Patient will report 25% improvement in low back pain to improve QOL. Baseline: 0/10 on eval, up to at least 7/10  Goal status: INITIAL  3.  Patient will report 25% reduction in frequency and intensity of L UE radiculopathy to improve QOL. Baseline: 3-5/10 on eval, up to 8-9/10 Goal status: INITIAL   LONG TERM GOALS: Target date: 10/12/2023  Patient will be independent with ongoing/advanced HEP for self-management at home.  Baseline:  Goal status: INITIAL  2.  Patient will report 50-75% improvement in low back pain to improve QOL.  Baseline: 0/10 on eval, up to at least 7/10 Goal status: INITIAL  3.  Patient will report 50-75% reduction in frequency and intensity of L UE radiculopathy to improve QOL. Baseline: 3-5/10 on eval, up to 8-9/10 Goal status: INITIAL   4.  Patient to demonstrate ability to achieve and maintain good spinal alignment/posturing and body mechanics needed for daily activities. Baseline: rounded shoulders, forward head, decreased lumbar lordosis, increased thoracic kyphosis, and flexed trunk posture Goal status: INITIAL  5.  Patient will demonstrate functional pain free cervical and lumbar ROM to  perform ADLs.   Baseline: Refer to above cervical and lumbar ROM table Goal status: INITIAL  6.  Patient will demonstrate improved scapular, B shoulder and B LE  strength to >/= 4+/5 for improved stability and ease of mobility. Baseline: Refer to above UE & LE MMT tables Goal status: INITIAL  7. Patient will report </= 24% on Modified Oswestry (MCID = 12%) to demonstrate improved functional ability with decreased pain interference. Baseline: 18 / 50 = 36.0 % Goal status: INITIAL  8.  Patient will report </= 16% on QuickDASH to demonstrate improved functional ability with decreased L UE radiculopathy interference. Baseline: 29.5 / 100 = 29.5 % Goal status: INITIAL   9.  Patient will improve FGA score by at least 4 points or to >/= 28/30 (baseline as of prior D/C from PT) to improve gait stability and reduce risk for falls.  Baseline: TBA Goal status: INITIAL    PLAN:  PT FREQUENCY: 2x/week  PT DURATION: 8-10 weeks  PLANNED INTERVENTIONS: 97164- PT Re-evaluation, 97750- Physical Performance Testing, 97110-Therapeutic exercises, 97530- Therapeutic activity, [id number]- Neuromuscular re-education, 97535- Self Care, 29562- Manual therapy, [phone]- Gait training, [phone]- Aquatic Therapy, [phone]- Electrical stimulation (unattended), [phone]- Ultrasound, [id number]- Traction (mechanical), [id number]- Ionotophoresis 4mg /ml Dexamethasone , 24401 (1-2 muscles), [id number] (3+ muscles)- Dry Needling, Patient/Family education, Balance training, Stair training, Taping, Joint mobilization, Spinal mobilization, Cryotherapy, and Moist heat  PLAN FOR NEXT SESSION: Complete balance assessment with 5xSTS, Berg and FGA and update goals as indicated; Progress core and postural flexibility and strengthening, updating HEP as indicated; MT +/- TPDN to address abnormal muscle tension   Francisco Irving, PT 08/08/2023, 2:27 PM

## 2023-08-10 ENCOUNTER — Ambulatory Visit: Admitting: Physical Therapy

## 2023-08-10 ENCOUNTER — Encounter: Payer: Self-pay | Admitting: Physical Therapy

## 2023-08-10 DIAGNOSIS — M5459 Other low back pain: Secondary | ICD-10-CM | POA: Diagnosis not present

## 2023-08-10 DIAGNOSIS — R293 Abnormal posture: Secondary | ICD-10-CM

## 2023-08-10 DIAGNOSIS — R2681 Unsteadiness on feet: Secondary | ICD-10-CM

## 2023-08-10 DIAGNOSIS — M6281 Muscle weakness (generalized): Secondary | ICD-10-CM

## 2023-08-10 DIAGNOSIS — M5412 Radiculopathy, cervical region: Secondary | ICD-10-CM

## 2023-08-10 NOTE — Therapy (Signed)
 OUTPATIENT PHYSICAL THERAPY TREATMENT   Patient Name: Noah Lane MRN: [id number] DOB:05/17/1955, 68 y.o., male Today's Date: 08/10/2023  END OF SESSION:  PT End of Session - 08/10/23 0803     Visit Number 3    Date for PT Re-Evaluation 10/12/23    Authorization Type Medicare & BCBS    Progress Note Due on Visit 10    PT Start Time 0803    PT Stop Time 0850    PT Time Calculation (min) 47 min    Activity Tolerance Patient tolerated treatment well    Behavior During Therapy WFL for tasks assessed/performed               Past Medical History:  Diagnosis Date   Broken toe    Diabetes mellitus type 2 in obese    Mixed hyperlipidemia    Past Surgical History:  Procedure Laterality Date   COLON RESECTION N/A 04/27/2022   Procedure: LAPAROSCOPIC HAND ASSISTED RIGHT HEMI COLECTOMY, POSSIBLE OPEN;  Surgeon: Lujean Sake, MD;  Location: [facility];  Service: General;  Laterality: N/A;   COLONOSCOPY     HERNIA REPAIR  2012   Patient Active Problem List   Diagnosis Date Noted   Elevated blood pressure reading 05/04/2022   Colon adenocarcinoma (HCC) 05/04/2022   Gastroesophageal reflux disease 04/29/2022   Ileus, postoperative (HCC) 04/29/2022   Hypokalemia 04/28/2022   Hyperlipidemia 04/28/2022   Colonic obstruction (HCC) 04/25/2022   Hyponatremia 04/25/2022   OSA on CPAP 12/03/2021   Obesity (BMI 30.0-34.9) 12/03/2021   Mixed hyperlipidemia 10/20/2021   Strain of lumbar region 10/16/2021   Strain of calf muscle 07/22/2021   Patellofemoral pain syndrome of both knees 02/24/2021   Rotator cuff tendinitis, right 12/28/2019   Hip pain, bilateral 05/28/2019   Chronic bilateral low back pain without sciatica 05/28/2019   Type 2 diabetes mellitus with obesity (HCC) 05/28/2019   Erectile dysfunction 05/28/2019   Pain and swelling of left knee 03/12/2019   Lateral epicondylitis, right elbow 03/15/2017   Diverticulosis of large intestine without hemorrhage 02/25/2014     PCP: Jobe Mulder, DO   REFERRING PROVIDER: Frazier Jacob, DO   REFERRING DIAG: [phone] (ICD-10-CM) - Degeneration of intervertebral disc of lumbar region, unspecified whether pain present   THERAPY DIAG:  Other low back pain  Radiculopathy, cervical region  Muscle weakness (generalized)  Abnormal posture  Unsteadiness on feet  RATIONALE FOR EVALUATION AND TREATMENT: Rehabilitation  ONSET DATE: early April 2025  NEXT MD VISIT: None scheduled with referring provider, 08/19/23 with PCP   SUBJECTIVE:  SUBJECTIVE STATEMENT: Pt reports he has had some twinges in his low back/buttock since starting the HEP stretches/exercises.  EVAL:  Pt reports he started getting LBP, mostly on R side, ~2 months ago w/o known MOI.  Pain first noted while rolling over in bed.  Pain comes in strikes w/o predictable trigger.  Pain is momentary lasting a few minutes when present.  He also reports recent onset of L UE numbness and tingling that he says Dr. Gwenette Lennox recommended he mention to PT.  UE radiculopathy also unpredictable in trigger and varies in duration.  He notes constant stiffness in neck, but denies pain.  He also reports balance issues, esp initially on standing, which he attributes to a weak core and his neuropathy.  PAIN: Are you having pain? Yes: NPRS scale: 0/10   Pain location: R low back  Pain description: sharp, short-lived  Aggravating factors: unpredictable  Relieving factors: remain still until pain resolves   Are you having pain? Yes: NPRS scale: comes and goes at all types of intensities  Pain location: L UE down to radial side of hand, occasional in thumb  Pain description: vibration (reminds him of a TENS unit), stiffness in neck  Aggravating factors:  unpredictable  Relieving factors: heating pads, stretches   PERTINENT HISTORY:  DM-II, obesity, elevated BP, GERD, B patellofemoral pain syndrome, B hip pain, h/o R RTC tendinitis, chronic B LBP w/o sciatica, scoliosis, diabetic peripheral neuropathy  PRECAUTIONS: None  RED FLAGS: None  WEIGHT BEARING RESTRICTIONS: No  FALLS:  Has patient fallen in last 6 months? 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 I would like the shoulder thing to go away. Better balance.   OBJECTIVE: (objective measures completed at initial evaluation unless otherwise dated)  DIAGNOSTIC FINDINGS:  10/16/21 - DG lumbar spine IMPRESSION: No acute fracture or subluxation of the lumbar spine.  PATIENT SURVEYS:  Modified Oswestry 18 / 50 = 36.0 %, moderate disability   Quick Dash 29.5 / 100 = 29.5 %  COGNITION:  Overall cognitive status: History of cognitive impairments - at baseline (mostly STM memory recall)    SENSATION: WFL B peripheral neuropathy in feet  POSTURE:  rounded shoulders, forward head, decreased lumbar lordosis, increased thoracic kyphosis, and flexed trunk   PALPATION: Increased muscle tension in L>R UT and LS as well as R>L lumbar paraspinals and glutes/piriformis  CERVICAL ROM:   Active ROM Eval  Flexion 49  Extension 42  Right lateral flexion 24  Left lateral flexion 16  Right rotation 51  Left rotation 52   (Blank rows = not tested)  UPPER EXTREMITY MMT:  MMT Right  eval Left eval  Shoulder flexion 4+ 4-  Shoulder extension 4+ 4+  Shoulder abduction 4+ 4  Shoulder adduction    Shoulder internal rotation 4+ 4+  Shoulder external rotation 4 4  Middle trapezius 4- 4-  Lower trapezius 3+ 3+  Grip strength     (Blank rows = not tested)   LUMBAR ROM:  Active  Eval  Flexion Hands to mid shins  Extension 25% limited  Right lateral flexion Hand to femoral condyle - tight  Left lateral flexion Hand to lateral knee - twinge in R low back  Right rotation WFL  Left rotation WFL  (Blank rows = not tested)  MUSCLE LENGTH: Hamstrings: mod/severe tight L>R ITB: mod/severe tight B Piriformis: mild/mod tight R>L Hip flexors: mod tight B Quads: mod tight R>L  LOWER EXTREMITY ROM:    Grossly WFL  LOWER EXTREMITY MMT:    MMT Right eval Left eval  Hip flexion 4 4  Hip extension 4- 4-  Hip abduction 4- 4-  Hip adduction 4- 3+  Hip internal rotation 5 4+  Hip external rotation 4- 3+  Knee flexion 5 4+  Knee extension 5 5  Ankle dorsiflexion 4+ 4+  Ankle plantarflexion    Ankle inversion    Ankle eversion     (Blank rows = not tested)  LUMBAR SPECIAL TESTS:  Straight leg raise test: Negative and Slump test: Negative  FUNCTIONAL TESTS: (08/10/23) 5 times sit to stand: 15.75 sec Functional gait assessment: 22/30; indicated medium risk for falls Berg balance scale: 50/56; indicates moderate (>50%) risk for falls    Scores as of D/C from PT in July 2024: Berg: 54/56 FGA: 28/30   TODAY'S TREATMENT:   08/10/2023 THERAPEUTIC EXERCISE: To improve strength and endurance.  Demonstration, verbal and tactile cues throughout for technique.  NuStep - L4 x 6 min - UE/LE  PHYSICAL PERFORMANCE TEST or MEASUREMENT: 5xSTS = 15.75 sec; >15 sec indicates risk for recurrent falls Berg = 50/56; indicates moderate (>50%) risk for falls FGA = 22/30; indicates medium risk for falls  Berg Balance Test   Sit to Stand Able to stand without using hands and  stabilize independently    Standing Unsupported Able to stand safely 2 minutes    Sitting with Back Unsupported but Feet Supported on Floor or Stool Able to sit safely and securely 2 minutes    Stand to Sit Sits safely with minimal use of hands    Transfers Able to transfer safely, minor use of hands    Standing Unsupported with Eyes Closed Able to stand 10 seconds with supervision    Standing Unsupported with Feet Together Able to place feet together independently and stand 1 minute safely    From Standing, Reach Forward with Outstretched Arm Can reach forward >12 cm safely (5)    From Standing Position, Pick up Object from Floor Able to pick up shoe safely and easily    From Standing Position, Turn to Look Behind Over each Shoulder Looks behind from both sides and weight shifts well    Turn 360 Degrees Able to turn 360 degrees safely in 4 seconds or less    Standing Unsupported, Alternately Place Feet on Step/Stool Able to stand independently and safely and complete 8 steps in 20 seconds    Standing Unsupported, One Foot in Front Able to plae foot ahead of the other independently and hold 30 seconds    Standing on One Leg Tries to lift leg/unable to hold 3 seconds but remains standing independently    Total Score 50    Berg comment: 46-51 moderate (>50%)      Functional Gait  Assessment   Gait Level Surface Walks 20 ft in less than 7 sec but greater than 5.5 sec, uses assistive device, slower speed, mild gait deviations, or deviates 6-10 in outside of the 12 in walkway  width.    Change in Gait Speed Able to smoothly change walking speed without loss of balance or gait deviation. Deviate no more than 6 in outside of the 12 in walkway width.    Gait with Horizontal Head Turns Performs head turns smoothly with no change in gait. Deviates no more than 6 in outside 12 in walkway width    Gait with Vertical Head Turns Performs head turns with no change in gait. Deviates no more than 6 in outside 12  in walkway width.    Gait and Pivot Turn Pivot turns safely within 3 sec and stops quickly with no loss of balance.    Step Over Obstacle Is able to step over one shoe box (4.5 in total height) without changing gait speed. No evidence of imbalance.    Gait with Narrow Base of Support Ambulates 4-7 steps.    Gait with Eyes Closed Walks 20 ft, slow speed, abnormal gait pattern, evidence for imbalance, deviates 10-15 in outside 12 in walkway width. Requires more than 9 sec to ambulate 20 ft.    Ambulating Backwards Walks 20 ft, slow speed, abnormal gait pattern, evidence for imbalance, deviates 10-15 in outside 12 in walkway width.    Steps Alternating feet, no rail.    Total Score 22    FGA comment: 19-24 = medium risk fall      NEUROMUSCULAR RE-EDUCATION: To improve coordination, kinesthesia, and posture.  Hooklying TrA + GTB shoulder horiz ABD 10 x 5 Hooklying TrA + alt GTB hip ABD/ER bent knee fallout 10 x 5   08/08/2023  THERAPEUTIC EXERCISE: To improve strength, endurance, ROM, and flexibility.  Demonstration, verbal and tactile cues throughout for technique.  Rec Bike - L2 x 6 min Doorway pec stretch at 90 ABD 2 x 30  Seated scapular retraction +ER 10 x 3-5 Supine HS stretch with strap 3 x 30 bil Supine cross-body ITB stretch with strap 3 x 30 bil Supine gluteus stretch 3 x 30 bil Hooklying KTOS piriformis stretch 3 x 30 bil Supine LTR 5 x 10 Hooklying hip ADD isometric with ball 10 x 5  NEUROMUSCULAR RE-EDUCATION: To improve coordination, kinesthesia, and posture.  Hooklying TrA + GTB shoulder horiz ABD 10 x 5 Hooklying TrA + GTB shoulder ER 10 x 5 Hooklying TrA + alt GTB hip ABD/ER bent knee fallout 10 x 5   08/03/2023  SELF CARE:  Reviewed eval findings and role of PT in addressing identified deficits as well as instruction in initial HEP (see below).    PATIENT EDUCATION:  Education details: HEP review and continue with current HEP Person educated:  Patient Education method: Explanation, Demonstration, and Verbal cues Education comprehension: verbalized understanding, returned demonstration, verbal cues required, and needs further education  HOME EXERCISE PROGRAM: Access Code: [email or web address] URL: [email or web address] Date: 08/10/2023 Prepared by: Felecia Hopper  Exercises - Supine Hamstring Stretch with Strap  - 1-2 x daily - 7 x weekly - 3 reps - 30 sec hold - Supine Iliotibial Band Stretch with Strap  - 1-2 x daily - 7 x weekly - 3 reps - 30 sec hold - Supine Gluteus Stretch  - 1-2 x daily - 7 x weekly - 3 reps - 30 sec hold - Supine Piriformis Stretch with Foot on Ground  - 1-2 x daily - 7 x weekly - 3 reps - 30 sec hold - Supine Lower Trunk Rotation  - 1-2 x daily - 7 x weekly - 2 sets - 5 reps - 10  sec hold - Supine Hip Adduction Isometric with Ball  - 1-2 x daily - 7 x weekly - 2 sets - 10 reps - 5 sec hold - Doorway Pec Stretch at 90 Degrees Abduction  - 1-2 x daily - 7 x weekly - 3 reps - 30 sec hold - Seated Scapular Retraction with External Rotation  - 1-2 x daily - 7 x weekly - 2 sets - 10 reps - 3-5 sec hold - Hooklying Single Leg Bent Knee Fallouts with Resistance  - 1 x daily - 3-4 x weekly - 2 sets - 10 reps - 3 sec hold - Supine Shoulder Horizontal Abduction with Resistance  - 1 x daily - 3-4 x weekly - 2 sets - 10 reps - 3 sec hold   ASSESSMENT:  CLINICAL IMPRESSION: Completed standardized balance testing with 5xSTS at threshold of risk for recurrent falls.  Berg and FGA indicating moderate/medium fall risk.  Boyce denied need for further review of initial HEP exercises but did want to review postural/core strengthening exercises introduced last visit but not yet added to HEP.  Provided clarification of movement patterns working towards maintaining neutral spine and core muscle engagement.  Updated HEP handouts provided at patient request.  Tynan will benefit from continued skilled PT to address ongoing ROM  and strength deficits to improve mobility and activity tolerance with decreased pain interference.   EVAL: Avyaan Summer is a 68 y.o. male who was referred to physical therapy for evaluation and treatment for R sided LBP 2 lumbar DDD.  Patient also c/o intermittent L UE radiculopathy (vibration sensation).  Patient reports onset of R sided LBP pain beginning ~2 months ago w/o know MOI.  Triggers for LBP and R UE radiculopathy are unpredictable.  Patient has deficits in cervical and lumbar ROM, proximal B LE flexibility, L>R UE/scapular strength, B LE strength, core/postural weakness with abnormal posture, and abnormal muscle tension which are interfering with ADLs and are impacting quality of life.  On Modified Oswestry patient scored 18/50 demonstrating 36% or moderate disability.  On QuickDASH patient scored 29.5/100 demonstrating 29.5% disability.  He also notes impaired balance which will be formally assessed next visit.  Beckett will benefit from skilled PT to address above deficits to improve mobility and activity tolerance with decreased pain interference.     OBJECTIVE IMPAIRMENTS: decreased activity tolerance, decreased balance, decreased knowledge of condition, decreased mobility, difficulty walking, decreased ROM, decreased strength, increased fascial restrictions, impaired perceived functional ability, increased muscle spasms, impaired flexibility, impaired sensation, impaired UE functional use, improper body mechanics, postural dysfunction, and pain.   ACTIVITY LIMITATIONS: carrying, lifting, bending, sitting, standing, squatting, sleeping, stairs, transfers, bed mobility, reach over head, locomotion level, and caring for others  PARTICIPATION LIMITATIONS: meal prep, cleaning, laundry, community activity, and yard work  PERSONAL FACTORS: Age, Fitness, Past/current experiences, Time since onset of injury/illness/exacerbation, and 3+ comorbidities: DM-II, obesity, elevated BP, GERD, B  patellofemoral pain syndrome, B hip pain, h/o R RTC tendinitis, chronic B LBP w/o sciatica, scoliosis, diabetic peripheral neuropathy are also affecting patient's functional outcome.   REHAB POTENTIAL: Good  CLINICAL DECISION MAKING: Evolving/moderate complexity  EVALUATION COMPLEXITY: Moderate   GOALS: Goals reviewed with patient? Yes  SHORT TERM GOALS: Target date: 09/07/2023  Patient will be independent with initial HEP to improve outcomes and carryover.  Baseline:  Goal status: IN PROGRESS - 08/08/23 - reviewed today  2.  Patient will report 25% improvement in low back pain to improve QOL. Baseline: 0/10 on eval, up to  at least 7/10  Goal status: INITIAL  3.  Patient will report 25% reduction in frequency and intensity of L UE radiculopathy to improve QOL. Baseline: 3-5/10 on eval, up to 8-9/10 Goal status: INITIAL   LONG TERM GOALS: Target date: 10/12/2023  Patient will be independent with ongoing/advanced HEP for self-management at home.  Baseline:  Goal status: INITIAL  2.  Patient will report 50-75% improvement in low back pain to improve QOL.  Baseline: 0/10 on eval, up to at least 7/10 Goal status: INITIAL  3.  Patient will report 50-75% reduction in frequency and intensity of L UE radiculopathy to improve QOL. Baseline: 3-5/10 on eval, up to 8-9/10 Goal status: INITIAL   4.  Patient to demonstrate ability to achieve and maintain good spinal alignment/posturing and body mechanics needed for daily activities. Baseline: rounded shoulders, forward head, decreased lumbar lordosis, increased thoracic kyphosis, and flexed trunk posture Goal status: INITIAL  5.  Patient will demonstrate functional pain free cervical and lumbar ROM to perform ADLs.   Baseline: Refer to above cervical and lumbar ROM table Goal status: INITIAL  6.  Patient will demonstrate improved scapular, B shoulder and B LE  strength to >/= 4+/5 for improved stability and ease of mobility. Baseline:  Refer to above UE & LE MMT tables Goal status: INITIAL  7. Patient will report </= 24% on Modified Oswestry (MCID = 12%) to demonstrate improved functional ability with decreased pain interference. Baseline: 18 / 50 = 36.0 % Goal status: INITIAL  8.  Patient will report </= 16% on QuickDASH to demonstrate improved functional ability with decreased L UE radiculopathy interference. Baseline: 29.5 / 100 = 29.5 % Goal status: INITIAL   9.  Patient will improve FGA score by at least 4 points or to >/= 28/30 (baseline as of prior D/C from PT) to improve gait stability and reduce risk for falls.  Baseline: 22/30 - 08/10/23 Goal status: IN PROGRESS    PLAN:  PT FREQUENCY: 2x/week  PT DURATION: 8-10 weeks  PLANNED INTERVENTIONS: 97164- PT Re-evaluation, 97750- Physical Performance Testing, 97110-Therapeutic exercises, 97530- Therapeutic activity, 97112- Neuromuscular re-education, 97535- Self Care, 04540- Manual therapy, [phone]- Gait training, [phone]- Aquatic Therapy, [phone]- Electrical stimulation (unattended), [phone]- Ultrasound, [id number]- Traction (mechanical), [id number]- Ionotophoresis 4mg /ml Dexamethasone , 78469 (1-2 muscles), [id number] (3+ muscles)- Dry Needling, Patient/Family education, Balance training, Stair training, Taping, Joint mobilization, Spinal mobilization, Cryotherapy, and Moist heat  PLAN FOR NEXT SESSION: Progress core and postural flexibility and strengthening, updating HEP as indicated; MT +/- TPDN to address abnormal muscle tension   Francisco Irving, PT 08/10/2023, 2:39 PM

## 2023-08-17 ENCOUNTER — Ambulatory Visit: Admitting: Physical Therapy

## 2023-08-17 ENCOUNTER — Encounter: Payer: Self-pay | Admitting: Physical Therapy

## 2023-08-17 DIAGNOSIS — M5459 Other low back pain: Secondary | ICD-10-CM | POA: Diagnosis not present

## 2023-08-17 DIAGNOSIS — R2681 Unsteadiness on feet: Secondary | ICD-10-CM

## 2023-08-17 DIAGNOSIS — M5412 Radiculopathy, cervical region: Secondary | ICD-10-CM

## 2023-08-17 DIAGNOSIS — R293 Abnormal posture: Secondary | ICD-10-CM

## 2023-08-17 DIAGNOSIS — M6281 Muscle weakness (generalized): Secondary | ICD-10-CM

## 2023-08-17 NOTE — Therapy (Signed)
 OUTPATIENT PHYSICAL THERAPY TREATMENT   Patient Name: Noah Lane MRN: [id number] DOB:Mar 07, 1955, 68 y.o., male Today's Date: 08/17/2023  END OF SESSION:  PT End of Session - 08/17/23 0802     Visit Number 4    Date for PT Re-Evaluation 10/12/23    Authorization Type Medicare & BCBS    Progress Note Due on Visit 10    PT Start Time 0802    PT Stop Time 0847    PT Time Calculation (min) 45 min    Activity Tolerance Patient tolerated treatment well    Behavior During Therapy WFL for tasks assessed/performed             Past Medical History:  Diagnosis Date   Broken toe    Diabetes mellitus type 2 in obese    Mixed hyperlipidemia    Past Surgical History:  Procedure Laterality Date   COLON RESECTION N/A 04/27/2022   Procedure: LAPAROSCOPIC HAND ASSISTED RIGHT HEMI COLECTOMY, POSSIBLE OPEN;  Surgeon: Lujean Sake, MD;  Location: [facility];  Service: General;  Laterality: N/A;   COLONOSCOPY     HERNIA REPAIR  2012   Patient Active Problem List   Diagnosis Date Noted   Elevated blood pressure reading 05/04/2022   Colon adenocarcinoma (HCC) 05/04/2022   Gastroesophageal reflux disease 04/29/2022   Ileus, postoperative (HCC) 04/29/2022   Hypokalemia 04/28/2022   Hyperlipidemia 04/28/2022   Colonic obstruction (HCC) 04/25/2022   Hyponatremia 04/25/2022   OSA on CPAP 12/03/2021   Obesity (BMI 30.0-34.9) 12/03/2021   Mixed hyperlipidemia 10/20/2021   Strain of lumbar region 10/16/2021   Strain of calf muscle 07/22/2021   Patellofemoral pain syndrome of both knees 02/24/2021   Rotator cuff tendinitis, right 12/28/2019   Hip pain, bilateral 05/28/2019   Chronic bilateral low back pain without sciatica 05/28/2019   Type 2 diabetes mellitus with obesity (HCC) 05/28/2019   Erectile dysfunction 05/28/2019   Pain and swelling of left knee 03/12/2019   Lateral epicondylitis, right elbow 03/15/2017   Diverticulosis of large intestine without hemorrhage 02/25/2014     PCP: Jobe Mulder, DO   REFERRING PROVIDER: Frazier Jacob, DO   REFERRING DIAG: [id number] (ICD-10-CM) - Degeneration of intervertebral disc of lumbar region, unspecified whether pain present   THERAPY DIAG:  Other low back pain  Radiculopathy, cervical region  Muscle weakness (generalized)  Abnormal posture  Unsteadiness on feet  RATIONALE FOR EVALUATION AND TREATMENT: Rehabilitation  ONSET DATE: early April 2025  NEXT MD VISIT: None scheduled with referring provider, 08/19/23 with PCP   SUBJECTIVE:  SUBJECTIVE STATEMENT: Pt reports has been diligently performing his HEP to the point where he recalls the exercises w/o referring to the papers.  EVAL:  Pt reports he started getting LBP, mostly on R side, ~2 months ago w/o known MOI.  Pain first noted while rolling over in bed.  Pain comes in strikes w/o predictable trigger.  Pain is momentary lasting a few minutes when present.  He also reports recent onset of L UE numbness and tingling that he says Dr. Gwenette Lennox recommended he mention to PT.  UE radiculopathy also unpredictable in trigger and varies in duration.  He notes constant stiffness in neck, but denies pain.  He also reports balance issues, esp initially on standing, which he attributes to a weak core and his neuropathy.  PAIN: Are you having pain? No and Yes: NPRS scale: 0/10   Pain location: R low back  Pain description: sharp, short-lived twinge  Aggravating factors: unpredictable  Relieving factors: remain still until pain resolves   Are you having pain? Yes: NPRS scale: comes and goes at all types of intensities  Pain location: L UE down to radial side of hand, occasional in thumb  Pain description: vibration (reminds him of a TENS unit),  stiffness in neck  Aggravating factors: unpredictable  Relieving factors: heating pads, stretches   PERTINENT HISTORY:  DM-II, obesity, elevated BP, GERD, B patellofemoral pain syndrome, B hip pain, h/o R RTC tendinitis, chronic B LBP w/o sciatica, scoliosis, diabetic peripheral neuropathy  PRECAUTIONS: None  RED FLAGS: None  WEIGHT BEARING RESTRICTIONS: No  FALLS:  Has patient fallen in last 6 months? 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 I would like the shoulder thing to go away. Better balance.   OBJECTIVE: (objective measures completed at initial evaluation unless otherwise dated)  DIAGNOSTIC FINDINGS:  10/16/21 - DG lumbar spine IMPRESSION: No acute fracture or subluxation of the lumbar spine.  PATIENT SURVEYS:  Modified Oswestry 18 / 50 = 36.0 %, moderate disability   Quick Dash 29.5 / 100 = 29.5 %  COGNITION:  Overall cognitive status: History of cognitive impairments - at baseline (mostly STM memory recall)    SENSATION: WFL B peripheral neuropathy in feet  POSTURE:  rounded shoulders, forward head, decreased lumbar lordosis, increased thoracic kyphosis, and flexed trunk   PALPATION: Increased muscle tension in L>R UT and LS as well as R>L lumbar paraspinals and glutes/piriformis  CERVICAL ROM:   Active ROM Eval  Flexion 49  Extension 42  Right lateral flexion 24  Left lateral flexion 16  Right rotation 51  Left rotation 52   (Blank rows = not  tested)  UPPER EXTREMITY MMT:  MMT Right eval Left eval  Shoulder flexion 4+ 4-  Shoulder extension 4+ 4+  Shoulder abduction 4+ 4  Shoulder adduction    Shoulder internal rotation 4+ 4+  Shoulder external rotation 4 4  Middle trapezius 4- 4-  Lower trapezius 3+ 3+  Grip strength     (Blank rows =  not tested)   LUMBAR ROM:   Active  Eval  Flexion Hands to mid shins  Extension 25% limited  Right lateral flexion Hand to femoral condyle - tight  Left lateral flexion Hand to lateral knee - twinge in R low back  Right rotation WFL  Left rotation WFL  (Blank rows = not tested)  MUSCLE LENGTH: Hamstrings: mod/severe tight L>R ITB: mod/severe tight B Piriformis: mild/mod tight R>L Hip flexors: mod tight B Quads: mod tight R>L  LOWER EXTREMITY ROM:    Grossly WFL  LOWER EXTREMITY MMT:    MMT Right eval Left eval  Hip flexion 4 4  Hip extension 4- 4-  Hip abduction 4- 4-  Hip adduction 4- 3+  Hip internal rotation 5 4+  Hip external rotation 4- 3+  Knee flexion 5 4+  Knee extension 5 5  Ankle dorsiflexion 4+ 4+  Ankle plantarflexion    Ankle inversion    Ankle eversion     (Blank rows = not tested)  LUMBAR SPECIAL TESTS:  Straight leg raise test: Negative and Slump test: Negative  FUNCTIONAL TESTS: (08/10/23) 5 times sit to stand: 15.75 sec Functional gait assessment: 22/30; indicated medium risk for falls Berg balance scale: 50/56; indicates moderate (>50%) risk for falls    Scores as of D/C from PT in July 2024: Berg: 54/56 FGA: 28/30   TODAY'S TREATMENT:   08/17/2023  THERAPEUTIC EXERCISE: To improve strength and endurance.  Demonstration, verbal and tactile cues throughout for technique.  NuStep - L5 x 6 min - UE/LE  NEUROMUSCULAR RE-EDUCATION: To improve balance, coordination, kinesthesia, posture, proprioception, and reduce fall risk. Standing TrA + GTB scap retraction with B shoulder rows 2 x 10 - cues to avoid upward rotation of elbows Standing  TrA + GTB scap retraction with B shoulder extension 2 x 10 - cues to avoid shoulder shrug Standing TrA + GTB scap retraction with shoulder horiz ABD 2 x 10 - standing with back along door frame to facilitate improved posture and scapular activation Standing TrA + GTB scap retraction with shoulder ER 2 x 10 - standing with back along door frame to facilitate improved posture and scapular activation Standing TrA + GTB pallof press x 10 bil SLS with TrA isometric + RTB 4-way SLR x 10 bil   08/10/2023 THERAPEUTIC EXERCISE: To improve strength and endurance.  Demonstration, verbal and tactile cues throughout for technique.  NuStep - L4 x 6 min - UE/LE  PHYSICAL PERFORMANCE TEST or MEASUREMENT: 5xSTS = 15.75 sec; >15 sec indicates risk for recurrent falls Berg = 50/56; indicates moderate (>50%) risk for falls FGA = 22/30; indicates medium risk for falls  Berg Balance Test   Sit to Stand Able to stand without using hands and stabilize independently    Standing Unsupported Able to stand safely 2 minutes    Sitting with Back Unsupported but Feet Supported on Floor or Stool Able to sit safely and securely 2 minutes    Stand to Sit Sits safely with minimal use of hands    Transfers Able to transfer safely, minor use of hands    Standing Unsupported with Eyes Closed Able to stand 10 seconds with supervision    Standing Unsupported with Feet Together Able to place feet together independently and stand 1 minute safely    From Standing, Reach Forward with Outstretched Arm Can reach forward >12 cm safely (5)    From Standing Position, Pick up Object from Floor Able to pick up shoe safely  and easily    From Standing Position, Turn to Look Behind Over each Shoulder Looks behind from both sides and weight shifts well    Turn 360 Degrees Able to turn 360 degrees safely in 4 seconds or less    Standing Unsupported, Alternately Place Feet on Step/Stool Able to stand independently and safely and complete 8 steps  in 20 seconds    Standing Unsupported, One Foot in Front Able to plae foot ahead of the other independently and hold 30 seconds    Standing on One Leg Tries to lift leg/unable to hold 3 seconds but remains standing independently    Total Score 50    Berg comment: 46-51 moderate (>50%)      Functional Gait  Assessment   Gait Level Surface Walks 20 ft in less than 7 sec but greater than 5.5 sec, uses assistive device, slower speed, mild gait deviations, or deviates 6-10 in outside of the 12 in walkway width.    Change in Gait Speed Able to smoothly change walking speed without loss of balance or gait deviation. Deviate no more than 6 in outside of the 12 in walkway width.    Gait with Horizontal Head Turns Performs head turns smoothly with no change in gait. Deviates no more than 6 in outside 12 in walkway width    Gait with Vertical Head Turns Performs head turns with no change in gait. Deviates no more than 6 in outside 12 in walkway width.    Gait and Pivot Turn Pivot turns safely within 3 sec and stops quickly with no loss of balance.    Step Over Obstacle Is able to step over one shoe box (4.5 in total height) without changing gait speed. No evidence of imbalance.    Gait with Narrow Base of Support Ambulates 4-7 steps.    Gait with Eyes Closed Walks 20 ft, slow speed, abnormal gait pattern, evidence for imbalance, deviates 10-15 in outside 12 in walkway width. Requires more than 9 sec to ambulate 20 ft.    Ambulating Backwards Walks 20 ft, slow speed, abnormal gait pattern, evidence for imbalance, deviates 10-15 in outside 12 in walkway width.    Steps Alternating feet, no rail.    Total Score 22    FGA comment: 19-24 = medium risk fall      NEUROMUSCULAR RE-EDUCATION: To improve coordination, kinesthesia, and posture.  Hooklying TrA + GTB shoulder horiz ABD 10 x 5 Hooklying TrA + alt GTB hip ABD/ER bent knee fallout 10 x 5   08/08/2023  THERAPEUTIC EXERCISE: To improve strength,  endurance, ROM, and flexibility.  Demonstration, verbal and tactile cues throughout for technique.  Rec Bike - L2 x 6 min Doorway pec stretch at 90 ABD 2 x 30  Seated scapular retraction +ER 10 x 3-5 Supine HS stretch with strap 3 x 30 bil Supine cross-body ITB stretch with strap 3 x 30 bil Supine gluteus stretch 3 x 30 bil Hooklying KTOS piriformis stretch 3 x 30 bil Supine LTR 5 x 10 Hooklying hip ADD isometric with ball 10 x 5  NEUROMUSCULAR RE-EDUCATION: To improve coordination, kinesthesia, and posture.  Hooklying TrA + GTB shoulder horiz ABD 10 x 5 Hooklying TrA + GTB shoulder ER 10 x 5 Hooklying TrA + alt GTB hip ABD/ER bent knee fallout 10 x 5   08/03/2023  SELF CARE:  Reviewed eval findings and role of PT in addressing identified deficits as well as instruction in initial HEP (see below).  PATIENT EDUCATION:  Education details: HEP progression  Person educated: Patient Education method: Explanation, Demonstration, Verbal cues, Handouts, and MedBridgeGO app updated Education comprehension: verbalized understanding, returned demonstration, verbal cues required, and needs further education  HOME EXERCISE PROGRAM: *Pt has MedBridgeGO access but also prefers printed handouts.  Access Code: [email or web address] URL: [email or web address] Date: 08/17/2023 Prepared by: Felecia Hopper  Exercises - Supine Hamstring Stretch with Strap  - 1-2 x daily - 7 x weekly - 3 reps - 30 sec hold - Supine Iliotibial Band Stretch with Strap  - 1-2 x daily - 7 x weekly - 3 reps - 30 sec hold - Supine Gluteus Stretch  - 1-2 x daily - 7 x weekly - 3 reps - 30 sec hold - Supine Piriformis Stretch with Foot on Ground  - 1-2 x daily - 7 x weekly - 3 reps - 30 sec hold - Supine Lower Trunk Rotation  - 1-2 x daily - 7 x weekly - 2 sets - 5 reps - 10 sec hold - Supine Hip Adduction Isometric with Ball  - 1-2 x daily - 7 x weekly - 2 sets - 10 reps - 5 sec hold - Doorway Pec Stretch at  90 Degrees Abduction  - 1-2 x daily - 7 x weekly - 3 reps - 30 sec hold - Seated Scapular Retraction with External Rotation  - 1-2 x daily - 7 x weekly - 2 sets - 10 reps - 3-5 sec hold - Hooklying Single Leg Bent Knee Fallouts with Resistance  - 1 x daily - 3 x weekly - 2 sets - 10 reps - 3 sec hold - Supine Shoulder Horizontal Abduction with Resistance  - 1 x daily - 3 x weekly - 2 sets - 10 reps - 3 sec hold - Standing Shoulder Row with Anchored Resistance  - 1 x daily - 3 x weekly - 2 sets - 10 reps - 5 sec hold - Scapular Retraction with Resistance Advanced  - 1 x daily - 3 x weekly - 2 sets - 10 reps - 5 sec hold - Standing Shoulder Horizontal Abduction with Resistance  - 1 x daily - 3 x weekly - 2 sets - 10 reps - 3 sec hold - Shoulder External Rotation and Scapular Retraction with Resistance  - 1 x daily - 3 x weekly - 2 sets - 10 reps - 3-5 sec hold - Standing Anti-Rotation Press with Anchored Resistance  - 1 x daily - 3 x weekly - 2 sets - 10 reps - 3 sec hold - Standing Hip Flexion with Anchored Resistance and Chair Support  - 1 x daily - 3 x weekly - 2 sets - 10 reps - 3 sec hold - Standing Hip Adduction with Resistance  - 1 x daily - 3 x weekly - 2 sets - 10 reps - 3 sec hold - Standing Hip Extension with Resistance  - 1 x daily - 3 x weekly - 2 sets - 10 reps - 3 sec hold - Standing Hip Abduction with Resistance  - 1 x daily - 3 x weekly - 2 sets - 10 reps - 3 sec hold   ASSESSMENT:  CLINICAL IMPRESSION: Noah Lane reports good compliance with HEP and denies need for further review or modification - STG #1 met. Progressed postural and core strengthening, introducing core activation awareness in standing during scapular and LE strengthening.  HEP instructions provided for all exercises at pt request as he wants to be able to mix things  up with his home exercises.  Noah Lane will benefit from continued skilled PT to address ongoing ROM and strength deficits to improve mobility and activity  tolerance with decreased pain interference.    EVAL: Noah Lane is a 68 y.o. male who was referred to physical therapy for evaluation and treatment for R sided LBP 2 lumbar DDD.  Patient also c/o intermittent L UE radiculopathy (vibration sensation).  Patient reports onset of R sided LBP pain beginning ~2 months ago w/o know MOI.  Triggers for LBP and R UE radiculopathy are unpredictable.  Patient has deficits in cervical and lumbar ROM, proximal B LE flexibility, L>R UE/scapular strength, B LE strength, core/postural weakness with abnormal posture, and abnormal muscle tension which are interfering with ADLs and are impacting quality of life.  On Modified Oswestry patient scored 18/50 demonstrating 36% or moderate disability.  On QuickDASH patient scored 29.5/100 demonstrating 29.5% disability.  He also notes impaired balance which will be formally assessed next visit.  Noah Lane will benefit from skilled PT to address above deficits to improve mobility and activity tolerance with decreased pain interference.     OBJECTIVE IMPAIRMENTS: decreased activity tolerance, decreased balance, decreased knowledge of condition, decreased mobility, difficulty walking, decreased ROM, decreased strength, increased fascial restrictions, impaired perceived functional ability, increased muscle spasms, impaired flexibility, impaired sensation, impaired UE functional use, improper body mechanics, postural dysfunction, and pain.   ACTIVITY LIMITATIONS: carrying, lifting, bending, sitting, standing, squatting, sleeping, stairs, transfers, bed mobility, reach over head, locomotion level, and caring for others  PARTICIPATION LIMITATIONS: meal prep, cleaning, laundry, community activity, and yard work  PERSONAL FACTORS: Age, Fitness, Past/current experiences, Time since onset of injury/illness/exacerbation, and 3+ comorbidities: DM-II, obesity, elevated BP, GERD, B patellofemoral pain syndrome, B hip pain, h/o R RTC  tendinitis, chronic B LBP w/o sciatica, scoliosis, diabetic peripheral neuropathy are also affecting patient's functional outcome.   REHAB POTENTIAL: Good  CLINICAL DECISION MAKING: Evolving/moderate complexity  EVALUATION COMPLEXITY: Moderate   GOALS: Goals reviewed with patient? Yes  SHORT TERM GOALS: Target date: 09/07/2023  Patient will be independent with initial HEP to improve outcomes and carryover.  Baseline:  08/08/23 - reviewed today Goal status: MET - 08/17/23  2.  Patient will report 25% improvement in low back pain to improve QOL. Baseline: 0/10 on eval, up to at least 7/10  Goal status: INITIAL - 08/17/23 - Twinges remain unpredictable but very short-lived  3.  Patient will report 25% reduction in frequency and intensity of L UE radiculopathy to improve QOL. Baseline: 3-5/10 on eval, up to 8-9/10 Goal status: IN PROGRESS - 08/17/23 - still come and goes   LONG TERM GOALS: Target date: 10/12/2023  Patient will be independent with ongoing/advanced HEP for self-management at home.  Baseline:  Goal status: INITIAL  2.  Patient will report 50-75% improvement in low back pain to improve QOL.  Baseline: 0/10 on eval, up to at least 7/10 Goal status: INITIAL  3.  Patient will report 50-75% reduction in frequency and intensity of L UE radiculopathy to improve QOL. Baseline: 3-5/10 on eval, up to 8-9/10 Goal status: INITIAL   4.  Patient to demonstrate ability to achieve and maintain good spinal alignment/posturing and body mechanics needed for daily activities. Baseline: rounded shoulders, forward head, decreased lumbar lordosis, increased thoracic kyphosis, and flexed trunk posture Goal status: INITIAL  5.  Patient will demonstrate functional pain free cervical and lumbar ROM to perform ADLs.   Baseline: Refer to above cervical and lumbar ROM table Goal  status: INITIAL  6.  Patient will demonstrate improved scapular, B shoulder and B LE  strength to >/= 4+/5 for  improved stability and ease of mobility. Baseline: Refer to above UE & LE MMT tables Goal status: INITIAL  7. Patient will report </= 24% on Modified Oswestry (MCID = 12%) to demonstrate improved functional ability with decreased pain interference. Baseline: 18 / 50 = 36.0 % Goal status: INITIAL  8.  Patient will report </= 16% on QuickDASH to demonstrate improved functional ability with decreased L UE radiculopathy interference. Baseline: 29.5 / 100 = 29.5 % Goal status: INITIAL   9.  Patient will improve FGA score by at least 4 points or to >/= 28/30 (baseline as of prior D/C from PT) to improve gait stability and reduce risk for falls.  Baseline: 22/30 - 08/10/23 Goal status: IN PROGRESS    PLAN:  PT FREQUENCY: 2x/week  PT DURATION: 8-10 weeks  PLANNED INTERVENTIONS: 97164- PT Re-evaluation, 97750- Physical Performance Testing, 97110-Therapeutic exercises, 97530- Therapeutic activity, 97112- Neuromuscular re-education, 97535- Self Care, 13244- Manual therapy, [phone]- Gait training, [phone]- Aquatic Therapy, [phone]- Electrical stimulation (unattended), [phone]- Ultrasound, [id number]- Traction (mechanical), [id number]- Ionotophoresis 4mg /ml Dexamethasone , 56387 (1-2 muscles), 20561 (3+ muscles)- Dry Needling, Patient/Family education, Balance training, Stair training, Taping, Joint mobilization, Spinal mobilization, Cryotherapy, and Moist heat  PLAN FOR NEXT SESSION: Progress core and postural flexibility and strengthening, updating HEP as indicated; MT +/- TPDN to address abnormal muscle tension   Francisco Irving, PT 08/17/2023, 9:30 AM

## 2023-08-19 ENCOUNTER — Ambulatory Visit: Payer: Self-pay | Admitting: Family Medicine

## 2023-08-19 ENCOUNTER — Ambulatory Visit (INDEPENDENT_AMBULATORY_CARE_PROVIDER_SITE_OTHER): Admitting: Family Medicine

## 2023-08-19 ENCOUNTER — Encounter: Payer: Self-pay | Admitting: Family Medicine

## 2023-08-19 ENCOUNTER — Ambulatory Visit: Admitting: Physical Therapy

## 2023-08-19 ENCOUNTER — Encounter: Payer: Self-pay | Admitting: Physical Therapy

## 2023-08-19 VITALS — BP 126/82 | HR 75 | Temp 98.0°F | Resp 16 | Ht 73.0 in | Wt 257.0 lb

## 2023-08-19 DIAGNOSIS — M6281 Muscle weakness (generalized): Secondary | ICD-10-CM

## 2023-08-19 DIAGNOSIS — E1169 Type 2 diabetes mellitus with other specified complication: Secondary | ICD-10-CM | POA: Diagnosis not present

## 2023-08-19 DIAGNOSIS — E782 Mixed hyperlipidemia: Secondary | ICD-10-CM | POA: Diagnosis not present

## 2023-08-19 DIAGNOSIS — R2681 Unsteadiness on feet: Secondary | ICD-10-CM

## 2023-08-19 DIAGNOSIS — R293 Abnormal posture: Secondary | ICD-10-CM

## 2023-08-19 DIAGNOSIS — E669 Obesity, unspecified: Secondary | ICD-10-CM | POA: Diagnosis not present

## 2023-08-19 DIAGNOSIS — M5412 Radiculopathy, cervical region: Secondary | ICD-10-CM

## 2023-08-19 DIAGNOSIS — R809 Proteinuria, unspecified: Secondary | ICD-10-CM | POA: Diagnosis not present

## 2023-08-19 DIAGNOSIS — M5459 Other low back pain: Secondary | ICD-10-CM

## 2023-08-19 DIAGNOSIS — Z7985 Long-term (current) use of injectable non-insulin antidiabetic drugs: Secondary | ICD-10-CM

## 2023-08-19 DIAGNOSIS — R262 Difficulty in walking, not elsewhere classified: Secondary | ICD-10-CM

## 2023-08-19 LAB — COMPREHENSIVE METABOLIC PANEL WITH GFR
ALT: 19 U/L (ref 0–53)
AST: 17 U/L (ref 0–37)
Albumin: 4.6 g/dL (ref 3.5–5.2)
Alkaline Phosphatase: 39 U/L (ref 39–117)
BUN: 23 mg/dL (ref 6–23)
CO2: 27 meq/L (ref 19–32)
Calcium: 9.2 mg/dL (ref 8.4–10.5)
Chloride: 102 meq/L (ref 96–112)
Creatinine, Ser: 1.16 mg/dL (ref 0.40–1.50)
GFR: 64.26 mL/min (ref >60.00)
Glucose, Bld: 127 mg/dL — ABNORMAL HIGH (ref 70–99)
Potassium: 4.3 meq/L (ref 3.5–5.1)
Sodium: 137 meq/L (ref 135–145)
Total Bilirubin: 1.1 mg/dL (ref 0.2–1.2)
Total Protein: 6.8 g/dL (ref 6.0–8.3)

## 2023-08-19 LAB — MICROALBUMIN / CREATININE URINE RATIO
Creatinine,U: 93.3 mg/dL
Microalb Creat Ratio: 20.6 mg/g (ref 0.0–30.0)
Microalb, Ur: 1.9 mg/dL (ref 0.0–1.9)

## 2023-08-19 LAB — LIPID PANEL
Cholesterol: 136 mg/dL (ref 0–200)
HDL: 36.5 mg/dL — ABNORMAL LOW (ref >39.00)
LDL Cholesterol: 65 mg/dL (ref 0–99)
NonHDL: 99.6
Total CHOL/HDL Ratio: 4
Triglycerides: 174 mg/dL — ABNORMAL HIGH (ref 0.0–149.0)
VLDL: 34.8 mg/dL (ref 0.0–40.0)

## 2023-08-19 LAB — HEMOGLOBIN A1C: Hgb A1c MFr Bld: 7.1 % — ABNORMAL HIGH (ref 4.6–6.5)

## 2023-08-19 MED ORDER — SEMAGLUTIDE (1 MG/DOSE) 4 MG/3ML ~~LOC~~ SOPN: 1.0000 mg | PEN_INJECTOR | SUBCUTANEOUS | 5 refills | Status: DC

## 2023-08-19 NOTE — Patient Instructions (Addendum)
 Give us  2-3 business days to get the results of your labs back.   Keep the diet clean and stay active.  We may drop off your metformin  and increase Ozempic .   Let us  know if you need anything.

## 2023-08-19 NOTE — Progress Notes (Signed)
 Subjective:   Chief Complaint  Patient presents with   Follow-up    Follow Up    Noah Lane is a 68 y.o. male here for follow-up of diabetes.   Lyell's self monitored glucose range is mid 100's.  Patient denies hypoglycemic reactions. He has CGM. Patient does not require insulin .   Medications include: Ozempic  0.5 mg/week Diet is usually healthy.  Exercise: walking  Mixed Hyperlipidemia Patient presents for mixed hyperlipidemia follow up. Currently being treated with Lipitor 20 mg/d and compliance with treatment thus far has been good. He denies myalgias. Diet/exercise as above.  No CP or SOB.  The patient is not known to have coexisting coronary artery disease.  Past Medical History:  Diagnosis Date   Broken toe    Diabetes mellitus type 2 in obese    Mixed hyperlipidemia      Related testing: Retinal exam: Done Pneumovax: done  Objective:  BP 126/82 (BP Location: Left Arm, Patient Position: Sitting)   Pulse 75   Temp 98 F (36.7 C) (Oral)   Resp 16   Ht 6' 1 (1.854 m)   Wt 257 lb (116.6 kg)   SpO2 98%   BMI 33.91 kg/m  General:  Well developed, well nourished, in no apparent distress Lungs:  CTAB, no access msc use Cardio:  RRR, no bruits, no LE edema Psych: Age appropriate judgment and insight  Assessment:   Type 2 diabetes mellitus with obesity (HCC) - Plan: Comprehensive metabolic panel with GFR, Hemoglobin A1c, Lipid panel, Microalbumin / creatinine urine ratio  Mixed hyperlipidemia  Proteinuria, unspecified type   Plan:   Chronic, unsure if stable. For now will cont Metformin  XR 500 mg/d, Ozempic  0.5 mg/week. May increase dosage of latter. Counseled on diet and exercise. Chronic, unstable. Cont Lipitor 20 mg/d. Could max out or see if wt loss w above change is beneficial.  May need to start SGLT-2 inhibitor. On ARB.  F/u in 3-6 mo pending above. The patient voiced understanding and agreement to the plan.  Shellie Dials Harrison,  DO 08/19/23 8:06 AM

## 2023-08-19 NOTE — Therapy (Signed)
 OUTPATIENT PHYSICAL THERAPY TREATMENT   Patient Name: Noah Lane MRN: [id number] DOB:06-13-1955, 68 y.o., male Today's Date: 08/19/2023  END OF SESSION:  PT End of Session - 08/19/23 0833     Visit Number 5    Date for PT Re-Evaluation 10/12/23    Authorization Type Medicare & BCBS    PT Start Time [phone]    PT Stop Time 0920    PT Time Calculation (min) 47 min    Activity Tolerance Patient tolerated treatment well    Behavior During Therapy WFL for tasks assessed/performed             Past Medical History:  Diagnosis Date   Broken toe    Diabetes mellitus type 2 in obese    Mixed hyperlipidemia    Past Surgical History:  Procedure Laterality Date   COLON RESECTION N/A 04/27/2022   Procedure: LAPAROSCOPIC HAND ASSISTED RIGHT HEMI COLECTOMY, POSSIBLE OPEN;  Surgeon: Lujean Sake, MD;  Location: [facility];  Service: General;  Laterality: N/A;   COLONOSCOPY     HERNIA REPAIR  2012   Patient Active Problem List   Diagnosis Date Noted   Elevated blood pressure reading 05/04/2022   Colon adenocarcinoma (HCC) 05/04/2022   Gastroesophageal reflux disease 04/29/2022   Ileus, postoperative (HCC) 04/29/2022   Hypokalemia 04/28/2022   Hyperlipidemia 04/28/2022   Colonic obstruction (HCC) 04/25/2022   Hyponatremia 04/25/2022   OSA on CPAP 12/03/2021   Obesity (BMI 30.0-34.9) 12/03/2021   Mixed hyperlipidemia 10/20/2021   Strain of lumbar region 10/16/2021   Strain of calf muscle 07/22/2021   Patellofemoral pain syndrome of both knees 02/24/2021   Rotator cuff tendinitis, right 12/28/2019   Hip pain, bilateral 05/28/2019   Chronic bilateral low back pain without sciatica 05/28/2019   Type 2 diabetes mellitus with obesity (HCC) 05/28/2019   Erectile dysfunction 05/28/2019   Pain and swelling of left knee 03/12/2019   Lateral epicondylitis, right elbow 03/15/2017   Diverticulosis of large intestine without hemorrhage 02/25/2014    PCP: Jobe Mulder, DO    REFERRING PROVIDER: Frazier Jacob, DO   REFERRING DIAG: [id number] (ICD-10-CM) - Degeneration of intervertebral disc of lumbar region, unspecified whether pain present   THERAPY DIAG:  Other low back pain  Radiculopathy, cervical region  Muscle weakness (generalized)  Abnormal posture  Unsteadiness on feet  Difficulty in walking, not elsewhere classified  RATIONALE FOR EVALUATION AND TREATMENT: Rehabilitation  ONSET DATE: early April 2025  NEXT MD VISIT: None scheduled with referring provider, 08/19/23 with PCP   SUBJECTIVE:  SUBJECTIVE STATEMENT: I am doing pretty good, doing the HEP, walking in the morning, not much pain   EVAL:  Pt reports he started getting LBP, mostly on R side, ~2 months ago w/o known MOI.  Pain first noted while rolling over in bed.  Pain comes in strikes w/o predictable trigger.  Pain is momentary lasting a few minutes when present.  He also reports recent onset of L UE numbness and tingling that he says Dr. Gwenette Lennox recommended he mention to PT.  UE radiculopathy also unpredictable in trigger and varies in duration.  He notes constant stiffness in neck, but denies pain.  He also reports balance issues, esp initially on standing, which he attributes to a weak core and his neuropathy.  PAIN: Are you having pain? No and Yes: NPRS scale: 0/10   Pain location: R low back  Pain description: sharp, short-lived twinge  Aggravating factors: unpredictable  Relieving factors: remain still until pain resolves   Are you having pain? Yes: NPRS scale: comes and goes at all types of intensities  Pain location: L UE down to radial side of hand, occasional in thumb  Pain description: vibration (reminds him of a TENS unit), stiffness in neck  Aggravating factors:  unpredictable  Relieving factors: heating pads, stretches   PERTINENT HISTORY:  DM-II, obesity, elevated BP, GERD, B patellofemoral pain syndrome, B hip pain, h/o R RTC tendinitis, chronic B LBP w/o sciatica, scoliosis, diabetic peripheral neuropathy  PRECAUTIONS: None  RED FLAGS: None  WEIGHT BEARING RESTRICTIONS: No  FALLS:  Has patient fallen in last 6 months? 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 I would like the shoulder thing to go away. Better balance.   OBJECTIVE: (objective measures completed at initial evaluation unless otherwise dated)  DIAGNOSTIC FINDINGS:  10/16/21 - DG lumbar spine IMPRESSION: No acute fracture or subluxation of the lumbar spine.  PATIENT SURVEYS:  Modified Oswestry 18 / 50 = 36.0 %, moderate disability   Quick Dash 29.5 / 100 = 29.5 %  COGNITION:  Overall cognitive status: History of cognitive impairments - at baseline (mostly STM memory recall)    SENSATION: WFL B peripheral neuropathy in feet  POSTURE:  rounded shoulders, forward head, decreased lumbar lordosis, increased thoracic kyphosis, and flexed trunk   PALPATION: Increased muscle tension in L>R UT and LS as well as R>L lumbar paraspinals and glutes/piriformis  CERVICAL ROM:   Active ROM Eval  Flexion 49  Extension 42  Right lateral flexion 24  Left lateral flexion 16  Right rotation 51  Left rotation 52   (Blank rows = not tested)  UPPER EXTREMITY MMT:  MMT Right  eval Left eval  Shoulder flexion 4+ 4-  Shoulder extension 4+ 4+  Shoulder abduction 4+ 4  Shoulder adduction    Shoulder internal rotation 4+ 4+  Shoulder external rotation 4 4  Middle trapezius 4- 4-  Lower trapezius 3+ 3+  Grip strength     (Blank rows = not tested)  LUMBAR ROM:   Active  Eval  Flexion Hands to mid shins  Extension 25% limited  Right lateral flexion Hand to femoral condyle - tight  Left lateral flexion Hand to lateral knee - twinge in R low back  Right rotation WFL  Left rotation WFL  (Blank rows = not tested)  MUSCLE LENGTH: Hamstrings: mod/severe tight L>R ITB: mod/severe tight B Piriformis: mild/mod tight R>L Hip flexors: mod tight B Quads: mod tight R>L  LOWER EXTREMITY ROM:    Grossly WFL  LOWER EXTREMITY MMT:    MMT Right eval Left eval  Hip flexion 4 4  Hip extension 4- 4-  Hip abduction 4- 4-  Hip adduction 4- 3+  Hip internal rotation 5 4+  Hip external rotation 4- 3+  Knee flexion 5 4+  Knee extension 5 5  Ankle dorsiflexion 4+ 4+  Ankle plantarflexion    Ankle inversion    Ankle eversion     (Blank rows = not tested)  LUMBAR SPECIAL TESTS:  Straight leg raise test: Negative and Slump test: Negative  FUNCTIONAL TESTS: (08/10/23) 5 times sit to stand: 15.75 sec Functional gait assessment: 22/30; indicated medium risk for falls Berg balance scale: 50/56; indicates moderate (>50%) risk for falls    Scores as of D/C from PT in July 2024: Berg: 54/56 FGA: 28/30   TODAY'S TREATMENT:  08/19/23 Nustep level 5 x 6 minutes Rows 25# cues for posture Leg curls 25# 2x10, cues to go slow Leg extension 10# 2x10 cues for TKE and VMO  Feet on ball K2C, rotation, small bridge, isometric abs requires a lot of cues for form and had a lot of questions regarding how to use Passive LE stretches HS and piriformis Looked at 65 and 75cm ball at desk for posture as he had questions about it and form Red tband ER  08/17/2023  THERAPEUTIC  EXERCISE: To improve strength and endurance.  Demonstration, verbal and tactile cues throughout for technique.  NuStep - L5 x 6 min - UE/LE  NEUROMUSCULAR RE-EDUCATION: To improve balance, coordination, kinesthesia, posture, proprioception, and reduce fall risk. Standing TrA + GTB scap retraction with B shoulder rows 2 x 10 - cues to avoid upward rotation of elbows Standing TrA + GTB scap retraction with B shoulder extension 2 x 10 - cues to avoid shoulder shrug Standing TrA + GTB scap retraction with shoulder horiz ABD 2 x 10 - standing with back along door frame to facilitate improved posture and scapular activation Standing TrA + GTB scap retraction with shoulder ER 2 x 10 - standing with back along door frame to facilitate improved posture and scapular activation Standing TrA + GTB pallof press x 10 bil SLS with TrA isometric + RTB 4-way SLR x 10 bil   08/10/2023 THERAPEUTIC EXERCISE: To improve strength and endurance.  Demonstration, verbal and tactile cues throughout for technique.  NuStep - L4 x 6 min - UE/LE  PHYSICAL PERFORMANCE TEST or MEASUREMENT: 5xSTS = 15.75 sec; >15 sec indicates risk for recurrent falls Berg = 50/56; indicates moderate (>50%) risk for falls FGA = 22/30; indicates medium risk for falls  Berg Balance Test   Sit to Stand Able to stand without using hands and stabilize independently    Standing Unsupported Able to stand safely 2 minutes    Sitting with Back Unsupported but Feet Supported on Floor or Stool Able to sit safely and securely 2 minutes    Stand to Sit Sits safely with minimal use of hands  Transfers Able to transfer safely, minor use of hands    Standing Unsupported with Eyes Closed Able to stand 10 seconds with supervision    Standing Unsupported with Feet Together Able to place feet together independently and stand 1 minute safely    From Standing, Reach Forward with Outstretched Arm Can reach forward >12 cm safely (5)    From Standing  Position, Pick up Object from Floor Able to pick up shoe safely and easily    From Standing Position, Turn to Look Behind Over each Shoulder Looks behind from both sides and weight shifts well    Turn 360 Degrees Able to turn 360 degrees safely in 4 seconds or less    Standing Unsupported, Alternately Place Feet on Step/Stool Able to stand independently and safely and complete 8 steps in 20 seconds    Standing Unsupported, One Foot in Front Able to plae foot ahead of the other independently and hold 30 seconds    Standing on One Leg Tries to lift leg/unable to hold 3 seconds but remains standing independently    Total Score 50    Berg comment: 46-51 moderate (>50%)      Functional Gait  Assessment   Gait Level Surface Walks 20 ft in less than 7 sec but greater than 5.5 sec, uses assistive device, slower speed, mild gait deviations, or deviates 6-10 in outside of the 12 in walkway width.    Change in Gait Speed Able to smoothly change walking speed without loss of balance or gait deviation. Deviate no more than 6 in outside of the 12 in walkway width.    Gait with Horizontal Head Turns Performs head turns smoothly with no change in gait. Deviates no more than 6 in outside 12 in walkway width    Gait with Vertical Head Turns Performs head turns with no change in gait. Deviates no more than 6 in outside 12 in walkway width.    Gait and Pivot Turn Pivot turns safely within 3 sec and stops quickly with no loss of balance.    Step Over Obstacle Is able to step over one shoe box (4.5 in total height) without changing gait speed. No evidence of imbalance.    Gait with Narrow Base of Support Ambulates 4-7 steps.    Gait with Eyes Closed Walks 20 ft, slow speed, abnormal gait pattern, evidence for imbalance, deviates 10-15 in outside 12 in walkway width. Requires more than 9 sec to ambulate 20 ft.    Ambulating Backwards Walks 20 ft, slow speed, abnormal gait pattern, evidence for imbalance, deviates 10-15  in outside 12 in walkway width.    Steps Alternating feet, no rail.    Total Score 22    FGA comment: 19-24 = medium risk fall      NEUROMUSCULAR RE-EDUCATION: To improve coordination, kinesthesia, and posture.  Hooklying TrA + GTB shoulder horiz ABD 10 x 5 Hooklying TrA + alt GTB hip ABD/ER bent knee fallout 10 x 5   08/08/2023  THERAPEUTIC EXERCISE: To improve strength, endurance, ROM, and flexibility.  Demonstration, verbal and tactile cues throughout for technique.  Rec Bike - L2 x 6 min Doorway pec stretch at 90 ABD 2 x 30  Seated scapular retraction +ER 10 x 3-5 Supine HS stretch with strap 3 x 30 bil Supine cross-body ITB stretch with strap 3 x 30 bil Supine gluteus stretch 3 x 30 bil Hooklying KTOS piriformis stretch 3 x 30 bil Supine LTR 5 x 10 Hooklying hip ADD  isometric with ball 10 x 5  NEUROMUSCULAR RE-EDUCATION: To improve coordination, kinesthesia, and posture.  Hooklying TrA + GTB shoulder horiz ABD 10 x 5 Hooklying TrA + GTB shoulder ER 10 x 5 Hooklying TrA + alt GTB hip ABD/ER bent knee fallout 10 x 5   08/03/2023  SELF CARE:  Reviewed eval findings and role of PT in addressing identified deficits as well as instruction in initial HEP (see below).    PATIENT EDUCATION:  Education details: HEP progression  Person educated: Patient Education method: Explanation, Demonstration, Verbal cues, Handouts, and MedBridgeGO app updated Education comprehension: verbalized understanding, returned demonstration, verbal cues required, and needs further education  HOME EXERCISE PROGRAM: *Pt has MedBridgeGO access but also prefers printed handouts.  Access Code: [email or web address] URL: [email or web address] Date: 08/17/2023 Prepared by: Felecia Hopper  Exercises - Supine Hamstring Stretch with Strap  - 1-2 x daily - 7 x weekly - 3 reps - 30 sec hold - Supine Iliotibial Band Stretch with Strap  - 1-2 x daily - 7 x weekly - 3 reps - 30 sec hold - Supine  Gluteus Stretch  - 1-2 x daily - 7 x weekly - 3 reps - 30 sec hold - Supine Piriformis Stretch with Foot on Ground  - 1-2 x daily - 7 x weekly - 3 reps - 30 sec hold - Supine Lower Trunk Rotation  - 1-2 x daily - 7 x weekly - 2 sets - 5 reps - 10 sec hold - Supine Hip Adduction Isometric with Ball  - 1-2 x daily - 7 x weekly - 2 sets - 10 reps - 5 sec hold - Doorway Pec Stretch at 90 Degrees Abduction  - 1-2 x daily - 7 x weekly - 3 reps - 30 sec hold - Seated Scapular Retraction with External Rotation  - 1-2 x daily - 7 x weekly - 2 sets - 10 reps - 3-5 sec hold - Hooklying Single Leg Bent Knee Fallouts with Resistance  - 1 x daily - 3 x weekly - 2 sets - 10 reps - 3 sec hold - Supine Shoulder Horizontal Abduction with Resistance  - 1 x daily - 3 x weekly - 2 sets - 10 reps - 3 sec hold - Standing Shoulder Row with Anchored Resistance  - 1 x daily - 3 x weekly - 2 sets - 10 reps - 5 sec hold - Scapular Retraction with Resistance Advanced  - 1 x daily - 3 x weekly - 2 sets - 10 reps - 5 sec hold - Standing Shoulder Horizontal Abduction with Resistance  - 1 x daily - 3 x weekly - 2 sets - 10 reps - 3 sec hold - Shoulder External Rotation and Scapular Retraction with Resistance  - 1 x daily - 3 x weekly - 2 sets - 10 reps - 3-5 sec hold - Standing Anti-Rotation Press with Anchored Resistance  - 1 x daily - 3 x weekly - 2 sets - 10 reps - 3 sec hold - Standing Hip Flexion with Anchored Resistance and Chair Support  - 1 x daily - 3 x weekly - 2 sets - 10 reps - 3 sec hold - Standing Hip Adduction with Resistance  - 1 x daily - 3 x weekly - 2 sets - 10 reps - 3 sec hold - Standing Hip Extension with Resistance  - 1 x daily - 3 x weekly - 2 sets - 10 reps - 3 sec hold - Standing Hip Abduction with Resistance  -  1 x daily - 3 x weekly - 2 sets - 10 reps - 3 sec hold   ASSESSMENT:  CLINICAL IMPRESSION: Noah Lane reports good compliance with HEP, walks dogs daily.  He has a lot of questions about all  exercises. Progressed postural and core strengthening, introducing core activation awareness in standing during scapular and LE strengthening  again a lot of questions regarding the exercises and how to do at home.    Noah Lane will benefit from continued skilled PT to address ongoing ROM and strength deficits to improve mobility and activity tolerance with decreased pain interference.    EVAL: Noah Lane is a 68 y.o. male who was referred to physical therapy for evaluation and treatment for R sided LBP 2 lumbar DDD.  Patient also c/o intermittent L UE radiculopathy (vibration sensation).  Patient reports onset of R sided LBP pain beginning ~2 months ago w/o know MOI.  Triggers for LBP and R UE radiculopathy are unpredictable.  Patient has deficits in cervical and lumbar ROM, proximal B LE flexibility, L>R UE/scapular strength, B LE strength, core/postural weakness with abnormal posture, and abnormal muscle tension which are interfering with ADLs and are impacting quality of life.  On Modified Oswestry patient scored 18/50 demonstrating 36% or moderate disability.  On QuickDASH patient scored 29.5/100 demonstrating 29.5% disability.  He also notes impaired balance which will be formally assessed next visit.  Noah Lane will benefit from skilled PT to address above deficits to improve mobility and activity tolerance with decreased pain interference.     OBJECTIVE IMPAIRMENTS: decreased activity tolerance, decreased balance, decreased knowledge of condition, decreased mobility, difficulty walking, decreased ROM, decreased strength, increased fascial restrictions, impaired perceived functional ability, increased muscle spasms, impaired flexibility, impaired sensation, impaired UE functional use, improper body mechanics, postural dysfunction, and pain.   ACTIVITY LIMITATIONS: carrying, lifting, bending, sitting, standing, squatting, sleeping, stairs, transfers, bed mobility, reach over head, locomotion level, and  caring for others  PARTICIPATION LIMITATIONS: meal prep, cleaning, laundry, community activity, and yard work  PERSONAL FACTORS: Age, Fitness, Past/current experiences, Time since onset of injury/illness/exacerbation, and 3+ comorbidities: DM-II, obesity, elevated BP, GERD, B patellofemoral pain syndrome, B hip pain, h/o R RTC tendinitis, chronic B LBP w/o sciatica, scoliosis, diabetic peripheral neuropathy are also affecting patient's functional outcome.   REHAB POTENTIAL: Good  CLINICAL DECISION MAKING: Evolving/moderate complexity  EVALUATION COMPLEXITY: Moderate   GOALS: Goals reviewed with patient? Yes  SHORT TERM GOALS: Target date: 09/07/2023  Patient will be independent with initial HEP to improve outcomes and carryover.  Baseline:  08/08/23 - reviewed today Goal status: MET - 08/17/23  2.  Patient will report 25% improvement in low back pain to improve QOL. Baseline: 0/10 on eval, up to at least 7/10  Goal status: INITIAL - 08/17/23 - Twinges remain unpredictable but very short-lived  3.  Patient will report 25% reduction in frequency and intensity of L UE radiculopathy to improve QOL. Baseline: 3-5/10 on eval, up to 8-9/10 Goal status: IN PROGRESS - 08/17/23 - still come and goes   LONG TERM GOALS: Target date: 10/12/2023  Patient will be independent with ongoing/advanced HEP for self-management at home.  Baseline:  Goal status: INITIAL  2.  Patient will report 50-75% improvement in low back pain to improve QOL.  Baseline: 0/10 on eval, up to at least 7/10 Goal status: INITIAL  3.  Patient will report 50-75% reduction in frequency and intensity of L UE radiculopathy to improve QOL. Baseline: 3-5/10 on eval, up to 8-9/10 Goal  status: INITIAL   4.  Patient to demonstrate ability to achieve and maintain good spinal alignment/posturing and body mechanics needed for daily activities. Baseline: rounded shoulders, forward head, decreased lumbar lordosis, increased thoracic  kyphosis, and flexed trunk posture Goal status: INITIAL  5.  Patient will demonstrate functional pain free cervical and lumbar ROM to perform ADLs.   Baseline: Refer to above cervical and lumbar ROM table Goal status: INITIAL  6.  Patient will demonstrate improved scapular, B shoulder and B LE  strength to >/= 4+/5 for improved stability and ease of mobility. Baseline: Refer to above UE & LE MMT tables Goal status: INITIAL  7. Patient will report </= 24% on Modified Oswestry (MCID = 12%) to demonstrate improved functional ability with decreased pain interference. Baseline: 18 / 50 = 36.0 % Goal status: INITIAL  8.  Patient will report </= 16% on QuickDASH to demonstrate improved functional ability with decreased L UE radiculopathy interference. Baseline: 29.5 / 100 = 29.5 % Goal status: INITIAL   9.  Patient will improve FGA score by at least 4 points or to >/= 28/30 (baseline as of prior D/C from PT) to improve gait stability and reduce risk for falls.  Baseline: 22/30 - 08/10/23 Goal status: IN PROGRESS    PLAN:  PT FREQUENCY: 2x/week  PT DURATION: 8-10 weeks  PLANNED INTERVENTIONS: 97164- PT Re-evaluation, 97750- Physical Performance Testing, 97110-Therapeutic exercises, 97530- Therapeutic activity, 97112- Neuromuscular re-education, 97535- Self Care, 93267- Manual therapy, [phone]- Gait training, [phone]- Aquatic Therapy, [phone]- Electrical stimulation (unattended), [phone]- Ultrasound, [id number]- Traction (mechanical), [id number]- Ionotophoresis 4mg /ml Dexamethasone , 73419 (1-2 muscles), [id number] (3+ muscles)- Dry Needling, Patient/Family education, Balance training, Stair training, Taping, Joint mobilization, Spinal mobilization, Cryotherapy, and Moist heat  PLAN FOR NEXT SESSION: Progress core and postural flexibility and strengthening, updating HEP as indicated; MT +/- TPDN to address abnormal muscle tension   Peniel Biel W, PT 08/19/2023, 8:34 AM

## 2023-08-19 NOTE — Addendum Note (Signed)
 Addended by: Creed Dodrill on: 08/19/2023 08:24 AM   Modules accepted: Level of Service

## 2023-08-24 ENCOUNTER — Encounter: Payer: Self-pay | Admitting: Physical Therapy

## 2023-08-24 ENCOUNTER — Ambulatory Visit: Admitting: Physical Therapy

## 2023-08-24 DIAGNOSIS — M5459 Other low back pain: Secondary | ICD-10-CM | POA: Diagnosis not present

## 2023-08-24 DIAGNOSIS — M6281 Muscle weakness (generalized): Secondary | ICD-10-CM

## 2023-08-24 DIAGNOSIS — R2681 Unsteadiness on feet: Secondary | ICD-10-CM

## 2023-08-24 DIAGNOSIS — M5412 Radiculopathy, cervical region: Secondary | ICD-10-CM

## 2023-08-24 DIAGNOSIS — R293 Abnormal posture: Secondary | ICD-10-CM

## 2023-08-24 NOTE — Therapy (Addendum)
 OUTPATIENT PHYSICAL THERAPY TREATMENT   Patient Name: Noah Lane MRN: [id number] DOB:11-27-1955, 68 y.o., male Today's Date: 08/24/2023  END OF SESSION:  PT End of Session - 08/24/23 0803     Visit Number 6    Date for PT Re-Evaluation 10/12/23    Authorization Type Medicare & BCBS    Progress Note Due on Visit 10    PT Start Time 0803    PT Stop Time 0834   Pt requesting to leave early for a mandatory meeting   PT Time Calculation (min) 31 min    Activity Tolerance Patient tolerated treatment well    Behavior During Therapy [facility] for tasks assessed/performed              Past Medical History:  Diagnosis Date   Broken toe    Diabetes mellitus type 2 in obese    Mixed hyperlipidemia    Past Surgical History:  Procedure Laterality Date   COLON RESECTION N/A 04/27/2022   Procedure: LAPAROSCOPIC HAND ASSISTED RIGHT HEMI COLECTOMY, POSSIBLE OPEN;  Surgeon: Dasie Leonor CROME, MD;  Location: [facility];  Service: General;  Laterality: N/A;   COLONOSCOPY     HERNIA REPAIR  2012   Patient Active Problem List   Diagnosis Date Noted   Elevated blood pressure reading 05/04/2022   Colon adenocarcinoma (HCC) 05/04/2022   Gastroesophageal reflux disease 04/29/2022   Ileus, postoperative (HCC) 04/29/2022   Hypokalemia 04/28/2022   Hyperlipidemia 04/28/2022   Colonic obstruction (HCC) 04/25/2022   Hyponatremia 04/25/2022   OSA on CPAP 12/03/2021   Obesity (BMI 30.0-34.9) 12/03/2021   Mixed hyperlipidemia 10/20/2021   Strain of lumbar region 10/16/2021   Strain of calf muscle 07/22/2021   Patellofemoral pain syndrome of both knees 02/24/2021   Rotator cuff tendinitis, right 12/28/2019   Hip pain, bilateral 05/28/2019   Chronic bilateral low back pain without sciatica 05/28/2019   Type 2 diabetes mellitus with obesity (HCC) 05/28/2019   Erectile dysfunction 05/28/2019   Pain and swelling of left knee 03/12/2019   Lateral epicondylitis, right elbow 03/15/2017   Diverticulosis  of large intestine without hemorrhage 02/25/2014    PCP: Frann Mabel Mt, DO   REFERRING PROVIDER: Arvell Evalene SAUNDERS, DO   REFERRING DIAG: [phone] (ICD-10-CM) - Degeneration of intervertebral disc of lumbar region, unspecified whether pain present   THERAPY DIAG:  Other low back pain  Radiculopathy, cervical region  Muscle weakness (generalized)  Abnormal posture  Unsteadiness on feet  RATIONALE FOR EVALUATION AND TREATMENT: Rehabilitation  ONSET DATE: early April 2025  NEXT MD VISIT: None scheduled with referring provider, 08/19/23 with PCP   SUBJECTIVE:  SUBJECTIVE STATEMENT: Pt reports he has not had a back twinge in the last 2 days.  By the evenings, he feels like he has no legs due to crampy feeling.   EVAL:  Pt reports he started getting LBP, mostly on R side, ~2 months ago w/o known MOI.  Pain first noted while rolling over in bed.  Pain comes in strikes w/o predictable trigger.  Pain is momentary lasting a few minutes when present.  He also reports recent onset of L UE numbness and tingling that he says Dr. Frann recommended he mention to PT.  UE radiculopathy also unpredictable in trigger and varies in duration.  He notes constant stiffness in neck, but denies pain.  He also reports balance issues, esp initially on standing, which he attributes to a weak core and his neuropathy.  PAIN: Are you having pain? No and Yes: NPRS scale: 0/10   Pain location: R low back  Pain description: sharp, short-lived twinge  Aggravating factors: unpredictable  Relieving factors: remain still until pain resolves   Are you having pain? Yes: NPRS scale: comes and goes at all types of intensities  Pain location: L UE down to radial side of hand, occasional in thumb  Pain  description: vibration (reminds him of a TENS unit), stiffness in neck  Aggravating factors: unpredictable  Relieving factors: heating pads, stretches   PERTINENT HISTORY:  DM-II, obesity, elevated BP, GERD, B patellofemoral pain syndrome, B hip pain, h/o R RTC tendinitis, chronic B LBP w/o sciatica, scoliosis, diabetic peripheral neuropathy  PRECAUTIONS: None  RED FLAGS: None  WEIGHT BEARING RESTRICTIONS: No  FALLS:  Has patient fallen in last 6 months? 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 I would like the shoulder thing to go away. Better balance.   OBJECTIVE: (objective measures completed at initial evaluation unless otherwise dated)  DIAGNOSTIC FINDINGS:  10/16/21 - DG lumbar spine IMPRESSION: No acute fracture or subluxation of the lumbar spine.  PATIENT SURVEYS:  Modified Oswestry 18 / 50 = 36.0 %, moderate disability   Quick Dash 29.5 / 100 = 29.5 %  COGNITION:  Overall cognitive status: History of cognitive impairments - at baseline (mostly STM memory recall)    SENSATION: WFL B peripheral neuropathy in feet  POSTURE:  rounded shoulders, forward head, decreased lumbar lordosis, increased thoracic kyphosis, and flexed trunk   PALPATION: Increased muscle tension in L>R UT and LS as well as R>L lumbar paraspinals and glutes/piriformis  CERVICAL ROM:   Active ROM Eval  Flexion 49  Extension 42  Right lateral flexion 24  Left lateral flexion 16  Right  rotation 51  Left rotation 52   (Blank rows = not tested)  UPPER EXTREMITY MMT:  MMT Right eval Left eval  Shoulder flexion 4+ 4-  Shoulder extension 4+ 4+  Shoulder abduction 4+ 4  Shoulder adduction    Shoulder internal rotation 4+ 4+  Shoulder external rotation 4 4  Middle trapezius 4- 4-  Lower trapezius 3+ 3+  Grip strength     (Blank rows = not tested)   LUMBAR ROM:   Active  Eval  Flexion Hands to mid shins  Extension 25% limited  Right lateral flexion Hand to femoral condyle - tight  Left lateral flexion Hand to lateral knee - twinge in R low back  Right rotation WFL  Left rotation WFL  (Blank rows = not tested)  MUSCLE LENGTH: Hamstrings: mod/severe tight L>R ITB: mod/severe tight B Piriformis: mild/mod tight R>L Hip flexors: mod tight B Quads: mod tight R>L  LOWER EXTREMITY ROM:    Grossly WFL  LOWER EXTREMITY MMT:    MMT Right eval Left eval  Hip flexion 4 4  Hip extension 4- 4-  Hip abduction 4- 4-  Hip adduction 4- 3+  Hip internal rotation 5 4+  Hip external rotation 4- 3+  Knee flexion 5 4+  Knee extension 5 5  Ankle dorsiflexion 4+ 4+  Ankle plantarflexion    Ankle inversion    Ankle eversion     (Blank rows = not tested)  LUMBAR SPECIAL TESTS:  Straight leg raise test: Negative and Slump test: Negative  FUNCTIONAL TESTS: (08/10/23) 5 times sit to stand: 15.75 sec Functional gait assessment: 22/30; indicated medium risk for falls Berg balance scale: 50/56; indicates moderate (>50%) risk for falls    Scores as of D/C from PT in July 2024: Berg: 54/56 FGA: 28/30   TODAY'S TREATMENT:   08/24/2023 THERAPEUTIC EXERCISE: To improve strength and endurance.  Demonstration, verbal and tactile cues throughout for technique.  Mod Thomas quad/hip flexor stretch with strap 2 x 30 Prone quad stretch with strap and towel roll under knee 2 x 30 Seated GTB hamstring curl 2 x 10, cues to slow pace with ~3 pause before starting slow eccentric  release Seated GTB knee extension 2 x 10   08/19/23 Nustep level 5 x 6 minutes Rows 25# cues for posture Leg curls 25# 2x10, cues to go slow Leg extension 10# 2x10 cues for TKE and VMO  Feet on ball K2C, rotation, small bridge, isometric abs requires a lot of cues for form and had a lot of questions regarding how to use Passive LE stretches HS and piriformis Looked at 65 and 75cm ball at desk for posture as he had questions about it and form Red tband ER   08/17/2023  THERAPEUTIC EXERCISE: To improve strength and endurance.  Demonstration, verbal and tactile cues throughout for technique.  NuStep - L5 x 6 min - UE/LE  NEUROMUSCULAR RE-EDUCATION: To improve balance, coordination, kinesthesia, posture, proprioception, and reduce fall risk. Standing TrA + GTB scap retraction with B shoulder rows 2 x 10 - cues to avoid upward rotation of elbows Standing TrA + GTB scap retraction with B shoulder extension 2 x 10 - cues to avoid shoulder shrug Standing TrA + GTB scap retraction with shoulder horiz ABD 2 x 10 - standing with back along door frame to facilitate improved posture and scapular activation Standing TrA + GTB scap retraction with shoulder ER 2 x 10 - standing with back along door frame to facilitate improved posture and scapular activation Standing TrA + GTB pallof press x 10 bil SLS with TrA isometric + RTB 4-way SLR x 10 bil   08/10/2023 THERAPEUTIC EXERCISE: To improve strength and endurance.  Demonstration, verbal and tactile cues throughout for technique.  NuStep - L4 x 6 min - UE/LE  PHYSICAL PERFORMANCE TEST or MEASUREMENT: 5xSTS = 15.75 sec; >15 sec indicates risk for  recurrent falls Berg = 50/56; indicates moderate (>50%) risk for falls FGA = 22/30; indicates medium risk for falls  Berg Balance Test   Sit to Stand Able to stand without using hands and stabilize independently    Standing Unsupported Able to stand safely 2 minutes    Sitting with Back Unsupported but  Feet Supported on Floor or Stool Able to sit safely and securely 2 minutes    Stand to Sit Sits safely with minimal use of hands    Transfers Able to transfer safely, minor use of hands    Standing Unsupported with Eyes Closed Able to stand 10 seconds with supervision    Standing Unsupported with Feet Together Able to place feet together independently and stand 1 minute safely    From Standing, Reach Forward with Outstretched Arm Can reach forward >12 cm safely (5)    From Standing Position, Pick up Object from Floor Able to pick up shoe safely and easily    From Standing Position, Turn to Look Behind Over each Shoulder Looks behind from both sides and weight shifts well    Turn 360 Degrees Able to turn 360 degrees safely in 4 seconds or less    Standing Unsupported, Alternately Place Feet on Step/Stool Able to stand independently and safely and complete 8 steps in 20 seconds    Standing Unsupported, One Foot in Front Able to plae foot ahead of the other independently and hold 30 seconds    Standing on One Leg Tries to lift leg/unable to hold 3 seconds but remains standing independently    Total Score 50    Berg comment: 46-51 moderate (>50%)      Functional Gait  Assessment   Gait Level Surface Walks 20 ft in less than 7 sec but greater than 5.5 sec, uses assistive device, slower speed, mild gait deviations, or deviates 6-10 in outside of the 12 in walkway width.    Change in Gait Speed Able to smoothly change walking speed without loss of balance or gait deviation. Deviate no more than 6 in outside of the 12 in walkway width.    Gait with Horizontal Head Turns Performs head turns smoothly with no change in gait. Deviates no more than 6 in outside 12 in walkway width    Gait with Vertical Head Turns Performs head turns with no change in gait. Deviates no more than 6 in outside 12 in walkway width.    Gait and Pivot Turn Pivot turns safely within 3 sec and stops quickly with no loss of balance.     Step Over Obstacle Is able to step over one shoe box (4.5 in total height) without changing gait speed. No evidence of imbalance.    Gait with Narrow Base of Support Ambulates 4-7 steps.    Gait with Eyes Closed Walks 20 ft, slow speed, abnormal gait pattern, evidence for imbalance, deviates 10-15 in outside 12 in walkway width. Requires more than 9 sec to ambulate 20 ft.    Ambulating Backwards Walks 20 ft, slow speed, abnormal gait pattern, evidence for imbalance, deviates 10-15 in outside 12 in walkway width.    Steps Alternating feet, no rail.    Total Score 22    FGA comment: 19-24 = medium risk fall      NEUROMUSCULAR RE-EDUCATION: To improve coordination, kinesthesia, and posture.  Hooklying TrA + GTB shoulder horiz ABD 10 x 5 Hooklying TrA + alt GTB hip ABD/ER bent knee fallout 10 x 5   08/08/2023  THERAPEUTIC EXERCISE: To improve strength, endurance, ROM, and flexibility.  Demonstration, verbal and tactile cues throughout for technique.  Rec Bike - L2 x 6 min Doorway pec stretch at 90 ABD 2 x 30  Seated scapular retraction +ER 10 x 3-5 Supine HS stretch with strap 3 x 30 bil Supine cross-body ITB stretch with strap 3 x 30 bil Supine gluteus stretch 3 x 30 bil Hooklying KTOS piriformis stretch 3 x 30 bil Supine LTR 5 x 10 Hooklying hip ADD isometric with ball 10 x 5  NEUROMUSCULAR RE-EDUCATION: To improve coordination, kinesthesia, and posture.  Hooklying TrA + GTB shoulder horiz ABD 10 x 5 Hooklying TrA + GTB shoulder ER 10 x 5 Hooklying TrA + alt GTB hip ABD/ER bent knee fallout 10 x 5   08/03/2023  SELF CARE:  Reviewed eval findings and role of PT in addressing identified deficits as well as instruction in initial HEP (see below).    PATIENT EDUCATION:  Education details: HEP progression  Person educated: Patient Education method: Explanation, Demonstration, Verbal cues, Handouts, and MedBridgeGO app updated Education comprehension: verbalized  understanding, returned demonstration, verbal cues required, and needs further education  HOME EXERCISE PROGRAM: *Pt has MedBridgeGO access but also prefers printed handouts.  Access Code: [email or web address] URL: [email or web address] Date: 08/24/2023 Prepared by: Noah Lane  Exercises - Supine Hamstring Stretch with Strap  - 1-2 x daily - 7 x weekly - 3 reps - 30 sec hold - Supine Iliotibial Band Stretch with Strap  - 1-2 x daily - 7 x weekly - 3 reps - 30 sec hold - Prone Hip Flexor & Quad Stretch with Strap  - 1-2 x daily - 7 x weekly - 3 reps - 30 sec hold - Hip Flexor Stretch with Strap on Table  - 1-2 x daily - 7 x weekly - 3 reps - 30 sec hold - Supine Gluteus Stretch  - 1-2 x daily - 7 x weekly - 3 reps - 30 sec hold - Supine Piriformis Stretch with Foot on Ground  - 1-2 x daily - 7 x weekly - 3 reps - 30 sec hold - Supine Lower Trunk Rotation  - 1-2 x daily - 7 x weekly - 2 sets - 5 reps - 10 sec hold - Supine Hip Adduction Isometric with Ball  - 1-2 x daily - 7 x weekly - 2 sets - 10 reps - 5 sec hold - Doorway Pec Stretch at 90 Degrees Abduction  - 1-2 x daily - 7 x weekly - 3 reps - 30 sec hold - Seated Scapular Retraction with External Rotation  - 1-2 x daily - 7 x weekly - 2 sets - 10 reps - 3-5 sec hold - Hooklying Single Leg Bent Knee Fallouts with Resistance  - 1 x daily - 3 x weekly - 2 sets - 10 reps - 3 sec hold - Supine Shoulder Horizontal Abduction with Resistance  - 1 x daily - 3 x weekly - 2 sets - 10 reps - 3 sec hold - Standing Shoulder Row with Anchored Resistance  - 1 x daily - 3 x weekly - 2 sets - 10 reps - 5 sec hold - Scapular Retraction with Resistance Advanced  - 1 x daily - 3 x weekly - 2 sets - 10 reps - 5 sec hold - Standing Shoulder Horizontal Abduction with Resistance  - 1 x daily - 3 x weekly - 2 sets - 10 reps - 3 sec hold - Shoulder External Rotation and  Scapular Retraction with Resistance  - 1 x daily - 3 x weekly - 2 sets - 10 reps - 3-5 sec  hold - Standing Anti-Rotation Press with Anchored Resistance  - 1 x daily - 3 x weekly - 2 sets - 10 reps - 3 sec hold - Standing Hip Flexion with Anchored Resistance and Chair Support  - 1 x daily - 3 x weekly - 2 sets - 10 reps - 3 sec hold - Standing Hip Adduction with Resistance  - 1 x daily - 3 x weekly - 2 sets - 10 reps - 3 sec hold - Standing Hip Extension with Resistance  - 1 x daily - 3 x weekly - 2 sets - 10 reps - 3 sec hold - Standing Hip Abduction with Resistance  - 1 x daily - 3 x weekly - 2 sets - 10 reps - 3 sec hold - Seated Hamstring Curl with Anchored Resistance  - 1 x daily - 3 x weekly - 2 sets - 10 reps - 3-5 sec hold - Sitting Knee Extension with Resistance  - 1 x daily - 3 x weekly - 2 sets - 10 reps - 3-5 sec hold   ASSESSMENT:  CLINICAL IMPRESSION: Noah Lane reports feeling of cramping sensation in quads with activity, esp later in the day, therefore provided instruction in quad stretches to add to his home routine at his request.  He also requested instruction in ways to simulate the weight machine exercises from last visit with his therabands at home - verbally discussed band rows and provided instruction in seated HS curls and knee extensions using GTB.  Session ended early at pt request as he needed to leave early for a meeting.  Noah Lane will benefit from continued skilled PT to address ongoing ROM and strength deficits to improve mobility and activity tolerance with decreased pain interference.    EVAL: Noah Lane is a 68 y.o. male who was referred to physical therapy for evaluation and treatment for R sided LBP 2 lumbar DDD.  Patient also c/o intermittent L UE radiculopathy (vibration sensation).  Patient reports onset of R sided LBP pain beginning ~2 months ago w/o know MOI.  Triggers for LBP and R UE radiculopathy are unpredictable.  Patient has deficits in cervical and lumbar ROM, proximal B LE flexibility, L>R UE/scapular strength, B LE strength, core/postural  weakness with abnormal posture, and abnormal muscle tension which are interfering with ADLs and are impacting quality of life.  On Modified Oswestry patient scored 18/50 demonstrating 36% or moderate disability.  On QuickDASH patient scored 29.5/100 demonstrating 29.5% disability.  He also notes impaired balance which will be formally assessed next visit.  Noah Lane will benefit from skilled PT to address above deficits to improve mobility and activity tolerance with decreased pain interference.     OBJECTIVE IMPAIRMENTS: decreased activity tolerance, decreased balance, decreased knowledge of condition, decreased mobility, difficulty walking, decreased ROM, decreased strength, increased fascial restrictions, impaired perceived functional ability, increased muscle spasms, impaired flexibility, impaired sensation, impaired UE functional use, improper body mechanics, postural dysfunction, and pain.   ACTIVITY LIMITATIONS: carrying, lifting, bending, sitting, standing, squatting, sleeping, stairs, transfers, bed mobility, reach over head, locomotion level, and caring for others  PARTICIPATION LIMITATIONS: meal prep, cleaning, laundry, community activity, and yard work  PERSONAL FACTORS: Age, Fitness, Past/current experiences, Time since onset of injury/illness/exacerbation, and 3+ comorbidities: DM-II, obesity, elevated BP, GERD, B patellofemoral pain syndrome, B hip pain, h/o R RTC tendinitis, chronic B LBP w/o sciatica, scoliosis,  diabetic peripheral neuropathy are also affecting patient's functional outcome.   REHAB POTENTIAL: Good  CLINICAL DECISION MAKING: Evolving/moderate complexity  EVALUATION COMPLEXITY: Moderate   GOALS: Goals reviewed with patient? Yes  SHORT TERM GOALS: Target date: 09/07/2023  Patient will be independent with initial HEP to improve outcomes and carryover.  Baseline:  08/08/23 - reviewed today Goal status: MET - 08/17/23  2.  Patient will report 25% improvement in low back  pain to improve QOL. Baseline: 0/10 on eval, up to at least 7/10  Goal status: IN PROGRESS - 08/17/23 - Twinges remain unpredictable but very short-lived  3.  Patient will report 25% reduction in frequency and intensity of L UE radiculopathy to improve QOL. Baseline: 3-5/10 on eval, up to 8-9/10 Goal status: IN PROGRESS - 08/17/23 - still come and goes   LONG TERM GOALS: Target date: 10/12/2023  Patient will be independent with ongoing/advanced HEP for self-management at home.  Baseline:  Goal status: INITIAL  2.  Patient will report 50-75% improvement in low back pain to improve QOL.  Baseline: 0/10 on eval, up to at least 7/10 Goal status: INITIAL  3.  Patient will report 50-75% reduction in frequency and intensity of L UE radiculopathy to improve QOL. Baseline: 3-5/10 on eval, up to 8-9/10 Goal status: INITIAL   4.  Patient to demonstrate ability to achieve and maintain good spinal alignment/posturing and body mechanics needed for daily activities. Baseline: rounded shoulders, forward head, decreased lumbar lordosis, increased thoracic kyphosis, and flexed trunk posture Goal status: INITIAL  5.  Patient will demonstrate functional pain free cervical and lumbar ROM to perform ADLs.   Baseline: Refer to above cervical and lumbar ROM table Goal status: INITIAL  6.  Patient will demonstrate improved scapular, B shoulder and B LE  strength to >/= 4+/5 for improved stability and ease of mobility. Baseline: Refer to above UE & LE MMT tables Goal status: INITIAL  7. Patient will report </= 24% on Modified Oswestry (MCID = 12%) to demonstrate improved functional ability with decreased pain interference. Baseline: 18 / 50 = 36.0 % Goal status: INITIAL  8.  Patient will report </= 16% on QuickDASH to demonstrate improved functional ability with decreased L UE radiculopathy interference. Baseline: 29.5 / 100 = 29.5 % Goal status: INITIAL   9.  Patient will improve FGA score by at  least 4 points or to >/= 28/30 (baseline as of prior D/C from PT) to improve gait stability and reduce risk for falls.  Baseline: 22/30 - 08/10/23 Goal status: IN PROGRESS    PLAN:  PT FREQUENCY: 2x/week  PT DURATION: 8-10 weeks  PLANNED INTERVENTIONS: [id number]- PT Re-evaluation, 97750- Physical Performance Testing, 97110-Therapeutic exercises, 97530- Therapeutic activity, 97112- Neuromuscular re-education, 97535- Self Care, 02859- Manual therapy, [phone]- Gait training, [phone]- Aquatic Therapy, [phone]- Electrical stimulation (unattended), [phone]- Ultrasound, [id number]- Traction (mechanical), [id number]- Ionotophoresis 4mg /ml Dexamethasone , [id number] (1-2 muscles), [id number] (3+ muscles)- Dry Needling, Patient/Family education, Balance training, Stair training, Taping, Joint mobilization, Spinal mobilization, Cryotherapy, and Moist heat  PLAN FOR NEXT SESSION: Progress core and postural flexibility and strengthening, updating HEP as indicated; MT +/- TPDN to address abnormal muscle tension   Noah CHRISTELLA Lane, PT 08/24/2023, 8:43 AM

## 2023-08-25 ENCOUNTER — Telehealth: Payer: Self-pay

## 2023-08-25 NOTE — Telephone Encounter (Signed)
 Patient assistance received , placed in fridge.  Patient notified

## 2023-08-26 ENCOUNTER — Ambulatory Visit

## 2023-08-26 DIAGNOSIS — M5459 Other low back pain: Secondary | ICD-10-CM

## 2023-08-26 DIAGNOSIS — M6281 Muscle weakness (generalized): Secondary | ICD-10-CM

## 2023-08-26 DIAGNOSIS — M5412 Radiculopathy, cervical region: Secondary | ICD-10-CM

## 2023-08-26 NOTE — Therapy (Signed)
 OUTPATIENT PHYSICAL THERAPY TREATMENT   Patient Name: Noah Lane MRN: [id number] DOB:[birth year], 68 y.o., male Today's Date: 08/26/2023  END OF SESSION:  PT End of Session - 08/26/23 0805     Visit Number 7    Date for PT Re-Evaluation 10/12/23    Authorization Type Medicare & BCBS    Progress Note Due on Visit 10    PT Start Time 0801    PT Stop Time 0850    PT Time Calculation (min) 49 min    Activity Tolerance Patient tolerated treatment well    Behavior During Therapy WFL for tasks assessed/performed               Past Medical History:  Diagnosis Date   Broken toe    Diabetes mellitus type 2 in obese    Mixed hyperlipidemia    Past Surgical History:  Procedure Laterality Date   COLON RESECTION N/A 04/27/2022   Procedure: LAPAROSCOPIC HAND ASSISTED RIGHT HEMI COLECTOMY, POSSIBLE OPEN;  Surgeon: Dasie Leonor CROME, MD;  Location: [facility];  Service: General;  Laterality: N/A;   COLONOSCOPY     HERNIA REPAIR  2012   Patient Active Problem List   Diagnosis Date Noted   Elevated blood pressure reading 05/04/2022   Colon adenocarcinoma (HCC) 05/04/2022   Gastroesophageal reflux disease 04/29/2022   Ileus, postoperative (HCC) 04/29/2022   Hypokalemia 04/28/2022   Hyperlipidemia 04/28/2022   Colonic obstruction (HCC) 04/25/2022   Hyponatremia 04/25/2022   OSA on CPAP 12/03/2021   Obesity (BMI 30.0-34.9) 12/03/2021   Mixed hyperlipidemia 10/20/2021   Strain of lumbar region 10/16/2021   Strain of calf muscle 07/22/2021   Patellofemoral pain syndrome of both knees 02/24/2021   Rotator cuff tendinitis, right 12/28/2019   Hip pain, bilateral 05/28/2019   Chronic bilateral low back pain without sciatica 05/28/2019   Type 2 diabetes mellitus with obesity (HCC) 05/28/2019   Erectile dysfunction 05/28/2019   Pain and swelling of left knee 03/12/2019   Lateral epicondylitis, right elbow 03/15/2017   Diverticulosis of large intestine without hemorrhage 02/25/2014     PCP: Frann Mabel Mt, DO   REFERRING PROVIDER: Arvell Evalene SAUNDERS, DO   REFERRING DIAG: [phone] (ICD-10-CM) - Degeneration of intervertebral disc of lumbar region, unspecified whether pain present   THERAPY DIAG:  Other low back pain  Radiculopathy, cervical region  Muscle weakness (generalized)  RATIONALE FOR EVALUATION AND TREATMENT: Rehabilitation  ONSET DATE: early April 2025  NEXT MD VISIT: None scheduled with referring provider, 08/19/23 with PCP   SUBJECTIVE:  SUBJECTIVE STATEMENT: Pt reports he has not had a back twinge in the last 2 days.  By the evenings, he feels like he has no legs due to crampy feeling.   EVAL:  Pt reports he started getting LBP, mostly on R side, ~2 months ago w/o known MOI.  Pain first noted while rolling over in bed.  Pain comes in strikes w/o predictable trigger.  Pain is momentary lasting a few minutes when present.  He also reports recent onset of L UE numbness and tingling that he says Dr. Frann recommended he mention to PT.  UE radiculopathy also unpredictable in trigger and varies in duration.  He notes constant stiffness in neck, but denies pain.  He also reports balance issues, esp initially on standing, which he attributes to a weak core and his neuropathy.  PAIN: Are you having pain? No and Yes: NPRS scale: 0/10   Pain location: R low back  Pain description: sharp, short-lived twinge  Aggravating factors: unpredictable  Relieving factors: remain still until pain resolves   Are you having pain? Yes: NPRS scale: comes and goes at all types of intensities  Pain location: L UE down to radial side of hand, occasional in thumb  Pain description: vibration (reminds him of a TENS unit), stiffness in neck  Aggravating factors:  unpredictable  Relieving factors: heating pads, stretches   PERTINENT HISTORY:  DM-II, obesity, elevated BP, GERD, B patellofemoral pain syndrome, B hip pain, h/o R RTC tendinitis, chronic B LBP w/o sciatica, scoliosis, diabetic peripheral neuropathy  PRECAUTIONS: None  RED FLAGS: None  WEIGHT BEARING RESTRICTIONS: No  FALLS:  Has patient fallen in last 6 months? 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 I would like the shoulder thing to go away. Better balance.   OBJECTIVE: (objective measures completed at initial evaluation unless otherwise dated)  DIAGNOSTIC FINDINGS:  10/16/21 - DG lumbar spine IMPRESSION: No acute fracture or subluxation of the lumbar spine.  PATIENT SURVEYS:  Modified Oswestry 18 / 50 = 36.0 %, moderate disability   Quick Dash 29.5 / 100 = 29.5 %  COGNITION:  Overall cognitive status: History of cognitive impairments - at baseline (mostly STM memory recall)    SENSATION: WFL B peripheral neuropathy in feet  POSTURE:  rounded shoulders, forward head, decreased lumbar lordosis, increased thoracic kyphosis, and flexed trunk   PALPATION: Increased muscle tension in L>R UT and LS as well as R>L lumbar paraspinals and glutes/piriformis  CERVICAL ROM:   Active ROM Eval  Flexion 49  Extension 42  Right lateral flexion 24  Left lateral flexion 16  Right rotation 51  Left rotation 52   (Blank rows = not tested)  UPPER EXTREMITY MMT:  MMT Right  eval Left eval  Shoulder flexion 4+ 4-  Shoulder extension 4+ 4+  Shoulder abduction 4+ 4  Shoulder adduction    Shoulder internal rotation 4+ 4+  Shoulder external rotation 4 4  Middle trapezius 4- 4-  Lower trapezius 3+ 3+  Grip strength     (Blank rows = not tested)   LUMBAR ROM:   Active  Eval  Flexion Hands to mid shins  Extension 25% limited  Right lateral flexion Hand to femoral condyle - tight  Left lateral flexion Hand to lateral knee - twinge in R low back  Right rotation WFL  Left rotation WFL  (Blank rows = not tested)  MUSCLE LENGTH: Hamstrings: mod/severe tight L>R ITB: mod/severe tight B Piriformis: mild/mod tight R>L Hip flexors: mod tight B Quads: mod tight R>L  LOWER EXTREMITY ROM:    Grossly WFL  LOWER EXTREMITY MMT:    MMT Right eval Left eval  Hip flexion 4 4  Hip extension 4- 4-  Hip abduction 4- 4-  Hip adduction 4- 3+  Hip internal rotation 5 4+  Hip external rotation 4- 3+  Knee flexion 5 4+  Knee extension 5 5  Ankle dorsiflexion 4+ 4+  Ankle plantarflexion    Ankle inversion    Ankle eversion     (Blank rows = not tested)  LUMBAR SPECIAL TESTS:  Straight leg raise test: Negative and Slump test: Negative  FUNCTIONAL TESTS: (08/10/23) 5 times sit to stand: 15.75 sec Functional gait assessment: 22/30; indicated medium risk for falls Berg balance scale: 50/56; indicates moderate (>50%) risk for falls    Scores as of D/C from PT in July 2024: Berg: 54/56 FGA: 28/30   TODAY'S TREATMENT:  08/26/23 Standing shoulder ext 25lb 2x15  Standing rows 25lb 2x15  Trunk rotation blue TB 2x15 Reverse chop blue TB x 15 B Supine DKTC orange pball x 10 Supine bridge orange pball 2x10 Supine KTOS and figure 4 stretch x 30 B S/L open book 5x5 B  08/24/2023 THERAPEUTIC EXERCISE: To improve strength and endurance.  Demonstration, verbal and tactile cues throughout for technique.  Mod Thomas quad/hip flexor stretch with strap 2 x 30 Prone  quad stretch with strap and towel roll under knee 2 x 30 Seated GTB hamstring curl 2 x 10, cues to slow pace with ~3 pause before starting slow eccentric release Seated GTB knee extension 2 x 10   08/19/23 Nustep level 5 x 6 minutes Rows 25# cues for posture Leg curls 25# 2x10, cues to go slow Leg extension 10# 2x10 cues for TKE and VMO  Feet on ball K2C, rotation, small bridge, isometric abs requires a lot of cues for form and had a lot of questions regarding how to use Passive LE stretches HS and piriformis Looked at 65 and 75cm ball at desk for posture as he had questions about it and form Red tband ER   08/17/2023  THERAPEUTIC EXERCISE: To improve strength and endurance.  Demonstration, verbal and tactile cues throughout for technique.  NuStep - L5 x 6 min - UE/LE  NEUROMUSCULAR RE-EDUCATION: To improve balance, coordination, kinesthesia, posture, proprioception, and reduce fall risk. Standing TrA + GTB scap retraction with B shoulder rows 2 x 10 - cues to avoid upward rotation of elbows Standing TrA + GTB scap retraction with B shoulder extension 2 x 10 - cues to avoid shoulder shrug Standing TrA + GTB scap retraction with shoulder horiz ABD 2 x 10 - standing with back along door frame to facilitate improved posture and scapular activation Standing TrA + GTB scap retraction with shoulder ER 2 x 10 - standing with back along door frame to facilitate improved posture and scapular activation Standing TrA + GTB pallof press x 10 bil SLS with TrA isometric +  RTB 4-way SLR x 10 bil   08/10/2023 THERAPEUTIC EXERCISE: To improve strength and endurance.  Demonstration, verbal and tactile cues throughout for technique.  NuStep - L4 x 6 min - UE/LE  PHYSICAL PERFORMANCE TEST or MEASUREMENT: 5xSTS = 15.75 sec; >15 sec indicates risk for recurrent falls Berg = 50/56; indicates moderate (>50%) risk for falls FGA = 22/30; indicates medium risk for falls  Berg Balance Test   Sit to Stand  Able to stand without using hands and stabilize independently    Standing Unsupported Able to stand safely 2 minutes    Sitting with Back Unsupported but Feet Supported on Floor or Stool Able to sit safely and securely 2 minutes    Stand to Sit Sits safely with minimal use of hands    Transfers Able to transfer safely, minor use of hands    Standing Unsupported with Eyes Closed Able to stand 10 seconds with supervision    Standing Unsupported with Feet Together Able to place feet together independently and stand 1 minute safely    From Standing, Reach Forward with Outstretched Arm Can reach forward >12 cm safely (5)    From Standing Position, Pick up Object from Floor Able to pick up shoe safely and easily    From Standing Position, Turn to Look Behind Over each Shoulder Looks behind from both sides and weight shifts well    Turn 360 Degrees Able to turn 360 degrees safely in 4 seconds or less    Standing Unsupported, Alternately Place Feet on Step/Stool Able to stand independently and safely and complete 8 steps in 20 seconds    Standing Unsupported, One Foot in Front Able to plae foot ahead of the other independently and hold 30 seconds    Standing on One Leg Tries to lift leg/unable to hold 3 seconds but remains standing independently    Total Score 50    Berg comment: 46-51 moderate (>50%)      Functional Gait  Assessment   Gait Level Surface Walks 20 ft in less than 7 sec but greater than 5.5 sec, uses assistive device, slower speed, mild gait deviations, or deviates 6-10 in outside of the 12 in walkway width.    Change in Gait Speed Able to smoothly change walking speed without loss of balance or gait deviation. Deviate no more than 6 in outside of the 12 in walkway width.    Gait with Horizontal Head Turns Performs head turns smoothly with no change in gait. Deviates no more than 6 in outside 12 in walkway width    Gait with Vertical Head Turns Performs head turns with no change in gait.  Deviates no more than 6 in outside 12 in walkway width.    Gait and Pivot Turn Pivot turns safely within 3 sec and stops quickly with no loss of balance.    Step Over Obstacle Is able to step over one shoe box (4.5 in total height) without changing gait speed. No evidence of imbalance.    Gait with Narrow Base of Support Ambulates 4-7 steps.    Gait with Eyes Closed Walks 20 ft, slow speed, abnormal gait pattern, evidence for imbalance, deviates 10-15 in outside 12 in walkway width. Requires more than 9 sec to ambulate 20 ft.    Ambulating Backwards Walks 20 ft, slow speed, abnormal gait pattern, evidence for imbalance, deviates 10-15 in outside 12 in walkway width.    Steps Alternating feet, no rail.    Total Score 22  FGA comment: 19-24 = medium risk fall      NEUROMUSCULAR RE-EDUCATION: To improve coordination, kinesthesia, and posture.  Hooklying TrA + GTB shoulder horiz ABD 10 x 5 Hooklying TrA + alt GTB hip ABD/ER bent knee fallout 10 x 5   08/08/2023  THERAPEUTIC EXERCISE: To improve strength, endurance, ROM, and flexibility.  Demonstration, verbal and tactile cues throughout for technique.  Rec Bike - L2 x 6 min Doorway pec stretch at 90 ABD 2 x 30  Seated scapular retraction +ER 10 x 3-5 Supine HS stretch with strap 3 x 30 bil Supine cross-body ITB stretch with strap 3 x 30 bil Supine gluteus stretch 3 x 30 bil Hooklying KTOS piriformis stretch 3 x 30 bil Supine LTR 5 x 10 Hooklying hip ADD isometric with ball 10 x 5  NEUROMUSCULAR RE-EDUCATION: To improve coordination, kinesthesia, and posture.  Hooklying TrA + GTB shoulder horiz ABD 10 x 5 Hooklying TrA + GTB shoulder ER 10 x 5 Hooklying TrA + alt GTB hip ABD/ER bent knee fallout 10 x 5   08/03/2023  SELF CARE:  Reviewed eval findings and role of PT in addressing identified deficits as well as instruction in initial HEP (see below).    PATIENT EDUCATION:  Education details: HEP progression  Person  educated: Patient Education method: Explanation, Demonstration, Verbal cues, Handouts, and MedBridge[employer] app updated Education comprehension: verbalized understanding, returned demonstration, verbal cues required, and needs further education  HOME EXERCISE PROGRAM: *Pt has MedBridgeGO access but also prefers printed handouts.   Access Code: [email or web address] URL: [email or web address] Date: 08/26/2023 Prepared by: Laria Grimmett  Exercises - Supine Hamstring Stretch with Strap  - 1-2 x daily - 7 x weekly - 3 reps - 30 sec hold - Supine Iliotibial Band Stretch with Strap  - 1-2 x daily - 7 x weekly - 3 reps - 30 sec hold - Prone Hip Flexor & Quad Stretch with Strap  - 1-2 x daily - 7 x weekly - 3 reps - 30 sec hold - Hip Flexor Stretch with Strap on Table  - 1-2 x daily - 7 x weekly - 3 reps - 30 sec hold - Supine Gluteus Stretch  - 1-2 x daily - 7 x weekly - 3 reps - 30 sec hold - Supine Piriformis Stretch with Foot on Ground  - 1-2 x daily - 7 x weekly - 3 reps - 30 sec hold - Supine Lower Trunk Rotation  - 1-2 x daily - 7 x weekly - 2 sets - 5 reps - 10 sec hold - Supine Hip Adduction Isometric with Ball  - 1-2 x daily - 7 x weekly - 2 sets - 10 reps - 5 sec hold - Doorway Pec Stretch at 90 Degrees Abduction  - 1-2 x daily - 7 x weekly - 3 reps - 30 sec hold - Seated Scapular Retraction with External Rotation  - 1-2 x daily - 7 x weekly - 2 sets - 10 reps - 3-5 sec hold - Hooklying Single Leg Bent Knee Fallouts with Resistance  - 1 x daily - 3 x weekly - 2 sets - 10 reps - 3 sec hold - Supine Shoulder Horizontal Abduction with Resistance  - 1 x daily - 3 x weekly - 2 sets - 10 reps - 3 sec hold - Standing Shoulder Row with Anchored Resistance  - 1 x daily - 3 x weekly - 2 sets - 10 reps - 5 sec hold - Scapular Retraction with Resistance Advanced  -  1 x daily - 3 x weekly - 2 sets - 10 reps - 5 sec hold - Standing Shoulder Horizontal Abduction with Resistance  - 1 x daily - 3 x weekly  - 2 sets - 10 reps - 3 sec hold - Shoulder External Rotation and Scapular Retraction with Resistance  - 1 x daily - 3 x weekly - 2 sets - 10 reps - 3-5 sec hold - Standing Anti-Rotation Press with Anchored Resistance  - 1 x daily - 3 x weekly - 2 sets - 10 reps - 3 sec hold - Standing Hip Flexion with Anchored Resistance and Chair Support  - 1 x daily - 3 x weekly - 2 sets - 10 reps - 3 sec hold - Standing Hip Adduction with Resistance  - 1 x daily - 3 x weekly - 2 sets - 10 reps - 3 sec hold - Standing Hip Extension with Resistance  - 1 x daily - 3 x weekly - 2 sets - 10 reps - 3 sec hold - Standing Hip Abduction with Resistance  - 1 x daily - 3 x weekly - 2 sets - 10 reps - 3 sec hold - Seated Hamstring Curl with Anchored Resistance  - 1 x daily - 3 x weekly - 2 sets - 10 reps - 3-5 sec hold - Sitting Knee Extension with Resistance  - 1 x daily - 3 x weekly - 2 sets - 10 reps - 3-5 sec hold - Reverse Chop with Resistance  - 1 x daily - 7 x weekly - 2 sets - 10 reps - Standing Trunk Rotation with Resistance  - 1 x daily - 7 x weekly - 2 sets - 10 reps - Sidelying Thoracic Rotation with Open Book  - 1 x daily - 7 x weekly - 2 sets - 10 reps   ASSESSMENT:  CLINICAL IMPRESSION: Progressed through scap stabilization along with hip/trunk mobility. Introduced more chop motions and rotations for the trunk to simulate golf swings. Pt responded well to treatment, will continue to progress as tolerated. Ayinde will benefit from continued skilled PT to address ongoing ROM and strength deficits to improve mobility and activity tolerance with decreased pain interference.    EVAL: Camarion Weier is a 68 y.o. male who was referred to physical therapy for evaluation and treatment for R sided LBP 2 lumbar DDD.  Patient also c/o intermittent L UE radiculopathy (vibration sensation).  Patient reports onset of R sided LBP pain beginning ~2 months ago w/o know MOI.  Triggers for LBP and R UE radiculopathy are  unpredictable.  Patient has deficits in cervical and lumbar ROM, proximal B LE flexibility, L>R UE/scapular strength, B LE strength, core/postural weakness with abnormal posture, and abnormal muscle tension which are interfering with ADLs and are impacting quality of life.  On Modified Oswestry patient scored 18/50 demonstrating 36% or moderate disability.  On QuickDASH patient scored 29.5/100 demonstrating 29.5% disability.  He also notes impaired balance which will be formally assessed next visit.  Ryelan will benefit from skilled PT to address above deficits to improve mobility and activity tolerance with decreased pain interference.     OBJECTIVE IMPAIRMENTS: decreased activity tolerance, decreased balance, decreased knowledge of condition, decreased mobility, difficulty walking, decreased ROM, decreased strength, increased fascial restrictions, impaired perceived functional ability, increased muscle spasms, impaired flexibility, impaired sensation, impaired UE functional use, improper body mechanics, postural dysfunction, and pain.   ACTIVITY LIMITATIONS: carrying, lifting, bending, sitting, standing, squatting, sleeping, stairs, transfers, bed mobility,  reach over head, locomotion level, and caring for others  PARTICIPATION LIMITATIONS: meal prep, cleaning, laundry, community activity, and yard work  PERSONAL FACTORS: Age, Fitness, Past/current experiences, Time since onset of injury/illness/exacerbation, and 3+ comorbidities: DM-II, obesity, elevated BP, GERD, B patellofemoral pain syndrome, B hip pain, h/o R RTC tendinitis, chronic B LBP w/o sciatica, scoliosis, diabetic peripheral neuropathy are also affecting patient's functional outcome.   REHAB POTENTIAL: Good  CLINICAL DECISION MAKING: Evolving/moderate complexity  EVALUATION COMPLEXITY: Moderate   GOALS: Goals reviewed with patient? Yes  SHORT TERM GOALS: Target date: 09/07/2023  Patient will be independent with initial HEP to  improve outcomes and carryover.  Baseline:  08/08/23 - reviewed today Goal status: MET - 08/17/23  2.  Patient will report 25% improvement in low back pain to improve QOL. Baseline: 0/10 on eval, up to at least 7/10  Goal status: IN PROGRESS - 08/17/23 - Twinges remain unpredictable but very short-lived  3.  Patient will report 25% reduction in frequency and intensity of L UE radiculopathy to improve QOL. Baseline: 3-5/10 on eval, up to 8-9/10 Goal status: IN PROGRESS - 08/17/23 - still come and goes   LONG TERM GOALS: Target date: 10/12/2023  Patient will be independent with ongoing/advanced HEP for self-management at home.  Baseline:  Goal status: INITIAL  2.  Patient will report 50-75% improvement in low back pain to improve QOL.  Baseline: 0/10 on eval, up to at least 7/10 Goal status: INITIAL  3.  Patient will report 50-75% reduction in frequency and intensity of L UE radiculopathy to improve QOL. Baseline: 3-5/10 on eval, up to 8-9/10 Goal status: INITIAL   4.  Patient to demonstrate ability to achieve and maintain good spinal alignment/posturing and body mechanics needed for daily activities. Baseline: rounded shoulders, forward head, decreased lumbar lordosis, increased thoracic kyphosis, and flexed trunk posture Goal status: INITIAL  5.  Patient will demonstrate functional pain free cervical and lumbar ROM to perform ADLs.   Baseline: Refer to above cervical and lumbar ROM table Goal status: INITIAL  6.  Patient will demonstrate improved scapular, B shoulder and B LE  strength to >/= 4+/5 for improved stability and ease of mobility. Baseline: Refer to above UE & LE MMT tables Goal status: INITIAL  7. Patient will report </= 24% on Modified Oswestry (MCID = 12%) to demonstrate improved functional ability with decreased pain interference. Baseline: 18 / 50 = 36.0 % Goal status: INITIAL  8.  Patient will report </= 16% on QuickDASH to demonstrate improved functional  ability with decreased L UE radiculopathy interference. Baseline: 29.5 / 100 = 29.5 % Goal status: INITIAL   9.  Patient will improve FGA score by at least 4 points or to >/= 28/30 (baseline as of prior D/C from PT) to improve gait stability and reduce risk for falls.  Baseline: 22/30 - 08/10/23 Goal status: IN PROGRESS    PLAN:  PT FREQUENCY: 2x/week  PT DURATION: 8-10 weeks  PLANNED INTERVENTIONS: [id number]- PT Re-evaluation, 97750- Physical Performance Testing, 97110-Therapeutic exercises, 97530- Therapeutic activity, 97112- Neuromuscular re-education, 97535- Self Care, [id number]- Manual therapy, [phone]- Gait training, [phone]- Aquatic Therapy, [phone]- Electrical stimulation (unattended), [id number]- Ultrasound, [id number]- Traction (mechanical), [id number]- Ionotophoresis 4mg /ml Dexamethasone , [id number] (1-2 muscles), [id number] (3+ muscles)- Dry Needling, Patient/Family education, Balance training, Stair training, Taping, Joint mobilization, Spinal mobilization, Cryotherapy, and Moist heat  PLAN FOR NEXT SESSION: Progress core and postural flexibility and strengthening, updating HEP as indicated; MT +/- TPDN to address abnormal muscle tension  Annalyce Lanpher L Montague Corella, PTA 08/26/2023, 8:50 AM

## 2023-08-29 ENCOUNTER — Encounter: Payer: Self-pay | Admitting: Family Medicine

## 2023-08-30 NOTE — Telephone Encounter (Signed)
 Pt came in asking about what he should do with the medicine. Pt asks to be called or texted with response.

## 2023-08-31 ENCOUNTER — Encounter: Payer: Self-pay | Admitting: Physical Therapy

## 2023-08-31 ENCOUNTER — Telehealth: Payer: Self-pay | Admitting: Family Medicine

## 2023-08-31 ENCOUNTER — Encounter: Payer: Self-pay | Admitting: Pharmacist

## 2023-08-31 ENCOUNTER — Ambulatory Visit: Attending: Sports Medicine | Admitting: Physical Therapy

## 2023-08-31 DIAGNOSIS — R293 Abnormal posture: Secondary | ICD-10-CM | POA: Insufficient documentation

## 2023-08-31 DIAGNOSIS — M5412 Radiculopathy, cervical region: Secondary | ICD-10-CM | POA: Insufficient documentation

## 2023-08-31 DIAGNOSIS — M5459 Other low back pain: Secondary | ICD-10-CM | POA: Insufficient documentation

## 2023-08-31 DIAGNOSIS — M6281 Muscle weakness (generalized): Secondary | ICD-10-CM | POA: Insufficient documentation

## 2023-08-31 DIAGNOSIS — R2681 Unsteadiness on feet: Secondary | ICD-10-CM | POA: Insufficient documentation

## 2023-08-31 NOTE — Therapy (Signed)
 OUTPATIENT PHYSICAL THERAPY TREATMENT   Patient Name: Noah Lane MRN: [id number] DOB:1955/06/23, 68 y.o., male Today's Date: 08/31/2023  END OF SESSION:  PT End of Session - 08/31/23 0804     Visit Number 8    Date for PT Re-Evaluation 10/12/23    Authorization Type Medicare & BCBS    Progress Note Due on Visit 10    PT Start Time 0804    PT Stop Time 0847    PT Time Calculation (min) 43 min    Activity Tolerance Patient tolerated treatment well    Behavior During Therapy WFL for tasks assessed/performed                Past Medical History:  Diagnosis Date   Broken toe    Diabetes mellitus type 2 in obese    Mixed hyperlipidemia    Past Surgical History:  Procedure Laterality Date   COLON RESECTION N/A 04/27/2022   Procedure: LAPAROSCOPIC HAND ASSISTED RIGHT HEMI COLECTOMY, POSSIBLE OPEN;  Surgeon: Dasie Leonor CROME, MD;  Location: [facility];  Service: General;  Laterality: N/A;   COLONOSCOPY     HERNIA REPAIR  2012   Patient Active Problem List   Diagnosis Date Noted   Elevated blood pressure reading 05/04/2022   Colon adenocarcinoma (HCC) 05/04/2022   Gastroesophageal reflux disease 04/29/2022   Ileus, postoperative (HCC) 04/29/2022   Hypokalemia 04/28/2022   Hyperlipidemia 04/28/2022   Colonic obstruction (HCC) 04/25/2022   Hyponatremia 04/25/2022   OSA on CPAP 12/03/2021   Obesity (BMI 30.0-34.9) 12/03/2021   Mixed hyperlipidemia 10/20/2021   Strain of lumbar region 10/16/2021   Strain of calf muscle 07/22/2021   Patellofemoral pain syndrome of both knees 02/24/2021   Rotator cuff tendinitis, right 12/28/2019   Hip pain, bilateral 05/28/2019   Chronic bilateral low back pain without sciatica 05/28/2019   Type 2 diabetes mellitus with obesity (HCC) 05/28/2019   Erectile dysfunction 05/28/2019   Pain and swelling of left knee 03/12/2019   Lateral epicondylitis, right elbow 03/15/2017   Diverticulosis of large intestine without hemorrhage 02/25/2014     PCP: Frann Mabel Mt, DO   REFERRING PROVIDER: Arvell Evalene SAUNDERS, DO   REFERRING DIAG: [phone] (ICD-10-CM) - Degeneration of intervertebral disc of lumbar region, unspecified whether pain present   THERAPY DIAG:  Other low back pain  Radiculopathy, cervical region  Muscle weakness (generalized)  Abnormal posture  Unsteadiness on feet  RATIONALE FOR EVALUATION AND TREATMENT: Rehabilitation  ONSET DATE: early April 2025  NEXT MD VISIT: None scheduled with referring provider, 08/19/23 with PCP   SUBJECTIVE:  SUBJECTIVE STATEMENT: Pt reports recently he has noted that his legs feel like they are ready to give out by mid-afternoon.  He reports back soreness which he associates with the exercises but denies any recent sharp or twingy pain.   EVAL:  Pt reports he started getting LBP, mostly on R side, ~2 months ago w/o known MOI.  Pain first noted while rolling over in bed.  Pain comes in strikes w/o predictable trigger.  Pain is momentary lasting a few minutes when present.  He also reports recent onset of L UE numbness and tingling that he says Dr. Frann recommended he mention to PT.  UE radiculopathy also unpredictable in trigger and varies in duration.  He notes constant stiffness in neck, but denies pain.  He also reports balance issues, esp initially on standing, which he attributes to a weak core and his neuropathy.  PAIN: Are you having pain? No and Yes: NPRS scale: 0/10   Pain location: R low back  Pain description: exercise soreness  Aggravating factors: unpredictable  Relieving factors: remain still until pain resolves   Are you having pain? Yes: NPRS scale: comes and goes at all types of intensities  Pain location: L UE down to radial side of hand,  occasional in thumb  Pain description: vibration (reminds him of a TENS unit), stiffness in neck  Aggravating factors: unpredictable  Relieving factors: heating pads, stretches   PERTINENT HISTORY:  DM-II, obesity, elevated BP, GERD, B patellofemoral pain syndrome, B hip pain, h/o R RTC tendinitis, chronic B LBP w/o sciatica, scoliosis, diabetic peripheral neuropathy  PRECAUTIONS: None  RED FLAGS: None  WEIGHT BEARING RESTRICTIONS: No  FALLS:  Has patient fallen in last 6 months? 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 I would like the shoulder thing to go away. Better balance.   OBJECTIVE: (objective measures completed at initial evaluation unless otherwise dated)  DIAGNOSTIC FINDINGS:  10/16/21 - DG lumbar spine IMPRESSION: No acute fracture or subluxation of the lumbar spine.  PATIENT SURVEYS:  Modified Oswestry 18 / 50 = 36.0 %, moderate disability   Quick Dash 29.5 / 100 = 29.5 %  COGNITION:  Overall cognitive status: History of cognitive impairments - at baseline (mostly STM memory recall)    SENSATION: WFL B peripheral neuropathy in feet  POSTURE:  rounded shoulders, forward head, decreased lumbar lordosis, increased thoracic kyphosis, and flexed trunk   PALPATION: Increased muscle tension in L>R UT and LS as well as R>L lumbar paraspinals and glutes/piriformis  CERVICAL ROM:   Active ROM Eval  Flexion 49  Extension 42  Right lateral flexion 24  Left  lateral flexion 16  Right rotation 51  Left rotation 52   (Blank rows = not tested)  UPPER EXTREMITY MMT:  MMT Right eval Left eval  Shoulder flexion 4+ 4-  Shoulder extension 4+ 4+  Shoulder abduction 4+ 4  Shoulder adduction    Shoulder internal rotation 4+ 4+  Shoulder external rotation 4 4  Middle  trapezius 4- 4-  Lower trapezius 3+ 3+  Grip strength     (Blank rows = not tested)   LUMBAR ROM:   Active  Eval  Flexion Hands to mid shins  Extension 25% limited  Right lateral flexion Hand to femoral condyle - tight  Left lateral flexion Hand to lateral knee - twinge in R low back  Right rotation WFL  Left rotation WFL  (Blank rows = not tested)  MUSCLE LENGTH: Hamstrings: mod/severe tight L>R ITB: mod/severe tight B Piriformis: mild/mod tight R>L Hip flexors: mod tight B Quads: mod tight R>L  LOWER EXTREMITY ROM:    Grossly WFL  LOWER EXTREMITY MMT:    MMT Right eval Left eval  Hip flexion 4 4  Hip extension 4- 4-  Hip abduction 4- 4-  Hip adduction 4- 3+  Hip internal rotation 5 4+  Hip external rotation 4- 3+  Knee flexion 5 4+  Knee extension 5 5  Ankle dorsiflexion 4+ 4+  Ankle plantarflexion    Ankle inversion    Ankle eversion     (Blank rows = not tested)  LUMBAR SPECIAL TESTS:  Straight leg raise test: Negative and Slump test: Negative  FUNCTIONAL TESTS: (08/10/23) 5 times sit to stand: 15.75 sec Functional gait assessment: 22/30; indicated medium risk for falls Berg balance scale: 50/56; indicates moderate (>50%) risk for falls    Scores as of D/C from PT in July 2024: Berg: 54/56 FGA: 28/30   TODAY'S TREATMENT:   08/31/2023 THERAPEUTIC EXERCISE: To improve strength and endurance.  Demonstration, verbal and tactile cues throughout for technique.  NuStep - L6 x 8 min  NEUROMUSCULAR RE-EDUCATION: To improve coordination, kinesthesia, posture, and proprioception. Quadruped thread and reach 2 x 10 bil Quadruped hip ABD & ER 2 x 10 bil,  yoga block balanced on low back to promote neutral spine Prone from knees over green Pball - cervical retraction, thoracic extension + I's & T's x 10 each, cues for neutral cervical spine alignment avoiding flexion or extension   08/26/23 Standing shoulder ext 25lb 2x15  Standing rows 25lb 2x15  Trunk rotation blue TB 2x15 Reverse chop blue TB x 15 B Supine DKTC orange pball x 10 Supine bridge orange pball 2x10 Supine KTOS and figure 4 stretch x 30 B S/L open book 5x5 B   08/24/2023 THERAPEUTIC EXERCISE: To improve strength and endurance.  Demonstration, verbal and tactile cues throughout for technique.  Mod Thomas quad/hip flexor stretch with strap 2 x 30 Prone quad stretch with strap and towel roll under knee 2 x 30 Seated GTB hamstring curl 2 x 10, cues to slow pace with ~3 pause before starting slow eccentric release Seated GTB knee extension 2 x 10   08/19/23 Nustep level 5 x 6 minutes Rows 25# cues for posture Leg curls 25# 2x10, cues to go slow Leg extension 10# 2x10 cues for TKE and VMO  Feet on ball K2C, rotation, small bridge, isometric abs requires a lot of cues for form and had a lot of questions regarding how to use Passive LE stretches HS and piriformis Looked at 65 and 75cm ball at desk for posture as he had questions about it and form Red tband ER   08/17/2023  THERAPEUTIC EXERCISE: To improve strength and endurance.  Demonstration, verbal and tactile cues throughout for technique.  NuStep - L5 x 6 min - UE/LE  NEUROMUSCULAR RE-EDUCATION: To improve balance, coordination, kinesthesia, posture, proprioception, and reduce fall risk. Standing TrA + GTB scap  retraction with B shoulder rows 2 x 10 - cues to avoid upward rotation of elbows Standing TrA + GTB scap retraction with B shoulder extension 2 x 10 - cues to avoid shoulder shrug Standing TrA + GTB scap retraction with shoulder horiz ABD 2 x 10 - standing with back along door frame to facilitate improved  posture and scapular activation Standing TrA + GTB scap retraction with shoulder ER 2 x 10 - standing with back along door frame to facilitate improved posture and scapular activation Standing TrA + GTB pallof press x 10 bil SLS with TrA isometric + RTB 4-way SLR x 10 bil   08/10/2023 THERAPEUTIC EXERCISE: To improve strength and endurance.  Demonstration, verbal and tactile cues throughout for technique.  NuStep - L4 x 6 min - UE/LE  PHYSICAL PERFORMANCE TEST or MEASUREMENT: 5xSTS = 15.75 sec; >15 sec indicates risk for recurrent falls Berg = 50/56; indicates moderate (>50%) risk for falls FGA = 22/30; indicates medium risk for falls  Berg Balance Test   Sit to Stand Able to stand without using hands and stabilize independently    Standing Unsupported Able to stand safely 2 minutes    Sitting with Back Unsupported but Feet Supported on Floor or Stool Able to sit safely and securely 2 minutes    Stand to Sit Sits safely with minimal use of hands    Transfers Able to transfer safely, minor use of hands    Standing Unsupported with Eyes Closed Able to stand 10 seconds with supervision    Standing Unsupported with Feet Together Able to place feet together independently and stand 1 minute safely    From Standing, Reach Forward with Outstretched Arm Can reach forward >12 cm safely (5)    From Standing Position, Pick up Object from Floor Able to pick up shoe safely and easily    From Standing Position, Turn to Look Behind Over each Shoulder Looks behind from both sides and weight shifts well    Turn 360 Degrees Able to turn 360 degrees safely in 4 seconds or less    Standing Unsupported, Alternately Place Feet on Step/Stool Able to stand independently and safely and complete 8 steps in 20 seconds    Standing Unsupported, One Foot in Front Able to plae foot ahead of the other independently and hold 30 seconds    Standing on One Leg Tries to lift leg/unable to hold 3 seconds but remains standing  independently    Total Score 50    Berg comment: 46-51 moderate (>50%)      Functional Gait  Assessment   Gait Level Surface Walks 20 ft in less than 7 sec but greater than 5.5 sec, uses assistive device, slower speed, mild gait deviations, or deviates 6-10 in outside of the 12 in walkway width.    Change in Gait Speed Able to smoothly change walking speed without loss of balance or gait deviation. Deviate no more than 6 in outside of the 12 in walkway width.    Gait with Horizontal Head Turns Performs head turns smoothly with no change in gait. Deviates no more than 6 in outside 12 in walkway width    Gait with Vertical Head Turns Performs head turns with no change in gait. Deviates no more than 6 in outside 12 in walkway width.    Gait and Pivot Turn Pivot turns safely within 3 sec and stops quickly with no loss of balance.    Step Over Obstacle Is able to step  over one shoe box (4.5 in total height) without changing gait speed. No evidence of imbalance.    Gait with Narrow Base of Support Ambulates 4-7 steps.    Gait with Eyes Closed Walks 20 ft, slow speed, abnormal gait pattern, evidence for imbalance, deviates 10-15 in outside 12 in walkway width. Requires more than 9 sec to ambulate 20 ft.    Ambulating Backwards Walks 20 ft, slow speed, abnormal gait pattern, evidence for imbalance, deviates 10-15 in outside 12 in walkway width.    Steps Alternating feet, no rail.    Total Score 22    FGA comment: 19-24 = medium risk fall      NEUROMUSCULAR RE-EDUCATION: To improve coordination, kinesthesia, and posture.  Hooklying TrA + GTB shoulder horiz ABD 10 x 5 Hooklying TrA + alt GTB hip ABD/ER bent knee fallout 10 x 5   08/08/2023  THERAPEUTIC EXERCISE: To improve strength, endurance, ROM, and flexibility.  Demonstration, verbal and tactile cues throughout for technique.  Rec Bike - L2 x 6 min Doorway pec stretch at 90 ABD 2 x 30  Seated scapular retraction +ER 10 x 3-5 Supine HS  stretch with strap 3 x 30 bil Supine cross-body ITB stretch with strap 3 x 30 bil Supine gluteus stretch 3 x 30 bil Hooklying KTOS piriformis stretch 3 x 30 bil Supine LTR 5 x 10 Hooklying hip ADD isometric with ball 10 x 5  NEUROMUSCULAR RE-EDUCATION: To improve coordination, kinesthesia, and posture.  Hooklying TrA + GTB shoulder horiz ABD 10 x 5 Hooklying TrA + GTB shoulder ER 10 x 5 Hooklying TrA + alt GTB hip ABD/ER bent knee fallout 10 x 5   08/03/2023  SELF CARE:  Reviewed eval findings and role of PT in addressing identified deficits as well as instruction in initial HEP (see below).    PATIENT EDUCATION:  Education details: HEP progression  Person educated: Patient Education method: Explanation, Demonstration, Verbal cues, Handouts, and MedBridgeGO app updated Education comprehension: verbalized understanding, returned demonstration, verbal cues required, and needs further education  HOME EXERCISE PROGRAM: *Pt has MedBridgeGO access but also prefers printed handouts.  Access Code: [email or web address] URL: [email or web address] Date: 08/31/2023 Prepared by: Elijah Hidden  Exercises - Supine Hamstring Stretch with Strap  - 1-2 x daily - 7 x weekly - 3 reps - 30 sec hold - Supine Iliotibial Band Stretch with Strap  - 1-2 x daily - 7 x weekly - 3 reps - 30 sec hold - Prone Hip Flexor & Quad Stretch with Strap  - 1-2 x daily - 7 x weekly - 3 reps - 30 sec hold - Hip Flexor Stretch with Strap on Table  - 1-2 x daily - 7 x weekly - 3 reps - 30 sec hold - Supine Gluteus Stretch  - 1-2 x daily - 7 x weekly - 3 reps - 30 sec hold - Supine Piriformis Stretch with Foot on Ground  - 1-2 x daily - 7 x weekly - 3 reps - 30 sec hold - Supine Lower Trunk Rotation  - 1-2 x daily - 7 x weekly - 2 sets - 5 reps - 10 sec hold - Supine Hip Adduction Isometric with Ball  - 1-2 x daily - 7 x weekly - 2 sets - 10 reps - 5 sec hold - Doorway Pec Stretch at 90 Degrees Abduction  - 1-2  x daily - 7 x weekly - 3 reps - 30 sec hold - Seated Scapular Retraction with External Rotation  -  1-2 x daily - 7 x weekly - 2 sets - 10 reps - 3-5 sec hold - Hooklying Single Leg Bent Knee Fallouts with Resistance  - 1 x daily - 3 x weekly - 2 sets - 10 reps - 3 sec hold - Supine Shoulder Horizontal Abduction with Resistance  - 1 x daily - 3 x weekly - 2 sets - 10 reps - 3 sec hold - Standing Shoulder Row with Anchored Resistance  - 1 x daily - 3 x weekly - 2 sets - 10 reps - 5 sec hold - Scapular Retraction with Resistance Advanced  - 1 x daily - 3 x weekly - 2 sets - 10 reps - 5 sec hold - Standing Shoulder Horizontal Abduction with Resistance  - 1 x daily - 3 x weekly - 2 sets - 10 reps - 3 sec hold - Shoulder External Rotation and Scapular Retraction with Resistance  - 1 x daily - 3 x weekly - 2 sets - 10 reps - 3-5 sec hold - Standing Anti-Rotation Press with Anchored Resistance  - 1 x daily - 3 x weekly - 2 sets - 10 reps - 3 sec hold - Standing Hip Flexion with Anchored Resistance and Chair Support  - 1 x daily - 3 x weekly - 2 sets - 10 reps - 3 sec hold - Standing Hip Adduction with Resistance  - 1 x daily - 3 x weekly - 2 sets - 10 reps - 3 sec hold - Standing Hip Extension with Resistance  - 1 x daily - 3 x weekly - 2 sets - 10 reps - 3 sec hold - Standing Hip Abduction with Resistance  - 1 x daily - 3 x weekly - 2 sets - 10 reps - 3 sec hold - Seated Hamstring Curl with Anchored Resistance  - 1 x daily - 3 x weekly - 2 sets - 10 reps - 3-5 sec hold - Sitting Knee Extension with Resistance  - 1 x daily - 3 x weekly - 2 sets - 10 reps - 3-5 sec hold - Reverse Chop with Resistance  - 1 x daily - 3 x weekly - 2 sets - 10 reps - Standing Trunk Rotation with Resistance  - 1 x daily - 3 x weekly - 2 sets - 10 reps - Sidelying Thoracic Rotation with Open Book  - 1 x daily - 3 x weekly - 2 sets - 10 reps - Quadruped With Rotation: Thread The Needle  - 1 x daily - 3 x weekly - 2 sets - 10 reps  - 3 sec hold - Quadruped Hip Abduction and External Rotation  - 1 x daily - 3 x weekly - 2 sets - 10 reps - 3 sec hold - Prone Shoulder Extension on Swiss Ball  - 1 x daily - 3 x weekly - 2 sets - 10 reps - 3 sec hold - Prone Middle Trapezius Strengthening on Swiss Ball  - 1 x daily - 3 x weekly - 2 sets - 10 reps - 3 sec hold   ASSESSMENT:  CLINICAL IMPRESSION: Derwin reports 50% reduction in frequency and intensity of his low back pain but states UE radiculopathy essentially unchanged.  Continued to progress core stabilization and strengthening while incorporating trunk rotation to facilitate necessary movement for his golf swings.  He noted cervical muscle fatigue in quadruped, therefore introduced gravity resisted cervical and thoracic extension over Pball with scapular strengthening to improve cervical stability and upper back strength.  HEP handouts provided at patient request, however cautioned patient that we really need to work on streamlining the HEP exercise to identify the effective exercise as he has several exercises duplicating similar movement patterns in different positions.  Aristidis will benefit from continued skilled PT to address ongoing ROM and strength deficits to improve mobility and activity tolerance with decreased pain interference.    EVAL: Melburn Treiber is a 68 y.o. male who was referred to physical therapy for evaluation and treatment for R sided LBP 2 lumbar DDD.  Patient also c/o intermittent L UE radiculopathy (vibration sensation).  Patient reports onset of R sided LBP pain beginning ~2 months ago w/o know MOI.  Triggers for LBP and R UE radiculopathy are unpredictable.  Patient has deficits in cervical and lumbar ROM, proximal B LE flexibility, L>R UE/scapular strength, B LE strength, core/postural weakness with abnormal posture, and abnormal muscle tension which are interfering with ADLs and are impacting quality of life.  On Modified Oswestry patient scored 18/50  demonstrating 36% or moderate disability.  On QuickDASH patient scored 29.5/100 demonstrating 29.5% disability.  He also notes impaired balance which will be formally assessed next visit.  Riyad will benefit from skilled PT to address above deficits to improve mobility and activity tolerance with decreased pain interference.     OBJECTIVE IMPAIRMENTS: decreased activity tolerance, decreased balance, decreased knowledge of condition, decreased mobility, difficulty walking, decreased ROM, decreased strength, increased fascial restrictions, impaired perceived functional ability, increased muscle spasms, impaired flexibility, impaired sensation, impaired UE functional use, improper body mechanics, postural dysfunction, and pain.   ACTIVITY LIMITATIONS: carrying, lifting, bending, sitting, standing, squatting, sleeping, stairs, transfers, bed mobility, reach over head, locomotion level, and caring for others  PARTICIPATION LIMITATIONS: meal prep, cleaning, laundry, community activity, and yard work  PERSONAL FACTORS: Age, Fitness, Past/current experiences, Time since onset of injury/illness/exacerbation, and 3+ comorbidities: DM-II, obesity, elevated BP, GERD, B patellofemoral pain syndrome, B hip pain, h/o R RTC tendinitis, chronic B LBP w/o sciatica, scoliosis, diabetic peripheral neuropathy are also affecting patient's functional outcome.   REHAB POTENTIAL: Good  CLINICAL DECISION MAKING: Evolving/moderate complexity  EVALUATION COMPLEXITY: Moderate   GOALS: Goals reviewed with patient? Yes  SHORT TERM GOALS: Target date: 09/07/2023  Patient will be independent with initial HEP to improve outcomes and carryover.  Baseline:  08/08/23 - reviewed today Goal status: MET - 08/17/23  2.  Patient will report 25% improvement in low back pain to improve QOL. Baseline: 0/10 on eval, up to at least 7/10  08/17/23 - Twinges remain unpredictable but very short-lived Goal status: MET - 08/31/23 - pt reports  overall reduction in pain frequency and intensity by 50%   3.  Patient will report 25% reduction in frequency and intensity of L UE radiculopathy to improve QOL. Baseline: 3-5/10 on eval, up to 8-9/10 08/17/23 - still come and goes  Goal status: IN PROGRESS - 08/31/23 - buzzing essentially unchanged - still come and goes   LONG TERM GOALS: Target date: 10/12/2023  Patient will be independent with ongoing/advanced HEP for self-management at home.  Baseline:  Goal status: IN PROGRESS - 08/31/23 - met for current HEP, anticipate further updates  2.  Patient will report 50-75% improvement in low back pain to improve QOL.  Baseline: 0/10 on eval, up to at least 7/10 Goal status: IN PROGRESS - 08/31/23 - pt reports overall reduction in pain frequency and intensity by 50%   3.  Patient will report 50-75% reduction in frequency and intensity of  L UE radiculopathy to improve QOL. Baseline: 3-5/10 on eval, up to 8-9/10 Goal status: IN PROGRESS - 08/31/23 - buzzing essentially unchanged - still come and goes   4.  Patient to demonstrate ability to achieve and maintain good spinal alignment/posturing and body mechanics needed for daily activities. Baseline: rounded shoulders, forward head, decreased lumbar lordosis, increased thoracic kyphosis, and flexed trunk posture Goal status: INITIAL  5.  Patient will demonstrate functional pain free cervical and lumbar ROM to perform ADLs.   Baseline: Refer to above cervical and lumbar ROM table Goal status: INITIAL  6.  Patient will demonstrate improved scapular, B shoulder and B LE strength to >/= 4+/5 for improved stability and ease of mobility. Baseline: Refer to above UE & LE MMT tables Goal status: INITIAL  7. Patient will report </= 24% on Modified Oswestry (MCID = 12%) to demonstrate improved functional ability with decreased pain interference. Baseline: 18 / 50 = 36.0 % Goal status: INITIAL  8.  Patient will report </= 16% on QuickDASH to  demonstrate improved functional ability with decreased L UE radiculopathy interference. Baseline: 29.5 / 100 = 29.5 % Goal status: INITIAL   9.  Patient will improve FGA score by at least 4 points or to >/= 28/30 (baseline as of prior D/C from PT) to improve gait stability and reduce risk for falls.  Baseline: 22/30 - 08/10/23 Goal status: IN PROGRESS    PLAN:  PT FREQUENCY: 2x/week  PT DURATION: 8-10 weeks  PLANNED INTERVENTIONS: 02835- PT Re-evaluation, 97750- Physical Performance Testing, 97110-Therapeutic exercises, 97530- Therapeutic activity, 97112- Neuromuscular re-education, 97535- Self Care, 02859- Manual therapy, [phone]- Gait training, [phone]- Aquatic Therapy, [phone]- Electrical stimulation (unattended), [phone]- Ultrasound, [id number]- Traction (mechanical), [id number]- Ionotophoresis 4mg /ml Dexamethasone , [id number] (1-2 muscles), [id number] (3+ muscles)- Dry Needling, Patient/Family education, Balance training, Stair training, Taping, Joint mobilization, Spinal mobilization, Cryotherapy, and Moist heat  PLAN FOR NEXT SESSION: Initiate ROM and strength reassessment in prep for 10th visit PN; Work on streamlining/condensing HEP while continuing to progress core and postural flexibility and strengthening; MT +/- TPDN to address abnormal muscle tension   Elijah CHRISTELLA Hidden, PT 08/31/2023, 9:05 AM

## 2023-08-31 NOTE — Progress Notes (Signed)
   08/31/2023 Name: Noah Lane MRN: [id number] DOB: [birth year]  Chief Complaint  Patient presents with   Medication Management    Ozempic     Noah Lane is a 68 y.o. year old male. Completed a MyChart Visit today   They were referred to the pharmacist by their PCP for assistance in managing medication access.    Subjective: Patient was seen by PCP 08/19/2023 and instructed to increase Ozempic  to 1mg  weekly.  He has been receiving Ozempic  0.5mg  dose from [facility]. He received a delivery 08/29/2023 and it was the 0.5mg  dose.   Medication Access/Adherence  Current Pharmacy:  [facility] [id number] - [place], [place] - [address] [place] [address] Phone: [phone] Fax: [phone]   Patient reports affordability concerns with their medications: Yes  Patient reports access/transportation concerns to their pharmacy: No  Patient reports adherence concerns with their medications:  Yes  - needs updated dose of Ozempic  1mg     Objective:  Lab Results  Component Value Date   HGBA1C 7.1 (H) 08/19/2023    Lab Results  Component Value Date   CREATININE 1.16 08/19/2023   BUN 23 08/19/2023   NA 137 08/19/2023   K 4.3 08/19/2023   CL 102 08/19/2023   CO2 27 08/19/2023    Lab Results  Component Value Date   CHOL 136 08/19/2023   HDL 36.50 (L) 08/19/2023   LDLCALC 65 08/19/2023   LDLDIRECT 132.0 04/22/2022   TRIG 174.0 (H) 08/19/2023   CHOLHDL 4 08/19/2023    Current Outpatient Medications  Medication Instructions   atorvastatin  (LIPITOR) 20 mg, Oral, Daily   ciclopirox  (PENLAC ) 8 % solution Topical, Daily at bedtime, Apply over nail and surrounding skin. Apply daily over previous coat. After seven (7) days, may remove with alcohol and continue cycle.   Continuous Glucose Receiver (FREESTYLE LIBRE 2 READER) DEVI Change every 14 days   metFORMIN  (GLUCOPHAGE -XR) 500 mg, Oral, Daily with breakfast   olmesartan   (BENICAR ) 20 mg, Oral, Daily   PRESCRIPTION MEDICATION 4 mLs, As needed   Semaglutide  (1 MG/DOSE) 1 mg, Injection, Weekly      Assessment/Plan:   Medication Access:  - Patient can use 0.5mg  Ozempic  dose - injection 0.5mg  x 2 once weekly until new does arrives or he can pick up Rx that was sent to his pharmacy but it looks like he would need to meet $590 deductible first so cost will likely be prohibitive. Will send updated dose change form for Ozempic  1mg  to [facility].   - reviewed med adherence - he has filled several medications recently but refills were about 6 months after last refill. Will continue to monitor and discuss adherence with patient.   Follow Up Plan: 2 to 3 weeks to follow up Ozempic  patient assistance program and check medication adherece  Noah Lane, PharmD Clinical Pharmacist [facility]

## 2023-08-31 NOTE — Telephone Encounter (Signed)
 Please see mychart message - this has already been addressed.   All Conversations: Ozempic  (Newest Message First)            Tyric Rodeheaver to Me [facility]    08/31/23 10:08 AM Thank you.  Messages that only contain a simple Thank you are hidden in In Basket to save you clicks. Me to Jaelin Fackler TE    08/31/23  9:37 AM I have a phone visit set for 09/21/2023 at 9:45 - that is just so that I can follow up on the Ozempic  and make the shipment is either on the way or received.   Last read by Kayla Awkward at 10:08AM on 08/31/2023. Madix Blowe to Me [facility]    08/31/23  9:29 AM Thank you.  On Monday I picked up at [facility] what I thought was the new dose prescription.  It turned out to be the old dosage.  I can use it and administer two injections. Me to Maxxon Schwanke TE    08/31/23  9:12 AM Hi Mr. Geesey,  Anytime there is a dose change, a change in dose form has to be completed and sent to [facility] medication assistance program. Unfortunately this cannot be sent electronically like other prescriptions.  I will start the process of getting this form completed and sent to [facility]. It usually takes them 10 to 14 business days to process and ship the new dose.  You can still used the pens that you received - you would have to injection 0.5mg  twice to equal 1mg  dose. Not ideal but will allow you to use the product you have on hand.  It looks like one our medical assistants sent in a prescription for Ozempic  1mg  dose for you, though since you have a $590 deductible the cost will likely be high (at least $590 + $47 copay). However if you wish not to do 2 injections of 0.5mg , purchasing 1 month supply at the pharmacy is an option.    Please let me know if you have any questions and in the future if you have any Ozempic  dose changes reach out to me so we can complete updates to request new dosage.    Madelin Ray, PharmD Clinical Pharmacist [facility] [phone]  Last read by Kayla Awkward at 10:08AM on 08/31/2023.

## 2023-08-31 NOTE — Telephone Encounter (Signed)
 Pt came in and wants to know if he can return or get rid of the ozempic  he received earlier this week because its wrong dose. Pt wants thee old dose of ozempic  he used to receive. Please message or call pt.

## 2023-09-06 ENCOUNTER — Encounter: Payer: Self-pay | Admitting: Family Medicine

## 2023-09-07 ENCOUNTER — Ambulatory Visit

## 2023-09-07 DIAGNOSIS — M5412 Radiculopathy, cervical region: Secondary | ICD-10-CM

## 2023-09-07 DIAGNOSIS — M5459 Other low back pain: Secondary | ICD-10-CM

## 2023-09-07 DIAGNOSIS — M6281 Muscle weakness (generalized): Secondary | ICD-10-CM

## 2023-09-07 DIAGNOSIS — R2681 Unsteadiness on feet: Secondary | ICD-10-CM

## 2023-09-07 DIAGNOSIS — R293 Abnormal posture: Secondary | ICD-10-CM

## 2023-09-07 NOTE — Therapy (Signed)
 OUTPATIENT PHYSICAL THERAPY TREATMENT   Patient Name: Maksymilian Mabey MRN: [id number] DOB:02/20/1956, 68 y.o., male Today's Date: 09/07/2023  END OF SESSION:  PT End of Session - 09/07/23 0806     Visit Number 9    Date for PT Re-Evaluation 10/12/23    Authorization Type Medicare & BCBS    Progress Note Due on Visit 10    PT Start Time 0800    PT Stop Time 0844    PT Time Calculation (min) 44 min    Activity Tolerance Patient tolerated treatment well    Behavior During Therapy WFL for tasks assessed/performed                 Past Medical History:  Diagnosis Date   Broken toe    Diabetes mellitus type 2 in obese    Mixed hyperlipidemia    Past Surgical History:  Procedure Laterality Date   COLON RESECTION N/A 04/27/2022   Procedure: LAPAROSCOPIC HAND ASSISTED RIGHT HEMI COLECTOMY, POSSIBLE OPEN;  Surgeon: Dasie Leonor CROME, MD;  Location: [facility];  Service: General;  Laterality: N/A;   COLONOSCOPY     HERNIA REPAIR  2012   Patient Active Problem List   Diagnosis Date Noted   Elevated blood pressure reading 05/04/2022   Colon adenocarcinoma (HCC) 05/04/2022   Gastroesophageal reflux disease 04/29/2022   Ileus, postoperative (HCC) 04/29/2022   Hypokalemia 04/28/2022   Hyperlipidemia 04/28/2022   Colonic obstruction (HCC) 04/25/2022   Hyponatremia 04/25/2022   OSA on CPAP 12/03/2021   Obesity (BMI 30.0-34.9) 12/03/2021   Mixed hyperlipidemia 10/20/2021   Strain of lumbar region 10/16/2021   Strain of calf muscle 07/22/2021   Patellofemoral pain syndrome of both knees 02/24/2021   Rotator cuff tendinitis, right 12/28/2019   Hip pain, bilateral 05/28/2019   Chronic bilateral low back pain without sciatica 05/28/2019   Type 2 diabetes mellitus with obesity (HCC) 05/28/2019   Erectile dysfunction 05/28/2019   Pain and swelling of left knee 03/12/2019   Lateral epicondylitis, right elbow 03/15/2017   Diverticulosis of large intestine without hemorrhage 02/25/2014     PCP: Frann Mabel Mt, DO   REFERRING PROVIDER: Arvell Evalene SAUNDERS, DO   REFERRING DIAG: [id number] (ICD-10-CM) - Degeneration of intervertebral disc of lumbar region, unspecified whether pain present   THERAPY DIAG:  Other low back pain  Radiculopathy, cervical region  Muscle weakness (generalized)  Abnormal posture  Unsteadiness on feet  RATIONALE FOR EVALUATION AND TREATMENT: Rehabilitation  ONSET DATE: early April 2025  NEXT MD VISIT: None scheduled with referring provider, 08/19/23 with PCP   SUBJECTIVE:  SUBJECTIVE STATEMENT: Pt denies no recent changes.  EVAL:  Pt reports he started getting LBP, mostly on R side, ~2 months ago w/o known MOI.  Pain first noted while rolling over in bed.  Pain comes in strikes w/o predictable trigger.  Pain is momentary lasting a few minutes when present.  He also reports recent onset of L UE numbness and tingling that he says Dr. Frann recommended he mention to PT.  UE radiculopathy also unpredictable in trigger and varies in duration.  He notes constant stiffness in neck, but denies pain.  He also reports balance issues, esp initially on standing, which he attributes to a weak core and his neuropathy.  PAIN: Are you having pain? No and Yes: NPRS scale: 0/10   Pain location: R low back  Pain description: exercise soreness  Aggravating factors: unpredictable  Relieving factors: remain still until pain resolves   Are you having pain? Yes: NPRS scale: comes and goes at all types of intensities  Pain location: L UE down to radial side of hand, occasional in thumb  Pain description: vibration (reminds him of a TENS unit), stiffness in neck  Aggravating factors: unpredictable  Relieving factors: heating pads, stretches    PERTINENT HISTORY:  DM-II, obesity, elevated BP, GERD, B patellofemoral pain syndrome, B hip pain, h/o R RTC tendinitis, chronic B LBP w/o sciatica, scoliosis, diabetic peripheral neuropathy  PRECAUTIONS: None  RED FLAGS: None  WEIGHT BEARING RESTRICTIONS: No  FALLS:  Has patient fallen in last 6 months? 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 I would like the shoulder thing to go away. Better balance.   OBJECTIVE: (objective measures completed at initial evaluation unless otherwise dated)  DIAGNOSTIC FINDINGS:  10/16/21 - DG lumbar spine IMPRESSION: No acute fracture or subluxation of the lumbar spine.  PATIENT SURVEYS:  Modified Oswestry 18 / 50 = 36.0 %, moderate disability   Quick Dash 29.5 / 100 = 29.5 %  COGNITION:  Overall cognitive status: History of cognitive impairments - at baseline (mostly STM memory recall)    SENSATION: WFL B peripheral neuropathy in feet  POSTURE:  rounded shoulders, forward head, decreased lumbar lordosis, increased thoracic kyphosis, and flexed trunk   PALPATION: Increased muscle tension in L>R UT and LS as well as R>L lumbar paraspinals and glutes/piriformis  CERVICAL ROM:   Active ROM Eval  Flexion 49  Extension 42  Right lateral flexion 24  Left lateral flexion 16  Right rotation 51  Left rotation 52   (Blank rows = not tested)  UPPER EXTREMITY MMT:  MMT Right eval Left eval  Shoulder flexion 4+ 4-  Shoulder  extension 4+ 4+  Shoulder abduction 4+ 4  Shoulder adduction    Shoulder internal rotation 4+ 4+  Shoulder external rotation 4 4  Middle trapezius 4- 4-  Lower trapezius 3+ 3+  Grip strength     (Blank rows = not tested)   LUMBAR ROM:   Active  Eval  Flexion Hands to mid  shins  Extension 25% limited  Right lateral flexion Hand to femoral condyle - tight  Left lateral flexion Hand to lateral knee - twinge in R low back  Right rotation WFL  Left rotation WFL  (Blank rows = not tested)  MUSCLE LENGTH: Hamstrings: mod/severe tight L>R ITB: mod/severe tight B Piriformis: mild/mod tight R>L Hip flexors: mod tight B Quads: mod tight R>L  LOWER EXTREMITY ROM:    Grossly WFL  LOWER EXTREMITY MMT:    MMT Right eval Left eval  Hip flexion 4 4  Hip extension 4- 4-  Hip abduction 4- 4-  Hip adduction 4- 3+  Hip internal rotation 5 4+  Hip external rotation 4- 3+  Knee flexion 5 4+  Knee extension 5 5  Ankle dorsiflexion 4+ 4+  Ankle plantarflexion    Ankle inversion    Ankle eversion     (Blank rows = not tested)  LUMBAR SPECIAL TESTS:  Straight leg raise test: Negative and Slump test: Negative  FUNCTIONAL TESTS: (08/10/23) 5 times sit to stand: 15.75 sec Functional gait assessment: 22/30; indicated medium risk for falls Berg balance scale: 50/56; indicates moderate (>50%) risk for falls    Scores as of D/C from PT in July 2024: Berg: 54/56 FGA: 28/30   TODAY'S TREATMENT:  09/07/23 NEUROMUSCULAR RE-EDUCATION: To improve coordination, kinesthesia and proprioception.  Quadruped cat/cow x 10 Quadruped fire hydrants x 10 B- using hand grips Bird dog x 10 B Ab sets orange pball 10x5 LE pedals + TRA 2x15  THERAPEUTIC EXERCISE: To improve strength, endurance, ROM, and flexibility.  Seated KTOS piriformis stretch x 30 B  THERAPEUTIC ACTIVITIES: To improve functional performance.  Standing trunk rotation green TB x 15 B Standing pallof press GTB x 20 in semi  tandem Standing chops GTB x 20 in semi tandem  08/31/2023 THERAPEUTIC EXERCISE: To improve strength and endurance.  Demonstration, verbal and tactile cues throughout for technique.  NuStep - L6 x 8 min  NEUROMUSCULAR RE-EDUCATION: To improve coordination, kinesthesia, posture, and proprioception. Quadruped thread and reach 2 x 10 bil Quadruped hip ABD & ER 2 x 10 bil, yoga block balanced on low back to promote neutral spine Prone from knees over green Pball - cervical retraction, thoracic extension + I's & T's x 10 each, cues for neutral cervical spine alignment avoiding flexion or extension   08/26/23 Standing shoulder ext 25lb 2x15  Standing rows 25lb 2x15  Trunk rotation blue TB 2x15 Reverse chop blue TB x 15 B Supine DKTC orange pball x 10 Supine bridge orange pball 2x10 Supine KTOS and figure 4 stretch x 30 B S/L open book 5x5 B   08/24/2023 THERAPEUTIC EXERCISE: To improve strength and endurance.  Demonstration, verbal and tactile cues throughout for technique.  Mod Thomas quad/hip flexor stretch with strap 2 x 30 Prone quad stretch with strap and towel roll under knee 2 x 30 Seated GTB hamstring curl 2 x 10, cues to slow pace with ~3 pause before starting slow eccentric release Seated GTB knee extension 2 x 10   08/19/23 Nustep level 5 x 6 minutes Rows 25# cues for posture Leg curls 25# 2x10, cues to go slow Leg extension 10# 2x10 cues for TKE and VMO  Feet on ball K2C, rotation, small bridge, isometric abs requires a lot of cues for form and had a lot of questions regarding how to use Passive LE stretches HS and piriformis Looked at 65 and 75cm ball at desk for posture as he had questions  about it and form Red tband ER   08/17/2023  THERAPEUTIC EXERCISE: To improve strength and endurance.  Demonstration, verbal and tactile cues throughout for technique.  NuStep - L5 x 6 min - UE/LE  NEUROMUSCULAR RE-EDUCATION: To improve balance, coordination, kinesthesia,  posture, proprioception, and reduce fall risk. Standing TrA + GTB scap retraction with B shoulder rows 2 x 10 - cues to avoid upward rotation of elbows Standing TrA + GTB scap retraction with B shoulder extension 2 x 10 - cues to avoid shoulder shrug Standing TrA + GTB scap retraction with shoulder horiz ABD 2 x 10 - standing with back along door frame to facilitate improved posture and scapular activation Standing TrA + GTB scap retraction with shoulder ER 2 x 10 - standing with back along door frame to facilitate improved posture and scapular activation Standing TrA + GTB pallof press x 10 bil SLS with TrA isometric + RTB 4-way SLR x 10 bil   08/10/2023 THERAPEUTIC EXERCISE: To improve strength and endurance.  Demonstration, verbal and tactile cues throughout for technique.  NuStep - L4 x 6 min - UE/LE  PHYSICAL PERFORMANCE TEST or MEASUREMENT: 5xSTS = 15.75 sec; >15 sec indicates risk for recurrent falls Berg = 50/56; indicates moderate (>50%) risk for falls FGA = 22/30; indicates medium risk for falls  Berg Balance Test   Sit to Stand Able to stand without using hands and stabilize independently    Standing Unsupported Able to stand safely 2 minutes    Sitting with Back Unsupported but Feet Supported on Floor or Stool Able to sit safely and securely 2 minutes    Stand to Sit Sits safely with minimal use of hands    Transfers Able to transfer safely, minor use of hands    Standing Unsupported with Eyes Closed Able to stand 10 seconds with supervision    Standing Unsupported with Feet Together Able to place feet together independently and stand 1 minute safely    From Standing, Reach Forward with Outstretched Arm Can reach forward >12 cm safely (5)    From Standing Position, Pick up Object from Floor Able to pick up shoe safely and easily    From Standing Position, Turn to Look Behind Over each Shoulder Looks behind from both sides and weight shifts well    Turn 360 Degrees Able to turn  360 degrees safely in 4 seconds or less    Standing Unsupported, Alternately Place Feet on Step/Stool Able to stand independently and safely and complete 8 steps in 20 seconds    Standing Unsupported, One Foot in Front Able to plae foot ahead of the other independently and hold 30 seconds    Standing on One Leg Tries to lift leg/unable to hold 3 seconds but remains standing independently    Total Score 50    Berg comment: 46-51 moderate (>50%)      Functional Gait  Assessment   Gait Level Surface Walks 20 ft in less than 7 sec but greater than 5.5 sec, uses assistive device, slower speed, mild gait deviations, or deviates 6-10 in outside of the 12 in walkway width.    Change in Gait Speed Able to smoothly change walking speed without loss of balance or gait deviation. Deviate no more than 6 in outside of the 12 in walkway width.    Gait with Horizontal Head Turns Performs head turns smoothly with no change in gait. Deviates no more than 6 in outside 12 in walkway width  Gait with Vertical Head Turns Performs head turns with no change in gait. Deviates no more than 6 in outside 12 in walkway width.    Gait and Pivot Turn Pivot turns safely within 3 sec and stops quickly with no loss of balance.    Step Over Obstacle Is able to step over one shoe box (4.5 in total height) without changing gait speed. No evidence of imbalance.    Gait with Narrow Base of Support Ambulates 4-7 steps.    Gait with Eyes Closed Walks 20 ft, slow speed, abnormal gait pattern, evidence for imbalance, deviates 10-15 in outside 12 in walkway width. Requires more than 9 sec to ambulate 20 ft.    Ambulating Backwards Walks 20 ft, slow speed, abnormal gait pattern, evidence for imbalance, deviates 10-15 in outside 12 in walkway width.    Steps Alternating feet, no rail.    Total Score 22    FGA comment: 19-24 = medium risk fall      NEUROMUSCULAR RE-EDUCATION: To improve coordination, kinesthesia, and posture.  Hooklying  TrA + GTB shoulder horiz ABD 10 x 5 Hooklying TrA + alt GTB hip ABD/ER bent knee fallout 10 x 5   08/08/2023  THERAPEUTIC EXERCISE: To improve strength, endurance, ROM, and flexibility.  Demonstration, verbal and tactile cues throughout for technique.  Rec Bike - L2 x 6 min Doorway pec stretch at 90 ABD 2 x 30  Seated scapular retraction +ER 10 x 3-5 Supine HS stretch with strap 3 x 30 bil Supine cross-body ITB stretch with strap 3 x 30 bil Supine gluteus stretch 3 x 30 bil Hooklying KTOS piriformis stretch 3 x 30 bil Supine LTR 5 x 10 Hooklying hip ADD isometric with ball 10 x 5  NEUROMUSCULAR RE-EDUCATION: To improve coordination, kinesthesia, and posture.  Hooklying TrA + GTB shoulder horiz ABD 10 x 5 Hooklying TrA + GTB shoulder ER 10 x 5 Hooklying TrA + alt GTB hip ABD/ER bent knee fallout 10 x 5   08/03/2023  SELF CARE:  Reviewed eval findings and role of PT in addressing identified deficits as well as instruction in initial HEP (see below).    PATIENT EDUCATION:  Education details: HEP progression  Person educated: Patient Education method: Explanation, Demonstration, Verbal cues, Handouts, and MedBridge[employer] app updated Education comprehension: verbalized understanding, returned demonstration, verbal cues required, and needs further education  HOME EXERCISE PROGRAM: *Pt has MedBridgeGO access but also prefers printed handouts.  Access Code: [email or web address] URL: [email or web address] Date: 08/31/2023 Prepared by: Elijah Hidden  Exercises - Supine Hamstring Stretch with Strap  - 1-2 x daily - 7 x weekly - 3 reps - 30 sec hold - Supine Iliotibial Band Stretch with Strap  - 1-2 x daily - 7 x weekly - 3 reps - 30 sec hold - Prone Hip Flexor & Quad Stretch with Strap  - 1-2 x daily - 7 x weekly - 3 reps - 30 sec hold - Hip Flexor Stretch with Strap on Table  - 1-2 x daily - 7 x weekly - 3 reps - 30 sec hold - Supine Gluteus Stretch  - 1-2 x daily - 7 x  weekly - 3 reps - 30 sec hold - Supine Piriformis Stretch with Foot on Ground  - 1-2 x daily - 7 x weekly - 3 reps - 30 sec hold - Supine Lower Trunk Rotation  - 1-2 x daily - 7 x weekly - 2 sets - 5 reps - 10 sec hold - Supine Hip Adduction  Isometric with Ball  - 1-2 x daily - 7 x weekly - 2 sets - 10 reps - 5 sec hold - Doorway Pec Stretch at 90 Degrees Abduction  - 1-2 x daily - 7 x weekly - 3 reps - 30 sec hold - Seated Scapular Retraction with External Rotation  - 1-2 x daily - 7 x weekly - 2 sets - 10 reps - 3-5 sec hold - Hooklying Single Leg Bent Knee Fallouts with Resistance  - 1 x daily - 3 x weekly - 2 sets - 10 reps - 3 sec hold - Supine Shoulder Horizontal Abduction with Resistance  - 1 x daily - 3 x weekly - 2 sets - 10 reps - 3 sec hold - Standing Shoulder Row with Anchored Resistance  - 1 x daily - 3 x weekly - 2 sets - 10 reps - 5 sec hold - Scapular Retraction with Resistance Advanced  - 1 x daily - 3 x weekly - 2 sets - 10 reps - 5 sec hold - Standing Shoulder Horizontal Abduction with Resistance  - 1 x daily - 3 x weekly - 2 sets - 10 reps - 3 sec hold - Shoulder External Rotation and Scapular Retraction with Resistance  - 1 x daily - 3 x weekly - 2 sets - 10 reps - 3-5 sec hold - Standing Anti-Rotation Press with Anchored Resistance  - 1 x daily - 3 x weekly - 2 sets - 10 reps - 3 sec hold - Standing Hip Flexion with Anchored Resistance and Chair Support  - 1 x daily - 3 x weekly - 2 sets - 10 reps - 3 sec hold - Standing Hip Adduction with Resistance  - 1 x daily - 3 x weekly - 2 sets - 10 reps - 3 sec hold - Standing Hip Extension with Resistance  - 1 x daily - 3 x weekly - 2 sets - 10 reps - 3 sec hold - Standing Hip Abduction with Resistance  - 1 x daily - 3 x weekly - 2 sets - 10 reps - 3 sec hold - Seated Hamstring Curl with Anchored Resistance  - 1 x daily - 3 x weekly - 2 sets - 10 reps - 3-5 sec hold - Sitting Knee Extension with Resistance  - 1 x daily - 3 x weekly -  2 sets - 10 reps - 3-5 sec hold - Reverse Chop with Resistance  - 1 x daily - 3 x weekly - 2 sets - 10 reps - Standing Trunk Rotation with Resistance  - 1 x daily - 3 x weekly - 2 sets - 10 reps - Sidelying Thoracic Rotation with Open Book  - 1 x daily - 3 x weekly - 2 sets - 10 reps - Quadruped With Rotation: Thread The Needle  - 1 x daily - 3 x weekly - 2 sets - 10 reps - 3 sec hold - Quadruped Hip Abduction and External Rotation  - 1 x daily - 3 x weekly - 2 sets - 10 reps - 3 sec hold - Prone Shoulder Extension on Swiss Ball  - 1 x daily - 3 x weekly - 2 sets - 10 reps - 3 sec hold - Prone Middle Trapezius Strengthening on Swiss Ball  - 1 x daily - 3 x weekly - 2 sets - 10 reps - 3 sec hold   ASSESSMENT:  CLINICAL IMPRESSION: Pt responded well to treatment. Worked on functional mobility to improve performance for  golf swings and core stability to improve lumbar support per patient request. Pt with some balance challenges with semi tandem stance + arm movement. Notes not much change in L UE radicular sx but he reports it is intermittent. Quick Dash score has improved by 2 points. Janson will benefit from continued skilled PT to address ongoing ROM and strength deficits to improve mobility and activity tolerance with decreased pain interference.    EVAL: Steven Basso is a 68 y.o. male who was referred to physical therapy for evaluation and treatment for R sided LBP 2 lumbar DDD.  Patient also c/o intermittent L UE radiculopathy (vibration sensation).  Patient reports onset of R sided LBP pain beginning ~2 months ago w/o know MOI.  Triggers for LBP and R UE radiculopathy are unpredictable.  Patient has deficits in cervical and lumbar ROM, proximal B LE flexibility, L>R UE/scapular strength, B LE strength, core/postural weakness with abnormal posture, and abnormal muscle tension which are interfering with ADLs and are impacting quality of life.  On Modified Oswestry patient scored 18/50  demonstrating 36% or moderate disability.  On QuickDASH patient scored 29.5/100 demonstrating 29.5% disability.  He also notes impaired balance which will be formally assessed next visit.  Acy will benefit from skilled PT to address above deficits to improve mobility and activity tolerance with decreased pain interference.     OBJECTIVE IMPAIRMENTS: decreased activity tolerance, decreased balance, decreased knowledge of condition, decreased mobility, difficulty walking, decreased ROM, decreased strength, increased fascial restrictions, impaired perceived functional ability, increased muscle spasms, impaired flexibility, impaired sensation, impaired UE functional use, improper body mechanics, postural dysfunction, and pain.   ACTIVITY LIMITATIONS: carrying, lifting, bending, sitting, standing, squatting, sleeping, stairs, transfers, bed mobility, reach over head, locomotion level, and caring for others  PARTICIPATION LIMITATIONS: meal prep, cleaning, laundry, community activity, and yard work  PERSONAL FACTORS: Age, Fitness, Past/current experiences, Time since onset of injury/illness/exacerbation, and 3+ comorbidities: DM-II, obesity, elevated BP, GERD, B patellofemoral pain syndrome, B hip pain, h/o R RTC tendinitis, chronic B LBP w/o sciatica, scoliosis, diabetic peripheral neuropathy are also affecting patient's functional outcome.   REHAB POTENTIAL: Good  CLINICAL DECISION MAKING: Evolving/moderate complexity  EVALUATION COMPLEXITY: Moderate   GOALS: Goals reviewed with patient? Yes  SHORT TERM GOALS: Target date: 09/07/2023  Patient will be independent with initial HEP to improve outcomes and carryover.  Baseline:  08/08/23 - reviewed today Goal status: MET - 08/17/23  2.  Patient will report 25% improvement in low back pain to improve QOL. Baseline: 0/10 on eval, up to at least 7/10  08/17/23 - Twinges remain unpredictable but very short-lived Goal status: MET - 08/31/23 - pt reports  overall reduction in pain frequency and intensity by 50%   3.  Patient will report 25% reduction in frequency and intensity of L UE radiculopathy to improve QOL. Baseline: 3-5/10 on eval, up to 8-9/10 08/17/23 - still come and goes  Goal status: IN PROGRESS - 08/31/23 - buzzing essentially unchanged - still come and goes   LONG TERM GOALS: Target date: 10/12/2023  Patient will be independent with ongoing/advanced HEP for self-management at home.  Baseline:  Goal status: IN PROGRESS - 08/31/23 - met for current HEP, anticipate further updates  2.  Patient will report 50-75% improvement in low back pain to improve QOL.  Baseline: 0/10 on eval, up to at least 7/10 Goal status: IN PROGRESS - 08/31/23 - pt reports overall reduction in pain frequency and intensity by 50%   3.  Patient  will report 50-75% reduction in frequency and intensity of L UE radiculopathy to improve QOL. Baseline: 3-5/10 on eval, up to 8-9/10 Goal status: IN PROGRESS - 08/31/23 - buzzing essentially unchanged - still come and goes   4.  Patient to demonstrate ability to achieve and maintain good spinal alignment/posturing and body mechanics needed for daily activities. Baseline: rounded shoulders, forward head, decreased lumbar lordosis, increased thoracic kyphosis, and flexed trunk posture Goal status: INITIAL  5.  Patient will demonstrate functional pain free cervical and lumbar ROM to perform ADLs.   Baseline: Refer to above cervical and lumbar ROM table Goal status: INITIAL  6.  Patient will demonstrate improved scapular, B shoulder and B LE strength to >/= 4+/5 for improved stability and ease of mobility. Baseline: Refer to above UE & LE MMT tables Goal status: INITIAL  7. Patient will report </= 24% on Modified Oswestry (MCID = 12%) to demonstrate improved functional ability with decreased pain interference. Baseline: 18 / 50 = 36.0 % Goal status: INITIAL  8.  Patient will report </= 16% on QuickDASH to  demonstrate improved functional ability with decreased L UE radiculopathy interference. Baseline: 29.5 / 100 = 29.5 % Goal status: IN PROGRESS- 27.3/100 (09/07/23)  9.  Patient will improve FGA score by at least 4 points or to >/= 28/30 (baseline as of prior D/C from PT) to improve gait stability and reduce risk for falls.  Baseline: 22/30 - 08/10/23 Goal status: IN PROGRESS    PLAN:  PT FREQUENCY: 2x/week  PT DURATION: 8-10 weeks  PLANNED INTERVENTIONS: 97164- PT Re-evaluation, 97750- Physical Performance Testing, 97110-Therapeutic exercises, 97530- Therapeutic activity, 97112- Neuromuscular re-education, 97535- Self Care, [id number]- Manual therapy, [phone]- Gait training, [id number]- Aquatic Therapy, [phone]- Electrical stimulation (unattended), [phone]- Ultrasound, [id number]- Traction (mechanical), [id number]- Ionotophoresis 4mg /ml Dexamethasone , 79439 (1-2 muscles), [id number] (3+ muscles)- Dry Needling, Patient/Family education, Balance training, Stair training, Taping, Joint mobilization, Spinal mobilization, Cryotherapy, and Moist heat  PLAN FOR NEXT SESSION: 10th visit PN; Work on streamlining/condensing HEP while continuing to progress core and postural flexibility and strengthening; MT +/- TPDN to address abnormal muscle tension   Maleeyah Mccaughey L Brooklinn Longbottom, PTA 09/07/2023, 8:55 AM

## 2023-09-09 ENCOUNTER — Encounter: Payer: Self-pay | Admitting: Physical Therapy

## 2023-09-09 ENCOUNTER — Ambulatory Visit: Admitting: Physical Therapy

## 2023-09-09 DIAGNOSIS — M6281 Muscle weakness (generalized): Secondary | ICD-10-CM

## 2023-09-09 DIAGNOSIS — M5459 Other low back pain: Secondary | ICD-10-CM | POA: Diagnosis not present

## 2023-09-09 DIAGNOSIS — R293 Abnormal posture: Secondary | ICD-10-CM

## 2023-09-09 DIAGNOSIS — R2681 Unsteadiness on feet: Secondary | ICD-10-CM

## 2023-09-09 DIAGNOSIS — M5412 Radiculopathy, cervical region: Secondary | ICD-10-CM

## 2023-09-09 NOTE — Therapy (Signed)
 OUTPATIENT PHYSICAL THERAPY TREATMENT  Progress Note  Reporting Period 08/03/2023 to 09/09/2023   See note below for Objective Data and Assessment of Progress/Goals.     Patient Name: Noah Lane MRN: [id number] DOB:1955-10-11, 68 y.o., male Today's Date: 09/09/2023  END OF SESSION:  PT End of Session - 09/09/23 0806     Visit Number 10    Date for PT Re-Evaluation 10/12/23    Authorization Type Medicare & BCBS    Progress Note Due on Visit 20    PT Start Time 0806    PT Stop Time 0851    PT Time Calculation (min) 45 min    Activity Tolerance Patient tolerated treatment well    Behavior During Therapy [facility] for tasks assessed/performed                  Past Medical History:  Diagnosis Date   Broken toe    Diabetes mellitus type 2 in obese    Mixed hyperlipidemia    Past Surgical History:  Procedure Laterality Date   COLON RESECTION N/A 04/27/2022   Procedure: LAPAROSCOPIC HAND ASSISTED RIGHT HEMI COLECTOMY, POSSIBLE OPEN;  Surgeon: Dasie Leonor CROME, MD;  Location: [facility];  Service: General;  Laterality: N/A;   COLONOSCOPY     HERNIA REPAIR  2012   Patient Active Problem List   Diagnosis Date Noted   Elevated blood pressure reading 05/04/2022   Colon adenocarcinoma (HCC) 05/04/2022   Gastroesophageal reflux disease 04/29/2022   Ileus, postoperative (HCC) 04/29/2022   Hypokalemia 04/28/2022   Hyperlipidemia 04/28/2022   Colonic obstruction (HCC) 04/25/2022   Hyponatremia 04/25/2022   OSA on CPAP 12/03/2021   Obesity (BMI 30.0-34.9) 12/03/2021   Mixed hyperlipidemia 10/20/2021   Strain of lumbar region 10/16/2021   Strain of calf muscle 07/22/2021   Patellofemoral pain syndrome of both knees 02/24/2021   Rotator cuff tendinitis, right 12/28/2019   Hip pain, bilateral 05/28/2019   Chronic bilateral low back pain without sciatica 05/28/2019   Type 2 diabetes mellitus with obesity (HCC) 05/28/2019   Erectile dysfunction 05/28/2019   Pain and swelling of  left knee 03/12/2019   Lateral epicondylitis, right elbow 03/15/2017   Diverticulosis of large intestine without hemorrhage 02/25/2014    PCP: Frann Mabel Mt, DO   REFERRING PROVIDER: Arvell Evalene SAUNDERS, DO   REFERRING DIAG: [phone] (ICD-10-CM) - Degeneration of intervertebral disc of lumbar region, unspecified whether pain present   THERAPY DIAG:  Other low back pain  Radiculopathy, cervical region  Muscle weakness (generalized)  Abnormal posture  Unsteadiness on feet  RATIONALE FOR EVALUATION AND TREATMENT: Rehabilitation  ONSET DATE: early April 2025  NEXT MD VISIT: None scheduled with referring provider, 08/19/23 with PCP   SUBJECTIVE:  SUBJECTIVE STATEMENT: Patient denies and pain this morning.  States the nagging L arm buzzing still makes it's appearance when it wants to.  EVAL:  Pt reports he started getting LBP, mostly on R side, ~2 months ago w/o known MOI.  Pain first noted while rolling over in bed.  Pain comes in strikes w/o predictable trigger.  Pain is momentary lasting a few minutes when present.  He also reports recent onset of L UE numbness and tingling that he says Dr. Frann recommended he mention to PT.  UE radiculopathy also unpredictable in trigger and varies in duration.  He notes constant stiffness in neck, but denies pain.  He also reports balance issues, esp initially on standing, which he attributes to a weak core and his neuropathy.  PAIN: Are you having pain? No and Yes: NPRS scale: 0/10   Pain location: R low back  Pain description: exercise soreness  Aggravating factors: unpredictable  Relieving factors: remain still until pain resolves   Are you having pain? Yes: NPRS scale: comes and goes at all types of intensities  Pain location:  L UE down to radial side of hand, occasional in thumb  Pain description: vibration (reminds him of a TENS unit), stiffness in neck  Aggravating factors: unpredictable  Relieving factors: heating pads, stretches   PERTINENT HISTORY:  DM-II, obesity, elevated BP, GERD, B patellofemoral pain syndrome, B hip pain, h/o R RTC tendinitis, chronic B LBP w/o sciatica, scoliosis, diabetic peripheral neuropathy  PRECAUTIONS: None  RED FLAGS: None  WEIGHT BEARING RESTRICTIONS: No  FALLS:  Has patient fallen in last 6 months? 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 I would like the shoulder thing to go away. Better balance.   OBJECTIVE: (objective measures completed at initial evaluation unless otherwise dated)  DIAGNOSTIC FINDINGS:  10/16/21 - DG lumbar spine IMPRESSION: No acute fracture or subluxation of the lumbar spine.  PATIENT SURVEYS:  Modified Oswestry 18 / 50 = 36.0 %, moderate disability   Quick Dash 29.5 / 100 = 29.5 %  COGNITION:  Overall cognitive status: History of cognitive impairments - at baseline (mostly STM memory recall)    SENSATION: WFL B peripheral neuropathy in feet  POSTURE:  rounded shoulders, forward head, decreased lumbar lordosis, increased thoracic kyphosis, and flexed trunk   PALPATION: Increased muscle tension in L>R UT and LS as well as R>L lumbar paraspinals and glutes/piriformis  CERVICAL ROM:   Active ROM Eval 09/09/23  Flexion 49 52   Extension 42 48  Right lateral flexion 24 32  Left lateral flexion 16 24  Right rotation 51 61  Left rotation 52 58   (Blank rows = not tested)  UPPER EXTREMITY MMT:  MMT Right eval Left eval R 09/09/23 L 09/09/23  Shoulder flexion 4+ 4- 5 4+  Shoulder extension 4+ 4+ 5 5  Shoulder abduction 4+ 4 5 5   Shoulder adduction      Shoulder internal rotation 4+ 4+ 5 5  Shoulder external rotation 4  4 4+ 4+  Middle trapezius 4- 4- 4+ 4+  Lower trapezius 3+ 3+ 4 4  Grip strength       (Blank rows = not tested)  LUMBAR ROM:   Active  Eval 09/09/23  Flexion Hands to mid shins Fingertips to toes  Extension 25% limited WFL  Right lateral flexion Hand to femoral condyle - tight Hand to femoral condyle - heavy tightness  Left lateral flexion Hand to lateral knee - twinge in R low back Hand to femoral condyle - heavy tightness  Right rotation [facility] WFL  Left rotation [facility] WFL  (Blank rows = not tested)  MUSCLE LENGTH: Hamstrings: mod/severe tight L>R ITB: mod/severe tight B Piriformis: mild/mod tight R>L Hip flexors: mod tight B Quads: mod tight R>L  LOWER EXTREMITY ROM:    Grossly WFL  LOWER EXTREMITY MMT:    MMT Right eval Left eval R 09/09/23 L 09/09/23  Hip flexion 4 4 4+ 4+  Hip extension 4- 4- 4+ 4+  Hip abduction 4- 4- 4 4+  Hip adduction 4- 3+ 4+ 4  Hip internal rotation 5 4+ 5 (limited ROM) 5 (limited ROM)  Hip external rotation 4- 3+ 4- 4 (limited ROM)  Knee flexion 5 4+ 5 5  Knee extension 5 5 5 5   Ankle dorsiflexion 4+ 4+ 5 5  Ankle plantarflexion      Ankle inversion      Ankle eversion       (Blank rows = not tested)  LUMBAR SPECIAL TESTS:  Straight leg raise test: Negative and Slump test: Negative  FUNCTIONAL TESTS: (08/10/23) 5 times sit to stand: 15.75 sec Functional gait assessment: 22/30; indicated medium risk for falls Berg balance scale: 50/56; indicates moderate (>50%) risk for falls    09/09/23: FGA = 24/30, indicative of medium fall  risk   Scores as of D/C from PT in July 2024: Berg: 54/56 FGA: 28/30   TODAY'S TREATMENT:   09/09/23 THERAPEUTIC EXERCISE: To improve strength and endurance.  Demonstration, verbal and tactile cues throughout for technique.  NuStep - L6 x 8 min  PHYSICAL PERFORMANCE TEST or MEASUREMENT: Modified Oswestry: 20 / 50 = 40.0 % Functional Gait  Assessment  Gait Level Surface Walks 20 ft in less than 5.5 sec, no assistive devices, good speed, no evidence for imbalance, normal gait pattern, deviates no more than 6 in outside of the 12 in walkway width.   Change in Gait Speed Able to smoothly change walking speed without loss of balance or gait deviation. Deviate no more than 6 in outside of the 12 in walkway width.   Gait with Horizontal Head Turns Performs head turns smoothly with no change in gait. Deviates no more than 6 in outside 12 in walkway width   Gait with Vertical Head Turns Performs head turns with no change in gait. Deviates no more than 6 in outside 12 in walkway width.   Gait and Pivot Turn Pivot turns safely within 3 sec and stops quickly with no loss of balance.   Step Over Obstacle Is able to step over one shoe box (4.5 in total height) without changing gait speed. No evidence of imbalance.   Gait with Narrow Base of Support Ambulates 4-7 steps.   Gait with Eyes Closed Walks 20 ft, uses assistive device, slower speed, mild gait deviations, deviates 6-10 in outside 12 in walkway width. Ambulates 20 ft in less than 9 sec but greater than 7 sec.   Ambulating Backwards Walks 20 ft, uses  assistive device, slower speed, mild gait deviations, deviates 6-10 in outside 12 in walkway width.   Steps Alternating feet, must use rail.   Total Score 24   FGA comment: 19-24 = medium risk fall      THERAPEUTIC ACTIVITIES: To improve functional performance.  Demonstration, verbal and tactile cues throughout for technique. Cervical & lumbar ROM assessment UE/LE MMT Goal assessment  SELF  CARE:  Review of progress with PT, status of goals, and plans for ongoing PT POC requesting patient input and feedback to ensure PT is addressing all of his concerns.   09/07/23 NEUROMUSCULAR RE-EDUCATION: To improve coordination, kinesthesia and proprioception.  Quadruped cat/cow x 10 Quadruped fire hydrants x 10 B- using hand grips Bird dog x 10 B Ab sets orange pball 10x5 LE pedals + TRA 2x15  THERAPEUTIC EXERCISE: To improve strength, endurance, ROM, and flexibility.  Seated KTOS piriformis stretch x 30 B  THERAPEUTIC ACTIVITIES: To improve functional performance.  Standing trunk rotation green TB x 15 B Standing pallof press GTB x 20 in semi tandem Standing chops GTB x 20 in semi tandem   08/31/2023 THERAPEUTIC EXERCISE: To improve strength and endurance.  Demonstration, verbal and tactile cues throughout for technique.  NuStep - L6 x 8 min  NEUROMUSCULAR RE-EDUCATION: To improve coordination, kinesthesia, posture, and proprioception. Quadruped thread and reach 2 x 10 bil Quadruped hip ABD & ER 2 x 10 bil, yoga block balanced on low back to promote neutral spine Prone from knees over green Pball - cervical retraction, thoracic extension + I's & T's x 10 each, cues for neutral cervical spine alignment avoiding flexion or extension   PATIENT EDUCATION:  Education details: progress with PT and ongoing PT POC  Person educated: Patient Education method: Explanation Education comprehension: verbalized understanding  HOME EXERCISE PROGRAM: *Pt has MedBridgeGO access but also prefers printed handouts.  Access Code: [email or web address] URL: [email or web address] Date: 08/31/2023 Prepared by: Elijah Hidden  Exercises - Supine Hamstring Stretch with Strap  - 1-2 x daily - 7 x weekly - 3 reps - 30 sec hold - Supine Iliotibial Band Stretch with Strap  - 1-2 x daily - 7 x weekly - 3 reps - 30 sec hold - Prone Hip Flexor & Quad Stretch with Strap  - 1-2 x daily - 7 x weekly - 3  reps - 30 sec hold - Hip Flexor Stretch with Strap on Table  - 1-2 x daily - 7 x weekly - 3 reps - 30 sec hold - Supine Gluteus Stretch  - 1-2 x daily - 7 x weekly - 3 reps - 30 sec hold - Supine Piriformis Stretch with Foot on Ground  - 1-2 x daily - 7 x weekly - 3 reps - 30 sec hold - Supine Lower Trunk Rotation  - 1-2 x daily - 7 x weekly - 2 sets - 5 reps - 10 sec hold - Supine Hip Adduction Isometric with Ball  - 1-2 x daily - 7 x weekly - 2 sets - 10 reps - 5 sec hold - Doorway Pec Stretch at 90 Degrees Abduction  - 1-2 x daily - 7 x weekly - 3 reps - 30 sec hold - Seated Scapular Retraction with External Rotation  - 1-2 x daily - 7 x weekly - 2 sets - 10 reps - 3-5 sec hold - Hooklying Single Leg Bent Knee Fallouts with Resistance  - 1 x daily - 3 x weekly - 2 sets - 10 reps - 3 sec hold -  Supine Shoulder Horizontal Abduction with Resistance  - 1 x daily - 3 x weekly - 2 sets - 10 reps - 3 sec hold - Standing Shoulder Row with Anchored Resistance  - 1 x daily - 3 x weekly - 2 sets - 10 reps - 5 sec hold - Scapular Retraction with Resistance Advanced  - 1 x daily - 3 x weekly - 2 sets - 10 reps - 5 sec hold - Standing Shoulder Horizontal Abduction with Resistance  - 1 x daily - 3 x weekly - 2 sets - 10 reps - 3 sec hold - Shoulder External Rotation and Scapular Retraction with Resistance  - 1 x daily - 3 x weekly - 2 sets - 10 reps - 3-5 sec hold - Standing Anti-Rotation Press with Anchored Resistance  - 1 x daily - 3 x weekly - 2 sets - 10 reps - 3 sec hold - Standing Hip Flexion with Anchored Resistance and Chair Support  - 1 x daily - 3 x weekly - 2 sets - 10 reps - 3 sec hold - Standing Hip Adduction with Resistance  - 1 x daily - 3 x weekly - 2 sets - 10 reps - 3 sec hold - Standing Hip Extension with Resistance  - 1 x daily - 3 x weekly - 2 sets - 10 reps - 3 sec hold - Standing Hip Abduction with Resistance  - 1 x daily - 3 x weekly - 2 sets - 10 reps - 3 sec hold - Seated Hamstring  Curl with Anchored Resistance  - 1 x daily - 3 x weekly - 2 sets - 10 reps - 3-5 sec hold - Sitting Knee Extension with Resistance  - 1 x daily - 3 x weekly - 2 sets - 10 reps - 3-5 sec hold - Reverse Chop with Resistance  - 1 x daily - 3 x weekly - 2 sets - 10 reps - Standing Trunk Rotation with Resistance  - 1 x daily - 3 x weekly - 2 sets - 10 reps - Sidelying Thoracic Rotation with Open Book  - 1 x daily - 3 x weekly - 2 sets - 10 reps - Quadruped With Rotation: Thread The Needle  - 1 x daily - 3 x weekly - 2 sets - 10 reps - 3 sec hold - Quadruped Hip Abduction and External Rotation  - 1 x daily - 3 x weekly - 2 sets - 10 reps - 3 sec hold - Prone Shoulder Extension on Swiss Ball  - 1 x daily - 3 x weekly - 2 sets - 10 reps - 3 sec hold - Prone Middle Trapezius Strengthening on Swiss Ball  - 1 x daily - 3 x weekly - 2 sets - 10 reps - 3 sec hold   ASSESSMENT:  CLINICAL IMPRESSION: Noah Lane reports improvement in his low back pain, with no pain reported on recent visits.  Less of a change noted with L UE radicular symptoms with presentation remaining inconsistent and unpredictable.  Cervical and lumbar ROM improving with remaining restrictions more due to tightness than pain.  UE and LE strength also improving with remaining UE weakness mostly postural at scapular level, and remaining LE weakness predominantly in lateral hip musculature.  Gains noted on QuickDASH when completed last visit however modified Oswestry indicating slight decline in perceived function today despite above gains.  FGA improved to 24/30 however still indicating medium fall risk.  Domique has met STG's #1 and 2, and  is demonstrating progress towards the majority of his remaining goals.  He will benefit from continued skilled PT to address ongoing ROM and strength deficits to improve mobility and activity tolerance with decreased pain interference.    EVAL: Noah Lane is a 68 y.o. male who was referred to physical therapy  for evaluation and treatment for R sided LBP 2 lumbar DDD.  Patient also c/o intermittent L UE radiculopathy (vibration sensation).  Patient reports onset of R sided LBP pain beginning ~2 months ago w/o know MOI.  Triggers for LBP and R UE radiculopathy are unpredictable.  Patient has deficits in cervical and lumbar ROM, proximal B LE flexibility, L>R UE/scapular strength, B LE strength, core/postural weakness with abnormal posture, and abnormal muscle tension which are interfering with ADLs and are impacting quality of life.  On Modified Oswestry patient scored 18/50 demonstrating 36% or moderate disability.  On QuickDASH patient scored 29.5/100 demonstrating 29.5% disability.  He also notes impaired balance which will be formally assessed next visit.  Faraaz will benefit from skilled PT to address above deficits to improve mobility and activity tolerance with decreased pain interference.     OBJECTIVE IMPAIRMENTS: decreased activity tolerance, decreased balance, decreased knowledge of condition, decreased mobility, difficulty walking, decreased ROM, decreased strength, increased fascial restrictions, impaired perceived functional ability, increased muscle spasms, impaired flexibility, impaired sensation, impaired UE functional use, improper body mechanics, postural dysfunction, and pain.   ACTIVITY LIMITATIONS: carrying, lifting, bending, sitting, standing, squatting, sleeping, stairs, transfers, bed mobility, reach over head, locomotion level, and caring for others  PARTICIPATION LIMITATIONS: meal prep, cleaning, laundry, community activity, and yard work  PERSONAL FACTORS: Age, Fitness, Past/current experiences, Time since onset of injury/illness/exacerbation, and 3+ comorbidities: DM-II, obesity, elevated BP, GERD, B patellofemoral pain syndrome, B hip pain, h/o R RTC tendinitis, chronic B LBP w/o sciatica, scoliosis, diabetic peripheral neuropathy are also affecting patient's functional outcome.    REHAB POTENTIAL: Good  CLINICAL DECISION MAKING: Evolving/moderate complexity  EVALUATION COMPLEXITY: Moderate   GOALS: Goals reviewed with patient? Yes  SHORT TERM GOALS: Target date: 09/07/2023  Patient will be independent with initial HEP to improve outcomes and carryover.  Baseline:  08/08/23 - reviewed today Goal status: MET - 08/17/23  2.  Patient will report 25% improvement in low back pain to improve QOL. Baseline: 0/10 on eval, up to at least 7/10  08/17/23 - Twinges remain unpredictable but very short-lived Goal status: MET - 08/31/23 - pt reports overall reduction in pain frequency and intensity by 50%   3.  Patient will report 25% reduction in frequency and intensity of L UE radiculopathy to improve QOL. Baseline: 3-5/10 on eval, up to 8-9/10 08/17/23 - still come and goes  Goal status: IN PROGRESS - 09/09/23 - buzzing essentially unchanged - still come and goes   LONG TERM GOALS: Target date: 10/12/2023  Patient will be independent with ongoing/advanced HEP for self-management at home.  Baseline:  Goal status: IN PROGRESS - 08/31/23 - met for current HEP, anticipate further updates  2.  Patient will report 50-75% improvement in low back pain to improve QOL.  Baseline: 0/10 on eval, up to at least 7/10 Goal status: IN PROGRESS - 08/31/23 - pt reports overall reduction in pain frequency and intensity by 50%   3.  Patient will report 50-75% reduction in frequency and intensity of L UE radiculopathy to improve QOL. Baseline: 3-5/10 on eval, up to 8-9/10 Goal status: IN PROGRESS - 08/31/23 - buzzing essentially unchanged - still come and  goes   4.  Patient to demonstrate ability to achieve and maintain good spinal alignment/posturing and body mechanics needed for daily activities. Baseline: rounded shoulders, forward head, decreased lumbar lordosis, increased thoracic kyphosis, and flexed trunk posture Goal status: IN PROGRESS - 09/09/23 - improving postural awareness but  continued intermittent cues necessary  5.  Patient will demonstrate functional pain free cervical and lumbar ROM to perform ADLs.   Baseline: Refer to above cervical and lumbar ROM table Goal status: IN PROGRESS - 09/09/23 - improving cervical and lumbar ROM with remaining restriction more due to tightness than pain  6.  Patient will demonstrate improved scapular, B shoulder and B LE strength to >/= 4+/5 for improved stability and ease of mobility. Baseline: Refer to above UE & LE MMT tables Goal status: IN PROGRESS - 09/09/23 - met except B lower trap for UE and Hip ABD/ADD and ER for LE  7. Patient will report </= 24% on Modified Oswestry (MCID = 12%) to demonstrate improved functional ability with decreased pain interference. Baseline: 18 / 50 = 36.0 % Goal status: IN PROGRESS - 09/09/23 - 20 / 50 = 40.0 %  8.  Patient will report </= 16% on QuickDASH to demonstrate improved functional ability with decreased L UE radiculopathy interference. Baseline: 29.5 / 100 = 29.5 % Goal status: IN PROGRESS - 09/07/23 - 27.3/100   9.  Patient will improve FGA score by at least 4 points or to >/= 28/30 (baseline as of prior D/C from PT) to improve gait stability and reduce risk for falls.  Baseline: 22/30 - 08/10/23 Goal status: IN PROGRESS - 09/09/23 - 24/30   PLAN:  PT FREQUENCY: 2x/week  PT DURATION: 8-10 weeks  PLANNED INTERVENTIONS: 02835- PT Re-evaluation, 97750- Physical Performance Testing, 97110-Therapeutic exercises, 97530- Therapeutic activity, 97112- Neuromuscular re-education, 97535- Self Care, 02859- Manual therapy, [phone]- Gait training, [phone]- Aquatic Therapy, [phone]- Electrical stimulation (unattended), [phone]- Ultrasound, [id number]- Traction (mechanical), [id number]- Ionotophoresis 4mg /ml Dexamethasone , 79439 (1-2 muscles), 20561 (3+ muscles)- Dry Needling, Patient/Family education, Balance training, Stair training, Taping, Joint mobilization, Spinal mobilization, Cryotherapy, and Moist heat  PLAN  FOR NEXT SESSION: Work on streamlining/condensing HEP while continuing to progress core and postural flexibility and strengthening; MT +/- TPDN to address abnormal muscle tension; maybe schedule with Garnette if interested in more golf specific mobility/exercises?   Elijah CHRISTELLA Hidden, PT 09/09/2023, 1:16 PM

## 2023-09-12 ENCOUNTER — Encounter: Payer: Self-pay | Admitting: Physical Therapy

## 2023-09-12 ENCOUNTER — Encounter: Payer: Self-pay | Admitting: Family Medicine

## 2023-09-12 ENCOUNTER — Ambulatory Visit (INDEPENDENT_AMBULATORY_CARE_PROVIDER_SITE_OTHER): Admitting: Family Medicine

## 2023-09-12 ENCOUNTER — Ambulatory Visit: Admitting: Physical Therapy

## 2023-09-12 VITALS — BP 130/80 | HR 78 | Temp 98.0°F | Resp 16 | Ht 73.0 in | Wt 251.0 lb

## 2023-09-12 DIAGNOSIS — M5459 Other low back pain: Secondary | ICD-10-CM

## 2023-09-12 DIAGNOSIS — L918 Other hypertrophic disorders of the skin: Secondary | ICD-10-CM

## 2023-09-12 DIAGNOSIS — M6281 Muscle weakness (generalized): Secondary | ICD-10-CM

## 2023-09-12 DIAGNOSIS — M5412 Radiculopathy, cervical region: Secondary | ICD-10-CM

## 2023-09-12 DIAGNOSIS — R2681 Unsteadiness on feet: Secondary | ICD-10-CM

## 2023-09-12 DIAGNOSIS — R293 Abnormal posture: Secondary | ICD-10-CM

## 2023-09-12 NOTE — Therapy (Signed)
 OUTPATIENT PHYSICAL THERAPY TREATMENT     Patient Name: Noah Lane MRN: [id number] DOB:09/13/1955, 68 y.o., male Today's Date: 09/12/2023  END OF SESSION:  PT End of Session - 09/12/23 0808     Visit Number 11    Date for PT Re-Evaluation 10/12/23    Authorization Type Medicare & BCBS    Progress Note Due on Visit 20    PT Start Time 0808   Pt arrived late   PT Stop Time 0849    PT Time Calculation (min) 41 min    Activity Tolerance Patient tolerated treatment well    Behavior During Therapy WFL for tasks assessed/performed              Past Medical History:  Diagnosis Date   Broken toe    Diabetes mellitus type 2 in obese    Mixed hyperlipidemia    Past Surgical History:  Procedure Laterality Date   COLON RESECTION N/A 04/27/2022   Procedure: LAPAROSCOPIC HAND ASSISTED RIGHT HEMI COLECTOMY, POSSIBLE OPEN;  Surgeon: Dasie Leonor CROME, MD;  Location: [facility];  Service: General;  Laterality: N/A;   COLONOSCOPY     HERNIA REPAIR  2012   Patient Active Problem List   Diagnosis Date Noted   Elevated blood pressure reading 05/04/2022   Colon adenocarcinoma (HCC) 05/04/2022   Gastroesophageal reflux disease 04/29/2022   Ileus, postoperative (HCC) 04/29/2022   Hypokalemia 04/28/2022   Hyperlipidemia 04/28/2022   Colonic obstruction (HCC) 04/25/2022   Hyponatremia 04/25/2022   OSA on CPAP 12/03/2021   Obesity (BMI 30.0-34.9) 12/03/2021   Mixed hyperlipidemia 10/20/2021   Strain of lumbar region 10/16/2021   Strain of calf muscle 07/22/2021   Patellofemoral pain syndrome of both knees 02/24/2021   Rotator cuff tendinitis, right 12/28/2019   Hip pain, bilateral 05/28/2019   Chronic bilateral low back pain without sciatica 05/28/2019   Type 2 diabetes mellitus with obesity (HCC) 05/28/2019   Erectile dysfunction 05/28/2019   Pain and swelling of left knee 03/12/2019   Lateral epicondylitis, right elbow 03/15/2017   Diverticulosis of large intestine without  hemorrhage 02/25/2014    PCP: Frann Mabel Mt, DO   REFERRING PROVIDER: Arvell Evalene SAUNDERS, DO   REFERRING DIAG: [phone] (ICD-10-CM) - Degeneration of intervertebral disc of lumbar region, unspecified whether pain present   THERAPY DIAG:  Other low back pain  Radiculopathy, cervical region  Muscle weakness (generalized)  Abnormal posture  Unsteadiness on feet  RATIONALE FOR EVALUATION AND TREATMENT: Rehabilitation  ONSET DATE: early April 2025  NEXT MD VISIT: None scheduled with referring provider, 08/19/23 with PCP   SUBJECTIVE:  SUBJECTIVE STATEMENT: Patient reports he did some gardening on his knees this weekend - woke up sore this morning but denies pain.  EVAL:  Pt reports he started getting LBP, mostly on R side, ~2 months ago w/o known MOI.  Pain first noted while rolling over in bed.  Pain comes in strikes w/o predictable trigger.  Pain is momentary lasting a few minutes when present.  He also reports recent onset of L UE numbness and tingling that he says Dr. Frann recommended he mention to PT.  UE radiculopathy also unpredictable in trigger and varies in duration.  He notes constant stiffness in neck, but denies pain.  He also reports balance issues, esp initially on standing, which he attributes to a weak core and his neuropathy.  PAIN: Are you having pain? No and Yes: NPRS scale: 0/10   Pain location: R low back  Pain description: exercise soreness  Aggravating factors: unpredictable  Relieving factors: remain still until pain resolves   Are you having pain? Yes: NPRS scale: comes and goes at all types of intensities  Pain location: L UE down to radial side of hand, occasional in thumb  Pain description: vibration (reminds him of a TENS unit), stiffness  in neck  Aggravating factors: unpredictable  Relieving factors: heating pads, stretches   PERTINENT HISTORY:  DM-II, obesity, elevated BP, GERD, B patellofemoral pain syndrome, B hip pain, h/o R RTC tendinitis, chronic B LBP w/o sciatica, scoliosis, diabetic peripheral neuropathy  PRECAUTIONS: None  RED FLAGS: None  WEIGHT BEARING RESTRICTIONS: No  FALLS:  Has patient fallen in last 6 months? 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 I would like the shoulder thing to go away. Better balance.   OBJECTIVE: (objective measures completed at initial evaluation unless otherwise dated)  DIAGNOSTIC FINDINGS:  10/16/21 - DG lumbar spine IMPRESSION: No acute fracture or subluxation of the lumbar spine.  PATIENT SURVEYS:  Modified Oswestry 18 / 50 = 36.0 %, moderate disability   Quick Dash 29.5 / 100 = 29.5 %  COGNITION:  Overall cognitive status: History of cognitive impairments - at baseline (mostly STM memory recall)    SENSATION: WFL B peripheral neuropathy in feet  POSTURE:  rounded shoulders, forward head, decreased lumbar lordosis, increased thoracic kyphosis, and flexed trunk   PALPATION: Increased muscle tension in L>R UT and LS as well as R>L lumbar paraspinals and glutes/piriformis  CERVICAL ROM:   Active ROM Eval 09/09/23  Flexion 49 52  Extension 42 48  Right lateral flexion 24 32  Left lateral flexion 16 24  Right rotation 51 61  Left rotation 52 58    (Blank rows = not tested)  UPPER EXTREMITY MMT:  MMT Right eval Left eval R 09/09/23 L 09/09/23  Shoulder flexion 4+ 4- 5 4+  Shoulder extension 4+ 4+ 5 5  Shoulder abduction 4+ 4 5 5   Shoulder adduction      Shoulder internal rotation 4+ 4+ 5 5  Shoulder external rotation 4 4  4+ 4+  Middle trapezius 4- 4- 4+ 4+  Lower trapezius 3+ 3+ 4 4  Grip strength       (Blank rows = not tested)  LUMBAR ROM:   Active  Eval 09/09/23  Flexion Hands to mid shins Fingertips to toes  Extension 25% limited WFL  Right lateral flexion Hand to femoral condyle - tight Hand to femoral condyle - heavy tightness  Left lateral flexion Hand to lateral knee - twinge in R low back Hand to femoral condyle - heavy tightness  Right rotation [facility] WFL  Left rotation [facility] WFL  (Blank rows = not tested)  MUSCLE LENGTH: Hamstrings: mod/severe tight L>R ITB: mod/severe tight B Piriformis: mild/mod tight R>L Hip flexors: mod tight B Quads: mod tight R>L  LOWER EXTREMITY ROM:    Grossly WFL  LOWER EXTREMITY MMT:    MMT Right eval Left eval R 09/09/23 L 09/09/23  Hip flexion 4 4 4+ 4+  Hip extension 4- 4- 4+ 4+  Hip abduction 4- 4- 4 4+  Hip adduction 4- 3+ 4+ 4  Hip internal rotation 5 4+ 5 (limited ROM) 5 (limited ROM)  Hip external rotation 4- 3+ 4- 4 (limited ROM)  Knee flexion 5 4+ 5 5  Knee extension 5 5 5 5   Ankle dorsiflexion 4+ 4+ 5 5  Ankle plantarflexion      Ankle inversion      Ankle eversion       (Blank rows = not tested)  LUMBAR SPECIAL TESTS:  Straight leg raise test: Negative and Slump test: Negative  FUNCTIONAL TESTS: (08/10/23) 5 times sit to stand: 15.75 sec Functional gait assessment: 22/30; indicated medium risk for falls Berg balance scale: 50/56; indicates moderate (>50%) risk for falls    09/09/23: FGA = 24/30, indicative of medium fall risk   Scores as of D/C from PT in July 2024: Berg: 54/56 FGA: 28/30   TODAY'S TREATMENT:   09/12/23 THERAPEUTIC EXERCISE: To  improve strength and endurance.   Elliptical - L1.0 x 5 min  NEUROMUSCULAR RE-EDUCATION: To improve balance, coordination, kinesthesia, posture, proprioception, and reduce fall risk.  Tandem stance 2 x 30 with each foot forward - intermittent UE support on counter  Tandem gait: fwd 4 x 10 ft, backward 2 x 10 ft - intermittent UE support on counter  Staggered stance progressing to tandem stance + GTB pallof press x 10 bil with each foot fwd Corner balance progression with tandem stance on firm surface with arms at sides            Eyes open: static stance x 30 sec horiz head turns x 5 vertical head nods x 5 trunk rotation x 5 Eyes closed: static stance x 15-20 sec   09/09/23 THERAPEUTIC EXERCISE: To improve strength and endurance.  Demonstration, verbal and tactile cues throughout for technique.  NuStep - L6 x 8 min  PHYSICAL PERFORMANCE TEST or MEASUREMENT: Modified Oswestry: 20 / 50 = 40.0 % Functional Gait  Assessment  Gait Level Surface Walks 20 ft in less than 5.5 sec, no assistive devices, good speed, no evidence for imbalance, normal gait pattern, deviates no more than 6 in outside of the 12 in walkway width.   Change in Gait Speed Able to smoothly change walking speed without loss of balance or gait deviation. Deviate no more than 6 in outside of the 12 in walkway width.   Gait with Horizontal Head Turns Performs head turns smoothly with no change in gait. Deviates no more  than 6 in outside 12 in walkway width   Gait with Vertical Head Turns Performs head turns with no change in gait. Deviates no more than 6 in outside 12 in walkway width.   Gait and Pivot Turn Pivot turns safely within 3 sec and stops quickly with no loss of balance.   Step Over Obstacle Is able to step over one shoe box (4.5 in total height) without changing gait speed. No evidence of imbalance.   Gait with Narrow Base of Support Ambulates 4-7 steps.   Gait with Eyes Closed Walks 20 ft, uses assistive device,  slower speed, mild gait deviations, deviates 6-10 in outside 12 in walkway width. Ambulates 20 ft in less than 9 sec but greater than 7 sec.   Ambulating Backwards Walks 20 ft, uses assistive device, slower speed, mild gait deviations, deviates 6-10 in outside 12 in walkway width.   Steps Alternating feet, must use rail.   Total Score 24   FGA comment: 19-24 = medium risk fall      THERAPEUTIC ACTIVITIES: To improve functional performance.  Demonstration, verbal and tactile cues throughout for technique. Cervical & lumbar ROM assessment UE/LE MMT Goal assessment  SELF CARE:  Review of progress with PT, status of goals, and plans for ongoing PT POC requesting patient input and feedback to ensure PT is addressing all of his concerns.   09/07/23 NEUROMUSCULAR RE-EDUCATION: To improve coordination, kinesthesia and proprioception.  Quadruped cat/cow x 10 Quadruped fire hydrants x 10 B- using hand grips Bird dog x 10 B Ab sets orange pball 10x5 LE pedals + TRA 2x15  THERAPEUTIC EXERCISE: To improve strength, endurance, ROM, and flexibility.  Seated KTOS piriformis stretch x 30 B  THERAPEUTIC ACTIVITIES: To improve functional performance.  Standing trunk rotation green TB x 15 B Standing pallof press GTB x 20 in semi tandem Standing chops GTB x 20 in semi tandem   08/31/2023 THERAPEUTIC EXERCISE: To improve strength and endurance.  Demonstration, verbal and tactile cues throughout for technique.  NuStep - L6 x 8 min  NEUROMUSCULAR RE-EDUCATION: To improve coordination, kinesthesia, posture, and proprioception. Quadruped thread and reach 2 x 10 bil Quadruped hip ABD & ER 2 x 10 bil, yoga block balanced on low back to promote neutral spine Prone from knees over green Pball - cervical retraction, thoracic extension + I's & T's x 10 each, cues for neutral cervical spine alignment avoiding flexion or extension   PATIENT EDUCATION:  Education details: progress with PT and ongoing PT POC   Person educated: Patient Education method: Explanation Education comprehension: verbalized understanding  HOME EXERCISE PROGRAM: *Pt has MedBridgeGO access but also prefers printed handouts.  Access Code: [email or web address] URL: [email or web address] Date: 08/31/2023 Prepared by: Elijah Hidden  Exercises - Supine Hamstring Stretch with Strap  - 1-2 x daily - 7 x weekly - 3 reps - 30 sec hold - Supine Iliotibial Band Stretch with Strap  - 1-2 x daily - 7 x weekly - 3 reps - 30 sec hold - Prone Hip Flexor & Quad Stretch with Strap  - 1-2 x daily - 7 x weekly - 3 reps - 30 sec hold - Hip Flexor Stretch with Strap on Table  - 1-2 x daily - 7 x weekly - 3 reps - 30 sec hold - Supine Gluteus Stretch  - 1-2 x daily - 7 x weekly - 3 reps - 30 sec hold - Supine Piriformis Stretch with Foot on Ground  - 1-2 x daily - 7 x  weekly - 3 reps - 30 sec hold - Supine Lower Trunk Rotation  - 1-2 x daily - 7 x weekly - 2 sets - 5 reps - 10 sec hold - Supine Hip Adduction Isometric with Ball  - 1-2 x daily - 7 x weekly - 2 sets - 10 reps - 5 sec hold - Doorway Pec Stretch at 90 Degrees Abduction  - 1-2 x daily - 7 x weekly - 3 reps - 30 sec hold - Seated Scapular Retraction with External Rotation  - 1-2 x daily - 7 x weekly - 2 sets - 10 reps - 3-5 sec hold - Hooklying Single Leg Bent Knee Fallouts with Resistance  - 1 x daily - 3 x weekly - 2 sets - 10 reps - 3 sec hold - Supine Shoulder Horizontal Abduction with Resistance  - 1 x daily - 3 x weekly - 2 sets - 10 reps - 3 sec hold - Standing Shoulder Row with Anchored Resistance  - 1 x daily - 3 x weekly - 2 sets - 10 reps - 5 sec hold - Scapular Retraction with Resistance Advanced  - 1 x daily - 3 x weekly - 2 sets - 10 reps - 5 sec hold - Standing Shoulder Horizontal Abduction with Resistance  - 1 x daily - 3 x weekly - 2 sets - 10 reps - 3 sec hold - Shoulder External Rotation and Scapular Retraction with Resistance  - 1 x daily - 3 x weekly - 2 sets  - 10 reps - 3-5 sec hold - Standing Anti-Rotation Press with Anchored Resistance  - 1 x daily - 3 x weekly - 2 sets - 10 reps - 3 sec hold - Standing Hip Flexion with Anchored Resistance and Chair Support  - 1 x daily - 3 x weekly - 2 sets - 10 reps - 3 sec hold - Standing Hip Adduction with Resistance  - 1 x daily - 3 x weekly - 2 sets - 10 reps - 3 sec hold - Standing Hip Extension with Resistance  - 1 x daily - 3 x weekly - 2 sets - 10 reps - 3 sec hold - Standing Hip Abduction with Resistance  - 1 x daily - 3 x weekly - 2 sets - 10 reps - 3 sec hold - Seated Hamstring Curl with Anchored Resistance  - 1 x daily - 3 x weekly - 2 sets - 10 reps - 3-5 sec hold - Sitting Knee Extension with Resistance  - 1 x daily - 3 x weekly - 2 sets - 10 reps - 3-5 sec hold - Reverse Chop with Resistance  - 1 x daily - 3 x weekly - 2 sets - 10 reps - Standing Trunk Rotation with Resistance  - 1 x daily - 3 x weekly - 2 sets - 10 reps - Sidelying Thoracic Rotation with Open Book  - 1 x daily - 3 x weekly - 2 sets - 10 reps - Quadruped With Rotation: Thread The Needle  - 1 x daily - 3 x weekly - 2 sets - 10 reps - 3 sec hold - Quadruped Hip Abduction and External Rotation  - 1 x daily - 3 x weekly - 2 sets - 10 reps - 3 sec hold - Prone Shoulder Extension on Swiss Ball  - 1 x daily - 3 x weekly - 2 sets - 10 reps - 3 sec hold - Prone Middle Trapezius Strengthening on [employer]  -  1 x daily - 3 x weekly - 2 sets - 10 reps - 3 sec hold   ASSESSMENT:  CLINICAL IMPRESSION: Noah Lane reports good awareness of proper posture and body mechanics while working in his garden over the weekend, working from his knees rather than bending over, but still noting some muscle soreness this morning, although denies pain.  Therapeutic interventions focusing on balance in tandem stance and gait as this was most challenging for him during recent testing.  Noah Lane will benefit from continued skilled PT to address ongoing ROM and strength  deficits to improve mobility and activity tolerance with decreased pain interference and decreased risk for falls.   Noah Lane reports improvement in his low back pain, with no pain reported on recent visits.  Less of a change noted with L UE radicular symptoms with presentation remaining inconsistent and unpredictable.  Cervical and lumbar ROM improving with remaining restrictions more due to tightness than pain.  UE and LE strength also improving with remaining UE weakness mostly postural at scapular level, and remaining LE weakness predominantly in lateral hip musculature.  Gains noted on QuickDASH when completed last visit however modified Oswestry indicating slight decline in perceived function today despite above gains.  FGA improved to 24/30 however still indicating medium fall risk.  Noah Lane has met STG's #1 and 2, and is demonstrating progress towards the majority of his remaining goals.  He will benefit from continued skilled PT to address ongoing ROM and strength deficits to improve mobility and activity tolerance with decreased pain interference.    EVAL: Noah Lane is a 68 y.o. male who was referred to physical therapy for evaluation and treatment for R sided LBP 2 lumbar DDD.  Patient also c/o intermittent L UE radiculopathy (vibration sensation).  Patient reports onset of R sided LBP pain beginning ~2 months ago w/o know MOI.  Triggers for LBP and R UE radiculopathy are unpredictable.  Patient has deficits in cervical and lumbar ROM, proximal B LE flexibility, L>R UE/scapular strength, B LE strength, core/postural weakness with abnormal posture, and abnormal muscle tension which are interfering with ADLs and are impacting quality of life.  On Modified Oswestry patient scored 18/50 demonstrating 36% or moderate disability.  On QuickDASH patient scored 29.5/100 demonstrating 29.5% disability.  He also notes impaired balance which will be formally assessed next visit.  Noah Lane will benefit from  skilled PT to address above deficits to improve mobility and activity tolerance with decreased pain interference.     OBJECTIVE IMPAIRMENTS: decreased activity tolerance, decreased balance, decreased knowledge of condition, decreased mobility, difficulty walking, decreased ROM, decreased strength, increased fascial restrictions, impaired perceived functional ability, increased muscle spasms, impaired flexibility, impaired sensation, impaired UE functional use, improper body mechanics, postural dysfunction, and pain.   ACTIVITY LIMITATIONS: carrying, lifting, bending, sitting, standing, squatting, sleeping, stairs, transfers, bed mobility, reach over head, locomotion level, and caring for others  PARTICIPATION LIMITATIONS: meal prep, cleaning, laundry, community activity, and yard work  PERSONAL FACTORS: Age, Fitness, Past/current experiences, Time since onset of injury/illness/exacerbation, and 3+ comorbidities: DM-II, obesity, elevated BP, GERD, B patellofemoral pain syndrome, B hip pain, h/o R RTC tendinitis, chronic B LBP w/o sciatica, scoliosis, diabetic peripheral neuropathy are also affecting patient's functional outcome.   REHAB POTENTIAL: Good  CLINICAL DECISION MAKING: Evolving/moderate complexity  EVALUATION COMPLEXITY: Moderate   GOALS: Goals reviewed with patient? Yes  SHORT TERM GOALS: Target date: 09/07/2023  Patient will be independent with initial HEP to improve outcomes and carryover.  Baseline:  08/08/23 -  reviewed today Goal status: MET - 08/17/23  2.  Patient will report 25% improvement in low back pain to improve QOL. Baseline: 0/10 on eval, up to at least 7/10  08/17/23 - Twinges remain unpredictable but very short-lived Goal status: MET - 08/31/23 - pt reports overall reduction in pain frequency and intensity by 50%   3.  Patient will report 25% reduction in frequency and intensity of L UE radiculopathy to improve QOL. Baseline: 3-5/10 on eval, up to 8-9/10 08/17/23  - still come and goes  Goal status: IN PROGRESS - 09/09/23 - buzzing essentially unchanged - still come and goes   LONG TERM GOALS: Target date: 10/12/2023  Patient will be independent with ongoing/advanced HEP for self-management at home.  Baseline:  Goal status: IN PROGRESS - 08/31/23 - met for current HEP, anticipate further updates  2.  Patient will report 50-75% improvement in low back pain to improve QOL.  Baseline: 0/10 on eval, up to at least 7/10 Goal status: IN PROGRESS - 08/31/23 - pt reports overall reduction in pain frequency and intensity by 50%   3.  Patient will report 50-75% reduction in frequency and intensity of L UE radiculopathy to improve QOL. Baseline: 3-5/10 on eval, up to 8-9/10 Goal status: IN PROGRESS - 08/31/23 - buzzing essentially unchanged - still come and goes   4.  Patient to demonstrate ability to achieve and maintain good spinal alignment/posturing and body mechanics needed for daily activities. Baseline: rounded shoulders, forward head, decreased lumbar lordosis, increased thoracic kyphosis, and flexed trunk posture Goal status: IN PROGRESS - 09/09/23 - improving postural awareness but continued intermittent cues necessary  5.  Patient will demonstrate functional pain free cervical and lumbar ROM to perform ADLs.   Baseline: Refer to above cervical and lumbar ROM table Goal status: IN PROGRESS - 09/09/23 - improving cervical and lumbar ROM with remaining restriction more due to tightness than pain  6.  Patient will demonstrate improved scapular, B shoulder and B LE strength to >/= 4+/5 for improved stability and ease of mobility. Baseline: Refer to above UE & LE MMT tables Goal status: IN PROGRESS - 09/09/23 - met except B lower trap for UE and Hip ABD/ADD and ER for LE  7. Patient will report </= 24% on Modified Oswestry (MCID = 12%) to demonstrate improved functional ability with decreased pain interference. Baseline: 18 / 50 = 36.0 % Goal status: IN  PROGRESS - 09/09/23 - 20 / 50 = 40.0 %  8.  Patient will report </= 16% on QuickDASH to demonstrate improved functional ability with decreased L UE radiculopathy interference. Baseline: 29.5 / 100 = 29.5 % Goal status: IN PROGRESS - 09/07/23 - 27.3/100   9.  Patient will improve FGA score by at least 4 points or to >/= 28/30 (baseline as of prior D/C from PT) to improve gait stability and reduce risk for falls.  Baseline: 22/30 - 08/10/23 Goal status: IN PROGRESS - 09/09/23 - 24/30   PLAN:  PT FREQUENCY: 2x/week  PT DURATION: 8-10 weeks  PLANNED INTERVENTIONS: 02835- PT Re-evaluation, 97750- Physical Performance Testing, 97110-Therapeutic exercises, 97530- Therapeutic activity, 97112- Neuromuscular re-education, 97535- Self Care, 02859- Manual therapy, [id number]- Gait training, [phone]- Aquatic Therapy, [phone]- Electrical stimulation (unattended), [id number]- Ultrasound, [id number]- Traction (mechanical), [id number]- Ionotophoresis 4mg /ml Dexamethasone , 79439 (1-2 muscles), [id number] (3+ muscles)- Dry Needling, Patient/Family education, Balance training, Stair training, Taping, Joint mobilization, Spinal mobilization, Cryotherapy, and Moist heat  PLAN FOR NEXT SESSION: Work on streamlining/condensing HEP while continuing to progress  core and postural flexibility and strengthening; MT +/- TPDN to address abnormal muscle tension; maybe schedule with Garnette if interested in more golf specific mobility/exercises?   Elijah CHRISTELLA Hidden, PT 09/12/2023, 10:42 AM

## 2023-09-12 NOTE — Progress Notes (Signed)
 Chief Complaint  Patient presents with   Referral    Discuss Referral     Noah Lane is a 68 y.o. male here for a skin complaint.  Duration: 10 months Location: R side of nose Pruritic? No Painful? No Drainage? No New soaps/lotions/topicals/detergents? No Trauma? No Other associated symptoms: it is not growing Therapies tried thus far: Had it removed years ago before it grew back; was told it was a skin tag  Past Medical History:  Diagnosis Date   Broken toe    Diabetes mellitus type 2 in obese    Mixed hyperlipidemia     BP 130/80 (BP Location: Left Arm, Patient Position: Sitting)   Pulse 78   Temp 98 F (36.7 C) (Oral)   Resp 16   Ht 6' 1 (1.854 m)   Wt 251 lb (113.9 kg)   SpO2 96%   BMI 33.12 kg/m  Gen: awake, alert, appearing stated age Lungs: No accessory muscle use Skin: hyperpigmented and raised lesion on the R portion of nose. No drainage, erythema, TTP, fluctuance, excoriation Psych: Age appropriate judgment and insight  Skin tag  Recurrence on nose. He will ck w ins to see if removal is covered. Discussed referral to derm. F/u pending what he finds.  The patient voiced understanding and agreement to the plan.  Mabel Mt Parshall, DO 09/12/23 9:38 AM

## 2023-09-12 NOTE — Patient Instructions (Signed)
 Call your insurance to see if they cover skin tag removals.   If you ever want to see dermatology for skin checks, let me know.   Let us  know if you need anything.

## 2023-09-14 ENCOUNTER — Ambulatory Visit: Admitting: Physical Therapy

## 2023-09-14 ENCOUNTER — Encounter: Payer: Self-pay | Admitting: Physical Therapy

## 2023-09-14 DIAGNOSIS — M6281 Muscle weakness (generalized): Secondary | ICD-10-CM

## 2023-09-14 DIAGNOSIS — R293 Abnormal posture: Secondary | ICD-10-CM

## 2023-09-14 DIAGNOSIS — R2681 Unsteadiness on feet: Secondary | ICD-10-CM

## 2023-09-14 DIAGNOSIS — M5412 Radiculopathy, cervical region: Secondary | ICD-10-CM

## 2023-09-14 DIAGNOSIS — M5459 Other low back pain: Secondary | ICD-10-CM

## 2023-09-14 NOTE — Therapy (Signed)
 OUTPATIENT PHYSICAL THERAPY TREATMENT     Patient Name: Noah Lane MRN: [id number] DOB:October 09, 1955, 68 y.o., male Today's Date: 09/14/2023  END OF SESSION:  PT End of Session - 09/14/23 0803     Visit Number 12    Date for PT Re-Evaluation 10/12/23    Authorization Type Medicare & BCBS    Progress Note Due on Visit 20    PT Start Time 0803    PT Stop Time 0847    PT Time Calculation (min) 44 min    Activity Tolerance Patient tolerated treatment well    Behavior During Therapy WFL for tasks assessed/performed               Past Medical History:  Diagnosis Date   Broken toe    Diabetes mellitus type 2 in obese    Mixed hyperlipidemia    Past Surgical History:  Procedure Laterality Date   COLON RESECTION N/A 04/27/2022   Procedure: LAPAROSCOPIC HAND ASSISTED RIGHT HEMI COLECTOMY, POSSIBLE OPEN;  Surgeon: Dasie Leonor CROME, MD;  Location: [facility];  Service: General;  Laterality: N/A;   COLONOSCOPY     HERNIA REPAIR  2012   Patient Active Problem List   Diagnosis Date Noted   Elevated blood pressure reading 05/04/2022   Colon adenocarcinoma (HCC) 05/04/2022   Gastroesophageal reflux disease 04/29/2022   Ileus, postoperative (HCC) 04/29/2022   Hypokalemia 04/28/2022   Hyperlipidemia 04/28/2022   Colonic obstruction (HCC) 04/25/2022   Hyponatremia 04/25/2022   OSA on CPAP 12/03/2021   Obesity (BMI 30.0-34.9) 12/03/2021   Mixed hyperlipidemia 10/20/2021   Strain of lumbar region 10/16/2021   Strain of calf muscle 07/22/2021   Patellofemoral pain syndrome of both knees 02/24/2021   Rotator cuff tendinitis, right 12/28/2019   Hip pain, bilateral 05/28/2019   Chronic bilateral low back pain without sciatica 05/28/2019   Type 2 diabetes mellitus with obesity (HCC) 05/28/2019   Erectile dysfunction 05/28/2019   Pain and swelling of left knee 03/12/2019   Lateral epicondylitis, right elbow 03/15/2017   Diverticulosis of large intestine without hemorrhage  02/25/2014    PCP: Frann Mabel Mt, DO   REFERRING PROVIDER: Arvell Evalene SAUNDERS, DO   REFERRING DIAG: [phone] (ICD-10-CM) - Degeneration of intervertebral disc of lumbar region, unspecified whether pain present   THERAPY DIAG:  Other low back pain  Radiculopathy, cervical region  Muscle weakness (generalized)  Abnormal posture  Unsteadiness on feet  RATIONALE FOR EVALUATION AND TREATMENT: Rehabilitation  ONSET DATE: early April 2025  NEXT MD VISIT: None scheduled with referring provider   SUBJECTIVE:  SUBJECTIVE STATEMENT: Patient reports he is already warmed up from walking and has stretched this morning, therefore he requested to defer the warmup.  EVAL:  Pt reports he started getting LBP, mostly on R side, ~2 months ago w/o known MOI.  Pain first noted while rolling over in bed.  Pain comes in strikes w/o predictable trigger.  Pain is momentary lasting a few minutes when present.  He also reports recent onset of L UE numbness and tingling that he says Dr. Frann recommended he mention to PT.  UE radiculopathy also unpredictable in trigger and varies in duration.  He notes constant stiffness in neck, but denies pain.  He also reports balance issues, esp initially on standing, which he attributes to a weak core and his neuropathy.  PAIN: Are you having pain? No and Yes: NPRS scale: 0/10   Pain location: R low back  Pain description: exercise soreness  Aggravating factors: unpredictable  Relieving factors: remain still until pain resolves   Are you having pain? Yes: NPRS scale: comes and goes at all types of intensities  Pain location: L UE down to radial side of hand, occasional in thumb  Pain description: vibration (reminds him of a TENS unit), stiffness in neck   Aggravating factors: unpredictable  Relieving factors: heating pads, stretches   PERTINENT HISTORY:  DM-II, obesity, elevated BP, GERD, B patellofemoral pain syndrome, B hip pain, h/o R RTC tendinitis, chronic B LBP w/o sciatica, scoliosis, diabetic peripheral neuropathy  PRECAUTIONS: None  RED FLAGS: None  WEIGHT BEARING RESTRICTIONS: No  FALLS:  Has patient fallen in last 6 months? 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 I would like the shoulder thing to go away. Better balance.   OBJECTIVE: (objective measures completed at initial evaluation unless otherwise dated)  DIAGNOSTIC FINDINGS:  10/16/21 - DG lumbar spine IMPRESSION: No acute fracture or subluxation of the lumbar spine.  PATIENT SURVEYS:  Modified Oswestry 18 / 50 = 36.0 %, moderate disability   Quick Dash 29.5 / 100 = 29.5 %  COGNITION:  Overall cognitive status: History of cognitive impairments - at baseline (mostly STM memory recall)    SENSATION: WFL B peripheral neuropathy in feet  POSTURE:  rounded shoulders, forward head, decreased lumbar lordosis, increased thoracic kyphosis, and flexed trunk   PALPATION: Increased muscle tension in L>R UT and LS as well as R>L lumbar paraspinals and glutes/piriformis  CERVICAL ROM:   Active ROM Eval 09/09/23  Flexion 49 52  Extension 42 48  Right lateral flexion 24 32  Left lateral flexion 16 24  Right rotation 51 61  Left rotation 52 58   (Blank rows =  not tested)  UPPER EXTREMITY MMT:  MMT Right eval Left eval R 09/09/23 L 09/09/23  Shoulder flexion 4+ 4- 5 4+  Shoulder extension 4+ 4+ 5 5  Shoulder abduction 4+ 4 5 5   Shoulder adduction      Shoulder internal rotation 4+ 4+ 5 5  Shoulder external rotation 4  4 4+ 4+  Middle trapezius 4- 4- 4+ 4+  Lower trapezius 3+ 3+ 4 4  Grip strength       (Blank rows = not tested)  LUMBAR ROM:   Active  Eval 09/09/23  Flexion Hands to mid shins Fingertips to toes  Extension 25% limited WFL  Right lateral flexion Hand to femoral condyle - tight Hand to femoral condyle - heavy tightness  Left lateral flexion Hand to lateral knee - twinge in R low back Hand to femoral condyle - heavy tightness  Right rotation [facility] WFL  Left rotation [facility] WFL  (Blank rows = not tested)  MUSCLE LENGTH: Hamstrings: mod/severe tight L>R ITB: mod/severe tight B Piriformis: mild/mod tight R>L Hip flexors: mod tight B Quads: mod tight R>L  LOWER EXTREMITY ROM:    Grossly WFL  LOWER EXTREMITY MMT:    MMT Right eval Left eval R 09/09/23 L 09/09/23  Hip flexion 4 4 4+ 4+  Hip extension 4- 4- 4+ 4+  Hip abduction 4- 4- 4 4+  Hip adduction 4- 3+ 4+ 4  Hip internal rotation 5 4+ 5 (limited ROM) 5 (limited ROM)  Hip external rotation 4- 3+ 4- 4 (limited ROM)  Knee flexion 5 4+ 5 5  Knee extension 5 5 5 5   Ankle dorsiflexion 4+ 4+ 5 5  Ankle plantarflexion      Ankle inversion      Ankle eversion       (Blank rows = not tested)  LUMBAR SPECIAL TESTS:  Straight leg raise test: Negative and Slump test: Negative  FUNCTIONAL TESTS: (08/10/23) 5 times sit to stand: 15.75 sec Functional gait assessment: 22/30; indicated medium risk for falls Berg balance scale: 50/56; indicates moderate (>50%) risk for falls    09/09/23: FGA = 24/30, indicative of medium fall risk   Scores as of D/C from PT in July 2024: Berg: 54/56 FGA: 28/30   TODAY'S TREATMENT:   09/14/23 NEUROMUSCULAR RE-EDUCATION: To improve  balance, coordination, kinesthesia, posture, and proprioception. Functional squat + OH raise x 10, with 3# in B hands x 10 Multifidi walkout with GTB resistance x 10 B - cues to keep scapular and core muscles engaged Standing hip hinge with pole along spine 2 x 10 Sustained bridge + GTB clam/hip ABD 2 x 10  THERAPEUTIC ACTIVITIES: To improve functional performance.  Demonstration, verbal and tactile cues throughout for technique. Standing lift and chop with GTB 2 x 10 B - 1st sets with focus on UE diagonal with stable core, 2nd sets adding full trunk rotation (working toward core activation with mechanics of golf swing)  THERAPEUTIC EXERCISE: To improve strength and endurance.  Demonstration, verbal and tactile cues throughout for technique.  S/L GTB clam 2 x 10   09/12/23 THERAPEUTIC EXERCISE: To improve strength and endurance.   Elliptical - L1.0 x 5 min  NEUROMUSCULAR RE-EDUCATION: To improve balance, coordination, kinesthesia, posture, proprioception, and reduce fall risk.  Tandem stance 2 x 30 with each foot forward - intermittent UE support on counter  Tandem gait: fwd 4 x 10 ft, backward 2 x 10 ft - intermittent UE support on counter  Staggered stance progressing to tandem stance + GTB pallof press x 10 bil with each foot fwd Corner balance progression with tandem stance on firm surface with arms at sides            Eyes open: static stance x 30 sec horiz head turns x 5 vertical head nods x 5 trunk rotation x  5 Eyes closed: static stance x 15-20 sec   09/09/23 THERAPEUTIC EXERCISE: To improve strength and endurance.  Demonstration, verbal and tactile cues throughout for technique.  NuStep - L6 x 8 min  PHYSICAL PERFORMANCE TEST or MEASUREMENT: Modified Oswestry: 20 / 50 = 40.0 % Functional Gait  Assessment  Gait Level Surface Walks 20 ft in less than 5.5 sec, no assistive devices, good speed, no evidence for imbalance, normal gait pattern, deviates no more than 6 in  outside of the 12 in walkway width.   Change in Gait Speed Able to smoothly change walking speed without loss of balance or gait deviation. Deviate no more than 6 in outside of the 12 in walkway width.   Gait with Horizontal Head Turns Performs head turns smoothly with no change in gait. Deviates no more than 6 in outside 12 in walkway width   Gait with Vertical Head Turns Performs head turns with no change in gait. Deviates no more than 6 in outside 12 in walkway width.   Gait and Pivot Turn Pivot turns safely within 3 sec and stops quickly with no loss of balance.   Step Over Obstacle Is able to step over one shoe box (4.5 in total height) without changing gait speed. No evidence of imbalance.   Gait with Narrow Base of Support Ambulates 4-7 steps.   Gait with Eyes Closed Walks 20 ft, uses assistive device, slower speed, mild gait deviations, deviates 6-10 in outside 12 in walkway width. Ambulates 20 ft in less than 9 sec but greater than 7 sec.   Ambulating Backwards Walks 20 ft, uses assistive device, slower speed, mild gait deviations, deviates 6-10 in outside 12 in walkway width.   Steps Alternating feet, must use rail.   Total Score 24   FGA comment: 19-24 = medium risk fall      THERAPEUTIC ACTIVITIES: To improve functional performance.  Demonstration, verbal and tactile cues throughout for technique. Cervical & lumbar ROM assessment UE/LE MMT Goal assessment  SELF CARE:  Review of progress with PT, status of goals, and plans for ongoing PT POC requesting patient input and feedback to ensure PT is addressing all of his concerns.   09/07/23 NEUROMUSCULAR RE-EDUCATION: To improve coordination, kinesthesia and proprioception.  Quadruped cat/cow x 10 Quadruped fire hydrants x 10 B- using hand grips Bird dog x 10 B Ab sets orange pball 10x5 LE pedals + TRA 2x15  THERAPEUTIC EXERCISE: To improve strength, endurance, ROM, and flexibility.  Seated KTOS piriformis stretch x 30  B  THERAPEUTIC ACTIVITIES: To improve functional performance.  Standing trunk rotation green TB x 15 B Standing pallof press GTB x 20 in semi tandem Standing chops GTB x 20 in semi tandem   08/31/2023 THERAPEUTIC EXERCISE: To improve strength and endurance.  Demonstration, verbal and tactile cues throughout for technique.  NuStep - L6 x 8 min  NEUROMUSCULAR RE-EDUCATION: To improve coordination, kinesthesia, posture, and proprioception. Quadruped thread and reach 2 x 10 bil Quadruped hip ABD & ER 2 x 10 bil, yoga block balanced on low back to promote neutral spine Prone from knees over green Pball - cervical retraction, thoracic extension + I's & T's x 10 each, cues for neutral cervical spine alignment avoiding flexion or extension   PATIENT EDUCATION:  Education details: progress with PT and ongoing PT POC  Person educated: Patient Education method: Explanation Education comprehension: verbalized understanding  HOME EXERCISE PROGRAM: *Pt has [email or web address] access but also prefers printed handouts.  Access Code: [employer]  URL: [email or web address] Date: 09/14/2023 Prepared by: Elijah Hidden  Exercises - Supine Hamstring Stretch with Strap  - 1-2 x daily - 7 x weekly - 3 reps - 30 sec hold - Supine Iliotibial Band Stretch with Strap  - 1-2 x daily - 7 x weekly - 3 reps - 30 sec hold - Prone Hip Flexor & Quad Stretch with Strap  - 1-2 x daily - 7 x weekly - 3 reps - 30 sec hold - Hip Flexor Stretch with Strap on Table  - 1-2 x daily - 7 x weekly - 3 reps - 30 sec hold - Supine Gluteus Stretch  - 1-2 x daily - 7 x weekly - 3 reps - 30 sec hold - Supine Piriformis Stretch with Foot on Ground  - 1-2 x daily - 7 x weekly - 3 reps - 30 sec hold - Supine Lower Trunk Rotation  - 1-2 x daily - 7 x weekly - 2 sets - 5 reps - 10 sec hold - Supine Hip Adduction Isometric with Ball  - 1-2 x daily - 7 x weekly - 2 sets - 10 reps - 5 sec hold - Doorway Pec Stretch at 90 Degrees  Abduction  - 1-2 x daily - 7 x weekly - 3 reps - 30 sec hold - Seated Scapular Retraction with External Rotation  - 1-2 x daily - 7 x weekly - 2 sets - 10 reps - 3-5 sec hold - Hooklying Single Leg Bent Knee Fallouts with Resistance  - 1 x daily - 3 x weekly - 2 sets - 10 reps - 3 sec hold - Supine Shoulder Horizontal Abduction with Resistance  - 1 x daily - 3 x weekly - 2 sets - 10 reps - 3 sec hold - Standing Shoulder Row with Anchored Resistance  - 1 x daily - 3 x weekly - 2 sets - 10 reps - 5 sec hold - Scapular Retraction with Resistance Advanced  - 1 x daily - 3 x weekly - 2 sets - 10 reps - 5 sec hold - Standing Shoulder Horizontal Abduction with Resistance  - 1 x daily - 3 x weekly - 2 sets - 10 reps - 3 sec hold - Shoulder External Rotation and Scapular Retraction with Resistance  - 1 x daily - 3 x weekly - 2 sets - 10 reps - 3-5 sec hold - Standing Anti-Rotation Press with Anchored Resistance  - 1 x daily - 3 x weekly - 2 sets - 10 reps - 3 sec hold - Standing Hip Flexion with Anchored Resistance and Chair Support  - 1 x daily - 3 x weekly - 2 sets - 10 reps - 3 sec hold - Standing Hip Adduction with Resistance  - 1 x daily - 3 x weekly - 2 sets - 10 reps - 3 sec hold - Standing Hip Extension with Resistance  - 1 x daily - 3 x weekly - 2 sets - 10 reps - 3 sec hold - Standing Hip Abduction with Resistance  - 1 x daily - 3 x weekly - 2 sets - 10 reps - 3 sec hold - Seated Hamstring Curl with Anchored Resistance  - 1 x daily - 3 x weekly - 2 sets - 10 reps - 3-5 sec hold - Sitting Knee Extension with Resistance  - 1 x daily - 3 x weekly - 2 sets - 10 reps - 3-5 sec hold - Reverse Chop with Resistance  - 1 x daily -  3 x weekly - 2 sets - 10 reps - Standing Trunk Rotation with Resistance  - 1 x daily - 3 x weekly - 2 sets - 10 reps - Sidelying Thoracic Rotation with Open Book  - 1 x daily - 3 x weekly - 2 sets - 10 reps - Quadruped With Rotation: Thread The Needle  - 1 x daily - 3 x weekly -  2 sets - 10 reps - 3 sec hold - Quadruped Hip Abduction and External Rotation  - 1 x daily - 3 x weekly - 2 sets - 10 reps - 3 sec hold - Prone Shoulder Extension on Swiss Ball  - 1 x daily - 3 x weekly - 2 sets - 10 reps - 3 sec hold - Prone Middle Trapezius Strengthening on Swiss Ball  - 1 x daily - 3 x weekly - 2 sets - 10 reps - 3 sec hold - Anti-Rotation Sidestepping with Resistance  - 1 x daily - 3 x weekly - 2 sets - 10 reps - 3 sec hold - Clamshell with Resistance  - 1 x daily - 3 x weekly - 2 sets - 10 reps - 3-5 sec hold - Bridge with Hip Abduction and Resistance  - 1 x daily - 3 x weekly - 2 sets - 10 reps - 5 sec hold - Standing Hip Hinge with Dowel  - 1 x daily - 3 x weekly - 2 sets - 10 reps - 3 sec hold   ASSESSMENT:  CLINICAL IMPRESSION: Noah Lane reports he will be accompanying his wife to a [employer] event where he will likely be hitting a few balls therefore focused and postural and core control with components of golf swing.  Continued to progress core/lumbopelvic strengthening targeting areas of ongoing weakness identified on recent MMT.  Patient continues to request home instructions for all new exercises, insisting that rotating through different exercises help prevent the monotony and keep him more motivated to work on his HEP.  Saman will benefit from continued skilled PT to address ongoing ROM and strength deficits to improve mobility and activity tolerance with decreased pain interference and decreased risk for falls.    EVAL: Noah Lane is a 68 y.o. male who was referred to physical therapy for evaluation and treatment for R sided LBP 2 lumbar DDD.  Patient also c/o intermittent L UE radiculopathy (vibration sensation).  Patient reports onset of R sided LBP pain beginning ~2 months ago w/o know MOI.  Triggers for LBP and R UE radiculopathy are unpredictable.  Patient has deficits in cervical and lumbar ROM, proximal B LE flexibility, L>R UE/scapular strength, B LE  strength, core/postural weakness with abnormal posture, and abnormal muscle tension which are interfering with ADLs and are impacting quality of life.  On Modified Oswestry patient scored 18/50 demonstrating 36% or moderate disability.  On QuickDASH patient scored 29.5/100 demonstrating 29.5% disability.  He also notes impaired balance which will be formally assessed next visit.  Danni will benefit from skilled PT to address above deficits to improve mobility and activity tolerance with decreased pain interference.     OBJECTIVE IMPAIRMENTS: decreased activity tolerance, decreased balance, decreased knowledge of condition, decreased mobility, difficulty walking, decreased ROM, decreased strength, increased fascial restrictions, impaired perceived functional ability, increased muscle spasms, impaired flexibility, impaired sensation, impaired UE functional use, improper body mechanics, postural dysfunction, and pain.   ACTIVITY LIMITATIONS: carrying, lifting, bending, sitting, standing, squatting, sleeping, stairs, transfers, bed mobility, reach over head, locomotion level, and  caring for others  PARTICIPATION LIMITATIONS: meal prep, cleaning, laundry, community activity, and yard work  PERSONAL FACTORS: Age, Fitness, Past/current experiences, Time since onset of injury/illness/exacerbation, and 3+ comorbidities: DM-II, obesity, elevated BP, GERD, B patellofemoral pain syndrome, B hip pain, h/o R RTC tendinitis, chronic B LBP w/o sciatica, scoliosis, diabetic peripheral neuropathy are also affecting patient's functional outcome.   REHAB POTENTIAL: Good  CLINICAL DECISION MAKING: Evolving/moderate complexity  EVALUATION COMPLEXITY: Moderate   GOALS: Goals reviewed with patient? Yes  SHORT TERM GOALS: Target date: 09/07/2023  Patient will be independent with initial HEP to improve outcomes and carryover.  Baseline:  08/08/23 - reviewed today Goal status: MET - 08/17/23  2.  Patient will report 25%  improvement in low back pain to improve QOL. Baseline: 0/10 on eval, up to at least 7/10  08/17/23 - Twinges remain unpredictable but very short-lived Goal status: MET - 08/31/23 - pt reports overall reduction in pain frequency and intensity by 50%   3.  Patient will report 25% reduction in frequency and intensity of L UE radiculopathy to improve QOL. Baseline: 3-5/10 on eval, up to 8-9/10 08/17/23 - still come and goes  Goal status: IN PROGRESS - 09/09/23 - buzzing essentially unchanged - still come and goes   LONG TERM GOALS: Target date: 10/12/2023  Patient will be independent with ongoing/advanced HEP for self-management at home.  Baseline:  Goal status: IN PROGRESS - 08/31/23 - met for current HEP, anticipate further updates  2.  Patient will report 50-75% improvement in low back pain to improve QOL.  Baseline: 0/10 on eval, up to at least 7/10 Goal status: IN PROGRESS - 08/31/23 - pt reports overall reduction in pain frequency and intensity by 50%   3.  Patient will report 50-75% reduction in frequency and intensity of L UE radiculopathy to improve QOL. Baseline: 3-5/10 on eval, up to 8-9/10 Goal status: IN PROGRESS - 08/31/23 - buzzing essentially unchanged - still come and goes   4.  Patient to demonstrate ability to achieve and maintain good spinal alignment/posturing and body mechanics needed for daily activities. Baseline: rounded shoulders, forward head, decreased lumbar lordosis, increased thoracic kyphosis, and flexed trunk posture Goal status: IN PROGRESS - 09/09/23 - improving postural awareness but continued intermittent cues necessary  5.  Patient will demonstrate functional pain free cervical and lumbar ROM to perform ADLs.   Baseline: Refer to above cervical and lumbar ROM table Goal status: IN PROGRESS - 09/09/23 - improving cervical and lumbar ROM with remaining restriction more due to tightness than pain  6.  Patient will demonstrate improved scapular, B shoulder and B  LE strength to >/= 4+/5 for improved stability and ease of mobility. Baseline: Refer to above UE & LE MMT tables Goal status: IN PROGRESS - 09/09/23 - met except B lower trap for UE and Hip ABD/ADD and ER for LE  7. Patient will report </= 24% on Modified Oswestry (MCID = 12%) to demonstrate improved functional ability with decreased pain interference. Baseline: 18 / 50 = 36.0 % Goal status: IN PROGRESS - 09/09/23 - 20 / 50 = 40.0 %  8.  Patient will report </= 16% on QuickDASH to demonstrate improved functional ability with decreased L UE radiculopathy interference. Baseline: 29.5 / 100 = 29.5 % Goal status: IN PROGRESS - 09/07/23 - 27.3/100   9.  Patient will improve FGA score by at least 4 points or to >/= 28/30 (baseline as of prior D/C from PT) to improve gait stability  and reduce risk for falls.  Baseline: 22/30 - 08/10/23 Goal status: IN PROGRESS - 09/09/23 - 24/30   PLAN:  PT FREQUENCY: 2x/week  PT DURATION: 8-10 weeks  PLANNED INTERVENTIONS: 02835- PT Re-evaluation, 97750- Physical Performance Testing, 97110-Therapeutic exercises, 97530- Therapeutic activity, 97112- Neuromuscular re-education, 97535- Self Care, 02859- Manual therapy, [phone]- Gait training, [phone]- Aquatic Therapy, [phone]- Electrical stimulation (unattended), [id number]- Ultrasound, [id number]- Traction (mechanical), [id number]- Ionotophoresis 4mg /ml Dexamethasone , 79439 (1-2 muscles), 20561 (3+ muscles)- Dry Needling, Patient/Family education, Balance training, Stair training, Taping, Joint mobilization, Spinal mobilization, Cryotherapy, and Moist heat  PLAN FOR NEXT SESSION: Work on streamlining/condensing HEP while continuing to progress core and postural flexibility and strengthening; MT +/- TPDN to address abnormal muscle tension; maybe schedule with Garnette if interested in more golf specific mobility/exercises?   Elijah CHRISTELLA Hidden, PT 09/14/2023, 8:48 AM

## 2023-09-21 ENCOUNTER — Other Ambulatory Visit

## 2023-09-21 ENCOUNTER — Encounter: Payer: Self-pay | Admitting: Physical Therapy

## 2023-09-21 ENCOUNTER — Ambulatory Visit: Admitting: Physical Therapy

## 2023-09-21 DIAGNOSIS — M5412 Radiculopathy, cervical region: Secondary | ICD-10-CM

## 2023-09-21 DIAGNOSIS — M5459 Other low back pain: Secondary | ICD-10-CM

## 2023-09-21 DIAGNOSIS — M6281 Muscle weakness (generalized): Secondary | ICD-10-CM

## 2023-09-21 NOTE — Therapy (Signed)
 OUTPATIENT PHYSICAL THERAPY TREATMENT     Patient Name: Noah Lane MRN: [id number] DOB:03-Jul-1955, 68 y.o., male Today's Date: 09/21/2023  END OF SESSION:  PT End of Session - 09/21/23 0804     Visit Number 13    Date for PT Re-Evaluation 10/12/23    Authorization Type Medicare & BCBS    Progress Note Due on Visit 20    PT Start Time 0804    PT Stop Time 0850    PT Time Calculation (min) 46 min    Activity Tolerance Patient tolerated treatment well    Behavior During Therapy WFL for tasks assessed/performed               Past Medical History:  Diagnosis Date   Broken toe    Diabetes mellitus type 2 in obese    Mixed hyperlipidemia    Past Surgical History:  Procedure Laterality Date   COLON RESECTION N/A 04/27/2022   Procedure: LAPAROSCOPIC HAND ASSISTED RIGHT HEMI COLECTOMY, POSSIBLE OPEN;  Surgeon: Dasie Leonor CROME, MD;  Location: [facility];  Service: General;  Laterality: N/A;   COLONOSCOPY     HERNIA REPAIR  2012   Patient Active Problem List   Diagnosis Date Noted   Elevated blood pressure reading 05/04/2022   Colon adenocarcinoma (HCC) 05/04/2022   Gastroesophageal reflux disease 04/29/2022   Ileus, postoperative (HCC) 04/29/2022   Hypokalemia 04/28/2022   Hyperlipidemia 04/28/2022   Colonic obstruction (HCC) 04/25/2022   Hyponatremia 04/25/2022   OSA on CPAP 12/03/2021   Obesity (BMI 30.0-34.9) 12/03/2021   Mixed hyperlipidemia 10/20/2021   Strain of lumbar region 10/16/2021   Strain of calf muscle 07/22/2021   Patellofemoral pain syndrome of both knees 02/24/2021   Rotator cuff tendinitis, right 12/28/2019   Hip pain, bilateral 05/28/2019   Chronic bilateral low back pain without sciatica 05/28/2019   Type 2 diabetes mellitus with obesity (HCC) 05/28/2019   Erectile dysfunction 05/28/2019   Pain and swelling of left knee 03/12/2019   Lateral epicondylitis, right elbow 03/15/2017   Diverticulosis of large intestine without hemorrhage  02/25/2014    PCP: Frann Mabel Mt, DO   REFERRING PROVIDER: Arvell Evalene SAUNDERS, DO   REFERRING DIAG: [phone] (ICD-10-CM) - Degeneration of intervertebral disc of lumbar region, unspecified whether pain present   THERAPY DIAG:  Other low back pain  Radiculopathy, cervical region  Muscle weakness (generalized)  RATIONALE FOR EVALUATION AND TREATMENT: Rehabilitation  ONSET DATE: early April 2025  NEXT MD VISIT: None scheduled with referring provider   SUBJECTIVE:  SUBJECTIVE STATEMENT: Pt reports he is feeling okay. He is a bit stiff but, he did do some stretching this morning before walking his dogs.   EVAL:  Pt reports he started getting LBP, mostly on R side, ~2 months ago w/o known MOI.  Pain first noted while rolling over in bed.  Pain comes in strikes w/o predictable trigger.  Pain is momentary lasting a few minutes when present.  He also reports recent onset of L UE numbness and tingling that he says Dr. Frann recommended he mention to PT.  UE radiculopathy also unpredictable in trigger and varies in duration.  He notes constant stiffness in neck, but denies pain.  He also reports balance issues, esp initially on standing, which he attributes to a weak core and his neuropathy.  PAIN: Are you having pain? No and Yes: NPRS scale: 0/10   Pain location: R low back  Pain description: exercise soreness  Aggravating factors: unpredictable  Relieving factors: remain still until pain resolves   Are you having pain? Yes: NPRS scale: comes and goes at all types of intensities  Pain location: L UE down to radial side of hand, occasional in thumb  Pain description: vibration (reminds him of a TENS unit), stiffness in neck  Aggravating factors: unpredictable  Relieving  factors: heating pads, stretches   PERTINENT HISTORY:  DM-II, obesity, elevated BP, GERD, B patellofemoral pain syndrome, B hip pain, h/o R RTC tendinitis, chronic B LBP w/o sciatica, scoliosis, diabetic peripheral neuropathy  PRECAUTIONS: None  RED FLAGS: None  WEIGHT BEARING RESTRICTIONS: No  FALLS:  Has patient fallen in last 6 months? 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 I would like the shoulder thing to go away. Better balance.   OBJECTIVE: (objective measures completed at initial evaluation unless otherwise dated)  DIAGNOSTIC FINDINGS:  10/16/21 - DG lumbar spine IMPRESSION: No acute fracture or subluxation of the lumbar spine.  PATIENT SURVEYS:  Modified Oswestry 18 / 50 = 36.0 %, moderate disability   Quick Dash 29.5 / 100 = 29.5 %  COGNITION:  Overall cognitive status: History of cognitive impairments - at baseline (mostly STM memory recall)    SENSATION: WFL B peripheral neuropathy in feet  POSTURE:  rounded shoulders, forward head, decreased lumbar lordosis, increased thoracic kyphosis, and flexed trunk   PALPATION: Increased muscle tension in L>R UT and LS as well as R>L lumbar paraspinals and glutes/piriformis  CERVICAL ROM:   Active ROM Eval 09/09/23  Flexion 49 52  Extension 42 48  Right lateral flexion 24 32  Left lateral flexion 16 24  Right rotation 51 61  Left rotation 52 58   (Blank rows = not tested)  UPPER EXTREMITY MMT:  MMT Right  eval Left eval R 09/09/23 L 09/09/23  Shoulder flexion 4+ 4- 5 4+  Shoulder extension 4+ 4+ 5 5  Shoulder abduction 4+ 4 5 5   Shoulder adduction      Shoulder internal rotation 4+ 4+ 5 5  Shoulder  external rotation 4 4 4+ 4+  Middle trapezius 4- 4- 4+ 4+  Lower trapezius 3+ 3+ 4 4  Grip strength       (Blank rows = not tested)  LUMBAR ROM:   Active  Eval 09/09/23  Flexion Hands to mid shins Fingertips to toes  Extension 25% limited WFL  Right lateral flexion Hand to femoral condyle - tight Hand to femoral condyle - heavy tightness  Left lateral flexion Hand to lateral knee - twinge in R low back Hand to femoral condyle - heavy tightness  Right rotation [facility] WFL  Left rotation [facility] WFL  (Blank rows = not tested)  MUSCLE LENGTH: Hamstrings: mod/severe tight L>R ITB: mod/severe tight B Piriformis: mild/mod tight R>L Hip flexors: mod tight B Quads: mod tight R>L  LOWER EXTREMITY ROM:    Grossly WFL  LOWER EXTREMITY MMT:    MMT Right eval Left eval R 09/09/23 L 09/09/23  Hip flexion 4 4 4+ 4+  Hip extension 4- 4- 4+ 4+  Hip abduction 4- 4- 4 4+  Hip adduction 4- 3+ 4+ 4  Hip internal rotation 5 4+ 5 (limited ROM) 5 (limited ROM)  Hip external rotation 4- 3+ 4- 4 (limited ROM)  Knee flexion 5 4+ 5 5  Knee extension 5 5 5 5   Ankle dorsiflexion 4+ 4+ 5 5  Ankle plantarflexion      Ankle inversion      Ankle eversion       (Blank rows = not tested)  LUMBAR SPECIAL TESTS:  Straight leg raise test: Negative and Slump test: Negative  FUNCTIONAL TESTS: (08/10/23) 5 times sit to stand: 15.75 sec Functional gait assessment: 22/30; indicated medium risk for falls Berg balance scale: 50/56; indicates moderate (>50%) risk for falls    09/09/23: FGA = 24/30, indicative of medium fall risk   Scores as of D/C from PT in July 2024: Berg: 54/56 FGA: 28/30   TODAY'S TREATMENT:  09/21/23 THERAPEUTIC EXERCISE: To improve strength and endurance.  Demonstration, verbal and tactile  cues throughout for technique.  Elliptical- L1x59min  Open Book Thoacolumbar Stretch w/ top knee bent x 10 B- 3-5 Sustained bridge w/ GTB + hip ABD 2x10  Supine Hip Add w/ ball squeeze 2x10 THERAPEUTIC ACTIVITIES: To improve functional performance.  Demonstration, verbal and tactile cues throughout for technique. Standing Chop w/ GTB 2x10 B Standing Lift w/ full trunk rotation w/ GTB 2x10  NEUROMUSCULAR RE-EDUCATION: To improve balance, coordination, kinesthesia, posture, and proprioception. Stationary Lunges x10 B- pt has small LOB w/ L foot fwd    09/14/23 NEUROMUSCULAR RE-EDUCATION: To improve balance, coordination, kinesthesia, posture, and proprioception. Functional squat + OH raise x 10, with 3# in B hands x 10 Multifidi walkout with GTB resistance x 10 B - cues to keep scapular and core muscles engaged Standing hip hinge with pole along spine 2 x 10 Sustained bridge + GTB clam/hip ABD 2 x 10  THERAPEUTIC ACTIVITIES: To improve functional performance.  Demonstration, verbal and tactile cues throughout for technique. Standing lift and chop with GTB 2 x 10 B - 1st sets with focus on UE diagonal with stable core, 2nd sets adding full trunk rotation (working toward core activation with mechanics of golf swing)  THERAPEUTIC EXERCISE: To improve strength and endurance.  Demonstration, verbal and tactile cues throughout for technique.  S/L GTB clam 2 x 10   09/12/23 THERAPEUTIC EXERCISE: To improve strength and endurance.   Elliptical - L1.0 x 5 min  NEUROMUSCULAR RE-EDUCATION: To  improve balance, coordination, kinesthesia, posture, proprioception, and reduce fall risk.  Tandem stance 2 x 30 with each foot forward - intermittent UE support on counter  Tandem gait: fwd 4 x 10 ft, backward 2 x 10 ft - intermittent UE support on counter  Staggered stance progressing to tandem stance + GTB pallof press x 10 bil with each foot fwd Corner balance progression with tandem stance on firm  surface with arms at sides            Eyes open: static stance x 30 sec horiz head turns x 5 vertical head nods x 5 trunk rotation x 5 Eyes closed: static stance x 15-20 sec   09/09/23 THERAPEUTIC EXERCISE: To improve strength and endurance.  Demonstration, verbal and tactile cues throughout for technique.  NuStep - L6 x 8 min  PHYSICAL PERFORMANCE TEST or MEASUREMENT: Modified Oswestry: 20 / 50 = 40.0 % Functional Gait  Assessment  Gait Level Surface Walks 20 ft in less than 5.5 sec, no assistive devices, good speed, no evidence for imbalance, normal gait pattern, deviates no more than 6 in outside of the 12 in walkway width.   Change in Gait Speed Able to smoothly change walking speed without loss of balance or gait deviation. Deviate no more than 6 in outside of the 12 in walkway width.   Gait with Horizontal Head Turns Performs head turns smoothly with no change in gait. Deviates no more than 6 in outside 12 in walkway width   Gait with Vertical Head Turns Performs head turns with no change in gait. Deviates no more than 6 in outside 12 in walkway width.   Gait and Pivot Turn Pivot turns safely within 3 sec and stops quickly with no loss of balance.   Step Over Obstacle Is able to step over one shoe box (4.5 in total height) without changing gait speed. No evidence of imbalance.   Gait with Narrow Base of Support Ambulates 4-7 steps.   Gait with Eyes Closed Walks 20 ft, uses assistive device, slower speed, mild gait deviations, deviates 6-10 in outside 12 in walkway width. Ambulates 20 ft in less than 9 sec but greater than 7 sec.   Ambulating Backwards Walks 20 ft, uses assistive device, slower speed, mild gait deviations, deviates 6-10 in outside 12 in walkway width.   Steps Alternating feet, must use rail.   Total Score 24   FGA comment: 19-24 = medium risk fall      THERAPEUTIC ACTIVITIES: To improve functional performance.  Demonstration, verbal and tactile cues throughout for  technique. Cervical & lumbar ROM assessment UE/LE MMT Goal assessment  SELF CARE:  Review of progress with PT, status of goals, and plans for ongoing PT POC requesting patient input and feedback to ensure PT is addressing all of his concerns.   09/07/23 NEUROMUSCULAR RE-EDUCATION: To improve coordination, kinesthesia and proprioception.  Quadruped cat/cow x 10 Quadruped fire hydrants x 10 B- using hand grips Bird dog x 10 B Ab sets orange pball 10x5 LE pedals + TRA 2x15  THERAPEUTIC EXERCISE: To improve strength, endurance, ROM, and flexibility.  Seated KTOS piriformis stretch x 30 B  THERAPEUTIC ACTIVITIES: To improve functional performance.  Standing trunk rotation green TB x 15 B Standing pallof press GTB x 20 in semi tandem Standing chops GTB x 20 in semi tandem   08/31/2023 THERAPEUTIC EXERCISE: To improve strength and endurance.  Demonstration, verbal and tactile cues throughout for technique.  NuStep - L6 x 8 min  NEUROMUSCULAR RE-EDUCATION: To improve coordination, kinesthesia, posture, and proprioception. Quadruped thread and reach 2 x 10 bil Quadruped hip ABD & ER 2 x 10 bil, yoga block balanced on low back to promote neutral spine Prone from knees over green Pball - cervical retraction, thoracic extension + I's & T's x 10 each, cues for neutral cervical spine alignment avoiding flexion or extension   PATIENT EDUCATION:  Education details: progress with PT and ongoing PT POC  Person educated: Patient Education method: Explanation Education comprehension: verbalized understanding  HOME EXERCISE PROGRAM: *Pt has MedBridgeGO access but also prefers printed handouts.  Access Code: [email or web address] URL: [email or web address] Date: 09/14/2023 Prepared by: Elijah Hidden  Exercises - Supine Hamstring Stretch with Strap  - 1-2 x daily - 7 x weekly - 3 reps - 30 sec hold - Supine Iliotibial Band Stretch with Strap  - 1-2 x daily - 7 x weekly - 3 reps - 30 sec  hold - Prone Hip Flexor & Quad Stretch with Strap  - 1-2 x daily - 7 x weekly - 3 reps - 30 sec hold - Hip Flexor Stretch with Strap on Table  - 1-2 x daily - 7 x weekly - 3 reps - 30 sec hold - Supine Gluteus Stretch  - 1-2 x daily - 7 x weekly - 3 reps - 30 sec hold - Supine Piriformis Stretch with Foot on Ground  - 1-2 x daily - 7 x weekly - 3 reps - 30 sec hold - Supine Lower Trunk Rotation  - 1-2 x daily - 7 x weekly - 2 sets - 5 reps - 10 sec hold - Supine Hip Adduction Isometric with Ball  - 1-2 x daily - 7 x weekly - 2 sets - 10 reps - 5 sec hold - Doorway Pec Stretch at 90 Degrees Abduction  - 1-2 x daily - 7 x weekly - 3 reps - 30 sec hold - Seated Scapular Retraction with External Rotation  - 1-2 x daily - 7 x weekly - 2 sets - 10 reps - 3-5 sec hold - Hooklying Single Leg Bent Knee Fallouts with Resistance  - 1 x daily - 3 x weekly - 2 sets - 10 reps - 3 sec hold - Supine Shoulder Horizontal Abduction with Resistance  - 1 x daily - 3 x weekly - 2 sets - 10 reps - 3 sec hold - Standing Shoulder Row with Anchored Resistance  - 1 x daily - 3 x weekly - 2 sets - 10 reps - 5 sec hold - Scapular Retraction with Resistance Advanced  - 1 x daily - 3 x weekly - 2 sets - 10 reps - 5 sec hold - Standing Shoulder Horizontal Abduction with Resistance  - 1 x daily - 3 x weekly - 2 sets - 10 reps - 3 sec hold - Shoulder External Rotation and Scapular Retraction with Resistance  - 1 x daily - 3 x weekly - 2 sets - 10 reps - 3-5 sec hold - Standing Anti-Rotation Press with Anchored Resistance  - 1 x daily - 3 x weekly - 2 sets - 10 reps - 3 sec hold - Standing Hip Flexion with Anchored Resistance and Chair Support  - 1 x daily - 3 x weekly - 2 sets - 10 reps - 3 sec hold - Standing Hip Adduction with Resistance  - 1 x daily - 3 x weekly - 2 sets - 10 reps - 3 sec hold - Standing Hip Extension with Resistance  -  1 x daily - 3 x weekly - 2 sets - 10 reps - 3 sec hold - Standing Hip Abduction with  Resistance  - 1 x daily - 3 x weekly - 2 sets - 10 reps - 3 sec hold - Seated Hamstring Curl with Anchored Resistance  - 1 x daily - 3 x weekly - 2 sets - 10 reps - 3-5 sec hold - Sitting Knee Extension with Resistance  - 1 x daily - 3 x weekly - 2 sets - 10 reps - 3-5 sec hold - Reverse Chop with Resistance  - 1 x daily - 3 x weekly - 2 sets - 10 reps - Standing Trunk Rotation with Resistance  - 1 x daily - 3 x weekly - 2 sets - 10 reps - Sidelying Thoracic Rotation with Open Book  - 1 x daily - 3 x weekly - 2 sets - 10 reps - Quadruped With Rotation: Thread The Needle  - 1 x daily - 3 x weekly - 2 sets - 10 reps - 3 sec hold - Quadruped Hip Abduction and External Rotation  - 1 x daily - 3 x weekly - 2 sets - 10 reps - 3 sec hold - Prone Shoulder Extension on Swiss Ball  - 1 x daily - 3 x weekly - 2 sets - 10 reps - 3 sec hold - Prone Middle Trapezius Strengthening on Swiss Ball  - 1 x daily - 3 x weekly - 2 sets - 10 reps - 3 sec hold - Anti-Rotation Sidestepping with Resistance  - 1 x daily - 3 x weekly - 2 sets - 10 reps - 3 sec hold - Clamshell with Resistance  - 1 x daily - 3 x weekly - 2 sets - 10 reps - 3-5 sec hold - Bridge with Hip Abduction and Resistance  - 1 x daily - 3 x weekly - 2 sets - 10 reps - 5 sec hold - Standing Hip Hinge with Dowel  - 1 x daily - 3 x weekly - 2 sets - 10 reps - 3 sec hold   ASSESSMENT:  CLINICAL IMPRESSION: Maijor reports that he went to [employer] with his wife and she recorded him hitting a golf ball and he noticed how much he tends to hunch his back/lean fwd, pt thinks his current gold clubs may be too short. Today's session went well w/ Kayla, he was able to perform all activities w/ only reporting some stiffness with full rotation of the chops and lifts to simulate his golf swing. Pt was introduced to lunges on land after he reported he last tried them in the pool. The lunges challenged his balance and postural control, especially w/ his L foot fwd. He  had a small LOB but, was able to react and self correct his posture with minimal cueing. Breaker will benefit from skilled PT to address deficits in his ROM and strength to improve his mobility and activity tolerance w/ dec'd pain interference and decreased risk of falls.     EVAL: Gaberial Cada is a 68 y.o. male who was referred to physical therapy for evaluation and treatment for R sided LBP 2 lumbar DDD.  Patient also c/o intermittent L UE radiculopathy (vibration sensation).  Patient reports onset of R sided LBP pain beginning ~2 months ago w/o know MOI.  Triggers for LBP and R UE radiculopathy are unpredictable.  Patient has deficits in cervical and lumbar ROM, proximal B LE flexibility, L>R UE/scapular strength, B  LE strength, core/postural weakness with abnormal posture, and abnormal muscle tension which are interfering with ADLs and are impacting quality of life.  On Modified Oswestry patient scored 18/50 demonstrating 36% or moderate disability.  On QuickDASH patient scored 29.5/100 demonstrating 29.5% disability.  He also notes impaired balance which will be formally assessed next visit.  Lamaj will benefit from skilled PT to address above deficits to improve mobility and activity tolerance with decreased pain interference.     OBJECTIVE IMPAIRMENTS: decreased activity tolerance, decreased balance, decreased knowledge of condition, decreased mobility, difficulty walking, decreased ROM, decreased strength, increased fascial restrictions, impaired perceived functional ability, increased muscle spasms, impaired flexibility, impaired sensation, impaired UE functional use, improper body mechanics, postural dysfunction, and pain.   ACTIVITY LIMITATIONS: carrying, lifting, bending, sitting, standing, squatting, sleeping, stairs, transfers, bed mobility, reach over head, locomotion level, and caring for others  PARTICIPATION LIMITATIONS: meal prep, cleaning, laundry, community activity, and yard  work  PERSONAL FACTORS: Age, Fitness, Past/current experiences, Time since onset of injury/illness/exacerbation, and 3+ comorbidities: DM-II, obesity, elevated BP, GERD, B patellofemoral pain syndrome, B hip pain, h/o R RTC tendinitis, chronic B LBP w/o sciatica, scoliosis, diabetic peripheral neuropathy are also affecting patient's functional outcome.   REHAB POTENTIAL: Good  CLINICAL DECISION MAKING: Evolving/moderate complexity  EVALUATION COMPLEXITY: Moderate   GOALS: Goals reviewed with patient? Yes  SHORT TERM GOALS: Target date: 09/07/2023  Patient will be independent with initial HEP to improve outcomes and carryover.  Baseline:  08/08/23 - reviewed today Goal status: MET - 08/17/23  2.  Patient will report 25% improvement in low back pain to improve QOL. Baseline: 0/10 on eval, up to at least 7/10  08/17/23 - Twinges remain unpredictable but very short-lived Goal status: MET - 08/31/23 - pt reports overall reduction in pain frequency and intensity by 50%   3.  Patient will report 25% reduction in frequency and intensity of L UE radiculopathy to improve QOL. Baseline: 3-5/10 on eval, up to 8-9/10 08/17/23 - still come and goes  Goal status: IN PROGRESS - 09/09/23 - buzzing essentially unchanged - still come and goes   LONG TERM GOALS: Target date: 10/12/2023  Patient will be independent with ongoing/advanced HEP for self-management at home.  Baseline:  Goal status: IN PROGRESS - 08/31/23 - met for current HEP, anticipate further updates  2.  Patient will report 50-75% improvement in low back pain to improve QOL.  Baseline: 0/10 on eval, up to at least 7/10 Goal status: IN PROGRESS - 08/31/23 - pt reports overall reduction in pain frequency and intensity by 50%   3.  Patient will report 50-75% reduction in frequency and intensity of L UE radiculopathy to improve QOL. Baseline: 3-5/10 on eval, up to 8-9/10 Goal status: IN PROGRESS - 08/31/23 - buzzing essentially unchanged -  still come and goes   4.  Patient to demonstrate ability to achieve and maintain good spinal alignment/posturing and body mechanics needed for daily activities. Baseline: rounded shoulders, forward head, decreased lumbar lordosis, increased thoracic kyphosis, and flexed trunk posture Goal status: IN PROGRESS - 09/09/23 - improving postural awareness but continued intermittent cues necessary  5.  Patient will demonstrate functional pain free cervical and lumbar ROM to perform ADLs.   Baseline: Refer to above cervical and lumbar ROM table Goal status: IN PROGRESS - 09/09/23 - improving cervical and lumbar ROM with remaining restriction more due to tightness than pain  6.  Patient will demonstrate improved scapular, B shoulder and B LE strength to >/=  4+/5 for improved stability and ease of mobility. Baseline: Refer to above UE & LE MMT tables Goal status: IN PROGRESS - 09/09/23 - met except B lower trap for UE and Hip ABD/ADD and ER for LE  7. Patient will report </= 24% on Modified Oswestry (MCID = 12%) to demonstrate improved functional ability with decreased pain interference. Baseline: 18 / 50 = 36.0 % Goal status: IN PROGRESS - 09/09/23 - 20 / 50 = 40.0 %  8.  Patient will report </= 16% on QuickDASH to demonstrate improved functional ability with decreased L UE radiculopathy interference. Baseline: 29.5 / 100 = 29.5 % Goal status: IN PROGRESS - 09/07/23 - 27.3/100   9.  Patient will improve FGA score by at least 4 points or to >/= 28/30 (baseline as of prior D/C from PT) to improve gait stability and reduce risk for falls.  Baseline: 22/30 - 08/10/23 Goal status: IN PROGRESS - 09/09/23 - 24/30   PLAN:  PT FREQUENCY: 2x/week  PT DURATION: 8-10 weeks  PLANNED INTERVENTIONS: 02835- PT Re-evaluation, 97750- Physical Performance Testing, 97110-Therapeutic exercises, 97530- Therapeutic activity, 97112- Neuromuscular re-education, 97535- Self Care, 02859- Manual therapy, [phone]- Gait training,  [phone]- Aquatic Therapy, [phone]- Electrical stimulation (unattended), [phone]- Ultrasound, [id number]- Traction (mechanical), [id number]- Ionotophoresis 4mg /ml Dexamethasone , [id number] (1-2 muscles), [id number] (3+ muscles)- Dry Needling, Patient/Family education, Balance training, Stair training, Taping, Joint mobilization, Spinal mobilization, Cryotherapy, and Moist heat  PLAN FOR NEXT SESSION: Continue to progress core and postural flexibility and strengthening; MT +/- TPDN to address abnormal muscle tension; upcoming session with Garnette focusing on more golf specific mobility/exercises; continue adding rotational components to exercises (golf specific)    Arthea Nobel, Student-PT 09/21/2023, 9:22 AM

## 2023-09-23 ENCOUNTER — Ambulatory Visit

## 2023-09-23 DIAGNOSIS — M5459 Other low back pain: Secondary | ICD-10-CM

## 2023-09-23 DIAGNOSIS — M5412 Radiculopathy, cervical region: Secondary | ICD-10-CM

## 2023-09-23 DIAGNOSIS — M6281 Muscle weakness (generalized): Secondary | ICD-10-CM

## 2023-09-23 NOTE — Therapy (Signed)
 OUTPATIENT PHYSICAL THERAPY TREATMENT     Patient Name: Noah Lane MRN: [id number] DOB:02/05/1956, 68 y.o., male Today's Date: 09/23/2023  END OF SESSION:  PT End of Session - 09/23/23 0850     Visit Number 14    Date for PT Re-Evaluation 10/12/23    Authorization Type Medicare & BCBS    Progress Note Due on Visit 20    PT Start Time 0800    PT Stop Time 0845    PT Time Calculation (min) 45 min    Activity Tolerance Patient tolerated treatment well    Behavior During Therapy WFL for tasks assessed/performed                Past Medical History:  Diagnosis Date   Broken toe    Diabetes mellitus type 2 in obese    Mixed hyperlipidemia    Past Surgical History:  Procedure Laterality Date   COLON RESECTION N/A 04/27/2022   Procedure: LAPAROSCOPIC HAND ASSISTED RIGHT HEMI COLECTOMY, POSSIBLE OPEN;  Surgeon: Dasie Leonor CROME, MD;  Location: [facility];  Service: General;  Laterality: N/A;   COLONOSCOPY     HERNIA REPAIR  2012   Patient Active Problem List   Diagnosis Date Noted   Elevated blood pressure reading 05/04/2022   Colon adenocarcinoma (HCC) 05/04/2022   Gastroesophageal reflux disease 04/29/2022   Ileus, postoperative (HCC) 04/29/2022   Hypokalemia 04/28/2022   Hyperlipidemia 04/28/2022   Colonic obstruction (HCC) 04/25/2022   Hyponatremia 04/25/2022   OSA on CPAP 12/03/2021   Obesity (BMI 30.0-34.9) 12/03/2021   Mixed hyperlipidemia 10/20/2021   Strain of lumbar region 10/16/2021   Strain of calf muscle 07/22/2021   Patellofemoral pain syndrome of both knees 02/24/2021   Rotator cuff tendinitis, right 12/28/2019   Hip pain, bilateral 05/28/2019   Chronic bilateral low back pain without sciatica 05/28/2019   Type 2 diabetes mellitus with obesity (HCC) 05/28/2019   Erectile dysfunction 05/28/2019   Pain and swelling of left knee 03/12/2019   Lateral epicondylitis, right elbow 03/15/2017   Diverticulosis of large intestine without hemorrhage  02/25/2014    PCP: Frann Mabel Mt, DO   REFERRING PROVIDER: Arvell Evalene SAUNDERS, DO   REFERRING DIAG: [phone] (ICD-10-CM) - Degeneration of intervertebral disc of lumbar region, unspecified whether pain present   THERAPY DIAG:  Other low back pain  Radiculopathy, cervical region  Muscle weakness (generalized)  RATIONALE FOR EVALUATION AND TREATMENT: Rehabilitation  ONSET DATE: early April 2025  NEXT MD VISIT: None scheduled with referring provider   SUBJECTIVE:  SUBJECTIVE STATEMENT: Pt reports he is doing good  EVAL:  Pt reports he started getting LBP, mostly on R side, ~2 months ago w/o known MOI.  Pain first noted while rolling over in bed.  Pain comes in strikes w/o predictable trigger.  Pain is momentary lasting a few minutes when present.  He also reports recent onset of L UE numbness and tingling that he says Dr. Frann recommended he mention to PT.  UE radiculopathy also unpredictable in trigger and varies in duration.  He notes constant stiffness in neck, but denies pain.  He also reports balance issues, esp initially on standing, which he attributes to a weak core and his neuropathy.  PAIN: Are you having pain? No and Yes: NPRS scale: 0/10   Pain location: R low back  Pain description: exercise soreness  Aggravating factors: unpredictable  Relieving factors: remain still until pain resolves   Are you having pain? Yes: NPRS scale: comes and goes at all types of intensities  Pain location: L UE down to radial side of hand, occasional in thumb  Pain description: vibration (reminds him of a TENS unit), stiffness in neck  Aggravating factors: unpredictable  Relieving factors: heating pads, stretches   PERTINENT HISTORY:  DM-II, obesity, elevated BP, GERD, B  patellofemoral pain syndrome, B hip pain, h/o R RTC tendinitis, chronic B LBP w/o sciatica, scoliosis, diabetic peripheral neuropathy  PRECAUTIONS: None  RED FLAGS: None  WEIGHT BEARING RESTRICTIONS: No  FALLS:  Has patient fallen in last 6 months? 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 I would like the shoulder thing to go away. Better balance.   OBJECTIVE: (objective measures completed at initial evaluation unless otherwise dated)  DIAGNOSTIC FINDINGS:  10/16/21 - DG lumbar spine IMPRESSION: No acute fracture or subluxation of the lumbar spine.  PATIENT SURVEYS:  Modified Oswestry 18 / 50 = 36.0 %, moderate disability   Quick Dash 29.5 / 100 = 29.5 %  COGNITION:  Overall cognitive status: History of cognitive impairments - at baseline (mostly STM memory recall)    SENSATION: WFL B peripheral neuropathy in feet  POSTURE:  rounded shoulders, forward head, decreased lumbar lordosis, increased thoracic kyphosis, and flexed trunk   PALPATION: Increased muscle tension in L>R UT and LS as well as R>L lumbar paraspinals and glutes/piriformis  CERVICAL ROM:   Active ROM Eval 09/09/23  Flexion 49 52  Extension 42 48  Right lateral flexion 24 32  Left lateral flexion 16 24  Right rotation 51 61  Left rotation 52 58   (Blank rows = not tested)  UPPER EXTREMITY MMT:  MMT Right eval Left eval R 09/09/23 L 09/09/23 R 09/23/23 L 09/23/23  Shoulder flexion 4+ 4- 5 4+ 5 5   Shoulder extension 4+ 4+ 5 5 5 5   Shoulder abduction 4+ 4 5 5 5 5   Shoulder adduction        Shoulder internal rotation 4+ 4+ 5 5 5 5   Shoulder external rotation 4 4  4+ 4+ 5 5  Middle trapezius 4- 4- 4+ 4+ 4+ 4+  Lower trapezius 3+ 3+ 4 4 4 4   Grip strength         (Blank rows = not tested)  LUMBAR ROM:   Active  Eval 09/09/23  Flexion Hands to mid shins Fingertips to toes  Extension 25% limited WFL  Right lateral flexion Hand to femoral condyle - tight Hand to femoral condyle - heavy tightness  Left lateral flexion Hand to lateral knee - twinge in R low back Hand to femoral condyle - heavy tightness  Right rotation [facility] WFL  Left rotation [facility] WFL  (Blank rows = not tested)  MUSCLE LENGTH: Hamstrings: mod/severe tight L>R ITB: mod/severe tight B Piriformis: mild/mod tight R>L Hip flexors: mod tight B Quads: mod tight R>L  LOWER EXTREMITY ROM:    Grossly WFL  LOWER EXTREMITY MMT:    MMT Right eval Left eval R 09/09/23 L 09/09/23  Hip flexion 4 4 4+ 4+  Hip extension 4- 4- 4+ 4+  Hip abduction 4- 4- 4 4+  Hip adduction 4- 3+ 4+ 4  Hip internal rotation 5 4+ 5 (limited ROM) 5 (limited ROM)  Hip external rotation 4- 3+ 4- 4 (limited ROM)  Knee flexion 5 4+ 5 5  Knee extension 5 5 5 5   Ankle dorsiflexion 4+ 4+ 5 5  Ankle plantarflexion      Ankle inversion      Ankle eversion       (Blank rows = not tested)  LUMBAR SPECIAL TESTS:  Straight leg raise test: Negative and Slump test: Negative  FUNCTIONAL TESTS: (08/10/23) 5 times sit to stand: 15.75 sec Functional gait assessment: 22/30; indicated medium risk for falls Berg balance scale: 50/56; indicates moderate (>50%) risk for falls    09/09/23: FGA = 24/30, indicative of medium fall risk   Scores as of D/C from PT in July 2024: Berg: 54/56 FGA: 28/30   TODAY'S TREATMENT:  09/23/23 THERAPEUTIC EXERCISE: To improve strength and endurance.  Demonstration, verbal and tactile cues throughout for technique.   Elliptical- L1x92min  NEUROMUSCULAR RE-EDUCATION: To improve balance, coordination, kinesthesia, posture, and proprioception. Fwd lunges 2 x 10 R/L Lateral lunges 2 x 10 R/L Pallof press GTB x 20 each side in semi tandem Shld ext with rotation GTB x 20 one arm at a time Standing oscillations in shld ext GTB fwd x 10 B; side to side x 10 B  09/21/23 THERAPEUTIC EXERCISE: To improve strength and endurance.  Demonstration, verbal and tactile cues throughout for technique.  Elliptical- L1x58min  Open Book Thoacolumbar Stretch w/ top knee bent x 10 B- 3-5 Sustained bridge w/ GTB + hip ABD 2x10  Supine Hip Add w/ ball squeeze 2x10 THERAPEUTIC ACTIVITIES: To improve functional performance.  Demonstration, verbal and tactile cues throughout for technique. Standing Chop w/ GTB 2x10 B Standing Lift w/ full trunk rotation w/ GTB 2x10  NEUROMUSCULAR RE-EDUCATION: To improve balance, coordination, kinesthesia, posture, and proprioception. Stationary Lunges x10 B- pt has small LOB w/ L foot fwd    09/14/23 NEUROMUSCULAR RE-EDUCATION: To improve balance, coordination, kinesthesia, posture, and proprioception. Functional squat + OH raise x 10, with 3# in B hands x 10 Multifidi walkout with GTB resistance x 10 B - cues to keep scapular and core muscles engaged Standing hip hinge with pole along spine 2 x 10 Sustained bridge + GTB clam/hip ABD 2 x 10  THERAPEUTIC ACTIVITIES: To improve functional performance.  Demonstration, verbal and tactile  cues throughout for technique. Standing lift and chop with GTB 2 x 10 B - 1st sets with focus on UE diagonal with stable core, 2nd sets adding full trunk rotation (working toward core activation with mechanics of golf swing)  THERAPEUTIC EXERCISE: To improve strength and endurance.  Demonstration, verbal and tactile cues throughout for technique.  S/L GTB clam 2 x 10   09/12/23 THERAPEUTIC EXERCISE: To improve strength and endurance.   Elliptical - L1.0 x 5  min  NEUROMUSCULAR RE-EDUCATION: To improve balance, coordination, kinesthesia, posture, proprioception, and reduce fall risk.  Tandem stance 2 x 30 with each foot forward - intermittent UE support on counter  Tandem gait: fwd 4 x 10 ft, backward 2 x 10 ft - intermittent UE support on counter  Staggered stance progressing to tandem stance + GTB pallof press x 10 bil with each foot fwd Corner balance progression with tandem stance on firm surface with arms at sides            Eyes open: static stance x 30 sec horiz head turns x 5 vertical head nods x 5 trunk rotation x 5 Eyes closed: static stance x 15-20 sec   09/09/23 THERAPEUTIC EXERCISE: To improve strength and endurance.  Demonstration, verbal and tactile cues throughout for technique.  NuStep - L6 x 8 min  PHYSICAL PERFORMANCE TEST or MEASUREMENT: Modified Oswestry: 20 / 50 = 40.0 % Functional Gait  Assessment  Gait Level Surface Walks 20 ft in less than 5.5 sec, no assistive devices, good speed, no evidence for imbalance, normal gait pattern, deviates no more than 6 in outside of the 12 in walkway width.   Change in Gait Speed Able to smoothly change walking speed without loss of balance or gait deviation. Deviate no more than 6 in outside of the 12 in walkway width.   Gait with Horizontal Head Turns Performs head turns smoothly with no change in gait. Deviates no more than 6 in outside 12 in walkway width   Gait with Vertical Head Turns Performs head turns with no change in gait. Deviates no more than 6 in outside 12 in walkway width.   Gait and Pivot Turn Pivot turns safely within 3 sec and stops quickly with no loss of balance.   Step Over Obstacle Is able to step over one shoe box (4.5 in total height) without changing gait speed. No evidence of imbalance.   Gait with Narrow Base of Support Ambulates 4-7 steps.   Gait with Eyes Closed Walks 20 ft, uses assistive device, slower speed, mild gait deviations, deviates 6-10 in  outside 12 in walkway width. Ambulates 20 ft in less than 9 sec but greater than 7 sec.   Ambulating Backwards Walks 20 ft, uses assistive device, slower speed, mild gait deviations, deviates 6-10 in outside 12 in walkway width.   Steps Alternating feet, must use rail.   Total Score 24   FGA comment: 19-24 = medium risk fall      THERAPEUTIC ACTIVITIES: To improve functional performance.  Demonstration, verbal and tactile cues throughout for technique. Cervical & lumbar ROM assessment UE/LE MMT Goal assessment  SELF CARE:  Review of progress with PT, status of goals, and plans for ongoing PT POC requesting patient input and feedback to ensure PT is addressing all of his concerns.   09/07/23 NEUROMUSCULAR RE-EDUCATION: To improve coordination, kinesthesia and proprioception.  Quadruped cat/cow x 10 Quadruped fire hydrants x 10 B- using hand grips Bird dog x 10 B Ab  sets orange pball 10x5 LE pedals + TRA 2x15  THERAPEUTIC EXERCISE: To improve strength, endurance, ROM, and flexibility.  Seated KTOS piriformis stretch x 30 B  THERAPEUTIC ACTIVITIES: To improve functional performance.  Standing trunk rotation green TB x 15 B Standing pallof press GTB x 20 in semi tandem Standing chops GTB x 20 in semi tandem   08/31/2023 THERAPEUTIC EXERCISE: To improve strength and endurance.  Demonstration, verbal and tactile cues throughout for technique.  NuStep - L6 x 8 min  NEUROMUSCULAR RE-EDUCATION: To improve coordination, kinesthesia, posture, and proprioception. Quadruped thread and reach 2 x 10 bil Quadruped hip ABD & ER 2 x 10 bil, yoga block balanced on low back to promote neutral spine Prone from knees over green Pball - cervical retraction, thoracic extension + I's & T's x 10 each, cues for neutral cervical spine alignment avoiding flexion or extension   PATIENT EDUCATION:  Education details: progress with PT and ongoing PT POC  Person educated: Patient Education method:  Explanation Education comprehension: verbalized understanding  HOME EXERCISE PROGRAM: *Pt has MedBridgeGO access but also prefers printed handouts.  Access Code: [email or web address] URL: [email or web address] Date: 09/14/2023 Prepared by: Elijah Hidden  Exercises - Supine Hamstring Stretch with Strap  - 1-2 x daily - 7 x weekly - 3 reps - 30 sec hold - Supine Iliotibial Band Stretch with Strap  - 1-2 x daily - 7 x weekly - 3 reps - 30 sec hold - Prone Hip Flexor & Quad Stretch with Strap  - 1-2 x daily - 7 x weekly - 3 reps - 30 sec hold - Hip Flexor Stretch with Strap on Table  - 1-2 x daily - 7 x weekly - 3 reps - 30 sec hold - Supine Gluteus Stretch  - 1-2 x daily - 7 x weekly - 3 reps - 30 sec hold - Supine Piriformis Stretch with Foot on Ground  - 1-2 x daily - 7 x weekly - 3 reps - 30 sec hold - Supine Lower Trunk Rotation  - 1-2 x daily - 7 x weekly - 2 sets - 5 reps - 10 sec hold - Supine Hip Adduction Isometric with Ball  - 1-2 x daily - 7 x weekly - 2 sets - 10 reps - 5 sec hold - Doorway Pec Stretch at 90 Degrees Abduction  - 1-2 x daily - 7 x weekly - 3 reps - 30 sec hold - Seated Scapular Retraction with External Rotation  - 1-2 x daily - 7 x weekly - 2 sets - 10 reps - 3-5 sec hold - Hooklying Single Leg Bent Knee Fallouts with Resistance  - 1 x daily - 3 x weekly - 2 sets - 10 reps - 3 sec hold - Supine Shoulder Horizontal Abduction with Resistance  - 1 x daily - 3 x weekly - 2 sets - 10 reps - 3 sec hold - Standing Shoulder Row with Anchored Resistance  - 1 x daily - 3 x weekly - 2 sets - 10 reps - 5 sec hold - Scapular Retraction with Resistance Advanced  - 1 x daily - 3 x weekly - 2 sets - 10 reps - 5 sec hold - Standing Shoulder Horizontal Abduction with Resistance  - 1 x daily - 3 x weekly - 2 sets - 10 reps - 3 sec hold - Shoulder External Rotation and Scapular Retraction with Resistance  - 1 x daily - 3 x weekly - 2 sets - 10 reps - 3-5 sec  hold - Standing  Anti-Rotation Press with Anchored Resistance  - 1 x daily - 3 x weekly - 2 sets - 10 reps - 3 sec hold - Standing Hip Flexion with Anchored Resistance and Chair Support  - 1 x daily - 3 x weekly - 2 sets - 10 reps - 3 sec hold - Standing Hip Adduction with Resistance  - 1 x daily - 3 x weekly - 2 sets - 10 reps - 3 sec hold - Standing Hip Extension with Resistance  - 1 x daily - 3 x weekly - 2 sets - 10 reps - 3 sec hold - Standing Hip Abduction with Resistance  - 1 x daily - 3 x weekly - 2 sets - 10 reps - 3 sec hold - Seated Hamstring Curl with Anchored Resistance  - 1 x daily - 3 x weekly - 2 sets - 10 reps - 3-5 sec hold - Sitting Knee Extension with Resistance  - 1 x daily - 3 x weekly - 2 sets - 10 reps - 3-5 sec hold - Reverse Chop with Resistance  - 1 x daily - 3 x weekly - 2 sets - 10 reps - Standing Trunk Rotation with Resistance  - 1 x daily - 3 x weekly - 2 sets - 10 reps - Sidelying Thoracic Rotation with Open Book  - 1 x daily - 3 x weekly - 2 sets - 10 reps - Quadruped With Rotation: Thread The Needle  - 1 x daily - 3 x weekly - 2 sets - 10 reps - 3 sec hold - Quadruped Hip Abduction and External Rotation  - 1 x daily - 3 x weekly - 2 sets - 10 reps - 3 sec hold - Prone Shoulder Extension on Swiss Ball  - 1 x daily - 3 x weekly - 2 sets - 10 reps - 3 sec hold - Prone Middle Trapezius Strengthening on Swiss Ball  - 1 x daily - 3 x weekly - 2 sets - 10 reps - 3 sec hold - Anti-Rotation Sidestepping with Resistance  - 1 x daily - 3 x weekly - 2 sets - 10 reps - 3 sec hold - Clamshell with Resistance  - 1 x daily - 3 x weekly - 2 sets - 10 reps - 3-5 sec hold - Bridge with Hip Abduction and Resistance  - 1 x daily - 3 x weekly - 2 sets - 10 reps - 5 sec hold - Standing Hip Hinge with Dowel  - 1 x daily - 3 x weekly - 2 sets - 10 reps - 3 sec hold   ASSESSMENT:  CLINICAL IMPRESSION: Noah Lane reports that he went to [employer] with his wife and she recorded him hitting a golf ball and he  noticed how much he tends to hunch his back/lean fwd, pt thinks his current gold clubs may be too short. Today's session went well w/ Kayla, he was able to perform all activities w/ only reporting some stiffness with full rotation of the chops and lifts to simulate his golf swing. Pt was introduced to lunges on land after he reported he last tried them in the pool. The lunges challenged his balance and postural control, especially w/ his L foot fwd. He had a small LOB but, was able to react and self correct his posture with minimal cueing. Noah Lane will benefit from skilled PT to address deficits in his ROM and strength to improve his mobility and activity tolerance w/ dec'd  pain interference and decreased risk of falls.     EVAL: Noah Lane is a 68 y.o. male who was referred to physical therapy for evaluation and treatment for R sided LBP 2 lumbar DDD.  Patient also c/o intermittent L UE radiculopathy (vibration sensation).  Patient reports onset of R sided LBP pain beginning ~2 months ago w/o know MOI.  Triggers for LBP and R UE radiculopathy are unpredictable.  Patient has deficits in cervical and lumbar ROM, proximal B LE flexibility, L>R UE/scapular strength, B LE strength, core/postural weakness with abnormal posture, and abnormal muscle tension which are interfering with ADLs and are impacting quality of life.  On Modified Oswestry patient scored 18/50 demonstrating 36% or moderate disability.  On QuickDASH patient scored 29.5/100 demonstrating 29.5% disability.  He also notes impaired balance which will be formally assessed next visit.  Noah Lane will benefit from skilled PT to address above deficits to improve mobility and activity tolerance with decreased pain interference.     OBJECTIVE IMPAIRMENTS: decreased activity tolerance, decreased balance, decreased knowledge of condition, decreased mobility, difficulty walking, decreased ROM, decreased strength, increased fascial restrictions, impaired  perceived functional ability, increased muscle spasms, impaired flexibility, impaired sensation, impaired UE functional use, improper body mechanics, postural dysfunction, and pain.   ACTIVITY LIMITATIONS: carrying, lifting, bending, sitting, standing, squatting, sleeping, stairs, transfers, bed mobility, reach over head, locomotion level, and caring for others  PARTICIPATION LIMITATIONS: meal prep, cleaning, laundry, community activity, and yard work  PERSONAL FACTORS: Age, Fitness, Past/current experiences, Time since onset of injury/illness/exacerbation, and 3+ comorbidities: DM-II, obesity, elevated BP, GERD, B patellofemoral pain syndrome, B hip pain, h/o R RTC tendinitis, chronic B LBP w/o sciatica, scoliosis, diabetic peripheral neuropathy are also affecting patient's functional outcome.   REHAB POTENTIAL: Good  CLINICAL DECISION MAKING: Evolving/moderate complexity  EVALUATION COMPLEXITY: Moderate   GOALS: Goals reviewed with patient? Yes  SHORT TERM GOALS: Target date: 09/07/2023  Patient will be independent with initial HEP to improve outcomes and carryover.  Baseline:  08/08/23 - reviewed today Goal status: MET - 08/17/23  2.  Patient will report 25% improvement in low back pain to improve QOL. Baseline: 0/10 on eval, up to at least 7/10  08/17/23 - Twinges remain unpredictable but very short-lived Goal status: MET - 08/31/23 - pt reports overall reduction in pain frequency and intensity by 50%   3.  Patient will report 25% reduction in frequency and intensity of L UE radiculopathy to improve QOL. Baseline: 3-5/10 on eval, up to 8-9/10 08/17/23 - still come and goes  Goal status: IN PROGRESS - 09/23/23 - buzzing essentially unchanged - still come and goes   LONG TERM GOALS: Target date: 10/12/2023  Patient will be independent with ongoing/advanced HEP for self-management at home.  Baseline:  Goal status: IN PROGRESS - 08/31/23 - met for current HEP, anticipate further  updates  2.  Patient will report 50-75% improvement in low back pain to improve QOL.  Baseline: 0/10 on eval, up to at least 7/10 Goal status: IN PROGRESS - 08/31/23 - pt reports overall reduction in pain frequency and intensity by 50%   3.  Patient will report 50-75% reduction in frequency and intensity of L UE radiculopathy to improve QOL. Baseline: 3-5/10 on eval, up to 8-9/10 Goal status: IN PROGRESS - 08/31/23 - buzzing essentially unchanged - still come and goes   4.  Patient to demonstrate ability to achieve and maintain good spinal alignment/posturing and body mechanics needed for daily activities. Baseline: rounded shoulders, forward  head, decreased lumbar lordosis, increased thoracic kyphosis, and flexed trunk posture Goal status: IN PROGRESS - 09/09/23 - improving postural awareness but continued intermittent cues necessary  5.  Patient will demonstrate functional pain free cervical and lumbar ROM to perform ADLs.   Baseline: Refer to above cervical and lumbar ROM table Goal status: IN PROGRESS - 09/09/23 - improving cervical and lumbar ROM with remaining restriction more due to tightness than pain  6.  Patient will demonstrate improved scapular, B shoulder and B LE strength to >/= 4+/5 for improved stability and ease of mobility. Baseline: Refer to above UE & LE MMT tables Goal status: IN PROGRESS - 09/09/23 - met except B lower trap for UE and Hip ABD/ADD and ER for LE  7. Patient will report </= 24% on Modified Oswestry (MCID = 12%) to demonstrate improved functional ability with decreased pain interference. Baseline: 18 / 50 = 36.0 % Goal status: IN PROGRESS - 09/09/23 - 20 / 50 = 40.0 %  8.  Patient will report </= 16% on QuickDASH to demonstrate improved functional ability with decreased L UE radiculopathy interference. Baseline: 29.5 / 100 = 29.5 % Goal status: IN PROGRESS - 09/07/23 - 27.3/100   9.  Patient will improve FGA score by at least 4 points or to >/= 28/30 (baseline  as of prior D/C from PT) to improve gait stability and reduce risk for falls.  Baseline: 22/30 - 08/10/23 Goal status: IN PROGRESS - 09/09/23 - 24/30   PLAN:  PT FREQUENCY: 2x/week  PT DURATION: 8-10 weeks  PLANNED INTERVENTIONS: 02835- PT Re-evaluation, 97750- Physical Performance Testing, 97110-Therapeutic exercises, 97530- Therapeutic activity, 97112- Neuromuscular re-education, 97535- Self Care, 02859- Manual therapy, [phone]- Gait training, [phone]- Aquatic Therapy, [phone]- Electrical stimulation (unattended), [phone]- Ultrasound, [id number]- Traction (mechanical), [id number]- Ionotophoresis 4mg /ml Dexamethasone , [id number] (1-2 muscles), [id number] (3+ muscles)- Dry Needling, Patient/Family education, Balance training, Stair training, Taping, Joint mobilization, Spinal mobilization, Cryotherapy, and Moist heat  PLAN FOR NEXT SESSION: Continue to progress core and postural flexibility and strengthening; MT +/- TPDN to address abnormal muscle tension; upcoming session with Garnette focusing on more golf specific mobility/exercises; continue adding rotational components to exercises (golf specific)    Sol LITTIE Gaskins, PTA 09/23/2023, 8:50 AM

## 2023-09-27 ENCOUNTER — Ambulatory Visit: Admitting: Rehabilitation

## 2023-09-27 ENCOUNTER — Telehealth: Payer: Self-pay | Admitting: Pharmacist

## 2023-09-27 DIAGNOSIS — M5459 Other low back pain: Secondary | ICD-10-CM

## 2023-09-27 DIAGNOSIS — R293 Abnormal posture: Secondary | ICD-10-CM

## 2023-09-27 DIAGNOSIS — M5412 Radiculopathy, cervical region: Secondary | ICD-10-CM

## 2023-09-27 DIAGNOSIS — M6281 Muscle weakness (generalized): Secondary | ICD-10-CM

## 2023-09-27 DIAGNOSIS — R2681 Unsteadiness on feet: Secondary | ICD-10-CM

## 2023-09-27 NOTE — Telephone Encounter (Signed)
 Called [employer] [facility] patient assistance program. We ordered updated Ozempic  dose on 09/05/2023. Per [employer] representative it was processed 09/07/2023 but has not shipped yet. Usually only takes 10 to 14 business days (is past that now) but [facility] is behind in shipping.

## 2023-09-27 NOTE — Therapy (Signed)
 OUTPATIENT PHYSICAL THERAPY TREATMENT     Patient Name: Noah Lane MRN: [id number] DOB:[birth year], 68 y.o., male Today's Date: 09/28/2023  END OF SESSION:  PT End of Session - 09/27/23 0805     Visit Number 15    Date for PT Re-Evaluation 10/12/23    Authorization Type Medicare & BCBS    Progress Note Due on Visit 20    PT Start Time 0803    PT Stop Time 0855    PT Time Calculation (min) 52 min    Activity Tolerance Patient tolerated treatment well    Behavior During Therapy WFL for tasks assessed/performed                Past Medical History:  Diagnosis Date   Broken toe    Diabetes mellitus type 2 in obese    Mixed hyperlipidemia    Past Surgical History:  Procedure Laterality Date   COLON RESECTION N/A 04/27/2022   Procedure: LAPAROSCOPIC HAND ASSISTED RIGHT HEMI COLECTOMY, POSSIBLE OPEN;  Surgeon: Dasie Leonor CROME, MD;  Location: [facility];  Service: General;  Laterality: N/A;   COLONOSCOPY     HERNIA REPAIR  2012   Patient Active Problem List   Diagnosis Date Noted   Elevated blood pressure reading 05/04/2022   Colon adenocarcinoma (HCC) 05/04/2022   Gastroesophageal reflux disease 04/29/2022   Ileus, postoperative (HCC) 04/29/2022   Hypokalemia 04/28/2022   Hyperlipidemia 04/28/2022   Colonic obstruction (HCC) 04/25/2022   Hyponatremia 04/25/2022   OSA on CPAP 12/03/2021   Obesity (BMI 30.0-34.9) 12/03/2021   Mixed hyperlipidemia 10/20/2021   Strain of lumbar region 10/16/2021   Strain of calf muscle 07/22/2021   Patellofemoral pain syndrome of both knees 02/24/2021   Rotator cuff tendinitis, right 12/28/2019   Hip pain, bilateral 05/28/2019   Chronic bilateral low back pain without sciatica 05/28/2019   Type 2 diabetes mellitus with obesity (HCC) 05/28/2019   Erectile dysfunction 05/28/2019   Pain and swelling of left knee 03/12/2019   Lateral epicondylitis, right elbow 03/15/2017   Diverticulosis of large intestine without hemorrhage  02/25/2014    PCP: Frann Mabel Mt, DO   REFERRING PROVIDER: Arvell Evalene SAUNDERS, DO   REFERRING DIAG: [phone] (ICD-10-CM) - Degeneration of intervertebral disc of lumbar region, unspecified whether pain present   THERAPY DIAG:  Other low back pain  Radiculopathy, cervical region  Muscle weakness (generalized)  Abnormal posture  Unsteadiness on feet  RATIONALE FOR EVALUATION AND TREATMENT: Rehabilitation  ONSET DATE: early April 2025  NEXT MD VISIT: None scheduled with referring provider   SUBJECTIVE:  SUBJECTIVE STATEMENT: Patient shows video of himself hitting golf balls at [employer] a month or so ago.  Posture is flat lower back with flexed upper back.  Feels pain after hitting golf balls and feels poor posture and lack of postural strength/stability is contributing to this pain.  EVAL:  Pt reports he started getting LBP, mostly on R side, ~2 months ago w/o known MOI.  Pain first noted while rolling over in bed.  Pain comes in strikes w/o predictable trigger.  Pain is momentary lasting a few minutes when present.  He also reports recent onset of L UE numbness and tingling that he says Dr. Frann recommended he mention to PT.  UE radiculopathy also unpredictable in trigger and varies in duration.  He notes constant stiffness in neck, but denies pain.  He also reports balance issues, esp initially on standing, which he attributes to a weak core and his neuropathy.  PAIN: Are you having pain? No and Yes: NPRS scale: 0/10   Pain location: R low back  Pain description: exercise soreness  Aggravating factors: unpredictable  Relieving factors: remain still until pain resolves   Are you having pain? Yes: NPRS scale: comes and goes at all types of intensities  Pain location:  L UE down to radial side of hand, occasional in thumb  Pain description: vibration (reminds him of a TENS unit), stiffness in neck  Aggravating factors: unpredictable  Relieving factors: heating pads, stretches   PERTINENT HISTORY:  DM-II, obesity, elevated BP, GERD, B patellofemoral pain syndrome, B hip pain, h/o R RTC tendinitis, chronic B LBP w/o sciatica, scoliosis, diabetic peripheral neuropathy  PRECAUTIONS: None  RED FLAGS: None  WEIGHT BEARING RESTRICTIONS: No  FALLS:  Has patient fallen in last 6 months? 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 I would like the shoulder thing to go away. Better balance.   OBJECTIVE: (objective measures completed at initial evaluation unless otherwise dated)  DIAGNOSTIC FINDINGS:  10/16/21 - DG lumbar spine IMPRESSION: No acute fracture or subluxation of the lumbar spine.  PATIENT SURVEYS:  Modified Oswestry 18 / 50 = 36.0 %, moderate disability   Quick Dash 29.5 / 100 = 29.5 %  COGNITION:  Overall cognitive status: History of cognitive impairments - at baseline (mostly STM memory recall)    SENSATION: WFL B peripheral neuropathy in feet  POSTURE:  rounded shoulders, forward head, decreased lumbar lordosis, increased thoracic kyphosis, and flexed trunk   PALPATION: Increased muscle tension in L>R UT and LS as well as R>L lumbar paraspinals and glutes/piriformis  CERVICAL ROM:   Active ROM Eval 09/09/23  Flexion 49 52   Extension 42 48  Right lateral flexion 24 32  Left lateral flexion 16 24  Right rotation 51 61  Left rotation 52 58   (Blank rows = not tested)  UPPER EXTREMITY MMT:  MMT Right eval Left eval R 09/09/23 L 09/09/23 R 09/23/23 L 09/23/23  Shoulder flexion 4+ 4- 5 4+ 5 5  Shoulder extension  4+ 4+ 5 5 5 5   Shoulder abduction 4+ 4 5 5 5 5   Shoulder adduction        Shoulder internal rotation 4+ 4+ 5 5 5 5   Shoulder external rotation 4 4 4+ 4+ 5 5  Middle trapezius 4- 4- 4+ 4+ 4+ 4+  Lower trapezius 3+ 3+ 4 4 4 4   Grip strength         (Blank rows = not tested)  LUMBAR ROM:   Active  Eval 09/09/23  Flexion Hands to mid shins Fingertips to toes  Extension 25% limited WFL  Right lateral flexion Hand to femoral condyle - tight Hand to femoral condyle - heavy tightness  Left lateral flexion Hand to lateral knee - twinge in R low back Hand to femoral condyle - heavy tightness  Right rotation [facility] WFL  Left rotation [facility] WFL  (Blank rows = not tested)  MUSCLE LENGTH: Hamstrings: mod/severe tight L>R ITB: mod/severe tight B Piriformis: mild/mod tight R>L Hip flexors: mod tight B Quads: mod tight R>L  LOWER EXTREMITY ROM:    Grossly WFL  LOWER EXTREMITY MMT:    MMT Right eval Left eval R 09/09/23 L 09/09/23  Hip flexion 4 4 4+ 4+  Hip extension 4- 4- 4+ 4+  Hip abduction 4- 4- 4 4+  Hip adduction 4- 3+ 4+ 4  Hip internal rotation 5 4+ 5 (limited ROM) 5 (limited ROM)  Hip external rotation 4- 3+ 4- 4 (limited ROM)  Knee flexion 5 4+ 5 5  Knee extension 5 5 5 5   Ankle dorsiflexion 4+ 4+ 5 5  Ankle plantarflexion      Ankle inversion      Ankle eversion       (Blank rows = not tested)  LUMBAR SPECIAL TESTS:  Straight leg raise test: Negative and Slump test: Negative  FUNCTIONAL TESTS: (08/10/23) 5 times sit to stand: 15.75 sec Functional gait assessment: 22/30; indicated medium risk for falls Berg balance scale: 50/56; indicates moderate (>50%) risk for falls     09/09/23: FGA = 24/30, indicative of medium fall risk   Scores as of D/C from PT in July 2024: Berg: 54/56 FGA: 28/30   TODAY'S TREATMENT:  09/27/23 THERAPEUTIC EXERCISE: To improve strength and endurance.  Demonstration, verbal and tactile cues throughout for technique.  NuStep L5 x 5' Elliptical- L1 x 2' F/2'B min   THERAPEUTIC ACTIVITIES: To improve functional performance.  Demonstration, verbal and tactile cues throughout for technique. Low row from manually corrected golf stance position Blue TB x 20 BUE  NEUROMUSCULAR RE-EDUCATION: To improve balance and posture. RTB BUE resisted backswing x 2/10 RTB BUE trunk stabilization w/ side lift/slide BUE (shlr ABD RUE, ADD LUE) resisted chop/downswing blue TB x 2/10 BUE MFI walkouts x 5 laps 1/2 kneeling chop Blue TB x 10 BUE Tall kneeling resisted back swing YTB x 2/10 BUE  09/23/23 THERAPEUTIC EXERCISE: To improve strength and endurance.  Demonstration, verbal and tactile cues throughout for technique.  Elliptical- L1x42min  NEUROMUSCULAR RE-EDUCATION: To improve balance, coordination, kinesthesia, posture, and proprioception. Fwd lunges 2 x 10 R/L Lateral lunges 2 x 10 R/L Pallof press GTB x 20 each side in semi tandem Shld ext with rotation GTB x 20 one arm at a time Standing oscillations in shld ext GTB fwd x 10 B; side to side x 10 B  09/21/23 THERAPEUTIC EXERCISE: To improve strength and endurance.  Demonstration, verbal and tactile cues throughout for technique.  Elliptical- L1x56min  Open Book  Thoacolumbar Stretch w/ top knee bent x 10 B- 3-5 Sustained bridge w/ GTB + hip ABD 2x10  Supine Hip Add w/ ball squeeze 2x10 THERAPEUTIC ACTIVITIES: To improve functional performance.  Demonstration, verbal and tactile cues throughout for technique. Standing Chop w/ GTB 2x10 B Standing Lift w/ full trunk rotation w/ GTB 2x10  NEUROMUSCULAR RE-EDUCATION: To improve balance, coordination, kinesthesia, posture, and  proprioception. Stationary Lunges x10 B- pt has small LOB w/ L foot fwd    09/14/23 NEUROMUSCULAR RE-EDUCATION: To improve balance, coordination, kinesthesia, posture, and proprioception. Functional squat + OH raise x 10, with 3# in B hands x 10 Multifidi walkout with GTB resistance x 10 B - cues to keep scapular and core muscles engaged Standing hip hinge with pole along spine 2 x 10 Sustained bridge + GTB clam/hip ABD 2 x 10  THERAPEUTIC ACTIVITIES: To improve functional performance.  Demonstration, verbal and tactile cues throughout for technique. Standing lift and chop with GTB 2 x 10 B - 1st sets with focus on UE diagonal with stable core, 2nd sets adding full trunk rotation (working toward core activation with mechanics of golf swing)  THERAPEUTIC EXERCISE: To improve strength and endurance.  Demonstration, verbal and tactile cues throughout for technique.  S/L GTB clam 2 x 10   09/12/23 THERAPEUTIC EXERCISE: To improve strength and endurance.   Elliptical - L1.0 x 5 min  NEUROMUSCULAR RE-EDUCATION: To improve balance, coordination, kinesthesia, posture, proprioception, and reduce fall risk.  Tandem stance 2 x 30 with each foot forward - intermittent UE support on counter  Tandem gait: fwd 4 x 10 ft, backward 2 x 10 ft - intermittent UE support on counter  Staggered stance progressing to tandem stance + GTB pallof press x 10 bil with each foot fwd Corner balance progression with tandem stance on firm surface with arms at sides            Eyes open: static stance x 30 sec horiz head turns x 5 vertical head nods x 5 trunk rotation x 5 Eyes closed: static stance x 15-20 sec   09/09/23 THERAPEUTIC EXERCISE: To improve strength and endurance.  Demonstration, verbal and tactile cues throughout for technique.  NuStep - L6 x 8 min  PHYSICAL PERFORMANCE TEST or MEASUREMENT: Modified Oswestry: 20 / 50 = 40.0 % Functional Gait  Assessment  Gait Level Surface Walks 20 ft in less  than 5.5 sec, no assistive devices, good speed, no evidence for imbalance, normal gait pattern, deviates no more than 6 in outside of the 12 in walkway width.   Change in Gait Speed Able to smoothly change walking speed without loss of balance or gait deviation. Deviate no more than 6 in outside of the 12 in walkway width.   Gait with Horizontal Head Turns Performs head turns smoothly with no change in gait. Deviates no more than 6 in outside 12 in walkway width   Gait with Vertical Head Turns Performs head turns with no change in gait. Deviates no more than 6 in outside 12 in walkway width.   Gait and Pivot Turn Pivot turns safely within 3 sec and stops quickly with no loss of balance.   Step Over Obstacle Is able to step over one shoe box (4.5 in total height) without changing gait speed. No evidence of imbalance.   Gait with Narrow Base of Support Ambulates 4-7 steps.   Gait with Eyes Closed Walks 20 ft, uses assistive device, slower speed, mild gait deviations, deviates 6-10 in  outside 12 in walkway width. Ambulates 20 ft in less than 9 sec but greater than 7 sec.   Ambulating Backwards Walks 20 ft, uses assistive device, slower speed, mild gait deviations, deviates 6-10 in outside 12 in walkway width.   Steps Alternating feet, must use rail.   Total Score 24   FGA comment: 19-24 = medium risk fall      THERAPEUTIC ACTIVITIES: To improve functional performance.  Demonstration, verbal and tactile cues throughout for technique. Cervical & lumbar ROM assessment UE/LE MMT Goal assessment  SELF CARE:  Review of progress with PT, status of goals, and plans for ongoing PT POC requesting patient input and feedback to ensure PT is addressing all of his concerns.   09/07/23 NEUROMUSCULAR RE-EDUCATION: To improve coordination, kinesthesia and proprioception.  Quadruped cat/cow x 10 Quadruped fire hydrants x 10 B- using hand grips Bird dog x 10 B Ab sets orange pball 10x5 LE pedals + TRA  2x15  THERAPEUTIC EXERCISE: To improve strength, endurance, ROM, and flexibility.  Seated KTOS piriformis stretch x 30 B  THERAPEUTIC ACTIVITIES: To improve functional performance.  Standing trunk rotation green TB x 15 B Standing pallof press GTB x 20 in semi tandem Standing chops GTB x 20 in semi tandem   08/31/2023 THERAPEUTIC EXERCISE: To improve strength and endurance.  Demonstration, verbal and tactile cues throughout for technique.  NuStep - L6 x 8 min  NEUROMUSCULAR RE-EDUCATION: To improve coordination, kinesthesia, posture, and proprioception. Quadruped thread and reach 2 x 10 bil Quadruped hip ABD & ER 2 x 10 bil, yoga block balanced on low back to promote neutral spine Prone from knees over green Pball - cervical retraction, thoracic extension + I's & T's x 10 each, cues for neutral cervical spine alignment avoiding flexion or extension   PATIENT EDUCATION:  Education details: progress with PT and ongoing PT POC  Person educated: Patient Education method: Explanation Education comprehension: verbalized understanding  HOME EXERCISE PROGRAM: *Pt has MedBridgeGO access but also prefers printed handouts.  Access Code: [email or web address] URL: [email or web address] Date: 09/14/2023 Prepared by: Elijah Hidden  Exercises - Supine Hamstring Stretch with Strap  - 1-2 x daily - 7 x weekly - 3 reps - 30 sec hold - Supine Iliotibial Band Stretch with Strap  - 1-2 x daily - 7 x weekly - 3 reps - 30 sec hold - Prone Hip Flexor & Quad Stretch with Strap  - 1-2 x daily - 7 x weekly - 3 reps - 30 sec hold - Hip Flexor Stretch with Strap on Table  - 1-2 x daily - 7 x weekly - 3 reps - 30 sec hold - Supine Gluteus Stretch  - 1-2 x daily - 7 x weekly - 3 reps - 30 sec hold - Supine Piriformis Stretch with Foot on Ground  - 1-2 x daily - 7 x weekly - 3 reps - 30 sec hold - Supine Lower Trunk Rotation  - 1-2 x daily - 7 x weekly - 2 sets - 5 reps - 10 sec hold - Supine Hip  Adduction Isometric with Ball  - 1-2 x daily - 7 x weekly - 2 sets - 10 reps - 5 sec hold - Doorway Pec Stretch at 90 Degrees Abduction  - 1-2 x daily - 7 x weekly - 3 reps - 30 sec hold - Seated Scapular Retraction with External Rotation  - 1-2 x daily - 7 x weekly - 2 sets - 10 reps - 3-5 sec hold - Hooklying  Single Leg Bent Knee Fallouts with Resistance  - 1 x daily - 3 x weekly - 2 sets - 10 reps - 3 sec hold - Supine Shoulder Horizontal Abduction with Resistance  - 1 x daily - 3 x weekly - 2 sets - 10 reps - 3 sec hold - Standing Shoulder Row with Anchored Resistance  - 1 x daily - 3 x weekly - 2 sets - 10 reps - 5 sec hold - Scapular Retraction with Resistance Advanced  - 1 x daily - 3 x weekly - 2 sets - 10 reps - 5 sec hold - Standing Shoulder Horizontal Abduction with Resistance  - 1 x daily - 3 x weekly - 2 sets - 10 reps - 3 sec hold - Shoulder External Rotation and Scapular Retraction with Resistance  - 1 x daily - 3 x weekly - 2 sets - 10 reps - 3-5 sec hold - Standing Anti-Rotation Press with Anchored Resistance  - 1 x daily - 3 x weekly - 2 sets - 10 reps - 3 sec hold - Standing Hip Flexion with Anchored Resistance and Chair Support  - 1 x daily - 3 x weekly - 2 sets - 10 reps - 3 sec hold - Standing Hip Adduction with Resistance  - 1 x daily - 3 x weekly - 2 sets - 10 reps - 3 sec hold - Standing Hip Extension with Resistance  - 1 x daily - 3 x weekly - 2 sets - 10 reps - 3 sec hold - Standing Hip Abduction with Resistance  - 1 x daily - 3 x weekly - 2 sets - 10 reps - 3 sec hold - Seated Hamstring Curl with Anchored Resistance  - 1 x daily - 3 x weekly - 2 sets - 10 reps - 3-5 sec hold - Sitting Knee Extension with Resistance  - 1 x daily - 3 x weekly - 2 sets - 10 reps - 3-5 sec hold - Reverse Chop with Resistance  - 1 x daily - 3 x weekly - 2 sets - 10 reps - Standing Trunk Rotation with Resistance  - 1 x daily - 3 x weekly - 2 sets - 10 reps - Sidelying Thoracic Rotation with  Open Book  - 1 x daily - 3 x weekly - 2 sets - 10 reps - Quadruped With Rotation: Thread The Needle  - 1 x daily - 3 x weekly - 2 sets - 10 reps - 3 sec hold - Quadruped Hip Abduction and External Rotation  - 1 x daily - 3 x weekly - 2 sets - 10 reps - 3 sec hold - Prone Shoulder Extension on Swiss Ball  - 1 x daily - 3 x weekly - 2 sets - 10 reps - 3 sec hold - Prone Middle Trapezius Strengthening on Swiss Ball  - 1 x daily - 3 x weekly - 2 sets - 10 reps - 3 sec hold - Anti-Rotation Sidestepping with Resistance  - 1 x daily - 3 x weekly - 2 sets - 10 reps - 3 sec hold - Clamshell with Resistance  - 1 x daily - 3 x weekly - 2 sets - 10 reps - 3-5 sec hold - Bridge with Hip Abduction and Resistance  - 1 x daily - 3 x weekly - 2 sets - 10 reps - 5 sec hold - Standing Hip Hinge with Dowel  - 1 x daily - 3 x weekly - 2 sets - 10  reps - 3 sec hold   ASSESSMENT:  CLINICAL IMPRESSION: 7/29:  Added chops and resisted backswings for antagonistic muscle groups for better control in downswing and hopefully less pain as strength improves.  He had difficulty with 1/2 kneel and tall kneel positions maintaining balance and compensates by leaving the forward leg out to the side in 1/2 kneel.   Needs work on this for postural stability.     EVAL: Bernerd Terhune is a 68 y.o. male who was referred to physical therapy for evaluation and treatment for R sided LBP 2 lumbar DDD.  Patient also c/o intermittent L UE radiculopathy (vibration sensation).  Patient reports onset of R sided LBP pain beginning ~2 months ago w/o know MOI.  Triggers for LBP and R UE radiculopathy are unpredictable.  Patient has deficits in cervical and lumbar ROM, proximal B LE flexibility, L>R UE/scapular strength, B LE strength, core/postural weakness with abnormal posture, and abnormal muscle tension which are interfering with ADLs and are impacting quality of life.  On Modified Oswestry patient scored 18/50 demonstrating 36% or moderate  disability.  On QuickDASH patient scored 29.5/100 demonstrating 29.5% disability.  He also notes impaired balance which will be formally assessed next visit.  Tiara will benefit from skilled PT to address above deficits to improve mobility and activity tolerance with decreased pain interference.     OBJECTIVE IMPAIRMENTS: decreased activity tolerance, decreased balance, decreased knowledge of condition, decreased mobility, difficulty walking, decreased ROM, decreased strength, increased fascial restrictions, impaired perceived functional ability, increased muscle spasms, impaired flexibility, impaired sensation, impaired UE functional use, improper body mechanics, postural dysfunction, and pain.   ACTIVITY LIMITATIONS: carrying, lifting, bending, sitting, standing, squatting, sleeping, stairs, transfers, bed mobility, reach over head, locomotion level, and caring for others  PARTICIPATION LIMITATIONS: meal prep, cleaning, laundry, community activity, and yard work  PERSONAL FACTORS: Age, Fitness, Past/current experiences, Time since onset of injury/illness/exacerbation, and 3+ comorbidities: DM-II, obesity, elevated BP, GERD, B patellofemoral pain syndrome, B hip pain, h/o R RTC tendinitis, chronic B LBP w/o sciatica, scoliosis, diabetic peripheral neuropathy are also affecting patient's functional outcome.   REHAB POTENTIAL: Good  CLINICAL DECISION MAKING: Evolving/moderate complexity  EVALUATION COMPLEXITY: Moderate   GOALS: Goals reviewed with patient? Yes  SHORT TERM GOALS: Target date: 09/07/2023  Patient will be independent with initial HEP to improve outcomes and carryover.  Baseline:  08/08/23 - reviewed today Goal status: MET - 08/17/23  2.  Patient will report 25% improvement in low back pain to improve QOL. Baseline: 0/10 on eval, up to at least 7/10  08/17/23 - Twinges remain unpredictable but very short-lived Goal status: MET - 08/31/23 - pt reports overall reduction in pain  frequency and intensity by 50%   3.  Patient will report 25% reduction in frequency and intensity of L UE radiculopathy to improve QOL. Baseline: 3-5/10 on eval, up to 8-9/10 08/17/23 - still come and goes  Goal status: IN PROGRESS - 09/23/23 - buzzing essentially unchanged - still come and goes   LONG TERM GOALS: Target date: 10/12/2023  Patient will be independent with ongoing/advanced HEP for self-management at home.  Baseline:  Goal status: IN PROGRESS - 08/31/23 - met for current HEP, anticipate further updates  2.  Patient will report 50-75% improvement in low back pain to improve QOL.  Baseline: 0/10 on eval, up to at least 7/10 Goal status: IN PROGRESS - 08/31/23 - pt reports overall reduction in pain frequency and intensity by 50%   3.  Patient  will report 50-75% reduction in frequency and intensity of L UE radiculopathy to improve QOL. Baseline: 3-5/10 on eval, up to 8-9/10 Goal status: IN PROGRESS - 08/31/23 - buzzing essentially unchanged - still come and goes   4.  Patient to demonstrate ability to achieve and maintain good spinal alignment/posturing and body mechanics needed for daily activities. Baseline: rounded shoulders, forward head, decreased lumbar lordosis, increased thoracic kyphosis, and flexed trunk posture Goal status: IN PROGRESS - 09/09/23 - improving postural awareness but continued intermittent cues necessary  5.  Patient will demonstrate functional pain free cervical and lumbar ROM to perform ADLs.   Baseline: Refer to above cervical and lumbar ROM table Goal status: IN PROGRESS - 09/09/23 - improving cervical and lumbar ROM with remaining restriction more due to tightness than pain  6.  Patient will demonstrate improved scapular, B shoulder and B LE strength to >/= 4+/5 for improved stability and ease of mobility. Baseline: Refer to above UE & LE MMT tables Goal status: IN PROGRESS - 09/09/23 - met except B lower trap for UE and Hip ABD/ADD and ER for LE  7.  Patient will report </= 24% on Modified Oswestry (MCID = 12%) to demonstrate improved functional ability with decreased pain interference. Baseline: 18 / 50 = 36.0 % Goal status: IN PROGRESS - 09/09/23 - 20 / 50 = 40.0 %  8.  Patient will report </= 16% on QuickDASH to demonstrate improved functional ability with decreased L UE radiculopathy interference. Baseline: 29.5 / 100 = 29.5 % Goal status: IN PROGRESS - 09/07/23 - 27.3/100   9.  Patient will improve FGA score by at least 4 points or to >/= 28/30 (baseline as of prior D/C from PT) to improve gait stability and reduce risk for falls.  Baseline: 22/30 - 08/10/23 Goal status: IN PROGRESS - 09/09/23 - 24/30   PLAN:  PT FREQUENCY: 2x/week  PT DURATION: 8-10 weeks  PLANNED INTERVENTIONS: 02835- PT Re-evaluation, 97750- Physical Performance Testing, 97110-Therapeutic exercises, 97530- Therapeutic activity, 97112- Neuromuscular re-education, 97535- Self Care, 02859- Manual therapy, [phone]- Gait training, [phone]- Aquatic Therapy, [phone]- Electrical stimulation (unattended), [phone]- Ultrasound, [id number]- Traction (mechanical), [id number]- Ionotophoresis 4mg /ml Dexamethasone , 79439 (1-2 muscles), 20561 (3+ muscles)- Dry Needling, Patient/Family education, Balance training, Stair training, Taping, Joint mobilization, Spinal mobilization, Cryotherapy, and Moist heat  PLAN FOR NEXT SESSION: Continue with resisted TB rotational and stabilization exercises in standing and also tall/half kneeling; continue with rowing for thoracic retraction in golf stance position;  try doing some deadlifts for back stability holding golf stance;  continue lunge position strengthening;  might help to try teaching him/caregiver how to do KT tape for kinesthetic reminders maintaining lumbar lordosis and scapular retraction prior to playing golf  [employer], PT 09/28/2023, 9:58 AM

## 2023-09-28 ENCOUNTER — Ambulatory Visit: Admitting: Rehabilitation

## 2023-09-29 ENCOUNTER — Ambulatory Visit

## 2023-09-29 DIAGNOSIS — R293 Abnormal posture: Secondary | ICD-10-CM

## 2023-09-29 DIAGNOSIS — M5459 Other low back pain: Secondary | ICD-10-CM | POA: Diagnosis not present

## 2023-09-29 DIAGNOSIS — M5412 Radiculopathy, cervical region: Secondary | ICD-10-CM

## 2023-09-29 DIAGNOSIS — M6281 Muscle weakness (generalized): Secondary | ICD-10-CM

## 2023-09-29 NOTE — Therapy (Signed)
 OUTPATIENT PHYSICAL THERAPY TREATMENT     Patient Name: Noah Lane MRN: [id number] DOB:Nov 27, 1955, 68 y.o., male Today's Date: 09/29/2023  END OF SESSION:  PT End of Session - 09/29/23 0812     Visit Number 16    Date for PT Re-Evaluation 10/12/23    Authorization Type Medicare & BCBS    Progress Note Due on Visit 20    PT Start Time 0807   pt late   PT Stop Time 0847    PT Time Calculation (min) 40 min    Activity Tolerance Patient tolerated treatment well    Behavior During Therapy WFL for tasks assessed/performed                 Past Medical History:  Diagnosis Date   Broken toe    Diabetes mellitus type 2 in obese    Mixed hyperlipidemia    Past Surgical History:  Procedure Laterality Date   COLON RESECTION N/A 04/27/2022   Procedure: LAPAROSCOPIC HAND ASSISTED RIGHT HEMI COLECTOMY, POSSIBLE OPEN;  Surgeon: Dasie Leonor CROME, MD;  Location: [facility];  Service: General;  Laterality: N/A;   COLONOSCOPY     HERNIA REPAIR  2012   Patient Active Problem List   Diagnosis Date Noted   Elevated blood pressure reading 05/04/2022   Colon adenocarcinoma (HCC) 05/04/2022   Gastroesophageal reflux disease 04/29/2022   Ileus, postoperative (HCC) 04/29/2022   Hypokalemia 04/28/2022   Hyperlipidemia 04/28/2022   Colonic obstruction (HCC) 04/25/2022   Hyponatremia 04/25/2022   OSA on CPAP 12/03/2021   Obesity (BMI 30.0-34.9) 12/03/2021   Mixed hyperlipidemia 10/20/2021   Strain of lumbar region 10/16/2021   Strain of calf muscle 07/22/2021   Patellofemoral pain syndrome of both knees 02/24/2021   Rotator cuff tendinitis, right 12/28/2019   Hip pain, bilateral 05/28/2019   Chronic bilateral low back pain without sciatica 05/28/2019   Type 2 diabetes mellitus with obesity (HCC) 05/28/2019   Erectile dysfunction 05/28/2019   Pain and swelling of left knee 03/12/2019   Lateral epicondylitis, right elbow 03/15/2017   Diverticulosis of large intestine without  hemorrhage 02/25/2014    PCP: Frann Mabel Mt, DO   REFERRING PROVIDER: Arvell Evalene SAUNDERS, DO   REFERRING DIAG: [phone] (ICD-10-CM) - Degeneration of intervertebral disc of lumbar region, unspecified whether pain present   THERAPY DIAG:  Other low back pain  Radiculopathy, cervical region  Muscle weakness (generalized)  Abnormal posture  RATIONALE FOR EVALUATION AND TREATMENT: Rehabilitation  ONSET DATE: early April 2025  NEXT MD VISIT: None scheduled with referring provider   SUBJECTIVE:  SUBJECTIVE STATEMENT: Pt reports that he get occasional twinges in his back but nothing else new.  EVAL:  Pt reports he started getting LBP, mostly on R side, ~2 months ago w/o known MOI.  Pain first noted while rolling over in bed.  Pain comes in strikes w/o predictable trigger.  Pain is momentary lasting a few minutes when present.  He also reports recent onset of L UE numbness and tingling that he says Dr. Frann recommended he mention to PT.  UE radiculopathy also unpredictable in trigger and varies in duration.  He notes constant stiffness in neck, but denies pain.  He also reports balance issues, esp initially on standing, which he attributes to a weak core and his neuropathy.  PAIN: Are you having pain? No and Yes: NPRS scale: 0/10   Pain location: R low back  Pain description: exercise soreness  Aggravating factors: unpredictable  Relieving factors: remain still until pain resolves   Are you having pain? Yes: NPRS scale: comes and goes at all types of intensities  Pain location: L UE down to radial side of hand, occasional in thumb  Pain description: vibration (reminds him of a TENS unit), stiffness in neck  Aggravating factors: unpredictable  Relieving factors: heating  pads, stretches   PERTINENT HISTORY:  DM-II, obesity, elevated BP, GERD, B patellofemoral pain syndrome, B hip pain, h/o R RTC tendinitis, chronic B LBP w/o sciatica, scoliosis, diabetic peripheral neuropathy  PRECAUTIONS: None  RED FLAGS: None  WEIGHT BEARING RESTRICTIONS: No  FALLS:  Has patient fallen in last 6 months? 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 I would like the shoulder thing to go away. Better balance.   OBJECTIVE: (objective measures completed at initial evaluation unless otherwise dated)  DIAGNOSTIC FINDINGS:  10/16/21 - DG lumbar spine IMPRESSION: No acute fracture or subluxation of the lumbar spine.  PATIENT SURVEYS:  Modified Oswestry 18 / 50 = 36.0 %, moderate disability   Quick Dash 29.5 / 100 = 29.5 %  COGNITION:  Overall cognitive status: History of cognitive impairments - at baseline (mostly STM memory recall)    SENSATION: WFL B peripheral neuropathy in feet  POSTURE:  rounded shoulders, forward head, decreased lumbar lordosis, increased thoracic kyphosis, and flexed trunk   PALPATION: Increased muscle tension in L>R UT and LS as well as R>L lumbar paraspinals and glutes/piriformis  CERVICAL ROM:   Active ROM Eval 09/09/23  Flexion 49 52  Extension 42 48  Right lateral flexion 24 32  Left lateral flexion 16 24  Right rotation 51 61  Left rotation 52 58   (Blank rows = not tested)  UPPER EXTREMITY MMT:  MMT Right eval Left eval R  09/09/23 L 09/09/23 R 09/23/23 L 09/23/23  Shoulder flexion 4+ 4- 5 4+ 5 5  Shoulder extension 4+ 4+ 5 5 5 5   Shoulder abduction 4+ 4 5 5 5 5   Shoulder adduction        Shoulder internal rotation 4+ 4+ 5 5  5 5  Shoulder external rotation 4 4 4+ 4+ 5 5  Middle trapezius 4- 4- 4+ 4+ 4+ 4+  Lower trapezius 3+ 3+ 4 4 4 4   Grip strength         (Blank rows = not tested)  LUMBAR ROM:   Active  Eval 09/09/23  Flexion Hands to mid shins Fingertips to toes  Extension 25% limited WFL  Right lateral flexion Hand to femoral condyle - tight Hand to femoral condyle - heavy tightness  Left lateral flexion Hand to lateral knee - twinge in R low back Hand to femoral condyle - heavy tightness  Right rotation [facility] WFL  Left rotation [facility] WFL  (Blank rows = not tested)  MUSCLE LENGTH: Hamstrings: mod/severe tight L>R ITB: mod/severe tight B Piriformis: mild/mod tight R>L Hip flexors: mod tight B Quads: mod tight R>L  LOWER EXTREMITY ROM:    Grossly WFL  LOWER EXTREMITY MMT:    MMT Right eval Left eval R 09/09/23 L 09/09/23  Hip flexion 4 4 4+ 4+  Hip extension 4- 4- 4+ 4+  Hip abduction 4- 4- 4 4+  Hip adduction 4- 3+ 4+ 4  Hip internal rotation 5 4+ 5 (limited ROM) 5 (limited ROM)  Hip external rotation 4- 3+ 4- 4 (limited ROM)  Knee flexion 5 4+ 5 5  Knee extension 5 5 5 5   Ankle dorsiflexion 4+ 4+ 5 5  Ankle plantarflexion      Ankle inversion      Ankle eversion       (Blank rows = not tested)  LUMBAR SPECIAL TESTS:  Straight leg raise test: Negative and Slump test: Negative  FUNCTIONAL TESTS: (08/10/23) 5 times sit to stand: 15.75 sec Functional gait assessment: 22/30; indicated medium risk for falls Berg balance scale: 50/56; indicates moderate (>50%) risk for falls    09/09/23: FGA = 24/30, indicative of medium fall risk   Scores as of D/C from PT in July 2024: Berg: 54/56 FGA: 28/30   TODAY'S TREATMENT:  09/29/23 THERAPEUTIC EXERCISE: To improve strength and  endurance.  Demonstration, verbal and tactile cues throughout for technique. Elliptical 1.5 x 6 min NEUROMUSCULAR RE-EDUCATION: To improve balance and posture. Standing pallof press in semi tandem stance x 20 GTB Standing shoulder ext on airex pad x 20 GTB Standing shoulder ext + march GTB x 20 Standing toe taps 3 way from airex x 10 B Standing cone knock downs and pick ups x 10 B Standing modified SLS with rotations   09/27/23 THERAPEUTIC EXERCISE: To improve strength and endurance.  Demonstration, verbal and tactile cues throughout for technique.  NuStep L5 x 5' Elliptical- L1 x 2' F/2'B min   THERAPEUTIC ACTIVITIES: To improve functional performance.  Demonstration, verbal and tactile cues throughout for technique. Low row from manually corrected golf stance position Blue TB x 20 BUE  NEUROMUSCULAR RE-EDUCATION: To improve balance and posture. RTB BUE resisted backswing x 2/10 RTB BUE trunk stabilization w/ side lift/slide BUE (shlr ABD RUE, ADD LUE) resisted chop/downswing blue TB x 2/10 BUE MFI walkouts x 5 laps 1/2 kneeling chop Blue TB x 10 BUE Tall kneeling resisted back swing YTB x 2/10 BUE  09/23/23 THERAPEUTIC EXERCISE: To improve strength and endurance.  Demonstration, verbal and tactile cues throughout for technique.  Elliptical- L1x70min  NEUROMUSCULAR RE-EDUCATION: To improve balance, coordination, kinesthesia, posture, and proprioception. Fwd lunges 2 x 10 R/L Lateral lunges 2 x 10 R/L Pallof press GTB x 20 each side in semi tandem Shld  ext with rotation GTB x 20 one arm at a time Standing oscillations in shld ext GTB fwd x 10 B; side to side x 10 B  09/21/23 THERAPEUTIC EXERCISE: To improve strength and endurance.  Demonstration, verbal and tactile cues throughout for technique.  Elliptical- L1x12min  Open Book Thoacolumbar Stretch w/ top knee bent x 10 B- 3-5 Sustained bridge w/ GTB + hip ABD 2x10  Supine Hip Add w/ ball squeeze 2x10 THERAPEUTIC ACTIVITIES: To  improve functional performance.  Demonstration, verbal and tactile cues throughout for technique. Standing Chop w/ GTB 2x10 B Standing Lift w/ full trunk rotation w/ GTB 2x10  NEUROMUSCULAR RE-EDUCATION: To improve balance, coordination, kinesthesia, posture, and proprioception. Stationary Lunges x10 B- pt has small LOB w/ L foot fwd    09/14/23 NEUROMUSCULAR RE-EDUCATION: To improve balance, coordination, kinesthesia, posture, and proprioception. Functional squat + OH raise x 10, with 3# in B hands x 10 Multifidi walkout with GTB resistance x 10 B - cues to keep scapular and core muscles engaged Standing hip hinge with pole along spine 2 x 10 Sustained bridge + GTB clam/hip ABD 2 x 10  THERAPEUTIC ACTIVITIES: To improve functional performance.  Demonstration, verbal and tactile cues throughout for technique. Standing lift and chop with GTB 2 x 10 B - 1st sets with focus on UE diagonal with stable core, 2nd sets adding full trunk rotation (working toward core activation with mechanics of golf swing)  THERAPEUTIC EXERCISE: To improve strength and endurance.  Demonstration, verbal and tactile cues throughout for technique.  S/L GTB clam 2 x 10   09/12/23 THERAPEUTIC EXERCISE: To improve strength and endurance.   Elliptical - L1.0 x 5 min  NEUROMUSCULAR RE-EDUCATION: To improve balance, coordination, kinesthesia, posture, proprioception, and reduce fall risk.  Tandem stance 2 x 30 with each foot forward - intermittent UE support on counter  Tandem gait: fwd 4 x 10 ft, backward 2 x 10 ft - intermittent UE support on counter  Staggered stance progressing to tandem stance + GTB pallof press x 10 bil with each foot fwd Corner balance progression with tandem stance on firm surface with arms at sides            Eyes open: static stance x 30 sec horiz head turns x 5 vertical head nods x 5 trunk rotation x 5 Eyes closed: static stance x 15-20 sec   09/09/23 THERAPEUTIC EXERCISE: To improve  strength and endurance.  Demonstration, verbal and tactile cues throughout for technique.  NuStep - L6 x 8 min  PHYSICAL PERFORMANCE TEST or MEASUREMENT: Modified Oswestry: 20 / 50 = 40.0 % Functional Gait  Assessment  Gait Level Surface Walks 20 ft in less than 5.5 sec, no assistive devices, good speed, no evidence for imbalance, normal gait pattern, deviates no more than 6 in outside of the 12 in walkway width.   Change in Gait Speed Able to smoothly change walking speed without loss of balance or gait deviation. Deviate no more than 6 in outside of the 12 in walkway width.   Gait with Horizontal Head Turns Performs head turns smoothly with no change in gait. Deviates no more than 6 in outside 12 in walkway width   Gait with Vertical Head Turns Performs head turns with no change in gait. Deviates no more than 6 in outside 12 in walkway width.   Gait and Pivot Turn Pivot turns safely within 3 sec and stops quickly with no loss of balance.   Step Over Obstacle  Is able to step over one shoe box (4.5 in total height) without changing gait speed. No evidence of imbalance.   Gait with Narrow Base of Support Ambulates 4-7 steps.   Gait with Eyes Closed Walks 20 ft, uses assistive device, slower speed, mild gait deviations, deviates 6-10 in outside 12 in walkway width. Ambulates 20 ft in less than 9 sec but greater than 7 sec.   Ambulating Backwards Walks 20 ft, uses assistive device, slower speed, mild gait deviations, deviates 6-10 in outside 12 in walkway width.   Steps Alternating feet, must use rail.   Total Score 24   FGA comment: 19-24 = medium risk fall      THERAPEUTIC ACTIVITIES: To improve functional performance.  Demonstration, verbal and tactile cues throughout for technique. Cervical & lumbar ROM assessment UE/LE MMT Goal assessment  SELF CARE:  Review of progress with PT, status of goals, and plans for ongoing PT POC requesting patient input and feedback to ensure PT is addressing  all of his concerns.   09/07/23 NEUROMUSCULAR RE-EDUCATION: To improve coordination, kinesthesia and proprioception.  Quadruped cat/cow x 10 Quadruped fire hydrants x 10 B- using hand grips Bird dog x 10 B Ab sets orange pball 10x5 LE pedals + TRA 2x15  THERAPEUTIC EXERCISE: To improve strength, endurance, ROM, and flexibility.  Seated KTOS piriformis stretch x 30 B  THERAPEUTIC ACTIVITIES: To improve functional performance.  Standing trunk rotation green TB x 15 B Standing pallof press GTB x 20 in semi tandem Standing chops GTB x 20 in semi tandem   08/31/2023 THERAPEUTIC EXERCISE: To improve strength and endurance.  Demonstration, verbal and tactile cues throughout for technique.  NuStep - L6 x 8 min  NEUROMUSCULAR RE-EDUCATION: To improve coordination, kinesthesia, posture, and proprioception. Quadruped thread and reach 2 x 10 bil Quadruped hip ABD & ER 2 x 10 bil, yoga block balanced on low back to promote neutral spine Prone from knees over green Pball - cervical retraction, thoracic extension + I's & T's x 10 each, cues for neutral cervical spine alignment avoiding flexion or extension   PATIENT EDUCATION:  Education details: progress with PT and ongoing PT POC  Person educated: Patient Education method: Explanation Education comprehension: verbalized understanding  HOME EXERCISE PROGRAM: *Pt has MedBridgeGO access but also prefers printed handouts.  Access Code: [email or web address] URL: [email or web address] Date: 09/14/2023 Prepared by: Elijah Hidden  Exercises - Supine Hamstring Stretch with Strap  - 1-2 x daily - 7 x weekly - 3 reps - 30 sec hold - Supine Iliotibial Band Stretch with Strap  - 1-2 x daily - 7 x weekly - 3 reps - 30 sec hold - Prone Hip Flexor & Quad Stretch with Strap  - 1-2 x daily - 7 x weekly - 3 reps - 30 sec hold - Hip Flexor Stretch with Strap on Table  - 1-2 x daily - 7 x weekly - 3 reps - 30 sec hold - Supine Gluteus Stretch  - 1-2 x  daily - 7 x weekly - 3 reps - 30 sec hold - Supine Piriformis Stretch with Foot on Ground  - 1-2 x daily - 7 x weekly - 3 reps - 30 sec hold - Supine Lower Trunk Rotation  - 1-2 x daily - 7 x weekly - 2 sets - 5 reps - 10 sec hold - Supine Hip Adduction Isometric with Ball  - 1-2 x daily - 7 x weekly - 2 sets - 10 reps - 5 sec hold -  Doorway Pec Stretch at 90 Degrees Abduction  - 1-2 x daily - 7 x weekly - 3 reps - 30 sec hold - Seated Scapular Retraction with External Rotation  - 1-2 x daily - 7 x weekly - 2 sets - 10 reps - 3-5 sec hold - Hooklying Single Leg Bent Knee Fallouts with Resistance  - 1 x daily - 3 x weekly - 2 sets - 10 reps - 3 sec hold - Supine Shoulder Horizontal Abduction with Resistance  - 1 x daily - 3 x weekly - 2 sets - 10 reps - 3 sec hold - Standing Shoulder Row with Anchored Resistance  - 1 x daily - 3 x weekly - 2 sets - 10 reps - 5 sec hold - Scapular Retraction with Resistance Advanced  - 1 x daily - 3 x weekly - 2 sets - 10 reps - 5 sec hold - Standing Shoulder Horizontal Abduction with Resistance  - 1 x daily - 3 x weekly - 2 sets - 10 reps - 3 sec hold - Shoulder External Rotation and Scapular Retraction with Resistance  - 1 x daily - 3 x weekly - 2 sets - 10 reps - 3-5 sec hold - Standing Anti-Rotation Press with Anchored Resistance  - 1 x daily - 3 x weekly - 2 sets - 10 reps - 3 sec hold - Standing Hip Flexion with Anchored Resistance and Chair Support  - 1 x daily - 3 x weekly - 2 sets - 10 reps - 3 sec hold - Standing Hip Adduction with Resistance  - 1 x daily - 3 x weekly - 2 sets - 10 reps - 3 sec hold - Standing Hip Extension with Resistance  - 1 x daily - 3 x weekly - 2 sets - 10 reps - 3 sec hold - Standing Hip Abduction with Resistance  - 1 x daily - 3 x weekly - 2 sets - 10 reps - 3 sec hold - Seated Hamstring Curl with Anchored Resistance  - 1 x daily - 3 x weekly - 2 sets - 10 reps - 3-5 sec hold - Sitting Knee Extension with Resistance  - 1 x daily - 3  x weekly - 2 sets - 10 reps - 3-5 sec hold - Reverse Chop with Resistance  - 1 x daily - 3 x weekly - 2 sets - 10 reps - Standing Trunk Rotation with Resistance  - 1 x daily - 3 x weekly - 2 sets - 10 reps - Sidelying Thoracic Rotation with Open Book  - 1 x daily - 3 x weekly - 2 sets - 10 reps - Quadruped With Rotation: Thread The Needle  - 1 x daily - 3 x weekly - 2 sets - 10 reps - 3 sec hold - Quadruped Hip Abduction and External Rotation  - 1 x daily - 3 x weekly - 2 sets - 10 reps - 3 sec hold - Prone Shoulder Extension on Swiss Ball  - 1 x daily - 3 x weekly - 2 sets - 10 reps - 3 sec hold - Prone Middle Trapezius Strengthening on Swiss Ball  - 1 x daily - 3 x weekly - 2 sets - 10 reps - 3 sec hold - Anti-Rotation Sidestepping with Resistance  - 1 x daily - 3 x weekly - 2 sets - 10 reps - 3 sec hold - Clamshell with Resistance  - 1 x daily - 3 x weekly - 2 sets - 10  reps - 3-5 sec hold - Bridge with Hip Abduction and Resistance  - 1 x daily - 3 x weekly - 2 sets - 10 reps - 5 sec hold - Standing Hip Hinge with Dowel  - 1 x daily - 3 x weekly - 2 sets - 10 reps - 3 sec hold   ASSESSMENT:  CLINICAL IMPRESSION: Continued work on higher level balance to help with golf swinging. Pt still showing some difficulty with single leg stance. CGA given throughout session.    EVAL: Murphy Duzan is a 68 y.o. male who was referred to physical therapy for evaluation and treatment for R sided LBP 2 lumbar DDD.  Patient also c/o intermittent L UE radiculopathy (vibration sensation).  Patient reports onset of R sided LBP pain beginning ~2 months ago w/o know MOI.  Triggers for LBP and R UE radiculopathy are unpredictable.  Patient has deficits in cervical and lumbar ROM, proximal B LE flexibility, L>R UE/scapular strength, B LE strength, core/postural weakness with abnormal posture, and abnormal muscle tension which are interfering with ADLs and are impacting quality of life.  On Modified Oswestry patient  scored 18/50 demonstrating 36% or moderate disability.  On QuickDASH patient scored 29.5/100 demonstrating 29.5% disability.  He also notes impaired balance which will be formally assessed next visit.  Desiderio will benefit from skilled PT to address above deficits to improve mobility and activity tolerance with decreased pain interference.     OBJECTIVE IMPAIRMENTS: decreased activity tolerance, decreased balance, decreased knowledge of condition, decreased mobility, difficulty walking, decreased ROM, decreased strength, increased fascial restrictions, impaired perceived functional ability, increased muscle spasms, impaired flexibility, impaired sensation, impaired UE functional use, improper body mechanics, postural dysfunction, and pain.   ACTIVITY LIMITATIONS: carrying, lifting, bending, sitting, standing, squatting, sleeping, stairs, transfers, bed mobility, reach over head, locomotion level, and caring for others  PARTICIPATION LIMITATIONS: meal prep, cleaning, laundry, community activity, and yard work  PERSONAL FACTORS: Age, Fitness, Past/current experiences, Time since onset of injury/illness/exacerbation, and 3+ comorbidities: DM-II, obesity, elevated BP, GERD, B patellofemoral pain syndrome, B hip pain, h/o R RTC tendinitis, chronic B LBP w/o sciatica, scoliosis, diabetic peripheral neuropathy are also affecting patient's functional outcome.   REHAB POTENTIAL: Good  CLINICAL DECISION MAKING: Evolving/moderate complexity  EVALUATION COMPLEXITY: Moderate   GOALS: Goals reviewed with patient? Yes  SHORT TERM GOALS: Target date: 09/07/2023  Patient will be independent with initial HEP to improve outcomes and carryover.  Baseline:  08/08/23 - reviewed today Goal status: MET - 08/17/23  2.  Patient will report 25% improvement in low back pain to improve QOL. Baseline: 0/10 on eval, up to at least 7/10  08/17/23 - Twinges remain unpredictable but very short-lived Goal status: MET - 08/31/23  - pt reports overall reduction in pain frequency and intensity by 50%   3.  Patient will report 25% reduction in frequency and intensity of L UE radiculopathy to improve QOL. Baseline: 3-5/10 on eval, up to 8-9/10 08/17/23 - still come and goes  Goal status: IN PROGRESS - 09/23/23 - buzzing essentially unchanged - still come and goes   LONG TERM GOALS: Target date: 10/12/2023  Patient will be independent with ongoing/advanced HEP for self-management at home.  Baseline:  Goal status: IN PROGRESS - 08/31/23 - met for current HEP, anticipate further updates  2.  Patient will report 50-75% improvement in low back pain to improve QOL.  Baseline: 0/10 on eval, up to at least 7/10 Goal status: MET - 09/29/23 - pt reports  overall reduction in pain frequency and intensity by 75%   3.  Patient will report 50-75% reduction in frequency and intensity of L UE radiculopathy to improve QOL. Baseline: 3-5/10 on eval, up to 8-9/10 Goal status: IN PROGRESS - 08/31/23 - buzzing essentially unchanged - still come and goes   4.  Patient to demonstrate ability to achieve and maintain good spinal alignment/posturing and body mechanics needed for daily activities. Baseline: rounded shoulders, forward head, decreased lumbar lordosis, increased thoracic kyphosis, and flexed trunk posture Goal status: IN PROGRESS - 09/09/23 - improving postural awareness but continued intermittent cues necessary  5.  Patient will demonstrate functional pain free cervical and lumbar ROM to perform ADLs.   Baseline: Refer to above cervical and lumbar ROM table Goal status: IN PROGRESS - 09/09/23 - improving cervical and lumbar ROM with remaining restriction more due to tightness than pain  6.  Patient will demonstrate improved scapular, B shoulder and B LE strength to >/= 4+/5 for improved stability and ease of mobility. Baseline: Refer to above UE & LE MMT tables Goal status: IN PROGRESS - 09/09/23 - met except B lower trap for UE and  Hip ABD/ADD and ER for LE  7. Patient will report </= 24% on Modified Oswestry (MCID = 12%) to demonstrate improved functional ability with decreased pain interference. Baseline: 18 / 50 = 36.0 % Goal status: IN PROGRESS - 09/09/23 - 20 / 50 = 40.0 %  8.  Patient will report </= 16% on QuickDASH to demonstrate improved functional ability with decreased L UE radiculopathy interference. Baseline: 29.5 / 100 = 29.5 % Goal status: IN PROGRESS - 09/07/23 - 27.3/100   9.  Patient will improve FGA score by at least 4 points or to >/= 28/30 (baseline as of prior D/C from PT) to improve gait stability and reduce risk for falls.  Baseline: 22/30 - 08/10/23 Goal status: IN PROGRESS - 09/09/23 - 24/30   PLAN:  PT FREQUENCY: 2x/week  PT DURATION: 8-10 weeks  PLANNED INTERVENTIONS: 02835- PT Re-evaluation, 97750- Physical Performance Testing, 97110-Therapeutic exercises, 97530- Therapeutic activity, 97112- Neuromuscular re-education, 97535- Self Care, 02859- Manual therapy, [phone]- Gait training, [phone]- Aquatic Therapy, [phone]- Electrical stimulation (unattended), [phone]- Ultrasound, [id number]- Traction (mechanical), [id number] Ionotophoresis 4mg /ml Dexamethasone , 79439 (1-2 muscles), 20561 (3+ muscles)- Dry Needling, Patient/Family education, Balance training, Stair training, Taping, Joint mobilization, Spinal mobilization, Cryotherapy, and Moist heat  PLAN FOR NEXT SESSION: Continue with resisted TB rotational and stabilization exercises in standing and also tall/half kneeling; continue with rowing for thoracic retraction in golf stance position;  try doing some deadlifts for back stability holding golf stance;  continue lunge position strengthening;  might help to try teaching him/caregiver how to do KT tape for kinesthetic reminders maintaining lumbar lordosis and scapular retraction prior to playing golf  Felice Deem L Temitope Griffing, PTA 09/29/2023, 9:03 AM

## 2023-09-30 ENCOUNTER — Telehealth: Payer: Self-pay

## 2023-09-30 NOTE — Telephone Encounter (Signed)
 Patient patient assistance Ozempic  arrived , pt notified of medication arrival

## 2023-10-03 IMAGING — MR MR SHOULDER*R* W/O CM
4 of 5 series · 29 of 40 positions shown · non-contrast
Comparison: None.

CLINICAL DATA: Shoulder trauma.  Rotator cuff tear suspected.

EXAM:
MRI OF THE RIGHT SHOULDER WITHOUT CONTRAST
TECHNIQUE: Multiplanar, multisequence MR imaging of the shoulder was performed.
No intravenous contrast was administered.

[Series 8: T2 fat-sat · oblique · right · 4.0mm · 0.55mm/px · 8 of 23 slices shown (1 of 3)]
[im 1/23]
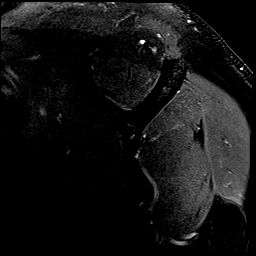
[im 4/23]
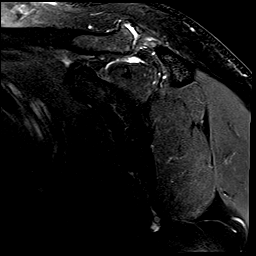
[im 7/23]
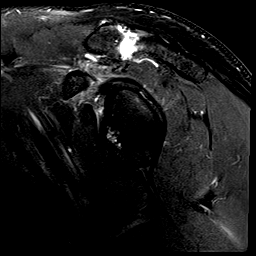
[im 10/23]
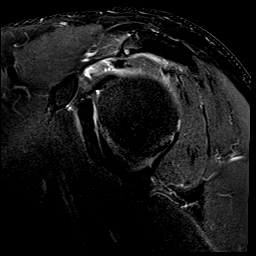
[im 13/23]
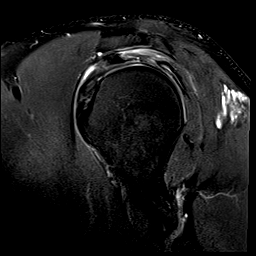
[im 16/23]
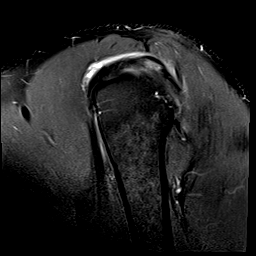
[im 19/23]
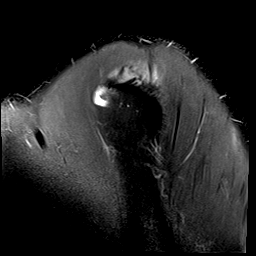
[im 23/23]
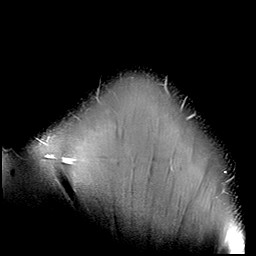

[Series 9: T2 fat-sat · oblique · right · 4.0mm · 0.27mm/px · 7 of 20 slices shown (2 of 3)]
[im 1/20]
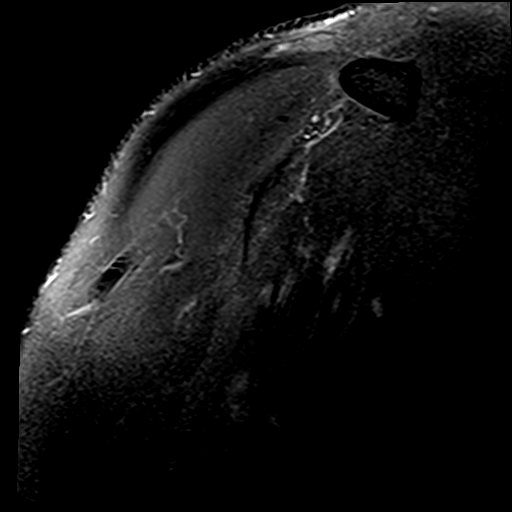
[im 4/20]
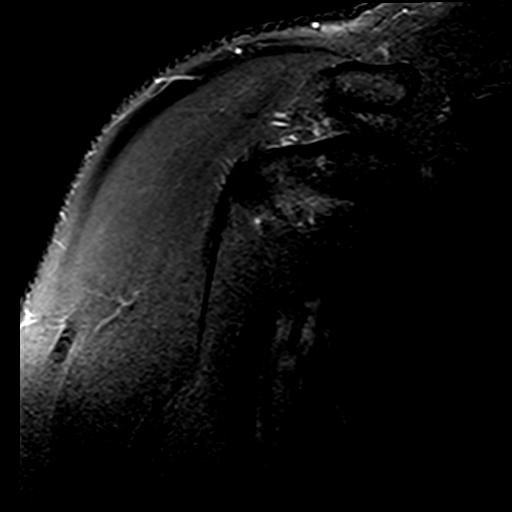
[im 7/20]
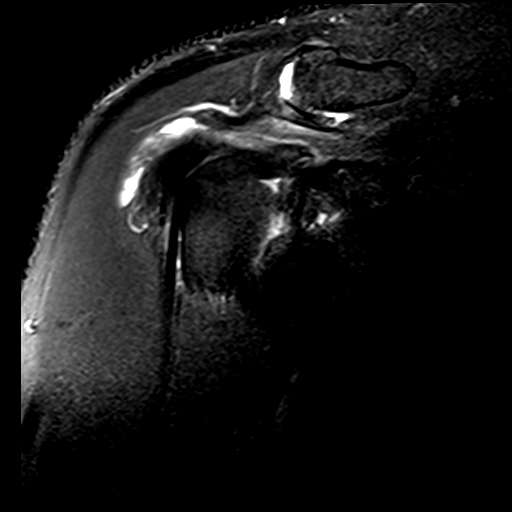
[im 10/20]
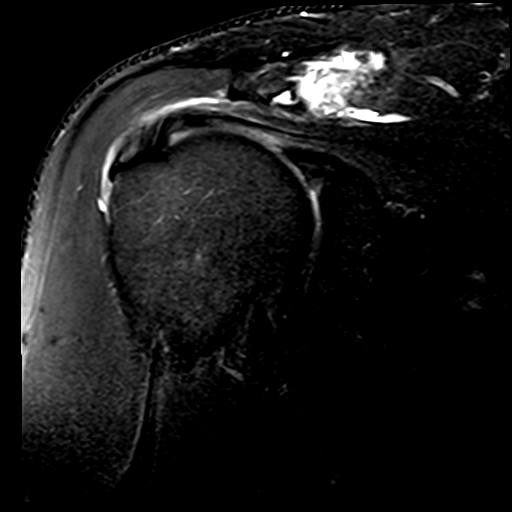
[im 13/20]
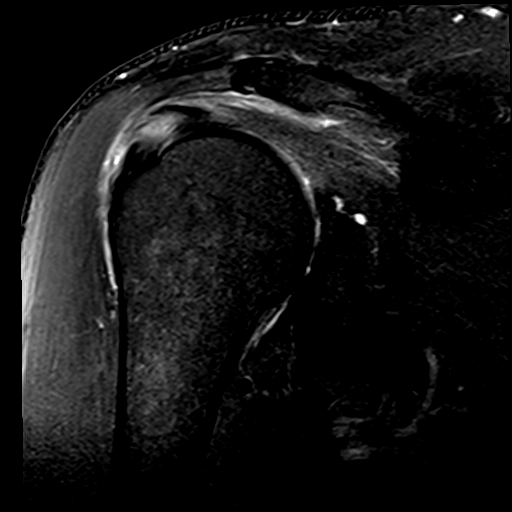
[im 16/20]
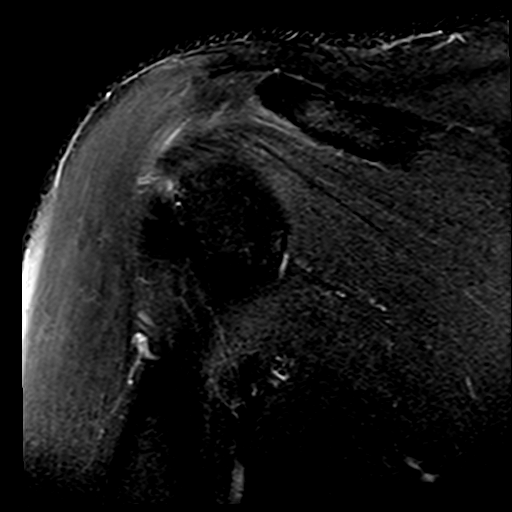
[im 20/20]
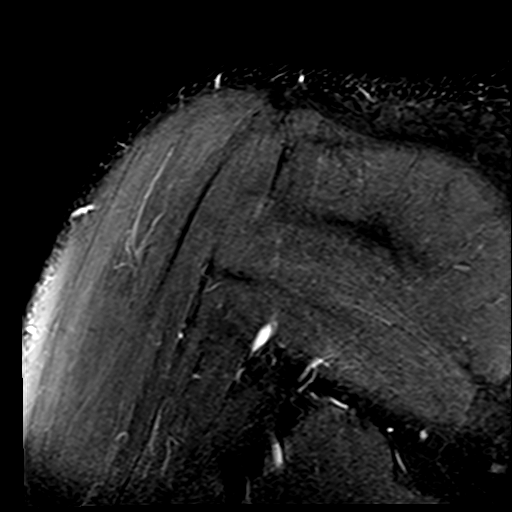

[Series 10: PD · oblique · right · 4.0mm · 0.29mm/px · 7 of 21 slices shown]
[im 1/21]
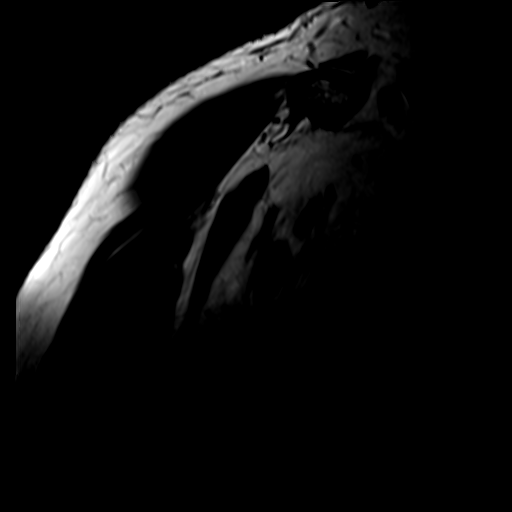
[im 4/21]
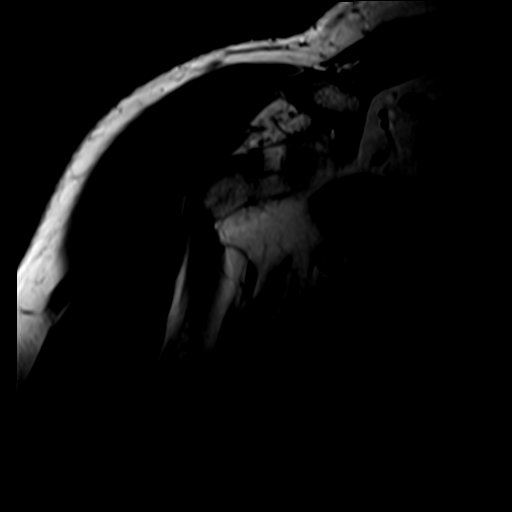
[im 7/21]
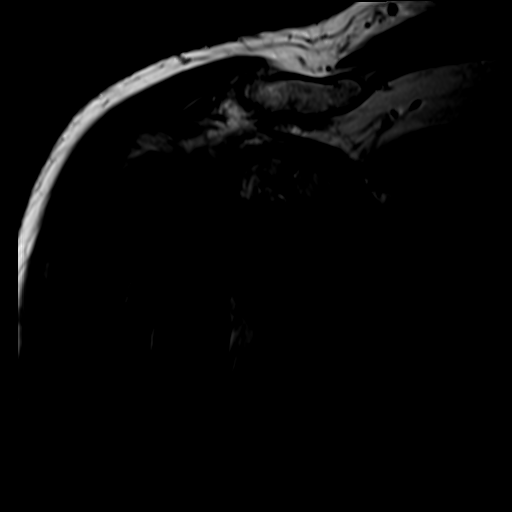
[im 11/21]
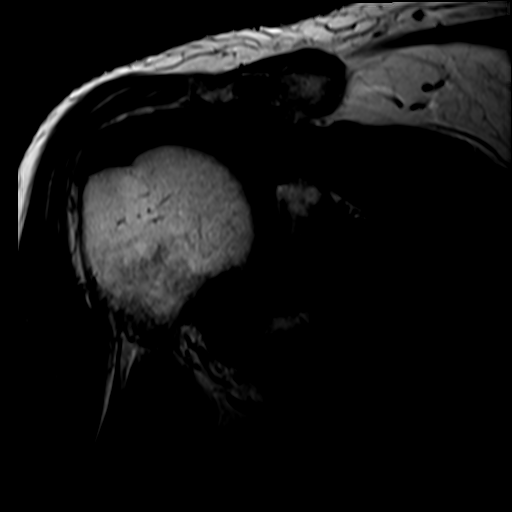
[im 14/21]
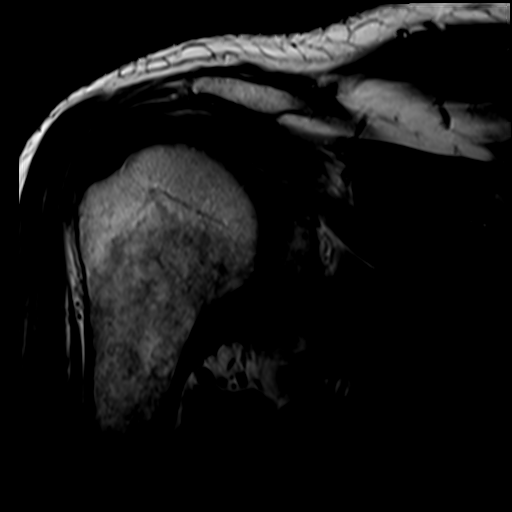
[im 17/21]
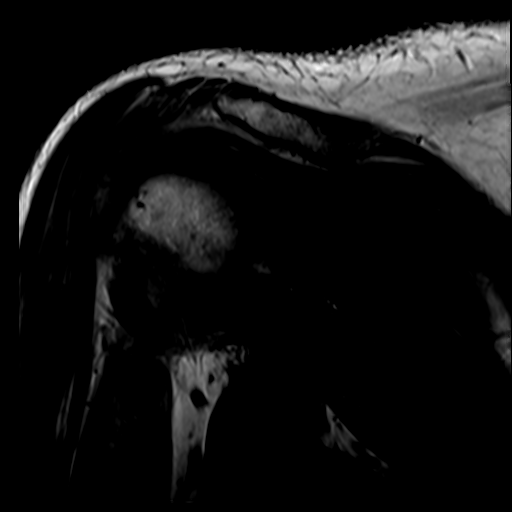
[im 21/21]
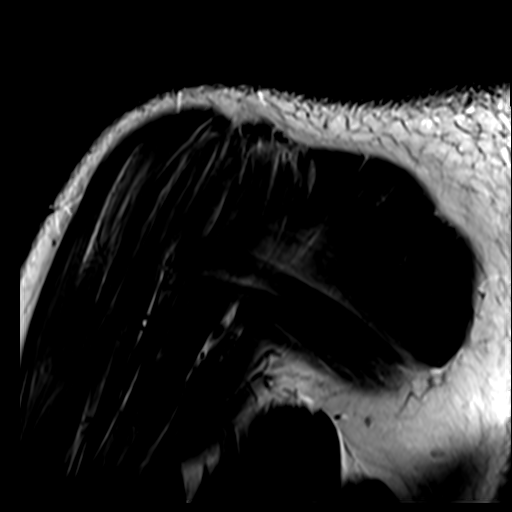

[Series 11: T2 fat-sat · axial · right · 3.0mm · 0.59mm/px · z∈[-104,-13]mm · 7 of 30 slices shown (3 of 3)]
[im 1/30]
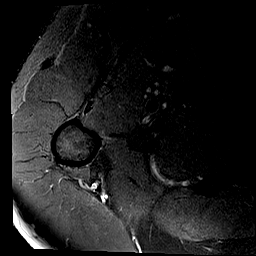
[im 4/30]
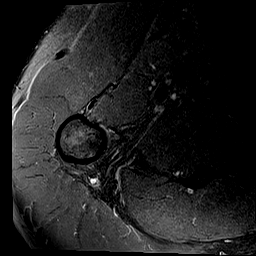
[im 10/30]
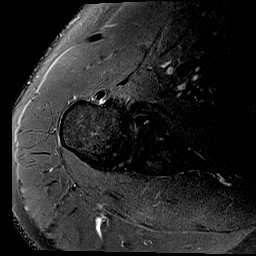
[im 13/30]
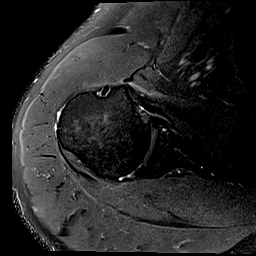
[im 17/30]
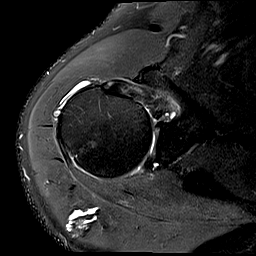
[im 20/30]
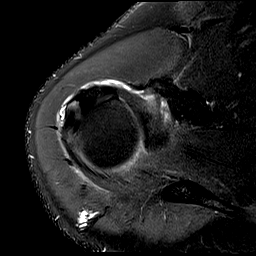
[im 26/30]
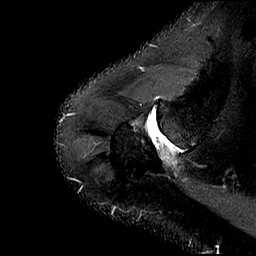

[29 of 40 positions shown; findings below may reference images not displayed]

FINDINGS: Rotator cuff: There is moderate intermediate T2 signal and
thickening tendinosis of the anterior greater than mid AP dimension
of the infraspinatus tendon. There is trace fluid bright signal
within the proximal and distal aspects of the anterior infraspinatus
tendon suggesting small partial-thickness midsubstance tears
(sagittal images 11 and 12 and 17 and 18). The small anterior
infraspinatus tendon tears minimally extend through the bursal
tendon surface (coronal image 12). The supraspinatus is intact. Mild
superior subscapularis tendinosis. The teres minor is intact.

Muscles: No rotator cuff muscle atrophy, fatty infiltration, or
edema.

Biceps long head: The intra-articular long head of the biceps tendon
is intact.

Acromioclavicular Joint: There are moderate degenerative changes of
the acromioclavicular joint including joint space narrowing,
subchondral marrow edema, and peripheral osteophytosis. Type 3
acromion with minimal downsloping of the anterolateral acromion.
Mild subacromial/subdeltoid bursitis.

Glenohumeral Joint: Mild-to-moderate thinning of the glenoid and
humeral head cartilage.

Labrum: a there is linear fluid bright signal indicating a small
nondisplaced tear of the posteroinferior glenoid labrum (axial
images 19 and 20).

Bones: Mild to moderate anterior glenoid degenerative subchondral
cystic change.

Other: None.
IMPRESSION: 1. Moderate anterior greater than mid AP dimension of the
infraspinatus tendinosis. Small partial-thickness midsubstance tears
of the anterior infraspinatus tendon both proximally and distally.
The distal anterior infraspinatus tendon tear minimally extends
through the bursal tendon surface.
2. Mild superior subscapularis tendinosis.
3. Moderate degenerative changes of the acromioclavicular joint.
Type 3 acromion.
4. Mild subacromial/subdeltoid bursitis.
5. Mild-to-moderate glenohumeral cartilage degenerative changes with
posteroinferior glenoid labrum likely degenerative tear.

## 2023-10-04 ENCOUNTER — Ambulatory Visit: Attending: Sports Medicine | Admitting: Rehabilitation

## 2023-10-04 DIAGNOSIS — M5412 Radiculopathy, cervical region: Secondary | ICD-10-CM | POA: Insufficient documentation

## 2023-10-04 DIAGNOSIS — R2681 Unsteadiness on feet: Secondary | ICD-10-CM | POA: Insufficient documentation

## 2023-10-04 DIAGNOSIS — M6281 Muscle weakness (generalized): Secondary | ICD-10-CM | POA: Insufficient documentation

## 2023-10-04 DIAGNOSIS — M5459 Other low back pain: Secondary | ICD-10-CM | POA: Diagnosis present

## 2023-10-04 DIAGNOSIS — R293 Abnormal posture: Secondary | ICD-10-CM | POA: Insufficient documentation

## 2023-10-04 DIAGNOSIS — R262 Difficulty in walking, not elsewhere classified: Secondary | ICD-10-CM | POA: Diagnosis present

## 2023-10-04 NOTE — Therapy (Signed)
 OUTPATIENT PHYSICAL THERAPY TREATMENT     Patient Name: Noah Lane MRN: [id number] DOB:[birth year], 68 y.o., male Today's Date: 10/04/2023  END OF SESSION:  PT End of Session - 10/04/23 0811     Visit Number 17    PT Start Time 0810    PT Stop Time 0850    PT Time Calculation (min) 40 min    Activity Tolerance No increased pain;Patient tolerated treatment well                 Past Medical History:  Diagnosis Date   Broken toe    Diabetes mellitus type 2 in obese    Mixed hyperlipidemia    Past Surgical History:  Procedure Laterality Date   COLON RESECTION N/A 04/27/2022   Procedure: LAPAROSCOPIC HAND ASSISTED RIGHT HEMI COLECTOMY, POSSIBLE OPEN;  Surgeon: Dasie Leonor CROME, MD;  Location: [facility];  Service: General;  Laterality: N/A;   COLONOSCOPY     HERNIA REPAIR  2012   Patient Active Problem List   Diagnosis Date Noted   Elevated blood pressure reading 05/04/2022   Colon adenocarcinoma (HCC) 05/04/2022   Gastroesophageal reflux disease 04/29/2022   Ileus, postoperative (HCC) 04/29/2022   Hypokalemia 04/28/2022   Hyperlipidemia 04/28/2022   Colonic obstruction (HCC) 04/25/2022   Hyponatremia 04/25/2022   OSA on CPAP 12/03/2021   Obesity (BMI 30.0-34.9) 12/03/2021   Mixed hyperlipidemia 10/20/2021   Strain of lumbar region 10/16/2021   Strain of calf muscle 07/22/2021   Patellofemoral pain syndrome of both knees 02/24/2021   Rotator cuff tendinitis, right 12/28/2019   Hip pain, bilateral 05/28/2019   Chronic bilateral low back pain without sciatica 05/28/2019   Type 2 diabetes mellitus with obesity (HCC) 05/28/2019   Erectile dysfunction 05/28/2019   Pain and swelling of left knee 03/12/2019   Lateral epicondylitis, right elbow 03/15/2017   Diverticulosis of large intestine without hemorrhage 02/25/2014    PCP: Frann Mabel Mt, DO   REFERRING PROVIDER: Arvell Evalene SAUNDERS, DO   REFERRING DIAG: [phone] (ICD-10-CM) - Degeneration of  intervertebral disc of lumbar region, unspecified whether pain present   THERAPY DIAG:  Other low back pain  Radiculopathy, cervical region  Muscle weakness (generalized)  Abnormal posture  RATIONALE FOR EVALUATION AND TREATMENT: Rehabilitation  ONSET DATE: early April 2025  NEXT MD VISIT: None scheduled with referring provider   SUBJECTIVE:  SUBJECTIVE STATEMENT: States feels ok today.  He is going to obtain some more flexible shaft golf clubs.   He rates LBP 3/10 today and states it is mostly centralized.    EVAL:  Pt reports he started getting LBP, mostly on R side, ~2 months ago w/o known MOI.  Pain first noted while rolling over in bed.  Pain comes in strikes w/o predictable trigger.  Pain is momentary lasting a few minutes when present.  He also reports recent onset of L UE numbness and tingling that he says Dr. Frann recommended he mention to PT.  UE radiculopathy also unpredictable in trigger and varies in duration.  He notes constant stiffness in neck, but denies pain.  He also reports balance issues, esp initially on standing, which he attributes to a weak core and his neuropathy.  PAIN: Are you having pain? No and Yes: NPRS scale: 0/10   Pain location: R low back  Pain description: exercise soreness  Aggravating factors: unpredictable  Relieving factors: remain still until pain resolves   Are you having pain? Yes: NPRS scale: comes and goes at all types of intensities  Pain location: L UE down to radial side of hand, occasional in thumb  Pain description: vibration (reminds him of a TENS unit), stiffness in neck  Aggravating factors: unpredictable  Relieving factors: heating pads, stretches   PERTINENT HISTORY:  DM-II, obesity, elevated BP, GERD, B patellofemoral  pain syndrome, B hip pain, h/o R RTC tendinitis, chronic B LBP w/o sciatica, scoliosis, diabetic peripheral neuropathy  PRECAUTIONS: None  RED FLAGS: None  WEIGHT BEARING RESTRICTIONS: No  FALLS:  Has patient fallen in last 6 months? 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 I would like the shoulder thing to go away. Better balance.   OBJECTIVE: (objective measures completed at initial evaluation unless otherwise dated)  DIAGNOSTIC FINDINGS:  10/16/21 - DG lumbar spine IMPRESSION: No acute fracture or subluxation of the lumbar spine.  PATIENT SURVEYS:  Modified Oswestry 18 / 50 = 36.0 %, moderate disability   Quick Dash 29.5 / 100 = 29.5 %  COGNITION:  Overall cognitive status: History of cognitive impairments - at baseline (mostly STM memory recall)    SENSATION: WFL B peripheral neuropathy in feet  POSTURE:  rounded shoulders, forward head, decreased lumbar lordosis, increased thoracic kyphosis, and flexed trunk   PALPATION: Increased muscle tension in L>R UT and LS as well as R>L lumbar paraspinals and glutes/piriformis  CERVICAL ROM:   Active ROM Eval 09/09/23  Flexion 49 52  Extension 42 48  Right lateral flexion 24 32  Left lateral flexion 16 24  Right rotation 51 61  Left rotation 52 58   (Blank rows = not tested)  UPPER EXTREMITY MMT:  MMT Right eval Left eval R 09/09/23 L 09/09/23 R 09/23/23 L 09/23/23  Shoulder flexion 4+ 4- 5 4+ 5 5  Shoulder extension  4+ 4+ 5 5 5 5   Shoulder abduction 4+ 4 5 5 5  5  Shoulder adduction        Shoulder internal rotation 4+ 4+ 5 5 5 5   Shoulder external rotation 4 4 4+ 4+ 5 5  Middle trapezius 4- 4- 4+ 4+ 4+ 4+  Lower trapezius 3+ 3+ 4 4 4 4   Grip strength         (Blank rows = not tested)  LUMBAR ROM:   Active  Eval 09/09/23  Flexion Hands to mid shins Fingertips to toes  Extension 25% limited WFL  Right lateral flexion Hand to femoral condyle - tight Hand to femoral condyle - heavy tightness  Left lateral flexion Hand to lateral knee - twinge in R low back Hand to femoral condyle - heavy tightness  Right rotation [facility] WFL  Left rotation [facility] WFL  (Blank rows = not tested)  MUSCLE LENGTH: Hamstrings: mod/severe tight L>R ITB: mod/severe tight B Piriformis: mild/mod tight R>L Hip flexors: mod tight B Quads: mod tight R>L  LOWER EXTREMITY ROM:    Grossly WFL  LOWER EXTREMITY MMT:    MMT Right eval Left eval R 09/09/23 L 09/09/23  Hip flexion 4 4 4+ 4+  Hip extension 4- 4- 4+ 4+  Hip abduction 4- 4- 4 4+  Hip adduction 4- 3+ 4+ 4  Hip internal rotation 5 4+ 5 (limited ROM) 5 (limited ROM)  Hip external rotation 4- 3+ 4- 4 (limited ROM)  Knee flexion 5 4+ 5 5  Knee extension 5 5 5 5   Ankle dorsiflexion 4+ 4+ 5 5  Ankle plantarflexion      Ankle inversion      Ankle eversion       (Blank rows = not tested)  LUMBAR SPECIAL TESTS:  Straight leg raise test: Negative and Slump test: Negative  FUNCTIONAL TESTS: (08/10/23) 5 times sit to stand: 15.75 sec Functional gait assessment: 22/30; indicated medium risk for falls Berg balance scale: 50/56; indicates moderate (>50%) risk for falls    09/09/23: FGA = 24/30, indicative of medium fall risk   Scores as of D/C from PT in July 2024: Berg: 54/56 FGA: 28/30   TODAY'S TREATMENT:  10/04/23 THERAPEUTIC EXERCISE: To improve strength and endurance.  Demonstration, verbal and tactile cues throughout for technique. Elliptical 1.5 x 7  min  NEUROMUSCULAR RE-EDUCATION: To improve balance and posture. Tandem balance w/ BUE side slides RTB x 20 L; x 20 R RDL 15# x 30 Standing R trunk rotation w/ RTB BUE x 30 Kneeling D2 chop x 30 R; x 30 L Kneeling R trunk rotatoin RTB x 30 Standing blue plyoball trunk rotation/throw/catches to R x 50  THERAPEUTIC ACTIVITIES: To improve functional performance.  Demonstration, verbal and tactile cues throughout for technique. Low row from golf stance blue TB x 30 BUE Seated rowing:  35# close grip x 15; wide grip x 15 BUE  09/29/23 THERAPEUTIC EXERCISE: To improve strength and endurance.  Demonstration, verbal and tactile cues throughout for technique. Elliptical 1.5 x 6 min NEUROMUSCULAR RE-EDUCATION: To improve balance and posture. Standing pallof press in semi tandem stance x 20 GTB Standing shoulder ext on airex pad x 20 GTB Standing shoulder ext + march GTB x 20 Standing toe taps 3 way from airex x 10 B Standing cone knock downs and pick ups x 10 B Standing modified SLS with rotations   09/27/23 THERAPEUTIC EXERCISE: To improve strength and endurance.  Demonstration, verbal and tactile cues throughout for technique.  NuStep L5 x 5' Elliptical- L1 x 2' F/2'B min   THERAPEUTIC ACTIVITIES:  To improve functional performance.  Demonstration, verbal and tactile cues throughout for technique. Low row from manually corrected golf stance position Blue TB x 20 BUE  NEUROMUSCULAR RE-EDUCATION: To improve balance and posture. RTB BUE resisted backswing x 2/10 RTB BUE trunk stabilization w/ side lift/slide BUE (shlr ABD RUE, ADD LUE) resisted chop/downswing blue TB x 2/10 BUE MFI walkouts x 5 laps 1/2 kneeling chop Blue TB x 10 BUE Tall kneeling resisted back swing YTB x 2/10 BUE  09/23/23 THERAPEUTIC EXERCISE: To improve strength and endurance.  Demonstration, verbal and tactile cues throughout for technique.  Elliptical- L1x26min  NEUROMUSCULAR RE-EDUCATION: To improve balance,  coordination, kinesthesia, posture, and proprioception. Fwd lunges 2 x 10 R/L Lateral lunges 2 x 10 R/L Pallof press GTB x 20 each side in semi tandem Shld ext with rotation GTB x 20 one arm at a time Standing oscillations in shld ext GTB fwd x 10 B; side to side x 10 B  09/21/23 THERAPEUTIC EXERCISE: To improve strength and endurance.  Demonstration, verbal and tactile cues throughout for technique.  Elliptical- L1x58min  Open Book Thoacolumbar Stretch w/ top knee bent x 10 B- 3-5 Sustained bridge w/ GTB + hip ABD 2x10  Supine Hip Add w/ ball squeeze 2x10 THERAPEUTIC ACTIVITIES: To improve functional performance.  Demonstration, verbal and tactile cues throughout for technique. Standing Chop w/ GTB 2x10 B Standing Lift w/ full trunk rotation w/ GTB 2x10  NEUROMUSCULAR RE-EDUCATION: To improve balance, coordination, kinesthesia, posture, and proprioception. Stationary Lunges x10 B- pt has small LOB w/ L foot fwd    09/14/23 NEUROMUSCULAR RE-EDUCATION: To improve balance, coordination, kinesthesia, posture, and proprioception. Functional squat + OH raise x 10, with 3# in B hands x 10 Multifidi walkout with GTB resistance x 10 B - cues to keep scapular and core muscles engaged Standing hip hinge with pole along spine 2 x 10 Sustained bridge + GTB clam/hip ABD 2 x 10  THERAPEUTIC ACTIVITIES: To improve functional performance.  Demonstration, verbal and tactile cues throughout for technique. Standing lift and chop with GTB 2 x 10 B - 1st sets with focus on UE diagonal with stable core, 2nd sets adding full trunk rotation (working toward core activation with mechanics of golf swing)  THERAPEUTIC EXERCISE: To improve strength and endurance.  Demonstration, verbal and tactile cues throughout for technique.  S/L GTB clam 2 x 10   09/12/23 THERAPEUTIC EXERCISE: To improve strength and endurance.   Elliptical - L1.0 x 5 min  NEUROMUSCULAR RE-EDUCATION: To improve balance, coordination,  kinesthesia, posture, proprioception, and reduce fall risk.  Tandem stance 2 x 30 with each foot forward - intermittent UE support on counter  Tandem gait: fwd 4 x 10 ft, backward 2 x 10 ft - intermittent UE support on counter  Staggered stance progressing to tandem stance + GTB pallof press x 10 bil with each foot fwd Corner balance progression with tandem stance on firm surface with arms at sides            Eyes open: static stance x 30 sec horiz head turns x 5 vertical head nods x 5 trunk rotation x 5 Eyes closed: static stance x 15-20 sec   09/09/23 THERAPEUTIC EXERCISE: To improve strength and endurance.  Demonstration, verbal and tactile cues throughout for technique.  NuStep - L6 x 8 min  PHYSICAL PERFORMANCE TEST or MEASUREMENT: Modified Oswestry: 20 / 50 = 40.0 % Functional Gait  Assessment  Gait Level Surface Walks 20 ft in less than  5.5 sec, no assistive devices, good speed, no evidence for imbalance, normal gait pattern, deviates no more than 6 in outside of the 12 in walkway width.   Change in Gait Speed Able to smoothly change walking speed without loss of balance or gait deviation. Deviate no more than 6 in outside of the 12 in walkway width.   Gait with Horizontal Head Turns Performs head turns smoothly with no change in gait. Deviates no more than 6 in outside 12 in walkway width   Gait with Vertical Head Turns Performs head turns with no change in gait. Deviates no more than 6 in outside 12 in walkway width.   Gait and Pivot Turn Pivot turns safely within 3 sec and stops quickly with no loss of balance.   Step Over Obstacle Is able to step over one shoe box (4.5 in total height) without changing gait speed. No evidence of imbalance.   Gait with Narrow Base of Support Ambulates 4-7 steps.   Gait with Eyes Closed Walks 20 ft, uses assistive device, slower speed, mild gait deviations, deviates 6-10 in outside 12 in walkway width. Ambulates 20 ft in less than 9 sec but  greater than 7 sec.   Ambulating Backwards Walks 20 ft, uses assistive device, slower speed, mild gait deviations, deviates 6-10 in outside 12 in walkway width.   Steps Alternating feet, must use rail.   Total Score 24   FGA comment: 19-24 = medium risk fall      THERAPEUTIC ACTIVITIES: To improve functional performance.  Demonstration, verbal and tactile cues throughout for technique. Cervical & lumbar ROM assessment UE/LE MMT Goal assessment  SELF CARE:  Review of progress with PT, status of goals, and plans for ongoing PT POC requesting patient input and feedback to ensure PT is addressing all of his concerns.   09/07/23 NEUROMUSCULAR RE-EDUCATION: To improve coordination, kinesthesia and proprioception.  Quadruped cat/cow x 10 Quadruped fire hydrants x 10 B- using hand grips Bird dog x 10 B Ab sets orange pball 10x5 LE pedals + TRA 2x15  THERAPEUTIC EXERCISE: To improve strength, endurance, ROM, and flexibility.  Seated KTOS piriformis stretch x 30 B  THERAPEUTIC ACTIVITIES: To improve functional performance.  Standing trunk rotation green TB x 15 B Standing pallof press GTB x 20 in semi tandem Standing chops GTB x 20 in semi tandem   08/31/2023 THERAPEUTIC EXERCISE: To improve strength and endurance.  Demonstration, verbal and tactile cues throughout for technique.  NuStep - L6 x 8 min  NEUROMUSCULAR RE-EDUCATION: To improve coordination, kinesthesia, posture, and proprioception. Quadruped thread and reach 2 x 10 bil Quadruped hip ABD & ER 2 x 10 bil, yoga block balanced on low back to promote neutral spine Prone from knees over green Pball - cervical retraction, thoracic extension + I's & T's x 10 each, cues for neutral cervical spine alignment avoiding flexion or extension   PATIENT EDUCATION:  Education details: progress with PT and ongoing PT POC  Person educated: Patient Education method: Explanation Education comprehension: verbalized understanding  HOME  EXERCISE PROGRAM: *Pt has MedBridgeGO access but also prefers printed handouts.  Access Code: [email or web address] URL: [email or web address] Date: 09/14/2023 Prepared by: Elijah Hidden  Exercises - Supine Hamstring Stretch with Strap  - 1-2 x daily - 7 x weekly - 3 reps - 30 sec hold - Supine Iliotibial Band Stretch with Strap  - 1-2 x daily - 7 x weekly - 3 reps - 30 sec hold - Prone Hip Flexor & Quad Stretch  with Strap  - 1-2 x daily - 7 x weekly - 3 reps - 30 sec hold - Hip Flexor Stretch with Strap on Table  - 1-2 x daily - 7 x weekly - 3 reps - 30 sec hold - Supine Gluteus Stretch  - 1-2 x daily - 7 x weekly - 3 reps - 30 sec hold - Supine Piriformis Stretch with Foot on Ground  - 1-2 x daily - 7 x weekly - 3 reps - 30 sec hold - Supine Lower Trunk Rotation  - 1-2 x daily - 7 x weekly - 2 sets - 5 reps - 10 sec hold - Supine Hip Adduction Isometric with Ball  - 1-2 x daily - 7 x weekly - 2 sets - 10 reps - 5 sec hold - Doorway Pec Stretch at 90 Degrees Abduction  - 1-2 x daily - 7 x weekly - 3 reps - 30 sec hold - Seated Scapular Retraction with External Rotation  - 1-2 x daily - 7 x weekly - 2 sets - 10 reps - 3-5 sec hold - Hooklying Single Leg Bent Knee Fallouts with Resistance  - 1 x daily - 3 x weekly - 2 sets - 10 reps - 3 sec hold - Supine Shoulder Horizontal Abduction with Resistance  - 1 x daily - 3 x weekly - 2 sets - 10 reps - 3 sec hold - Standing Shoulder Row with Anchored Resistance  - 1 x daily - 3 x weekly - 2 sets - 10 reps - 5 sec hold - Scapular Retraction with Resistance Advanced  - 1 x daily - 3 x weekly - 2 sets - 10 reps - 5 sec hold - Standing Shoulder Horizontal Abduction with Resistance  - 1 x daily - 3 x weekly - 2 sets - 10 reps - 3 sec hold - Shoulder External Rotation and Scapular Retraction with Resistance  - 1 x daily - 3 x weekly - 2 sets - 10 reps - 3-5 sec hold - Standing Anti-Rotation Press with Anchored Resistance  - 1 x daily - 3 x weekly - 2 sets  - 10 reps - 3 sec hold - Standing Hip Flexion with Anchored Resistance and Chair Support  - 1 x daily - 3 x weekly - 2 sets - 10 reps - 3 sec hold - Standing Hip Adduction with Resistance  - 1 x daily - 3 x weekly - 2 sets - 10 reps - 3 sec hold - Standing Hip Extension with Resistance  - 1 x daily - 3 x weekly - 2 sets - 10 reps - 3 sec hold - Standing Hip Abduction with Resistance  - 1 x daily - 3 x weekly - 2 sets - 10 reps - 3 sec hold - Seated Hamstring Curl with Anchored Resistance  - 1 x daily - 3 x weekly - 2 sets - 10 reps - 3-5 sec hold - Sitting Knee Extension with Resistance  - 1 x daily - 3 x weekly - 2 sets - 10 reps - 3-5 sec hold - Reverse Chop with Resistance  - 1 x daily - 3 x weekly - 2 sets - 10 reps - Standing Trunk Rotation with Resistance  - 1 x daily - 3 x weekly - 2 sets - 10 reps - Sidelying Thoracic Rotation with Open Book  - 1 x daily - 3 x weekly - 2 sets - 10 reps - Quadruped With Rotation: Thread The Needle  - 1  x daily - 3 x weekly - 2 sets - 10 reps - 3 sec hold - Quadruped Hip Abduction and External Rotation  - 1 x daily - 3 x weekly - 2 sets - 10 reps - 3 sec hold - Prone Shoulder Extension on Swiss Ball  - 1 x daily - 3 x weekly - 2 sets - 10 reps - 3 sec hold - Prone Middle Trapezius Strengthening on Swiss Ball  - 1 x daily - 3 x weekly - 2 sets - 10 reps - 3 sec hold - Anti-Rotation Sidestepping with Resistance  - 1 x daily - 3 x weekly - 2 sets - 10 reps - 3 sec hold - Clamshell with Resistance  - 1 x daily - 3 x weekly - 2 sets - 10 reps - 3-5 sec hold - Bridge with Hip Abduction and Resistance  - 1 x daily - 3 x weekly - 2 sets - 10 reps - 5 sec hold - Standing Hip Hinge with Dowel  - 1 x daily - 3 x weekly - 2 sets - 10 reps - 3 sec hold   ASSESSMENT:  CLINICAL IMPRESSION: Continued work on higher level balance to help with golf swinging. Pt still showing some difficulty with single leg stance. CGA given throughout session.    EVAL: Noah Lane  is a 68 y.o. male who was referred to physical therapy for evaluation and treatment for R sided LBP 2 lumbar DDD.  Patient also c/o intermittent L UE radiculopathy (vibration sensation).  Patient reports onset of R sided LBP pain beginning ~2 months ago w/o know MOI.  Triggers for LBP and R UE radiculopathy are unpredictable.  Patient has deficits in cervical and lumbar ROM, proximal B LE flexibility, L>R UE/scapular strength, B LE strength, core/postural weakness with abnormal posture, and abnormal muscle tension which are interfering with ADLs and are impacting quality of life.  On Modified Oswestry patient scored 18/50 demonstrating 36% or moderate disability.  On QuickDASH patient scored 29.5/100 demonstrating 29.5% disability.  He also notes impaired balance which will be formally assessed next visit.  Esaul will benefit from skilled PT to address above deficits to improve mobility and activity tolerance with decreased pain interference.     OBJECTIVE IMPAIRMENTS: decreased activity tolerance, decreased balance, decreased knowledge of condition, decreased mobility, difficulty walking, decreased ROM, decreased strength, increased fascial restrictions, impaired perceived functional ability, increased muscle spasms, impaired flexibility, impaired sensation, impaired UE functional use, improper body mechanics, postural dysfunction, and pain.   ACTIVITY LIMITATIONS: carrying, lifting, bending, sitting, standing, squatting, sleeping, stairs, transfers, bed mobility, reach over head, locomotion level, and caring for others  PARTICIPATION LIMITATIONS: meal prep, cleaning, laundry, community activity, and yard work  PERSONAL FACTORS: Age, Fitness, Past/current experiences, Time since onset of injury/illness/exacerbation, and 3+ comorbidities: DM-II, obesity, elevated BP, GERD, B patellofemoral pain syndrome, B hip pain, h/o R RTC tendinitis, chronic B LBP w/o sciatica, scoliosis, diabetic peripheral neuropathy  are also affecting patient's functional outcome.   REHAB POTENTIAL: Good  CLINICAL DECISION MAKING: Evolving/moderate complexity  EVALUATION COMPLEXITY: Moderate   GOALS: Goals reviewed with patient? Yes  SHORT TERM GOALS: Target date: 09/07/2023  Patient will be independent with initial HEP to improve outcomes and carryover.  Baseline:  08/08/23 - reviewed today Goal status: MET - 08/17/23  2.  Patient will report 25% improvement in low back pain to improve QOL. Baseline: 0/10 on eval, up to at least 7/10  08/17/23 - Twinges remain unpredictable but very  short-lived Goal status: MET - 08/31/23 - pt reports overall reduction in pain frequency and intensity by 50%   3.  Patient will report 25% reduction in frequency and intensity of L UE radiculopathy to improve QOL. Baseline: 3-5/10 on eval, up to 8-9/10 08/17/23 - still come and goes  Goal status: IN PROGRESS - 09/23/23 - buzzing essentially unchanged - still come and goes   LONG TERM GOALS: Target date: 10/12/2023  Patient will be independent with ongoing/advanced HEP for self-management at home.  Baseline:  Goal status: MET - 08/31/23 - met for current HEP, anticipate further updates;  10/04/23  MET  2.  Patient will report 50-75% improvement in low back pain to improve QOL.  Baseline: 0/10 on eval, up to at least 7/10 Goal status: MET - 09/29/23 - pt reports overall reduction in pain frequency and intensity by 75%   3.  Patient will report 50-75% reduction in frequency and intensity of L UE radiculopathy to improve QOL. Baseline: 3-5/10 on eval, up to 8-9/10 Goal status: IN PROGRESS - 08/31/23 - buzzing essentially unchanged - still come and goes   4.  Patient to demonstrate ability to achieve and maintain good spinal alignment/posturing and body mechanics needed for daily activities. Baseline: rounded shoulders, forward head, decreased lumbar lordosis, increased thoracic kyphosis, and flexed trunk posture Goal status: MET -  09/09/23 - improving postural awareness but continued intermittent cues necessary;  8/5:  able to maintain scapular retraction in golf stance  5.  Patient will demonstrate functional pain free cervical and lumbar ROM to perform ADLs.   Baseline: Refer to above cervical and lumbar ROM table Goal status: IN PROGRESS - 09/09/23 - improving cervical and lumbar ROM with remaining restriction more due to tightness than pain  6.  Patient will demonstrate improved scapular, B shoulder and B LE strength to >/= 4+/5 for improved stability and ease of mobility. Baseline: Refer to above UE & LE MMT tables Goal status: IN PROGRESS - 09/09/23 - met except B lower trap for UE and Hip ABD/ADD and ER for LE  7. Patient will report </= 24% on Modified Oswestry (MCID = 12%) to demonstrate improved functional ability with decreased pain interference. Baseline: 18 / 50 = 36.0 % Goal status: MET - 09/09/23 - 20 / 50 = 40.0 %;   8/5:  9/50 = 18%  8.  Patient will report </= 16% on QuickDASH to demonstrate improved functional ability with decreased L UE radiculopathy interference. Baseline: 29.5 / 100 = 29.5 % Goal status: IN PROGRESS - 09/07/23 - 27.3/100   9.  Patient will improve FGA score by at least 4 points or to >/= 28/30 (baseline as of prior D/C from PT) to improve gait stability and reduce risk for falls.  Baseline: 22/30 - 08/10/23 Goal status: IN PROGRESS - 09/09/23 - 24/30   PLAN:  PT FREQUENCY: 2x/week  PT DURATION: 8-10 weeks  PLANNED INTERVENTIONS: 02835- PT Re-evaluation, 97750- Physical Performance Testing, 97110-Therapeutic exercises, 97530- Therapeutic activity, 97112- Neuromuscular re-education, 97535- Self Care, 02859- Manual therapy, [id number]- Gait training, [phone]- Aquatic Therapy, [phone]- Electrical stimulation (unattended), [id number]- Ultrasound, [id number]- Traction (mechanical), [id number]- Ionotophoresis 4mg /ml Dexamethasone , 79439 (1-2 muscles), [id number] (3+ muscles)- Dry Needling, Patient/Family education,  Balance training, Stair training, Taping, Joint mobilization, Spinal mobilization, Cryotherapy, and Moist heat  PLAN FOR NEXT SESSION:  Review all HEP; Reassess strength and ROM; plan to d/c by end of certification period next week  Madysyn Hanken, PT 10/04/2023, 8:59 AM

## 2023-10-12 ENCOUNTER — Ambulatory Visit

## 2023-10-12 ENCOUNTER — Encounter: Payer: Self-pay | Admitting: Family Medicine

## 2023-10-12 DIAGNOSIS — M5459 Other low back pain: Secondary | ICD-10-CM

## 2023-10-12 DIAGNOSIS — R2681 Unsteadiness on feet: Secondary | ICD-10-CM

## 2023-10-12 DIAGNOSIS — R293 Abnormal posture: Secondary | ICD-10-CM

## 2023-10-12 DIAGNOSIS — R262 Difficulty in walking, not elsewhere classified: Secondary | ICD-10-CM

## 2023-10-12 DIAGNOSIS — M6281 Muscle weakness (generalized): Secondary | ICD-10-CM

## 2023-10-12 DIAGNOSIS — M5412 Radiculopathy, cervical region: Secondary | ICD-10-CM

## 2023-10-12 NOTE — Therapy (Addendum)
 OUTPATIENT PHYSICAL THERAPY TREATMENT / DC SUMMARY     Patient Name: Noah Lane MRN: [id number] DOB:09-04-1955, 68 y.o., male Today's Date: 10/12/2023  END OF SESSION:  PT End of Session - 10/12/23 0835     Visit Number 18    Authorization Type Medicare & BCBS    Progress Note Due on Visit 20    PT Start Time 0803    PT Stop Time 0834    PT Time Calculation (min) 31 min    Activity Tolerance No increased pain;Patient tolerated treatment well    Behavior During Therapy WFL for tasks assessed/performed                  Past Medical History:  Diagnosis Date   Broken toe    Diabetes mellitus type 2 in obese    Mixed hyperlipidemia    Past Surgical History:  Procedure Laterality Date   COLON RESECTION N/A 04/27/2022   Procedure: LAPAROSCOPIC HAND ASSISTED RIGHT HEMI COLECTOMY, POSSIBLE OPEN;  Surgeon: Dasie Leonor CROME, MD;  Location: [facility];  Service: General;  Laterality: N/A;   COLONOSCOPY     HERNIA REPAIR  2012   Patient Active Problem List   Diagnosis Date Noted   Elevated blood pressure reading 05/04/2022   Colon adenocarcinoma (HCC) 05/04/2022   Gastroesophageal reflux disease 04/29/2022   Ileus, postoperative (HCC) 04/29/2022   Hypokalemia 04/28/2022   Hyperlipidemia 04/28/2022   Colonic obstruction (HCC) 04/25/2022   Hyponatremia 04/25/2022   OSA on CPAP 12/03/2021   Obesity (BMI 30.0-34.9) 12/03/2021   Mixed hyperlipidemia 10/20/2021   Strain of lumbar region 10/16/2021   Strain of calf muscle 07/22/2021   Patellofemoral pain syndrome of both knees 02/24/2021   Rotator cuff tendinitis, right 12/28/2019   Hip pain, bilateral 05/28/2019   Chronic bilateral low back pain without sciatica 05/28/2019   Type 2 diabetes mellitus with obesity (HCC) 05/28/2019   Erectile dysfunction 05/28/2019   Pain and swelling of left knee 03/12/2019   Lateral epicondylitis, right elbow 03/15/2017   Diverticulosis of large intestine without hemorrhage 02/25/2014     PCP: Frann Mabel Mt, DO   REFERRING PROVIDER: Arvell Evalene SAUNDERS, DO   REFERRING DIAG: [phone] (ICD-10-CM) - Degeneration of intervertebral disc of lumbar region, unspecified whether pain present   THERAPY DIAG:  Other low back pain  Radiculopathy, cervical region  Muscle weakness (generalized)  Abnormal posture  Unsteadiness on feet  Difficulty in walking, not elsewhere classified  RATIONALE FOR EVALUATION AND TREATMENT: Rehabilitation  ONSET DATE: early April 2025  NEXT MD VISIT: None scheduled with referring provider   SUBJECTIVE:  SUBJECTIVE STATEMENT: States feels ok today.  He is going to obtain some more flexible shaft golf clubs.   He rates LBP 3/10 today and states it is mostly centralized.    EVAL:  Pt reports he started getting LBP, mostly on R side, ~2 months ago w/o known MOI.  Pain first noted while rolling over in bed.  Pain comes in strikes w/o predictable trigger.  Pain is momentary lasting a few minutes when present.  He also reports recent onset of L UE numbness and tingling that he says Dr. Frann recommended he mention to PT.  UE radiculopathy also unpredictable in trigger and varies in duration.  He notes constant stiffness in neck, but denies pain.  He also reports balance issues, esp initially on standing, which he attributes to a weak core and his neuropathy.  PAIN: Are you having pain? No and Yes: NPRS scale: 0/10   Pain location: R low back  Pain description: exercise soreness  Aggravating factors: unpredictable  Relieving factors: remain still until pain resolves   Are you having pain? Yes: NPRS scale: comes and goes at all types of intensities  Pain location: L UE down to radial side of hand, occasional in thumb  Pain description:  vibration (reminds him of a TENS unit), stiffness in neck  Aggravating factors: unpredictable  Relieving factors: heating pads, stretches   PERTINENT HISTORY:  DM-II, obesity, elevated BP, GERD, B patellofemoral pain syndrome, B hip pain, h/o R RTC tendinitis, chronic B LBP w/o sciatica, scoliosis, diabetic peripheral neuropathy  PRECAUTIONS: None  RED FLAGS: None  WEIGHT BEARING RESTRICTIONS: No  FALLS:  Has patient fallen in last 6 months? 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 I would like the shoulder thing to go away. Better balance.   OBJECTIVE: (objective measures completed at initial evaluation unless otherwise dated)  DIAGNOSTIC FINDINGS:  10/16/21 - DG lumbar spine IMPRESSION: No acute fracture or subluxation of the lumbar spine.  PATIENT SURVEYS:  Modified Oswestry 18 / 50 = 36.0 %, moderate disability   Quick Dash 29.5 / 100 = 29.5 %  COGNITION:  Overall cognitive status: History of cognitive impairments - at baseline (mostly STM memory recall)    SENSATION: WFL B peripheral neuropathy in feet  POSTURE:  rounded shoulders, forward head, decreased lumbar lordosis, increased thoracic kyphosis, and flexed trunk   PALPATION: Increased muscle tension in L>R UT and LS as well as R>L lumbar paraspinals and glutes/piriformis  CERVICAL ROM:  10/12/23- functional movement but pt reports tightness ROM Active ROM Eval 09/09/23  Flexion 49 52  Extension 42 48   Right lateral flexion 24 32  Left lateral flexion 16 24  Right rotation 51 61  Left rotation 52 58   (Blank rows = not tested)  UPPER EXTREMITY MMT:  MMT Right eval Left eval R 09/09/23 L 09/09/23 R 09/23/23 L 09/23/23 R/L 10/12/23  Shoulder flexion 4+ 4- 5 4+ 5 5   Shoulder extension 4+ 4+ 5 5 5  5   Shoulder abduction 4+ 4 5 5 5 5    Shoulder adduction         Shoulder internal rotation 4+ 4+ 5 5 5 5    Shoulder external rotation 4 4 4+ 4+ 5 5   Middle trapezius 4- 4- 4+ 4+ 4+ 4+   Lower trapezius 3+ 3+ 4 4 4 4  4+/4+  Grip strength          (Blank rows = not tested)  LUMBAR ROM:   Active  Eval 09/09/23 10/12/23  Flexion Hands to mid shins Fingertips to toes WFL  Extension 25% limited [facility] WFL  Right lateral flexion Hand to femoral condyle - tight Hand to femoral condyle - heavy tightness Hand to distal thigh  Left lateral flexion Hand to lateral knee - twinge in R low back Hand to femoral condyle - heavy tightness Hand to mid thigh  Right rotation [facility] WFL   Left rotation [facility] WFL   (Blank rows = not tested)  MUSCLE LENGTH: Hamstrings: mod/severe tight L>R ITB: mod/severe tight B Piriformis: mild/mod tight R>L Hip flexors: mod tight B Quads: mod tight R>L  LOWER EXTREMITY ROM:    Grossly WFL  LOWER EXTREMITY MMT:    MMT Right eval Left eval R 09/09/23 L 09/09/23 R/L 10/12/23  Hip flexion 4 4 4+ 4+ 4+/4+  Hip extension 4- 4- 4+ 4+ 4+/4+  Hip abduction 4- 4- 4 4+ 4+/4  Hip adduction 4- 3+ 4+ 4 4+/4  Hip internal rotation 5 4+ 5 (limited ROM) 5 (limited ROM) 5/5  Hip external rotation 4- 3+ 4- 4 (limited ROM) 4+/4  Knee flexion 5 4+ 5 5   Knee extension 5 5 5 5    Ankle dorsiflexion 4+ 4+ 5 5   Ankle plantarflexion       Ankle inversion       Ankle eversion        (Blank rows = not tested)  LUMBAR SPECIAL TESTS:  Straight leg raise test: Negative and Slump test: Negative  FUNCTIONAL TESTS: (08/10/23) 5 times sit to stand: 15.75 sec Functional gait assessment: 22/30;  indicated medium risk for falls Berg balance scale: 50/56; indicates moderate (>50%) risk for falls    09/09/23: FGA = 24/30, indicative of medium fall risk   Scores as of D/C from PT in July 2024: Berg: 54/56 FGA: 28/30   TODAY'S TREATMENT:  10/12/23 Reassessment of all goals  [facility] PT Assessment - 10/12/23 0001       Functional Gait  Assessment   Gait Level Surface Walks 20 ft in less than 5.5 sec, no assistive devices, good speed, no evidence for imbalance, normal gait pattern, deviates no more than 6 in outside of the 12 in walkway width.    Change in Gait Speed Able to smoothly change walking speed without loss of balance or gait deviation. Deviate no more than 6 in outside of the 12 in walkway width.    Gait with Horizontal Head Turns Performs head turns smoothly with no change in gait. Deviates no more than 6 in outside 12 in walkway width    Gait with Vertical Head Turns Performs head turns with no change in gait. Deviates no more than 6 in outside 12 in walkway width.    Gait and Pivot Turn Pivot turns safely within 3 sec and stops quickly with no loss of balance.    Step Over Obstacle Is able to step over 2 stacked shoe boxes taped together (9 in total height)  without changing gait speed. No evidence of imbalance.    Gait with Narrow Base of Support Ambulates 7-9 steps.    Gait with Eyes Closed Walks 20 ft, uses assistive device, slower speed, mild gait deviations, deviates 6-10 in outside 12 in walkway width. Ambulates 20 ft in less than 9 sec but greater than 7 sec.    Ambulating Backwards Walks 20 ft, no assistive devices, good speed, no evidence for imbalance, normal gait    Steps Alternating feet, no rail.    Total Score 28          10/04/23 THERAPEUTIC EXERCISE: To improve strength and endurance.  Demonstration, verbal and tactile cues throughout for technique. Elliptical 1.5 x 7 min  NEUROMUSCULAR RE-EDUCATION: To improve balance and posture. Tandem balance w/ BUE  side slides RTB x 20 L; x 20 R RDL 15# x 30 Standing R trunk rotation w/ RTB BUE x 30 Kneeling D2 chop x 30 R; x 30 L Kneeling R trunk rotatoin RTB x 30 Standing blue plyoball trunk rotation/throw/catches to R x 50  THERAPEUTIC ACTIVITIES: To improve functional performance.  Demonstration, verbal and tactile cues throughout for technique. Low row from golf stance blue TB x 30 BUE Seated rowing:  35# close grip x 15; wide grip x 15 BUE  PATIENT EDUCATION:  Education details: progress with PT and ongoing PT POC  Person educated: Patient Education method: Explanation Education comprehension: verbalized understanding  HOME EXERCISE PROGRAM: *Pt has MedBridgeGO access but also prefers printed handouts.  Access Code: [email or web address] URL: [email or web address] Date: 10/12/2023 Prepared by: Taylormarie Register  Exercises - Supine Hamstring Stretch with Strap  - 1-2 x daily - 7 x weekly - 3 reps - 30 sec hold - Supine Iliotibial Band Stretch with Strap  - 1-2 x daily - 7 x weekly - 3 reps - 30 sec hold - Prone Hip Flexor & Quad Stretch with Strap  - 1-2 x daily - 7 x weekly - 3 reps - 30 sec hold - Hip Flexor Stretch with Strap on Table  - 1-2 x daily - 7 x weekly - 3 reps - 30 sec hold - Supine Gluteus Stretch  - 1-2 x daily - 7 x weekly - 3 reps - 30 sec hold - Supine Piriformis Stretch with Foot on Ground  - 1-2 x daily - 7 x weekly - 3 reps - 30 sec hold - Supine Lower Trunk Rotation  - 1-2 x daily - 7 x weekly - 2 sets - 5 reps - 10 sec hold - Supine Hip Adduction Isometric with Ball  - 1-2 x daily - 7 x weekly - 2 sets - 10 reps - 5 sec hold - Doorway Pec Stretch at 90 Degrees Abduction  - 1-2 x daily - 7 x weekly - 3 reps - 30 sec hold - Seated Scapular Retraction with External Rotation  - 1-2 x daily - 7 x weekly - 2 sets - 10 reps - 3-5 sec hold - Hooklying Single Leg Bent Knee Fallouts with Resistance  - 1 x daily - 3 x weekly - 2 sets - 10 reps - 3 sec hold - Supine Shoulder  Horizontal Abduction with Resistance  - 1 x daily - 3 x weekly - 2 sets - 10 reps - 3 sec hold - Standing Shoulder Row with Anchored Resistance  - 1 x daily - 3 x weekly - 2 sets - 10 reps - 5 sec hold - Scapular Retraction with Resistance Advanced  -  1 x daily - 3 x weekly - 2 sets - 10 reps - 5 sec hold - Standing Shoulder Horizontal Abduction with Resistance  - 1 x daily - 3 x weekly - 2 sets - 10 reps - 3 sec hold - Shoulder External Rotation and Scapular Retraction with Resistance  - 1 x daily - 3 x weekly - 2 sets - 10 reps - 3-5 sec hold - Standing Anti-Rotation Press with Anchored Resistance  - 1 x daily - 3 x weekly - 2 sets - 10 reps - 3 sec hold - Standing Hip Flexion with Anchored Resistance and Chair Support  - 1 x daily - 3 x weekly - 2 sets - 10 reps - 3 sec hold - Standing Hip Adduction with Resistance  - 1 x daily - 3 x weekly - 2 sets - 10 reps - 3 sec hold - Standing Hip Extension with Resistance  - 1 x daily - 3 x weekly - 2 sets - 10 reps - 3 sec hold - Standing Hip Abduction with Resistance  - 1 x daily - 3 x weekly - 2 sets - 10 reps - 3 sec hold - Seated Hamstring Curl with Anchored Resistance  - 1 x daily - 3 x weekly - 2 sets - 10 reps - 3-5 sec hold - Sitting Knee Extension with Resistance  - 1 x daily - 3 x weekly - 2 sets - 10 reps - 3-5 sec hold - Reverse Chop with Resistance  - 1 x daily - 3 x weekly - 2 sets - 10 reps - Standing Trunk Rotation with Resistance  - 1 x daily - 3 x weekly - 2 sets - 10 reps - Sidelying Thoracic Rotation with Open Book  - 1 x daily - 3 x weekly - 2 sets - 10 reps - Quadruped With Rotation: Thread The Needle  - 1 x daily - 3 x weekly - 2 sets - 10 reps - 3 sec hold - Quadruped Hip Abduction and External Rotation  - 1 x daily - 3 x weekly - 2 sets - 10 reps - 3 sec hold - Prone Shoulder Extension on Swiss Ball  - 1 x daily - 3 x weekly - 2 sets - 10 reps - 3 sec hold - Prone Middle Trapezius Strengthening on Swiss Ball  - 1 x daily - 3 x  weekly - 2 sets - 10 reps - 3 sec hold - Anti-Rotation Sidestepping with Resistance  - 1 x daily - 3 x weekly - 2 sets - 10 reps - 3 sec hold - Clamshell with Resistance  - 1 x daily - 3 x weekly - 2 sets - 10 reps - 3-5 sec hold - Bridge with Hip Abduction and Resistance  - 1 x daily - 3 x weekly - 2 sets - 10 reps - 5 sec hold - Standing Hip Hinge with Dowel  - 1 x daily - 3 x weekly - 2 sets - 10 reps - 3 sec hold - Seated Upper Trapezius Stretch  - 2 x daily - 7 x weekly - 2 sets - 2 reps - 30 second hold - Gentle Levator Scapulae Stretch  - 2 x daily - 7 x weekly - 2 sets - 2 reps - 30 seconds hold   ASSESSMENT:  CLINICAL IMPRESSION: Pt has progressed well with PT. Functionally he is now practicing golf swings at a golf range, with only reports of soreness afterwards in low back. His  ROM is functional but he reports tightness when moving his neck and back. Most goals are met, only STG #3, LTG #3, #6, and #8 remain unmet. He will be discharged as of today.  EVAL: Joesiah Lonon is a 68 y.o. male who was referred to physical therapy for evaluation and treatment for R sided LBP 2 lumbar DDD.  Patient also c/o intermittent L UE radiculopathy (vibration sensation).  Patient reports onset of R sided LBP pain beginning ~2 months ago w/o know MOI.  Triggers for LBP and R UE radiculopathy are unpredictable.  Patient has deficits in cervical and lumbar ROM, proximal B LE flexibility, L>R UE/scapular strength, B LE strength, core/postural weakness with abnormal posture, and abnormal muscle tension which are interfering with ADLs and are impacting quality of life.  On Modified Oswestry patient scored 18/50 demonstrating 36% or moderate disability.  On QuickDASH patient scored 29.5/100 demonstrating 29.5% disability.  He also notes impaired balance which will be formally assessed next visit.  Dilan will benefit from skilled PT to address above deficits to improve mobility and activity tolerance with  decreased pain interference.     OBJECTIVE IMPAIRMENTS: decreased activity tolerance, decreased balance, decreased knowledge of condition, decreased mobility, difficulty walking, decreased ROM, decreased strength, increased fascial restrictions, impaired perceived functional ability, increased muscle spasms, impaired flexibility, impaired sensation, impaired UE functional use, improper body mechanics, postural dysfunction, and pain.   ACTIVITY LIMITATIONS: carrying, lifting, bending, sitting, standing, squatting, sleeping, stairs, transfers, bed mobility, reach over head, locomotion level, and caring for others  PARTICIPATION LIMITATIONS: meal prep, cleaning, laundry, community activity, and yard work  PERSONAL FACTORS: Age, Fitness, Past/current experiences, Time since onset of injury/illness/exacerbation, and 3+ comorbidities: DM-II, obesity, elevated BP, GERD, B patellofemoral pain syndrome, B hip pain, h/o R RTC tendinitis, chronic B LBP w/o sciatica, scoliosis, diabetic peripheral neuropathy are also affecting patient's functional outcome.   REHAB POTENTIAL: Good  CLINICAL DECISION MAKING: Evolving/moderate complexity  EVALUATION COMPLEXITY: Moderate   GOALS: Goals reviewed with patient? Yes  SHORT TERM GOALS: Target date: 09/07/2023  Patient will be independent with initial HEP to improve outcomes and carryover.  Baseline:  08/08/23 - reviewed today Goal status: MET - 08/17/23  2.  Patient will report 25% improvement in low back pain to improve QOL. Baseline: 0/10 on eval, up to at least 7/10  08/17/23 - Twinges remain unpredictable but very short-lived Goal status: MET - 08/31/23 - pt reports overall reduction in pain frequency and intensity by 50%   3.  Patient will report 25% reduction in frequency and intensity of L UE radiculopathy to improve QOL. Baseline: 3-5/10 on eval, up to 8-9/10 08/17/23 - still come and goes  Goal status: NOT MET - 10/12/23 - occasionally happens  essentially unchanged - still come and goes   LONG TERM GOALS: Target date: 10/12/2023  Patient will be independent with ongoing/advanced HEP for self-management at home.  Baseline:  Goal status: MET - 08/31/23 - met for current HEP, anticipate further updates;  10/04/23  MET  2.  Patient will report 50-75% improvement in low back pain to improve QOL.  Baseline: 0/10 on eval, up to at least 7/10 Goal status: MET - 09/29/23 - pt reports overall reduction in pain frequency and intensity by 75%   3.  Patient will report 50-75% reduction in frequency and intensity of L UE radiculopathy to improve QOL. Baseline: 3-5/10 on eval, up to 8-9/10 Goal status: NOT MET - 10/12/23 - occasionally happens essentially unchanged - still come  and goes    4.  Patient to demonstrate ability to achieve and maintain good spinal alignment/posturing and body mechanics needed for daily activities. Baseline: rounded shoulders, forward head, decreased lumbar lordosis, increased thoracic kyphosis, and flexed trunk posture Goal status: MET - 09/09/23 - improving postural awareness but continued intermittent cues necessary;  8/5:  able to maintain scapular retraction in golf stance  5.  Patient will demonstrate functional pain free cervical and lumbar ROM to perform ADLs.   Baseline: Refer to above cervical and lumbar ROM table Goal status: MET - 10/12/23 functional motion but tight in neck and back  6.  Patient will demonstrate improved scapular, B shoulder and B LE strength to >/= 4+/5 for improved stability and ease of mobility. Baseline: Refer to above UE & LE MMT tables Goal status: NOT MET - 10/12/23 refer to MMT chart  7. Patient will report </= 24% on Modified Oswestry (MCID = 12%) to demonstrate improved functional ability with decreased pain interference. Baseline: 18 / 50 = 36.0 % Goal status: MET - 09/09/23 - 20 / 50 = 40.0 %;   8/5:  9/50 = 18%  8.  Patient will report </= 16% on QuickDASH to demonstrate improved  functional ability with decreased L UE radiculopathy interference. Baseline: 29.5 / 100 = 29.5 % Goal status: NOT MET - 09/07/23 - 27.3/100; 18.2 / 100 = 18.2 %   9.  Patient will improve FGA score by at least 4 points or to >/= 28/30 (baseline as of prior D/C from PT) to improve gait stability and reduce risk for falls.  Baseline: 22/30 - 08/10/23 Goal status: MET - 09/09/23 - 24/30; 10/12/23 28/30   PLAN:  PT FREQUENCY: 2x/week  PT DURATION: 8-10 weeks  PLANNED INTERVENTIONS: 02835- PT Re-evaluation, 97750- Physical Performance Testing, 97110-Therapeutic exercises, 97530- Therapeutic activity, 97112- Neuromuscular re-education, 97535- Self Care, 02859- Manual therapy, [id number]- Gait training, [id number]- Aquatic Therapy, [phone]- Electrical stimulation (unattended), [id number]- Ultrasound, [id number]- Traction (mechanical), [id number]- Ionotophoresis 4mg /ml Dexamethasone , 79439 (1-2 muscles), 20561 (3+ muscles)- Dry Needling, Patient/Family education, Balance training, Stair training, Taping, Joint mobilization, Spinal mobilization, Cryotherapy, and Moist heat  PLAN FOR NEXT SESSION:  discharge  Sol LITTIE Gaskins, PTA 10/12/2023, 8:35 AM   PHYSICAL THERAPY DISCHARGE SUMMARY  Visits from Start of Care: 18  Current functional level related to goals / functional outcomes: Patient is playing golf, but with residual low back pain.  This will likely be something that he must continue to manage.  However, he has improved since his initial visit.   He reports his LBP is 75% improved   Remaining deficits: LUE radiculopathy is essentially unchanged per his report and it comes and goes without warning.  He may want to consider cervical injections if that is a possibility for him    Education / Equipment: Patient is independent with his HEP and is to continue with this daily as tolerated and call us  with any further questions   Patient agrees to discharge. Patient goals were partially met. Patient is being discharged due to  maximized rehab potential.   Garnette Montclair, PT 10/16/2023, 9:42 PM  [facility] [address]  [address] [place], [place], [address] Phone: [phone]   Fax:  [phone]

## 2023-10-18 ENCOUNTER — Encounter: Admitting: Rehabilitation

## 2023-10-20 ENCOUNTER — Encounter: Admitting: Rehabilitation

## 2023-10-25 ENCOUNTER — Encounter: Admitting: Rehabilitation

## 2023-10-27 ENCOUNTER — Encounter: Admitting: Rehabilitation

## 2023-11-01 ENCOUNTER — Encounter

## 2023-11-04 ENCOUNTER — Encounter: Payer: Self-pay | Admitting: Podiatry

## 2023-11-04 ENCOUNTER — Ambulatory Visit: Admitting: Podiatry

## 2023-11-04 DIAGNOSIS — E1142 Type 2 diabetes mellitus with diabetic polyneuropathy: Secondary | ICD-10-CM | POA: Diagnosis not present

## 2023-11-04 DIAGNOSIS — B351 Tinea unguium: Secondary | ICD-10-CM | POA: Diagnosis not present

## 2023-11-04 DIAGNOSIS — M79675 Pain in left toe(s): Secondary | ICD-10-CM | POA: Diagnosis not present

## 2023-11-04 DIAGNOSIS — M79674 Pain in right toe(s): Secondary | ICD-10-CM | POA: Diagnosis not present

## 2023-11-04 NOTE — Progress Notes (Signed)
  Subjective:  Patient ID: Noah Lane, male    DOB: 1955-03-09,   MRN: [id number]  Chief Complaint  Patient presents with   Diabetes    I am here for a scheduled checkout.  A1c 7.1    68 y.o. male presents for concern of thickened elongated and painful nails that are difficult to trim. Requesting to have them trimmed today. Relates burning and tingling in their feet. Patient is diabetic and last A1c was  Lab Results  Component Value Date   HGBA1C 7.1 (H) 08/19/2023   .   PCP:  Frann Mabel Mt, DO      . Denies any other pedal complaints. Denies n/v/f/c.   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 CFT <3 seconds. Absent hair growth on digits. Edema noted to bilateral lower extremities. Xerosis noted bilaterally.  Skin. No lacerations or abrasions bilateral feet. Nails 1-5 bilateral  are thickened discolored and elongated with subungual debris.  Musculoskeletal: MMT 5/5 bilateral lower extremities in DF, PF, Inversion and Eversion. Deceased ROM in DF of ankle joint. No pain to palpation about the toe.  Neurological: Sensation intact to light touch. Protective sensation diminished bilateral.    Assessment:   1. Pain due to onychomycosis of toenails of both feet   2. Type 2 diabetes mellitus with peripheral neuropathy (HCC)         Plan:  Patient was evaluated and treated and all questions answered. -Discussed and educated patient on diabetic foot care, especially with  regards to the vascular, neurological and musculoskeletal systems.  -Stressed the importance of good glycemic control and the detriment of not  controlling glucose levels in relation to the foot. -Discussed supportive shoes at all times and checking feet regularly.  -Mechanically debrided all nails 1-5 bilateral using sterile nail nipper and filed with  dremel without incident  -Continue penlac  -Answered all patient questions -Patient to return  in 3 months for at risk foot care -Patient advised to call the office if any problems or questions arise in the meantime.     Asberry Failing, DPM

## 2023-11-07 ENCOUNTER — Other Ambulatory Visit: Payer: Self-pay | Admitting: Family Medicine

## 2023-11-16 ENCOUNTER — Telehealth: Payer: Self-pay | Admitting: Pharmacist

## 2023-11-16 ENCOUNTER — Other Ambulatory Visit: Payer: Self-pay

## 2023-11-16 ENCOUNTER — Encounter: Payer: Self-pay | Admitting: Family Medicine

## 2023-11-16 NOTE — Telephone Encounter (Signed)
 Received fax from [facility] requesting refill reorder for Ozempic . Completed reorder forms and forwarded to PCP to review and sign.

## 2023-11-18 ENCOUNTER — Other Ambulatory Visit: Payer: Self-pay | Admitting: Family Medicine

## 2023-12-06 ENCOUNTER — Telehealth: Payer: Self-pay | Admitting: Pharmacist

## 2023-12-06 NOTE — Telephone Encounter (Signed)
 Received a fax from [facility] that the date was missing from patient's reorder form for Ozempic . Reviewed documents faxed and the date is there but may not have been legible. Called [facility]. They are asking for clarification on physician's signature date. Updated date to be clarified and refaxed to [facility].

## 2023-12-07 ENCOUNTER — Telehealth: Payer: Self-pay | Admitting: Family Medicine

## 2023-12-07 NOTE — Telephone Encounter (Signed)
 Pt's wife dropped off information for rx. Placed in pcp's tray in front office.

## 2023-12-07 NOTE — Telephone Encounter (Signed)
 Patient assistance paperwork placed in Tammy bin for review.

## 2023-12-09 ENCOUNTER — Encounter: Payer: Self-pay | Admitting: Pharmacist

## 2023-12-09 ENCOUNTER — Ambulatory Visit: Payer: Self-pay

## 2023-12-09 NOTE — Telephone Encounter (Signed)
 FYI Only or Action Required?: FYI only for provider.  Patient was last seen in primary care on 09/12/2023 by Frann Mabel Mt, DO.  Called Nurse Triage reporting Back Pain.  Symptoms began a week ago.  Interventions attempted: OTC medications: Tylenol  .  Symptoms are: gradually worsening.  Triage Disposition: See PCP When Office is Open (Within 3 Days)  Patient/caregiver understands and will follow disposition?: Yes  **Appt. Scheduled for 10/13**         Copied from CRM #[id number]. Topic: Clinical - Red Word Triage >> Dec 09, 2023  9:07 AM Roselie BROCKS wrote: Red Word that prompted transfer to Nurse Triage: Patient states she is having severe back pain Reason for Disposition  [1] MODERATE back pain (e.g., interferes with normal activities) AND [2] present > 3 days  Answer Assessment - Initial Assessment Questions 1. ONSET: When did the pain begin? (e.g., minutes, hours, days)     X 1 week, ambulation makes pain worse, has chronic back pain hx.   2. LOCATION: Where does it hurt? (upper, mid or lower back)     Lower   3. SEVERITY: How bad is the pain?  (e.g., Scale 1-10; mild, moderate, or severe)     Moderate-severe   4. PATTERN: Is the pain constant? (e.g., yes, no; constant, intermittent)      Intermittent with movement   5. RADIATION: Does the pain shoot into your legs or somewhere else?     No   6. CAUSE:  What do you think is causing the back pain?      Chronic back pain, over the past couple of years pain is worsening   7. BACK OVERUSE:  Any recent lifting of heavy objects, strenuous work or exercise?     No   8. MEDICINES: What have you taken so far for the pain? (e.g., nothing, acetaminophen , NSAIDS)     Tylenol    9. NEUROLOGIC SYMPTOMS: Do you have any weakness, numbness, or problems with bowel/bladder control?     No   10. OTHER SYMPTOMS: Do you have any other symptoms? (e.g., fever, abdomen pain, burning with urination, blood  in urine)  No .   Appt. Scheduled for 10/13 to follow up on back pain.  Protocols used: Back Pain-A-AH

## 2023-12-12 ENCOUNTER — Ambulatory Visit: Admitting: Medical

## 2023-12-12 ENCOUNTER — Encounter: Payer: Self-pay | Admitting: Medical

## 2023-12-12 VITALS — BP 136/75 | HR 60 | Temp 98.5°F | Resp 16 | Ht 73.0 in | Wt 252.0 lb

## 2023-12-12 DIAGNOSIS — M542 Cervicalgia: Secondary | ICD-10-CM

## 2023-12-12 DIAGNOSIS — E782 Mixed hyperlipidemia: Secondary | ICD-10-CM

## 2023-12-12 DIAGNOSIS — E1165 Type 2 diabetes mellitus with hyperglycemia: Secondary | ICD-10-CM

## 2023-12-12 DIAGNOSIS — E781 Pure hyperglyceridemia: Secondary | ICD-10-CM

## 2023-12-12 DIAGNOSIS — Z23 Encounter for immunization: Secondary | ICD-10-CM | POA: Diagnosis not present

## 2023-12-12 DIAGNOSIS — G4730 Sleep apnea, unspecified: Secondary | ICD-10-CM

## 2023-12-12 DIAGNOSIS — M545 Low back pain, unspecified: Secondary | ICD-10-CM

## 2023-12-12 DIAGNOSIS — K130 Diseases of lips: Secondary | ICD-10-CM

## 2023-12-12 DIAGNOSIS — Z7985 Long-term (current) use of injectable non-insulin antidiabetic drugs: Secondary | ICD-10-CM

## 2023-12-12 MED ORDER — CELECOXIB 200 MG PO CAPS: 200.0000 mg | ORAL_CAPSULE | Freq: Every day | ORAL | 0 refills | Status: DC

## 2023-12-12 NOTE — Telephone Encounter (Signed)
 Please see phone message from 12/06/2023. Addressed clarification requested by [facility]. Called [facility] patient assistance program today and verified Noah Lane's Ozempic  1mg  order is in process. Expect delivery by 12/27/2023 ([facility] states processing and delivery take 10 to 14 business days from date of dose increase approval).

## 2023-12-12 NOTE — Progress Notes (Signed)
 Subjective:    Patient ID: Noah Lane, male    DOB: [birth year], 68 y.o.   MRN: [id number]  HPI  Noah Lane is a 68 year old male with a history of back pain who presents with acute exacerbation of lower back pain.  He has been experiencing a recurrence of lower back pain for approximately six to seven days. The pain is described as piercing and is particularly bothersome when moving from a sitting to a standing position. He has a history of intermittent back pain over the years, managed with physical therapy and medication. Recently, he took Celebrex for three consecutive days, which provided relief, but he is concerned about the impact of medications on his kidney function due to diabetes. In 2023, he experienced back pain following a fall, but imaging at that time showed no fractures or subluxations, though multilevel degenerative changes and scoliosis were noted. No radiation of pain to his legs or new numbness beyond his baseline neuropathy. The pain has been significant enough that he considered going to the emergency room over the weekend.  He has a history of diabetes, with a recent A1c of 7.1, which has improved with an increased dose of Ozempic . He monitors his blood glucose regularly and reports a decrease in average levels since the medication adjustment.  He also mentions having sleep apnea and using a CPAP machine that is nearing the end of its effective life. He request referral back to former pulmonologist.  Also new small cyst upper lip found about 8-9 days.       Review of Systems  Constitutional:  Negative for chills and fever.  HENT:  Negative for congestion.        Cyst of lip  Respiratory:  Negative for choking, shortness of breath and wheezing.        Pt has sleep apnea.  Cardiovascular:  Negative for chest pain and palpitations.  Gastrointestinal:  Negative for abdominal pain, diarrhea and vomiting.  Genitourinary:  Negative for enuresis, genital sores  and urgency.  Musculoskeletal:  Positive for back pain.  Skin:  Negative for rash.  Neurological:  Negative for dizziness, syncope, weakness and light-headedness.  Hematological:  Negative for adenopathy.  Psychiatric/Behavioral:  Negative for behavioral problems, dysphoric mood and sleep disturbance.      Past Medical History:  Diagnosis Date   Broken toe    Diabetes mellitus type 2 in obese    Mixed hyperlipidemia      Social History   Socioeconomic History   Marital status: Married    Spouse name: Donny   Number of children: Not on file   Years of education: Not on file   Highest education level: Master's degree (e.g., MA, MS, MEng, MEd, MSW, MBA)  Occupational History   Occupation: retired    Comment: self employed-still working some  Tobacco Use   Smoking status: Former    Current packs/day: 0.00    Average packs/day: 0.5 packs/day for 15.0 years (7.5 ttl pk-yrs)    Types: Cigarettes    Start date: 63    Quit date: 1993    Years since quitting: 32.8   Smokeless tobacco: Never  Vaping Use   Vaping status: Never Used  Substance and Sexual Activity   Alcohol use: Yes    Alcohol/week: 1.0 standard drink of alcohol    Types: 1 Glasses of wine per week    Comment: 1.5 4oz glasses per week of wine   Drug use: Never   Sexual activity: Not  Currently  Other Topics Concern   Not on file  Social History Narrative   Not on file   Social Drivers of Health   Financial Resource Strain: Low Risk  (08/13/2023)   Overall Financial Resource Strain (CARDIA)    Difficulty of Paying Living Expenses: Not hard at all  Food Insecurity: No Food Insecurity (08/13/2023)   Hunger Vital Sign    Worried About Running Out of Food in the Last Year: Never true    Ran Out of Food in the Last Year: Never true  Transportation Needs: No Transportation Needs (08/13/2023)   PRAPARE - Administrator, Civil Service (Medical): No    Lack of Transportation (Non-Medical): No  Physical  Activity: Insufficiently Active (08/13/2023)   Exercise Vital Sign    Days of Exercise per Week: 4 days    Minutes of Exercise per Session: 30 min  Stress: No Stress Concern Present (08/13/2023)   [employer] of Occupational Health - Occupational Stress Questionnaire    Feeling of Stress: Only a little  Social Connections: Socially Integrated (08/13/2023)   Social Connection and Isolation Panel    Frequency of Communication with Friends and Family: Twice a week    Frequency of Social Gatherings with Friends and Family: Once a week    Attends Religious Services: More than 4 times per year    Active Member of [employer] or Organizations: Yes    Attends Engineer, structural: More than 4 times per year    Marital Status: Married  Catering manager Violence: Not At Risk (09/23/2022)   Humiliation, Afraid, Rape, and Kick questionnaire    Fear of Current or Ex-Partner: No    Emotionally Abused: No    Physically Abused: No    Sexually Abused: No    Past Surgical History:  Procedure Laterality Date   COLON RESECTION N/A 04/27/2022   Procedure: LAPAROSCOPIC HAND ASSISTED RIGHT HEMI COLECTOMY, POSSIBLE OPEN;  Surgeon: Dasie Leonor CROME, MD;  Location: [facility];  Service: General;  Laterality: N/A;   COLONOSCOPY     HERNIA REPAIR  2012    Family History  Problem Relation Age of Onset   Heart disease Mother    Diabetes Father    Cancer Father        post age of 25   Colon cancer Neg Hx    Colon polyps Neg Hx    Esophageal cancer Neg Hx    Rectal cancer Neg Hx    Stomach cancer Neg Hx     No Known Allergies  Current Outpatient Medications on File Prior to Visit  Medication Sig Dispense Refill   atorvastatin  (LIPITOR) 20 MG tablet TAKE 1 TABLET BY MOUTH DAILY 90 tablet 3   ciclopirox  (PENLAC ) 8 % solution Apply topically at bedtime. Apply over nail and surrounding skin. Apply daily over previous coat. After seven (7) days, may remove with alcohol and continue cycle. 6.6 mL 0    Continuous Glucose Receiver (FREESTYLE LIBRE 2 READER) DEVI Change every 14 days 1 each 5   metFORMIN  (GLUCOPHAGE -XR) 500 MG 24 hr tablet TAKE 1 TABLET BY MOUTH DAILY WITH BREAKFAST 88 tablet 1   olmesartan  (BENICAR ) 20 MG tablet TAKE 1 TABLET BY MOUTH DAILY 90 tablet 1   PRESCRIPTION MEDICATION Inject 4 mLs as directed as needed (erectile disfunction). prostaglandin     Semaglutide , 1 MG/DOSE, 4 MG/3ML SOPN Inject 1 mg as directed once a week. 3 mL 5   No current facility-administered medications on file  prior to visit.    BP (!) 140/70   Pulse 60   Temp 98.5 F (36.9 C) (Oral)   Resp 16   Ht 6' 1 (1.854 m)   Wt 252 lb (114.3 kg)   SpO2 97%   BMI 33.25 kg/m         Objective:   Physical Exam  General Appearance- Not in acute distress.    Chest and Lung Exam Auscultation: Breath sounds:-Normal. Clear even and unlabored. Adventitious sounds:- No Adventitious sounds.  Cardiovascular Auscultation:Rythm - Regular, rate and rythm. Heart Sounds -Normal heart sounds.  Abdomen Inspection:-Inspection Normal.  Palpation/Perucssion: Palpation and Percussion of the abdomen reveal- Non Tender, No Rebound tenderness, No rigidity(Guarding) and No Palpable abdominal masses.  Liver:-Normal.  Spleen:- Normal.   Back Mid lumbar spine tenderness to palpation. No pain on straight leg lift. Pain on lateral movements and flexion/extension of the spine.  Lower ext neurologic  L5-S1 sensation intact bilaterally. Normal patellar reflexes bilaterally. No foot drop bilaterally.   Heent- mid line upper lip small cyst. Lower ext- no pedal edema. Negative homans signs.      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 No new neurological symptoms. Previous imaging showed degenerative changes and scoliosis. Celebrex effective for pain management. - Prescribe Celebrex 200 mg once daily for 7  days, total 10 tablets. Discontinue if pain resolves in 5-7 days. - Refer to physical therapy. - Provide back stretching exercises as tolerated. - Advise weight loss of 5-10 pounds. - Ensure hydration while on NSAID. - Schedule follow-up in 7-10 days if pain persists.  Type 2 diabetes mellitus Recent A1c 7.1%. Improved glucose control with increased Ozempic  dose, estimating A1c reduction to 6.2%. - Order metabolic panel, A1c, and lipid panel in a few days. - Monitor blood glucose with sensor.  For high triglycerides -future lipid panel  Sleep apnea CPAP machine nearing end of effective life. Last seen by pulmonologist in 2023. - Refer to pulmonologist for CPAP evaluation and management.  Cyst of lip Lesion possibly a dermoid or mucous retention cyst. Benign appearance, no immediate intervention due to location and bleeding risk. - Refer to ENT for evaluation.  Follow up 7-10 days or sooner if needed

## 2023-12-12 NOTE — Patient Instructions (Addendum)
 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 No new neurological symptoms. Previous imaging showed degenerative changes and scoliosis. Celebrex effective for pain management. - Prescribe Celebrex 200 mg once daily for 7 days, total 10 tablets. Discontinue if pain resolves in 5-7 days. - Refer to physical therapy. - Provide back stretching exercises as tolerated. - Advise weight loss of 5-10 pounds. - Ensure hydration while on NSAID. - Schedule follow-up in 7-10 days if pain persists.  Type 2 diabetes mellitus Recent A1c 7.1% in summer. - Order metabolic panel, A1c, and lipid panel in a few days. - Monitor blood glucose with sensor.  For high triglycerides -future lipid panel  Sleep apnea CPAP machine nearing end of effective life. Last seen by pulmonologist in 2023. - Refer to pulmonologist for CPAP evaluation and management.  Cyst of lip Lesion possibly a dermoid or mucous retention cyst. Benign appearance, no immediate intervention due to location and bleeding risk. - Refer to ENT for evaluation.  Follow up 7-10 days or sooner if needed   Back Exercises These exercises help to make your trunk and back strong. They also help to keep the lower back flexible. Doing these exercises can help to prevent or lessen pain in your lower back. If you have back pain, try to do these exercises 2-3 times each day or as told by your doctor. As you get better, do the exercises once each day. Repeat the exercises more often as told by your doctor. To stop back pain from coming back, do the exercises once each day, or as told by your doctor. Do exercises exactly as told by your doctor. Stop right away if you feel sudden pain or your pain gets worse. Exercises Single knee to chest Do these steps 3-5 times in a row for each leg: Lie on your back on a firm bed or the floor with your legs stretched out. Bring one knee to your  chest. Grab your knee or thigh with both hands and hold it in place. Pull on your knee until you feel a gentle stretch in your lower back or butt. Keep doing the stretch for 10-30 seconds. Slowly let go of your leg and straighten it. Pelvic tilt Do these steps 5-10 times in a row: Lie on your back on a firm bed or the floor with your legs stretched out. Bend your knees so they point up to the ceiling. Your feet should be flat on the floor. Tighten your lower belly (abdomen) muscles to press your lower back against the floor. This will make your tailbone point up to the ceiling instead of pointing down to your feet or the floor. Stay in this position for 5-10 seconds while you gently tighten your muscles and breathe evenly. Cat-cow Do these steps until your lower back bends more easily: Get on your hands and knees on a firm bed or the floor. Keep your hands under your shoulders, and keep your knees under your hips. You may put padding under your knees. Let your head hang down toward your chest. Tighten (contract) the muscles in your belly. Point your tailbone toward the floor so your lower back becomes rounded like the back of a cat. Stay in this position for 5 seconds. Slowly lift your head. Let the muscles of your belly relax. Point your tailbone up toward the ceiling so your back forms a sagging arch like the back of a cow. Stay in this position for 5 seconds.  Press-ups Do these steps 5-10 times in a row: Lie on your belly (face-down) on a firm bed or the floor. Place your hands near your head, about shoulder-width apart. While you keep your back relaxed and keep your hips on the floor, slowly straighten your arms to raise the top half of your body and lift your shoulders. Do not use your back muscles. You may change where you place your hands to make yourself more comfortable. Stay in this position for 5 seconds. Keep your back relaxed. Slowly return to lying flat on the  floor.  Bridges Do these steps 10 times in a row: Lie on your back on a firm bed or the floor. Bend your knees so they point up to the ceiling. Your feet should be flat on the floor. Your arms should be flat at your sides, next to your body. Tighten your butt muscles and lift your butt off the floor until your waist is almost as high as your knees. If you do not feel the muscles working in your butt and the back of your thighs, slide your feet 1-2 inches (2.5-5 cm) farther away from your butt. Stay in this position for 3-5 seconds. Slowly lower your butt to the floor, and let your butt muscles relax. If this exercise is too easy, try doing it with your arms crossed over your chest. Belly crunches Do these steps 5-10 times in a row: Lie on your back on a firm bed or the floor with your legs stretched out. Bend your knees so they point up to the ceiling. Your feet should be flat on the floor. Cross your arms over your chest. Tip your chin a little bit toward your chest, but do not bend your neck. Tighten your belly muscles and slowly raise your chest just enough to lift your shoulder blades a tiny bit off the floor. Avoid raising your body higher than that because it can put too much stress on your lower back. Slowly lower your chest and your head to the floor. Back lifts Do these steps 5-10 times in a row: Lie on your belly (face-down) with your arms at your sides, and rest your forehead on the floor. Tighten the muscles in your legs and your butt. Slowly lift your chest off the floor while you keep your hips on the floor. Keep the back of your head in line with the curve in your back. Look at the floor while you do this. Stay in this position for 3-5 seconds. Slowly lower your chest and your face to the floor. Contact a doctor if: Your back pain gets a lot worse when you do an exercise. Your back pain does not get better within 2 hours after you exercise. If you have any of these  problems, stop doing the exercises. Do not do them again unless your doctor says it is okay. Get help right away if: You have sudden, very bad back pain. If this happens, stop doing the exercises. Do not do them again unless your doctor says it is okay. This information is not intended to replace advice given to you by your health care provider. Make sure you discuss any questions you have with your health care provider. Document Revised: 04/30/2020 Document Reviewed: 04/30/2020 Elsevier Patient Education  2024 [employer].

## 2023-12-19 ENCOUNTER — Other Ambulatory Visit: Payer: Self-pay | Admitting: Medical

## 2023-12-27 NOTE — Therapy (Signed)
 OUTPATIENT PHYSICAL THERAPY THORACOLUMBAR EVALUATION   Patient Name: Noah Lane MRN: [id number] DOB:[birth year], 68 y.o., male Today's Date: 12/28/2023  END OF SESSION:  PT End of Session - 12/28/23 0940     Visit Number 1    Date for Recertification  02/22/24    PT Start Time 0800    PT Stop Time 0850    PT Time Calculation (min) 50 min    Activity Tolerance Patient tolerated treatment well;No increased pain    Behavior During Therapy WFL for tasks assessed/performed          Past Medical History:  Diagnosis Date   Broken toe    Diabetes mellitus type 2 in obese    Mixed hyperlipidemia    Past Surgical History:  Procedure Laterality Date   COLON RESECTION N/A 04/27/2022   Procedure: LAPAROSCOPIC HAND ASSISTED RIGHT HEMI COLECTOMY, POSSIBLE OPEN;  Surgeon: Dasie Leonor CROME, MD;  Location: [facility];  Service: General;  Laterality: N/A;   COLONOSCOPY     HERNIA REPAIR  2012   Patient Active Problem List   Diagnosis Date Noted   Elevated blood pressure reading 05/04/2022   Colon adenocarcinoma (HCC) 05/04/2022   Gastroesophageal reflux disease 04/29/2022   Ileus, postoperative (HCC) 04/29/2022   Hypokalemia 04/28/2022   Hyperlipidemia 04/28/2022   Colonic obstruction (HCC) 04/25/2022   Hyponatremia 04/25/2022   OSA on CPAP 12/03/2021   Obesity (BMI 30.0-34.9) 12/03/2021   Mixed hyperlipidemia 10/20/2021   Strain of lumbar region 10/16/2021   Strain of calf muscle 07/22/2021   Patellofemoral pain syndrome of both knees 02/24/2021   Rotator cuff tendinitis, right 12/28/2019   Hip pain, bilateral 05/28/2019   Chronic bilateral low back pain without sciatica 05/28/2019   Type 2 diabetes mellitus with obesity 05/28/2019   Erectile dysfunction 05/28/2019   Pain and swelling of left knee 03/12/2019   Lateral epicondylitis, right elbow 03/15/2017   Diverticulosis of large intestine without hemorrhage 02/25/2014    PCP: Frann Mabel Mt, DO   REFERRING  PROVIDER: Dorina Loving, PA-C   REFERRING DIAG: M54.50 (ICD-10-CM) - Acute midline low back pain without sciatica  THERAPY DIAG:  Other low back pain  Muscle weakness (generalized)  Abnormal posture  RATIONALE FOR EVALUATION AND TREATMENT: Rehabilitation  ONSET DATE: 2 weeks ago  NEXT MD VISIT:    SUBJECTIVE:  SUBJECTIVE STATEMENT: 68 y/o patient referred to PT for LBP.   He is well known to us  from recent episodes of PT for this issue with the latest being about 2 months ago.  Patient states 2 weeks ago he started having severe R sided LBP with any movement.   He reports the pain was severe initially, but is now doing better with taking Celebrex.  States he can't recall any injury that would have precipitated this.  He states that he is currently having a lot of soreness now, but it is not constant.  States feels it more with sit to stand transfers.  Played golf 3-4 weeks ago, but doesn't think this aggravated him.  Doing piriformis stretching and lying flat helps him some.   He has had a back injection many years ago, but nothing since then.  No MRI.  Old xray shows DDD, facet arthropathy.    He does have a long history of off/on back pain with difficulty playing golf at times, which he likes to do.   However he states he is not going to try to play anymore this year, and is going to try to focus on exercises to improve his back pain and prevent reinjury  PAIN: Are you having pain? Yes: NPRS scale: 5/10 now and worst over last week.    Pain location: central low back now Pain description: soreness most of the time; sharp at times Aggravating factors: lifting,  Relieving factors: lying flat  PERTINENT HISTORY:  DM-II, obesity, elevated BP, GERD, B patellofemoral pain syndrome, B hip  pain, h/o R RTC tendinitis, chronic B LBP w/o sciatica, scoliosis, diabetic peripheral neuropathy   PRECAUTIONS: None  RED FLAGS: None  WEIGHT BEARING RESTRICTIONS: No  FALLS:  Has patient fallen in last 6 months? No  LIVING ENVIRONMENT: Lives with: lives with their family and lives with their spouse Lives in: House/apartment Stairs: Yes: Internal: 14 steps; on left going up and External: 1 steps; unknown Has following equipment at home: Single point cane and Environmental Consultant - 2 wheeled  OCCUPATION: pharmacist, hospital, mostly seated desk work and driving  PLOF: Independent  PATIENT GOALS: improve back pain and prevent further flareups/reinjuries   OBJECTIVE: (objective measures completed at initial evaluation unless otherwise dated)  DIAGNOSTIC FINDINGS:  EXAM: LUMBAR SPINE - 2-3 VIEW   COMPARISON:  None Available.   FINDINGS: Evaluation is limited due to body habitus.   Five lumbar type vertebra. There is no acute fracture or subluxation of the lumbar spine. There is multilevel degenerative changes with disc space narrowing, endplate irregularity and spurring/osteophyte. Multilevel facet arthropathy. The visualized posterior elements appear intact. There is atherosclerotic calcification of the aorta. The soft tissues are grossly unremarkable.   IMPRESSION: No acute fracture or subluxation of the lumbar spine.     Electronically Signed   By: Vanetta Chou M.D.   On: 10/18/2021 20:41  PATIENT SURVEYS:  Modified Oswestry:  MODIFIED OSWESTRY DISABILITY SCALE  Date: 12/28/2023 Score  Pain intensity 2 =  Pain medication provides me with complete relief from pain.  2. Personal care (washing, dressing, etc.) 1 =  I can take care of myself normally, but it increases my pain.  3. Lifting 4 = I can lift only very light weights  4. Walking 1 = Pain prevents me from walking more than 1 mile.  5. Sitting 2 =  Pain prevents me from sitting more than 1 hour.  6.  Standing 4 =  Pain prevents me  from standing more than 10 minutes.  7. Sleeping 1 = I can sleep well only by using pain medication.  8. Social Life 1 =  My social life is normal, but it increases my level of pain.  9. Traveling 1 =  I can travel anywhere, but it increases my pain.  10. Employment/ Homemaking 2 = I can perform most of my homemaking/job duties, but pain prevents me from performing more physically stressful activities (eg, lifting, vacuuming).  Total 19/50   Interpretation of scores: Score Category Description  0-20% Minimal Disability The patient can cope with most living activities. Usually no treatment is indicated apart from advice on lifting, sitting and exercise  21-40% Moderate Disability The patient experiences more pain and difficulty with sitting, lifting and standing. Travel and social life are more difficult and they may be disabled from work. Personal care, sexual activity and sleeping are not grossly affected, and the patient can usually be managed by conservative means  41-60% Severe Disability Pain remains the main problem in this group, but activities of daily living are affected. These patients require a detailed investigation  61-80% Crippled Back pain impinges on all aspects of the patient's life. Positive intervention is required  81-100% Bed-bound These patients are either bed-bound or exaggerating their symptoms  Bluford FORBES Zoe DELENA Karon DELENA, et al. Surgery versus conservative management of stable thoracolumbar fracture: the PRESTO feasibility RCT. [place] ([place]): [employer]; 2021 Nov. [facility] Technology Assessment, No. 25.62.) Appendix 3, Oswestry Disability Index category descriptors. Available from: Findjewelers.cz  Minimally Clinically Important Difference (MCID) = 12.8%   NDI:  NECK DISABILITY INDEX  Date: 12/28/2023 Score  Pain intensity 3 = The pain is fairly severe at the moment  2. Personal care (washing,  dressing, etc.) 1 =  I can look after myself normally but it causes extra pain  3. Lifting 3 = Pain prevents me from lifting heavy weights but I can manage light to medium   weights if they are conveniently positioned  4. Reading 2 =  I can read as much as I want with moderate pain in my neck  5. Headaches 2 =  I have moderate headaches, which come infrequently  6. Concentration 1 =  I can concentrate fully when I want to with slight difficulty   7. Work 1 =  I can only do my usual work, but no more  8. Driving 1 =  I can drive my car as long as I want with slight pain in my neck  9. Sleeping 1 = My sleep is slightly disturbed (less than 1 hr sleepless)  10. Recreation 3 = I am able to engage in a few of my usual recreation activities because of pain in   my neck  Total 18/50 = 36%   Minimum Detectable Change (90% confidence): 5 points or 10% points   SCREENING FOR RED FLAGS: Bowel or bladder incontinence: No Spinal tumors: No Cauda equina syndrome: No Compression fracture: No Abdominal aneurysm: No  COGNITION:  Overall cognitive status: Within functional limits for tasks assessed    SENSATION: WFL  POSTURE:  Has some Leftward scoliosis present;   increased lumbar extension/sway position   Supine:  Equal pelvic landmarks;  longer LLE at the malleolus by a small amount   PALPATION: Not specifically painful;   deep palpation of lumbar paraspinals and QL are nonpainful Lumbar posterior/anterior glides and rotational glides are non painful No palpation reproduces his pain  LUMBAR ROM:  Active  Eval  Flexion To knees; some end range pain  Extension 100%  Right lateral flexion To upper thigh;  stiff feeling  Left lateral flexion To upper thigh; stiff feeling  Right rotation 75%; stiff  Left rotation 100%  (Blank rows = not tested)  MUSCLE LENGTH: Hamstrings: Right SLR = 80 deg; Left SLR = 50 deg Hamstrings: very tight L, mild tight R ITB: NT Piriformis: a little  tight BLE Hip flexors: not tight Quads: NT Heelcord: NT  LOWER EXTREMITY ROM:     Active  Right eval Left eval  Hip flexion    Hip extension    Hip abduction    Hip adduction    Hip internal rotation    Hip external rotation    Knee flexion    Knee extension    Ankle dorsiflexion    Ankle plantarflexion    Ankle inversion    Ankle eversion    (Blank rows = not tested)  LOWER EXTREMITY MMT:    MMT Right eval Left eval  Hip flexion 4+ 4  Hip extension 3+ 4-  Hip abduction 4- 4  Hip adduction    Hip internal rotation 4 4-  Hip external rotation 4+ 4  Knee flexion 4 4-  Knee extension 5 4+  Ankle dorsiflexion 5 5  Ankle plantarflexion 5 5  Ankle inversion    Ankle eversion     (Blank rows = not tested)  LUMBAR SPECIAL TESTS:  Straight leg raise test: Negative, Slump test: Negative, and Long sit test: Negative   Longer L malleolus but no change with long sit test;  Thomas test is good flexibility, but increases his back pain for BLE (denies any SI pain)  FUNCTIONAL TESTS:  TBD if needed  GAIT: Distance walked: into clinic x 200' Assistive device utilized: None Level of assistance: Complete Independence Gait pattern: significant toe out BLE nearly contacting his other heel on swing phase of gait Comments: no limping noted   TODAY'S TREATMENT:  12/28/23 SELF CARE: Provided education on PT POC progression.   PATIENT EDUCATION:  Education details: PT eval findings, anticipated POC, and initial HEP  Person educated: Patient Education method: Explanation, Demonstration, Verbal cues, Tactile cues, Handouts, and MedBridgeGO app access provided Education comprehension: verbalized understanding, verbal cues required, tactile cues required, and needs further education  HOME EXERCISE PROGRAM: Access Code: [email or web address] URL: [email or web address] Date: 12/28/2023 Prepared by: Garnette Montclair  Exercises - Prone Hip Extension  - 1 x daily - 7 x weekly -  3 sets - 10 reps - Sidelying Hip Abduction  - 1 x daily - 7 x weekly - 3 sets - 10 reps - Side Plank on Knees  - 1 x daily - 7 x weekly - 1 sets - 10 reps - Plank on Knees  - 1 x daily - 7 x weekly - 1 sets - 10 reps - Seated Hamstring Stretch  - 1 x daily - 7 x weekly - 1 sets - 2 reps - 1 min hold - Standard Plank  - 1 x daily - 7 x weekly - 1 sets - 10 reps   ASSESSMENT:  CLINICAL IMPRESSION: Noah Lane is a 68 y.o. male who was referred to physical therapy for evaluation and treatment for acute on chronic LBP   Patient reports onset of  pain beginning 2 weeks ago for no apparent reason.   However, he saw PCP and started celebrex and is feeling better.    Pain is worse with  sit to stand transfers and bending over/lifting and sitting or standing prolonged periods of time.  He spends a lot of time in the car and at his desk.  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  He has a slightly longer LLE and a little leftward leaning sway back posture.  Patient has deficits in lumbar ROM, hamstring LE flexibility, BLE strength, abnormal posture with some leftward lean, and residual pain which are interfering with ADLs and are impacting quality of life.  On Modified Oswestry patient scored 19/50 demonstrating 38% or moderate disability.  Noah Lane will benefit from skilled PT to address above deficits to improve mobility and activity tolerance with decreased pain interference.   OF note, he is also having neck pain and is going to speak with PCP about a referral to address the cervical spine as well.  We will look for this order  OBJECTIVE IMPAIRMENTS: difficulty walking, decreased ROM, decreased strength, postural dysfunction, and pain.   ACTIVITY LIMITATIONS: lifting, bending, sitting, and standing  PARTICIPATION LIMITATIONS: cleaning, laundry, driving, and community activity  PERSONAL FACTORS: Age and 1-2 comorbidities: DM-II, obesity, elevated BP, GERD, B patellofemoral pain syndrome, B hip  pain, h/o R RTC tendinitis, chronic B LBP w/o sciatica, scoliosis, diabetic peripheral neuropathy are also affecting patient's functional outcome.   REHAB POTENTIAL: Good  CLINICAL DECISION MAKING: Evolving/moderate complexity  EVALUATION COMPLEXITY: Moderate   GOALS: Goals reviewed with patient? Yes  SHORT TERM GOALS: Target date: 01/25/2024   Patient will be independent with initial HEP to improve outcomes and carryover.  Baseline: 100% PT assist required for correct completion Goal status: INITIAL  2.  Patient will report 25% improvement in low back pain to improve QOL. Baseline: 5/10 Goal status: INITIAL   LONG TERM GOALS: Target date: 02/22/2024   Patient will be independent with ongoing/advanced HEP for self-management at home.  Baseline: no advanced HEP yet Goal status: INITIAL  2.  Patient will report 50-75% improvement in low back pain to improve QOL.  Baseline: 5/10 Goal status: INITIAL  3.  Patient to demonstrate ability to achieve and maintain good spinal alignment/posturing and body mechanics needed for daily activities. Baseline: leftward lean with sway back Goal status: IN PROGRESS  4.  Patient will demonstrate full pain free lumbar ROM to perform ADLs.   Baseline: Refer to above lumbar ROM table Goal status: INITIAL  5.  Patient will demonstrate improved BLE strength to >/= 5/5 for improved stability and ease of mobility. Baseline: Refer to above LE MMT table Goal status: INITIAL  6. Patient will report </= 20% on Modified Oswestry (MCID = 12%) to demonstrate improved functional ability with decreased pain interference. Baseline: 38% Goal status: INITIAL  7.  Patient will tolerate 30 min of (standing/sitting/walking) w/o increased pain to allow for  improved mobility and activity tolerance. Baseline: states pain begins after 10 min standing/walking Goal status: INITIAL     PLAN:  PT FREQUENCY: 1-2x/week  PT DURATION: 8 weeks  PLANNED  INTERVENTIONS: 97164- PT Re-evaluation, 97110-Therapeutic exercises, 97530- Therapeutic activity, 97112- Neuromuscular re-education, 97535- Self Care, 02859- Manual therapy, G0283- Electrical stimulation (unattended), 97035- Ultrasound, 02987- Traction (mechanical), [id number]- Ionotophoresis 4mg /ml Dexamethasone , 79439 (1-2 muscles), 20561 (3+ muscles)- Dry Needling, Patient/Family education, Balance training, Taping, Joint mobilization, Spinal mobilization, Cryotherapy, and Moist heat  PLAN FOR NEXT SESSION: Progress with lumbar/core strengthening, hamstring flexibility, see how R shoe lift/heel lift did or if he was able to try it;  see if he has an order for his cervical  spine yet and schedule eval for this PRN   Heliodoro Domagalski, PT 12/28/2023, 9:41 AM

## 2023-12-28 ENCOUNTER — Ambulatory Visit: Attending: Medical | Admitting: Rehabilitation

## 2023-12-28 ENCOUNTER — Encounter: Payer: Self-pay | Admitting: Rehabilitation

## 2023-12-28 ENCOUNTER — Other Ambulatory Visit: Payer: Self-pay

## 2023-12-28 ENCOUNTER — Telehealth: Payer: Self-pay | Admitting: Pharmacist

## 2023-12-28 ENCOUNTER — Telehealth: Payer: Self-pay | Admitting: Family Medicine

## 2023-12-28 DIAGNOSIS — R293 Abnormal posture: Secondary | ICD-10-CM | POA: Insufficient documentation

## 2023-12-28 DIAGNOSIS — M6281 Muscle weakness (generalized): Secondary | ICD-10-CM | POA: Diagnosis present

## 2023-12-28 DIAGNOSIS — M545 Low back pain, unspecified: Secondary | ICD-10-CM | POA: Insufficient documentation

## 2023-12-28 DIAGNOSIS — M5459 Other low back pain: Secondary | ICD-10-CM | POA: Insufficient documentation

## 2023-12-28 NOTE — Progress Notes (Signed)
   12/28/2023 Name: Noah Lane MRN: [id number] DOB: 05-Mar-1955  Chief Complaint  Patient presents with   Medication Management    Noah Lane is a 68 y.o. year old male. Completed a MyChart Visit today   They were referred to the pharmacist by their PCP for assistance in managing medication access.    Subjective: Patient is taking Ozempic  to 1mg  weekly.  He has been receiving Ozempic  from [facility]. We received a delivery from [facility] today of Ozempic  1mg  but it was only 1 pen or 28 day supply.   Medication Access/Adherence  Current Pharmacy:  [facility] [id number] - [place], [place] - [address] [place] [address] Phone: [phone] Fax: [phone]   Patient reports affordability concerns with their medications: Yes  Patient reports access/transportation concerns to their pharmacy: No  Patient reports adherence concerns with their medications:  No      Objective:  Lab Results  Component Value Date   HGBA1C 7.1 (H) 08/19/2023    Lab Results  Component Value Date   CREATININE 1.16 08/19/2023   BUN 23 08/19/2023   NA 137 08/19/2023   K 4.3 08/19/2023   CL 102 08/19/2023   CO2 27 08/19/2023    Lab Results  Component Value Date   CHOL 136 08/19/2023   HDL 36.50 (L) 08/19/2023   LDLCALC 65 08/19/2023   LDLDIRECT 132.0 04/22/2022   TRIG 174.0 (H) 08/19/2023   CHOLHDL 4 08/19/2023    Current Outpatient Medications  Medication Instructions   atorvastatin  (LIPITOR) 20 mg, Oral, Daily   celecoxib (CELEBREX) 200 mg, Oral, Daily   ciclopirox  (PENLAC ) 8 % solution Topical, Daily at bedtime, Apply over nail and surrounding skin. Apply daily over previous coat. After seven (7) days, may remove with alcohol and continue cycle.   Continuous Glucose Receiver (FREESTYLE LIBRE 2 READER) DEVI Change every 14 days   metFORMIN  (GLUCOPHAGE -XR) 500 mg, Oral, Daily with breakfast   olmesartan  (BENICAR ) 20 mg,  Oral, Daily   PRESCRIPTION MEDICATION 4 mLs, As needed   Semaglutide  (1 MG/DOSE) 1 mg, Injection, Weekly      Assessment/Plan:   Medication Access:  - Called [facility]. They did make mistake and only sent #1 box of Ozempic . The order was for 4 boxes. Since patient's enrollment ends 02/29/2024, they are only sending 1 additional box. Patient was notified.  - Discussed 2026 plans for Ozempic . Patient is planning to speak to his insurance representative with his past employer this week to review possible cost of Ozempic  for 2026.   Follow Up Plan: 2 to 3 weeks to follow up medication cost and check medication adherece  Madelin Ray, PharmD Clinical Pharmacist [facility]

## 2023-12-28 NOTE — Telephone Encounter (Signed)
 Called pt was advised Ozempic  here for pickup and he stated understand.

## 2023-12-28 NOTE — Addendum Note (Signed)
 Addended by: DORINA DALLAS HERO on: 12/28/2023 11:37 AM   Modules accepted: Orders

## 2023-12-28 NOTE — Telephone Encounter (Signed)
 Pt came in stating he seen [facility] last for back pain and went to physical therapy and they asked him to ask [facility] to write a referral for neck pain due to pt complaining about his neck pain during physical therapy. Please call pt when it has been placed.

## 2023-12-29 ENCOUNTER — Other Ambulatory Visit: Payer: Self-pay | Admitting: Medical

## 2023-12-29 ENCOUNTER — Telehealth (HOSPITAL_BASED_OUTPATIENT_CLINIC_OR_DEPARTMENT_OTHER): Payer: Self-pay | Admitting: Nurse Practitioner

## 2023-12-29 NOTE — Telephone Encounter (Signed)
 Patient scheduled OV with Dr Jude on 11/7 via Mychart. However, patient has never seen provider before which made this a scheduling error. Left voicemail for patient informing that we would not be able to see him that day due to that. When patient calls back, please offer him appointment to be seen with Candis Dandy at [facility]. The 11/6 appointment has been cancelled, and voicemail informed patient of this and to call to see Candis instead.

## 2023-12-30 ENCOUNTER — Other Ambulatory Visit: Payer: Self-pay | Admitting: Pharmacist

## 2024-01-04 ENCOUNTER — Encounter: Payer: Self-pay | Admitting: Rehabilitation

## 2024-01-04 ENCOUNTER — Ambulatory Visit: Attending: Medical | Admitting: Rehabilitation

## 2024-01-04 DIAGNOSIS — M6281 Muscle weakness (generalized): Secondary | ICD-10-CM | POA: Insufficient documentation

## 2024-01-04 DIAGNOSIS — R293 Abnormal posture: Secondary | ICD-10-CM | POA: Insufficient documentation

## 2024-01-04 DIAGNOSIS — M542 Cervicalgia: Secondary | ICD-10-CM | POA: Insufficient documentation

## 2024-01-04 DIAGNOSIS — M5412 Radiculopathy, cervical region: Secondary | ICD-10-CM | POA: Insufficient documentation

## 2024-01-04 DIAGNOSIS — R2681 Unsteadiness on feet: Secondary | ICD-10-CM | POA: Insufficient documentation

## 2024-01-04 DIAGNOSIS — M5459 Other low back pain: Secondary | ICD-10-CM | POA: Insufficient documentation

## 2024-01-04 NOTE — Therapy (Unsigned)
 OUTPATIENT PHYSICAL THERAPY CERVICAL EVALUATION   Patient Name: Rico Massar MRN: [id number] DOB:[birth year], 68 y.o., male Today's Date: 01/05/2024  END OF SESSION:  PT End of Session - 01/04/24 0803     Visit Number 2    Date for Recertification  02/22/24    PT Start Time 0800    PT Stop Time 0845    PT Time Calculation (min) 45 min    Activity Tolerance Patient tolerated treatment well;No increased pain    Behavior During Therapy WFL for tasks assessed/performed          Past Medical History:  Diagnosis Date   Broken toe    Diabetes mellitus type 2 in obese    Mixed hyperlipidemia    Past Surgical History:  Procedure Laterality Date   COLON RESECTION N/A 04/27/2022   Procedure: LAPAROSCOPIC HAND ASSISTED RIGHT HEMI COLECTOMY, POSSIBLE OPEN;  Surgeon: Dasie Leonor CROME, MD;  Location: [facility];  Service: General;  Laterality: N/A;   COLONOSCOPY     HERNIA REPAIR  2012   Patient Active Problem List   Diagnosis Date Noted   Elevated blood pressure reading 05/04/2022   Colon adenocarcinoma (HCC) 05/04/2022   Gastroesophageal reflux disease 04/29/2022   Ileus, postoperative (HCC) 04/29/2022   Hypokalemia 04/28/2022   Hyperlipidemia 04/28/2022   Colonic obstruction (HCC) 04/25/2022   Hyponatremia 04/25/2022   OSA on CPAP 12/03/2021   Obesity (BMI 30.0-34.9) 12/03/2021   Mixed hyperlipidemia 10/20/2021   Strain of lumbar region 10/16/2021   Strain of calf muscle 07/22/2021   Patellofemoral pain syndrome of both knees 02/24/2021   Rotator cuff tendinitis, right 12/28/2019   Hip pain, bilateral 05/28/2019   Chronic bilateral low back pain without sciatica 05/28/2019   Type 2 diabetes mellitus with obesity 05/28/2019   Erectile dysfunction 05/28/2019   Pain and swelling of left knee 03/12/2019   Lateral epicondylitis, right elbow 03/15/2017   Diverticulosis of large intestine without hemorrhage 02/25/2014    PCP: Frann Mabel Mt, DO   REFERRING PROVIDER:  Dorina Loving, PA-C   REFERRING DIAG: M54.50 (ICD-10-CM) - Acute midline low back pain without sciatica  THERAPY DIAG:  Other low back pain  Muscle weakness (generalized)  Abnormal posture  RATIONALE FOR EVALUATION AND TREATMENT: Rehabilitation  ONSET DATE: 2 weeks ago  NEXT MD VISIT:    SUBJECTIVE:  SUBJECTIVE STATEMENT: 01/04/24 EVAL CERVICAL PAIN:  Patient is reevaluated today for cervical pain.  He has a new order from his PCP for neck pain evaluate/treat.   He reports the pain started several weeks ago for no apparent reason.  He states the pain was on the L side of his cervical spine and top of the L shoulder/upper trap area.   He isn't sure what precipitated it.   Denies any trauma recently.  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   He has had off/on neck pain since that time, but usually gets better.   He states he is feeling better with the neck pain this week than he did last week.   Pain level is only minimal now.   He is still working as a ecologist and admits that he has a fair amount of stress which he feels may contribute to his neck pain.   He does a lot of sitting and driving with his job.   He denies any pain, numbness, tingling into the L arm.    EVAL LBP: 68 y/o patient referred to PT for LBP.   He is well known to us  from recent episodes of PT for this issue with the latest being about 2 months ago.  Patient states 2 weeks ago he started having severe R sided LBP with any movement.   He reports the pain was severe initially, but is now doing better with taking Celebrex.  States he can't recall any injury that would have precipitated this.  He states that he is currently having a lot of soreness now, but it is  not constant.  States feels it more with sit to stand transfers.  Played golf 3-4 weeks ago, but doesn't think this aggravated him.  Doing piriformis stretching and lying flat helps him some.   He has had a back injection many years ago, but nothing since then.  No MRI.  Old xray shows DDD, facet arthropathy.    He does have a long history of off/on back pain with difficulty playing golf at times, which he likes to do.   However he states he is not going to try to play anymore this year, and is going to try to focus on exercises to improve his back pain and prevent reinjury  PAIN: Are you having pain? Yes: NPRS scale: 5/10 now and worst over last week.    Pain location: central low back now Pain description: soreness most of the time; sharp at times Aggravating factors: lifting,  Relieving factors: lying flat  PERTINENT HISTORY:  DM-II, obesity, elevated BP, GERD, B patellofemoral pain syndrome, B hip pain, h/o R RTC tendinitis, chronic B LBP w/o sciatica, scoliosis, diabetic peripheral neuropathy   PRECAUTIONS: None  RED FLAGS: None  WEIGHT BEARING RESTRICTIONS: No  FALLS:  Has patient fallen in last 6 months? No  LIVING ENVIRONMENT: Lives with: lives with their family and lives with their spouse Lives in: House/apartment Stairs: Yes: Internal: 14 steps; on left going up and External: 1 steps; unknown Has following equipment at home: Single point cane and Environmental Consultant - 2 wheeled  OCCUPATION: pharmacist, hospital, mostly seated desk work and driving  PLOF: Independent  PATIENT GOALS: improve back pain and prevent further flareups/reinjuries   OBJECTIVE: (objective measures completed at initial evaluation unless otherwise dated)  DIAGNOSTIC FINDINGS:  EXAM: LUMBAR SPINE - 2-3 VIEW   COMPARISON:  None Available.   FINDINGS: Evaluation is limited due to body habitus.   Five lumbar type vertebra. There is no acute fracture or subluxation of the lumbar spine. There is  multilevel degenerative changes with disc space narrowing, endplate irregularity and spurring/osteophyte. Multilevel facet arthropathy. The visualized posterior elements appear intact. There is atherosclerotic calcification of the aorta. The soft tissues are grossly unremarkable.   IMPRESSION: No acute fracture or subluxation of the lumbar spine.     Electronically Signed   By: Vanetta Chou M.D.   On: 10/18/2021 20:41  PATIENT SURVEYS:  Modified Oswestry:  MODIFIED OSWESTRY DISABILITY SCALE  Date: 01/05/2024 Score  Pain intensity 2 =  Pain medication provides me with complete relief from pain.  2. Personal care (washing, dressing, etc.) 1 =  I can take care of myself normally, but it increases my pain.  3. Lifting 4 = I can lift only very light weights  4. Walking 1 = Pain prevents me from walking more than 1 mile.  5. Sitting 2 =  Pain prevents me from sitting more than 1 hour.  6. Standing 4 =  Pain prevents me from standing more than 10 minutes.  7. Sleeping 1 = I can sleep well only by using pain medication.  8. Social Life 1 =  My social life is normal, but it increases my level of pain.  9. Traveling 1 =  I can travel anywhere, but it increases my pain.  10. Employment/ Homemaking 2 = I can perform most of my homemaking/job duties, but pain prevents me from performing more physically stressful activities (eg, lifting, vacuuming).  Total 19/50   Interpretation of scores: Score Category Description  0-20% Minimal Disability The patient can cope with most living activities. Usually no treatment is indicated apart from advice on lifting, sitting and exercise  21-40% Moderate Disability The patient experiences more pain and difficulty with sitting, lifting and standing. Travel and social life are more difficult and they may be disabled from work. Personal care, sexual activity and sleeping are not grossly affected, and the patient can usually be managed by conservative means   41-60% Severe Disability Pain remains the main problem in this group, but activities of daily living are affected. These patients require a detailed investigation  61-80% Crippled Back pain impinges on all aspects of the patient's life. Positive intervention is required  81-100% Bed-bound These patients are either bed-bound or exaggerating their symptoms  Bluford FORBES Zoe DELENA Karon DELENA, et al. Surgery versus conservative management of stable thoracolumbar fracture: the PRESTO feasibility RCT. [place] ([place]): [employer]; 2021 Nov. [facility] Technology Assessment, No. 25.62.) Appendix 3, Oswestry Disability Index category descriptors. Available from: Findjewelers.cz  Minimally Clinically Important Difference (MCID) = 12.8%   NDI:  NECK DISABILITY INDEX  Date: 01/05/2024 Score  Pain intensity 3 = The pain is fairly severe at the moment  2. Personal care (washing, dressing, etc.) 1 =  I can look after myself normally but it causes extra pain  3. Lifting 3 = Pain prevents me from lifting heavy weights but I can manage light to medium   weights if they are conveniently positioned  4. Reading 2 =  I can read as much as I want with moderate pain in my neck  5. Headaches 2 =  I have moderate headaches, which come infrequently  6. Concentration 1 =  I can concentrate fully when I want to with slight difficulty   7. Work 1 =  I  can only do my usual work, but no more  8. Driving 1 =  I can drive my car as long as I want with slight pain in my neck  9. Sleeping 1 = My sleep is slightly disturbed (less than 1 hr sleepless)  10. Recreation 3 = I am able to engage in a few of my usual recreation activities because of pain in   my neck  Total 18/50 = 36%   Minimum Detectable Change (90% confidence): 5 points or 10% points   SCREENING FOR RED FLAGS: Bowel or bladder incontinence: No Spinal tumors: No Cauda equina syndrome: No Compression fracture: No Abdominal  aneurysm: No  COGNITION:  Overall cognitive status: Within functional limits for tasks assessed    SENSATION: WFL  POSTURE:  Has some Leftward scoliosis present;   increased lumbar extension/sway position   Supine:  Equal pelvic landmarks;  longer LLE at the malleolus by a small amount   PALPATION: Not specifically painful;   deep palpation of lumbar paraspinals and QL are nonpainful Lumbar posterior/anterior glides and rotational glides are non painful No palpation reproduces his pain  01/04/24:  neck eval:  TTP over the C4-7 transverse processes;  no palpable trigger points throughout L neck;  P/A vertebral glides are some restricted C4-7.  Side glides and rotational glides feel fairly normal  LUMBAR ROM:   Active  Eval  Flexion To knees; some end range pain  Extension 100%  Right lateral flexion To upper thigh;  stiff feeling  Left lateral flexion To upper thigh; stiff feeling  Right rotation 75%; stiff  Left rotation 100%  (Blank rows = not tested)  CERVICAL ROM: 01/04/24  Active ROM A/PROM (deg) eval  Flexion 90%  Extension 45%  Right lateral flexion 50%  Left lateral flexion 50%  Right rotation 80%  Left rotation 75%    No motion is especially painful   MUSCLE LENGTH: Hamstrings: Right SLR = 80 deg; Left SLR = 50 deg Hamstrings: very tight L, mild tight R ITB: NT Piriformis: a little tight BLE Hip flexors: not tight Quads: NT Heelcord: NT  LOWER EXTREMITY ROM:     Active  Right eval Left eval  Hip flexion    Hip extension    Hip abduction    Hip adduction    Hip internal rotation    Hip external rotation    Knee flexion    Knee extension    Ankle dorsiflexion    Ankle plantarflexion    Ankle inversion    Ankle eversion    (Blank rows = not tested)  LOWER EXTREMITY MMT:    MMT Right eval Left eval  Hip flexion 4+ 4  Hip extension 3+ 4-  Hip abduction 4- 4  Hip adduction    Hip internal rotation 4 4-  Hip external rotation 4+ 4   Knee flexion 4 4-  Knee extension 5 4+  Ankle dorsiflexion 5 5  Ankle plantarflexion 5 5  Ankle inversion    Ankle eversion     (Blank rows = not tested)  LUMBAR SPECIAL TESTS:  Straight leg raise test: Negative, Slump test: Negative, and Long sit test: Negative   Longer L malleolus but no change with long sit test;  Debby test is good flexibility, but increases his back pain for BLE (denies any SI pain)  CERVICAL SPECIAL TESTS: 01/04/24 Upper limb tension test (ULTT): Negative negative for median, radial, and ulnar nerves and unprovoked with cervical sidebending or rotation or both  FUNCTIONAL TESTS:  TBD if needed  GAIT: Distance walked: into clinic x 200' Assistive device utilized: None Level of assistance: Complete Independence Gait pattern: significant toe out BLE nearly contacting his other heel on swing phase of gait Comments: no limping noted   TODAY'S TREATMENT:  01/04/24 PT reevaluation/evaluation for cervical spine pain (see notations above and below for subjective notes, objective measures, assessment and goals for the cervical spine)  SELF CARE: Provided education on PT POC progression and initial HEP for cervical spine (see below for detail).   12/28/23 SELF CARE: Provided education on PT POC progression.   PATIENT EDUCATION:  Education details: PT eval findings, anticipated POC, and initial HEP  Person educated: Patient Education method: Explanation, Demonstration, Verbal cues, Tactile cues, Handouts, and MedBridgeGO app access provided Education comprehension: verbalized understanding, verbal cues required, tactile cues required, and needs further education  HOME EXERCISE PROGRAM: Access Code: [email or web address] URL: [email or web address] Date: 01/04/2024 Prepared by: Garnette Montclair  Exercises - Prone Hip Extension  - 1 x daily - 7 x weekly - 3 sets - 10 reps - Sidelying Hip Abduction  - 1 x daily - 7 x weekly - 3 sets - 10 reps - Side  Plank on Knees  - 1 x daily - 7 x weekly - 1 sets - 10 reps - Plank on Knees  - 1 x daily - 7 x weekly - 1 sets - 10 reps - Seated Hamstring Stretch  - 1 x daily - 7 x weekly - 1 sets - 2 reps - 1 min hold - Standard Plank  - 1 x daily - 7 x weekly - 1 sets - 10 reps - Supine Cervical Retraction with Towel  - 1 x daily - 7 x weekly - 1 sets - 10 reps - 3 sec hold - Supine Scapular Retraction  - 1 x daily - 7 x weekly - 1 sets - 10 reps - 3 sec hold - Supine Chest Stretch with Elbows Bent  - 1 x daily - 7 x weekly - 1 sets - 10 reps - 3 sec hold - Mid-Lower Cervical Extension SNAG with Strap  - 1 x daily - 7 x weekly - 1 sets - 10 reps - Seated Cervical Sidebending Stretch  - 1 x daily - 7 x weekly - 1 sets - 2 reps - 1 min hold  ASSESSMENT:  CLINICAL IMPRESSION: 01/04/24 NECK EVAL:  Patient is seen today for cervical spine pain and it is felt to be due at least in part to OA 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  The flaring up and improvement cycle is also somewhat consistent with OA pain.   He exhibits considerable cervical spine stiffness with looking up and likely has some foraminal crowding.  He has no recent imaging for the neck.   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  No crepitus is felt with cervical ROM.   He has decreased posterior/anterior vertebral glide mobility from C4-7.  There are no palpable trigger points and nerve tension tests are negative for the LUE.  He has no radicular symptoms in the LUE.   He demonstrates forward head posture with sloped shoulders and upper thoracic kyphosis.   Krzysztof would benefit from PT to address cervical ROM/stiffness, posture, and pain deficits.    He is agreeable to the POC  BACK EVAL:  Dominiq Fontaine is a 68 y.o. male who was referred to physical therapy for evaluation and treatment for acute  on chronic LBP   Patient reports onset of  pain beginning 2 weeks ago for no apparent reason.   However, he saw PCP and started celebrex and is  feeling better.    Pain is worse with sit to stand transfers and bending over/lifting and sitting or standing prolonged periods of time.  He spends a lot of time in the car and at his desk.  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  He has a slightly longer LLE and a little leftward leaning sway back posture.  Patient has deficits in lumbar ROM, hamstring LE flexibility, BLE strength, abnormal posture with some leftward lean, and residual pain which are interfering with ADLs and are impacting quality of life.  On Modified Oswestry patient scored 19/50 demonstrating 38% or moderate disability.  Jobe will benefit from skilled PT to address above deficits to improve mobility and activity tolerance with decreased pain interference.   OF note, he is also having neck pain and is going to speak with PCP about a referral to address the cervical spine as well.  We will look for this order  OBJECTIVE IMPAIRMENTS: difficulty walking, decreased ROM, decreased strength, postural dysfunction, and pain.   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   REHAB POTENTIAL: Good  CLINICAL DECISION MAKING: Evolving/moderate complexity  EVALUATION COMPLEXITY: Moderate   GOALS: Goals reviewed with patient? Yes  SHORT TERM GOALS: Target date: 01/25/2024   Patient will be independent with initial HEP to improve outcomes and carryover.  Baseline: 100% PT assist required for correct completion 01/04/24:  initial HEP for cervical pain initiated today Goal status: INITIAL   2.  Patient will report 25% improvement in low back pain to improve  QOL. Baseline: 5/10 Goal status: INITIAL  3.  Patient will report 50% subjective improvement in L sided cervical pain   Goal status:  INITIAL on 01/04/24  LONG TERM GOALS: Target date: 02/22/2024   Patient will be independent with ongoing/advanced HEP for self-management at home.  Baseline: no advanced HEP yet Goal status: INITIAL  2.  Patient will report 50-75% improvement in low back pain to improve QOL.  Baseline: 5/10 Goal status: INITIAL  3.  Patient to demonstrate ability to achieve and maintain good spinal alignment/posturing and body mechanics needed for daily activities. Baseline: leftward lean with sway back Goal status: IN PROGRESS  4.  Patient will demonstrate full pain free lumbar ROM to perform ADLs.   Baseline: Refer to above lumbar ROM table Goal status: INITIAL  5.  Patient will demonstrate improved BLE strength to >/= 5/5 for improved stability and ease of mobility. Baseline: Refer to above LE MMT table Goal status: INITIAL  6. Patient will report </= 20% on Modified Oswestry (MCID = 12%) to demonstrate improved functional ability with decreased pain interference. Baseline: 38% Goal status: INITIAL  7.  Patient will tolerate 30 min of (standing/sitting/walking) w/o increased pain to allow for  improved mobility and activity tolerance. Baseline: states pain begins after 10 min standing/walking Goal status: INITIAL   8.  Patient will demonstrate 60-80% cervical extension to be able to look up to do work around the house     Baseline:  see ROM tables above  Goal status:  INITIAL       9.  Patient will report >/= 75% improvement in neck pain    Goal status:  INITIAL PLAN:  PT FREQUENCY: 1-2x/week  PT DURATION: 8 weeks  PLANNED INTERVENTIONS: 97164- PT Re-evaluation, 97110-Therapeutic exercises, 97530- Therapeutic activity, 97112- Neuromuscular re-education, 97535- Self Care, 02859- Manual therapy, [id number]- Electrical stimulation (unattended), [id number]-  Ultrasound, 02987- Traction (mechanical), [id number]- Ionotophoresis 4mg /ml Dexamethasone , 79439 (1-2 muscles), 20561 (3+ muscles)- Dry Needling, Patient/Family education, Balance training, Taping, Joint mobilization, Spinal mobilization, Cryotherapy, and Moist heat  PLAN FOR NEXT SESSION: Will focus on cervical spine pain one day/week and low back pain one day/week  Cherylynn Liszewski, PT 01/05/2024, 8:05 AM

## 2024-01-05 ENCOUNTER — Ambulatory Visit (HOSPITAL_BASED_OUTPATIENT_CLINIC_OR_DEPARTMENT_OTHER): Admitting: Pulmonary Disease

## 2024-01-05 ENCOUNTER — Telehealth: Payer: Self-pay

## 2024-01-05 NOTE — Telephone Encounter (Signed)
 Called pt to notify him that his ozempic  is here but received no answer. Vm left and Placed rx in fridge

## 2024-01-06 ENCOUNTER — Encounter: Payer: Self-pay | Admitting: Rehabilitation

## 2024-01-06 ENCOUNTER — Ambulatory Visit: Admitting: Rehabilitation

## 2024-01-06 DIAGNOSIS — M5459 Other low back pain: Secondary | ICD-10-CM

## 2024-01-06 DIAGNOSIS — R293 Abnormal posture: Secondary | ICD-10-CM

## 2024-01-06 DIAGNOSIS — M6281 Muscle weakness (generalized): Secondary | ICD-10-CM

## 2024-01-06 NOTE — Therapy (Signed)
 OUTPATIENT PHYSICAL THERAPY CERVICAL EVALUATION   Patient Name: Noah Lane MRN: [id number] DOB:Dec 27, 1955, 68 y.o., male Today's Date: 01/06/2024  END OF SESSION:  PT End of Session - 01/06/24 0853     Visit Number 3    Date for Recertification  02/22/24    PT Start Time 0848    PT Stop Time 0935    PT Time Calculation (min) 47 min    Activity Tolerance Patient tolerated treatment well;No increased pain    Behavior During Therapy WFL for tasks assessed/performed          Past Medical History:  Diagnosis Date   Broken toe    Diabetes mellitus type 2 in obese    Mixed hyperlipidemia    Past Surgical History:  Procedure Laterality Date   COLON RESECTION N/A 04/27/2022   Procedure: LAPAROSCOPIC HAND ASSISTED RIGHT HEMI COLECTOMY, POSSIBLE OPEN;  Surgeon: Dasie Leonor CROME, MD;  Location: [facility];  Service: General;  Laterality: N/A;   COLONOSCOPY     HERNIA REPAIR  2012   Patient Active Problem List   Diagnosis Date Noted   Elevated blood pressure reading 05/04/2022   Colon adenocarcinoma (HCC) 05/04/2022   Gastroesophageal reflux disease 04/29/2022   Ileus, postoperative (HCC) 04/29/2022   Hypokalemia 04/28/2022   Hyperlipidemia 04/28/2022   Colonic obstruction (HCC) 04/25/2022   Hyponatremia 04/25/2022   OSA on CPAP 12/03/2021   Obesity (BMI 30.0-34.9) 12/03/2021   Mixed hyperlipidemia 10/20/2021   Strain of lumbar region 10/16/2021   Strain of calf muscle 07/22/2021   Patellofemoral pain syndrome of both knees 02/24/2021   Rotator cuff tendinitis, right 12/28/2019   Hip pain, bilateral 05/28/2019   Chronic bilateral low back pain without sciatica 05/28/2019   Type 2 diabetes mellitus with obesity 05/28/2019   Erectile dysfunction 05/28/2019   Pain and swelling of left knee 03/12/2019   Lateral epicondylitis, right elbow 03/15/2017   Diverticulosis of large intestine without hemorrhage 02/25/2014    PCP: Frann Mabel Mt, DO   REFERRING PROVIDER:  Dorina Loving, PA-C   REFERRING DIAG: M54.50 (ICD-10-CM) - Acute midline low back pain without sciatica  THERAPY DIAG:  Other low back pain  Muscle weakness (generalized)  Abnormal posture  RATIONALE FOR EVALUATION AND TREATMENT: Rehabilitation  ONSET DATE: 2 weeks ago  NEXT MD VISIT:    SUBJECTIVE:  SUBJECTIVE STATEMENT: Rates back pain today 3-4/10 R sided low back today.   States he is doing his HEP regularly.   01/04/24 EVAL CERVICAL PAIN:  Patient is reevaluated today for cervical pain.  He has a new order from his PCP for neck pain evaluate/treat.   He reports the pain started several weeks ago for no apparent reason.  He states the pain was on the L side of his cervical spine and top of the L shoulder/upper trap area.   He isn't sure what precipitated it.   Denies any trauma recently.  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   He has had off/on neck pain since that time, but usually gets better.   He states he is feeling better with the neck pain this week than he did last week.   Pain level is only minimal now.   He is still working as a ecologist and admits that he has a fair amount of stress which he feels may contribute to his neck pain.   He does a lot of sitting and driving with his job.   He denies any pain, numbness, tingling into the L arm.    EVAL LBP: 68 y/o patient referred to PT for LBP.   He is well known to us  from recent episodes of PT for this issue with the latest being about 2 months ago.  Patient states 2 weeks ago he started having severe R sided LBP with any movement.   He reports the pain was severe initially, but is now doing better with taking Celebrex.  States he can't recall any injury that would  have precipitated this.  He states that he is currently having a lot of soreness now, but it is not constant.  States feels it more with sit to stand transfers.  Played golf 3-4 weeks ago, but doesn't think this aggravated him.  Doing piriformis stretching and lying flat helps him some.   He has had a back injection many years ago, but nothing since then.  No MRI.  Old xray shows DDD, facet arthropathy.    He does have a long history of off/on back pain with difficulty playing golf at times, which he likes to do.   However he states he is not going to try to play anymore this year, and is going to try to focus on exercises to improve his back pain and prevent reinjury  PAIN: Are you having pain? Yes: NPRS scale: 5/10 now and worst over last week.    Pain location: central low back now Pain description: soreness most of the time; sharp at times Aggravating factors: lifting,  Relieving factors: lying flat  PERTINENT HISTORY:  DM-II, obesity, elevated BP, GERD, B patellofemoral pain syndrome, B hip pain, h/o R RTC tendinitis, chronic B LBP w/o sciatica, scoliosis, diabetic peripheral neuropathy   PRECAUTIONS: None  RED FLAGS: None  WEIGHT BEARING RESTRICTIONS: No  FALLS:  Has patient fallen in last 6 months? No  LIVING ENVIRONMENT: Lives with: lives with their family and lives with their spouse Lives in: House/apartment Stairs: Yes: Internal: 14 steps; on left going up and External: 1 steps; unknown Has following equipment at home: Single point cane and Walker - 2 wheeled  OCCUPATION: pharmacist, hospital, mostly seated desk work and driving  PLOF: Independent  PATIENT GOALS: improve back pain and prevent further flareups/reinjuries   OBJECTIVE: (objective measures  completed at initial evaluation unless otherwise dated)  DIAGNOSTIC FINDINGS:  EXAM: LUMBAR SPINE - 2-3 VIEW   COMPARISON:  None Available.   FINDINGS: Evaluation is limited due to body habitus.   Five  lumbar type vertebra. There is no acute fracture or subluxation of the lumbar spine. There is multilevel degenerative changes with disc space narrowing, endplate irregularity and spurring/osteophyte. Multilevel facet arthropathy. The visualized posterior elements appear intact. There is atherosclerotic calcification of the aorta. The soft tissues are grossly unremarkable.   IMPRESSION: No acute fracture or subluxation of the lumbar spine.     Electronically Signed   By: Vanetta Chou M.D.   On: 10/18/2021 20:41  PATIENT SURVEYS:  Modified Oswestry:  MODIFIED OSWESTRY DISABILITY SCALE  Date: 01/06/2024 Score  Pain intensity 2 =  Pain medication provides me with complete relief from pain.  2. Personal care (washing, dressing, etc.) 1 =  I can take care of myself normally, but it increases my pain.  3. Lifting 4 = I can lift only very light weights  4. Walking 1 = Pain prevents me from walking more than 1 mile.  5. Sitting 2 =  Pain prevents me from sitting more than 1 hour.  6. Standing 4 =  Pain prevents me from standing more than 10 minutes.  7. Sleeping 1 = I can sleep well only by using pain medication.  8. Social Life 1 =  My social life is normal, but it increases my level of pain.  9. Traveling 1 =  I can travel anywhere, but it increases my pain.  10. Employment/ Homemaking 2 = I can perform most of my homemaking/job duties, but pain prevents me from performing more physically stressful activities (eg, lifting, vacuuming).  Total 19/50   Interpretation of scores: Score Category Description  0-20% Minimal Disability The patient can cope with most living activities. Usually no treatment is indicated apart from advice on lifting, sitting and exercise  21-40% Moderate Disability The patient experiences more pain and difficulty with sitting, lifting and standing. Travel and social life are more difficult and they may be disabled from work. Personal care, sexual activity and  sleeping are not grossly affected, and the patient can usually be managed by conservative means  41-60% Severe Disability Pain remains the main problem in this group, but activities of daily living are affected. These patients require a detailed investigation  61-80% Crippled Back pain impinges on all aspects of the patient's life. Positive intervention is required  81-100% Bed-bound These patients are either bed-bound or exaggerating their symptoms  Bluford FORBES Zoe DELENA Karon DELENA, et al. Surgery versus conservative management of stable thoracolumbar fracture: the PRESTO feasibility RCT. [place] ([place]): [employer]; 2021 Nov. [facility] Technology Assessment, No. 25.62.) Appendix 3, Oswestry Disability Index category descriptors. Available from: Findjewelers.cz  Minimally Clinically Important Difference (MCID) = 12.8%   NDI:  NECK DISABILITY INDEX  Date: 01/06/2024 Score  Pain intensity 3 = The pain is fairly severe at the moment  2. Personal care (washing, dressing, etc.) 1 =  I can look after myself normally but it causes extra pain  3. Lifting 3 = Pain prevents me from lifting heavy weights but I can manage light to medium   weights if they are conveniently positioned  4. Reading 2 =  I can read as much as I want with moderate pain in my neck  5. Headaches 2 =  I have moderate headaches, which come infrequently  6. Concentration 1 =  I can concentrate fully when I want to with slight difficulty   7. Work 1 =  I can only do my usual work, but no more  8. Driving 1 =  I can drive my car as long as I want with slight pain in my neck  9. Sleeping 1 = My sleep is slightly disturbed (less than 1 hr sleepless)  10. Recreation 3 = I am able to engage in a few of my usual recreation activities because of pain in   my neck  Total 18/50 = 36%   Minimum Detectable Change (90% confidence): 5 points or 10% points   SCREENING FOR RED FLAGS: Bowel or bladder  incontinence: No Spinal tumors: No Cauda equina syndrome: No Compression fracture: No Abdominal aneurysm: No  COGNITION:  Overall cognitive status: Within functional limits for tasks assessed    SENSATION: WFL  POSTURE:  Has some Leftward scoliosis present;   increased lumbar extension/sway position   Supine:  Equal pelvic landmarks;  longer LLE at the malleolus by a small amount   PALPATION: Not specifically painful;   deep palpation of lumbar paraspinals and QL are nonpainful Lumbar posterior/anterior glides and rotational glides are non painful No palpation reproduces his pain  01/04/24:  neck eval:  TTP over the C4-7 transverse processes;  no palpable trigger points throughout L neck;  P/A vertebral glides are some restricted C4-7.  Side glides and rotational glides feel fairly normal  LUMBAR ROM:   Active  Eval  Flexion To knees; some end range pain  Extension 100%  Right lateral flexion To upper thigh;  stiff feeling  Left lateral flexion To upper thigh; stiff feeling  Right rotation 75%; stiff  Left rotation 100%  (Blank rows = not tested)  CERVICAL ROM: 01/04/24  Active ROM A/PROM (deg) eval  Flexion 90%  Extension 45%  Right lateral flexion 50%  Left lateral flexion 50%  Right rotation 80%  Left rotation 75%    No motion is especially painful   MUSCLE LENGTH: Hamstrings: Right SLR = 80 deg; Left SLR = 50 deg Hamstrings: very tight L, mild tight R ITB: NT Piriformis: a little tight BLE Hip flexors: not tight Quads: NT Heelcord: NT  LOWER EXTREMITY ROM:     Active  Right eval Left eval  Hip flexion    Hip extension    Hip abduction    Hip adduction    Hip internal rotation    Hip external rotation    Knee flexion    Knee extension    Ankle dorsiflexion    Ankle plantarflexion    Ankle inversion    Ankle eversion    (Blank rows = not tested)  LOWER EXTREMITY MMT:    MMT Right eval Left eval  Hip flexion 4+ 4  Hip extension 3+ 4-   Hip abduction 4- 4  Hip adduction    Hip internal rotation 4 4-  Hip external rotation 4+ 4  Knee flexion 4 4-  Knee extension 5 4+  Ankle dorsiflexion 5 5  Ankle plantarflexion 5 5  Ankle inversion    Ankle eversion     (Blank rows = not tested)  LUMBAR SPECIAL TESTS:  Straight leg raise test: Negative, Slump test: Negative, and Long sit test: Negative   Longer L malleolus but no change with long sit test;  Debby test is good flexibility, but increases his back pain for BLE (denies any SI pain)  CERVICAL SPECIAL TESTS: 01/04/24 Upper limb tension  test (ULTT): Negative negative for median, radial, and ulnar nerves and unprovoked with cervical sidebending or rotation or both   FUNCTIONAL TESTS:  TBD if needed  GAIT: Distance walked: into clinic x 200' Assistive device utilized: None Level of assistance: Complete Independence Gait pattern: significant toe out BLE nearly contacting his other heel on swing phase of gait Comments: no limping noted   TODAY'S TREATMENT:  01/06/24 THERAPEUTIC EXERCISE: To improve strength and endurance.  Demonstration, verbal and tactile cues throughout for technique. Elliptical L0 x 6' F  THERAPEUTIC ACTIVITIES: To improve functional performance.  Demonstration, verbal and tactile cues throughout for technique. Seated hamstring stretch x 1'  Seated FABER piriformis x 1' BLE Seated cross over piriformis x 1' BLE SL hip abd x 30 BLE Prone plank x 10 SL plank from knees x 10 B  NEUROMUSCULAR RE-EDUCATION: To improve coordination, kinesthesia, posture, and proprioception. Seated 75 swiss ball Palloff press while holding knee extended blue TB x 2/10 BLE each direction Quadruped fire hydrant w/ TA and lateral YTB pull from PT x 2/10 BLE each direction  MANUAL THERAPY: To promote reduced pain utilizing percussion massage with massage gun. R lumbar paraspinals and quadratus lumborum  percussion massage for pain relief  01/04/24 PT  reevaluation/evaluation for cervical spine pain (see notations above and below for subjective notes, objective measures, assessment and goals for the cervical spine)  SELF CARE: Provided education on PT POC progression and initial HEP for cervical spine (see below for detail).   12/28/23 SELF CARE: Provided education on PT POC progression.   PATIENT EDUCATION:  Education details: HEP review and HEP update  Person educated: Patient Education method: Explanation, Demonstration, Verbal cues, Tactile cues, Handouts, and MedBridgeGO app access provided Education comprehension: verbalized understanding, verbal cues required, tactile cues required, and needs further education  HOME EXERCISE PROGRAM: Access Code: [email or web address] URL: [email or web address] Date: 01/04/2024 Prepared by: Garnette Montclair  Exercises - Prone Hip Extension  - 1 x daily - 7 x weekly - 3 sets - 10 reps - Sidelying Hip Abduction  - 1 x daily - 7 x weekly - 3 sets - 10 reps - Side Plank on Knees  - 1 x daily - 7 x weekly - 1 sets - 10 reps - Plank on Knees  - 1 x daily - 7 x weekly - 1 sets - 10 reps - Seated Hamstring Stretch  - 1 x daily - 7 x weekly - 1 sets - 2 reps - 1 min hold - Standard Plank  - 1 x daily - 7 x weekly - 1 sets - 10 reps - Supine Cervical Retraction with Towel  - 1 x daily - 7 x weekly - 1 sets - 10 reps - 3 sec hold - Supine Scapular Retraction  - 1 x daily - 7 x weekly - 1 sets - 10 reps - 3 sec hold - Supine Chest Stretch with Elbows Bent  - 1 x daily - 7 x weekly - 1 sets - 10 reps - 3 sec hold - Mid-Lower Cervical Extension SNAG with Strap  - 1 x daily - 7 x weekly - 1 sets - 10 reps - Seated Cervical Sidebending Stretch  - 1 x daily - 7 x weekly - 1 sets - 2 reps - 1 min hold  ASSESSMENT:  CLINICAL IMPRESSION: Focused on low back treatment today.   Will do cervical pain treatment next visit.  Patient is able to progress with higher level core stabilization today.  He  demonstrates difficulty with swiss ball and quadruped exercises.  Planks are improving, but R side planks are the most difficult for him.  He still exhibits core and hip weakness with challenging activities.   PT remains necessary for strength, posture, HEP deficits.  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  The flaring up and improvement cycle is also somewhat consistent with OA pain.   He exhibits considerable cervical spine stiffness with looking up and likely has some foraminal crowding.  He has no recent imaging for the neck.   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  No crepitus is felt with cervical ROM.   He has decreased posterior/anterior vertebral glide mobility from C4-7.  There are no palpable trigger points and nerve tension tests are negative for the LUE.  He has no radicular symptoms in the LUE.   He demonstrates forward head posture with sloped shoulders and upper thoracic kyphosis.   Tanmay would benefit from PT to address cervical ROM/stiffness, posture, and pain deficits.    He is agreeable to the POC  BACK EVAL:  Millie Shorb is a 68 y.o. male who was referred to physical therapy for evaluation and treatment for acute on chronic LBP   Patient reports onset of  pain beginning 2 weeks ago for no apparent reason.   However, he saw PCP and started celebrex and is feeling better.    Pain is worse with sit to stand transfers and bending over/lifting and sitting or standing prolonged periods of time.  He spends a lot of time in the car and at his desk.  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  He has a slightly longer LLE and a little leftward leaning sway back posture.  Patient has deficits in lumbar ROM, hamstring LE flexibility, BLE strength, abnormal posture with some leftward lean, and residual pain which are interfering with ADLs and are  impacting quality of life.  On Modified Oswestry patient scored 19/50 demonstrating 38% or moderate disability.  Pratt will benefit from skilled PT to address above deficits to improve mobility and activity tolerance with decreased pain interference.   OF note, he is also having neck pain and is going to speak with PCP about a referral to address the cervical spine as well.  We will look for this order  OBJECTIVE IMPAIRMENTS: difficulty walking, decreased ROM, decreased strength, postural dysfunction, and pain.   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   REHAB POTENTIAL: Good  CLINICAL DECISION MAKING: Evolving/moderate complexity  EVALUATION COMPLEXITY: Moderate   GOALS: Goals reviewed with patient? Yes  SHORT TERM GOALS: Target date: 01/25/2024   Patient will be independent with initial HEP to improve outcomes and carryover.  Baseline: 100% PT assist required for correct completion 01/04/24:  initial HEP for cervical pain initiated today Goal status: INITIAL   2.  Patient will report 25% improvement in low back pain to improve QOL. Baseline: 5/10 Goal status: INITIAL  3.  Patient will report 50% subjective improvement in L sided  cervical pain   Goal status:  INITIAL on 01/04/24  LONG TERM GOALS: Target date: 02/22/2024   Patient will be independent with ongoing/advanced HEP for self-management at home.  Baseline: no advanced HEP yet Goal status: INITIAL  2.  Patient will report 50-75% improvement in low back pain to improve QOL.  Baseline: 5/10 Goal status: INITIAL  3.  Patient to demonstrate ability to  achieve and maintain good spinal alignment/posturing and body mechanics needed for daily activities. Baseline: leftward lean with sway back Goal status: IN PROGRESS  4.  Patient will demonstrate full pain free lumbar ROM to perform ADLs.   Baseline: Refer to above lumbar ROM table Goal status: INITIAL  5.  Patient will demonstrate improved BLE strength to >/= 5/5 for improved stability and ease of mobility. Baseline: Refer to above LE MMT table Goal status: INITIAL  6. Patient will report </= 20% on Modified Oswestry (MCID = 12%) to demonstrate improved functional ability with decreased pain interference. Baseline: 38% Goal status: INITIAL  7.  Patient will tolerate 30 min of (standing/sitting/walking) w/o increased pain to allow for  improved mobility and activity tolerance. Baseline: states pain begins after 10 min standing/walking Goal status: INITIAL   8.  Patient will demonstrate 60-80% cervical extension to be able to look up to do work around the house     Baseline:  see ROM tables above   Goal status:  INITIAL       9.  Patient will report >/= 75% improvement in neck pain    Goal status:  INITIAL PLAN:  PT FREQUENCY: 1-2x/week  PT DURATION: 8 weeks  PLANNED INTERVENTIONS: 97164- PT Re-evaluation, 97110-Therapeutic exercises, 97530- Therapeutic activity, [id number]- Neuromuscular re-education, 97535- Self Care, 02859- Manual therapy, [id number]- Electrical stimulation (unattended), [id number]- Ultrasound, 02987- Traction (mechanical), [id number]- Ionotophoresis 4mg /ml Dexamethasone , 79439 (1-2 muscles), 20561 (3+ muscles)- Dry Needling, Patient/Family education, Balance training, Taping, Joint mobilization, Spinal mobilization, Cryotherapy, and Moist heat  PLAN FOR NEXT SESSION:  Address Cervical pain next visit.   Will focus on cervical spine pain one day/week and low back pain one day/week;   CERVICAL treatment focusing on flexibility of cervial and thoracic vertebral extension and postural  strengthening.   Lumbar treatment focusing on core and lumbar stabilization w/ swiss ball, tall kneel positions and hip abductors and extensors.    Alven Alverio, PT 01/06/2024, 9:52 AM

## 2024-01-09 ENCOUNTER — Ambulatory Visit (INDEPENDENT_AMBULATORY_CARE_PROVIDER_SITE_OTHER)

## 2024-01-09 ENCOUNTER — Encounter (HOSPITAL_BASED_OUTPATIENT_CLINIC_OR_DEPARTMENT_OTHER): Payer: Self-pay

## 2024-01-09 VITALS — BP 160/74 | HR 67

## 2024-01-09 DIAGNOSIS — G4733 Obstructive sleep apnea (adult) (pediatric): Secondary | ICD-10-CM

## 2024-01-09 NOTE — Patient Instructions (Signed)
 New CPAP order placed today.    Continue wearing CPAP nightly; goal of at least 4-6 hours.  Plan for follow up in 31-90 days for compliance download review.

## 2024-01-09 NOTE — Progress Notes (Signed)
 @Patient  ID: Noah Lane, male    DOB: [birth year], 68 y.o.   MRN: [id number]  No chief complaint on file.   Referring provider: Dorina Loving, PA-C  HPI: Discussed the use of AI scribe software for clinical note transcription with the patient, who gave verbal consent to proceed.  History of Present Illness Noah Lane is a 68 year old male with obstructive sleep apnea who presents for evaluation of his CPAP machine.  He has been using a CPAP machine for the past seven to eight years to manage his obstructive sleep apnea. Approximately one month ago, his current [employer] CPAP unit began signaling that the motor life was past due. This unit was replaced five to seven years ago by a medical equipment company in [place] . He reports that he has not used any ozone cleaning devices, opting instead for soap and water cleaning methods.  He reports wearing his CPAP every night for at least four to six hours. His compliance report shows his events per hour are reduced to three. He previously tried a materials engineer covered by health insurance, but it did not prevent snoring and was considered only as an emergency device.  He recalls his [place] sleep study [place] in [place] , where he underwent a split night study. The first half was for diagnosis, and the second half was for treatment, which significantly improved his sleep quality.     12/03/2021: Today - sleep consult Patient presents today for sleep consult referred by Dr. Frann.  He has had a diagnosis of sleep apnea for close to 10 years.  He has been on CPAP since 2014.  Original study was done at [facility] clinic.  Sleeps with his machine every night.  His current machine is about 50 to 70 years old.  He would like to see if he could get a new one.  He also is interested in a smaller one that would be better for travel. He has no concerns or complaints regarding his CPAP for his sleep today.  He wakes in the morning feeling  well rested.  Excellent compliance and cannot sleep without his CPAP.  No breakthrough snoring, excessive daytime fatigue, morning headache, drowsy driving, sleep parasomnia/paralysis.  No history of narcolepsy or cataplexy.  He has tried an oral appliance in the past which did not do much for him.  He still had snoring with it. He goes to bed between 930 and 10:30 PM.  Falls asleep within 1520 minutes.  Does not take any sleep aids.  Rarely wakes up throughout the night.  Officially gets a bed in the morning around 530 to 6 AM.  His weight has been up and down 20 pounds over the last 2 years.  He has a history of diabetes, well controlled on metformin .  No history of stroke or cardiac problems. He is a former smoker, quit 30 years ago.  Drinks 3 glasses of wine a week.  He lives at home with his spouse.  Works as a water engineer.  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  Past family history of father and mother with heart disease.   TEST/EVENTS:  Sleep apnea AHI 16  05/2012: CPAP titration 11 cmH2O; weight 125 kg >> recommended 9 cmH2O  Compliance download:  AHI 3.4 events/hr; acceptable leak profile No Known Allergies  Immunization History  Administered Date(s) Administered   Fluad Quad(high Dose 65+) 01/06/2021   INFLUENZA, HIGH DOSE SEASONAL PF 12/12/2023   Influenza,inj,Quad  PF,6+ Mos 11/22/2016   Influenza-Unspecified 01/24/2022   PFIZER(Purple Top)SARS-COV-2 Vaccination 03/22/2019, 04/12/2019, 12/06/2019, 01/24/2022   PNEUMOCOCCAL CONJUGATE-20 01/06/2021   PPD Test 05/21/2020   Pfizer Covid-19 Vaccine Bivalent Booster 89yrs & up 12/08/2020    Past Medical History:  Diagnosis Date   Broken toe    Diabetes mellitus type 2 in obese    Mixed hyperlipidemia     Tobacco History: Social History   Tobacco Use  Smoking Status Former   Current packs/day: 0.00   Average packs/day: 0.5 packs/day for 15.0 years (7.5 ttl pk-yrs)   Types: Cigarettes   Start  date: 62   Quit date: 1993   Years since quitting: 32.8  Smokeless Tobacco Never   Counseling given: Not Answered   Outpatient Medications Prior to Visit  Medication Sig Dispense Refill   atorvastatin  (LIPITOR) 20 MG tablet TAKE 1 TABLET BY MOUTH DAILY 90 tablet 3   celecoxib (CELEBREX) 200 MG capsule Take 1 capsule (200 mg total) by mouth daily. 10 capsule 0   ciclopirox  (PENLAC ) 8 % solution Apply topically at bedtime. Apply over nail and surrounding skin. Apply daily over previous coat. After seven (7) days, may remove with alcohol and continue cycle. 6.6 mL 0   Continuous Glucose Receiver (FREESTYLE LIBRE 2 READER) DEVI Change every 14 days 1 each 5   metFORMIN  (GLUCOPHAGE -XR) 500 MG 24 hr tablet TAKE 1 TABLET BY MOUTH DAILY WITH BREAKFAST 88 tablet 1   olmesartan  (BENICAR ) 20 MG tablet TAKE 1 TABLET BY MOUTH DAILY 90 tablet 1   PRESCRIPTION MEDICATION Inject 4 mLs as directed as needed (erectile disfunction). prostaglandin     Semaglutide , 1 MG/DOSE, 4 MG/3ML SOPN Inject 1 mg as directed once a week. 3 mL 5   No facility-administered medications prior to visit.     Review of Systems:   Constitutional:   No  weight loss, night sweats,  Fevers, chills, fatigue, or  lassitude.  HEENT:   No headaches,  Difficulty swallowing,  Tooth/dental problems, or  Sore throat,                No sneezing, itching, ear ache, nasal congestion, post nasal drip,   CV:  No chest pain,  Orthopnea, PND, swelling in lower extremities, anasarca, dizziness, palpitations, syncope.   GI  No heartburn, indigestion, abdominal pain, nausea, vomiting, diarrhea, change in bowel habits, loss of appetite, bloody stools.   Resp: No shortness of breath with exertion or at rest.  No excess mucus, no productive cough,  No non-productive cough,  No coughing up of blood.  No change in color of mucus.  No wheezing.  No chest wall deformity  Skin: no rash or lesions.  GU: no dysuria, change in color of urine, no  urgency or frequency.  No flank pain, no hematuria   MS:  No joint pain or swelling.  No decreased range of motion.  No back pain.    Physical Exam  BP (!) 160/74   Pulse 67   SpO2 97%   GEN: A/Ox3; pleasant , NAD, well nourished    HEENT:  [place]/AT,  EACs-clear, TMs-wnl, NOSE-clear, THROAT-clear, no lesions, no postnasal drip or exudate noted.   NECK:  Supple w/ fair ROM; no JVD; normal carotid impulses w/o bruits; no thyromegaly or nodules palpated; no lymphadenopathy.    RESP  Clear  P & A; w/o, wheezes/ rales/ or rhonchi. no accessory muscle use, no dullness to percussion  CARD:  RRR, no m/r/g, no peripheral edema,  pulses intact, no cyanosis or clubbing.  GI:   Soft & nt; nml bowel sounds; no organomegaly or masses detected.   Musco: Warm bil, no deformities or joint swelling noted.   Neuro: alert, no focal deficits noted.    Skin: Warm, no lesions or rashes    Lab Results:  CBC    Component Value Date/Time   WBC 8.6 05/17/2023 1313   RBC 5.06 05/17/2023 1313   HGB 14.9 05/17/2023 1313   HGB 14.2 08/26/2022 1400   HCT 44.1 05/17/2023 1313   PLT 170.0 05/17/2023 1313   PLT 172 08/26/2022 1400   MCV 87.1 05/17/2023 1313   MCH 27.9 08/26/2022 1400   MCHC 33.8 05/17/2023 1313   RDW 14.1 05/17/2023 1313   LYMPHSABS 1.9 08/26/2022 1400   MONOABS 0.8 08/26/2022 1400   EOSABS 0.2 08/26/2022 1400   BASOSABS 0.1 08/26/2022 1400    BMET    Component Value Date/Time   NA 137 08/19/2023 0815   K 4.3 08/19/2023 0815   CL 102 08/19/2023 0815   CO2 27 08/19/2023 0815   GLUCOSE 127 (H) 08/19/2023 0815   BUN 23 08/19/2023 0815   CREATININE 1.16 08/19/2023 0815   CREATININE 1.26 (H) 08/26/2022 1400   CREATININE 1.11 05/23/2020 1628   CALCIUM  9.2 08/19/2023 0815   GFRNONAA >60 08/26/2022 1400    BNP No results found for: BNP  ProBNP No results found for: PROBNP  Imaging: No results found.  Administration History     None           No data to  display          No results found for: NITRICOXIDE   Assessment & Plan:   Assessment & Plan OSA on CPAP  Assessment and Plan Assessment & Plan Obstructive sleep apnea managed with CPAP therapy Obstructive sleep apnea well-controlled with CPAP. Current machine malfunctioning. Excellent adherence and control with CPAP. - Ordered new CPAP machine. - Arranged local company setup and configuration. - Coordinated with respiratory therapist for setup and adjustments.    Return in about 8 weeks (around 03/05/2024) for compliance download.  Candis Dandy, PA-C 01/09/2024

## 2024-01-11 ENCOUNTER — Encounter: Payer: Self-pay | Admitting: Rehabilitation

## 2024-01-11 ENCOUNTER — Ambulatory Visit: Admitting: Rehabilitation

## 2024-01-11 DIAGNOSIS — R293 Abnormal posture: Secondary | ICD-10-CM

## 2024-01-11 DIAGNOSIS — M542 Cervicalgia: Secondary | ICD-10-CM

## 2024-01-11 DIAGNOSIS — M5459 Other low back pain: Secondary | ICD-10-CM

## 2024-01-11 DIAGNOSIS — M6281 Muscle weakness (generalized): Secondary | ICD-10-CM

## 2024-01-11 NOTE — Therapy (Signed)
 OUTPATIENT PHYSICAL THERAPY CERVICAL EVALUATION   Patient Name: Noah Lane MRN: [id number] DOB:11-17-1955, 68 y.o., male Today's Date: 01/11/2024  END OF SESSION:  PT End of Session - 01/11/24 0809     Visit Number 4    Date for Recertification  02/22/24    PT Start Time 0807   7 minutes late   PT Stop Time 0848    PT Time Calculation (min) 41 min    Activity Tolerance Patient tolerated treatment well;No increased pain    Behavior During Therapy WFL for tasks assessed/performed          Past Medical History:  Diagnosis Date   Broken toe    Diabetes mellitus type 2 in obese    Mixed hyperlipidemia    Past Surgical History:  Procedure Laterality Date   COLON RESECTION N/A 04/27/2022   Procedure: LAPAROSCOPIC HAND ASSISTED RIGHT HEMI COLECTOMY, POSSIBLE OPEN;  Surgeon: Dasie Leonor CROME, MD;  Location: [facility];  Service: General;  Laterality: N/A;   COLONOSCOPY     HERNIA REPAIR  2012   Patient Active Problem List   Diagnosis Date Noted   Elevated blood pressure reading 05/04/2022   Colon adenocarcinoma (HCC) 05/04/2022   Gastroesophageal reflux disease 04/29/2022   Ileus, postoperative (HCC) 04/29/2022   Hypokalemia 04/28/2022   Hyperlipidemia 04/28/2022   Colonic obstruction (HCC) 04/25/2022   Hyponatremia 04/25/2022   OSA on CPAP 12/03/2021   Obesity (BMI 30.0-34.9) 12/03/2021   Mixed hyperlipidemia 10/20/2021   Strain of lumbar region 10/16/2021   Strain of calf muscle 07/22/2021   Patellofemoral pain syndrome of both knees 02/24/2021   Rotator cuff tendinitis, right 12/28/2019   Hip pain, bilateral 05/28/2019   Chronic bilateral low back pain without sciatica 05/28/2019   Type 2 diabetes mellitus with obesity 05/28/2019   Erectile dysfunction 05/28/2019   Pain and swelling of left knee 03/12/2019   Lateral epicondylitis, right elbow 03/15/2017   Diverticulosis of large intestine without hemorrhage 02/25/2014    PCP: Frann Mabel Mt, DO    REFERRING PROVIDER: Dorina Loving, PA-C   REFERRING DIAG: M54.50 (ICD-10-CM) - Acute midline low back pain without sciatica  THERAPY DIAG:  Other low back pain  Muscle weakness (generalized)  Abnormal posture  Neck pain  RATIONALE FOR EVALUATION AND TREATMENT: Rehabilitation  ONSET DATE: 2 weeks ago  NEXT MD VISIT:    SUBJECTIVE:  SUBJECTIVE STATEMENT: Patient reports tripped over his feet yesterday and almost fell, but caught himself.   He states it made his back hurt.   Wants to focus on his neck pain today though.    Rates neck pain 2/10.  States pain is all on the L side of the neck from base of occiput to C7 but focused around the C4-6 transverse processes  01/04/24 EVAL CERVICAL PAIN:  Patient is reevaluated today for cervical pain.  He has a new order from his PCP for neck pain evaluate/treat.   He reports the pain started several weeks ago for no apparent reason.  He states the pain was on the L side of his cervical spine and top of the L shoulder/upper trap area.   He isn't sure what precipitated it.   Denies any trauma recently.  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   He has had off/on neck pain since that time, but usually gets better.   He states he is feeling better with the neck pain this week than he did last week.   Pain level is only minimal now.   He is still working as a ecologist and admits that he has a fair amount of stress which he feels may contribute to his neck pain.   He does a lot of sitting and driving with his job.   He denies any pain, numbness, tingling into the L arm.    EVAL LBP: 67 y/o patient referred to PT for LBP.   He is well known to us  from recent episodes of PT for this issue with  the latest being about 2 months ago.  Patient states 2 weeks ago he started having severe R sided LBP with any movement.   He reports the pain was severe initially, but is now doing better with taking Celebrex.  States he can't recall any injury that would have precipitated this.  He states that he is currently having a lot of soreness now, but it is not constant.  States feels it more with sit to stand transfers.  Played golf 3-4 weeks ago, but doesn't think this aggravated him.  Doing piriformis stretching and lying flat helps him some.   He has had a back injection many years ago, but nothing since then.  No MRI.  Old xray shows DDD, facet arthropathy.    He does have a long history of off/on back pain with difficulty playing golf at times, which he likes to do.   However he states he is not going to try to play anymore this year, and is going to try to focus on exercises to improve his back pain and prevent reinjury  PAIN: Are you having pain? Yes: NPRS scale: 5/10 now and worst over last week.    Pain location: central low back now Pain description: soreness most of the time; sharp at times Aggravating factors: lifting,  Relieving factors: lying flat  PERTINENT HISTORY:  DM-II, obesity, elevated BP, GERD, B patellofemoral pain syndrome, B hip pain, h/o R RTC tendinitis, chronic B LBP w/o sciatica, scoliosis, diabetic peripheral neuropathy   PRECAUTIONS: None  RED FLAGS: None  WEIGHT BEARING RESTRICTIONS: No  FALLS:  Has patient fallen in last 6 months? No  LIVING ENVIRONMENT: Lives with: lives with their family and lives with their spouse Lives in: House/apartment Stairs: Yes: Internal: 14 steps; on left going up and External: 1  steps; unknown Has following equipment at home: Single point cane and Walker - 2 wheeled  OCCUPATION: pharmacist, hospital, mostly seated desk work and driving  PLOF: Independent  PATIENT GOALS: improve back pain and prevent further  flareups/reinjuries   OBJECTIVE: (objective measures completed at initial evaluation unless otherwise dated)  DIAGNOSTIC FINDINGS:  EXAM: LUMBAR SPINE - 2-3 VIEW   COMPARISON:  None Available.   FINDINGS: Evaluation is limited due to body habitus.   Five lumbar type vertebra. There is no acute fracture or subluxation of the lumbar spine. There is multilevel degenerative changes with disc space narrowing, endplate irregularity and spurring/osteophyte. Multilevel facet arthropathy. The visualized posterior elements appear intact. There is atherosclerotic calcification of the aorta. The soft tissues are grossly unremarkable.   IMPRESSION: No acute fracture or subluxation of the lumbar spine.     Electronically Signed   By: Vanetta Chou M.D.   On: 10/18/2021 20:41  PATIENT SURVEYS:  Modified Oswestry:  MODIFIED OSWESTRY DISABILITY SCALE  Date: 01/11/2024 Score  Pain intensity 2 =  Pain medication provides me with complete relief from pain.  2. Personal care (washing, dressing, etc.) 1 =  I can take care of myself normally, but it increases my pain.  3. Lifting 4 = I can lift only very light weights  4. Walking 1 = Pain prevents me from walking more than 1 mile.  5. Sitting 2 =  Pain prevents me from sitting more than 1 hour.  6. Standing 4 =  Pain prevents me from standing more than 10 minutes.  7. Sleeping 1 = I can sleep well only by using pain medication.  8. Social Life 1 =  My social life is normal, but it increases my level of pain.  9. Traveling 1 =  I can travel anywhere, but it increases my pain.  10. Employment/ Homemaking 2 = I can perform most of my homemaking/job duties, but pain prevents me from performing more physically stressful activities (eg, lifting, vacuuming).  Total 19/50   Interpretation of scores: Score Category Description  0-20% Minimal Disability The patient can cope with most living activities. Usually no treatment is indicated apart from  advice on lifting, sitting and exercise  21-40% Moderate Disability The patient experiences more pain and difficulty with sitting, lifting and standing. Travel and social life are more difficult and they may be disabled from work. Personal care, sexual activity and sleeping are not grossly affected, and the patient can usually be managed by conservative means  41-60% Severe Disability Pain remains the main problem in this group, but activities of daily living are affected. These patients require a detailed investigation  61-80% Crippled Back pain impinges on all aspects of the patient's life. Positive intervention is required  81-100% Bed-bound These patients are either bed-bound or exaggerating their symptoms  Bluford FORBES Zoe DELENA Karon DELENA, et al. Surgery versus conservative management of stable thoracolumbar fracture: the PRESTO feasibility RCT. [place] ([place]): [employer]; 2021 Nov. [facility] Technology Assessment, No. 25.62.) Appendix 3, Oswestry Disability Index category descriptors. Available from: Findjewelers.cz  Minimally Clinically Important Difference (MCID) = 12.8%   NDI:  NECK DISABILITY INDEX  Date: 01/11/2024 Score  Pain intensity 3 = The pain is fairly severe at the moment  2. Personal care (washing, dressing, etc.) 1 =  I can look after myself normally but it causes extra pain  3. Lifting 3 = Pain prevents me from lifting heavy weights but I can manage light to medium  weights if they are conveniently positioned  4. Reading 2 =  I can read as much as I want with moderate pain in my neck  5. Headaches 2 =  I have moderate headaches, which come infrequently  6. Concentration 1 =  I can concentrate fully when I want to with slight difficulty   7. Work 1 =  I can only do my usual work, but no more  8. Driving 1 =  I can drive my car as long as I want with slight pain in my neck  9. Sleeping 1 = My sleep is slightly disturbed (less than 1 hr  sleepless)  10. Recreation 3 = I am able to engage in a few of my usual recreation activities because of pain in   my neck  Total 18/50 = 36%   Minimum Detectable Change (90% confidence): 5 points or 10% points   SCREENING FOR RED FLAGS: Bowel or bladder incontinence: No Spinal tumors: No Cauda equina syndrome: No Compression fracture: No Abdominal aneurysm: No  COGNITION:  Overall cognitive status: Within functional limits for tasks assessed    SENSATION: WFL  POSTURE:  Has some Leftward scoliosis present;   increased lumbar extension/sway position   Supine:  Equal pelvic landmarks;  longer LLE at the malleolus by a small amount   PALPATION: Not specifically painful;   deep palpation of lumbar paraspinals and QL are nonpainful Lumbar posterior/anterior glides and rotational glides are non painful No palpation reproduces his pain  01/04/24:  neck eval:  TTP over the C4-7 transverse processes;  no palpable trigger points throughout L neck;  P/A vertebral glides are some restricted C4-7.  Side glides and rotational glides feel fairly normal  LUMBAR ROM:   Active  Eval  Flexion To knees; some end range pain  Extension 100%  Right lateral flexion To upper thigh;  stiff feeling  Left lateral flexion To upper thigh; stiff feeling  Right rotation 75%; stiff  Left rotation 100%  (Blank rows = not tested)  CERVICAL ROM: 01/04/24  Active ROM A/PROM (deg) eval  Flexion 90%  Extension 45%  Right lateral flexion 50%  Left lateral flexion 50%  Right rotation 80%  Left rotation 75%    No motion is especially painful   MUSCLE LENGTH: Hamstrings: Right SLR = 80 deg; Left SLR = 50 deg Hamstrings: very tight L, mild tight R ITB: NT Piriformis: a little tight BLE Hip flexors: not tight Quads: NT Heelcord: NT  LOWER EXTREMITY ROM:     Active  Right eval Left eval  Hip flexion    Hip extension    Hip abduction    Hip adduction    Hip internal rotation    Hip  external rotation    Knee flexion    Knee extension    Ankle dorsiflexion    Ankle plantarflexion    Ankle inversion    Ankle eversion    (Blank rows = not tested)  LOWER EXTREMITY MMT:    MMT Right eval Left eval  Hip flexion 4+ 4  Hip extension 3+ 4-  Hip abduction 4- 4  Hip adduction    Hip internal rotation 4 4-  Hip external rotation 4+ 4  Knee flexion 4 4-  Knee extension 5 4+  Ankle dorsiflexion 5 5  Ankle plantarflexion 5 5  Ankle inversion    Ankle eversion     (Blank rows = not tested)  LUMBAR SPECIAL TESTS:  Straight leg raise test: Negative,  Slump test: Negative, and Long sit test: Negative   Longer L malleolus but no change with long sit test;  Debby test is good flexibility, but increases his back pain for BLE (denies any SI pain)  CERVICAL SPECIAL TESTS: 01/04/24 Upper limb tension test (ULTT): Negative negative for median, radial, and ulnar nerves and unprovoked with cervical sidebending or rotation or both   FUNCTIONAL TESTS:  TBD if needed  GAIT: Distance walked: into clinic x 200' Assistive device utilized: None Level of assistance: Complete Independence Gait pattern: significant toe out BLE nearly contacting his other heel on swing phase of gait Comments: no limping noted   TODAY'S TREATMENT:  01/11/24 THERAPEUTIC EXERCISE: To improve strength and endurance.  Demonstration, verbal and tactile cues throughout for technique. Elliptical L0 x 5' F  THERAPEUTIC ACTIVITIES: To improve functional performance.  Demonstration, verbal and tactile cues throughout for technique. Supine foam roll:  Cervical and thoracic retraction x 20  Scapular depression x 20  Shoulder ER RTB w/ cerv retraction x 20  Shoulder HABD RTB w/ cerv retract x 20  90/90 shoulder ER x 20 BUE  Hands behind head isometric HABD x 20  Towel cervical extension glides x 8 C3-7 Seated towel rotational SNAGs x 10 to L;  x 10 to R at 3 different towel positions on the neck to  encompass all vertebrae Pool noodle under neck cervical rotation x 20 each direction for MFR effects  01/06/24 THERAPEUTIC EXERCISE: To improve strength and endurance.  Demonstration, verbal and tactile cues throughout for technique. Elliptical L0 x 6' F  THERAPEUTIC ACTIVITIES: To improve functional performance.  Demonstration, verbal and tactile cues throughout for technique. Seated hamstring stretch x 1'  Seated FABER piriformis x 1' BLE Seated cross over piriformis x 1' BLE SL hip abd x 30 BLE Prone plank x 10 SL plank from knees x 10 B  NEUROMUSCULAR RE-EDUCATION: To improve coordination, kinesthesia, posture, and proprioception. Seated 75 swiss ball Palloff press while holding knee extended blue TB x 2/10 BLE each direction Quadruped fire hydrant w/ TA and lateral YTB pull from PT x 2/10 BLE each direction  MANUAL THERAPY: To promote reduced pain utilizing percussion massage with massage gun. R lumbar paraspinals and quadratus lumborum  percussion massage for pain relief  01/04/24 PT reevaluation/evaluation for cervical spine pain (see notations above and below for subjective notes, objective measures, assessment and goals for the cervical spine)  SELF CARE: Provided education on PT POC progression and initial HEP for cervical spine (see below for detail).   12/28/23 SELF CARE: Provided education on PT POC progression.   PATIENT EDUCATION:  Education details: HEP review and HEP update  Person educated: Patient Education method: Explanation, Demonstration, Verbal cues, Tactile cues, Handouts, and [email or web address] app access provided Education comprehension: verbalized understanding, verbal cues required, tactile cues required, and needs further education  HOME EXERCISE PROGRAM: Access Code: [email or web address] URL: [email or web address] Date: 01/11/2024 Prepared by: Garnette Montclair  Exercises - Prone Hip Extension  - 1 x daily - 7 x weekly - 3 sets - 10 reps -  Sidelying Hip Abduction  - 1 x daily - 7 x weekly - 3 sets - 10 reps - Side Plank on Knees  - 1 x daily - 7 x weekly - 1 sets - 10 reps - Plank on Knees  - 1 x daily - 7 x weekly - 1 sets - 10 reps - Standard Plank  - 1 x daily - 7 x weekly - 1 sets -  10 reps - Quadruped Academic Librarian  - 1 x daily - 7 x weekly - 3 sets - 10 reps - Seated Hamstring Stretch  - 1 x daily - 7 x weekly - 1 sets - 2 reps - 1 min hold - Seated Anti-Rotation Press With Anchored Resistance  - 1 x daily - 7 x weekly - 3 sets - 10 reps - Seated Cervical Sidebending Stretch  - 1 x daily - 7 x weekly - 1 sets - 2 reps - 1 min hold - Supine Cervical Retraction with Towel  - 1 x daily - 7 x weekly - 1 sets - 10 reps - 3 sec hold - Supine Scapular Retraction  - 1 x daily - 7 x weekly - 1 sets - 10 reps - 3 sec hold - Supine Chest Stretch with Elbows Bent  - 1 x daily - 7 x weekly - 1 sets - 10 reps - 3 sec hold - Supine Shoulder External Rotation on Foam Roll with Theraband  - 1 x daily - 7 x weekly - 3 sets - 10 reps - Hooklying Shoulder T  - 1 x daily - 7 x weekly - 3 sets - 10 reps - Mid-Lower Cervical Extension SNAG with Strap  - 1 x daily - 7 x weekly - 1 sets - 10 reps  ASSESSMENT:  CLINICAL IMPRESSION: Patient is able to advance with his cervical HEP.  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  The flaring up and improvement cycle is also somewhat consistent with OA pain.   He exhibits considerable cervical spine stiffness with looking up and likely has some foraminal crowding.  He has no recent imaging for the neck.   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  No crepitus is felt with cervical ROM.   He has decreased posterior/anterior vertebral glide mobility from C4-7.  There are no palpable trigger points and nerve tension tests are negative for the LUE.  He has no radicular  symptoms in the LUE.   He demonstrates forward head posture with sloped shoulders and upper thoracic kyphosis.   Ismael would benefit from PT to address cervical ROM/stiffness, posture, and pain deficits.    He is agreeable to the POC  BACK EVAL:  Sahaj Bona is a 69 y.o. male who was referred to physical therapy for evaluation and treatment for acute on chronic LBP   Patient reports onset of  pain beginning 2 weeks ago for no apparent reason.   However, he saw PCP and started celebrex and is feeling better.    Pain is worse with sit to stand transfers and bending over/lifting and sitting or standing prolonged periods of time.  He spends a lot of time in the car and at his desk.  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  He has a slightly longer LLE and a little leftward leaning sway back posture.  Patient has deficits in lumbar ROM, hamstring LE flexibility, BLE strength, abnormal posture with some leftward lean, and residual pain which are interfering with ADLs and are impacting quality of life.  On Modified Oswestry patient scored 19/50 demonstrating 38% or moderate disability.  Drevin will benefit from skilled PT to address above deficits to improve mobility and activity tolerance with decreased pain interference.   OF  note, he is also having neck pain and is going to speak with PCP about a referral to address the cervical spine as well.  We will look for this order  OBJECTIVE IMPAIRMENTS: difficulty walking, decreased ROM, decreased strength, postural dysfunction, and pain.   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   REHAB  POTENTIAL: Good  CLINICAL DECISION MAKING: Evolving/moderate complexity  EVALUATION COMPLEXITY: Moderate   GOALS: Goals reviewed with patient? Yes  SHORT TERM GOALS: Target date: 01/25/2024   Patient will be independent with initial HEP to improve outcomes and carryover.  Baseline: 100% PT assist required for correct completion 01/04/24:  initial HEP for cervical pain initiated today 11/12:  progressing with initial HEP for cervical spine today Goal status: IN PROGRESS   2.  Patient will report 25% improvement in low back pain to improve QOL. Baseline: 5/10 Goal status: INITIAL  3.  Patient will report 50% subjective improvement in L sided cervical pain   Baseline:  11/12:  2/10  Goal status:  INITIAL on 01/04/24  LONG TERM GOALS: Target date: 02/22/2024   Patient will be independent with ongoing/advanced HEP for self-management at home.  Baseline: no advanced HEP yet 01/11/24:  advancing Goal status: IN PROGRESS  2.  Patient will report 50-75% improvement in low back pain to improve QOL.  Baseline: 5/10 Goal status: INITIAL  3.  Patient to demonstrate ability to achieve and maintain good spinal alignment/posturing and body mechanics needed for daily activities. Baseline: leftward lean with sway back Goal status: IN PROGRESS  4.  Patient will demonstrate full pain free lumbar ROM to perform ADLs.   Baseline: Refer to above lumbar ROM table Goal status: INITIAL  5.  Patient will demonstrate improved BLE strength to >/= 5/5 for improved stability and ease of mobility. Baseline: Refer to above LE MMT table Goal status: INITIAL  6. Patient will report </= 20% on Modified Oswestry (MCID = 12%) to demonstrate improved functional ability with decreased pain interference. Baseline: 38% Goal status: INITIAL  7.  Patient will tolerate 30 min of (standing/sitting/walking) w/o increased pain to allow for  improved mobility and activity tolerance. Baseline: states pain  begins after 10 min standing/walking Goal status: INITIAL   8.  Patient will demonstrate 60-80% cervical extension to be able to look up to do work around the house     Baseline:  see ROM tables above   Goal status:  INITIAL       9.  Patient will report >/= 75% improvement in neck pain    01/11/24:  2/10 Goal status:  INITIAL PLAN:  PT FREQUENCY: 1-2x/week  PT DURATION: 8 weeks  PLANNED INTERVENTIONS: 97164- PT Re-evaluation, 97110-Therapeutic exercises, 97530- Therapeutic activity, 97112- Neuromuscular re-education, 97535- Self Care, 02859- Manual therapy, [id number]- Electrical stimulation (unattended), 97035- Ultrasound, 02987- Traction (mechanical), [id number]- Ionotophoresis 4mg /ml Dexamethasone , 79439 (1-2 muscles), 20561 (3+ muscles)- Dry Needling, Patient/Family education, Balance training, Taping, Joint mobilization, Spinal mobilization, Cryotherapy, and Moist heat  PLAN FOR NEXT SESSION:  Address LBP next visit. Did cervical spine today   CERVICAL treatment focusing on flexibility of cervial and thoracic vertebral extension and postural strengthening.   Lumbar treatment focusing on core and lumbar stabilization w/ swiss ball, tall  kneel positions and hip abductors and extensors with PPT positions to prevent excessive lordosis posturing.    Eyob Godlewski, PT 01/11/2024, 10:53 AM

## 2024-01-13 ENCOUNTER — Ambulatory Visit: Admitting: Rehabilitation

## 2024-01-13 ENCOUNTER — Encounter: Payer: Self-pay | Admitting: Rehabilitation

## 2024-01-13 DIAGNOSIS — M6281 Muscle weakness (generalized): Secondary | ICD-10-CM

## 2024-01-13 DIAGNOSIS — R293 Abnormal posture: Secondary | ICD-10-CM

## 2024-01-13 DIAGNOSIS — M542 Cervicalgia: Secondary | ICD-10-CM

## 2024-01-13 DIAGNOSIS — M5412 Radiculopathy, cervical region: Secondary | ICD-10-CM

## 2024-01-13 DIAGNOSIS — M5459 Other low back pain: Secondary | ICD-10-CM | POA: Diagnosis not present

## 2024-01-13 DIAGNOSIS — R2681 Unsteadiness on feet: Secondary | ICD-10-CM

## 2024-01-13 NOTE — Therapy (Signed)
 OUTPATIENT PHYSICAL THERAPY LUMBAR TREATMENT   Patient Name: Noah Lane MRN: [id number] DOB:06-18-1955, 68 y.o., male Today's Date: 01/14/2024  END OF SESSION:    Past Medical History:  Diagnosis Date   Broken toe    Diabetes mellitus type 2 in obese    Mixed hyperlipidemia    Past Surgical History:  Procedure Laterality Date   COLON RESECTION N/A 04/27/2022   Procedure: LAPAROSCOPIC HAND ASSISTED RIGHT HEMI COLECTOMY, POSSIBLE OPEN;  Surgeon: Dasie Leonor CROME, MD;  Location: [facility];  Service: General;  Laterality: N/A;   COLONOSCOPY     HERNIA REPAIR  2012   Patient Active Problem List   Diagnosis Date Noted   Elevated blood pressure reading 05/04/2022   Colon adenocarcinoma (HCC) 05/04/2022   Gastroesophageal reflux disease 04/29/2022   Ileus, postoperative (HCC) 04/29/2022   Hypokalemia 04/28/2022   Hyperlipidemia 04/28/2022   Colonic obstruction (HCC) 04/25/2022   Hyponatremia 04/25/2022   OSA on CPAP 12/03/2021   Obesity (BMI 30.0-34.9) 12/03/2021   Mixed hyperlipidemia 10/20/2021   Strain of lumbar region 10/16/2021   Strain of calf muscle 07/22/2021   Patellofemoral pain syndrome of both knees 02/24/2021   Rotator cuff tendinitis, right 12/28/2019   Hip pain, bilateral 05/28/2019   Chronic bilateral low back pain without sciatica 05/28/2019   Type 2 diabetes mellitus with obesity 05/28/2019   Erectile dysfunction 05/28/2019   Pain and swelling of left knee 03/12/2019   Lateral epicondylitis, right elbow 03/15/2017   Diverticulosis of large intestine without hemorrhage 02/25/2014    PCP: Frann Mabel Mt, DO   REFERRING PROVIDER: Dorina Loving, PA-C   REFERRING DIAG: M54.50 (ICD-10-CM) - Acute midline low back pain without sciatica  THERAPY DIAG:  Other low back pain  Muscle weakness (generalized)  Abnormal posture  Neck pain  Radiculopathy, cervical region  Unsteadiness on feet  RATIONALE FOR EVALUATION AND TREATMENT:  Rehabilitation  ONSET DATE: 2 weeks ago  NEXT MD VISIT:    SUBJECTIVE:                                                                                                                                                                                                         SUBJECTIVE STATEMENT: Patient reports stumbled yesterday and fell.  States he tucked and rolled and did not get hurt with the fall except that his back is sore today.  States his neck feels better.   01/04/24 EVAL CERVICAL PAIN:  Patient is reevaluated today for cervical pain.  He has a new order from his PCP for neck pain evaluate/treat.  He reports the pain started several weeks ago for no apparent reason.  He states the pain was on the L side of his cervical spine and top of the L shoulder/upper trap area.   He isn't sure what precipitated it.   Denies any trauma recently.  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   He has had off/on neck pain since that time, but usually gets better.   He states he is feeling better with the neck pain this week than he did last week.   Pain level is only minimal now.   He is still working as a ecologist and admits that he has a fair amount of stress which he feels may contribute to his neck pain.   He does a lot of sitting and driving with his job.   He denies any pain, numbness, tingling into the L arm.    EVAL LBP: 68 y/o patient referred to PT for LBP.   He is well known to us  from recent episodes of PT for this issue with the latest being about 2 months ago.  Patient states 2 weeks ago he started having severe R sided LBP with any movement.   He reports the pain was severe initially, but is now doing better with taking Celebrex.  States he can't recall any injury that would have precipitated this.  He states that he is currently having a lot of soreness now, but it is not constant.  States feels it more with sit to stand transfers.   Played golf 3-4 weeks ago, but doesn't think this aggravated him.  Doing piriformis stretching and lying flat helps him some.   He has had a back injection many years ago, but nothing since then.  No MRI.  Old xray shows DDD, facet arthropathy.    He does have a long history of off/on back pain with difficulty playing golf at times, which he likes to do.   However he states he is not going to try to play anymore this year, and is going to try to focus on exercises to improve his back pain and prevent reinjury  PAIN: Are you having pain? Yes: NPRS scale: 5/10 now and worst over last week.    Pain location: central low back now Pain description: soreness most of the time; sharp at times Aggravating factors: lifting,  Relieving factors: lying flat  PERTINENT HISTORY:  DM-II, obesity, elevated BP, GERD, B patellofemoral pain syndrome, B hip pain, h/o R RTC tendinitis, chronic B LBP w/o sciatica, scoliosis, diabetic peripheral neuropathy   PRECAUTIONS: None  RED FLAGS: None  WEIGHT BEARING RESTRICTIONS: No  FALLS:  Has patient fallen in last 6 months? No  LIVING ENVIRONMENT: Lives with: lives with their family and lives with their spouse Lives in: House/apartment Stairs: Yes: Internal: 14 steps; on left going up and External: 1 steps; unknown Has following equipment at home: Single point cane and Environmental Consultant - 2 wheeled  OCCUPATION: pharmacist, hospital, mostly seated desk work and driving  PLOF: Independent  PATIENT GOALS: improve back pain and prevent further flareups/reinjuries   OBJECTIVE: (objective measures completed at initial evaluation unless otherwise dated)  DIAGNOSTIC FINDINGS:  EXAM: LUMBAR SPINE - 2-3 VIEW   COMPARISON:  None Available.   FINDINGS: Evaluation is limited due to body habitus.   Five lumbar type vertebra. There is no acute fracture or subluxation of the lumbar spine.  There is multilevel degenerative changes with disc space narrowing,  endplate irregularity and spurring/osteophyte. Multilevel facet arthropathy. The visualized posterior elements appear intact. There is atherosclerotic calcification of the aorta. The soft tissues are grossly unremarkable.   IMPRESSION: No acute fracture or subluxation of the lumbar spine.     Electronically Signed   By: Vanetta Chou M.D.   On: 10/18/2021 20:41  PATIENT SURVEYS:  Modified Oswestry:  MODIFIED OSWESTRY DISABILITY SCALE  Date: 01/14/2024 Score  Pain intensity 2 =  Pain medication provides me with complete relief from pain.  2. Personal care (washing, dressing, etc.) 1 =  I can take care of myself normally, but it increases my pain.  3. Lifting 4 = I can lift only very light weights  4. Walking 1 = Pain prevents me from walking more than 1 mile.  5. Sitting 2 =  Pain prevents me from sitting more than 1 hour.  6. Standing 4 =  Pain prevents me from standing more than 10 minutes.  7. Sleeping 1 = I can sleep well only by using pain medication.  8. Social Life 1 =  My social life is normal, but it increases my level of pain.  9. Traveling 1 =  I can travel anywhere, but it increases my pain.  10. Employment/ Homemaking 2 = I can perform most of my homemaking/job duties, but pain prevents me from performing more physically stressful activities (eg, lifting, vacuuming).  Total 19/50   Interpretation of scores: Score Category Description  0-20% Minimal Disability The patient can cope with most living activities. Usually no treatment is indicated apart from advice on lifting, sitting and exercise  21-40% Moderate Disability The patient experiences more pain and difficulty with sitting, lifting and standing. Travel and social life are more difficult and they may be disabled from work. Personal care, sexual activity and sleeping are not grossly affected, and the patient can usually be managed by conservative means  41-60% Severe Disability Pain remains the main problem in  this group, but activities of daily living are affected. These patients require a detailed investigation  61-80% Crippled Back pain impinges on all aspects of the patient's life. Positive intervention is required  81-100% Bed-bound These patients are either bed-bound or exaggerating their symptoms  Bluford FORBES Zoe DELENA Karon DELENA, et al. Surgery versus conservative management of stable thoracolumbar fracture: the PRESTO feasibility RCT. [place] ([place]): [employer]; 2021 Nov. [facility] Technology Assessment, No. 25.62.) Appendix 3, Oswestry Disability Index category descriptors. Available from: [email or web address].cz  Minimally Clinically Important Difference (MCID) = 12.8%   NDI:  NECK DISABILITY INDEX  Date: 01/14/2024 Score  Pain intensity 3 = The pain is fairly severe at the moment  2. Personal care (washing, dressing, etc.) 1 =  I can look after myself normally but it causes extra pain  3. Lifting 3 = Pain prevents me from lifting heavy weights but I can manage light to medium   weights if they are conveniently positioned  4. Reading 2 =  I can read as much as I want with moderate pain in my neck  5. Headaches 2 =  I have moderate headaches, which come infrequently  6. Concentration 1 =  I can concentrate fully when I want to with slight difficulty   7. Work 1 =  I can only do my usual work, but no more  8. Driving 1 =  I can drive my car as long as I want with slight pain in  my neck  9. Sleeping 1 = My sleep is slightly disturbed (less than 1 hr sleepless)  10. Recreation 3 = I am able to engage in a few of my usual recreation activities because of pain in   my neck  Total 18/50 = 36%   Minimum Detectable Change (90% confidence): 5 points or 10% points   SCREENING FOR RED FLAGS: Bowel or bladder incontinence: No Spinal tumors: No Cauda equina syndrome: No Compression fracture: No Abdominal aneurysm: No  COGNITION:  Overall cognitive status: Within  functional limits for tasks assessed    SENSATION: WFL  POSTURE:  Has some Leftward scoliosis present;   increased lumbar extension/sway position   Supine:  Equal pelvic landmarks;  longer LLE at the malleolus by a small amount   PALPATION: Not specifically painful;   deep palpation of lumbar paraspinals and QL are nonpainful Lumbar posterior/anterior glides and rotational glides are non painful No palpation reproduces his pain  01/04/24:  neck eval:  TTP over the C4-7 transverse processes;  no palpable trigger points throughout L neck;  P/A vertebral glides are some restricted C4-7.  Side glides and rotational glides feel fairly normal  LUMBAR ROM:   Active  Eval  Flexion To knees; some end range pain  Extension 100%  Right lateral flexion To upper thigh;  stiff feeling  Left lateral flexion To upper thigh; stiff feeling  Right rotation 75%; stiff  Left rotation 100%  (Blank rows = not tested)  CERVICAL ROM: 01/04/24  Active ROM A/PROM (deg) eval  Flexion 90%  Extension 45%  Right lateral flexion 50%  Left lateral flexion 50%  Right rotation 80%  Left rotation 75%    No motion is especially painful   MUSCLE LENGTH: Hamstrings: Right SLR = 80 deg; Left SLR = 50 deg Hamstrings: very tight L, mild tight R ITB: NT Piriformis: a little tight BLE Hip flexors: not tight Quads: NT Heelcord: NT  LOWER EXTREMITY ROM:     Active  Right eval Left eval  Hip flexion    Hip extension    Hip abduction    Hip adduction    Hip internal rotation    Hip external rotation    Knee flexion    Knee extension    Ankle dorsiflexion    Ankle plantarflexion    Ankle inversion    Ankle eversion    (Blank rows = not tested)  LOWER EXTREMITY MMT:    MMT Right eval Left eval  Hip flexion 4+ 4  Hip extension 3+ 4-  Hip abduction 4- 4  Hip adduction    Hip internal rotation 4 4-  Hip external rotation 4+ 4  Knee flexion 4 4-  Knee extension 5 4+  Ankle dorsiflexion 5 5   Ankle plantarflexion 5 5  Ankle inversion    Ankle eversion     (Blank rows = not tested)  LUMBAR SPECIAL TESTS:  Straight leg raise test: Negative, Slump test: Negative, and Long sit test: Negative   Longer L malleolus but no change with long sit test;  Debby test is good flexibility, but increases his back pain for BLE (denies any SI pain)  CERVICAL SPECIAL TESTS: 01/04/24 Upper limb tension test (ULTT): Negative negative for median, radial, and ulnar nerves and unprovoked with cervical sidebending or rotation or both   FUNCTIONAL TESTS:  TBD if needed  GAIT: Distance walked: into clinic x 200' Assistive device utilized: None Level of assistance: Complete Independence Gait pattern: significant toe  out BLE nearly contacting his other heel on swing phase of gait Comments: no limping noted   TODAY'S TREATMENT:  01/13/24 THERAPEUTIC EXERCISE: To improve strength and endurance.  Demonstration, verbal and tactile cues throughout for technique. Elliptical L0 x 6' F  THERAPEUTIC ACTIVITIES: To improve functional performance.  Demonstration, verbal and tactile cues throughout for technique. Seated hamstring stretch x 1'  Seated FABER piriformis x 1' BLE SL hip abd x 20 BLE Prone plank x 10 SL plank from knees x 10 B Ab curl up/reach x 2/10  NEUROMUSCULAR RE-EDUCATION: To improve coordination, kinesthesia, posture, and proprioception. Seated 75 swiss ball Palloff press while holding knee extended blue TB x 2/10 BLE each direction Supine bridge w/ palloff press x 2/10 BUE  MANUAL THERAPY: To promote reduced pain utilizing percussion massage with massage gun. R lumbar paraspinals and quadratus lumborum and posterolateral R hip percussion massage for pain relief  01/11/24 THERAPEUTIC EXERCISE: To improve strength and endurance.  Demonstration, verbal and tactile cues throughout for technique. Elliptical L0 x 5' F  THERAPEUTIC ACTIVITIES: To improve functional performance.   Demonstration, verbal and tactile cues throughout for technique. Supine foam roll:  Cervical and thoracic retraction x 20  Scapular depression x 20  Shoulder ER RTB w/ cerv retraction x 20  Shoulder HABD RTB w/ cerv retract x 20  90/90 shoulder ER x 20 BUE  Hands behind head isometric HABD x 20  Towel cervical extension glides x 8 C3-7 Seated towel rotational SNAGs x 10 to L;  x 10 to R at 3 different towel positions on the neck to encompass all vertebrae Pool noodle under neck cervical rotation x 20 each direction for MFR effects  01/06/24 THERAPEUTIC EXERCISE: To improve strength and endurance.  Demonstration, verbal and tactile cues throughout for technique. Elliptical L0 x 6' F  THERAPEUTIC ACTIVITIES: To improve functional performance.  Demonstration, verbal and tactile cues throughout for technique. Seated hamstring stretch x 1'  Seated FABER piriformis x 1' BLE Seated cross over piriformis x 1' BLE SL hip abd x 30 BLE Prone plank x 10 SL plank from knees x 10 B  NEUROMUSCULAR RE-EDUCATION: To improve coordination, kinesthesia, posture, and proprioception. Seated 75 swiss ball Palloff press while holding knee extended blue TB x 2/10 BLE each direction Quadruped fire hydrant w/ TA and lateral YTB pull from PT x 2/10 BLE each direction  MANUAL THERAPY: To promote reduced pain utilizing percussion massage with massage gun. R lumbar paraspinals and quadratus lumborum  percussion massage for pain relief  01/04/24 PT reevaluation/evaluation for cervical spine pain (see notations above and below for subjective notes, objective measures, assessment and goals for the cervical spine)  SELF CARE: Provided education on PT POC progression and initial HEP for cervical spine (see below for detail).   12/28/23 SELF CARE: Provided education on PT POC progression.   PATIENT EDUCATION:  Education details: HEP review and HEP update  Person educated: Patient Education method: Explanation,  Demonstration, Verbal cues, Tactile cues, Handouts, and MedBridgeGO app access provided Education comprehension: verbalized understanding, verbal cues required, tactile cues required, and needs further education  HOME EXERCISE PROGRAM: Access Code: [email or web address] URL: [email or web address] Date: 01/11/2024 Prepared by: Garnette Montclair  Exercises - Prone Hip Extension  - 1 x daily - 7 x weekly - 3 sets - 10 reps - Sidelying Hip Abduction  - 1 x daily - 7 x weekly - 3 sets - 10 reps - Side Plank on Knees  - 1 x daily - 7  x weekly - 1 sets - 10 reps - Plank on Knees  - 1 x daily - 7 x weekly - 1 sets - 10 reps - Standard Plank  - 1 x daily - 7 x weekly - 1 sets - 10 reps - Quadruped Fire Hydrant  - 1 x daily - 7 x weekly - 3 sets - 10 reps - Seated Hamstring Stretch  - 1 x daily - 7 x weekly - 1 sets - 2 reps - 1 min hold - Seated Anti-Rotation Press With Anchored Resistance  - 1 x daily - 7 x weekly - 3 sets - 10 reps - Seated Cervical Sidebending Stretch  - 1 x daily - 7 x weekly - 1 sets - 2 reps - 1 min hold - Supine Cervical Retraction with Towel  - 1 x daily - 7 x weekly - 1 sets - 10 reps - 3 sec hold - Supine Scapular Retraction  - 1 x daily - 7 x weekly - 1 sets - 10 reps - 3 sec hold - Supine Chest Stretch with Elbows Bent  - 1 x daily - 7 x weekly - 1 sets - 10 reps - 3 sec hold - Supine Shoulder External Rotation on Foam Roll with Theraband  - 1 x daily - 7 x weekly - 3 sets - 10 reps - Hooklying Shoulder T  - 1 x daily - 7 x weekly - 3 sets - 10 reps - Mid-Lower Cervical Extension SNAG with Strap  - 1 x daily - 7 x weekly - 1 sets - 10 reps  ASSESSMENT:  CLINICAL IMPRESSION: Patient is able to complete all therex today.  HE is painful on the R low back today. Continued stretching and stabilization exercises for OA mgmt.  Continue to review and progress HEP so that he can continue independently on his own for mgmt of OA flareups.   Continue to advised to avoid  offending activities.    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  The flaring up and improvement cycle is also somewhat consistent with OA pain.   He exhibits considerable cervical spine stiffness with looking up and likely has some foraminal crowding.  He has no recent imaging for the neck.   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  No crepitus is felt with cervical ROM.   He has decreased posterior/anterior vertebral glide mobility from C4-7.  There are no palpable trigger points and nerve tension tests are negative for the LUE.  He has no radicular symptoms in the LUE.   He demonstrates forward head posture with sloped shoulders and upper thoracic kyphosis.   Crist would benefit from PT to address cervical ROM/stiffness, posture, and pain deficits.    He is agreeable to the POC  BACK EVAL:  Eragon Hammond is a 68 y.o. male who was referred to physical therapy for evaluation and treatment for acute on chronic LBP   Patient reports onset of  pain beginning 2 weeks ago for no apparent reason.   However, he saw PCP and started celebrex and is feeling better.    Pain is worse with sit to stand transfers and bending over/lifting and sitting or standing prolonged periods of time.  He spends a lot of time in the car and at his desk.  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  He has a slightly longer LLE and a little leftward leaning sway back posture.  Patient has deficits in lumbar ROM, hamstring LE flexibility, BLE strength, abnormal posture with some leftward lean, and residual pain which are interfering with ADLs and are impacting quality of life.  On Modified Oswestry patient scored 19/50 demonstrating 38% or moderate disability.  [place] will benefit from skilled PT to address above deficits to improve mobility and activity tolerance with decreased pain  interference.   OF note, he is also having neck pain and is going to speak with PCP about a referral to address the cervical spine as well.  We will look for this order  OBJECTIVE IMPAIRMENTS: difficulty walking, decreased ROM, decreased strength, postural dysfunction, and pain.   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   REHAB POTENTIAL: Good  CLINICAL DECISION MAKING: Evolving/moderate complexity  EVALUATION COMPLEXITY: Moderate   GOALS: Goals reviewed with patient? Yes  SHORT TERM GOALS: Target date: 01/25/2024   Patient will be independent with initial HEP to improve outcomes and carryover.  Baseline: 100% PT assist required for correct completion 01/04/24:  initial HEP for cervical pain initiated today 11/12:  progressing with initial HEP for cervical spine today Goal status: MET   2.  Patient will report 25% improvement in low back pain to improve QOL. Baseline: 5/10 Goal status: INITIAL  3.  Patient will report 50% subjective improvement in L sided cervical pain   Baseline:  11/12:  2/10  Goal status:  INITIAL on 01/04/24  LONG TERM GOALS: Target date: 02/22/2024   Patient will be independent with ongoing/advanced HEP for self-management at home.  Baseline: no advanced HEP yet 01/11/24:  advancing Goal status: IN PROGRESS  2.  Patient will report 50-75% improvement in low back pain to improve QOL.  Baseline: 5/10 Goal status: INITIAL  3.  Patient to demonstrate ability to achieve and maintain good spinal alignment/posturing and body mechanics needed for daily activities. Baseline: leftward lean with  sway back Goal status: IN PROGRESS  4.  Patient will demonstrate full pain free lumbar ROM to perform ADLs.   Baseline: Refer to above lumbar ROM table Goal status: INITIAL  5.  Patient will demonstrate improved BLE strength to >/= 5/5 for improved stability and ease of mobility. Baseline: Refer to above LE MMT table Goal status: INITIAL  6. Patient will report </= 20% on Modified Oswestry (MCID = 12%) to demonstrate improved functional ability with decreased pain interference. Baseline: 38% Goal status: INITIAL  7.  Patient will tolerate 30 min of (standing/sitting/walking) w/o increased pain to allow for  improved mobility and activity tolerance. Baseline: states pain begins after 10 min standing/walking Goal status: INITIAL   8.  Patient will demonstrate 60-80% cervical extension to be able to look up to do work around the house     Baseline:  see ROM tables above   Goal status:  INITIAL       9.  Patient will report >/= 75% improvement in neck pain    01/11/24:  2/10 Goal status:  INITIAL PLAN:  PT FREQUENCY: 1-2x/week  PT DURATION: 8 weeks  PLANNED INTERVENTIONS: [id number]- PT Re-evaluation, 97110-Therapeutic exercises,  [id number]- Therapeutic activity, [id number]- Neuromuscular re-education, [id number]- Self Care, [id number]- Manual therapy, [id number]- Electrical stimulation (unattended), [id number]- Ultrasound, [id number]- Traction (mechanical), [id number]- Ionotophoresis 4mg /ml Dexamethasone , [id number] (1-2 muscles), [id number] (3+ muscles)- Dry Needling, Patient/Family education, Balance training, Taping, Joint mobilization, Spinal mobilization, Cryotherapy, and Moist heat  PLAN FOR NEXT SESSION:  Address cervical postural deficits and pain next visit.   CERVICAL treatment focusing on flexibility of cervial and thoracic vertebral extension and postural strengthening.   Lumbar treatment focusing on core and lumbar stabilization w/ swiss ball, tall kneel positions and hip abductors and extensors with PPT positions to prevent  excessive lordosis posturing.    Zeshan Sena, PT 01/14/2024, 8:33 PM

## 2024-01-17 ENCOUNTER — Ambulatory Visit: Admitting: Rehabilitation

## 2024-01-17 ENCOUNTER — Encounter: Payer: Self-pay | Admitting: Rehabilitation

## 2024-01-17 ENCOUNTER — Other Ambulatory Visit: Payer: Self-pay | Admitting: Pharmacist

## 2024-01-17 DIAGNOSIS — M5459 Other low back pain: Secondary | ICD-10-CM | POA: Diagnosis not present

## 2024-01-17 DIAGNOSIS — M6281 Muscle weakness (generalized): Secondary | ICD-10-CM

## 2024-01-17 DIAGNOSIS — R293 Abnormal posture: Secondary | ICD-10-CM

## 2024-01-17 DIAGNOSIS — M542 Cervicalgia: Secondary | ICD-10-CM

## 2024-01-17 NOTE — Therapy (Signed)
 OUTPATIENT PHYSICAL THERAPY LUMBAR TREATMENT   Patient Name: Noah Lane MRN: [id number] DOB:11-Mar-1955, 68 y.o., male Today's Date: 01/17/2024  END OF SESSION:  PT End of Session - 01/17/24 0905     Visit Number 6    Date for Recertification  02/22/24    PT Start Time 0900   15 min late   PT Stop Time 0930    PT Time Calculation (min) 30 min    Activity Tolerance Patient tolerated treatment well;No increased pain    Behavior During Therapy WFL for tasks assessed/performed           Past Medical History:  Diagnosis Date   Broken toe    Diabetes mellitus type 2 in obese    Mixed hyperlipidemia    Past Surgical History:  Procedure Laterality Date   COLON RESECTION N/A 04/27/2022   Procedure: LAPAROSCOPIC HAND ASSISTED RIGHT HEMI COLECTOMY, POSSIBLE OPEN;  Surgeon: Dasie Leonor CROME, MD;  Location: [facility];  Service: General;  Laterality: N/A;   COLONOSCOPY     HERNIA REPAIR  2012   Patient Active Problem List   Diagnosis Date Noted   Elevated blood pressure reading 05/04/2022   Colon adenocarcinoma (HCC) 05/04/2022   Gastroesophageal reflux disease 04/29/2022   Ileus, postoperative (HCC) 04/29/2022   Hypokalemia 04/28/2022   Hyperlipidemia 04/28/2022   Colonic obstruction (HCC) 04/25/2022   Hyponatremia 04/25/2022   OSA on CPAP 12/03/2021   Obesity (BMI 30.0-34.9) 12/03/2021   Mixed hyperlipidemia 10/20/2021   Strain of lumbar region 10/16/2021   Strain of calf muscle 07/22/2021   Patellofemoral pain syndrome of both knees 02/24/2021   Rotator cuff tendinitis, right 12/28/2019   Hip pain, bilateral 05/28/2019   Chronic bilateral low back pain without sciatica 05/28/2019   Type 2 diabetes mellitus with obesity 05/28/2019   Erectile dysfunction 05/28/2019   Pain and swelling of left knee 03/12/2019   Lateral epicondylitis, right elbow 03/15/2017   Diverticulosis of large intestine without hemorrhage 02/25/2014    PCP: Frann Mabel Mt, DO    REFERRING PROVIDER: Dorina Loving, PA-C   REFERRING DIAG: M54.50 (ICD-10-CM) - Acute midline low back pain without sciatica  THERAPY DIAG:  Muscle weakness (generalized)  Abnormal posture  Neck pain  RATIONALE FOR EVALUATION AND TREATMENT: Rehabilitation  ONSET DATE: 2 weeks ago  NEXT MD VISIT:    SUBJECTIVE:  SUBJECTIVE STATEMENT: States neck and back pain were improved over the weekend.   States he was able to do some lifting, carrying, shoveling around the house without hurting.   Rates neck pain today 2/10 on the L side  01/04/24 EVAL CERVICAL PAIN:  Patient is reevaluated today for cervical pain.  He has a new order from his PCP for neck pain evaluate/treat.   He reports the pain started several weeks ago for no apparent reason.  He states the pain was on the L side of his cervical spine and top of the L shoulder/upper trap area.   He isn't sure what precipitated it.   Denies any trauma recently.  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   He has had off/on neck pain since that time, but usually gets better.   He states he is feeling better with the neck pain this week than he did last week.   Pain level is only minimal now.   He is still working as a ecologist and admits that he has a fair amount of stress which he feels may contribute to his neck pain.   He does a lot of sitting and driving with his job.   He denies any pain, numbness, tingling into the L arm.    EVAL LBP: 68 y/o patient referred to PT for LBP.   He is well known to us  from recent episodes of PT for this issue with the latest being about 2 months ago.  Patient states 2 weeks ago he started having severe R sided LBP with any movement.   He reports the  pain was severe initially, but is now doing better with taking Celebrex.  States he can't recall any injury that would have precipitated this.  He states that he is currently having a lot of soreness now, but it is not constant.  States feels it more with sit to stand transfers.  Played golf 3-4 weeks ago, but doesn't think this aggravated him.  Doing piriformis stretching and lying flat helps him some.   He has had a back injection many years ago, but nothing since then.  No MRI.  Old xray shows DDD, facet arthropathy.    He does have a long history of off/on back pain with difficulty playing golf at times, which he likes to do.   However he states he is not going to try to play anymore this year, and is going to try to focus on exercises to improve his back pain and prevent reinjury  PAIN: Are you having pain? Yes: NPRS scale: 5/10 now and worst over last week.    Pain location: central low back now Pain description: soreness most of the time; sharp at times Aggravating factors: lifting,  Relieving factors: lying flat  PERTINENT HISTORY:  DM-II, obesity, elevated BP, GERD, B patellofemoral pain syndrome, B hip pain, h/o R RTC tendinitis, chronic B LBP w/o sciatica, scoliosis, diabetic peripheral neuropathy   PRECAUTIONS: None  RED FLAGS: None  WEIGHT BEARING RESTRICTIONS: No  FALLS:  Has patient fallen in last 6 months? No  LIVING ENVIRONMENT: Lives with: lives with their family and lives with their spouse Lives in: House/apartment Stairs: Yes: Internal: 14 steps; on left going up and External: 1 steps; unknown Has following equipment at home: Single point cane and Walker - 2 wheeled  OCCUPATION: pharmacist, hospital, mostly seated desk work and driving  PLOF: Independent  PATIENT GOALS: improve back pain and prevent further flareups/reinjuries   OBJECTIVE: (objective measures completed at initial evaluation unless otherwise dated)  DIAGNOSTIC FINDINGS:  EXAM: LUMBAR  SPINE - 2-3 VIEW   COMPARISON:  None Available.   FINDINGS: Evaluation is limited due to body habitus.   Five lumbar type vertebra. There is no acute fracture or subluxation of the lumbar spine. There is multilevel degenerative changes with disc space narrowing, endplate irregularity and spurring/osteophyte. Multilevel facet arthropathy. The visualized posterior elements appear intact. There is atherosclerotic calcification of the aorta. The soft tissues are grossly unremarkable.   IMPRESSION: No acute fracture or subluxation of the lumbar spine.     Electronically Signed   By: Vanetta Chou M.D.   On: 10/18/2021 20:41  PATIENT SURVEYS:  Modified Oswestry:  MODIFIED OSWESTRY DISABILITY SCALE  Date: 01/17/2024 Score  Pain intensity 2 =  Pain medication provides me with complete relief from pain.  2. Personal care (washing, dressing, etc.) 1 =  I can take care of myself normally, but it increases my pain.  3. Lifting 4 = I can lift only very light weights  4. Walking 1 = Pain prevents me from walking more than 1 mile.  5. Sitting 2 =  Pain prevents me from sitting more than 1 hour.  6. Standing 4 =  Pain prevents me from standing more than 10 minutes.  7. Sleeping 1 = I can sleep well only by using pain medication.  8. Social Life 1 =  My social life is normal, but it increases my level of pain.  9. Traveling 1 =  I can travel anywhere, but it increases my pain.  10. Employment/ Homemaking 2 = I can perform most of my homemaking/job duties, but pain prevents me from performing more physically stressful activities (eg, lifting, vacuuming).  Total 19/50   Interpretation of scores: Score Category Description  0-20% Minimal Disability The patient can cope with most living activities. Usually no treatment is indicated apart from advice on lifting, sitting and exercise  21-40% Moderate Disability The patient experiences more pain and difficulty with sitting, lifting and standing.  Travel and social life are more difficult and they may be disabled from work. Personal care, sexual activity and sleeping are not grossly affected, and the patient can usually be managed by conservative means  41-60% Severe Disability Pain remains the main problem in this group, but activities of daily living are affected. These patients require a detailed investigation  61-80% Crippled Back pain impinges on all aspects of the patient's life. Positive intervention is required  81-100% Bed-bound These patients are either bed-bound or exaggerating their symptoms  Bluford FORBES Zoe DELENA Karon DELENA, et al. Surgery versus conservative management of stable thoracolumbar fracture: the PRESTO feasibility RCT. [place] ([place]): [employer]; 2021 Nov. [facility] Technology Assessment, No. 25.62.) Appendix 3, Oswestry Disability Index category descriptors. Available from: Findjewelers.cz  Minimally Clinically Important Difference (MCID) = 12.8%   NDI:  NECK DISABILITY INDEX  Date: 01/17/2024 Score  Pain intensity 3 = The pain is fairly severe at the moment  2. Personal care (washing, dressing, etc.) 1 =  I can look after myself normally but it causes extra pain  3. Lifting 3 = Pain prevents me from lifting heavy weights but I can manage light to medium   weights if they are conveniently positioned  4. Reading 2 =  I can read as much as I want with moderate pain in my neck  5. Headaches 2 =  I have moderate headaches, which come infrequently  6. Concentration 1 =  I can concentrate fully when I want to with slight difficulty   7. Work 1 =  I can only do my usual work, but no more  8. Driving 1 =  I can drive my car as long as I want with slight pain in my neck  9. Sleeping 1 = My sleep is slightly disturbed (less than 1 hr sleepless)  10. Recreation 3 = I am able to engage in a few of my usual recreation activities because of pain in   my neck  Total 18/50 = 36%   Minimum  Detectable Change (90% confidence): 5 points or 10% points   SCREENING FOR RED FLAGS: Bowel or bladder incontinence: No Spinal tumors: No Cauda equina syndrome: No Compression fracture: No Abdominal aneurysm: No  COGNITION:  Overall cognitive status: Within functional limits for tasks assessed    SENSATION: WFL  POSTURE:  Has some Leftward scoliosis present;   increased lumbar extension/sway position   Supine:  Equal pelvic landmarks;  longer LLE at the malleolus by a small amount   PALPATION: Not specifically painful;   deep palpation of lumbar paraspinals and QL are nonpainful Lumbar posterior/anterior glides and rotational glides are non painful No palpation reproduces his pain  01/04/24:  neck eval:  TTP over the C4-7 transverse processes;  no palpable trigger points throughout L neck;  P/A vertebral glides are some restricted C4-7.  Side glides and rotational glides feel fairly normal  LUMBAR ROM:   Active  Eval  Flexion To knees; some end range pain  Extension 100%  Right lateral flexion To upper thigh;  stiff feeling  Left lateral flexion To upper thigh; stiff feeling  Right rotation 75%; stiff  Left rotation 100%  (Blank rows = not tested)  CERVICAL ROM: 01/04/24  Active ROM A/PROM (deg) eval  Flexion 90%  Extension 45%  Right lateral flexion 50%  Left lateral flexion 50%  Right rotation 80%  Left rotation 75%    No motion is especially painful   MUSCLE LENGTH: Hamstrings: Right SLR = 80 deg; Left SLR = 50 deg Hamstrings: very tight L, mild tight R ITB: NT Piriformis: a little tight BLE Hip flexors: not tight Quads: NT Heelcord: NT  LOWER EXTREMITY ROM:     Active  Right eval Left eval  Hip flexion    Hip extension    Hip abduction    Hip adduction    Hip internal rotation    Hip external rotation    Knee flexion    Knee extension    Ankle dorsiflexion    Ankle plantarflexion    Ankle inversion    Ankle eversion    (Blank rows =  not tested)  LOWER EXTREMITY MMT:    MMT Right eval Left eval  Hip flexion 4+ 4  Hip extension 3+ 4-  Hip abduction 4- 4  Hip adduction    Hip internal rotation 4 4-  Hip external rotation 4+ 4  Knee flexion 4 4-  Knee extension 5 4+  Ankle dorsiflexion 5 5  Ankle plantarflexion 5 5  Ankle inversion    Ankle eversion     (Blank rows = not tested)  LUMBAR SPECIAL TESTS:  Straight leg raise test: Negative, Slump test: Negative, and Long sit test: Negative   Longer L malleolus but no change with long sit test;  Debby test is good flexibility, but  increases his back pain for BLE (denies any SI pain)  CERVICAL SPECIAL TESTS: 01/04/24 Upper limb tension test (ULTT): Negative negative for median, radial, and ulnar nerves and unprovoked with cervical sidebending or rotation or both   FUNCTIONAL TESTS:  TBD if needed  GAIT: Distance walked: into clinic x 200' Assistive device utilized: None Level of assistance: Complete Independence Gait pattern: significant toe out BLE nearly contacting his other heel on swing phase of gait Comments: no limping noted   TODAY'S TREATMENT:  01/17/24 NEUROMUSCULAR RE-EDUCATION: To improve coordination, kinesthesia, posture, and proprioception. Foam roll lying:  Shoulder ER w/ cerv retract w/ RTB x 20 BUE  Shoulder HABD w/ cerv retract RTB x 20 BUE  Snow angels w/ cerv retract x 20 BUE  Scapular depression w/ cerv retraction x 20 BUE  D2 PNF extension YTB w/ cerv retraction x 20 BUE  90/90 shoulder ER 1# x 20 BUE  Horizontal foam roll upper thoracic mobilizations x 2/10 hands behind head   MANUAL THERAPY: To promote improved joint mobility and reduced pain utilizing joint mobilization and myofascial release. Grade 3-4 jt mobilizations to C3-7 for P/A glides and rotations R x 20 reps for C6-7;    suboccipital release  01/13/24 THERAPEUTIC EXERCISE: To improve strength and endurance.  Demonstration, verbal and tactile cues throughout for  technique. Elliptical L0 x 6' F  THERAPEUTIC ACTIVITIES: To improve functional performance.  Demonstration, verbal and tactile cues throughout for technique. Seated hamstring stretch x 1'  Seated FABER piriformis x 1' BLE SL hip abd x 20 BLE Prone plank x 10 SL plank from knees x 10 B Ab curl up/reach x 2/10  NEUROMUSCULAR RE-EDUCATION: To improve coordination, kinesthesia, posture, and proprioception. Seated 75 swiss ball Palloff press while holding knee extended blue TB x 2/10 BLE each direction Supine bridge w/ palloff press x 2/10 BUE  MANUAL THERAPY: To promote reduced pain utilizing percussion massage with massage gun. R lumbar paraspinals and quadratus lumborum and posterolateral R hip percussion massage for pain relief  01/11/24 THERAPEUTIC EXERCISE: To improve strength and endurance.  Demonstration, verbal and tactile cues throughout for technique. Elliptical L0 x 5' F  THERAPEUTIC ACTIVITIES: To improve functional performance.  Demonstration, verbal and tactile cues throughout for technique. Supine foam roll:  Cervical and thoracic retraction x 20  Scapular depression x 20  Shoulder ER RTB w/ cerv retraction x 20  Shoulder HABD RTB w/ cerv retract x 20  90/90 shoulder ER x 20 BUE  Hands behind head isometric HABD x 20  Towel cervical extension glides x 8 C3-7 Seated towel rotational SNAGs x 10 to L;  x 10 to R at 3 different towel positions on the neck to encompass all vertebrae Pool noodle under neck cervical rotation x 20 each direction for MFR effects  01/06/24 THERAPEUTIC EXERCISE: To improve strength and endurance.  Demonstration, verbal and tactile cues throughout for technique. Elliptical L0 x 6' F  THERAPEUTIC ACTIVITIES: To improve functional performance.  Demonstration, verbal and tactile cues throughout for technique. Seated hamstring stretch x 1'  Seated FABER piriformis x 1' BLE Seated cross over piriformis x 1' BLE SL hip abd x 30 BLE Prone plank x  10 SL plank from knees x 10 B  NEUROMUSCULAR RE-EDUCATION: To improve coordination, kinesthesia, posture, and proprioception. Seated 75 swiss ball Palloff press while holding knee extended blue TB x 2/10 BLE each direction Quadruped fire hydrant w/ TA and lateral YTB pull from PT x 2/10 BLE each direction  MANUAL THERAPY: To promote reduced pain utilizing percussion massage with massage gun. R lumbar paraspinals and quadratus lumborum  percussion massage for pain relief  01/04/24 PT reevaluation/evaluation for cervical spine pain (see notations above and below for subjective notes, objective measures, assessment and goals for the cervical spine)  SELF CARE: Provided education on PT POC progression and initial HEP for cervical spine (see below for detail).   12/28/23 SELF CARE: Provided education on PT POC progression.   PATIENT EDUCATION:  Education details: HEP review and HEP update  Person educated: Patient Education method: Explanation, Demonstration, Verbal cues, Tactile cues, Handouts, and MedBridgeGO app access provided Education comprehension: verbalized understanding, verbal cues required, tactile cues required, and needs further education  HOME EXERCISE PROGRAM: Access Code: [email or web address] URL: [email or web address] Date: 01/17/2024 Prepared by: Garnette Montclair  Exercises - Prone Hip Extension  - 1 x daily - 7 x weekly - 3 sets - 10 reps - Sidelying Hip Abduction  - 1 x daily - 7 x weekly - 3 sets - 10 reps - Side Plank on Knees  - 1 x daily - 7 x weekly - 1 sets - 10 reps - Plank on Knees  - 1 x daily - 7 x weekly - 1 sets - 10 reps - Standard Plank  - 1 x daily - 7 x weekly - 1 sets - 10 reps - Quadruped Fire Hydrant  - 1 x daily - 7 x weekly - 3 sets - 10 reps - Seated Hamstring Stretch  - 1 x daily - 7 x weekly - 1 sets - 2 reps - 1 min hold - Seated Anti-Rotation Press With Anchored Resistance  - 1 x daily - 7 x weekly - 3 sets - 10 reps - Seated  Cervical Sidebending Stretch  - 1 x daily - 7 x weekly - 1 sets - 2 reps - 1 min hold - Mid-Lower Cervical Extension SNAG with Strap  - 1 x daily - 7 x weekly - 1 sets - 10 reps - Supine Cervical Retraction with Towel  - 1 x daily - 7 x weekly - 1 sets - 10 reps - 3 sec hold - Supine Scapular Retraction  - 1 x daily - 7 x weekly - 1 sets - 10 reps - 3 sec hold - Standing Scapular Depression  - 1 x daily - 7 x weekly - 3 sets - 10 reps - Supine Chest Stretch with Elbows Bent  - 1 x daily - 7 x weekly - 1 sets - 10 reps - 3 sec hold - Supine Shoulder External Rotation on Foam Roll with Theraband  - 1 x daily - 7 x weekly - 3 sets - 10 reps - Hooklying Shoulder T  - 1 x daily - 7 x weekly - 3 sets - 10 reps - Supine Shoulder External and Internal Rotation in Abduction with Dumbbell  - 1 x daily - 7 x weekly - 3 sets - 10 reps - Seated Assisted Cervical Rotation with Towel  - 1 x daily - 7 x weekly - 1 sets - 10 reps - Supine PNF D2 Flexion with Resistance  - 1 x daily - 7 x weekly - 3 sets - 10 reps - Snow Angels on Foam Roll  - 1 x daily - 7 x weekly - 3 sets - 10 reps ASSESSMENT:  CLINICAL IMPRESSION: Patient is able to complete most exercises today without much assist.   Occasional cues are required for correct form.  He  feels his pain is improving.  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  The flaring up and improvement cycle is also somewhat consistent with OA pain.   He exhibits considerable cervical spine stiffness with looking up and likely has some foraminal crowding.  He has no recent imaging for the neck.   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  No crepitus is felt with cervical ROM.   He has decreased posterior/anterior vertebral glide mobility from C4-7.  There are no palpable trigger points and nerve tension tests are negative for the LUE.  He  has no radicular symptoms in the LUE.   He demonstrates forward head posture with sloped shoulders and upper thoracic kyphosis.   Errin would benefit from PT to address cervical ROM/stiffness, posture, and pain deficits.    He is agreeable to the POC  BACK EVAL:  Maris Bena is a 68 y.o. male who was referred to physical therapy for evaluation and treatment for acute on chronic LBP   Patient reports onset of  pain beginning 2 weeks ago for no apparent reason.   However, he saw PCP and started celebrex and is feeling better.    Pain is worse with sit to stand transfers and bending over/lifting and sitting or standing prolonged periods of time.  He spends a lot of time in the car and at his desk.  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  He has a slightly longer LLE and a little leftward leaning sway back posture.  Patient has deficits in lumbar ROM, hamstring LE flexibility, BLE strength, abnormal posture with some leftward lean, and residual pain which are interfering with ADLs and are impacting quality of life.  On Modified Oswestry patient scored 19/50 demonstrating 38% or moderate disability.  Corbyn will benefit from skilled PT to address above deficits to improve mobility and activity tolerance with decreased pain interference.   OF note, he is also having neck pain and is going to speak with PCP about a referral to address the cervical spine as well.  We will look for this order  OBJECTIVE IMPAIRMENTS: difficulty walking, decreased ROM, decreased strength, postural dysfunction, and pain.   ACTIVITY LIMITATIONS: lifting, bending, sitting, and standing  PARTICIPATION LIMITATIONS: cleaning, laundry, driving, and community activity  PERSONAL FACTORS: Age and 1-2 comorbidities: DM-II, obesity, elevated BP, GERD, B patellofemoral pain syndrome, B hip pain, h/o R RTC tendinitis, chronic B LBP w/o sciatica, scoliosis, diabetic peripheral neuropathy are also affecting patient's functional  outcome.   REHAB POTENTIAL: Good  CLINICAL DECISION MAKING: Evolving/moderate complexity  EVALUATION COMPLEXITY: Moderate   GOALS: Goals reviewed with patient? Yes  SHORT TERM GOALS: Target date: 01/25/2024   Patient will be independent with initial HEP to improve outcomes and carryover.  Baseline: 100% PT assist required for correct completion 01/04/24:  initial HEP for cervical pain initiated today 11/12:  progressing with initial HEP for cervical spine today Goal status: MET   2.  Patient will report 25% improvement in low back pain to improve QOL. Baseline: 5/10 Goal status: INITIAL  3.  Patient will report 50% subjective improvement in L sided cervical pain   Baseline:  11/12:  2/10  Goal status:  INITIAL on 01/04/24  LONG TERM GOALS: Target date: 02/22/2024   Patient will be independent with  ongoing/advanced HEP for self-management at home.  Baseline: no advanced HEP yet 01/11/24:  advancing Goal status: IN PROGRESS  2.  Patient will report 50-75% improvement in low back pain to improve QOL.  Baseline: 5/10 Goal status: INITIAL  3.  Patient to demonstrate ability to achieve and maintain good spinal alignment/posturing and body mechanics needed for daily activities. Baseline: leftward lean with sway back Goal status: IN PROGRESS  4.  Patient will demonstrate full pain free lumbar ROM to perform ADLs.   Baseline: Refer to above lumbar ROM table Goal status: INITIAL  5.  Patient will demonstrate improved BLE strength to >/= 5/5 for improved stability and ease of mobility. Baseline: Refer to above LE MMT table Goal status: INITIAL  6. Patient will report </= 20% on Modified Oswestry (MCID = 12%) to demonstrate improved functional ability with decreased pain interference. Baseline: 38% Goal status: INITIAL  7.  Patient will tolerate 30 min of (standing/sitting/walking) w/o increased pain to allow for  improved mobility and activity tolerance. Baseline: states  pain begins after 10 min standing/walking Goal status: INITIAL   8.  Patient will demonstrate 60-80% cervical extension to be able to look up to do work around the house     Baseline:  see ROM tables above   Goal status:  INITIAL       9.  Patient will report >/= 75% improvement in neck pain    01/11/24:  2/10 Goal status:  INITIAL PLAN:  PT FREQUENCY: 1-2x/week  PT DURATION: 8 weeks  PLANNED INTERVENTIONS: [id number]- PT Re-evaluation, 97110-Therapeutic exercises, 97530- Therapeutic activity, 97112- Neuromuscular re-education, 97535- Self Care, [id number]- Manual therapy, [id number]- Electrical stimulation (unattended), 97035- Ultrasound, [id number]- Traction (mechanical), [id number]- Ionotophoresis 4mg /ml Dexamethasone , 79439 (1-2 muscles), 20561 (3+ muscles)- Dry Needling, Patient/Family education, Balance training, Taping, Joint mobilization, Spinal mobilization, Cryotherapy, and Moist heat  PLAN FOR NEXT SESSION:  Address LBP next visit.   CERVICAL treatment focusing on flexibility of cervial and thoracic vertebral extension and postural strengthening.   Lumbar treatment focusing on core and lumbar stabilization w/ swiss ball, tall kneel positions and hip abductors and extensors with PPT positions to prevent excessive lordosis posturing.    Tabita Corbo, PT 01/17/2024, 7:55 PM

## 2024-01-17 NOTE — Progress Notes (Signed)
   01/17/2024 Name: Noah Lane MRN: [id number] DOB: 1955-03-03  Chief Complaint  Patient presents with   Medication Management    Noah Lane is a 68 y.o. year old male. Completed a MyChart Visit today   They were referred to the pharmacist by their PCP for assistance in managing medication access and medication adherence   Subjective: Patient is taking Ozempic  to 1mg  weekly.  He has been receiving Ozempic  from [facility] and is approved thru 02/29/2024. He will not be eligible to received Ozempic  for 2026 due to [facility] program changes for Medicare patients. We discussed this at previous appointment.  We received a delivery from [facility] 12/28/2023 for Ozempic  1mg  but it was only 1 pen or 28 day supply.  I contacted [facility] and they sent 1 more pen for last patient until the end of 2025. Additional Ozempic  pen was received 01/05/2024 but patient has not picked up yet.   Medication Access/Adherence  Current Pharmacy:  [facility] [id number] - [place], [place] - [address] [place] [address] Phone: [phone] Fax: [phone]   Patient reports affordability concerns with their medications: Yes  Patient reports access/transportation concerns to their pharmacy: No  Patient reports adherence concerns with their medications:  No    Reviewed refill history:  Olmesartan  20mg  - filled for 90 DS 11/21/2023 Metformin  Er filled for 88 day supply 11/07/2023 Atorvastatin  filled for 90 day supply 08/312025  Patient has refill remaining for above medications for 1 more fill.     Objective:  Lab Results  Component Value Date   HGBA1C 7.1 (H) 08/19/2023    Lab Results  Component Value Date   CREATININE 1.16 08/19/2023   BUN 23 08/19/2023   NA 137 08/19/2023   K 4.3 08/19/2023   CL 102 08/19/2023   CO2 27 08/19/2023    Lab Results  Component Value Date   CHOL 136 08/19/2023   HDL 36.50 (L) 08/19/2023    LDLCALC 65 08/19/2023   LDLDIRECT 132.0 04/22/2022   TRIG 174.0 (H) 08/19/2023   CHOLHDL 4 08/19/2023    Current Outpatient Medications  Medication Instructions   atorvastatin  (LIPITOR) 20 mg, Oral, Daily   celecoxib (CELEBREX) 200 mg, Oral, Daily   ciclopirox  (PENLAC ) 8 % solution Topical, Daily at bedtime, Apply over nail and surrounding skin. Apply daily over previous coat. After seven (7) days, may remove with alcohol and continue cycle.   Continuous Glucose Receiver (FREESTYLE LIBRE 2 READER) DEVI Change every 14 days   metFORMIN  (GLUCOPHAGE -XR) 500 mg, Oral, Daily with breakfast   olmesartan  (BENICAR ) 20 mg, Oral, Daily   PRESCRIPTION MEDICATION 4 mLs, As needed   Semaglutide  (1 MG/DOSE) 1 mg, Injection, Weekly      Assessment/Plan:   Medication Access:  - Discussed 2026 plans for Ozempic  at last visit. Patient has spoken to his insurance representative with his past employer but has not chosen a plan yet. He will let me know if he has any problems with cost of Ozempic  in 2026.   Patient was to see PCP in 3 to 6 months after his last appointment - due for follow up. Send message to scheduler to reach out to make appointment.   Follow up in 2 to 3 weeks to check adherence / Ozempic    Madelin Ray, PharmD Clinical Pharmacist [facility]

## 2024-01-18 NOTE — Progress Notes (Signed)
 mYCHART MESSAGED PT

## 2024-01-19 ENCOUNTER — Ambulatory Visit: Admitting: Rehabilitation

## 2024-01-20 ENCOUNTER — Encounter (HOSPITAL_BASED_OUTPATIENT_CLINIC_OR_DEPARTMENT_OTHER): Payer: Self-pay

## 2024-01-24 ENCOUNTER — Ambulatory Visit: Admitting: Rehabilitation

## 2024-01-24 ENCOUNTER — Telehealth (HOSPITAL_BASED_OUTPATIENT_CLINIC_OR_DEPARTMENT_OTHER): Payer: Self-pay | Admitting: *Deleted

## 2024-01-24 NOTE — Telephone Encounter (Signed)
 CMN received for CPAP supplies signed by provider and faxed confirmation received [id number]

## 2024-01-25 ENCOUNTER — Ambulatory Visit: Admitting: Rehabilitation

## 2024-01-25 NOTE — Therapy (Incomplete)
 OUTPATIENT PHYSICAL THERAPY LUMBAR TREATMENT   Patient Name: Noah Lane MRN: [id number] DOB:[birth year], 68 y.o., male Today's Date: 01/25/2024  END OF SESSION:     Past Medical History:  Diagnosis Date   Broken toe    Diabetes mellitus type 2 in obese    Mixed hyperlipidemia    Past Surgical History:  Procedure Laterality Date   COLON RESECTION N/A 04/27/2022   Procedure: LAPAROSCOPIC HAND ASSISTED RIGHT HEMI COLECTOMY, POSSIBLE OPEN;  Surgeon: Dasie Leonor CROME, MD;  Location: [facility];  Service: General;  Laterality: N/A;   COLONOSCOPY     HERNIA REPAIR  2012   Patient Active Problem List   Diagnosis Date Noted   Elevated blood pressure reading 05/04/2022   Colon adenocarcinoma (HCC) 05/04/2022   Gastroesophageal reflux disease 04/29/2022   Ileus, postoperative (HCC) 04/29/2022   Hypokalemia 04/28/2022   Hyperlipidemia 04/28/2022   Colonic obstruction (HCC) 04/25/2022   Hyponatremia 04/25/2022   OSA on CPAP 12/03/2021   Obesity (BMI 30.0-34.9) 12/03/2021   Mixed hyperlipidemia 10/20/2021   Strain of lumbar region 10/16/2021   Strain of calf muscle 07/22/2021   Patellofemoral pain syndrome of both knees 02/24/2021   Rotator cuff tendinitis, right 12/28/2019   Hip pain, bilateral 05/28/2019   Chronic bilateral low back pain without sciatica 05/28/2019   Type 2 diabetes mellitus with obesity 05/28/2019   Erectile dysfunction 05/28/2019   Pain and swelling of left knee 03/12/2019   Lateral epicondylitis, right elbow 03/15/2017   Diverticulosis of large intestine without hemorrhage 02/25/2014    PCP: Frann Mabel Mt, DO   REFERRING PROVIDER: Dorina Loving, PA-C   REFERRING DIAG: M54.50 (ICD-10-CM) - Acute midline low back pain without sciatica  THERAPY DIAG:  No diagnosis found.  RATIONALE FOR EVALUATION AND TREATMENT: Rehabilitation  ONSET DATE: 2 weeks ago  NEXT MD VISIT:    SUBJECTIVE:                                                                                                                                                                                                          SUBJECTIVE STATEMENT: States neck and back pain were improved over the weekend.   States he was able to do some lifting, carrying, shoveling around the house without hurting.   Rates neck pain today 2/10 on the L side  01/04/24 EVAL CERVICAL PAIN:  Patient is reevaluated today for cervical pain.  He has a new order from his PCP for neck pain evaluate/treat.   He reports the pain started several weeks ago for no apparent reason.  He states the pain was on the L side of his cervical spine and top of the L shoulder/upper trap area.   He isn't sure what precipitated it.   Denies any trauma recently.  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   He has had off/on neck pain since that time, but usually gets better.   He states he is feeling better with the neck pain this week than he did last week.   Pain level is only minimal now.   He is still working as a ecologist and admits that he has a fair amount of stress which he feels may contribute to his neck pain.   He does a lot of sitting and driving with his job.   He denies any pain, numbness, tingling into the L arm.    EVAL LBP: 68 y/o patient referred to PT for LBP.   He is well known to us  from recent episodes of PT for this issue with the latest being about 2 months ago.  Patient states 2 weeks ago he started having severe R sided LBP with any movement.   He reports the pain was severe initially, but is now doing better with taking Celebrex .  States he can't recall any injury that would have precipitated this.  He states that he is currently having a lot of soreness now, but it is not constant.  States feels it more with sit to stand transfers.  Played golf 3-4 weeks ago, but doesn't think this aggravated him.  Doing piriformis stretching and lying flat  helps him some.   He has had a back injection many years ago, but nothing since then.  No MRI.  Old xray shows DDD, facet arthropathy.    He does have a long history of off/on back pain with difficulty playing golf at times, which he likes to do.   However he states he is not going to try to play anymore this year, and is going to try to focus on exercises to improve his back pain and prevent reinjury  PAIN: Are you having pain? Yes: NPRS scale: 5/10 now and worst over last week.    Pain location: central low back now Pain description: soreness most of the time; sharp at times Aggravating factors: lifting,  Relieving factors: lying flat  PERTINENT HISTORY:  DM-II, obesity, elevated BP, GERD, B patellofemoral pain syndrome, B hip pain, h/o R RTC tendinitis, chronic B LBP w/o sciatica, scoliosis, diabetic peripheral neuropathy   PRECAUTIONS: None  RED FLAGS: None  WEIGHT BEARING RESTRICTIONS: No  FALLS:  Has patient fallen in last 6 months? No  LIVING ENVIRONMENT: Lives with: lives with their family and lives with their spouse Lives in: House/apartment Stairs: Yes: Internal: 14 steps; on left going up and External: 1 steps; unknown Has following equipment at home: Single point cane and Environmental Consultant - 2 wheeled  OCCUPATION: pharmacist, hospital, mostly seated desk work and driving  PLOF: Independent  PATIENT GOALS: improve back pain and prevent further flareups/reinjuries   OBJECTIVE: (objective measures completed at initial evaluation unless otherwise dated)  DIAGNOSTIC FINDINGS:  EXAM: LUMBAR SPINE - 2-3 VIEW   COMPARISON:  None Available.   FINDINGS: Evaluation is limited due to body habitus.   Five lumbar type vertebra. There is no acute fracture or subluxation of the lumbar spine. There is multilevel degenerative changes with disc space narrowing, endplate irregularity and spurring/osteophyte.  Multilevel facet arthropathy. The visualized posterior  elements appear intact. There is atherosclerotic calcification of the aorta. The soft tissues are grossly unremarkable.   IMPRESSION: No acute fracture or subluxation of the lumbar spine.     Electronically Signed   By: Vanetta Chou M.D.   On: 10/18/2021 20:41  PATIENT SURVEYS:  Modified Oswestry:  MODIFIED OSWESTRY DISABILITY SCALE  Date: 01/25/2024 Score  Pain intensity 2 =  Pain medication provides me with complete relief from pain.  2. Personal care (washing, dressing, etc.) 1 =  I can take care of myself normally, but it increases my pain.  3. Lifting 4 = I can lift only very light weights  4. Walking 1 = Pain prevents me from walking more than 1 mile.  5. Sitting 2 =  Pain prevents me from sitting more than 1 hour.  6. Standing 4 =  Pain prevents me from standing more than 10 minutes.  7. Sleeping 1 = I can sleep well only by using pain medication.  8. Social Life 1 =  My social life is normal, but it increases my level of pain.  9. Traveling 1 =  I can travel anywhere, but it increases my pain.  10. Employment/ Homemaking 2 = I can perform most of my homemaking/job duties, but pain prevents me from performing more physically stressful activities (eg, lifting, vacuuming).  Total 19/50   Interpretation of scores: Score Category Description  0-20% Minimal Disability The patient can cope with most living activities. Usually no treatment is indicated apart from advice on lifting, sitting and exercise  21-40% Moderate Disability The patient experiences more pain and difficulty with sitting, lifting and standing. Travel and social life are more difficult and they may be disabled from work. Personal care, sexual activity and sleeping are not grossly affected, and the patient can usually be managed by conservative means  41-60% Severe Disability Pain remains the main problem in this group, but activities of daily living are affected. These patients require a detailed investigation   61-80% Crippled Back pain impinges on all aspects of the patient's life. Positive intervention is required  81-100% Bed-bound These patients are either bed-bound or exaggerating their symptoms  Bluford FORBES Zoe DELENA Karon DELENA, et al. Surgery versus conservative management of stable thoracolumbar fracture: the PRESTO feasibility RCT. [place] ([place]): [employer]; 2021 Nov. [facility] Technology Assessment, No. 25.62.) Appendix 3, Oswestry Disability Index category descriptors. Available from: Findjewelers.cz  Minimally Clinically Important Difference (MCID) = 12.8%   NDI:  NECK DISABILITY INDEX  Date: 01/25/2024 Score  Pain intensity 3 = The pain is fairly severe at the moment  2. Personal care (washing, dressing, etc.) 1 =  I can look after myself normally but it causes extra pain  3. Lifting 3 = Pain prevents me from lifting heavy weights but I can manage light to medium   weights if they are conveniently positioned  4. Reading 2 =  I can read as much as I want with moderate pain in my neck  5. Headaches 2 =  I have moderate headaches, which come infrequently  6. Concentration 1 =  I can concentrate fully when I want to with slight difficulty   7. Work 1 =  I can only do my usual work, but no more  8. Driving 1 =  I can drive my car as long as I want with slight pain in my neck  9. Sleeping 1 = My sleep is slightly disturbed (less  than 1 hr sleepless)  10. Recreation 3 = I am able to engage in a few of my usual recreation activities because of pain in   my neck  Total 18/50 = 36%   Minimum Detectable Change (90% confidence): 5 points or 10% points   SCREENING FOR RED FLAGS: Bowel or bladder incontinence: No Spinal tumors: No Cauda equina syndrome: No Compression fracture: No Abdominal aneurysm: No  COGNITION:  Overall cognitive status: Within functional limits for tasks assessed    SENSATION: WFL  POSTURE:  Has some Leftward scoliosis  present;   increased lumbar extension/sway position   Supine:  Equal pelvic landmarks;  longer LLE at the malleolus by a small amount   PALPATION: Not specifically painful;   deep palpation of lumbar paraspinals and QL are nonpainful Lumbar posterior/anterior glides and rotational glides are non painful No palpation reproduces his pain  01/04/24:  neck eval:  TTP over the C4-7 transverse processes;  no palpable trigger points throughout L neck;  P/A vertebral glides are some restricted C4-7.  Side glides and rotational glides feel fairly normal  LUMBAR ROM:   Active  Eval  Flexion To knees; some end range pain  Extension 100%  Right lateral flexion To upper thigh;  stiff feeling  Left lateral flexion To upper thigh; stiff feeling  Right rotation 75%; stiff  Left rotation 100%  (Blank rows = not tested)  CERVICAL ROM: 01/04/24  Active ROM A/PROM (deg) eval  Flexion 90%  Extension 45%  Right lateral flexion 50%  Left lateral flexion 50%  Right rotation 80%  Left rotation 75%    No motion is especially painful   MUSCLE LENGTH: Hamstrings: Right SLR = 80 deg; Left SLR = 50 deg Hamstrings: very tight L, mild tight R ITB: NT Piriformis: a little tight BLE Hip flexors: not tight Quads: NT Heelcord: NT  LOWER EXTREMITY ROM:     Active  Right eval Left eval  Hip flexion    Hip extension    Hip abduction    Hip adduction    Hip internal rotation    Hip external rotation    Knee flexion    Knee extension    Ankle dorsiflexion    Ankle plantarflexion    Ankle inversion    Ankle eversion    (Blank rows = not tested)  LOWER EXTREMITY MMT:    MMT Right eval Left eval  Hip flexion 4+ 4  Hip extension 3+ 4-  Hip abduction 4- 4  Hip adduction    Hip internal rotation 4 4-  Hip external rotation 4+ 4  Knee flexion 4 4-  Knee extension 5 4+  Ankle dorsiflexion 5 5  Ankle plantarflexion 5 5  Ankle inversion    Ankle eversion     (Blank rows = not  tested)  LUMBAR SPECIAL TESTS:  Straight leg raise test: Negative, Slump test: Negative, and Long sit test: Negative   Longer L malleolus but no change with long sit test;  Debby test is good flexibility, but increases his back pain for BLE (denies any SI pain)  CERVICAL SPECIAL TESTS: 01/04/24 Upper limb tension test (ULTT): Negative negative for median, radial, and ulnar nerves and unprovoked with cervical sidebending or rotation or both   FUNCTIONAL TESTS:  TBD if needed  GAIT: Distance walked: into clinic x 200' Assistive device utilized: None Level of assistance: Complete Independence Gait pattern: significant toe out BLE nearly contacting his other heel on swing phase of gait Comments:  no limping noted   TODAY'S TREATMENT:  01/17/24 NEUROMUSCULAR RE-EDUCATION: To improve coordination, kinesthesia, posture, and proprioception. Foam roll lying:  Shoulder ER w/ cerv retract w/ RTB x 20 BUE  Shoulder HABD w/ cerv retract RTB x 20 BUE  Snow angels w/ cerv retract x 20 BUE  Scapular depression w/ cerv retraction x 20 BUE  D2 PNF extension YTB w/ cerv retraction x 20 BUE  90/90 shoulder ER 1# x 20 BUE  Horizontal foam roll upper thoracic mobilizations x 2/10 hands behind head   MANUAL THERAPY: To promote improved joint mobility and reduced pain utilizing joint mobilization and myofascial release. Grade 3-4 jt mobilizations to C3-7 for P/A glides and rotations R x 20 reps for C6-7;    suboccipital release  01/13/24 THERAPEUTIC EXERCISE: To improve strength and endurance.  Demonstration, verbal and tactile cues throughout for technique. Elliptical L0 x 6' F  THERAPEUTIC ACTIVITIES: To improve functional performance.  Demonstration, verbal and tactile cues throughout for technique. Seated hamstring stretch x 1'  Seated FABER piriformis x 1' BLE SL hip abd x 20 BLE Prone plank x 10 SL plank from knees x 10 B Ab curl up/reach x 2/10  NEUROMUSCULAR RE-EDUCATION: To improve  coordination, kinesthesia, posture, and proprioception. Seated 75 swiss ball Palloff press while holding knee extended blue TB x 2/10 BLE each direction Supine bridge w/ palloff press x 2/10 BUE  MANUAL THERAPY: To promote reduced pain utilizing percussion massage with massage gun. R lumbar paraspinals and quadratus lumborum and posterolateral R hip percussion massage for pain relief  01/11/24 THERAPEUTIC EXERCISE: To improve strength and endurance.  Demonstration, verbal and tactile cues throughout for technique. Elliptical L0 x 5' F  THERAPEUTIC ACTIVITIES: To improve functional performance.  Demonstration, verbal and tactile cues throughout for technique. Supine foam roll:  Cervical and thoracic retraction x 20  Scapular depression x 20  Shoulder ER RTB w/ cerv retraction x 20  Shoulder HABD RTB w/ cerv retract x 20  90/90 shoulder ER x 20 BUE  Hands behind head isometric HABD x 20  Towel cervical extension glides x 8 C3-7 Seated towel rotational SNAGs x 10 to L;  x 10 to R at 3 different towel positions on the neck to encompass all vertebrae Pool noodle under neck cervical rotation x 20 each direction for MFR effects  01/06/24 THERAPEUTIC EXERCISE: To improve strength and endurance.  Demonstration, verbal and tactile cues throughout for technique. Elliptical L0 x 6' F  THERAPEUTIC ACTIVITIES: To improve functional performance.  Demonstration, verbal and tactile cues throughout for technique. Seated hamstring stretch x 1'  Seated FABER piriformis x 1' BLE Seated cross over piriformis x 1' BLE SL hip abd x 30 BLE Prone plank x 10 SL plank from knees x 10 B  NEUROMUSCULAR RE-EDUCATION: To improve coordination, kinesthesia, posture, and proprioception. Seated 75 swiss ball Palloff press while holding knee extended blue TB x 2/10 BLE each direction Quadruped fire hydrant w/ TA and lateral YTB pull from PT x 2/10 BLE each direction  MANUAL THERAPY: To promote reduced pain  utilizing percussion massage with massage gun. R lumbar paraspinals and quadratus lumborum  percussion massage for pain relief  01/04/24 PT reevaluation/evaluation for cervical spine pain (see notations above and below for subjective notes, objective measures, assessment and goals for the cervical spine)  SELF CARE: Provided education on PT POC progression and initial HEP for cervical spine (see below for detail).   12/28/23 SELF CARE: Provided education on PT POC progression.  PATIENT EDUCATION:  Education details: HEP review and HEP update  Person educated: Patient Education method: Explanation, Demonstration, Verbal cues, Tactile cues, Handouts, and MedBridgeGO app access provided Education comprehension: verbalized understanding, verbal cues required, tactile cues required, and needs further education  HOME EXERCISE PROGRAM: Access Code: [email or web address] URL: [email or web address] Date: 01/17/2024 Prepared by: Garnette Montclair  Exercises - Prone Hip Extension  - 1 x daily - 7 x weekly - 3 sets - 10 reps - Sidelying Hip Abduction  - 1 x daily - 7 x weekly - 3 sets - 10 reps - Side Plank on Knees  - 1 x daily - 7 x weekly - 1 sets - 10 reps - Plank on Knees  - 1 x daily - 7 x weekly - 1 sets - 10 reps - Standard Plank  - 1 x daily - 7 x weekly - 1 sets - 10 reps - Quadruped Fire Hydrant  - 1 x daily - 7 x weekly - 3 sets - 10 reps - Seated Hamstring Stretch  - 1 x daily - 7 x weekly - 1 sets - 2 reps - 1 min hold - Seated Anti-Rotation Press With Anchored Resistance  - 1 x daily - 7 x weekly - 3 sets - 10 reps - Seated Cervical Sidebending Stretch  - 1 x daily - 7 x weekly - 1 sets - 2 reps - 1 min hold - Mid-Lower Cervical Extension SNAG with Strap  - 1 x daily - 7 x weekly - 1 sets - 10 reps - Supine Cervical Retraction with Towel  - 1 x daily - 7 x weekly - 1 sets - 10 reps - 3 sec hold - Supine Scapular Retraction  - 1 x daily - 7 x weekly - 1 sets - 10 reps - 3 sec  hold - Standing Scapular Depression  - 1 x daily - 7 x weekly - 3 sets - 10 reps - Supine Chest Stretch with Elbows Bent  - 1 x daily - 7 x weekly - 1 sets - 10 reps - 3 sec hold - Supine Shoulder External Rotation on Foam Roll with Theraband  - 1 x daily - 7 x weekly - 3 sets - 10 reps - Hooklying Shoulder T  - 1 x daily - 7 x weekly - 3 sets - 10 reps - Supine Shoulder External and Internal Rotation in Abduction with Dumbbell  - 1 x daily - 7 x weekly - 3 sets - 10 reps - Seated Assisted Cervical Rotation with Towel  - 1 x daily - 7 x weekly - 1 sets - 10 reps - Supine PNF D2 Flexion with Resistance  - 1 x daily - 7 x weekly - 3 sets - 10 reps - Snow Angels on Foam Roll  - 1 x daily - 7 x weekly - 3 sets - 10 reps ASSESSMENT:  CLINICAL IMPRESSION: Patient is able to complete most exercises today without much assist.   Occasional cues are required for correct form.  He feels his pain is improving.  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  The flaring up and improvement cycle is also somewhat consistent with OA pain.   He exhibits considerable cervical spine stiffness with looking up and likely has some foraminal crowding.  He has no recent imaging for the neck.  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  No crepitus is felt with cervical ROM.   He has decreased posterior/anterior vertebral glide mobility from C4-7.  There are no palpable trigger points and nerve tension tests are negative for the LUE.  He has no radicular symptoms in the LUE.   He demonstrates forward head posture with sloped shoulders and upper thoracic kyphosis.   Mont would benefit from PT to address cervical ROM/stiffness, posture, and pain deficits.    He is agreeable to the POC  BACK EVAL:  Shonte Soderlund is a 68 y.o. male who was referred to physical therapy for evaluation and  treatment for acute on chronic LBP   Patient reports onset of  pain beginning 2 weeks ago for no apparent reason.   However, he saw PCP and started celebrex  and is feeling better.    Pain is worse with sit to stand transfers and bending over/lifting and sitting or standing prolonged periods of time.  He spends a lot of time in the car and at his desk.  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  He has a slightly longer LLE and a little leftward leaning sway back posture.  Patient has deficits in lumbar ROM, hamstring LE flexibility, BLE strength, abnormal posture with some leftward lean, and residual pain which are interfering with ADLs and are impacting quality of life.  On Modified Oswestry patient scored 19/50 demonstrating 38% or moderate disability.  Burdette will benefit from skilled PT to address above deficits to improve mobility and activity tolerance with decreased pain interference.   OF note, he is also having neck pain and is going to speak with PCP about a referral to address the cervical spine as well.  We will look for this order  OBJECTIVE IMPAIRMENTS: difficulty walking, decreased ROM, decreased strength, postural dysfunction, and pain.   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   REHAB POTENTIAL: Good  CLINICAL DECISION MAKING: Evolving/moderate complexity  EVALUATION COMPLEXITY: Moderate   GOALS: Goals reviewed with patient? Yes  SHORT TERM GOALS: Target date: 01/25/2024   Patient will be independent with initial HEP to improve outcomes and carryover.  Baseline: 100% PT assist required for correct  completion 01/04/24:  initial HEP for cervical pain initiated today 11/12:  progressing with initial HEP for cervical spine today Goal status: MET   2.  Patient will report 25% improvement in low back pain to improve QOL. Baseline: 5/10 Goal status: INITIAL  3.  Patient will report 50% subjective improvement in L sided cervical pain   Baseline:  11/12:  2/10  Goal status:  INITIAL on 01/04/24  LONG TERM GOALS: Target date: 02/22/2024   Patient will be independent with ongoing/advanced HEP for self-management at home.  Baseline: no advanced HEP yet 01/11/24:  advancing Goal status: IN PROGRESS  2.  Patient will report 50-75% improvement in low back pain to improve QOL.  Baseline: 5/10 Goal status: INITIAL  3.  Patient to demonstrate ability to achieve and maintain good spinal alignment/posturing and body mechanics needed for daily activities. Baseline: leftward lean with sway back Goal status: IN PROGRESS  4.  Patient will demonstrate full pain free lumbar ROM to  perform ADLs.   Baseline: Refer to above lumbar ROM table Goal status: INITIAL  5.  Patient will demonstrate improved BLE strength to >/= 5/5 for improved stability and ease of mobility. Baseline: Refer to above LE MMT table Goal status: INITIAL  6. Patient will report </= 20% on Modified Oswestry (MCID = 12%) to demonstrate improved functional ability with decreased pain interference. Baseline: 38% Goal status: INITIAL  7.  Patient will tolerate 30 min of (standing/sitting/walking) w/o increased pain to allow for  improved mobility and activity tolerance. Baseline: states pain begins after 10 min standing/walking Goal status: INITIAL   8.  Patient will demonstrate 60-80% cervical extension to be able to look up to do work around the house     Baseline:  see ROM tables above   Goal status:  INITIAL       9.  Patient will report >/= 75% improvement in neck pain    01/11/24:  2/10 Goal status:   INITIAL PLAN:  PT FREQUENCY: 1-2x/week  PT DURATION: 8 weeks  PLANNED INTERVENTIONS: 97164- PT Re-evaluation, 97110-Therapeutic exercises, 97530- Therapeutic activity, 97112- Neuromuscular re-education, 97535- Self Care, [id number]- Manual therapy, [id number]- Electrical stimulation (unattended), 97035- Ultrasound, [id number]- Traction (mechanical), [id number]- Ionotophoresis 4mg /ml Dexamethasone , 79439 (1-2 muscles), 20561 (3+ muscles)- Dry Needling, Patient/Family education, Balance training, Taping, Joint mobilization, Spinal mobilization, Cryotherapy, and Moist heat  PLAN FOR NEXT SESSION:  Address LBP next visit.   CERVICAL treatment focusing on flexibility of cervial and thoracic vertebral extension and postural strengthening.   Lumbar treatment focusing on core and lumbar stabilization w/ swiss ball, tall kneel positions and hip abductors and extensors with PPT positions to prevent excessive lordosis posturing.    Jestin Burbach, PT 01/25/2024, 8:05 AM

## 2024-01-31 ENCOUNTER — Encounter (HOSPITAL_BASED_OUTPATIENT_CLINIC_OR_DEPARTMENT_OTHER): Payer: Self-pay

## 2024-01-31 ENCOUNTER — Ambulatory Visit: Admitting: Rehabilitation

## 2024-01-31 DIAGNOSIS — G4733 Obstructive sleep apnea (adult) (pediatric): Secondary | ICD-10-CM

## 2024-02-01 ENCOUNTER — Ambulatory Visit: Attending: Medical | Admitting: Rehabilitation

## 2024-02-01 ENCOUNTER — Encounter: Payer: Self-pay | Admitting: Rehabilitation

## 2024-02-01 DIAGNOSIS — M6281 Muscle weakness (generalized): Secondary | ICD-10-CM | POA: Diagnosis present

## 2024-02-01 DIAGNOSIS — M542 Cervicalgia: Secondary | ICD-10-CM | POA: Insufficient documentation

## 2024-02-01 DIAGNOSIS — M5459 Other low back pain: Secondary | ICD-10-CM | POA: Diagnosis present

## 2024-02-01 DIAGNOSIS — M5412 Radiculopathy, cervical region: Secondary | ICD-10-CM | POA: Insufficient documentation

## 2024-02-01 DIAGNOSIS — R293 Abnormal posture: Secondary | ICD-10-CM | POA: Diagnosis present

## 2024-02-01 NOTE — Therapy (Signed)
 OUTPATIENT PHYSICAL THERAPY LUMBAR TREATMENT   Patient Name: Noah Lane MRN: [id number] DOB:[birth year], 68 y.o., male Today's Date: 02/01/2024  END OF SESSION:  PT End of Session - 02/01/24 0851     Visit Number 7    Date for Recertification  02/22/24    PT Start Time 0850    PT Stop Time 0930    PT Time Calculation (min) 40 min    Activity Tolerance Patient tolerated treatment well;No increased pain    Behavior During Therapy WFL for tasks assessed/performed            Past Medical History:  Diagnosis Date   Broken toe    Diabetes mellitus type 2 in obese    Mixed hyperlipidemia    Past Surgical History:  Procedure Laterality Date   COLON RESECTION N/A 04/27/2022   Procedure: LAPAROSCOPIC HAND ASSISTED RIGHT HEMI COLECTOMY, POSSIBLE OPEN;  Surgeon: Dasie Leonor CROME, MD;  Location: [facility];  Service: General;  Laterality: N/A;   COLONOSCOPY     HERNIA REPAIR  2012   Patient Active Problem List   Diagnosis Date Noted   Elevated blood pressure reading 05/04/2022   Colon adenocarcinoma (HCC) 05/04/2022   Gastroesophageal reflux disease 04/29/2022   Ileus, postoperative (HCC) 04/29/2022   Hypokalemia 04/28/2022   Hyperlipidemia 04/28/2022   Colonic obstruction (HCC) 04/25/2022   Hyponatremia 04/25/2022   OSA on CPAP 12/03/2021   Obesity (BMI 30.0-34.9) 12/03/2021   Mixed hyperlipidemia 10/20/2021   Strain of lumbar region 10/16/2021   Strain of calf muscle 07/22/2021   Patellofemoral pain syndrome of both knees 02/24/2021   Rotator cuff tendinitis, right 12/28/2019   Hip pain, bilateral 05/28/2019   Chronic bilateral low back pain without sciatica 05/28/2019   Type 2 diabetes mellitus with obesity 05/28/2019   Erectile dysfunction 05/28/2019   Pain and swelling of left knee 03/12/2019   Lateral epicondylitis, right elbow 03/15/2017   Diverticulosis of large intestine without hemorrhage 02/25/2014    PCP: Frann Mabel Mt, DO   REFERRING PROVIDER:  Dorina Loving, PA-C   REFERRING DIAG: M54.50 (ICD-10-CM) - Acute midline low back pain without sciatica  THERAPY DIAG:  Muscle weakness (generalized)  Abnormal posture  Neck pain  Other low back pain  Radiculopathy, cervical region  RATIONALE FOR EVALUATION AND TREATMENT: Rehabilitation  ONSET DATE: 2 weeks ago  NEXT MD VISIT:    SUBJECTIVE:  SUBJECTIVE STATEMENT: Patient reports back has been bothering him more for the last week.  Rates back pain 2-3/10 today  01/04/24 EVAL CERVICAL PAIN:  Patient is reevaluated today for cervical pain.  He has a new order from his PCP for neck pain evaluate/treat.   He reports the pain started several weeks ago for no apparent reason.  He states the pain was on the L side of his cervical spine and top of the L shoulder/upper trap area.   He isn't sure what precipitated it.   Denies any trauma recently.  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   He has had off/on neck pain since that time, but usually gets better.   He states he is feeling better with the neck pain this week than he did last week.   Pain level is only minimal now.   He is still working as a ecologist and admits that he has a fair amount of stress which he feels may contribute to his neck pain.   He does a lot of sitting and driving with his job.   He denies any pain, numbness, tingling into the L arm.    EVAL LBP: 68 y/o patient referred to PT for LBP.   He is well known to us  from recent episodes of PT for this issue with the latest being about 2 months ago.  Patient states 2 weeks ago he started having severe R sided LBP with any movement.   He reports the pain was severe initially, but is now doing better with taking  Celebrex .  States he can't recall any injury that would have precipitated this.  He states that he is currently having a lot of soreness now, but it is not constant.  States feels it more with sit to stand transfers.  Played golf 3-4 weeks ago, but doesn't think this aggravated him.  Doing piriformis stretching and lying flat helps him some.   He has had a back injection many years ago, but nothing since then.  No MRI.  Old xray shows DDD, facet arthropathy.    He does have a long history of off/on back pain with difficulty playing golf at times, which he likes to do.   However he states he is not going to try to play anymore this year, and is going to try to focus on exercises to improve his back pain and prevent reinjury  PAIN: Are you having pain? Yes: NPRS scale: 5/10 now and worst over last week.    Pain location: central low back now Pain description: soreness most of the time; sharp at times Aggravating factors: lifting,  Relieving factors: lying flat  PERTINENT HISTORY:  DM-II, obesity, elevated BP, GERD, B patellofemoral pain syndrome, B hip pain, h/o R RTC tendinitis, chronic B LBP w/o sciatica, scoliosis, diabetic peripheral neuropathy   PRECAUTIONS: None  RED FLAGS: None  WEIGHT BEARING RESTRICTIONS: No  FALLS:  Has patient fallen in last 6 months? No  LIVING ENVIRONMENT: Lives with: lives with their family and lives with their spouse Lives in: House/apartment Stairs: Yes: Internal: 14 steps; on left going up and External: 1 steps; unknown Has following equipment at home: Single point cane and Walker - 2 wheeled  OCCUPATION: pharmacist, hospital, mostly seated desk work and driving  PLOF: Independent  PATIENT GOALS: improve back pain and prevent further flareups/reinjuries   OBJECTIVE: (objective measures completed at  initial evaluation unless otherwise dated)  DIAGNOSTIC FINDINGS:  EXAM: LUMBAR SPINE - 2-3 VIEW   COMPARISON:  None Available.    FINDINGS: Evaluation is limited due to body habitus.   Five lumbar type vertebra. There is no acute fracture or subluxation of the lumbar spine. There is multilevel degenerative changes with disc space narrowing, endplate irregularity and spurring/osteophyte. Multilevel facet arthropathy. The visualized posterior elements appear intact. There is atherosclerotic calcification of the aorta. The soft tissues are grossly unremarkable.   IMPRESSION: No acute fracture or subluxation of the lumbar spine.     Electronically Signed   By: Vanetta Chou M.D.   On: 10/18/2021 20:41  PATIENT SURVEYS:  Modified Oswestry:  MODIFIED OSWESTRY DISABILITY SCALE 02/01/24  Date: 02/01/2024 Score   Pain intensity 2 =  Pain medication provides me with complete relief from pain. 2  2. Personal care (washing, dressing, etc.) 1 =  I can take care of myself normally, but it increases my pain. 0  3. Lifting 4 = I can lift only very light weights 3  4. Walking 1 = Pain prevents me from walking more than 1 mile. 1  5. Sitting 2 =  Pain prevents me from sitting more than 1 hour. 2  6. Standing 4 =  Pain prevents me from standing more than 10 minutes. 2  7. Sleeping 1 = I can sleep well only by using pain medication. 1  8. Social Life 1 =  My social life is normal, but it increases my level of pain. 0  9. Traveling 1 =  I can travel anywhere, but it increases my pain. 1  10. Employment/ Homemaking 2 = I can perform most of my homemaking/job duties, but pain prevents me from performing more physically stressful activities (eg, lifting, vacuuming). 1  Total 19/50 13/50 = 26%   Interpretation of scores: Score Category Description  0-20% Minimal Disability The patient can cope with most living activities. Usually no treatment is indicated apart from advice on lifting, sitting and exercise  21-40% Moderate Disability The patient experiences more pain and difficulty with sitting, lifting and standing. Travel and  social life are more difficult and they may be disabled from work. Personal care, sexual activity and sleeping are not grossly affected, and the patient can usually be managed by conservative means  41-60% Severe Disability Pain remains the main problem in this group, but activities of daily living are affected. These patients require a detailed investigation  61-80% Crippled Back pain impinges on all aspects of the patient's life. Positive intervention is required  81-100% Bed-bound These patients are either bed-bound or exaggerating their symptoms  Bluford FORBES Zoe DELENA Karon DELENA, et al. Surgery versus conservative management of stable thoracolumbar fracture: the PRESTO feasibility RCT. [place] ([place]): [employer]; 2021 Nov. [facility] [employer] Assessment, No. 25.62.) Appendix 3, Oswestry Disability Index category descriptors. Available from: Findjewelers.cz  Minimally Clinically Important Difference (MCID) = 12.8%   NDI:  NECK DISABILITY INDEX 02/01/24  Date: 02/01/2024 Score   Pain intensity 3 = The pain is fairly severe at the moment 1  2. Personal care (washing, dressing, etc.) 1 =  I can look after myself normally but it causes extra pain 0  3. Lifting 3 = Pain prevents me from lifting heavy weights but I can manage light to medium   weights if they are conveniently positioned 3  4. Reading 2 =  I can read as much as I want with moderate pain in my  neck 0  5. Headaches 2 =  I have moderate headaches, which come infrequently 2  6. Concentration 1 =  I can concentrate fully when I want to with slight difficulty  1  7. Work 1 =  I can only do my usual work, but no more 1  8. Driving 1 =  I can drive my car as long as I want with slight pain in my neck 1  9. Sleeping 1 = My sleep is slightly disturbed (less than 1 hr sleepless) 2  10. Recreation 3 = I am able to engage in a few of my usual recreation activities because of pain in   my neck 3  Total 18/50  = 36% 14/50 = 28   Minimum Detectable Change (90% confidence): 5 points or 10% points   SCREENING FOR RED FLAGS: Bowel or bladder incontinence: No Spinal tumors: No Cauda equina syndrome: No Compression fracture: No Abdominal aneurysm: No  COGNITION:  Overall cognitive status: Within functional limits for tasks assessed    SENSATION: WFL  POSTURE:  Has some Leftward scoliosis present;   increased lumbar extension/sway position   Supine:  Equal pelvic landmarks;  longer LLE at the malleolus by a small amount   PALPATION: Not specifically painful;   deep palpation of lumbar paraspinals and QL are nonpainful Lumbar posterior/anterior glides and rotational glides are non painful No palpation reproduces his pain  01/04/24:  neck eval:  TTP over the C4-7 transverse processes;  no palpable trigger points throughout L neck;  P/A vertebral glides are some restricted C4-7.  Side glides and rotational glides feel fairly normal  LUMBAR ROM:   Active  Eval  Flexion To knees; some end range pain  Extension 100%  Right lateral flexion To upper thigh;  stiff feeling  Left lateral flexion To upper thigh; stiff feeling  Right rotation 75%; stiff  Left rotation 100%  (Blank rows = not tested)  CERVICAL ROM: 01/04/24  Active ROM A/PROM (deg) eval  Flexion 90%  Extension 45%  Right lateral flexion 50%  Left lateral flexion 50%  Right rotation 80%  Left rotation 75%    No motion is especially painful   MUSCLE LENGTH: Hamstrings: Right SLR = 80 deg; Left SLR = 50 deg Hamstrings: very tight L, mild tight R ITB: NT Piriformis: a little tight BLE Hip flexors: not tight Quads: NT Heelcord: NT  LOWER EXTREMITY ROM:     Active  Right eval Left eval  Hip flexion    Hip extension    Hip abduction    Hip adduction    Hip internal rotation    Hip external rotation    Knee flexion    Knee extension    Ankle dorsiflexion    Ankle plantarflexion    Ankle inversion    Ankle  eversion    (Blank rows = not tested)  LOWER EXTREMITY MMT:    MMT Right eval Left eval  Hip flexion 4+ 4  Hip extension 3+ 4-  Hip abduction 4- 4  Hip adduction    Hip internal rotation 4 4-  Hip external rotation 4+ 4  Knee flexion 4 4-  Knee extension 5 4+  Ankle dorsiflexion 5 5  Ankle plantarflexion 5 5  Ankle inversion    Ankle eversion     (Blank rows = not tested)  LUMBAR SPECIAL TESTS:  Straight leg raise test: Negative, Slump test: Negative, and Long sit test: Negative   Longer L malleolus but no  change with long sit test;  Debby test is good flexibility, but increases his back pain for BLE (denies any SI pain)  CERVICAL SPECIAL TESTS: 01/04/24 Upper limb tension test (ULTT): Negative negative for median, radial, and ulnar nerves and unprovoked with cervical sidebending or rotation or both   FUNCTIONAL TESTS:  TBD if needed  GAIT: Distance walked: into clinic x 200' Assistive device utilized: None Level of assistance: Complete Independence Gait pattern: significant toe out BLE nearly contacting his other heel on swing phase of gait Comments: no limping noted   TODAY'S TREATMENT:  02/01/24 THERAPEUTIC EXERCISE: To improve strength and endurance.  Demonstration, verbal and tactile cues throughout for technique. Elliptical L2 x 2' F; L0 x 2' B  THERAPEUTIC ACTIVITIES: To improve functional performance.  Demonstration, verbal and tactile cues throughout for technique. Supine SLR ham stretch x 1' x 3 BLE Supine curl up and reach x 20 straight;  x 10 diagonals Prone full planks x 3 sec holds x 10  SL hip abd w/ PPT x 2/10 LLE Trunk Sidebending stretch sliding arm up wall and lean away x 1' x 2 each direction Seated hamstring stretch x 1' x 2  NEUROMUSCULAR RE-EDUCATION: To improve coordination, kinesthesia, posture, and proprioception. Tall kneel on foam w/ doubled blue TB pallof press x 15 each direction Trunk rotation and press x 15 each direction  Chop  down stabilization x 15 each direction BUE  01/17/24 NEUROMUSCULAR RE-EDUCATION: To improve coordination, kinesthesia, posture, and proprioception. Foam roll lying:  Shoulder ER w/ cerv retract w/ RTB x 20 BUE  Shoulder HABD w/ cerv retract RTB x 20 BUE  Snow angels w/ cerv retract x 20 BUE  Scapular depression w/ cerv retraction x 20 BUE  D2 PNF extension YTB w/ cerv retraction x 20 BUE  90/90 shoulder ER 1# x 20 BUE  Horizontal foam roll upper thoracic mobilizations x 2/10 hands behind head   MANUAL THERAPY: To promote improved joint mobility and reduced pain utilizing joint mobilization and myofascial release. Grade 3-4 jt mobilizations to C3-7 for P/A glides and rotations R x 20 reps for C6-7;    suboccipital release  01/13/24 THERAPEUTIC EXERCISE: To improve strength and endurance.  Demonstration, verbal and tactile cues throughout for technique. Elliptical L0 x 6' F  THERAPEUTIC ACTIVITIES: To improve functional performance.  Demonstration, verbal and tactile cues throughout for technique. Seated hamstring stretch x 1'  Seated FABER piriformis x 1' BLE SL hip abd x 20 BLE Prone plank x 10 SL plank from knees x 10 B Ab curl up/reach x 2/10  NEUROMUSCULAR RE-EDUCATION: To improve coordination, kinesthesia, posture, and proprioception. Seated 75 swiss ball Palloff press while holding knee extended blue TB x 2/10 BLE each direction Supine bridge w/ palloff press x 2/10 BUE  MANUAL THERAPY: To promote reduced pain utilizing percussion massage with massage gun. R lumbar paraspinals and quadratus lumborum and posterolateral R hip percussion massage for pain relief  01/11/24 THERAPEUTIC EXERCISE: To improve strength and endurance.  Demonstration, verbal and tactile cues throughout for technique. Elliptical L0 x 5' F  THERAPEUTIC ACTIVITIES: To improve functional performance.  Demonstration, verbal and tactile cues throughout for technique. Supine foam roll:  Cervical and  thoracic retraction x 20  Scapular depression x 20  Shoulder ER RTB w/ cerv retraction x 20  Shoulder HABD RTB w/ cerv retract x 20  90/90 shoulder ER x 20 BUE  Hands behind head isometric HABD x 20  Towel cervical extension glides x 8  C3-7 Seated towel rotational SNAGs x 10 to L;  x 10 to R at 3 different towel positions on the neck to encompass all vertebrae Pool noodle under neck cervical rotation x 20 each direction for MFR effects  01/06/24 THERAPEUTIC EXERCISE: To improve strength and endurance.  Demonstration, verbal and tactile cues throughout for technique. Elliptical L0 x 6' F  THERAPEUTIC ACTIVITIES: To improve functional performance.  Demonstration, verbal and tactile cues throughout for technique. Seated hamstring stretch x 1'  Seated FABER piriformis x 1' BLE Seated cross over piriformis x 1' BLE SL hip abd x 30 BLE Prone plank x 10 SL plank from knees x 10 B  NEUROMUSCULAR RE-EDUCATION: To improve coordination, kinesthesia, posture, and proprioception. Seated 75 swiss ball Palloff press while holding knee extended blue TB x 2/10 BLE each direction Quadruped fire hydrant w/ TA and lateral YTB pull from PT x 2/10 BLE each direction  MANUAL THERAPY: To promote reduced pain utilizing percussion massage with massage gun. R lumbar paraspinals and quadratus lumborum  percussion massage for pain relief  01/04/24 PT reevaluation/evaluation for cervical spine pain (see notations above and below for subjective notes, objective measures, assessment and goals for the cervical spine)  SELF CARE: Provided education on PT POC progression and initial HEP for cervical spine (see below for detail).   12/28/23 SELF CARE: Provided education on PT POC progression.   PATIENT EDUCATION:  Education details: HEP review and HEP update  Person educated: Patient Education method: Explanation, Demonstration, Verbal cues, Tactile cues, Handouts, and MedBridgeGO app access provided Education  comprehension: verbalized understanding, verbal cues required, tactile cues required, and needs further education  HOME EXERCISE PROGRAM: Access Code: [email or web address] URL: [email or web address] Date: 01/17/2024 Prepared by: Garnette Montclair  Exercises - Prone Hip Extension  - 1 x daily - 7 x weekly - 3 sets - 10 reps - Sidelying Hip Abduction  - 1 x daily - 7 x weekly - 3 sets - 10 reps - Side Plank on Knees  - 1 x daily - 7 x weekly - 1 sets - 10 reps - Plank on Knees  - 1 x daily - 7 x weekly - 1 sets - 10 reps - Standard Plank  - 1 x daily - 7 x weekly - 1 sets - 10 reps - Quadruped Fire Hydrant  - 1 x daily - 7 x weekly - 3 sets - 10 reps - Seated Hamstring Stretch  - 1 x daily - 7 x weekly - 1 sets - 2 reps - 1 min hold - Seated Anti-Rotation Press With Anchored Resistance  - 1 x daily - 7 x weekly - 3 sets - 10 reps - Seated Cervical Sidebending Stretch  - 1 x daily - 7 x weekly - 1 sets - 2 reps - 1 min hold - Mid-Lower Cervical Extension SNAG with Strap  - 1 x daily - 7 x weekly - 1 sets - 10 reps - Supine Cervical Retraction with Towel  - 1 x daily - 7 x weekly - 1 sets - 10 reps - 3 sec hold - Supine Scapular Retraction  - 1 x daily - 7 x weekly - 1 sets - 10 reps - 3 sec hold - Standing Scapular Depression  - 1 x daily - 7 x weekly - 3 sets - 10 reps - Supine Chest Stretch with Elbows Bent  - 1 x daily - 7 x weekly - 1 sets - 10 reps - 3 sec hold - Supine Shoulder  External Rotation on Foam Roll with Theraband  - 1 x daily - 7 x weekly - 3 sets - 10 reps - Hooklying Shoulder T  - 1 x daily - 7 x weekly - 3 sets - 10 reps - Supine Shoulder External and Internal Rotation in Abduction with Dumbbell  - 1 x daily - 7 x weekly - 3 sets - 10 reps - Seated Assisted Cervical Rotation with Towel  - 1 x daily - 7 x weekly - 1 sets - 10 reps - Supine PNF D2 Flexion with Resistance  - 1 x daily - 7 x weekly - 3 sets - 10 reps - Snow Angels on Foam Roll  - 1 x daily - 7 x weekly - 3  sets - 10 reps ASSESSMENT:  CLINICAL IMPRESSION: Patient is somewhat fatigued in general today and requiring more rest breaks.  He is able to complete all therex with fair tolerance.  Worked on maintaining more neutral spine positioning with his exercises which is somewhat difficult for him.  Will address cervical spine next visit.  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  The flaring up and improvement cycle is also somewhat consistent with OA pain.   He exhibits considerable cervical spine stiffness with looking up and likely has some foraminal crowding.  He has no recent imaging for the neck.   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  No crepitus is felt with cervical ROM.   He has decreased posterior/anterior vertebral glide mobility from C4-7.  There are no palpable trigger points and nerve tension tests are negative for the LUE.  He has no radicular symptoms in the LUE.   He demonstrates forward head posture with sloped shoulders and upper thoracic kyphosis.   Lindsay would benefit from PT to address cervical ROM/stiffness, posture, and pain deficits.    He is agreeable to the POC  BACK EVAL:  Toma Arts is a 68 y.o. male who was referred to physical therapy for evaluation and treatment for acute on chronic LBP   Patient reports onset of  pain beginning 2 weeks ago for no apparent reason.   However, he saw PCP and started celebrex  and is feeling better.    Pain is worse with sit to stand transfers and bending over/lifting and sitting or standing prolonged periods of time.  He spends a lot of time in the car and at his desk.  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  He has a slightly longer LLE and a little leftward leaning sway back posture.  Patient has deficits in lumbar ROM, hamstring LE flexibility, BLE strength, abnormal posture with some  leftward lean, and residual pain which are interfering with ADLs and are impacting quality of life.  On Modified Oswestry patient scored 19/50 demonstrating 38% or moderate disability.  Ladarian will benefit from skilled PT to address above deficits to improve mobility and activity tolerance with decreased pain interference.   OF note, he is also having neck pain and is going to speak with PCP about a referral to address the cervical spine as well.  We will look for this order  OBJECTIVE IMPAIRMENTS: difficulty walking, decreased ROM, decreased strength, postural dysfunction, and pain.   ACTIVITY LIMITATIONS: lifting, bending, sitting, and standing  PARTICIPATION LIMITATIONS: cleaning, laundry, driving,  and community activity  PERSONAL FACTORS: Age and 1-2 comorbidities: DM-II, obesity, elevated BP, GERD, B patellofemoral pain syndrome, B hip pain, h/o R RTC tendinitis, chronic B LBP w/o sciatica, scoliosis, diabetic peripheral neuropathy are also affecting patient's functional outcome.   REHAB POTENTIAL: Good  CLINICAL DECISION MAKING: Evolving/moderate complexity  EVALUATION COMPLEXITY: Moderate   GOALS: Goals reviewed with patient? Yes  SHORT TERM GOALS: Target date: 01/25/2024   Patient will be independent with initial HEP to improve outcomes and carryover.  Baseline: 100% PT assist required for correct completion 01/04/24:  initial HEP for cervical pain initiated today 11/12:  progressing with initial HEP for cervical spine today Goal status: MET   2.  Patient will report 25% improvement in low back pain to improve QOL. Baseline: 5/10 Goal status: INITIAL  3.  Patient will report 50% subjective improvement in L sided cervical pain   Baseline:  11/12:  2/10  Goal status:  INITIAL on 01/04/24  LONG TERM GOALS: Target date: 02/22/2024   Patient will be independent with ongoing/advanced HEP for self-management at home.  Baseline: no advanced HEP yet 01/11/24:  advancing Goal  status: IN PROGRESS  2.  Patient will report 50-75% improvement in low back pain to improve QOL.  Baseline: 5/10 02/01/24:  2/10 Goal status: INITIAL  3.  Patient to demonstrate ability to achieve and maintain good spinal alignment/posturing and body mechanics needed for daily activities. Baseline: leftward lean with sway back Goal status: IN PROGRESS  4.  Patient will demonstrate full pain free lumbar ROM to perform ADLs.   Baseline: Refer to above lumbar ROM table Goal status: INITIAL  5.  Patient will demonstrate improved BLE strength to >/= 5/5 for improved stability and ease of mobility. Baseline: Refer to above LE MMT table Goal status: INITIAL  6. Patient will report </= 20% on Modified Oswestry (MCID = 12%) to demonstrate improved functional ability with decreased pain interference. Baseline: 38% 02/01/24:  26% Goal status: IN PROGRESS  7.  Patient will tolerate 30 min of (standing/sitting/walking) w/o increased pain to allow for  improved mobility and activity tolerance. Baseline: states pain begins after 10 min standing/walking Goal status: INITIAL   8.  Patient will demonstrate 60-80% cervical extension to be able to look up to do work around the house     Baseline:  see ROM tables above   Goal status:  INITIAL       9.  Patient will report >/= 75% improvement in neck pain    01/11/24:  2/10 Goal status:  INITIAL  10.  New Goal 02/01/24:  NDI score will improve to >/= 20% to reflect improved activity tolerance and QOL  Baseline:28%   Goal status:  INITIAL  PLAN:  PT FREQUENCY: 1-2x/week  PT DURATION: 8 weeks  PLANNED INTERVENTIONS: 97164- PT Re-evaluation, 97110-Therapeutic exercises, 97530- Therapeutic activity, 97112- Neuromuscular re-education, 97535- Self Care, 02859- Manual therapy, G0283- Electrical stimulation (unattended), [id number]- Ultrasound, 02987- Traction (mechanical), [id number]- Ionotophoresis 4mg /ml Dexamethasone , 79439 (1-2 muscles), 20561 (3+ muscles)-  Dry Needling, Patient/Family education, Balance training, Taping, Joint mobilization, Spinal mobilization, Cryotherapy, and Moist heat  PLAN FOR NEXT SESSION:  Address cervical pain next visit.  Check cervical and Lumbar ROM and LE strength   CERVICAL treatment focusing on flexibility of cervial and thoracic vertebral extension and postural strengthening.   Lumbar treatment focusing on core and lumbar stabilization w/ swiss ball, tall kneel positions and hip abductors and extensors with PPT positions to prevent excessive lordosis posturing.  Missael Ferrari, PT 02/01/2024, 8:40 PM

## 2024-02-02 NOTE — Telephone Encounter (Signed)
 Can you please advise pt who will be doing sleep study?

## 2024-02-03 ENCOUNTER — Encounter: Payer: Self-pay | Admitting: Podiatry

## 2024-02-03 ENCOUNTER — Ambulatory Visit: Admitting: Podiatry

## 2024-02-03 DIAGNOSIS — E1142 Type 2 diabetes mellitus with diabetic polyneuropathy: Secondary | ICD-10-CM | POA: Diagnosis not present

## 2024-02-03 DIAGNOSIS — M79675 Pain in left toe(s): Secondary | ICD-10-CM | POA: Diagnosis not present

## 2024-02-03 DIAGNOSIS — M79674 Pain in right toe(s): Secondary | ICD-10-CM

## 2024-02-03 DIAGNOSIS — B351 Tinea unguium: Secondary | ICD-10-CM

## 2024-02-03 NOTE — Progress Notes (Signed)
  Subjective:  Patient ID: Noah Lane, male    DOB: 02/22/1956,   MRN: [id number]  Chief Complaint  Patient presents with   Diabetes    I'm here for the regular Diabetic foot check.  Saw Dr. Frann -     68 y.o. male presents for concern of thickened elongated and painful nails that are difficult to trim. Requesting to have them trimmed today. Relates burning and tingling in their feet. Patient is diabetic and last A1c was  Lab Results  Component Value Date   HGBA1C 7.1 (H) 08/19/2023   .   PCP:  Frann Mabel Mt, DO      . Denies any other pedal complaints. Denies n/v/f/c.   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 CFT <3 seconds. Absent hair growth on digits. Edema noted to bilateral lower extremities. Xerosis noted bilaterally.  Skin. No lacerations or abrasions bilateral feet. Nails 1-5 bilateral  are thickened discolored and elongated with subungual debris.  Musculoskeletal: MMT 5/5 bilateral lower extremities in DF, PF, Inversion and Eversion. Deceased ROM in DF of ankle joint. No pain to palpation about the toe.  Neurological: Sensation intact to light touch. Protective sensation diminished bilateral.    Assessment:   1. Pain due to onychomycosis of toenails of both feet   2. Type 2 diabetes mellitus with peripheral neuropathy (HCC)          Plan:  Patient was evaluated and treated and all questions answered. -Discussed and educated patient on diabetic foot care, especially with  regards to the vascular, neurological and musculoskeletal systems.  -Stressed the importance of good glycemic control and the detriment of not  controlling glucose levels in relation to the foot. -Discussed supportive shoes at all times and checking feet regularly.  -Mechanically debrided all nails 1-5 bilateral using sterile nail  nipper and filed with dremel without incident  -Continue penlac  -Answered all patient questions -Patient to return  in 3 months for at risk foot care -Patient advised to call the office if any problems or questions arise in the meantime.     Asberry Failing, DPM

## 2024-02-06 ENCOUNTER — Other Ambulatory Visit (HOSPITAL_BASED_OUTPATIENT_CLINIC_OR_DEPARTMENT_OTHER): Payer: Self-pay

## 2024-02-06 DIAGNOSIS — G4733 Obstructive sleep apnea (adult) (pediatric): Secondary | ICD-10-CM

## 2024-02-06 NOTE — Progress Notes (Signed)
 New home sleep test order placed today.

## 2024-02-07 ENCOUNTER — Ambulatory Visit: Admitting: Rehabilitation

## 2024-02-07 ENCOUNTER — Encounter: Payer: Self-pay | Admitting: Rehabilitation

## 2024-02-07 DIAGNOSIS — M6281 Muscle weakness (generalized): Secondary | ICD-10-CM | POA: Diagnosis not present

## 2024-02-07 DIAGNOSIS — M5459 Other low back pain: Secondary | ICD-10-CM

## 2024-02-07 DIAGNOSIS — M5412 Radiculopathy, cervical region: Secondary | ICD-10-CM

## 2024-02-07 NOTE — Therapy (Signed)
 OUTPATIENT PHYSICAL THERAPY LUMBAR TREATMENT   Patient Name: Noah Lane MRN: [id number] DOB:February 16, 1956, 68 y.o., male Today's Date: 02/07/2024  END OF SESSION:  PT End of Session - 02/07/24 0802     Visit Number 8    Date for Recertification  02/22/24    PT Start Time 0800    PT Stop Time 0847    PT Time Calculation (min) 47 min    Activity Tolerance Patient tolerated treatment well;No increased pain    Behavior During Therapy WFL for tasks assessed/performed            Past Medical History:  Diagnosis Date   Broken toe    Diabetes mellitus type 2 in obese    Mixed hyperlipidemia    Past Surgical History:  Procedure Laterality Date   COLON RESECTION N/A 04/27/2022   Procedure: LAPAROSCOPIC HAND ASSISTED RIGHT HEMI COLECTOMY, POSSIBLE OPEN;  Surgeon: Dasie Leonor CROME, MD;  Location: [facility];  Service: General;  Laterality: N/A;   COLONOSCOPY     HERNIA REPAIR  2012   Patient Active Problem List   Diagnosis Date Noted   Elevated blood pressure reading 05/04/2022   Colon adenocarcinoma (HCC) 05/04/2022   Gastroesophageal reflux disease 04/29/2022   Ileus, postoperative (HCC) 04/29/2022   Hypokalemia 04/28/2022   Hyperlipidemia 04/28/2022   Colonic obstruction (HCC) 04/25/2022   Hyponatremia 04/25/2022   OSA on CPAP 12/03/2021   Obesity (BMI 30.0-34.9) 12/03/2021   Mixed hyperlipidemia 10/20/2021   Strain of lumbar region 10/16/2021   Strain of calf muscle 07/22/2021   Patellofemoral pain syndrome of both knees 02/24/2021   Rotator cuff tendinitis, right 12/28/2019   Hip pain, bilateral 05/28/2019   Chronic bilateral low back pain without sciatica 05/28/2019   Type 2 diabetes mellitus with obesity 05/28/2019   Erectile dysfunction 05/28/2019   Pain and swelling of left knee 03/12/2019   Lateral epicondylitis, right elbow 03/15/2017   Diverticulosis of large intestine without hemorrhage 02/25/2014    PCP: Frann Mabel Mt, DO   REFERRING PROVIDER:  Dorina Loving, PA-C   REFERRING DIAG: M54.50 (ICD-10-CM) - Acute midline low back pain without sciatica  THERAPY DIAG:  Muscle weakness (generalized)  Other low back pain  Radiculopathy, cervical region  RATIONALE FOR EVALUATION AND TREATMENT: Rehabilitation  ONSET DATE: 2 weeks ago  NEXT MD VISIT:    SUBJECTIVE:  SUBJECTIVE STATEMENT: Patient reports feels better with his neck pain and would prefer to address L sided LBP today.   Rates pain in the L low back 4/10  01/04/24 EVAL CERVICAL PAIN:  Patient is reevaluated today for cervical pain.  He has a new order from his PCP for neck pain evaluate/treat.   He reports the pain started several weeks ago for no apparent reason.  He states the pain was on the L side of his cervical spine and top of the L shoulder/upper trap area.   He isn't sure what precipitated it.   Denies any trauma recently.  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   He has had off/on neck pain since that time, but usually gets better.   He states he is feeling better with the neck pain this week than he did last week.   Pain level is only minimal now.   He is still working as a ecologist and admits that he has a fair amount of stress which he feels may contribute to his neck pain.   He does a lot of sitting and driving with his job.   He denies any pain, numbness, tingling into the L arm.    EVAL LBP: 68 y/o patient referred to PT for LBP.   He is well known to us  from recent episodes of PT for this issue with the latest being about 2 months ago.  Patient states 2 weeks ago he started having severe R sided LBP with any movement.   He reports the pain was severe initially, but is now doing better with taking  Celebrex .  States he can't recall any injury that would have precipitated this.  He states that he is currently having a lot of soreness now, but it is not constant.  States feels it more with sit to stand transfers.  Played golf 3-4 weeks ago, but doesn't think this aggravated him.  Doing piriformis stretching and lying flat helps him some.   He has had a back injection many years ago, but nothing since then.  No MRI.  Old xray shows DDD, facet arthropathy.    He does have a long history of off/on back pain with difficulty playing golf at times, which he likes to do.   However he states he is not going to try to play anymore this year, and is going to try to focus on exercises to improve his back pain and prevent reinjury  PAIN: Are you having pain? Yes: NPRS scale: 5/10 now and worst over last week.    Pain location: central low back now Pain description: soreness most of the time; sharp at times Aggravating factors: lifting,  Relieving factors: lying flat  PERTINENT HISTORY:  DM-II, obesity, elevated BP, GERD, B patellofemoral pain syndrome, B hip pain, h/o R RTC tendinitis, chronic B LBP w/o sciatica, scoliosis, diabetic peripheral neuropathy   PRECAUTIONS: None  RED FLAGS: None  WEIGHT BEARING RESTRICTIONS: No  FALLS:  Has patient fallen in last 6 months? No  LIVING ENVIRONMENT: Lives with: lives with their family and lives with their spouse Lives in: House/apartment Stairs: Yes: Internal: 14 steps; on left going up and External: 1 steps; unknown Has following equipment at home: Single point cane and Walker - 2 wheeled  OCCUPATION: pharmacist, hospital, mostly seated desk work and driving  PLOF: Independent  PATIENT GOALS: improve back pain and prevent  further flareups/reinjuries   OBJECTIVE: (objective measures completed at initial evaluation unless otherwise dated)  DIAGNOSTIC FINDINGS:  EXAM: LUMBAR SPINE - 2-3 VIEW   COMPARISON:  None Available.    FINDINGS: Evaluation is limited due to body habitus.   Five lumbar type vertebra. There is no acute fracture or subluxation of the lumbar spine. There is multilevel degenerative changes with disc space narrowing, endplate irregularity and spurring/osteophyte. Multilevel facet arthropathy. The visualized posterior elements appear intact. There is atherosclerotic calcification of the aorta. The soft tissues are grossly unremarkable.   IMPRESSION: No acute fracture or subluxation of the lumbar spine.     Electronically Signed   By: Vanetta Chou M.D.   On: 10/18/2021 20:41  PATIENT SURVEYS:  Modified Oswestry:  MODIFIED OSWESTRY DISABILITY SCALE 02/01/24  Date: 02/07/2024 Score   Pain intensity 2 =  Pain medication provides me with complete relief from pain. 2  2. Personal care (washing, dressing, etc.) 1 =  I can take care of myself normally, but it increases my pain. 0  3. Lifting 4 = I can lift only very light weights 3  4. Walking 1 = Pain prevents me from walking more than 1 mile. 1  5. Sitting 2 =  Pain prevents me from sitting more than 1 hour. 2  6. Standing 4 =  Pain prevents me from standing more than 10 minutes. 2  7. Sleeping 1 = I can sleep well only by using pain medication. 1  8. Social Life 1 =  My social life is normal, but it increases my level of pain. 0  9. Traveling 1 =  I can travel anywhere, but it increases my pain. 1  10. Employment/ Homemaking 2 = I can perform most of my homemaking/job duties, but pain prevents me from performing more physically stressful activities (eg, lifting, vacuuming). 1  Total 19/50 13/50 = 26%   Interpretation of scores: Score Category Description  0-20% Minimal Disability The patient can cope with most living activities. Usually no treatment is indicated apart from advice on lifting, sitting and exercise  21-40% Moderate Disability The patient experiences more pain and difficulty with sitting, lifting and standing. Travel and  social life are more difficult and they may be disabled from work. Personal care, sexual activity and sleeping are not grossly affected, and the patient can usually be managed by conservative means  41-60% Severe Disability Pain remains the main problem in this group, but activities of daily living are affected. These patients require a detailed investigation  61-80% Crippled Back pain impinges on all aspects of the patient's life. Positive intervention is required  81-100% Bed-bound These patients are either bed-bound or exaggerating their symptoms  Bluford FORBES Zoe DELENA Karon DELENA, et al. Surgery versus conservative management of stable thoracolumbar fracture: the PRESTO feasibility RCT. [place] ([place]): [employer]; 2021 Nov. [facility] Technology Assessment, No. 25.62.) Appendix 3, Oswestry Disability Index category descriptors. Available from: Findjewelers.cz  Minimally Clinically Important Difference (MCID) = 12.8%   NDI:  NECK DISABILITY INDEX 02/01/24  Date: 02/07/2024 Score   Pain intensity 3 = The pain is fairly severe at the moment 1  2. Personal care (washing, dressing, etc.) 1 =  I can look after myself normally but it causes extra pain 0  3. Lifting 3 = Pain prevents me from lifting heavy weights but I can manage light to medium   weights if they are conveniently positioned 3  4. Reading 2 =  I can read as  much as I want with moderate pain in my neck 0  5. Headaches 2 =  I have moderate headaches, which come infrequently 2  6. Concentration 1 =  I can concentrate fully when I want to with slight difficulty  1  7. Work 1 =  I can only do my usual work, but no more 1  8. Driving 1 =  I can drive my car as long as I want with slight pain in my neck 1  9. Sleeping 1 = My sleep is slightly disturbed (less than 1 hr sleepless) 2  10. Recreation 3 = I am able to engage in a few of my usual recreation activities because of pain in   my neck 3  Total 18/50  = 36% 14/50 = 28   Minimum Detectable Change (90% confidence): 5 points or 10% points   SCREENING FOR RED FLAGS: Bowel or bladder incontinence: No Spinal tumors: No Cauda equina syndrome: No Compression fracture: No Abdominal aneurysm: No  COGNITION:  Overall cognitive status: Within functional limits for tasks assessed    SENSATION: WFL  POSTURE:  Has some Leftward scoliosis present;   increased lumbar extension/sway position   Supine:  Equal pelvic landmarks;  longer LLE at the malleolus by a small amount   PALPATION: Not specifically painful;   deep palpation of lumbar paraspinals and QL are nonpainful Lumbar posterior/anterior glides and rotational glides are non painful No palpation reproduces his pain  01/04/24:  neck eval:  TTP over the C4-7 transverse processes;  no palpable trigger points throughout L neck;  P/A vertebral glides are some restricted C4-7.  Side glides and rotational glides feel fairly normal  LUMBAR ROM:   Active  Eval 02/07/24  Flexion To knees; some end range pain To knees, tightness, 50 deg on inclinometer  Extension 100% 100%; 40 deg on inclinometer  Right lateral flexion To upper thigh;  stiff feeling Upper thigh  Left lateral flexion To upper thigh; stiff feeling Less than R on upper thigh  Right rotation 75%; stiff 85%  Left rotation 100% 100%; a little more difficult per patient  (Blank rows = not tested)  CERVICAL ROM: 01/04/24  Active ROM A/PROM (deg) eval  Flexion 90%  Extension 45%  Right lateral flexion 50%  Left lateral flexion 50%  Right rotation 80%  Left rotation 75%    No motion is especially painful   MUSCLE LENGTH: Hamstrings: Right SLR = 80 deg; Left SLR = 50 deg Hamstrings: very tight L, mild tight R ITB: NT Piriformis: a little tight BLE Hip flexors: not tight Quads: NT Heelcord: NT  LOWER EXTREMITY ROM:     Active  Right eval Left eval  Hip flexion    Hip extension    Hip abduction    Hip adduction     Hip internal rotation    Hip external rotation    Knee flexion    Knee extension    Ankle dorsiflexion    Ankle plantarflexion    Ankle inversion    Ankle eversion    (Blank rows = not tested)  LOWER EXTREMITY MMT:    MMT Right eval Left eval 02/07/24 R/L  Hip flexion 4+ 4 4+/4+  Hip extension 3+ 4- 4-/4-  Hip abduction 4- 4 4/4  Hip adduction   NT/4-  Hip internal rotation 4 4- 4+/4  Hip external rotation 4+ 4 4+/4  Knee flexion 4 4- 4+/4+  Knee extension 5 4+ 5/5  Ankle dorsiflexion 5  5 5/5  Ankle plantarflexion 5 5   Ankle inversion     Ankle eversion      (Blank rows = not tested)  LUMBAR SPECIAL TESTS:  Straight leg raise test: Negative, Slump test: Negative, and Long sit test: Negative   Longer L malleolus but no change with long sit test;  Debby test is good flexibility, but increases his back pain for BLE (denies any SI pain)  CERVICAL SPECIAL TESTS: 01/04/24 Upper limb tension test (ULTT): Negative negative for median, radial, and ulnar nerves and unprovoked with cervical sidebending or rotation or both   FUNCTIONAL TESTS:  TBD if needed  GAIT: Distance walked: into clinic x 200' Assistive device utilized: None Level of assistance: Complete Independence Gait pattern: significant toe out BLE nearly contacting his other heel on swing phase of gait Comments: no limping noted   TODAY'S TREATMENT:  02/07/24 THERAPEUTIC EXERCISE: To improve strength and endurance.  Demonstration, verbal and tactile cues throughout for technique. Elliptical L0 4' F, 4' B  THERAPEUTIC ACTIVITIES: To improve functional performance.  Demonstration, verbal and tactile cues throughout for technique. Seated hamstring stretch x 1' x 2 BLE Seated hamstring stretch w/ leg propped on bed x 1' x 2 BLE Running man exercise with BUE support (no weight) x 2/8 Seated overhead reach lumbar sidebending stretch x 1' x 2 each way Rechecked strength Rechecked lumbar ROM  NEUROMUSCULAR  RE-EDUCATION: To improve reeducation of movement and isolating gluteus maximum w/ exercises rather than quads.  Sidestepping between 8 stools w/ 15# KB lift x 8 laps Deep forward lunge w/ contralateral toe slides to neutral F/B to isolate Gmax x 2/8--manual facilitation required for form Deep back squats x 8 BLE--vc required for hip hinge and form   02/01/24 THERAPEUTIC EXERCISE: To improve strength and endurance.  Demonstration, verbal and tactile cues throughout for technique. Elliptical L2 x 2' F; L0 x 2' B  THERAPEUTIC ACTIVITIES: To improve functional performance.  Demonstration, verbal and tactile cues throughout for technique. Supine SLR ham stretch x 1' x 3 BLE Supine curl up and reach x 20 straight;  x 10 diagonals Prone full planks x 3 sec holds x 10  SL hip abd w/ PPT x 2/10 LLE Trunk Sidebending stretch sliding arm up wall and lean away x 1' x 2 each direction Seated hamstring stretch x 1' x 2  NEUROMUSCULAR RE-EDUCATION: To improve coordination, kinesthesia, posture, and proprioception. Tall kneel on foam w/ doubled blue TB pallof press x 15 each direction Trunk rotation and press x 15 each direction  Chop down stabilization x 15 each direction BUE  01/17/24 NEUROMUSCULAR RE-EDUCATION: To improve coordination, kinesthesia, posture, and proprioception. Foam roll lying:  Shoulder ER w/ cerv retract w/ RTB x 20 BUE  Shoulder HABD w/ cerv retract RTB x 20 BUE  Snow angels w/ cerv retract x 20 BUE  Scapular depression w/ cerv retraction x 20 BUE  D2 PNF extension YTB w/ cerv retraction x 20 BUE  90/90 shoulder ER 1# x 20 BUE  Horizontal foam roll upper thoracic mobilizations x 2/10 hands behind head   MANUAL THERAPY: To promote improved joint mobility and reduced pain utilizing joint mobilization and myofascial release. Grade 3-4 jt mobilizations to C3-7 for P/A glides and rotations R x 20 reps for C6-7;    suboccipital release  01/13/24 THERAPEUTIC EXERCISE: To  improve strength and endurance.  Demonstration, verbal and tactile cues throughout for technique. Elliptical L0 x 6' F  THERAPEUTIC ACTIVITIES: To improve functional  performance.  Demonstration, verbal and tactile cues throughout for technique. Seated hamstring stretch x 1'  Seated FABER piriformis x 1' BLE SL hip abd x 20 BLE Prone plank x 10 SL plank from knees x 10 B Ab curl up/reach x 2/10  NEUROMUSCULAR RE-EDUCATION: To improve coordination, kinesthesia, posture, and proprioception. Seated 75 swiss ball Palloff press while holding knee extended blue TB x 2/10 BLE each direction Supine bridge w/ palloff press x 2/10 BUE  MANUAL THERAPY: To promote reduced pain utilizing percussion massage with massage gun. R lumbar paraspinals and quadratus lumborum and posterolateral R hip percussion massage for pain relief  01/11/24 THERAPEUTIC EXERCISE: To improve strength and endurance.  Demonstration, verbal and tactile cues throughout for technique. Elliptical L0 x 5' F  THERAPEUTIC ACTIVITIES: To improve functional performance.  Demonstration, verbal and tactile cues throughout for technique. Supine foam roll:  Cervical and thoracic retraction x 20  Scapular depression x 20  Shoulder ER RTB w/ cerv retraction x 20  Shoulder HABD RTB w/ cerv retract x 20  90/90 shoulder ER x 20 BUE  Hands behind head isometric HABD x 20  Towel cervical extension glides x 8 C3-7 Seated towel rotational SNAGs x 10 to L;  x 10 to R at 3 different towel positions on the neck to encompass all vertebrae Pool noodle under neck cervical rotation x 20 each direction for MFR effects  01/06/24 THERAPEUTIC EXERCISE: To improve strength and endurance.  Demonstration, verbal and tactile cues throughout for technique. Elliptical L0 x 6' F  THERAPEUTIC ACTIVITIES: To improve functional performance.  Demonstration, verbal and tactile cues throughout for technique. Seated hamstring stretch x 1'  Seated FABER  piriformis x 1' BLE Seated cross over piriformis x 1' BLE SL hip abd x 30 BLE Prone plank x 10 SL plank from knees x 10 B  NEUROMUSCULAR RE-EDUCATION: To improve coordination, kinesthesia, posture, and proprioception. Seated 75 swiss ball Palloff press while holding knee extended blue TB x 2/10 BLE each direction Quadruped fire hydrant w/ TA and lateral YTB pull from PT x 2/10 BLE each direction  MANUAL THERAPY: To promote reduced pain utilizing percussion massage with massage gun. R lumbar paraspinals and quadratus lumborum  percussion massage for pain relief  01/04/24 PT reevaluation/evaluation for cervical spine pain (see notations above and below for subjective notes, objective measures, assessment and goals for the cervical spine)  SELF CARE: Provided education on PT POC progression and initial HEP for cervical spine (see below for detail).   12/28/23 SELF CARE: Provided education on PT POC progression.   PATIENT EDUCATION:  Education details: adding running man and lunge w/ back leg toe slides forward for gluteus maximum strength  Person educated: Patient Education method: Explanation, Demonstration, Verbal cues, Tactile cues, Handouts, and [email or web address] app access provided Education comprehension: verbalized understanding, verbal cues required, tactile cues required, and needs further education  HOME EXERCISE PROGRAM: Access Code: [email or web address] URL: [email or web address] Date: 02/07/2024 Prepared by: Garnette Montclair  Exercises - Prone Hip Extension  - 1 x daily - 7 x weekly - 3 sets - 10 reps - Sidelying Hip Abduction  - 1 x daily - 7 x weekly - 3 sets - 10 reps - Side Plank on Knees  - 1 x daily - 7 x weekly - 1 sets - 10 reps - Plank on Knees  - 1 x daily - 7 x weekly - 1 sets - 10 reps - Standard Plank  - 1 x daily - 7 x weekly -  1 sets - 10 reps - Quadruped Academic Librarian  - 1 x daily - 7 x weekly - 3 sets - 10 reps - Seated Hamstring Stretch  - 1 x daily -  7 x weekly - 1 sets - 2 reps - 1 min hold - Seated Anti-Rotation Press With Anchored Resistance  - 1 x daily - 7 x weekly - 3 sets - 10 reps - Seated Cervical Sidebending Stretch  - 1 x daily - 7 x weekly - 1 sets - 2 reps - 1 min hold - Mid-Lower Cervical Extension SNAG with Strap  - 1 x daily - 7 x weekly - 1 sets - 10 reps - Supine Cervical Retraction with Towel  - 1 x daily - 7 x weekly - 1 sets - 10 reps - 3 sec hold - Supine Scapular Retraction  - 1 x daily - 7 x weekly - 1 sets - 10 reps - 3 sec hold - Standing Scapular Depression  - 1 x daily - 7 x weekly - 3 sets - 10 reps - Supine Chest Stretch with Elbows Bent  - 1 x daily - 7 x weekly - 1 sets - 10 reps - 3 sec hold - Supine Shoulder External Rotation on Foam Roll with Theraband  - 1 x daily - 7 x weekly - 3 sets - 10 reps - Hooklying Shoulder T  - 1 x daily - 7 x weekly - 3 sets - 10 reps - Supine Shoulder External and Internal Rotation in Abduction with Dumbbell  - 1 x daily - 7 x weekly - 3 sets - 10 reps - Seated Assisted Cervical Rotation with Towel  - 1 x daily - 7 x weekly - 1 sets - 10 reps - Supine PNF D2 Flexion with Resistance  - 1 x daily - 7 x weekly - 3 sets - 10 reps - Snow Angels on Foam Roll  - 1 x daily - 7 x weekly - 3 sets - 10 reps - Single Leg Lunge with Foot on Bench  - 1 x daily - 7 x weekly - 2 sets - 8 reps - Squat with Chair Touch and Resistance Loop  - 1 x daily - 7 x weekly - 2 sets - 8 reps - Seated Sidebending Arms Overhead  - 1 x daily - 7 x weekly - 1 sets - 2 reps - 1 min hold  ASSESSMENT:  CLINICAL IMPRESSION: Rechecked lumbar strength and ROM and not a whole lot of improvement in hip abduction and extension strength and lumbar ROM noted.   Focused today on weight bearing gluteus max/medius strengthening and patient definitely struggles with these exercises indicating that we need to work on this more.   Also added sidebending stretching and reemphasized the importance of holding his hamstring  stretches for longer to get a physiological change in the length of the muscle.  His neck seems to be doing better and he thinks it may be related to stress.   PT remains necessary for ROM, strength, pain deficits.  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  The flaring up and improvement cycle is also somewhat consistent with OA pain.   He exhibits considerable cervical spine stiffness with looking up and likely has some foraminal crowding.  He has no recent imaging for the neck.  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  No crepitus is felt with cervical ROM.   He has decreased posterior/anterior vertebral glide mobility from C4-7.  There are no palpable trigger points and nerve tension tests are negative for the LUE.  He has no radicular symptoms in the LUE.   He demonstrates forward head posture with sloped shoulders and upper thoracic kyphosis.   Thom would benefit from PT to address cervical ROM/stiffness, posture, and pain deficits.    He is agreeable to the POC  BACK EVAL:  Noah Lane is a 68 y.o. male who was referred to physical therapy for evaluation and treatment for acute on chronic LBP   Patient reports onset of  pain beginning 2 weeks ago for no apparent reason.   However, he saw PCP and started celebrex  and is feeling better.    Pain is worse with sit to stand transfers and bending over/lifting and sitting or standing prolonged periods of time.  He spends a lot of time in the car and at his desk.  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  He has a slightly longer LLE and a little leftward leaning sway back posture.  Patient has deficits in lumbar ROM, hamstring LE flexibility, BLE strength, abnormal posture with some leftward lean, and residual pain which are interfering with ADLs and are impacting quality of life.  On Modified Oswestry patient  scored 19/50 demonstrating 38% or moderate disability.  Armonte will benefit from skilled PT to address above deficits to improve mobility and activity tolerance with decreased pain interference.   OF note, he is also having neck pain and is going to speak with PCP about a referral to address the cervical spine as well.  We will look for this order  OBJECTIVE IMPAIRMENTS: difficulty walking, decreased ROM, decreased strength, postural dysfunction, and pain.   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   REHAB POTENTIAL: Good  CLINICAL DECISION MAKING: Evolving/moderate complexity  EVALUATION COMPLEXITY: Moderate   GOALS: Goals reviewed with patient? Yes  SHORT TERM GOALS: Target date: 01/25/2024   Patient will be independent with initial HEP to improve outcomes and carryover.  Baseline: 100% PT assist required for correct completion 01/04/24:  initial HEP for cervical pain initiated today 11/12:  progressing with initial HEP for cervical spine today Goal status: MET   2.  Patient will report 25% improvement in low back pain to improve QOL. Baseline: 5/10 Goal status: INITIAL  3.  Patient will report 50% subjective improvement in L sided cervical pain   Baseline:  11/12:  2/10  Goal status:  INITIAL on 01/04/24  LONG TERM GOALS: Target date: 02/22/2024   Patient will be independent with ongoing/advanced HEP for self-management at home.  Baseline: no advanced HEP yet 01/11/24:  advancing Goal status: IN PROGRESS  2.  Patient will report 50-75% improvement in low back pain to improve QOL.  Baseline: 5/10 02/01/24:   2/10 02/07/24:  4/10 today Goal status: IN PROGRESS  3.  Patient to demonstrate ability to achieve and maintain good spinal alignment/posturing and body mechanics needed for daily activities. Baseline: leftward lean with sway back Goal status: IN PROGRESS  4.  Patient  will demonstrate full pain free lumbar ROM to perform ADLs.   Baseline: Refer to above lumbar ROM table 02/07/24:  see updated tables Goal status: IN PROGRESS  5.  Patient will demonstrate improved BLE strength to >/= 5/5 for improved stability and ease of mobility. Baseline: Refer to above LE MMT table 02/07/24:  see updated tables Goal status: IN PROGRESS  6. Patient will report </= 20% on Modified Oswestry (MCID = 12%) to demonstrate improved functional ability with decreased pain interference. Baseline: 38% 02/01/24:  26% Goal status: IN PROGRESS  7.  Patient will tolerate 30 min of (standing/sitting/walking) w/o increased pain to allow for  improved mobility and activity tolerance. Baseline: states pain begins after 10 min standing/walking Goal status: INITIAL   8.  Patient will demonstrate 60-80% cervical extension to be able to look up to do work around the house     Baseline:  see ROM tables above   Goal status:  INITIAL       9.  Patient will report >/= 75% improvement in neck pain    01/11/24:  2/10 Goal status:  INITIAL  10.  New Goal 02/01/24:  NDI score will improve to >/= 20% to reflect improved activity tolerance and QOL  Baseline:28%   Goal status:  INITIAL  PLAN:  PT FREQUENCY: 1-2x/week  PT DURATION: 8 weeks  PLANNED INTERVENTIONS: 97164- PT Re-evaluation, 97110-Therapeutic exercises, 97530- Therapeutic activity, 97112- Neuromuscular re-education, 97535- Self Care, 02859- Manual therapy, G0283- Electrical stimulation (unattended), [id number]- Ultrasound, 02987- Traction (mechanical), [id number]- Ionotophoresis 4mg /ml Dexamethasone , 79439 (1-2 muscles), 20561 (3+ muscles)- Dry Needling, Patient/Family  education, Balance training, Taping, Joint mobilization, Spinal mobilization, Cryotherapy, and Moist heat  PLAN FOR NEXT SESSION:  Address either LBP or cervical pain next visit depending on patient preference.  Check cervical and Lumbar ROM and LE strength   CERVICAL treatment focusing on flexibility of cervial and thoracic vertebral extension and postural strengthening.   Lumbar treatment focusing on core and lumbar stabilization w/ swiss ball, tall kneel positions and hip abductors and extensors with PPT positions to prevent excessive lordosis posturing.    Alta Shober, PT 02/07/2024, 12:32 PM

## 2024-02-08 ENCOUNTER — Encounter (HOSPITAL_BASED_OUTPATIENT_CLINIC_OR_DEPARTMENT_OTHER)

## 2024-02-08 DIAGNOSIS — G4733 Obstructive sleep apnea (adult) (pediatric): Secondary | ICD-10-CM

## 2024-02-09 ENCOUNTER — Telehealth (HOSPITAL_BASED_OUTPATIENT_CLINIC_OR_DEPARTMENT_OTHER): Payer: Self-pay

## 2024-02-09 NOTE — Telephone Encounter (Signed)
 Dropped off HST box. Reports that indicator lights never turned on and that nothing cycled or ran. He is requesting to speak with Sierrah as quickly as possible. Please advise.

## 2024-02-09 NOTE — Telephone Encounter (Signed)
 Pt stated he had issues with the Alice device and wants to try to do another study. Pt said machine was not working so he did not sleep with it on. He wants to try a WatchPat to see if he can get a good study on that. Routing to [employer] to auth for [employer].

## 2024-02-09 NOTE — Telephone Encounter (Unsigned)
 Copied from CRM #[id number]. Topic: Clinical - Medical Advice >> Feb 09, 2024  9:12 AM Isabell A wrote: Reason for CRM: Patient is needing assistance with his home sleep study kit - wants to drop it back of to the office or speak to someone who can help him, states it failed to operate properly, lights not working and he tried to restart it as well.   Callback number: [phone]

## 2024-02-10 ENCOUNTER — Encounter: Payer: Self-pay | Admitting: Rehabilitation

## 2024-02-10 ENCOUNTER — Ambulatory Visit: Admitting: Rehabilitation

## 2024-02-10 DIAGNOSIS — M6281 Muscle weakness (generalized): Secondary | ICD-10-CM | POA: Diagnosis not present

## 2024-02-10 DIAGNOSIS — M5459 Other low back pain: Secondary | ICD-10-CM

## 2024-02-10 NOTE — Telephone Encounter (Signed)
 Called Mr. Heffern, no answer. Left a voicemail requesting a call back regarding the HST box not working and to get patient scheduled for a WatchPat. Nothing further needed.

## 2024-02-10 NOTE — Telephone Encounter (Signed)
 Thank you Shakimah.   Called Mr. Maselli regarding scheduling a WatchPat HST appointment. No answer, left a voicemail requesting a return call to schedule.

## 2024-02-10 NOTE — Therapy (Signed)
 OUTPATIENT PHYSICAL THERAPY LUMBAR TREATMENT  Progress Note Reporting Period 12/28/23 to 02/10/2024  See note below for Objective Data and Assessment of Progress/Goals.      Patient Name: Noah Lane MRN: [id number] DOB:1956/02/17, 68 y.o., male Today's Date: 02/10/2024  END OF SESSION:  PT End of Session - 02/10/24 0807     Visit Number 10    Date for Recertification  02/22/24    PT Start Time 0803    PT Stop Time 0850    PT Time Calculation (min) 47 min    Activity Tolerance Patient tolerated treatment well;No increased pain    Behavior During Therapy WFL for tasks assessed/performed            Past Medical History:  Diagnosis Date   Broken toe    Diabetes mellitus type 2 in obese    Mixed hyperlipidemia    Past Surgical History:  Procedure Laterality Date   COLON RESECTION N/A 04/27/2022   Procedure: LAPAROSCOPIC HAND ASSISTED RIGHT HEMI COLECTOMY, POSSIBLE OPEN;  Surgeon: Dasie Leonor CROME, MD;  Location: [facility];  Service: General;  Laterality: N/A;   COLONOSCOPY     HERNIA REPAIR  2012   Patient Active Problem List   Diagnosis Date Noted   Elevated blood pressure reading 05/04/2022   Colon adenocarcinoma (HCC) 05/04/2022   Gastroesophageal reflux disease 04/29/2022   Ileus, postoperative (HCC) 04/29/2022   Hypokalemia 04/28/2022   Hyperlipidemia 04/28/2022   Colonic obstruction (HCC) 04/25/2022   Hyponatremia 04/25/2022   OSA on CPAP 12/03/2021   Obesity (BMI 30.0-34.9) 12/03/2021   Mixed hyperlipidemia 10/20/2021   Strain of lumbar region 10/16/2021   Strain of calf muscle 07/22/2021   Patellofemoral pain syndrome of both knees 02/24/2021   Rotator cuff tendinitis, right 12/28/2019   Hip pain, bilateral 05/28/2019   Chronic bilateral low back pain without sciatica 05/28/2019   Type 2 diabetes mellitus with obesity 05/28/2019   Erectile dysfunction 05/28/2019   Pain and swelling of left knee 03/12/2019   Lateral epicondylitis, right elbow  03/15/2017   Diverticulosis of large intestine without hemorrhage 02/25/2014    PCP: Frann Mabel Mt, DO   REFERRING PROVIDER: Dorina Loving, PA-C   REFERRING DIAG: M54.50 (ICD-10-CM) - Acute midline low back pain without sciatica  THERAPY DIAG:  Muscle weakness (generalized)  Other low back pain  RATIONALE FOR EVALUATION AND TREATMENT: Rehabilitation  ONSET DATE: 2 weeks ago  NEXT MD VISIT:    SUBJECTIVE:  SUBJECTIVE STATEMENT: Patient reports feels neck pain has mostly resolved. He would like to focus remaining visits on LBP.  States LBP is about a 2/10  01/04/24 EVAL CERVICAL PAIN:  Patient is reevaluated today for cervical pain.  He has a new order from his PCP for neck pain evaluate/treat.   He reports the pain started several weeks ago for no apparent reason.  He states the pain was on the L side of his cervical spine and top of the L shoulder/upper trap area.   He isn't sure what precipitated it.   Denies any trauma recently.  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   He has had off/on neck pain since that time, but usually gets better.   He states he is feeling better with the neck pain this week than he did last week.   Pain level is only minimal now.   He is still working as a ecologist and admits that he has a fair amount of stress which he feels may contribute to his neck pain.   He does a lot of sitting and driving with his job.   He denies any pain, numbness, tingling into the L arm.    EVAL LBP: 68 y/o patient referred to PT for LBP.   He is well known to us  from recent episodes of PT for this issue with the latest being about 2 months ago.  Patient states 2 weeks ago he started having severe R sided LBP  with any movement.   He reports the pain was severe initially, but is now doing better with taking Celebrex .  States he can't recall any injury that would have precipitated this.  He states that he is currently having a lot of soreness now, but it is not constant.  States feels it more with sit to stand transfers.  Played golf 3-4 weeks ago, but doesn't think this aggravated him.  Doing piriformis stretching and lying flat helps him some.   He has had a back injection many years ago, but nothing since then.  No MRI.  Old xray shows DDD, facet arthropathy.    He does have a long history of off/on back pain with difficulty playing golf at times, which he likes to do.   However he states he is not going to try to play anymore this year, and is going to try to focus on exercises to improve his back pain and prevent reinjury  PAIN: Are you having pain? Yes: NPRS scale: 5/10 now and worst over last week.    Pain location: central low back now Pain description: soreness most of the time; sharp at times Aggravating factors: lifting,  Relieving factors: lying flat  PERTINENT HISTORY:  DM-II, obesity, elevated BP, GERD, B patellofemoral pain syndrome, B hip pain, h/o R RTC tendinitis, chronic B LBP w/o sciatica, scoliosis, diabetic peripheral neuropathy   PRECAUTIONS: None  RED FLAGS: None  WEIGHT BEARING RESTRICTIONS: No  FALLS:  Has patient fallen in last 6 months? No  LIVING ENVIRONMENT: Lives with: lives with their family and lives with their spouse Lives in: House/apartment Stairs: Yes: Internal: 14 steps; on left going up and External: 1 steps; unknown Has following equipment at home: Single point cane and Walker - 2 wheeled  OCCUPATION: pharmacist, hospital, mostly seated desk work and driving  PLOF: Independent  PATIENT GOALS: improve back pain and prevent further flareups/reinjuries  OBJECTIVE: (objective measures completed at initial evaluation unless otherwise  dated)  DIAGNOSTIC FINDINGS:  EXAM: LUMBAR SPINE - 2-3 VIEW   COMPARISON:  None Available.   FINDINGS: Evaluation is limited due to body habitus.   Five lumbar type vertebra. There is no acute fracture or subluxation of the lumbar spine. There is multilevel degenerative changes with disc space narrowing, endplate irregularity and spurring/osteophyte. Multilevel facet arthropathy. The visualized posterior elements appear intact. There is atherosclerotic calcification of the aorta. The soft tissues are grossly unremarkable.   IMPRESSION: No acute fracture or subluxation of the lumbar spine.     Electronically Signed   By: Vanetta Chou M.D.   On: 10/18/2021 20:41  PATIENT SURVEYS:  Modified Oswestry:  MODIFIED OSWESTRY DISABILITY SCALE 02/01/24  Date: 02/10/2024 Score   Pain intensity 2 =  Pain medication provides me with complete relief from pain. 2  2. Personal care (washing, dressing, etc.) 1 =  I can take care of myself normally, but it increases my pain. 0  3. Lifting 4 = I can lift only very light weights 3  4. Walking 1 = Pain prevents me from walking more than 1 mile. 1  5. Sitting 2 =  Pain prevents me from sitting more than 1 hour. 2  6. Standing 4 =  Pain prevents me from standing more than 10 minutes. 2  7. Sleeping 1 = I can sleep well only by using pain medication. 1  8. Social Life 1 =  My social life is normal, but it increases my level of pain. 0  9. Traveling 1 =  I can travel anywhere, but it increases my pain. 1  10. Employment/ Homemaking 2 = I can perform most of my homemaking/job duties, but pain prevents me from performing more physically stressful activities (eg, lifting, vacuuming). 1  Total 19/50 13/50 = 26%   Interpretation of scores: Score Category Description  0-20% Minimal Disability The patient can cope with most living activities. Usually no treatment is indicated apart from advice on lifting, sitting and exercise  21-40% Moderate  Disability The patient experiences more pain and difficulty with sitting, lifting and standing. Travel and social life are more difficult and they may be disabled from work. Personal care, sexual activity and sleeping are not grossly affected, and the patient can usually be managed by conservative means  41-60% Severe Disability Pain remains the main problem in this group, but activities of daily living are affected. These patients require a detailed investigation  61-80% Crippled Back pain impinges on all aspects of the patients life. Positive intervention is required  81-100% Bed-bound These patients are either bed-bound or exaggerating their symptoms  Bluford FORBES Zoe DELENA Karon DELENA, et al. Surgery versus conservative management of stable thoracolumbar fracture: the PRESTO feasibility RCT. [place] ([place]): [employer]; 2021 Nov. [facility] Technology Assessment, No. 25.62.) Appendix 3, Oswestry Disability Index category descriptors. Available from: Findjewelers.cz  Minimally Clinically Important Difference (MCID) = 12.8%   NDI:  NECK DISABILITY INDEX 02/01/24  Date: 02/10/2024 Score   Pain intensity 3 = The pain is fairly severe at the moment 1  2. Personal care (washing, dressing, etc.) 1 =  I can look after myself normally but it causes extra pain 0  3. Lifting 3 = Pain prevents me from lifting heavy weights but I can manage light to medium   weights if they are conveniently positioned 3  4. Reading 2 =  I can read as much as I want  with moderate pain in my neck 0  5. Headaches 2 =  I have moderate headaches, which come infrequently 2  6. Concentration 1 =  I can concentrate fully when I want to with slight difficulty  1  7. Work 1 =  I can only do my usual work, but no more 1  8. Driving 1 =  I can drive my car as long as I want with slight pain in my neck 1  9. Sleeping 1 = My sleep is slightly disturbed (less than 1 hr sleepless) 2  10. Recreation 3 =  I am able to engage in a few of my usual recreation activities because of pain in   my neck 3  Total 18/50 = 36% 14/50 = 28   Minimum Detectable Change (90% confidence): 5 points or 10% points   SCREENING FOR RED FLAGS: Bowel or bladder incontinence: No Spinal tumors: No Cauda equina syndrome: No Compression fracture: No Abdominal aneurysm: No  COGNITION:  Overall cognitive status: Within functional limits for tasks assessed    SENSATION: WFL  POSTURE:  Has some Leftward scoliosis present;   increased lumbar extension/sway position   Supine:  Equal pelvic landmarks;  longer LLE at the malleolus by a small amount   PALPATION: Not specifically painful;   deep palpation of lumbar paraspinals and QL are nonpainful Lumbar posterior/anterior glides and rotational glides are non painful No palpation reproduces his pain  01/04/24:  neck eval:  TTP over the C4-7 transverse processes;  no palpable trigger points throughout L neck;  P/A vertebral glides are some restricted C4-7.  Side glides and rotational glides feel fairly normal  LUMBAR ROM:   Active  Eval 02/07/24  Flexion To knees; some end range pain To knees, tightness, 50 deg on inclinometer  Extension 100% 100%; 40 deg on inclinometer  Right lateral flexion To upper thigh;  stiff feeling Upper thigh  Left lateral flexion To upper thigh; stiff feeling Less than R on upper thigh  Right rotation 75%; stiff 85%  Left rotation 100% 100%; a little more difficult per patient  (Blank rows = not tested)  CERVICAL ROM: 01/04/24  Active ROM A/PROM (deg) eval  Flexion 90%  Extension 45%  Right lateral flexion 50%  Left lateral flexion 50%  Right rotation 80%  Left rotation 75%    No motion is especially painful   MUSCLE LENGTH: Hamstrings: Right SLR = 80 deg; Left SLR = 50 deg Hamstrings: very tight L, mild tight R ITB: NT Piriformis: a little tight BLE Hip flexors: not tight Quads: NT Heelcord: NT  LOWER EXTREMITY  ROM:     Active  Right eval Left eval  Hip flexion    Hip extension    Hip abduction    Hip adduction    Hip internal rotation    Hip external rotation    Knee flexion    Knee extension    Ankle dorsiflexion    Ankle plantarflexion    Ankle inversion    Ankle eversion    (Blank rows = not tested)  LOWER EXTREMITY MMT:    MMT Right eval Left eval 02/07/24 R/L  Hip flexion 4+ 4 4+/4+  Hip extension 3+ 4- 4-/4-  Hip abduction 4- 4 4/4  Hip adduction   NT/4-  Hip internal rotation 4 4- 4+/4  Hip external rotation 4+ 4 4+/4  Knee flexion 4 4- 4+/4+  Knee extension 5 4+ 5/5  Ankle dorsiflexion 5 5 5/5  Ankle  plantarflexion 5 5   Ankle inversion     Ankle eversion      (Blank rows = not tested)  LUMBAR SPECIAL TESTS:  Straight leg raise test: Negative, Slump test: Negative, and Long sit test: Negative   Longer L malleolus but no change with long sit test;  Debby test is good flexibility, but increases his back pain for BLE (denies any SI pain)  CERVICAL SPECIAL TESTS: 01/04/24 Upper limb tension test (ULTT): Negative negative for median, radial, and ulnar nerves and unprovoked with cervical sidebending or rotation or both   FUNCTIONAL TESTS:  TBD if needed  GAIT: Distance walked: into clinic x 200' Assistive device utilized: None Level of assistance: Complete Independence Gait pattern: significant toe out BLE nearly contacting his other heel on swing phase of gait Comments: no limping noted   TODAY'S TREATMENT:  02/10/24 THERAPEUTIC EXERCISE: To improve strength and endurance.  Demonstration, verbal and tactile cues throughout for technique. Elliptical L0 3' F, 3' B  NEUROMUSCULAR RE-EDUCATION: To improve reeducation of movement and isolating gluteus maximum w/ exercises rather than quads. Running man w/ 10# KB x 3/8 BLE Deep back squats x 8 BLE--vc required for hip hinge and form Long stride lunge on 8 step w/ leading leg x 2/8;   lots of teaching for form,  posture, foot placement Standing pallof press GTB x 2/10 BUE  THERAPEUTIC ACTIVITIES: To improve functional performance.  Demonstration, verbal and tactile cues throughout for technique. Seated hamstring stretch x 1' x 4 BLE interspersed throughout the workout Standing hip flexor stretch x 1' x 2 BLE interspersed throughout the workout Standing quad stretch x 1' x 2 BLE interspersed throughout the workout Trunk Sidebending stretch sliding arm up wall and lean away x 10 sec x 5 each direction--patient advised mainly to do this w/ RUE only at home  Rechecked leg length and they are actually equal in supine measuring 39 cm from ASIS to distal medial malleolus.   ASIS are also level  02/07/24 THERAPEUTIC EXERCISE: To improve strength and endurance.  Demonstration, verbal and tactile cues throughout for technique. Elliptical L0 4' F, 4' B  THERAPEUTIC ACTIVITIES: To improve functional performance.  Demonstration, verbal and tactile cues throughout for technique. Seated hamstring stretch x 1' x 2 BLE Seated hamstring stretch w/ leg propped on bed x 1' x 2 BLE Running man exercise with BUE support (no weight) x 2/8 Seated overhead reach lumbar sidebending stretch x 1' x 2 each way Rechecked strength Rechecked lumbar ROM  NEUROMUSCULAR RE-EDUCATION: To improve reeducation of movement and isolating gluteus maximum w/ exercises rather than quads.  Sidestepping between 8 stools w/ 15# KB lift x 8 laps Deep forward lunge w/ contralateral toe slides to neutral F/B to isolate Gmax x 2/8--manual facilitation required for form Deep back squats x 8 BLE--vc required for hip hinge and form   02/01/24 THERAPEUTIC EXERCISE: To improve strength and endurance.  Demonstration, verbal and tactile cues throughout for technique. Elliptical L2 x 2' F; L0 x 2' B  THERAPEUTIC ACTIVITIES: To improve functional performance.  Demonstration, verbal and tactile cues throughout for technique. Supine SLR ham stretch x  1' x 3 BLE Supine curl up and reach x 20 straight;  x 10 diagonals Prone full planks x 3 sec holds x 10  SL hip abd w/ PPT x 2/10 LLE Trunk Sidebending stretch sliding arm up wall and lean away x 1' x 2 each direction Seated hamstring stretch x 1' x 2  NEUROMUSCULAR RE-EDUCATION:  To improve coordination, kinesthesia, posture, and proprioception. Tall kneel on foam w/ doubled blue TB pallof press x 15 each direction Trunk rotation and press x 15 each direction  Chop down stabilization x 15 each direction BUE  01/17/24 NEUROMUSCULAR RE-EDUCATION: To improve coordination, kinesthesia, posture, and proprioception. Foam roll lying:  Shoulder ER w/ cerv retract w/ RTB x 20 BUE  Shoulder HABD w/ cerv retract RTB x 20 BUE  Snow angels w/ cerv retract x 20 BUE  Scapular depression w/ cerv retraction x 20 BUE  D2 PNF extension YTB w/ cerv retraction x 20 BUE  90/90 shoulder ER 1# x 20 BUE  Horizontal foam roll upper thoracic mobilizations x 2/10 hands behind head   MANUAL THERAPY: To promote improved joint mobility and reduced pain utilizing joint mobilization and myofascial release. Grade 3-4 jt mobilizations to C3-7 for P/A glides and rotations R x 20 reps for C6-7;    suboccipital release  01/13/24 THERAPEUTIC EXERCISE: To improve strength and endurance.  Demonstration, verbal and tactile cues throughout for technique. Elliptical L0 x 6' F  THERAPEUTIC ACTIVITIES: To improve functional performance.  Demonstration, verbal and tactile cues throughout for technique. Seated hamstring stretch x 1'  Seated FABER piriformis x 1' BLE SL hip abd x 20 BLE Prone plank x 10 SL plank from knees x 10 B Ab curl up/reach x 2/10  NEUROMUSCULAR RE-EDUCATION: To improve coordination, kinesthesia, posture, and proprioception. Seated 75 swiss ball Palloff press while holding knee extended blue TB x 2/10 BLE each direction Supine bridge w/ palloff press x 2/10 BUE  MANUAL THERAPY: To promote reduced  pain utilizing percussion massage with massage gun. R lumbar paraspinals and quadratus lumborum and posterolateral R hip percussion massage for pain relief  01/11/24 THERAPEUTIC EXERCISE: To improve strength and endurance.  Demonstration, verbal and tactile cues throughout for technique. Elliptical L0 x 5' F  THERAPEUTIC ACTIVITIES: To improve functional performance.  Demonstration, verbal and tactile cues throughout for technique. Supine foam roll:  Cervical and thoracic retraction x 20  Scapular depression x 20  Shoulder ER RTB w/ cerv retraction x 20  Shoulder HABD RTB w/ cerv retract x 20  90/90 shoulder ER x 20 BUE  Hands behind head isometric HABD x 20  Towel cervical extension glides x 8 C3-7 Seated towel rotational SNAGs x 10 to L;  x 10 to R at 3 different towel positions on the neck to encompass all vertebrae Pool noodle under neck cervical rotation x 20 each direction for MFR effects  01/06/24 THERAPEUTIC EXERCISE: To improve strength and endurance.  Demonstration, verbal and tactile cues throughout for technique. Elliptical L0 x 6' F  THERAPEUTIC ACTIVITIES: To improve functional performance.  Demonstration, verbal and tactile cues throughout for technique. Seated hamstring stretch x 1'  Seated FABER piriformis x 1' BLE Seated cross over piriformis x 1' BLE SL hip abd x 30 BLE Prone plank x 10 SL plank from knees x 10 B  NEUROMUSCULAR RE-EDUCATION: To improve coordination, kinesthesia, posture, and proprioception. Seated 75 swiss ball Palloff press while holding knee extended blue TB x 2/10 BLE each direction Quadruped fire hydrant w/ TA and lateral YTB pull from PT x 2/10 BLE each direction  MANUAL THERAPY: To promote reduced pain utilizing percussion massage with massage gun. R lumbar paraspinals and quadratus lumborum  percussion massage for pain relief  01/04/24 PT reevaluation/evaluation for cervical spine pain (see notations above and below for subjective  notes, objective measures, assessment and goals for the cervical  spine)  SELF CARE: Provided education on PT POC progression and initial HEP for cervical spine (see below for detail).   12/28/23 SELF CARE: Provided education on PT POC progression.   PATIENT EDUCATION:  Education details: adding running man and lunge w/ back leg toe slides forward for gluteus maximum strength  Person educated: Patient Education method: Explanation, Demonstration, Verbal cues, Tactile cues, Handouts, and [email or web address] app access provided Education comprehension: verbalized understanding, verbal cues required, tactile cues required, and needs further education  HOME EXERCISE PROGRAM: Access Code: [email or web address] URL: [email or web address] Date: 02/07/2024 Prepared by: Garnette Montclair  Exercises - Prone Hip Extension  - 1 x daily - 7 x weekly - 3 sets - 10 reps - Sidelying Hip Abduction  - 1 x daily - 7 x weekly - 3 sets - 10 reps - Side Plank on Knees  - 1 x daily - 7 x weekly - 1 sets - 10 reps - Plank on Knees  - 1 x daily - 7 x weekly - 1 sets - 10 reps - Standard Plank  - 1 x daily - 7 x weekly - 1 sets - 10 reps - Quadruped Fire Hydrant  - 1 x daily - 7 x weekly - 3 sets - 10 reps - Seated Hamstring Stretch  - 1 x daily - 7 x weekly - 1 sets - 2 reps - 1 min hold - Seated Anti-Rotation Press With Anchored Resistance  - 1 x daily - 7 x weekly - 3 sets - 10 reps - Seated Cervical Sidebending Stretch  - 1 x daily - 7 x weekly - 1 sets - 2 reps - 1 min hold - Mid-Lower Cervical Extension SNAG with Strap  - 1 x daily - 7 x weekly - 1 sets - 10 reps - Supine Cervical Retraction with Towel  - 1 x daily - 7 x weekly - 1 sets - 10 reps - 3 sec hold - Supine Scapular Retraction  - 1 x daily - 7 x weekly - 1 sets - 10 reps - 3 sec hold - Standing Scapular Depression  - 1 x daily - 7 x weekly - 3 sets - 10 reps - Supine Chest Stretch with Elbows Bent  - 1 x daily - 7 x weekly - 1 sets - 10 reps - 3  sec hold - Supine Shoulder External Rotation on Foam Roll with Theraband  - 1 x daily - 7 x weekly - 3 sets - 10 reps - Hooklying Shoulder T  - 1 x daily - 7 x weekly - 3 sets - 10 reps - Supine Shoulder External and Internal Rotation in Abduction with Dumbbell  - 1 x daily - 7 x weekly - 3 sets - 10 reps - Seated Assisted Cervical Rotation with Towel  - 1 x daily - 7 x weekly - 1 sets - 10 reps - Supine PNF D2 Flexion with Resistance  - 1 x daily - 7 x weekly - 3 sets - 10 reps - Snow Angels on Foam Roll  - 1 x daily - 7 x weekly - 3 sets - 10 reps - Single Leg Lunge with Foot on Bench  - 1 x daily - 7 x weekly - 2 sets - 8 reps - Squat with Chair Touch and Resistance Loop  - 1 x daily - 7 x weekly - 2 sets - 8 reps - Seated Sidebending Arms Overhead  - 1 x daily - 7 x weekly - 1 sets -  2 reps - 1 min hold  ASSESSMENT:  CLINICAL IMPRESSION: Patient has been seen x 10 PT visits for acute midline back pain without sciatica.   HE also was given a referral after starting PT for neck pain.    We have addressed both of these issues, and he is making good progress to goals, but has not yet attained the goals that we have set for him.  However, he has good rehab potential to meet his goals.   His neck pain has resolved and he is no longer needing treatment for this.   Back pain is more recalcitrant, but is improving.  Modified ODI score has improved from 38% to 26%.   Lumbar ROM and strength have been slower to progress.   Patient is able to isolate gluteus maximus with better form today on running man and back squat exercise.  He is advised better technique is essential to building strength.  L shoulder is a little lower in standing posture.  Hamstrings remain quite tight.   He reports his pain is more lateral R spine now along quadratus and R flank than it is midline.  PT remains necessary for strength, postural retraining, flexibility.  Continue per POC  01/04/24 NECK EVAL:  Patient is seen today for  cervical spine pain and it is felt to be due at least in part to OA 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  The flaring up and improvement cycle is also somewhat consistent with OA pain.   He exhibits considerable cervical spine stiffness with looking up and likely has some foraminal crowding.  He has no recent imaging for the neck.   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  No crepitus is felt with cervical ROM.   He has decreased posterior/anterior vertebral glide mobility from C4-7.  There are no palpable trigger points and nerve tension tests are negative for the LUE.  He has no radicular symptoms in the LUE.   He demonstrates forward head posture with sloped shoulders and upper thoracic kyphosis.   Merwyn would benefit from PT to address cervical ROM/stiffness, posture, and pain deficits.    He is agreeable to the POC  BACK EVAL:  Norah Fick is a 68 y.o. male who was referred to physical therapy for evaluation and treatment for acute on chronic LBP   Patient reports onset of  pain beginning 2 weeks ago for no apparent reason.   However, he saw PCP and started celebrex  and is feeling better.    Pain is worse with sit to stand transfers and bending over/lifting and sitting or standing prolonged periods of time.  He spends a lot of time in the car and at his desk.  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  He has a slightly longer LLE and a little leftward leaning sway back posture.  Patient has deficits in lumbar ROM, hamstring LE flexibility, BLE strength, abnormal posture with some leftward lean, and residual pain which are interfering with ADLs and are impacting quality of life.  On Modified Oswestry patient scored 19/50 demonstrating 38% or moderate disability.  Jeriah will benefit from skilled PT to address above deficits to improve mobility and activity tolerance with decreased pain interference.   OF note, he is also having neck pain and is going to speak with PCP about a referral  to address the cervical spine as well.  We will look for this order  OBJECTIVE IMPAIRMENTS: difficulty walking, decreased ROM,  decreased strength, postural dysfunction, and pain.   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   REHAB POTENTIAL: Good  CLINICAL DECISION MAKING: Evolving/moderate complexity  EVALUATION COMPLEXITY: Moderate   GOALS: Goals reviewed with patient? Yes  SHORT TERM GOALS: Target date: 01/25/2024   Patient will be independent with initial HEP to improve outcomes and carryover.  Baseline: 100% PT assist required for correct completion 01/04/24:  initial HEP for cervical pain initiated today 11/12:  progressing with initial HEP for cervical spine today Goal status: MET   2.  Patient will report 25% improvement in low back pain to improve QOL. Baseline: 5/10 Goal status: INITIAL  3.  Patient will report 50% subjective improvement in L sided cervical pain   Baseline:  11/12:  2/10  Goal status:  INITIAL on 01/04/24  LONG TERM GOALS: Target date: 02/22/2024   Patient will be independent with ongoing/advanced HEP for self-management at home.  Baseline: no advanced HEP yet 01/11/24:  advancing Goal status: IN PROGRESS  2.  Patient will report 50-75% improvement in low back pain to improve QOL.  Baseline: 5/10 02/01/24:  2/10 02/07/24:  4/10 today Goal status: IN PROGRESS  3.  Patient to demonstrate ability to achieve and maintain good spinal alignment/posturing and body mechanics needed for daily activities. Baseline: leftward lean with sway back Goal status: IN PROGRESS  4.  Patient will  demonstrate full pain free lumbar ROM to perform ADLs.   Baseline: Refer to above lumbar ROM table 02/07/24:  see updated tables Goal status: IN PROGRESS  5.  Patient will demonstrate improved BLE strength to >/= 5/5 for improved stability and ease of mobility. Baseline: Refer to above LE MMT table 02/07/24:  see updated tables Goal status: IN PROGRESS  6. Patient will report </= 20% on Modified Oswestry (MCID = 12%) to demonstrate improved functional ability with decreased pain interference. Baseline: 38% 02/01/24:  26% Goal status: IN PROGRESS  7.  Patient will tolerate 30 min of (standing/sitting/walking) w/o increased pain to allow for  improved mobility and activity tolerance. Baseline: states pain begins after 10 min standing/walking Goal status: INITIAL   8.  Patient will demonstrate 60-80% cervical extension to be able to look up to do work around the house     Baseline:  see ROM tables above   Goal status:  INITIAL       9.  Patient will report >/= 75% improvement in neck pain    01/11/24:  2/10 02/10/24: 0/10 Goal status:  MET  10.  New Goal 02/01/24:  NDI score will improve to >/= 20% to reflect improved activity tolerance and QOL  Baseline:28%   Goal status:  INITIAL  PLAN:  PT FREQUENCY: 1-2x/week  PT DURATION: 8 weeks  PLANNED INTERVENTIONS: 97164- PT Re-evaluation, 97110-Therapeutic exercises, 97530- Therapeutic activity, 97112- Neuromuscular re-education, 97535- Self Care, 02859- Manual therapy, [id number]- Electrical stimulation (unattended), 97035- Ultrasound, 02987- Traction (mechanical), [id number]- Ionotophoresis 4mg /ml Dexamethasone , 79439 (1-2 muscles), 20561 (3+ muscles)- Dry Needling, Patient/Family education, Balance training, Taping, Joint mobilization, Spinal mobilization, Cryotherapy, and Moist heat  PLAN FOR NEXT SESSION:  Continue to progress glut and core strength with back and hip flexibility;  Resisted sidestepping/monster walks, lateral shuffle w/ KB  lifts,  squats, running man, etc    Jalesia Loudenslager, PT 02/10/2024, 9:10 AM

## 2024-02-14 ENCOUNTER — Ambulatory Visit: Admitting: Rehabilitation

## 2024-02-14 ENCOUNTER — Encounter: Payer: Self-pay | Admitting: Rehabilitation

## 2024-02-14 ENCOUNTER — Telehealth (HOSPITAL_BASED_OUTPATIENT_CLINIC_OR_DEPARTMENT_OTHER): Payer: Self-pay

## 2024-02-14 ENCOUNTER — Ambulatory Visit

## 2024-02-14 DIAGNOSIS — M5459 Other low back pain: Secondary | ICD-10-CM

## 2024-02-14 DIAGNOSIS — R293 Abnormal posture: Secondary | ICD-10-CM

## 2024-02-14 DIAGNOSIS — M5412 Radiculopathy, cervical region: Secondary | ICD-10-CM

## 2024-02-14 DIAGNOSIS — M6281 Muscle weakness (generalized): Secondary | ICD-10-CM | POA: Diagnosis not present

## 2024-02-14 NOTE — Therapy (Signed)
 OUTPATIENT PHYSICAL THERAPY LUMBAR TREATMENT   Patient Name: Noah Lane MRN: [id number] DOB:1955/05/28, 68 y.o., male Today's Date: 02/14/2024  END OF SESSION:  PT End of Session - 02/14/24 0808     Visit Number 11    Date for Recertification  02/22/24    PT Start Time 0805    PT Stop Time 0845    PT Time Calculation (min) 40 min    Activity Tolerance Patient tolerated treatment well;No increased pain    Behavior During Therapy WFL for tasks assessed/performed            Past Medical History:  Diagnosis Date   Broken toe    Diabetes mellitus type 2 in obese    Mixed hyperlipidemia    Past Surgical History:  Procedure Laterality Date   COLON RESECTION N/A 04/27/2022   Procedure: LAPAROSCOPIC HAND ASSISTED RIGHT HEMI COLECTOMY, POSSIBLE OPEN;  Surgeon: Dasie Leonor CROME, MD;  Location: [facility];  Service: General;  Laterality: N/A;   COLONOSCOPY     HERNIA REPAIR  2012   Patient Active Problem List   Diagnosis Date Noted   Elevated blood pressure reading 05/04/2022   Colon adenocarcinoma (HCC) 05/04/2022   Gastroesophageal reflux disease 04/29/2022   Ileus, postoperative (HCC) 04/29/2022   Hypokalemia 04/28/2022   Hyperlipidemia 04/28/2022   Colonic obstruction (HCC) 04/25/2022   Hyponatremia 04/25/2022   OSA on CPAP 12/03/2021   Obesity (BMI 30.0-34.9) 12/03/2021   Mixed hyperlipidemia 10/20/2021   Strain of lumbar region 10/16/2021   Strain of calf muscle 07/22/2021   Patellofemoral pain syndrome of both knees 02/24/2021   Rotator cuff tendinitis, right 12/28/2019   Hip pain, bilateral 05/28/2019   Chronic bilateral low back pain without sciatica 05/28/2019   Type 2 diabetes mellitus with obesity 05/28/2019   Erectile dysfunction 05/28/2019   Pain and swelling of left knee 03/12/2019   Lateral epicondylitis, right elbow 03/15/2017   Diverticulosis of large intestine without hemorrhage 02/25/2014    PCP: Frann Mabel Mt, DO   REFERRING  PROVIDER: Dorina Loving, PA-C   REFERRING DIAG: M54.50 (ICD-10-CM) - Acute midline low back pain without sciatica  THERAPY DIAG:  Muscle weakness (generalized)  Other low back pain  Radiculopathy, cervical region  Abnormal posture  RATIONALE FOR EVALUATION AND TREATMENT: Rehabilitation  ONSET DATE: 2 weeks ago  NEXT MD VISIT:    SUBJECTIVE:  SUBJECTIVE STATEMENT: Patient reports R LBP today is 2 to 2.5/10.  Feels better when he does his exercises.  01/04/24 EVAL CERVICAL PAIN:  Patient is reevaluated today for cervical pain.  He has a new order from his PCP for neck pain evaluate/treat.   He reports the pain started several weeks ago for no apparent reason.  He states the pain was on the L side of his cervical spine and top of the L shoulder/upper trap area.   He isn't sure what precipitated it.   Denies any trauma recently.  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   He has had off/on neck pain since that time, but usually gets better.   He states he is feeling better with the neck pain this week than he did last week.   Pain level is only minimal now.   He is still working as a ecologist and admits that he has a fair amount of stress which he feels may contribute to his neck pain.   He does a lot of sitting and driving with his job.   He denies any pain, numbness, tingling into the L arm.    EVAL LBP: 68 y/o patient referred to PT for LBP.   He is well known to us  from recent episodes of PT for this issue with the latest being about 2 months ago.  Patient states 2 weeks ago he started having severe R sided LBP with any movement.   He reports the pain was severe initially, but is now doing better with taking Celebrex .  States he  can't recall any injury that would have precipitated this.  He states that he is currently having a lot of soreness now, but it is not constant.  States feels it more with sit to stand transfers.  Played golf 3-4 weeks ago, but doesn't think this aggravated him.  Doing piriformis stretching and lying flat helps him some.   He has had a back injection many years ago, but nothing since then.  No MRI.  Old xray shows DDD, facet arthropathy.    He does have a long history of off/on back pain with difficulty playing golf at times, which he likes to do.   However he states he is not going to try to play anymore this year, and is going to try to focus on exercises to improve his back pain and prevent reinjury  PAIN: Are you having pain? Yes: NPRS scale: 5/10 now and worst over last week.    Pain location: central low back now Pain description: soreness most of the time; sharp at times Aggravating factors: lifting,  Relieving factors: lying flat  PERTINENT HISTORY:  DM-II, obesity, elevated BP, GERD, B patellofemoral pain syndrome, B hip pain, h/o R RTC tendinitis, chronic B LBP w/o sciatica, scoliosis, diabetic peripheral neuropathy   PRECAUTIONS: None  RED FLAGS: None  WEIGHT BEARING RESTRICTIONS: No  FALLS:  Has patient fallen in last 6 months? No  LIVING ENVIRONMENT: Lives with: lives with their family and lives with their spouse Lives in: House/apartment Stairs: Yes: Internal: 14 steps; on left going up and External: 1 steps; unknown Has following equipment at home: Single point cane and Walker - 2 wheeled  OCCUPATION: pharmacist, hospital, mostly seated desk work and driving  PLOF: Independent  PATIENT GOALS: improve back pain and prevent further flareups/reinjuries   OBJECTIVE: (objective measures completed at initial  evaluation unless otherwise dated)  DIAGNOSTIC FINDINGS:  EXAM: LUMBAR SPINE - 2-3 VIEW   COMPARISON:  None Available.   FINDINGS: Evaluation is  limited due to body habitus.   Five lumbar type vertebra. There is no acute fracture or subluxation of the lumbar spine. There is multilevel degenerative changes with disc space narrowing, endplate irregularity and spurring/osteophyte. Multilevel facet arthropathy. The visualized posterior elements appear intact. There is atherosclerotic calcification of the aorta. The soft tissues are grossly unremarkable.   IMPRESSION: No acute fracture or subluxation of the lumbar spine.     Electronically Signed   By: Vanetta Chou M.D.   On: 10/18/2021 20:41  PATIENT SURVEYS:  Modified Oswestry:  MODIFIED OSWESTRY DISABILITY SCALE 02/01/24  Date: 02/14/2024 Score   Pain intensity 2 =  Pain medication provides me with complete relief from pain. 2  2. Personal care (washing, dressing, etc.) 1 =  I can take care of myself normally, but it increases my pain. 0  3. Lifting 4 = I can lift only very light weights 3  4. Walking 1 = Pain prevents me from walking more than 1 mile. 1  5. Sitting 2 =  Pain prevents me from sitting more than 1 hour. 2  6. Standing 4 =  Pain prevents me from standing more than 10 minutes. 2  7. Sleeping 1 = I can sleep well only by using pain medication. 1  8. Social Life 1 =  My social life is normal, but it increases my level of pain. 0  9. Traveling 1 =  I can travel anywhere, but it increases my pain. 1  10. Employment/ Homemaking 2 = I can perform most of my homemaking/job duties, but pain prevents me from performing more physically stressful activities (eg, lifting, vacuuming). 1  Total 19/50 13/50 = 26%   Interpretation of scores: Score Category Description  0-20% Minimal Disability The patient can cope with most living activities. Usually no treatment is indicated apart from advice on lifting, sitting and exercise  21-40% Moderate Disability The patient experiences more pain and difficulty with sitting, lifting and standing. Travel and social life are more  difficult and they may be disabled from work. Personal care, sexual activity and sleeping are not grossly affected, and the patient can usually be managed by conservative means  41-60% Severe Disability Pain remains the main problem in this group, but activities of daily living are affected. These patients require a detailed investigation  61-80% Crippled Back pain impinges on all aspects of the patients life. Positive intervention is required  81-100% Bed-bound These patients are either bed-bound or exaggerating their symptoms  Bluford FORBES Zoe DELENA Karon DELENA, et al. Surgery versus conservative management of stable thoracolumbar fracture: the PRESTO feasibility RCT. [place] ([place]): [employer]; 2021 Nov. [facility] Technology Assessment, No. 25.62.) Appendix 3, Oswestry Disability Index category descriptors. Available from: [email or web address].cz  Minimally Clinically Important Difference (MCID) = 12.8%   NDI:  NECK DISABILITY INDEX 02/01/24  Date: 02/14/2024 Score   Pain intensity 3 = The pain is fairly severe at the moment 1  2. Personal care (washing, dressing, etc.) 1 =  I can look after myself normally but it causes extra pain 0  3. Lifting 3 = Pain prevents me from lifting heavy weights but I can manage light to medium   weights if they are conveniently positioned 3  4. Reading 2 =  I can read as much as I want with moderate pain in my neck  0  5. Headaches 2 =  I have moderate headaches, which come infrequently 2  6. Concentration 1 =  I can concentrate fully when I want to with slight difficulty  1  7. Work 1 =  I can only do my usual work, but no more 1  8. Driving 1 =  I can drive my car as long as I want with slight pain in my neck 1  9. Sleeping 1 = My sleep is slightly disturbed (less than 1 hr sleepless) 2  10. Recreation 3 = I am able to engage in a few of my usual recreation activities because of pain in   my neck 3  Total 18/50 = 36% 14/50 = 28    Minimum Detectable Change (90% confidence): 5 points or 10% points   SCREENING FOR RED FLAGS: Bowel or bladder incontinence: No Spinal tumors: No Cauda equina syndrome: No Compression fracture: No Abdominal aneurysm: No  COGNITION:  Overall cognitive status: Within functional limits for tasks assessed    SENSATION: WFL  POSTURE:  Has some Leftward scoliosis present;   increased lumbar extension/sway position   Supine:  Equal pelvic landmarks;  longer LLE at the malleolus by a small amount   PALPATION: Not specifically painful;   deep palpation of lumbar paraspinals and QL are nonpainful Lumbar posterior/anterior glides and rotational glides are non painful No palpation reproduces his pain  01/04/24:  neck eval:  TTP over the C4-7 transverse processes;  no palpable trigger points throughout L neck;  P/A vertebral glides are some restricted C4-7.  Side glides and rotational glides feel fairly normal  LUMBAR ROM:   Active  Eval 02/07/24  Flexion To knees; some end range pain To knees, tightness, 50 deg on inclinometer  Extension 100% 100%; 40 deg on inclinometer  Right lateral flexion To upper thigh;  stiff feeling Upper thigh  Left lateral flexion To upper thigh; stiff feeling Less than R on upper thigh  Right rotation 75%; stiff 85%  Left rotation 100% 100%; a little more difficult per patient  (Blank rows = not tested)  CERVICAL ROM: 01/04/24  Active ROM A/PROM (deg) eval  Flexion 90%  Extension 45%  Right lateral flexion 50%  Left lateral flexion 50%  Right rotation 80%  Left rotation 75%    No motion is especially painful   MUSCLE LENGTH: Hamstrings: Right SLR = 80 deg; Left SLR = 50 deg Hamstrings: very tight L, mild tight R ITB: NT Piriformis: a little tight BLE Hip flexors: not tight Quads: NT Heelcord: NT  LOWER EXTREMITY ROM:     Active  Right eval Left eval  Hip flexion    Hip extension    Hip abduction    Hip adduction    Hip internal  rotation    Hip external rotation    Knee flexion    Knee extension    Ankle dorsiflexion    Ankle plantarflexion    Ankle inversion    Ankle eversion    (Blank rows = not tested)  LOWER EXTREMITY MMT:    MMT Right eval Left eval 02/07/24 R/L  Hip flexion 4+ 4 4+/4+  Hip extension 3+ 4- 4-/4-  Hip abduction 4- 4 4/4  Hip adduction   NT/4-  Hip internal rotation 4 4- 4+/4  Hip external rotation 4+ 4 4+/4  Knee flexion 4 4- 4+/4+  Knee extension 5 4+ 5/5  Ankle dorsiflexion 5 5 5/5  Ankle plantarflexion 5 5   Ankle  inversion     Ankle eversion      (Blank rows = not tested)  LUMBAR SPECIAL TESTS:  Straight leg raise test: Negative, Slump test: Negative, and Long sit test: Negative   Longer L malleolus but no change with long sit test;  Debby test is good flexibility, but increases his back pain for BLE (denies any SI pain)  CERVICAL SPECIAL TESTS: 01/04/24 Upper limb tension test (ULTT): Negative negative for median, radial, and ulnar nerves and unprovoked with cervical sidebending or rotation or both   FUNCTIONAL TESTS:  TBD if needed  GAIT: Distance walked: into clinic x 200' Assistive device utilized: None Level of assistance: Complete Independence Gait pattern: significant toe out BLE nearly contacting his other heel on swing phase of gait Comments: no limping noted   TODAY'S TREATMENT:  02/14/24 THERAPEUTIC EXERCISE: To improve strength and endurance.  Demonstration, verbal and tactile cues throughout for technique. Elliptical L0 3' F, 3' B  NEUROMUSCULAR RE-EDUCATION: To improve reeducation of movement and isolating gluteus maximum w/ exercises rather than quads. Running man w/ 10# KB x 3/8 BLE Deep back squats x 8 BLE--teaching still required for correct amount of hip flexion with nose ahead of toes Long stride lunge on 8 step w/ leading leg x 2/8;   better form today  THERAPEUTIC ACTIVITIES: To improve functional performance.  Demonstration, verbal and  tactile cues throughout for technique. Seated hamstring stretch x 1' x 4 BLE interspersed throughout the workout Standing piriformis stretch x 1' x 2 BLE interspersed throughout the workout Standing quad stretch x 1' x 2 BLE interspersed throughout the workout Trunk Sidebending stretch sliding arm up wall and lean away x 10 sec x 5 each direction--patient advised mainly to do this w/ RUE only  at home Resisted sidestepping black TB at knees x 20' x 4 Monster walks black TB at knees 20' x 4    02/10/24 THERAPEUTIC EXERCISE: To improve strength and endurance.  Demonstration, verbal and tactile cues throughout for technique. Elliptical L0 3' F, 3' B  NEUROMUSCULAR RE-EDUCATION: To improve reeducation of movement and isolating gluteus maximum w/ exercises rather than quads. Running man w/ 10# KB x 3/8 BLE Deep back squats x 8 BLE--vc required for hip hinge and form Long stride lunge on 8 step w/ leading leg x 2/8;   lots of teaching for form, posture, foot placement Standing pallof press GTB x 2/10 BUE  THERAPEUTIC ACTIVITIES: To improve functional performance.  Demonstration, verbal and tactile cues throughout for technique. Seated hamstring stretch x 1' x 4 BLE interspersed throughout the workout Standing hip flexor stretch x 1' x 2 BLE interspersed throughout the workout Standing quad stretch x 1' x 2 BLE interspersed throughout the workout Trunk Sidebending stretch sliding arm up wall and lean away x 10 sec x 5 each direction--patient advised mainly to do this w/ RUE only at home  Rechecked leg length and they are actually equal in supine measuring 39 cm from ASIS to distal medial malleolus.   ASIS are also level  02/07/24 THERAPEUTIC EXERCISE: To improve strength and endurance.  Demonstration, verbal and tactile cues throughout for technique. Elliptical L0 4' F, 4' B  THERAPEUTIC ACTIVITIES: To improve functional performance.  Demonstration, verbal and tactile cues throughout for  technique. Seated hamstring stretch x 1' x 2 BLE Seated hamstring stretch w/ leg propped on bed x 1' x 2 BLE Running man exercise with BUE support (no weight) x 2/8 Seated overhead reach lumbar sidebending stretch x  1' x 2 each way Rechecked strength Rechecked lumbar ROM  NEUROMUSCULAR RE-EDUCATION: To improve reeducation of movement and isolating gluteus maximum w/ exercises rather than quads.  Sidestepping between 8 stools w/ 15# KB lift x 8 laps Deep forward lunge w/ contralateral toe slides to neutral F/B to isolate Gmax x 2/8--manual facilitation required for form Deep back squats x 8 BLE--vc required for hip hinge and form   02/01/24 THERAPEUTIC EXERCISE: To improve strength and endurance.  Demonstration, verbal and tactile cues throughout for technique. Elliptical L2 x 2' F; L0 x 2' B  THERAPEUTIC ACTIVITIES: To improve functional performance.  Demonstration, verbal and tactile cues throughout for technique. Supine SLR ham stretch x 1' x 3 BLE Supine curl up and reach x 20 straight;  x 10 diagonals Prone full planks x 3 sec holds x 10  SL hip abd w/ PPT x 2/10 LLE Trunk Sidebending stretch sliding arm up wall and lean away x 1' x 2 each direction Seated hamstring stretch x 1' x 2  NEUROMUSCULAR RE-EDUCATION: To improve coordination, kinesthesia, posture, and proprioception. Tall kneel on foam w/ doubled blue TB pallof press x 15 each direction Trunk rotation and press x 15 each direction  Chop down stabilization x 15 each direction BUE  01/17/24 NEUROMUSCULAR RE-EDUCATION: To improve coordination, kinesthesia, posture, and proprioception. Foam roll lying:  Shoulder ER w/ cerv retract w/ RTB x 20 BUE  Shoulder HABD w/ cerv retract RTB x 20 BUE  Snow angels w/ cerv retract x 20 BUE  Scapular depression w/ cerv retraction x 20 BUE  D2 PNF extension YTB w/ cerv retraction x 20 BUE  90/90 shoulder ER 1# x 20 BUE  Horizontal foam roll upper thoracic mobilizations x 2/10  hands behind head   MANUAL THERAPY: To promote improved joint mobility and reduced pain utilizing joint mobilization and myofascial release. Grade 3-4 jt mobilizations to C3-7 for P/A glides and rotations R x 20 reps for C6-7;    suboccipital release  01/13/24 THERAPEUTIC EXERCISE: To improve strength and endurance.  Demonstration, verbal and tactile cues throughout for technique. Elliptical L0 x 6' F  THERAPEUTIC ACTIVITIES: To improve functional performance.  Demonstration, verbal and tactile cues throughout for technique. Seated hamstring stretch x 1'  Seated FABER piriformis x 1' BLE SL hip abd x 20 BLE Prone plank x 10 SL plank from knees x 10 B Ab curl up/reach x 2/10  NEUROMUSCULAR RE-EDUCATION: To improve coordination, kinesthesia, posture, and proprioception. Seated 75 swiss ball Palloff press while holding knee extended blue TB x 2/10 BLE each direction Supine bridge w/ palloff press x 2/10 BUE  MANUAL THERAPY: To promote reduced pain utilizing percussion massage with massage gun. R lumbar paraspinals and quadratus lumborum and posterolateral R hip percussion massage for pain relief  01/11/24 THERAPEUTIC EXERCISE: To improve strength and endurance.  Demonstration, verbal and tactile cues throughout for technique. Elliptical L0 x 5' F  THERAPEUTIC ACTIVITIES: To improve functional performance.  Demonstration, verbal and tactile cues throughout for technique. Supine foam roll:  Cervical and thoracic retraction x 20  Scapular depression x 20  Shoulder ER RTB w/ cerv retraction x 20  Shoulder HABD RTB w/ cerv retract x 20  90/90 shoulder ER x 20 BUE  Hands behind head isometric HABD x 20  Towel cervical extension glides x 8 C3-7 Seated towel rotational SNAGs x 10 to L;  x 10 to R at 3 different towel positions on the neck to encompass all vertebrae Pool noodle  under neck cervical rotation x 20 each direction for MFR effects  01/06/24 THERAPEUTIC EXERCISE: To improve  strength and endurance.  Demonstration, verbal and tactile cues throughout for technique. Elliptical L0 x 6' F  THERAPEUTIC ACTIVITIES: To improve functional performance.  Demonstration, verbal and tactile cues throughout for technique. Seated hamstring stretch x 1'  Seated FABER piriformis x 1' BLE Seated cross over piriformis x 1' BLE SL hip abd x 30 BLE Prone plank x 10 SL plank from knees x 10 B  NEUROMUSCULAR RE-EDUCATION: To improve coordination, kinesthesia, posture, and proprioception. Seated 75 swiss ball Palloff press while holding knee extended blue TB x 2/10 BLE each direction Quadruped fire hydrant w/ TA and lateral YTB pull from PT x 2/10 BLE each direction  MANUAL THERAPY: To promote reduced pain utilizing percussion massage with massage gun. R lumbar paraspinals and quadratus lumborum  percussion massage for pain relief  01/04/24 PT reevaluation/evaluation for cervical spine pain (see notations above and below for subjective notes, objective measures, assessment and goals for the cervical spine)  SELF CARE: Provided education on PT POC progression and initial HEP for cervical spine (see below for detail).   12/28/23 SELF CARE: Provided education on PT POC progression.   PATIENT EDUCATION:  Education details: adding running man and lunge w/ back leg toe slides forward for gluteus maximum strength  Person educated: Patient Education method: Explanation, Demonstration, Verbal cues, Tactile cues, Handouts, and MedBridgeGO app access provided Education comprehension: verbalized understanding, verbal cues required, tactile cues required, and needs further education  HOME EXERCISE PROGRAM: Access Code: [email or web address] URL: [email or web address] Date: 02/07/2024 Prepared by: Garnette Montclair  Exercises - Prone Hip Extension  - 1 x daily - 7 x weekly - 3 sets - 10 reps - Sidelying Hip Abduction  - 1 x daily - 7 x weekly - 3 sets - 10 reps - Side Plank on  Knees  - 1 x daily - 7 x weekly - 1 sets - 10 reps - Plank on Knees  - 1 x daily - 7 x weekly - 1 sets - 10 reps - Standard Plank  - 1 x daily - 7 x weekly - 1 sets - 10 reps - Quadruped Fire Hydrant  - 1 x daily - 7 x weekly - 3 sets - 10 reps - Seated Hamstring Stretch  - 1 x daily - 7 x weekly - 1 sets - 2 reps - 1 min hold - Seated Anti-Rotation Press With Anchored Resistance  - 1 x daily - 7 x weekly - 3 sets - 10 reps - Seated Cervical Sidebending Stretch  - 1 x daily - 7 x weekly - 1 sets - 2 reps - 1 min hold - Mid-Lower Cervical Extension SNAG with Strap  - 1 x daily - 7 x weekly - 1 sets - 10 reps - Supine Cervical Retraction with Towel  - 1 x daily - 7 x weekly - 1 sets - 10 reps - 3 sec hold - Supine Scapular Retraction  - 1 x daily - 7 x weekly - 1 sets - 10 reps - 3 sec hold - Standing Scapular Depression  - 1 x daily - 7 x weekly - 3 sets - 10 reps - Supine Chest Stretch with Elbows Bent  - 1 x daily - 7 x weekly - 1 sets - 10 reps - 3 sec hold - Supine Shoulder External Rotation on Foam Roll with Theraband  - 1 x daily - 7 x weekly -  3 sets - 10 reps - Hooklying Shoulder T  - 1 x daily - 7 x weekly - 3 sets - 10 reps - Supine Shoulder External and Internal Rotation in Abduction with Dumbbell  - 1 x daily - 7 x weekly - 3 sets - 10 reps - Seated Assisted Cervical Rotation with Towel  - 1 x daily - 7 x weekly - 1 sets - 10 reps - Supine PNF D2 Flexion with Resistance  - 1 x daily - 7 x weekly - 3 sets - 10 reps - Snow Angels on Foam Roll  - 1 x daily - 7 x weekly - 3 sets - 10 reps - Single Leg Lunge with Foot on Bench  - 1 x daily - 7 x weekly - 2 sets - 8 reps - Squat with Chair Touch and Resistance Loop  - 1 x daily - 7 x weekly - 2 sets - 8 reps - Seated Sidebending Arms Overhead  - 1 x daily - 7 x weekly - 1 sets - 2 reps - 1 min hold  ASSESSMENT:  CLINICAL IMPRESSION: Patient is able to complete all exercises with good tolerance.   Some fatigue early with glut max  exercises, but he reports doing these exercises yesterday.   His HEP is updated and he will need printouts next visit if he can complete with proper form.  Plan is for 2 more PT visits and then D/C to home program.  Discussed this plan with patient today and he is in agreement.   His neck seems to be doing considerably better.   Still with residual R LBP.   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  The flaring up and improvement cycle is also somewhat consistent with OA pain.   He exhibits considerable cervical spine stiffness with looking up and likely has some foraminal crowding.  He has no recent imaging for the neck.   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  No crepitus is felt with cervical ROM.   He has decreased posterior/anterior vertebral glide mobility from C4-7.  There are no palpable trigger points and nerve tension tests are negative for the LUE.  He has no radicular symptoms in the LUE.   He demonstrates forward head posture with sloped shoulders and upper thoracic kyphosis.   Todd would benefit from PT to address cervical ROM/stiffness, posture, and pain deficits.    He is agreeable to the POC  BACK EVAL:  Quanell Loughney is a 68 y.o. male who was referred to physical therapy for evaluation and treatment for acute on chronic LBP   Patient reports onset of  pain beginning 2 weeks ago for no apparent reason.   However, he saw PCP and started celebrex  and is feeling better.    Pain is worse with sit to stand transfers and bending over/lifting and sitting or standing prolonged periods of time.  He spends a lot of time in the car and at his desk.  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  He has a slightly longer LLE and a little leftward leaning sway back posture.  Patient has deficits in lumbar ROM, hamstring LE flexibility, BLE strength, abnormal posture with some  leftward lean, and residual pain which are interfering with ADLs and are impacting quality of life.  On Modified  Oswestry patient scored 19/50 demonstrating 38% or moderate disability.  Ovide will benefit from skilled PT to address above deficits to improve mobility and activity tolerance with decreased pain interference.   OF note, he is also having neck pain and is going to speak with PCP about a referral to address the cervical spine as well.  We will look for this order  OBJECTIVE IMPAIRMENTS: difficulty walking, decreased ROM, decreased strength, postural dysfunction, and pain.   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   REHAB POTENTIAL: Good  CLINICAL DECISION MAKING: Evolving/moderate complexity  EVALUATION COMPLEXITY: Moderate   GOALS: Goals reviewed with patient? Yes  SHORT TERM GOALS: Target date: 01/25/2024   Patient will be independent with initial HEP to improve outcomes and carryover.  Baseline: 100% PT assist required for correct completion 01/04/24:  initial HEP for cervical pain initiated today 11/12:  progressing with initial HEP for cervical spine today Goal status: MET   2.  Patient will report 25% improvement in low back pain to improve QOL. Baseline: 5/10 Goal status: INITIAL  3.  Patient will report 50% subjective improvement in L sided cervical pain   Baseline:  11/12:  2/10  Goal status:  INITIAL on 01/04/24  LONG TERM GOALS: Target date: 02/22/2024   Patient will be independent with ongoing/advanced HEP for self-management at home.  Baseline: no advanced HEP yet 01/11/24:  advancing Goal  status: IN PROGRESS  2.  Patient will report 50-75% improvement in low back pain to improve QOL.  Baseline: 5/10 02/01/24:  2/10 02/07/24:  4/10 today 02/14/24:  2/10 Goal status: IN PROGRESS  3.  Patient to demonstrate ability to achieve and maintain good spinal alignment/posturing and body mechanics needed for daily activities. Baseline: leftward lean with sway back Goal status: IN PROGRESS  4.  Patient will demonstrate full pain free lumbar ROM to perform ADLs.   Baseline: Refer to above lumbar ROM table 02/07/24:  see updated tables Goal status: IN PROGRESS  5.  Patient will demonstrate improved BLE strength to >/= 5/5 for improved stability and ease of mobility. Baseline: Refer to above LE MMT table 02/07/24:  see updated tables Goal status: IN PROGRESS  6. Patient will report </= 20% on Modified Oswestry (MCID = 12%) to demonstrate improved functional ability with decreased pain interference. Baseline: 38% 02/01/24:  26% Goal status: IN PROGRESS  7.  Patient will tolerate 30 min of (standing/sitting/walking) w/o increased pain to allow for  improved mobility and activity tolerance. Baseline: states pain begins after 10 min standing/walking Goal status: INITIAL   8.  Patient will demonstrate 60-80% cervical extension to be able to look up to do work around the house     Baseline:  see ROM tables above   Goal status:  INITIAL       9.  Patient will report >/= 75% improvement in neck pain    01/11/24:  2/10 02/10/24: 0/10 Goal status:  MET  10.  New Goal 02/01/24:  NDI score will improve to >/= 20% to reflect improved activity tolerance and QOL  Baseline:28%   Goal status:  INITIAL  PLAN:  PT FREQUENCY: 1-2x/week  PT DURATION: 8 weeks  PLANNED INTERVENTIONS: 97164- PT Re-evaluation, 97110-Therapeutic exercises,  02469- Therapeutic activity, [id number]- Neuromuscular re-education, [id number]- Self Care, 02859- Manual therapy, G0283- Electrical stimulation (unattended), [id number]-  Ultrasound, [id number]- Traction (mechanical), [phone]- Ionotophoresis 4mg /ml Dexamethasone , 20560 (1-2 muscles), 20561 (3+ muscles)- Dry Needling, Patient/Family education, Balance training, Taping, Joint mobilization, Spinal mobilization, Cryotherapy, and Moist heat  PLAN FOR NEXT SESSION: Review lumbar HEP in entirety if he is able to complete them adding RDL which is added to the phone app today, but patient will need to perform in clinic next visit.    Ardene Remley, PT 02/14/2024, 10:04 AM

## 2024-02-14 NOTE — Telephone Encounter (Signed)
 Patient has been scheduled at 4pm at 02/14/24.

## 2024-02-14 NOTE — Telephone Encounter (Signed)
 Copied from CRM [id number]. Topic: Clinical - Request for Lab/Test Order >> Feb 14, 2024 11:34 AM Devaughn RAMAN wrote: Reason for CRM: Pt returning [facility] phone call regarding when he can come into the office and pick up the home sleep study, pt stated Mae called him on Friday and he is returning her call. Pt stated he failed the last sleep study and wanted to coordinate a time for him to pick up the new one.

## 2024-02-15 NOTE — Telephone Encounter (Signed)
 Noah Lane came in on Tuesday, 12/16, to pick up the [facility] for HST. The recorded time from the [facility] was reviewed with the provider to confirm whether it was sufficient for the HST. The provider acknowledged and confirmed that the study time was adequate. Nothing further needed at this time.

## 2024-02-16 ENCOUNTER — Encounter

## 2024-02-17 ENCOUNTER — Encounter: Payer: Self-pay | Admitting: Rehabilitation

## 2024-02-17 ENCOUNTER — Ambulatory Visit: Admitting: Rehabilitation

## 2024-02-17 DIAGNOSIS — M5459 Other low back pain: Secondary | ICD-10-CM

## 2024-02-17 DIAGNOSIS — M6281 Muscle weakness (generalized): Secondary | ICD-10-CM | POA: Diagnosis not present

## 2024-02-17 DIAGNOSIS — M5412 Radiculopathy, cervical region: Secondary | ICD-10-CM

## 2024-02-17 DIAGNOSIS — R293 Abnormal posture: Secondary | ICD-10-CM

## 2024-02-17 NOTE — Therapy (Signed)
 " OUTPATIENT PHYSICAL THERAPY LUMBAR TREATMENT   Patient Name: Noah Lane MRN: [id number] DOB:1955-04-08, 68 y.o., male Today's Date: 02/17/2024  END OF SESSION:  PT End of Session - 02/17/24 0801     Visit Number 12    Date for Recertification  02/22/24    PT Start Time 0800    PT Stop Time 0845    PT Time Calculation (min) 45 min    Activity Tolerance Patient tolerated treatment well;No increased pain    Behavior During Therapy WFL for tasks assessed/performed            Past Medical History:  Diagnosis Date   Broken toe    Diabetes mellitus type 2 in obese    Mixed hyperlipidemia    Past Surgical History:  Procedure Laterality Date   COLON RESECTION N/A 04/27/2022   Procedure: LAPAROSCOPIC HAND ASSISTED RIGHT HEMI COLECTOMY, POSSIBLE OPEN;  Surgeon: Dasie Leonor CROME, MD;  Location: [facility];  Service: General;  Laterality: N/A;   COLONOSCOPY     HERNIA REPAIR  2012   Patient Active Problem List   Diagnosis Date Noted   Elevated blood pressure reading 05/04/2022   Colon adenocarcinoma (HCC) 05/04/2022   Gastroesophageal reflux disease 04/29/2022   Ileus, postoperative (HCC) 04/29/2022   Hypokalemia 04/28/2022   Hyperlipidemia 04/28/2022   Colonic obstruction (HCC) 04/25/2022   Hyponatremia 04/25/2022   OSA on CPAP 12/03/2021   Obesity (BMI 30.0-34.9) 12/03/2021   Mixed hyperlipidemia 10/20/2021   Strain of lumbar region 10/16/2021   Strain of calf muscle 07/22/2021   Patellofemoral pain syndrome of both knees 02/24/2021   Rotator cuff tendinitis, right 12/28/2019   Hip pain, bilateral 05/28/2019   Chronic bilateral low back pain without sciatica 05/28/2019   Type 2 diabetes mellitus with obesity 05/28/2019   Erectile dysfunction 05/28/2019   Pain and swelling of left knee 03/12/2019   Lateral epicondylitis, right elbow 03/15/2017   Diverticulosis of large intestine without hemorrhage 02/25/2014    PCP: Frann Mabel Mt, DO   REFERRING  PROVIDER: Dorina Loving, PA-C   REFERRING DIAG: M54.50 (ICD-10-CM) - Acute midline low back pain without sciatica  THERAPY DIAG:  Muscle weakness (generalized)  Other low back pain  Radiculopathy, cervical region  Abnormal posture  RATIONALE FOR EVALUATION AND TREATMENT: Rehabilitation  ONSET DATE: 2 weeks ago  NEXT MD VISIT:    SUBJECTIVE:  SUBJECTIVE STATEMENT: Patient reports rolled his R ankle earlier this week and has a little pain from this.  He rates the back pain 2/10  01/04/24 EVAL CERVICAL PAIN:  Patient is reevaluated today for cervical pain.  He has a new order from his PCP for neck pain evaluate/treat.   He reports the pain started several weeks ago for no apparent reason.  He states the pain was on the L side of his cervical spine and top of the L shoulder/upper trap area.   He isn't sure what precipitated it.   Denies any trauma recently.  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   He has had off/on neck pain since that time, but usually gets better.   He states he is feeling better with the neck pain this week than he did last week.   Pain level is only minimal now.   He is still working as a ecologist and admits that he has a fair amount of stress which he feels may contribute to his neck pain.   He does a lot of sitting and driving with his job.   He denies any pain, numbness, tingling into the L arm.    EVAL LBP: 68 y/o patient referred to PT for LBP.   He is well known to us  from recent episodes of PT for this issue with the latest being about 2 months ago.  Patient states 2 weeks ago he started having severe R sided LBP with any movement.   He reports the pain was severe initially, but is now doing better  with taking Celebrex .  States he can't recall any injury that would have precipitated this.  He states that he is currently having a lot of soreness now, but it is not constant.  States feels it more with sit to stand transfers.  Played golf 3-4 weeks ago, but doesn't think this aggravated him.  Doing piriformis stretching and lying flat helps him some.   He has had a back injection many years ago, but nothing since then.  No MRI.  Old xray shows DDD, facet arthropathy.    He does have a long history of off/on back pain with difficulty playing golf at times, which he likes to do.   However he states he is not going to try to play anymore this year, and is going to try to focus on exercises to improve his back pain and prevent reinjury  PAIN: Are you having pain? Yes: NPRS scale: 5/10 now and worst over last week.    Pain location: central low back now Pain description: soreness most of the time; sharp at times Aggravating factors: lifting,  Relieving factors: lying flat  PERTINENT HISTORY:  DM-II, obesity, elevated BP, GERD, B patellofemoral pain syndrome, B hip pain, h/o R RTC tendinitis, chronic B LBP w/o sciatica, scoliosis, diabetic peripheral neuropathy   PRECAUTIONS: None  RED FLAGS: None  WEIGHT BEARING RESTRICTIONS: No  FALLS:  Has patient fallen in last 6 months? No  LIVING ENVIRONMENT: Lives with: lives with their family and lives with their spouse Lives in: House/apartment Stairs: Yes: Internal: 14 steps; on left going up and External: 1 steps; unknown Has following equipment at home: Single point cane and Walker - 2 wheeled  OCCUPATION: pharmacist, hospital, mostly seated desk work and driving  PLOF: Independent  PATIENT GOALS: improve back pain and prevent further flareups/reinjuries  OBJECTIVE: (objective measures completed at initial evaluation unless otherwise dated)  DIAGNOSTIC FINDINGS:  EXAM: LUMBAR SPINE - 2-3 VIEW   COMPARISON:  None  Available.   FINDINGS: Evaluation is limited due to body habitus.   Five lumbar type vertebra. There is no acute fracture or subluxation of the lumbar spine. There is multilevel degenerative changes with disc space narrowing, endplate irregularity and spurring/osteophyte. Multilevel facet arthropathy. The visualized posterior elements appear intact. There is atherosclerotic calcification of the aorta. The soft tissues are grossly unremarkable.   IMPRESSION: No acute fracture or subluxation of the lumbar spine.     Electronically Signed   By: Vanetta Chou M.D.   On: 10/18/2021 20:41  PATIENT SURVEYS:  Modified Oswestry:  MODIFIED OSWESTRY DISABILITY SCALE 02/01/24  Date: 02/17/2024 Score   Pain intensity 2 =  Pain medication provides me with complete relief from pain. 2  2. Personal care (washing, dressing, etc.) 1 =  I can take care of myself normally, but it increases my pain. 0  3. Lifting 4 = I can lift only very light weights 3  4. Walking 1 = Pain prevents me from walking more than 1 mile. 1  5. Sitting 2 =  Pain prevents me from sitting more than 1 hour. 2  6. Standing 4 =  Pain prevents me from standing more than 10 minutes. 2  7. Sleeping 1 = I can sleep well only by using pain medication. 1  8. Social Life 1 =  My social life is normal, but it increases my level of pain. 0  9. Traveling 1 =  I can travel anywhere, but it increases my pain. 1  10. Employment/ Homemaking 2 = I can perform most of my homemaking/job duties, but pain prevents me from performing more physically stressful activities (eg, lifting, vacuuming). 1  Total 19/50 13/50 = 26%   Interpretation of scores: Score Category Description  0-20% Minimal Disability The patient can cope with most living activities. Usually no treatment is indicated apart from advice on lifting, sitting and exercise  21-40% Moderate Disability The patient experiences more pain and difficulty with sitting, lifting and  standing. Travel and social life are more difficult and they may be disabled from work. Personal care, sexual activity and sleeping are not grossly affected, and the patient can usually be managed by conservative means  41-60% Severe Disability Pain remains the main problem in this group, but activities of daily living are affected. These patients require a detailed investigation  61-80% Crippled Back pain impinges on all aspects of the patients life. Positive intervention is required  81-100% Bed-bound These patients are either bed-bound or exaggerating their symptoms  Bluford FORBES Zoe DELENA Karon DELENA, et al. Surgery versus conservative management of stable thoracolumbar fracture: the PRESTO feasibility RCT. [place] ([place]): [employer]; 2021 Nov. [employer] Assessment, No. 25.62.) Appendix 3, Oswestry Disability Index category descriptors. Available from: [email or web address].cz  Minimally Clinically Important Difference (MCID) = 12.8%   NDI:  NECK DISABILITY INDEX 02/01/24  Date: 02/17/2024 Score   Pain intensity 3 = The pain is fairly severe at the moment 1  2. Personal care (washing, dressing, etc.) 1 =  I can look after myself normally but it causes extra pain 0  3. Lifting 3 = Pain prevents me from lifting heavy weights but I can manage light to medium   weights if they are conveniently positioned 3  4. Reading 2 =  I can read as much as I want  with moderate pain in my neck 0  5. Headaches 2 =  I have moderate headaches, which come infrequently 2  6. Concentration 1 =  I can concentrate fully when I want to with slight difficulty  1  7. Work 1 =  I can only do my usual work, but no more 1  8. Driving 1 =  I can drive my car as long as I want with slight pain in my neck 1  9. Sleeping 1 = My sleep is slightly disturbed (less than 1 hr sleepless) 2  10. Recreation 3 = I am able to engage in a few of my usual recreation activities because of pain in   my  neck 3  Total 18/50 = 36% 14/50 = 28   Minimum Detectable Change (90% confidence): 5 points or 10% points   SCREENING FOR RED FLAGS: Bowel or bladder incontinence: No Spinal tumors: No Cauda equina syndrome: No Compression fracture: No Abdominal aneurysm: No  COGNITION:  Overall cognitive status: Within functional limits for tasks assessed    SENSATION: WFL  POSTURE:  Has some Leftward scoliosis present;   increased lumbar extension/sway position   Supine:  Equal pelvic landmarks;  longer LLE at the malleolus by a small amount   PALPATION: Not specifically painful;   deep palpation of lumbar paraspinals and QL are nonpainful Lumbar posterior/anterior glides and rotational glides are non painful No palpation reproduces his pain  01/04/24:  neck eval:  TTP over the C4-7 transverse processes;  no palpable trigger points throughout L neck;  P/A vertebral glides are some restricted C4-7.  Side glides and rotational glides feel fairly normal  LUMBAR ROM:   Active  Eval 02/07/24  Flexion To knees; some end range pain To knees, tightness, 50 deg on inclinometer  Extension 100% 100%; 40 deg on inclinometer  Right lateral flexion To upper thigh;  stiff feeling Upper thigh  Left lateral flexion To upper thigh; stiff feeling Less than R on upper thigh  Right rotation 75%; stiff 85%  Left rotation 100% 100%; a little more difficult per patient  (Blank rows = not tested)  CERVICAL ROM: 01/04/24  Active ROM A/PROM (deg) eval  Flexion 90%  Extension 45%  Right lateral flexion 50%  Left lateral flexion 50%  Right rotation 80%  Left rotation 75%    No motion is especially painful   MUSCLE LENGTH: Hamstrings: Right SLR = 80 deg; Left SLR = 50 deg Hamstrings: very tight L, mild tight R ITB: NT Piriformis: a little tight BLE Hip flexors: not tight Quads: NT Heelcord: NT  LOWER EXTREMITY ROM:     Active  Right eval Left eval  Hip flexion    Hip extension    Hip  abduction    Hip adduction    Hip internal rotation    Hip external rotation    Knee flexion    Knee extension    Ankle dorsiflexion    Ankle plantarflexion    Ankle inversion    Ankle eversion    (Blank rows = not tested)  LOWER EXTREMITY MMT:    MMT Right eval Left eval 02/07/24 R/L  Hip flexion 4+ 4 4+/4+  Hip extension 3+ 4- 4-/4-  Hip abduction 4- 4 4/4  Hip adduction   NT/4-  Hip internal rotation 4 4- 4+/4  Hip external rotation 4+ 4 4+/4  Knee flexion 4 4- 4+/4+  Knee extension 5 4+ 5/5  Ankle dorsiflexion 5 5 5/5  Ankle  plantarflexion 5 5   Ankle inversion     Ankle eversion      (Blank rows = not tested)  LUMBAR SPECIAL TESTS:  Straight leg raise test: Negative, Slump test: Negative, and Long sit test: Negative   Longer L malleolus but no change with long sit test;  Debby test is good flexibility, but increases his back pain for BLE (denies any SI pain)  CERVICAL SPECIAL TESTS: 01/04/24 Upper limb tension test (ULTT): Negative negative for median, radial, and ulnar nerves and unprovoked with cervical sidebending or rotation or both   FUNCTIONAL TESTS:  TBD if needed  GAIT: Distance walked: into clinic x 200' Assistive device utilized: None Level of assistance: Complete Independence Gait pattern: significant toe out BLE nearly contacting his other heel on swing phase of gait Comments: no limping noted   TODAY'S TREATMENT:  02/17/24 THERAPEUTIC EXERCISE: To improve strength and endurance.  Demonstration, verbal and tactile cues throughout for technique. Elliptical L0 3' F, 3' B   NEUROMUSCULAR RE-EDUCATION: To improve reeducation of movement and isolating gluteus maximum w/ exercises rather than quads. Running man w/ 5# KB x 2/8 BLE Deep back squats x 8 BLE--teaching still required for correct amount of hip flexion with nose ahead of toes RDL holding counter x 2/10 BLE  THERAPEUTIC ACTIVITIES: To improve functional performance.  Demonstration, verbal  and tactile cues throughout for technique. Seated hamstring stretch x 1' x 4 BLE interspersed throughout the workout SL hip abd x 2/8 BLE Prone plank w/ 3 sec holds x 10 Resisted sidestepping black TB at knees x 20' x 4 Monster walks black TB at knees 20' x 4  02/14/24 THERAPEUTIC EXERCISE: To improve strength and endurance.  Demonstration, verbal and tactile cues throughout for technique. Elliptical L0 3' F, 3' B  NEUROMUSCULAR RE-EDUCATION: To improve reeducation of movement and isolating gluteus maximum w/ exercises rather than quads. Running man w/ 10# KB x 3/8 BLE Deep back squats x 8 BLE--teaching still required for correct amount of hip flexion with nose ahead of toes Long stride lunge on 8 step w/ leading leg x 2/8;   better form today  THERAPEUTIC ACTIVITIES: To improve functional performance.  Demonstration, verbal and tactile cues throughout for technique. Seated hamstring stretch x 1' x 4 BLE interspersed throughout the workout Standing piriformis stretch x 1' x 2 BLE interspersed throughout the workout Standing quad stretch x 1' x 2 BLE interspersed throughout the workout Trunk Sidebending stretch sliding arm up wall and lean away x 10 sec x 5 each direction--patient advised mainly to do this w/ RUE only  at home Resisted sidestepping black TB at knees x 20' x 4 Monster walks black TB at knees 20' x 4    02/10/24 THERAPEUTIC EXERCISE: To improve strength and endurance.  Demonstration, verbal and tactile cues throughout for technique. Elliptical L0 3' F, 3' B  NEUROMUSCULAR RE-EDUCATION: To improve reeducation of movement and isolating gluteus maximum w/ exercises rather than quads. Running man w/ 10# KB x 3/8 BLE Deep back squats x 8 BLE--vc required for hip hinge and form Long stride lunge on 8 step w/ leading leg x 2/8;   lots of teaching for form, posture, foot placement Standing pallof press GTB x 2/10 BUE  THERAPEUTIC ACTIVITIES: To improve functional  performance.  Demonstration, verbal and tactile cues throughout for technique. Seated hamstring stretch x 1' x 4 BLE interspersed throughout the workout Standing hip flexor stretch x 1' x 2 BLE interspersed throughout the workout Standing  quad stretch x 1' x 2 BLE interspersed throughout the workout Trunk Sidebending stretch sliding arm up wall and lean away x 10 sec x 5 each direction--patient advised mainly to do this w/ RUE only at home  Rechecked leg length and they are actually equal in supine measuring 39 cm from ASIS to distal medial malleolus.   ASIS are also level  02/07/24 THERAPEUTIC EXERCISE: To improve strength and endurance.  Demonstration, verbal and tactile cues throughout for technique. Elliptical L0 4' F, 4' B  THERAPEUTIC ACTIVITIES: To improve functional performance.  Demonstration, verbal and tactile cues throughout for technique. Seated hamstring stretch x 1' x 2 BLE Seated hamstring stretch w/ leg propped on bed x 1' x 2 BLE Running man exercise with BUE support (no weight) x 2/8 Seated overhead reach lumbar sidebending stretch x 1' x 2 each way Rechecked strength Rechecked lumbar ROM  NEUROMUSCULAR RE-EDUCATION: To improve reeducation of movement and isolating gluteus maximum w/ exercises rather than quads.  Sidestepping between 8 stools w/ 15# KB lift x 8 laps Deep forward lunge w/ contralateral toe slides to neutral F/B to isolate Gmax x 2/8--manual facilitation required for form Deep back squats x 8 BLE--vc required for hip hinge and form   02/01/24 THERAPEUTIC EXERCISE: To improve strength and endurance.  Demonstration, verbal and tactile cues throughout for technique. Elliptical L2 x 2' F; L0 x 2' B  THERAPEUTIC ACTIVITIES: To improve functional performance.  Demonstration, verbal and tactile cues throughout for technique. Supine SLR ham stretch x 1' x 3 BLE Supine curl up and reach x 20 straight;  x 10 diagonals Prone full planks x 3 sec holds x 10  SL  hip abd w/ PPT x 2/10 LLE Trunk Sidebending stretch sliding arm up wall and lean away x 1' x 2 each direction Seated hamstring stretch x 1' x 2  NEUROMUSCULAR RE-EDUCATION: To improve coordination, kinesthesia, posture, and proprioception. Tall kneel on foam w/ doubled blue TB pallof press x 15 each direction Trunk rotation and press x 15 each direction  Chop down stabilization x 15 each direction BUE  01/17/24 NEUROMUSCULAR RE-EDUCATION: To improve coordination, kinesthesia, posture, and proprioception. Foam roll lying:  Shoulder ER w/ cerv retract w/ RTB x 20 BUE  Shoulder HABD w/ cerv retract RTB x 20 BUE  Snow angels w/ cerv retract x 20 BUE  Scapular depression w/ cerv retraction x 20 BUE  D2 PNF extension YTB w/ cerv retraction x 20 BUE  90/90 shoulder ER 1# x 20 BUE  Horizontal foam roll upper thoracic mobilizations x 2/10 hands behind head   MANUAL THERAPY: To promote improved joint mobility and reduced pain utilizing joint mobilization and myofascial release. Grade 3-4 jt mobilizations to C3-7 for P/A glides and rotations R x 20 reps for C6-7;    suboccipital release  01/13/24 THERAPEUTIC EXERCISE: To improve strength and endurance.  Demonstration, verbal and tactile cues throughout for technique. Elliptical L0 x 6' F  THERAPEUTIC ACTIVITIES: To improve functional performance.  Demonstration, verbal and tactile cues throughout for technique. Seated hamstring stretch x 1'  Seated FABER piriformis x 1' BLE SL hip abd x 20 BLE Prone plank x 10 SL plank from knees x 10 B Ab curl up/reach x 2/10  NEUROMUSCULAR RE-EDUCATION: To improve coordination, kinesthesia, posture, and proprioception. Seated 75 swiss ball Palloff press while holding knee extended blue TB x 2/10 BLE each direction Supine bridge w/ palloff press x 2/10 BUE  MANUAL THERAPY: To promote reduced pain utilizing  percussion massage with massage gun. R lumbar paraspinals and quadratus lumborum and  posterolateral R hip percussion massage for pain relief  01/11/24 THERAPEUTIC EXERCISE: To improve strength and endurance.  Demonstration, verbal and tactile cues throughout for technique. Elliptical L0 x 5' F  THERAPEUTIC ACTIVITIES: To improve functional performance.  Demonstration, verbal and tactile cues throughout for technique. Supine foam roll:  Cervical and thoracic retraction x 20  Scapular depression x 20  Shoulder ER RTB w/ cerv retraction x 20  Shoulder HABD RTB w/ cerv retract x 20  90/90 shoulder ER x 20 BUE  Hands behind head isometric HABD x 20  Towel cervical extension glides x 8 C3-7 Seated towel rotational SNAGs x 10 to L;  x 10 to R at 3 different towel positions on the neck to encompass all vertebrae Pool noodle under neck cervical rotation x 20 each direction for MFR effects  01/06/24 THERAPEUTIC EXERCISE: To improve strength and endurance.  Demonstration, verbal and tactile cues throughout for technique. Elliptical L0 x 6' F  THERAPEUTIC ACTIVITIES: To improve functional performance.  Demonstration, verbal and tactile cues throughout for technique. Seated hamstring stretch x 1'  Seated FABER piriformis x 1' BLE Seated cross over piriformis x 1' BLE SL hip abd x 30 BLE Prone plank x 10 SL plank from knees x 10 B  NEUROMUSCULAR RE-EDUCATION: To improve coordination, kinesthesia, posture, and proprioception. Seated 75 swiss ball Palloff press while holding knee extended blue TB x 2/10 BLE each direction Quadruped fire hydrant w/ TA and lateral YTB pull from PT x 2/10 BLE each direction  MANUAL THERAPY: To promote reduced pain utilizing percussion massage with massage gun. R lumbar paraspinals and quadratus lumborum  percussion massage for pain relief  01/04/24 PT reevaluation/evaluation for cervical spine pain (see notations above and below for subjective notes, objective measures, assessment and goals for the cervical spine)  SELF CARE: Provided education  on PT POC progression and initial HEP for cervical spine (see below for detail).   12/28/23 SELF CARE: Provided education on PT POC progression.   PATIENT EDUCATION:  Education details: adding running man and lunge w/ back leg toe slides forward for gluteus maximum strength  Person educated: Patient Education method: Explanation, Demonstration, Verbal cues, Tactile cues, Handouts, and [email or web address] app access provided Education comprehension: verbalized understanding, verbal cues required, tactile cues required, and needs further education  HOME EXERCISE PROGRAM: Access Code: [email or web address] URL: [email or web address] Date: 02/17/2024 Prepared by: Garnette Montclair  Exercises - Seated Anti-Rotation Press With Anchored Resistance  - 1 x daily - 7 x weekly - 3 sets - 10 reps - Seated Cervical Sidebending Stretch  - 1 x daily - 7 x weekly - 1 sets - 2 reps - 1 min hold - Mid-Lower Cervical Extension SNAG with Strap  - 1 x daily - 7 x weekly - 1 sets - 10 reps - Supine Cervical Retraction with Towel  - 1 x daily - 7 x weekly - 1 sets - 10 reps - 3 sec hold - Supine Scapular Retraction  - 1 x daily - 7 x weekly - 1 sets - 10 reps - 3 sec hold - Standing Scapular Depression  - 1 x daily - 7 x weekly - 3 sets - 10 reps - Supine Chest Stretch with Elbows Bent  - 1 x daily - 7 x weekly - 1 sets - 10 reps - 3 sec hold - Supine Shoulder External Rotation on Foam Roll with Theraband  - 1 x daily -  7 x weekly - 3 sets - 10 reps - Hooklying Shoulder T  - 1 x daily - 7 x weekly - 3 sets - 10 reps - Supine Shoulder External and Internal Rotation in Abduction with Dumbbell  - 1 x daily - 7 x weekly - 3 sets - 10 reps - Seated Assisted Cervical Rotation with Towel  - 1 x daily - 7 x weekly - 1 sets - 10 reps - Supine PNF D2 Flexion with Resistance  - 1 x daily - 7 x weekly - 3 sets - 10 reps - Snow Angels on Foam Roll  - 1 x daily - 7 x weekly - 3 sets - 10 reps - Single Leg Lunge with Foot on  Bench  - 1 x daily - 7 x weekly - 2 sets - 8 reps - Squat with Chair Touch and Resistance Loop  - 1 x daily - 7 x weekly - 2 sets - 8 reps - Seated Sidebending Arms Overhead  - 1 x daily - 7 x weekly - 1 sets - 2 reps - 1 min hold - Single-Leg Romanian Deadlift With Kettlebell  - 1 x daily - 7 x weekly - 3 sets - 10 reps - Side Stepping with Resistance at Thighs  - 1 x daily - 7 x weekly - 3 sets - 10 reps - Forward Monster Walk with Resistance at Ankles and Counter Support  - 1 x daily - 7 x weekly - 3 sets - 10 reps - Standard Plank  - 1 x daily - 7 x weekly - 1 sets - 10 reps - Prone Hip Extension  - 1 x daily - 7 x weekly - 3 sets - 10 reps - Side Plank on Knees  - 1 x daily - 7 x weekly - 1 sets - 10 reps - Sidelying Hip Abduction  - 1 x daily - 7 x weekly - 3 sets - 10 reps - Seated Hamstring Stretch  - 1 x daily - 7 x weekly - 1 sets - 2 reps - 1 min hold  ASSESSMENT:  CLINICAL IMPRESSION: Patient is doing well with his home exercises.   Reviewed most of his home program for the lumbar spine pain.   He is able to complete them with good return demonstration and only occasional cues for form correction.  He has 1 more PT visit with D/C planned after the next time.   He will need to continue to do his HEP regularly after completion of therapy,  and we continue to re emphasize the importance of this.    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  The flaring up and improvement cycle is also somewhat consistent with OA pain.   He exhibits considerable cervical spine stiffness with looking up and likely has some foraminal crowding.  He has no recent imaging for the neck.   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  No crepitus is felt with cervical ROM.   He has decreased posterior/anterior vertebral glide mobility from C4-7.  There are no palpable trigger points and nerve  tension tests are negative for the LUE.  He has no radicular symptoms in the LUE.   He demonstrates forward head posture with sloped shoulders and upper thoracic kyphosis.   Noah Lane would benefit from PT to address  cervical ROM/stiffness, posture, and pain deficits.    He is agreeable to the POC  BACK EVAL:  Noah Lane is a 68 y.o. male who was referred to physical therapy for evaluation and treatment for acute on chronic LBP   Patient reports onset of  pain beginning 2 weeks ago for no apparent reason.   However, he saw PCP and started celebrex  and is feeling better.    Pain is worse with sit to stand transfers and bending over/lifting and sitting or standing prolonged periods of time.  He spends a lot of time in the car and at his desk.  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  He has a slightly longer LLE and a little leftward leaning sway back posture.  Patient has deficits in lumbar ROM, hamstring LE flexibility, BLE strength, abnormal posture with some leftward lean, and residual pain which are interfering with ADLs and are impacting quality of life.  On Modified Oswestry patient scored 19/50 demonstrating 38% or moderate disability.  Noah Lane will benefit from skilled PT to address above deficits to improve mobility and activity tolerance with decreased pain interference.   OF note, he is also having neck pain and is going to speak with PCP about a referral to address the cervical spine as well.  We will look for this order  OBJECTIVE IMPAIRMENTS: difficulty walking, decreased ROM, decreased strength, postural dysfunction, and pain.   ACTIVITY LIMITATIONS: lifting, bending, sitting, and standing  PARTICIPATION LIMITATIONS: cleaning, laundry, driving, and community activity  PERSONAL FACTORS: Age and 1-2 comorbidities: DM-II, obesity, elevated BP, GERD, B patellofemoral pain syndrome, B hip pain, h/o R RTC tendinitis, chronic B LBP w/o sciatica, scoliosis, diabetic peripheral neuropathy are  also affecting patient's functional outcome.   REHAB POTENTIAL: Good  CLINICAL DECISION MAKING: Evolving/moderate complexity  EVALUATION COMPLEXITY: Moderate   GOALS: Goals reviewed with patient? Yes  SHORT TERM GOALS: Target date: 01/25/2024   Patient will be independent with initial HEP to improve outcomes and carryover.  Baseline: 100% PT assist required for correct completion 01/04/24:  initial HEP for cervical pain initiated today 11/12:  progressing with initial HEP for cervical spine today Goal status: MET   2.  Patient will report 25% improvement in low back pain to improve QOL. Baseline: 5/10 Goal status: INITIAL  3.  Patient will report 50% subjective improvement in L sided cervical pain   Baseline:  11/12:  2/10  Goal status:  INITIAL on 01/04/24  LONG TERM GOALS: Target date: 02/22/2024   Patient will be independent with ongoing/advanced HEP for self-management at home.  Baseline: no advanced HEP yet 01/11/24:  advancing Goal status: IN PROGRESS  2.  Patient will report 50-75% improvement in low back pain to improve QOL.  Baseline: 5/10 02/01/24:  2/10 02/07/24:  4/10 today 02/14/24:  2/10 Goal status: IN PROGRESS  3.  Patient to demonstrate ability to achieve and maintain good spinal alignment/posturing and body mechanics needed for daily activities. Baseline: leftward lean with sway back Goal status: IN PROGRESS  4.  Patient will demonstrate full pain free lumbar ROM to perform ADLs.   Baseline: Refer to above lumbar ROM table 02/07/24:  see updated tables Goal status: IN PROGRESS  5.  Patient will demonstrate improved BLE strength to >/= 5/5 for improved stability and ease of mobility. Baseline: Refer to above LE MMT table 02/07/24:  see updated tables Goal status: IN PROGRESS  6. Patient will report </= 20% on Modified Oswestry (MCID = 12%) to  demonstrate improved functional ability with decreased pain interference. Baseline: 38% 02/01/24:   26% Goal status: IN PROGRESS  7.  Patient will tolerate 30 min of (standing/sitting/walking) w/o increased pain to allow for  improved mobility and activity tolerance. Baseline: states pain begins after 10 min standing/walking Goal status: INITIAL   8.  Patient will demonstrate 60-80% cervical extension to be able to look up to do work around the house     Baseline:  see ROM tables above   Goal status:  INITIAL       9.  Patient will report >/= 75% improvement in neck pain    01/11/24:  2/10 02/10/24: 0/10 Goal status:  MET  10.  New Goal 02/01/24:  NDI score will improve to >/= 20% to reflect improved activity tolerance and QOL  Baseline:28%   Goal status:  INITIAL  PLAN:  PT FREQUENCY: 1-2x/week  PT DURATION: 8 weeks  PLANNED INTERVENTIONS: 97164- PT Re-evaluation, 97110-Therapeutic exercises, 97530- Therapeutic activity, 97112- Neuromuscular re-education, 97535- Self Care, 02859- Manual therapy, G0283- Electrical stimulation (unattended), 97035- Ultrasound, 02987- Traction (mechanical), [id number]- Ionotophoresis 4mg /ml Dexamethasone , 79439 (1-2 muscles), 20561 (3+ muscles)- Dry Needling, Patient/Family education, Balance training, Taping, Joint mobilization, Spinal mobilization, Cryotherapy, and Moist heat  PLAN FOR NEXT SESSION:  Patient has one more scheduled visit and will be ready to D/C to home exercise program.     Briyanna Billingham, PT 02/17/2024, 9:52 AM  "

## 2024-02-19 ENCOUNTER — Telehealth: Payer: Self-pay | Admitting: Pulmonary Disease

## 2024-02-19 DIAGNOSIS — G4733 Obstructive sleep apnea (adult) (pediatric): Secondary | ICD-10-CM | POA: Diagnosis not present

## 2024-02-19 NOTE — Telephone Encounter (Signed)
 Call patient  Sleep study result  Date of study: 02/14/2024  Impression: Moderate obstructive sleep apnea with AHI of 20.7, O2 nadir of 84%. Poor sleep efficiency, total sleep recording was 3 hours 22 minutes.  Optimal sleep duration for a home sleep test is 4 hours.  Recommendation: Recommend CPAP treatment for moderate obstructive sleep apnea Correlate study findings with the fact that the patient already has sleep apnea and did not use CPAP for the study; this may have contributed to poor sleep quantity during the recording. Auto CPAP 5-15 with heated humidification with the patient's mask of choice Avoid alcohol, sedatives, and other CNS depressants that may worsen sleep apnea and disrupt normal sleep architecture. Sleep hygiene should be reviewed to assess factors that may improve sleep quality. Weight management and regular exercise should be initiated or continued.

## 2024-02-20 NOTE — Telephone Encounter (Signed)
 Reached out to Mr. Kayla and provided an update that his [employer] has been interpreted and signed bu the provider, scanned into the chart, and sent to the DME company, [employer]. [employer] confirmed they will be contacting Mr. Padraig directly. Patient expressed appreciation for the assistance in getting this completed prior to the new year. Provided [employer]'s contact number, patient acknowledged and verbalized understanding, no further action needed at this time.

## 2024-02-21 ENCOUNTER — Ambulatory Visit: Admitting: Rehabilitation

## 2024-02-21 ENCOUNTER — Encounter: Payer: Self-pay | Admitting: Rehabilitation

## 2024-02-21 DIAGNOSIS — M6281 Muscle weakness (generalized): Secondary | ICD-10-CM

## 2024-02-21 DIAGNOSIS — R293 Abnormal posture: Secondary | ICD-10-CM

## 2024-02-21 DIAGNOSIS — M5412 Radiculopathy, cervical region: Secondary | ICD-10-CM

## 2024-02-21 DIAGNOSIS — M5459 Other low back pain: Secondary | ICD-10-CM

## 2024-02-21 NOTE — Therapy (Signed)
 " OUTPATIENT PHYSICAL THERAPY LUMBAR TREATMENT / DC SUMMARY   Patient Name: Noah Lane MRN: [id number] DOB:12-20-1955, 68 y.o., male Today's Date: 02/21/2024  END OF SESSION:  PT End of Session - 02/21/24 0802     Visit Number 13    Date for Recertification  02/22/24    PT Start Time 0800    PT Stop Time 0845    PT Time Calculation (min) 45 min    Activity Tolerance Patient tolerated treatment well;No increased pain    Behavior During Therapy WFL for tasks assessed/performed            Past Medical History:  Diagnosis Date   Broken toe    Diabetes mellitus type 2 in obese    Mixed hyperlipidemia    Past Surgical History:  Procedure Laterality Date   COLON RESECTION N/A 04/27/2022   Procedure: LAPAROSCOPIC HAND ASSISTED RIGHT HEMI COLECTOMY, POSSIBLE OPEN;  Surgeon: Dasie Leonor CROME, MD;  Location: [facility];  Service: General;  Laterality: N/A;   COLONOSCOPY     HERNIA REPAIR  2012   Patient Active Problem List   Diagnosis Date Noted   Elevated blood pressure reading 05/04/2022   Colon adenocarcinoma (HCC) 05/04/2022   Gastroesophageal reflux disease 04/29/2022   Ileus, postoperative (HCC) 04/29/2022   Hypokalemia 04/28/2022   Hyperlipidemia 04/28/2022   Colonic obstruction (HCC) 04/25/2022   Hyponatremia 04/25/2022   OSA on CPAP 12/03/2021   Obesity (BMI 30.0-34.9) 12/03/2021   Mixed hyperlipidemia 10/20/2021   Strain of lumbar region 10/16/2021   Strain of calf muscle 07/22/2021   Patellofemoral pain syndrome of both knees 02/24/2021   Rotator cuff tendinitis, right 12/28/2019   Hip pain, bilateral 05/28/2019   Chronic bilateral low back pain without sciatica 05/28/2019   Type 2 diabetes mellitus with obesity 05/28/2019   Erectile dysfunction 05/28/2019   Pain and swelling of left knee 03/12/2019   Lateral epicondylitis, right elbow 03/15/2017   Diverticulosis of large intestine without hemorrhage 02/25/2014    PCP: Frann Mabel Mt, DO    REFERRING PROVIDER: Dorina Loving, PA-C   REFERRING DIAG: M54.50 (ICD-10-CM) - Acute midline low back pain without sciatica  THERAPY DIAG:  Muscle weakness (generalized)  Other low back pain  Radiculopathy, cervical region  Abnormal posture  RATIONALE FOR EVALUATION AND TREATMENT: Rehabilitation  ONSET DATE: 2 weeks ago  NEXT MD VISIT:    SUBJECTIVE:  SUBJECTIVE STATEMENT: Patient reports did a lot of stretching yesterday and feels better today.  01/04/24 EVAL CERVICAL PAIN:  Patient is reevaluated today for cervical pain.  He has a new order from his PCP for neck pain evaluate/treat.   He reports the pain started several weeks ago for no apparent reason.  He states the pain was on the L side of his cervical spine and top of the L shoulder/upper trap area.   He isn't sure what precipitated it.   Denies any trauma recently.  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   He has had off/on neck pain since that time, but usually gets better.   He states he is feeling better with the neck pain this week than he did last week.   Pain level is only minimal now.   He is still working as a ecologist and admits that he has a fair amount of stress which he feels may contribute to his neck pain.   He does a lot of sitting and driving with his job.   He denies any pain, numbness, tingling into the L arm.    EVAL LBP: 68 y/o patient referred to PT for LBP.   He is well known to us  from recent episodes of PT for this issue with the latest being about 2 months ago.  Patient states 2 weeks ago he started having severe R sided LBP with any movement.   He reports the pain was severe initially, but is now doing better with taking Celebrex .  States  he can't recall any injury that would have precipitated this.  He states that he is currently having a lot of soreness now, but it is not constant.  States feels it more with sit to stand transfers.  Played golf 3-4 weeks ago, but doesn't think this aggravated him.  Doing piriformis stretching and lying flat helps him some.   He has had a back injection many years ago, but nothing since then.  No MRI.  Old xray shows DDD, facet arthropathy.    He does have a long history of off/on back pain with difficulty playing golf at times, which he likes to do.   However he states he is not going to try to play anymore this year, and is going to try to focus on exercises to improve his back pain and prevent reinjury  PAIN: Are you having pain? Yes: NPRS scale: 5/10 now and worst over last week.    Pain location: central low back now Pain description: soreness most of the time; sharp at times Aggravating factors: lifting,  Relieving factors: lying flat  PERTINENT HISTORY:  DM-II, obesity, elevated BP, GERD, B patellofemoral pain syndrome, B hip pain, h/o R RTC tendinitis, chronic B LBP w/o sciatica, scoliosis, diabetic peripheral neuropathy   PRECAUTIONS: None  RED FLAGS: None  WEIGHT BEARING RESTRICTIONS: No  FALLS:  Has patient fallen in last 6 months? No  LIVING ENVIRONMENT: Lives with: lives with their family and lives with their spouse Lives in: House/apartment Stairs: Yes: Internal: 14 steps; on left going up and External: 1 steps; unknown Has following equipment at home: Single point cane and Walker - 2 wheeled  OCCUPATION: pharmacist, hospital, mostly seated desk work and driving  PLOF: Independent  PATIENT GOALS: improve back pain and prevent further flareups/reinjuries   OBJECTIVE: (objective measures completed at initial evaluation unless otherwise dated)  DIAGNOSTIC FINDINGS:  EXAM: LUMBAR SPINE - 2-3 VIEW   COMPARISON:  None Available.   FINDINGS: Evaluation is  limited due to body habitus.   Five lumbar type vertebra. There is no acute fracture or subluxation of the lumbar spine. There is multilevel degenerative changes with disc space narrowing, endplate irregularity and spurring/osteophyte. Multilevel facet arthropathy. The visualized posterior elements appear intact. There is atherosclerotic calcification of the aorta. The soft tissues are grossly unremarkable.   IMPRESSION: No acute fracture or subluxation of the lumbar spine.     Electronically Signed   By: Vanetta Chou M.D.   On: 10/18/2021 20:41  PATIENT SURVEYS:  Modified Oswestry:  MODIFIED OSWESTRY DISABILITY SCALE 02/01/24 02/21/24  Date: 02/21/2024 Score    Pain intensity 2 =  Pain medication provides me with complete relief from pain. 2 1  2. Personal care (washing, dressing, etc.) 1 =  I can take care of myself normally, but it increases my pain. 0 1  3. Lifting 4 = I can lift only very light weights 3 3  4. Walking 1 = Pain prevents me from walking more than 1 mile. 1 1  5. Sitting 2 =  Pain prevents me from sitting more than 1 hour. 2 2  6. Standing 4 =  Pain prevents me from standing more than 10 minutes. 2 3  7. Sleeping 1 = I can sleep well only by using pain medication. 1 1  8. Social Life 1 =  My social life is normal, but it increases my level of pain. 0 1  9. Traveling 1 =  I can travel anywhere, but it increases my pain. 1 2  10. Employment/ Homemaking 2 = I can perform most of my homemaking/job duties, but pain prevents me from performing more physically stressful activities (eg, lifting, vacuuming). 1 1  Total 19/50 13/50 = 26% 16/50 = 32%   Interpretation of scores: Score Category Description  0-20% Minimal Disability The patient can cope with most living activities. Usually no treatment is indicated apart from advice on lifting, sitting and exercise  21-40% Moderate Disability The patient experiences more pain and difficulty with sitting, lifting and  standing. Travel and social life are more difficult and they may be disabled from work. Personal care, sexual activity and sleeping are not grossly affected, and the patient can usually be managed by conservative means  41-60% Severe Disability Pain remains the main problem in this group, but activities of daily living are affected. These patients require a detailed investigation  61-80% Crippled Back pain impinges on all aspects of the patients life. Positive intervention is required  81-100% Bed-bound These patients are either bed-bound or exaggerating their symptoms  Bluford FORBES Zoe DELENA Karon DELENA, et al. Surgery versus conservative management of stable thoracolumbar fracture: the PRESTO feasibility RCT. [place] ([place]): [employer]; 2021 Nov. [facility] Technology Assessment, No. 25.62.) Appendix 3, Oswestry Disability Index category descriptors. Available from: Findjewelers.cz  Minimally Clinically Important Difference (MCID) = 12.8%   NDI:  NECK DISABILITY INDEX 02/01/24 02/21/24  Date: 02/21/2024 Score    Pain intensity 3 = The pain is fairly severe at the moment 1 0  2. Personal care (washing, dressing, etc.) 1 =  I can look after myself normally but it causes extra pain 0 0  3. Lifting 3 = Pain prevents me from lifting heavy weights but I can manage light to medium   weights if they are conveniently positioned 3 3  4. Reading 2 =  I can read as much as I want with moderate pain in my neck 0 2  5. Headaches 2 =  I have moderate headaches, which come infrequently 2 2  6. Concentration 1 =  I can concentrate fully when I want to with slight difficulty  1 0  7. Work 1 =  I can only do my usual work, but no more 1 0  8. Driving 1 =  I can drive my car as long as I want with slight pain in my neck 1 1  9. Sleeping 1 = My sleep is slightly disturbed (less than 1 hr sleepless) 2 1  10. Recreation 3 = I am able to engage in a few of my usual recreation  activities because of pain in   my neck 3 2  Total 18/50 = 36% 14/50 = 28 11/50 = 22%   Minimum Detectable Change (90% confidence): 5 points or 10% points   SCREENING FOR RED FLAGS: Bowel or bladder incontinence: No Spinal tumors: No Cauda equina syndrome: No Compression fracture: No Abdominal aneurysm: No  COGNITION:  Overall cognitive status: Within functional limits for tasks assessed    SENSATION: WFL  POSTURE:  Has some Leftward scoliosis present;   increased lumbar extension/sway position   Supine:  Equal pelvic landmarks;  longer LLE at the malleolus by a small amount   PALPATION: Not specifically painful;   deep palpation of lumbar paraspinals and QL are nonpainful Lumbar posterior/anterior glides and rotational glides are non painful No palpation reproduces his pain  01/04/24:  neck eval:  TTP over the C4-7 transverse processes;  no palpable trigger points throughout L neck;  P/A vertebral glides are some restricted C4-7.  Side glides and rotational glides feel fairly normal  LUMBAR ROM:   Active  Eval 02/07/24 12/*23/25  Flexion To knees; some end range pain To knees, tightness, 50 deg on inclinometer To knees  Extension 100% 100%; 40 deg on inclinometer 100%  Right lateral flexion To upper thigh;  stiff feeling Upper thigh Almost to knee  Left lateral flexion To upper thigh; stiff feeling Less than R on upper thigh Mid thigh  Right rotation 75%; stiff 85% 85%  Left rotation 100% 100%; a little more difficult per patient 100%  (Blank rows = not tested)  CERVICAL ROM: 01/04/24  Active ROM A/PROM (deg) eval  Flexion 90%  Extension 45%  Right lateral flexion 50%  Left lateral flexion 50%  Right rotation 80%  Left rotation 75%    No motion is especially painful   MUSCLE LENGTH: Hamstrings: Right SLR = 80 deg; Left SLR = 50 deg Hamstrings: very tight L, mild tight R ITB: NT Piriformis: a little tight BLE Hip flexors: not tight Quads: NT Heelcord:  NT  LOWER EXTREMITY ROM:     Active  Right eval Left eval  Hip flexion    Hip extension    Hip abduction    Hip adduction    Hip internal rotation    Hip external rotation    Knee flexion    Knee extension    Ankle dorsiflexion    Ankle plantarflexion    Ankle inversion    Ankle eversion    (Blank rows = not tested)  LOWER EXTREMITY MMT:    MMT Right eval Left eval 02/07/24 R/L 02/21/24 R/L  Hip flexion 4+ 4 4+/4+ 4+/4+  Hip extension 3+ 4- 4-/4- 4-/4-  Hip abduction 4- 4 4/4 4/4  Hip adduction   NT/4-  Hip internal rotation 4 4- 4+/4 4/4  Hip external rotation 4+ 4 4+/4 5/4  Knee flexion 4 4- 4+/4+ 5/5  Knee extension 5 4+ 5/5 5/5  Ankle dorsiflexion 5 5 5/5 5/5  Ankle plantarflexion 5 5    Ankle inversion      Ankle eversion       (Blank rows = not tested)  LUMBAR SPECIAL TESTS:  Straight leg raise test: Negative, Slump test: Negative, and Long sit test: Negative   Longer L malleolus but no change with long sit test;  Debby test is good flexibility, but increases his back pain for BLE (denies any SI pain)  CERVICAL SPECIAL TESTS: 01/04/24 Upper limb tension test (ULTT): Negative negative for median, radial, and ulnar nerves and unprovoked with cervical sidebending or rotation or both   FUNCTIONAL TESTS:  TBD if needed  GAIT: Distance walked: into clinic x 200' Assistive device utilized: None Level of assistance: Complete Independence Gait pattern: significant toe out BLE nearly contacting his other heel on swing phase of gait Comments: no limping noted   TODAY'S TREATMENT:  02/21/24 THERAPEUTIC EXERCISE: To improve strength and endurance.  Demonstration, verbal and tactile cues throughout for technique. Elliptical L0 3' F, 3' B  THERAPEUTIC ACTIVITIES: To improve functional performance.  Demonstration, verbal and tactile cues throughout for technique. Prone hip ext x 20 BLE Prone hip abd x 20 BLE Prone plank w/ 3 sec holds x 10 R side plank x 10 R  pelvic sideglides x 10 w/ 3 sec holds Seated hamstring stretch x 1' x 3 BLE Lunge forward on 8 step x 10 BLE  Reverse lunge on slider x 10 BLE Rechecked strength Rechecked lumbar ROM and hamstring length  SLR RLE = 75 deg;  SLR LLE = 50 deg   02/17/24 THERAPEUTIC EXERCISE: To improve strength and endurance.  Demonstration, verbal and tactile cues throughout for technique. Elliptical L0 3' F, 3' B   NEUROMUSCULAR RE-EDUCATION: To improve reeducation of movement and isolating gluteus maximum w/ exercises rather than quads. Running man w/ 5# KB x 2/8 BLE Deep back squats x 8 BLE--teaching still required for correct amount of hip flexion with nose ahead of toes RDL holding counter x 2/10 BLE  THERAPEUTIC ACTIVITIES: To improve functional performance.  Demonstration, verbal and tactile cues throughout for technique. Seated hamstring stretch x 1' x 4 BLE interspersed throughout the workout SL hip abd x 2/8 BLE Prone plank w/ 3 sec holds x 10 Resisted sidestepping black TB at knees x 20' x 4 Monster walks black TB at knees 20' x 4   PATIENT EDUCATION:  Education details: adding running man and lunge w/ back leg toe slides forward for gluteus maximum strength  Person educated: Patient Education method: Explanation, Demonstration, Verbal cues, Tactile cues, Handouts, and MedBridgeGO app access provided Education comprehension: verbalized understanding, verbal cues required, tactile cues required, and needs further education  HOME EXERCISE PROGRAM: Access Code: [email or web address] URL: [email or web address] Date: 02/17/2024 Prepared by: Garnette Montclair  Exercises - Seated Anti-Rotation Press With Anchored Resistance  - 1 x daily - 7 x weekly - 3 sets - 10 reps - Seated Cervical Sidebending Stretch  - 1 x daily - 7 x weekly - 1 sets - 2 reps - 1 min hold - Mid-Lower Cervical Extension SNAG with Strap  - 1 x daily - 7 x weekly - 1 sets - 10 reps - Supine Cervical Retraction with  Towel  - 1 x daily - 7 x weekly - 1  sets - 10 reps - 3 sec hold - Supine Scapular Retraction  - 1 x daily - 7 x weekly - 1 sets - 10 reps - 3 sec hold - Standing Scapular Depression  - 1 x daily - 7 x weekly - 3 sets - 10 reps - Supine Chest Stretch with Elbows Bent  - 1 x daily - 7 x weekly - 1 sets - 10 reps - 3 sec hold - Supine Shoulder External Rotation on Foam Roll with Theraband  - 1 x daily - 7 x weekly - 3 sets - 10 reps - Hooklying Shoulder T  - 1 x daily - 7 x weekly - 3 sets - 10 reps - Supine Shoulder External and Internal Rotation in Abduction with Dumbbell  - 1 x daily - 7 x weekly - 3 sets - 10 reps - Seated Assisted Cervical Rotation with Towel  - 1 x daily - 7 x weekly - 1 sets - 10 reps - Supine PNF D2 Flexion with Resistance  - 1 x daily - 7 x weekly - 3 sets - 10 reps - [place] on Foam Roll  - 1 x daily - 7 x weekly - 3 sets - 10 reps - Single Leg Lunge with Foot on Bench  - 1 x daily - 7 x weekly - 2 sets - 8 reps - Squat with Chair Touch and Resistance Loop  - 1 x daily - 7 x weekly - 2 sets - 8 reps - Seated Sidebending Arms Overhead  - 1 x daily - 7 x weekly - 1 sets - 2 reps - 1 min hold - Single-Leg Romanian Deadlift With Kettlebell  - 1 x daily - 7 x weekly - 3 sets - 10 reps - Side Stepping with Resistance at Thighs  - 1 x daily - 7 x weekly - 3 sets - 10 reps - Forward Monster Walk with Resistance at Ankles and Counter Support  - 1 x daily - 7 x weekly - 3 sets - 10 reps - Standard Plank  - 1 x daily - 7 x weekly - 1 sets - 10 reps - Prone Hip Extension  - 1 x daily - 7 x weekly - 3 sets - 10 reps - Side Plank on Knees  - 1 x daily - 7 x weekly - 1 sets - 10 reps - Sidelying Hip Abduction  - 1 x daily - 7 x weekly - 3 sets - 10 reps - Seated Hamstring Stretch  - 1 x daily - 7 x weekly - 1 sets - 2 reps - 1 min hold  ASSESSMENT:  CLINICAL IMPRESSION: Patient has been seen x 2 months for neck and back pain.   He saw better improvement with his neck pain than  he has with his back.   NDI score improved to 22%.    Modified ODI has improved to 32%.   Patient is independent with his home exercise program and is advised to continue with this daily as tolerated.  He is felt to be functioning at his maximum rehab potential with respect to his back pain.   It is likely that this will continue to flare up and improve cyclically due to his arthritis.   Flare ups are also related to provoking activities such as golf.   Discussed with him the importance of being in sufficient physical shape prior to participation in sports both from a stretching perspective and from a strength perspective.   He  still has weakness in his gluteals with both hip extension and hip abduction.  It is important for these muscles to be at a normal strength for him to play golf without risk of further injury.   His hamstring flexibility, especially the LLE, still needs to improve as well.   If his R sided LBP continues to be a limitation for him, then we would recommend he consider doing some type of injections with pain mgmt and possibly do some personal training at a local gym.  We will D/C PT at this time     OBJECTIVE IMPAIRMENTS: difficulty walking, decreased ROM, decreased strength, postural dysfunction, and pain.   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   REHAB POTENTIAL: Good  CLINICAL DECISION MAKING: Evolving/moderate complexity  EVALUATION COMPLEXITY: Moderate   GOALS: Goals reviewed with patient? Yes  SHORT TERM GOALS: Target date: 01/25/2024   Patient will be independent with initial HEP to improve outcomes and  carryover.  Baseline: 100% PT assist required for correct completion 01/04/24:  initial HEP for cervical pain initiated today 11/12:  progressing with initial HEP for cervical spine today Goal status: MET   2.  Patient will report 25% improvement in low back pain to improve QOL. Baseline: 5/10 02/21/24:  0/10 Goal status: MET  3.  Patient will report 50% subjective improvement in L sided cervical pain   Baseline:  02/21/24:  0/10  Goal status:  MET  LONG TERM GOALS: Target date: 02/22/2024   Patient will be independent with ongoing/advanced HEP for self-management at home.  Baseline: no advanced HEP yet 01/11/24:  advancing 12/23:  INDEPENDENT Goal status: MET  2.  Patient will report 50-75% improvement in low back pain to improve QOL.  Baseline: 5/10 02/01/24:  2/10 02/07/24:  4/10 today 02/14/24:  2/10 Goal status: MET  3.  Patient to demonstrate ability to achieve and maintain good spinal alignment/posturing and body mechanics needed for daily activities. Baseline: leftward lean with sway back 02/21/24:  still positions with some left lean and increased lordosis Goal status: IN PROGRESS  4.  Patient will demonstrate full pain free lumbar ROM to perform ADLs.   Baseline: Refer to above lumbar ROM table 02/07/24:  see updated tables Goal status: IN PROGRESS  5.  Patient will demonstrate improved BLE strength to >/= 5/5 for improved stability and ease of mobility. Baseline: Refer to above LE MMT table 02/07/24:  see updated tables Goal status: IN PROGRESS  6. Patient will report </= 20% on Modified Oswestry (MCID = 12%) to demonstrate improved functional ability with decreased pain interference. Baseline: 38% 02/01/24:  26% 02/21/24 = 32% Goal status: IN PROGRESS  7.  Patient will tolerate 30 min of (standing/sitting/walking) w/o increased pain to allow for  improved mobility and activity tolerance. Baseline: states pain begins after 10 min standing/walking 02/21/24:   variable depending on the day and activity level Goal status: IN PROGRESS   8.  Patient will demonstrate 60-80% cervical extension to be able to look up to do work around the house     Baseline:  see ROM tables above   Goal status:  INITIAL  (Not retested since patient wanted to focus remainder of visits on back pain)  9.  Patient will report >/= 75% improvement in neck pain    01/11/24:  2/10 02/10/24: 0/10 Goal status:  MET  10.  New Goal 02/01/24:  NDI score will improve to >/= 20% to reflect improved activity tolerance and QOL  Baseline:28%    02/21/24:  22% Goal status:  NOT MET  PLAN:  PT FREQUENCY: 1-2x/week  PT DURATION: 8 weeks  PLANNED INTERVENTIONS: 97164- PT Re-evaluation, 97110-Therapeutic exercises, 97530- Therapeutic activity, 97112- Neuromuscular re-education, 97535- Self Care, 02859- Manual therapy, [id number]- Electrical stimulation (unattended), 97035- Ultrasound, 02987- Traction (mechanical), [id number]- Ionotophoresis 4mg /ml Dexamethasone , 79439 (1-2 muscles), 20561 (3+ muscles)- Dry Needling, Patient/Family education, Balance training, Taping, Joint mobilization, Spinal mobilization, Cryotherapy, and Moist heat  PLAN FOR NEXT SESSION:  DC PT  PHYSICAL THERAPY DISCHARGE SUMMARY  Visits from Start of Care: 13  Current functional level related to goals / functional outcomes: Patient is independent with his home exercises and is to continue with them daily for management of his chronic back pain   Remaining deficits: Still has the R sided LBP, but it is responds well to aggressive stretching   Education / Equipment: Has a comprehensive HEP   Patient agrees to discharge. Patient goals were partially met. Patient is being discharged due to maximized rehab potential.       Josean Lycan, PT 02/21/2024, 9:46 PM  "

## 2024-02-22 ENCOUNTER — Other Ambulatory Visit (HOSPITAL_BASED_OUTPATIENT_CLINIC_OR_DEPARTMENT_OTHER): Payer: Self-pay

## 2024-02-22 DIAGNOSIS — G4733 Obstructive sleep apnea (adult) (pediatric): Secondary | ICD-10-CM

## 2024-02-22 NOTE — Telephone Encounter (Signed)
 Called and spoke with the pt. Pt states his current settings are 9cm H20 and this works well for him and is confused on why his settings are recommended to be 5-15.  Candis, can you please advise.  Pt is requesting any information be sent to his [facility] instead of calling the pt.

## 2024-03-08 ENCOUNTER — Encounter: Payer: Self-pay | Admitting: Family Medicine

## 2024-03-09 NOTE — Telephone Encounter (Signed)
 E mail sent

## 2024-03-12 ENCOUNTER — Encounter: Payer: Self-pay | Admitting: Podiatry

## 2024-03-15 ENCOUNTER — Ambulatory Visit (INDEPENDENT_AMBULATORY_CARE_PROVIDER_SITE_OTHER): Admitting: Podiatry

## 2024-03-15 ENCOUNTER — Encounter: Payer: Self-pay | Admitting: Podiatry

## 2024-03-15 ENCOUNTER — Ambulatory Visit

## 2024-03-15 DIAGNOSIS — M722 Plantar fascial fibromatosis: Secondary | ICD-10-CM

## 2024-03-15 DIAGNOSIS — M7731 Calcaneal spur, right foot: Secondary | ICD-10-CM

## 2024-03-15 MED ORDER — TRIAMCINOLONE ACETONIDE 40 MG/ML IJ SUSP
40.0000 mg | Freq: Once | INTRAMUSCULAR | Status: AC
  Administered 2024-03-15: 40 mg

## 2024-03-15 NOTE — Patient Instructions (Signed)

## 2024-03-15 NOTE — Progress Notes (Signed)
 Patient complains of pain on the plantar aspect of the heel on the right.  Mainly for stands up in the morning and after standing after sitting for a while.  Does not recall any injury to the foot. There is no complaint of any tenderness along the Achilles tendon.   Physical exam:  General appearance: Pleasant, and in no acute distress. AOx3.  Vascular: Pedal pulses: DP 2/4 bilaterally, PT 2/4 bilaterally.  Mild to moderate edema lower legs bilaterally. Capillary fill time immediate bilaterally  Neurological: Light touch intact feet bilaterally.  Normal Achilles reflex bilaterally.  No clonus or spasticity noted.  Negative Tinel sign tarsal tunnel and porta pedis bilaterally.  Dermatologic:   Skin normal temperature bilaterally.  Skin normal color, tone, and texture bilaterally.   Musculoskeletal: Tenderness plantar medial aspect of the heel at the medial plantar calcaneal tubercle and along the proximal plantar fascia right.  No fibromas noted.   No tenderness with lateral compression of calcaneus.  Posterior heel pain right.  Radiographs: 3 views right foot: Osteophytic changes plantar aspect calcaneus.  No evidence of any bone tumors.  No erosive changes around the calcaneus.  Normal bone density.  No ascending fractures or dislocations.  Diagnosis: 1.  Plantar fasciitis right 2.  Calcaneal spur right.   Plan: -Discussed etiology and treatment of plantar fasciitis.  Discussed proper supportive shoes.  Discussed things to avoid. -Dispensed written instructions for at home exercises and therapy. - Injected 3cc 2:1 mixture 0.5 cc Marcaine : Triamcinolone  40mg /19ml at medial origin of the plantar fascia band at the medial plantar calcaneal tubercle right.    Return 2 weeks follow-up injection plantar fascia right

## 2024-03-18 ENCOUNTER — Encounter: Payer: Self-pay | Admitting: Podiatry

## 2024-03-23 ENCOUNTER — Ambulatory Visit: Payer: Self-pay

## 2024-03-23 NOTE — Telephone Encounter (Signed)
 FYI Only or Action Required?: Action required by provider: clinical question for provider and update on patient condition.  Patient was last seen in primary care on 12/12/2023 by Dorina Loving, PA-C.  Called Nurse Triage reporting Dizziness.  Symptoms began yesterday.  Interventions attempted: Rest, hydration, or home remedies.  Symptoms are: gradually improving.  Triage Disposition: Home Care  Patient/caregiver understands and will follow disposition?: Yes     Message from [facility] sent at 03/23/2024  9:37 AM EST  Reason for Triage: dehydration, vertigo, balance, vomit.SABRA a bit better today      Reason for Disposition  Dizziness caused by not drinking enough fluids  Answer Assessment - Initial Assessment Questions Pt called in to report suspected dehydration after not drinking water yesterday and only drinking 2 cups of coffee. Pt reports dizziness with imbalance and vomiting. Pt reports he was unable to keep water down yesterday evening. Pt is urinating and urine is not dark, no foul odor. Pt is drink water at this time and able to keep fluids down, has ate a small bland breakfast of hard boiled egg and plain toast. Pt denies any continued dizziness, no h/a, no fever, no visual changes. Pt reports last emesis being early this morning but was just water he had tried to drink. Discussed importance of electrolytes especially since pt is not having any leg cramps, visual changes or h/a. Discussed continued slow hydration and avoiding chugging fluids. Pt states he will hold caffeine until he feels back to normal. Pt elects to do home care at this time, no appt scheduled.    1. DESCRIPTION: Describe your dizziness.     Off balance, weak   2. LIGHTHEADED: Do you feel lightheaded? (e.g., somewhat faint, woozy, weak upon standing)     Weak, off balance   3. VERTIGO: Do you feel like either you or the room is spinning or tilting? (i.e., vertigo)     No   4. SEVERITY: How bad  is it?  Do you feel like you are going to faint? Can you stand and walk?     Pt reports it is better at this time; reports worse yesterday   5. ONSET:  When did the dizziness begin?     Yesterday   6. AGGRAVATING FACTORS: Does anything make it worse? (e.g., standing, change in head position)     Standing   7. HEART RATE: Can you tell me your heart rate? How many beats in 15 seconds?  (Note: Not all patients can do this.)       Pt denies any heart racing   8. CAUSE: What do you think is causing the dizziness? (e.g., decreased fluids or food, diarrhea, emotional distress, heat exposure, new medicine, sudden standing, vomiting; unknown)     Pt suspects he was dehydrated from drinking 2 cups of coffee and no water while working yesterday   9. RECURRENT SYMPTOM: Have you had dizziness before? If Yes, ask: When was the last time? What happened that time?     Yes; pt states he has had vertigo spells in the past but denied any spinning or tilting with this event   10. OTHER SYMPTOMS: Do you have any other symptoms? (e.g., fever, chest pain, vomiting, diarrhea, bleeding)       Vomiting  Protocols used: Dizziness - Lightheadedness-A-AH

## 2024-04-02 ENCOUNTER — Telehealth: Payer: Self-pay

## 2024-04-02 NOTE — Telephone Encounter (Signed)
"  Appt scheduled tomorrow  "

## 2024-04-03 ENCOUNTER — Encounter: Payer: Self-pay | Admitting: Family Medicine

## 2024-04-03 ENCOUNTER — Ambulatory Visit: Admitting: Family Medicine

## 2024-04-03 VITALS — BP 132/76 | HR 70 | Temp 98.0°F | Resp 16 | Ht 73.0 in | Wt 252.0 lb

## 2024-04-03 DIAGNOSIS — E782 Mixed hyperlipidemia: Secondary | ICD-10-CM

## 2024-04-03 DIAGNOSIS — E669 Obesity, unspecified: Secondary | ICD-10-CM

## 2024-04-03 DIAGNOSIS — E119 Type 2 diabetes mellitus without complications: Secondary | ICD-10-CM | POA: Diagnosis not present

## 2024-04-03 LAB — LIPID PANEL
Cholesterol: 118 mg/dL (ref 28–200)
HDL: 41.4 mg/dL
LDL Cholesterol: 51 mg/dL (ref 10–99)
NonHDL: 77.05
Total CHOL/HDL Ratio: 3
Triglycerides: 132 mg/dL (ref 10.0–149.0)
VLDL: 26.4 mg/dL (ref 0.0–40.0)

## 2024-04-03 LAB — CBC
HCT: 44.7 % (ref 39.0–52.0)
Hemoglobin: 15.2 g/dL (ref 13.0–17.0)
MCHC: 33.9 g/dL (ref 30.0–36.0)
MCV: 85.6 fl (ref 78.0–100.0)
Platelets: 157 10*3/uL (ref 150.0–400.0)
RBC: 5.23 Mil/uL (ref 4.22–5.81)
RDW: 13.8 % (ref 11.5–15.5)
WBC: 9 10*3/uL (ref 4.0–10.5)

## 2024-04-03 LAB — COMPREHENSIVE METABOLIC PANEL WITH GFR
ALT: 22 U/L (ref 3–53)
AST: 18 U/L (ref 5–37)
Albumin: 4.8 g/dL (ref 3.5–5.2)
Alkaline Phosphatase: 41 U/L (ref 39–117)
BUN: 15 mg/dL (ref 6–23)
CO2: 29 meq/L (ref 19–32)
Calcium: 9.5 mg/dL (ref 8.4–10.5)
Chloride: 100 meq/L (ref 96–112)
Creatinine, Ser: 1.07 mg/dL (ref 0.40–1.50)
GFR: 70.49 mL/min
Glucose, Bld: 99 mg/dL (ref 70–99)
Potassium: 4.6 meq/L (ref 3.5–5.1)
Sodium: 135 meq/L (ref 135–145)
Total Bilirubin: 1.7 mg/dL — ABNORMAL HIGH (ref 0.2–1.2)
Total Protein: 6.9 g/dL (ref 6.0–8.3)

## 2024-04-03 LAB — HEMOGLOBIN A1C: Hgb A1c MFr Bld: 6.8 % — ABNORMAL HIGH (ref 4.6–6.5)

## 2024-04-03 NOTE — Patient Instructions (Signed)
Give [place] 2-3 business days to get the results of your labs back.   Keep the diet clean and stay active.  Try to drink 55-60 oz of water daily outside of exercise.  Let us know if you need anything.

## 2024-04-04 ENCOUNTER — Ambulatory Visit: Payer: Self-pay | Admitting: Family Medicine

## 2024-05-03 ENCOUNTER — Ambulatory Visit: Admitting: Podiatry
